# Patient Record
Sex: Male | Born: 1950
Health system: Southern US, Community
[De-identification: ages and names within clinical notes are randomized; demographics above are authoritative.]

## PROBLEM LIST (undated history)

## (undated) DIAGNOSIS — E785 Hyperlipidemia, unspecified: Secondary | ICD-10-CM

## (undated) DIAGNOSIS — F909 Attention-deficit hyperactivity disorder, unspecified type: Secondary | ICD-10-CM

## (undated) DIAGNOSIS — N289 Disorder of kidney and ureter, unspecified: Secondary | ICD-10-CM

## (undated) DIAGNOSIS — R569 Unspecified convulsions: Secondary | ICD-10-CM

## (undated) DIAGNOSIS — I10 Essential (primary) hypertension: Secondary | ICD-10-CM

## (undated) DIAGNOSIS — I639 Cerebral infarction, unspecified: Secondary | ICD-10-CM

## (undated) HISTORY — DX: Attention-deficit hyperactivity disorder, unspecified type: F90.9

## (undated) HISTORY — DX: Hyperlipidemia, unspecified: E78.5

## (undated) HISTORY — PX: SPINE SURGERY: SHX786

## (undated) HISTORY — PX: NECK SURGERY: SHX720

## (undated) HISTORY — DX: Unspecified convulsions: R56.9

---

## 1998-01-19 ENCOUNTER — Ambulatory Visit (HOSPITAL_COMMUNITY): Admission: RE | Admit: 1998-01-19 | Discharge: 1998-01-19 | Payer: Self-pay | Admitting: Otolaryngology

## 2002-01-05 ENCOUNTER — Encounter: Admission: RE | Admit: 2002-01-05 | Discharge: 2002-04-05 | Payer: Self-pay | Admitting: Internal Medicine

## 2002-03-09 ENCOUNTER — Encounter: Payer: Self-pay | Admitting: Emergency Medicine

## 2002-03-09 ENCOUNTER — Inpatient Hospital Stay (HOSPITAL_COMMUNITY): Admission: EM | Admit: 2002-03-09 | Discharge: 2002-03-11 | Payer: Self-pay | Admitting: Emergency Medicine

## 2002-05-11 ENCOUNTER — Ambulatory Visit (HOSPITAL_COMMUNITY): Admission: RE | Admit: 2002-05-11 | Discharge: 2002-05-11 | Payer: Self-pay | Admitting: Gastroenterology

## 2002-05-11 ENCOUNTER — Encounter (INDEPENDENT_AMBULATORY_CARE_PROVIDER_SITE_OTHER): Payer: Self-pay

## 2006-02-22 ENCOUNTER — Ambulatory Visit: Payer: Self-pay

## 2010-01-03 ENCOUNTER — Ambulatory Visit (HOSPITAL_COMMUNITY): Admission: RE | Admit: 2010-01-03 | Discharge: 2010-01-03 | Payer: Self-pay | Admitting: Sports Medicine

## 2010-10-20 NOTE — Discharge Summary (Signed)
   NAME:  Jeff [name], Jeff [name]                              ACCOUNT NO.:  [id number]   MEDICAL RECORD NO.:  [id number]                   PATIENT TYPE:  INP   LOCATION:  0379                                 FACILITY:  [facility]   PHYSICIAN:  Theressa Millard, M.D.                 DATE OF BIRTH:  24-Sep-1950   DATE OF ADMISSION:  03/09/2002  DATE OF DISCHARGE:  03/11/2002                                 DISCHARGE SUMMARY   ADMISSION DIAGNOSIS:  Syncope, possibly secondary to Flagyl.   DISCHARGE DIAGNOSES:  1. Syncope.  2. Diabetes mellitus.  3. Diverticulitis of the colon.   HISTORY OF PRESENT ILLNESS:  Please see the patient's history and physical  examination dated March 09, 2002.  Briefly, the patient had recurrent  episodes of syncope on the day of admission.   HOSPITAL COURSE:  The patient was admitted and was placed on telemetry.  During this hospitalization he had absolutely no evidence of ectopy.  He was  seen in consultation by Dr. Candace Cruise, who recommended the patient undergo  thoracic echo, serial cardiac enzymes, carotid duplex, and consideration of  tilt-table testing.  The laboratory data for evidence of myocardial ischemia  was negative.  Carotid Dopplers were negative.  Transthoracic echo was  entirely normal.  He was transferred [facility] where he underwent tilt-  table testing, at which time his symptoms could not be reproduced.  Therefore, it was a negative tilt-table test.  Upon further evaluation, I  was able to find evidence that Flagyl had been associated with episodes of  syncope.  This is a very rare side effects.  The patient had been placed on  Flagyl and Cipro for presumed diverticulitis just prior to admission.   CONDITION ON DISCHARGE:  Improved.   DISCHARGE MEDICATIONS:  Complete Cipro 500 mg b.i.d. x 3 days.   FOLLOW-UP:  He will call to make an appointment to see me in the office.   ACTIVITY:  As tolerated.  He is not to drive for a period of three weeks,  and then he may resume driving if he has no recurring episodes.                                                Theressa Millard, M.D.    JO/MEDQ  D:  04/29/2002  T:  04/29/2002  Job:  [id number]

## 2010-10-20 NOTE — H&P (Signed)
NAME:  Jeff [name], Jeff [name]                              ACCOUNT NO.:  [id number]   MEDICAL RECORD NO.:  [id number]                   PATIENT TYPE:  INP   LOCATION:  0375                                 FACILITY:  [facility]   PHYSICIAN:  Theressa Millard, M.D.                 DATE OF BIRTH:  02/01/1951   DATE OF ADMISSION:  03/09/2002  DATE OF DISCHARGE:                                HISTORY & PHYSICAL   HISTORY OF PRESENT ILLNESS:  The patient is a 60 year old white male  admitted with syncope.  The history is obtained from the patient, his wife,  and the emergency department physician.   He has recently been diagnosed with diabetes.  He is currently on Amaryl  daily.  He also has had two bouts of diverticulitis in the last few months,  the last one starting about five days prior to admission.  Both times he has  been treated with Cipro and Flagyl, and he is thus five days through a seven  day course of Cipro and Flagyl.   He felt fine today.  He was driving to work and was about a block from his  home when he felt light-headed.  He returned home and remembers unlocking  the door.  The next thing he remembers, his dog was licking his face.  He  got up and called his boss, and told her his neighbor's name.  His neighbor  came over and found his unconscious on the floor.  The next thing he  remembers, he is sitting in his recliner with a number of fireman around  him.  No seizure activity noted.  He was brought to the [facility]  Emergency Department.   While in the ED, his wife was sitting with him.  He was asleep.  She saw his  pulse go down to 42 and called the nurse.  When the nurse awoke the patient,  he was not able to talk, but he could move his extremities.  All those  symptoms resolved.  Head CT was negative.  During the episode in the ER,  systolic blood pressure was noted to be in the 80's, usually 105 to 115.  He  does not recall any of this episode either.  CBG's during  spell were 100 to  150.   PAST MEDICAL HISTORY:  1. Diverticulitis.  2. Hypercholesterolemia.  3. Charcot radiculopathy.  4. Male pattern baldness.   PAST SURGICAL HISTORY:  Cervical laminectomy x2.   MEDICATIONS:  1. Amaryl 1 mg q.d.  2. Cipro 500 mg b.i.d.  3. Flagyl 250 mg t.i.d. x7 days.   ALLERGIES:  NOVOCAINE.   SOCIAL HISTORY:  He does smoke.  He does not consume alcohol.  He is a  Psychologist, occupational.  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  He also  referees football.  He is  married.   FAMILY HISTORY:  Father had hypertension and prostate problems.  Mother died  of heart failure, strokes, and had glaucoma and diabetes.  Two brothers have  had diabetes.  One of those brothers died with a brain tumor.   REVIEW OF SYMPTOMS:  All other systems are negative.   PHYSICAL EXAMINATION:  GENERAL:  Well-developed, well-nourished, in no  distress.  VITAL SIGNS:  Blood pressure 110/70, pulse 72 and regular.  HEENT:  Pupils equal, round, reactive to light.  Extraocular movements were  intact.  Funduscopic examination was unremarkable.  Ears were normal.  Nose  and throat were normal.  NECK:  Supple.  CHEST:  Clear to auscultation and percussion.  CARDIAC:  Regular rate and rhythm with a normal S1 and S2, without a S3, S4,  murmur, rub, or click.  ABDOMEN:  Soft, minimal left lower quadrant tenderness.  EXTREMITIES:  Without cyanosis, clubbing, or edema.   LABORATORY DATA:  CMET was normal except for a blood sugar of 123.  CBC was  normal.  Chest x-ray was normal.  EKG was normal.  Head CT verbal report was  negative.   IMPRESSION:  1. Syncope x3.  Differential diagnoses is a primary bradycardic event versus     neurocardiogenic syncope versus acute ischemia versus side effect to     Flagyl (listed as a potential side effect, but I doubt it is the case).     We will ask for cardiovascular consultation.  We will keep the patient on     telemetry.  2. Diverticulitis.  This  is improving.  I had considered getting a CT scan,     and may decide to get that while he is in the hospital.  We will hold     Flagyl for now.  3. Diabetes mellitus.  Hold Amaryl and follow CBG's.                                               Theressa Millard, M.D.    JO/MEDQ  D:  03/09/2002  T:  03/10/2002  Job:  [id number]

## 2010-10-20 NOTE — Op Note (Signed)
NAME:  Jeff [name], Jeff [name]                              ACCOUNT NO.:  [id number]   MEDICAL RECORD NO.:  [id number]                   PATIENT TYPE:  AMB   LOCATION:  ENDO                                 FACILITY:  [facility]   PHYSICIAN:  Danise Edge, M.D.                DATE OF BIRTH:  1950-10-19   DATE OF PROCEDURE:  05/11/2002  DATE OF DISCHARGE:                                 OPERATIVE REPORT   PROCEDURE:  Colonoscopy and polypectomy.   INDICATIONS FOR PROCEDURE:  [name] is a 60 year old male born 08/17/1950. [name] has completed his course of antibiotic therapy for acute  diverticulitis manifested by severe left lower quadrant abdominal pain. His  symptoms of diverticulitis have resolved. He is scheduled to undergo a  diagnostic colonoscopy with polypectomy to prevent colon cancer.   ENDOSCOPIST:  Charolett Bumpers, M.D.   PREMEDICATION:  Versed 5 mg, Demerol 50 mg .   ENDOSCOPE:  Olympus pediatric colonoscope.   DESCRIPTION OF PROCEDURE:  After obtaining informed consent, Jeff [name] was  placed in the left lateral decubitus position. I administered intravenous  Demerol and intravenous Versed to achieve conscious sedation for the  procedure. The patient's blood pressure, oxygen saturation and cardiac  rhythm were monitored throughout the procedure and documented in the medical  record.   Anal inspection was normal. Digital rectal exam revealed a nonnodular  prostate. The Olympus pediatric video colonoscope was introduced into the  rectum and advanced to the cecum. Colonic preparation for the exam today was  excellent.   RECTUM:  Normal.   SIGMOID COLON AND DESCENDING COLON:  At approximately 60 cm from the anal  verge, a 2 mm sessile polyp was removed with the electrocautery snare. I did  not detect diverticulosis involving the left colon but colonoscopy is the  best way to demonstrate colonic diverticulosis.   SPLENIC FLEXURE:  Normal.   TRANSVERSE COLON:   Normal.   HEPATIC FLEXURE:  Normal.   ASCENDING COLON:  From the mid ascending colon, a 2 mm sessile polyp was  removed with electrocautery snare.   CECUM AND ILEOCECAL VALVE:  Normal.   ASSESSMENT:  1. Resolved acute diverticulitis.  2. A 2 mm sessile polyp was removed from the mid ascending colon and a 2 mm     sessile polyp was removed from the descending colon. Both polyps were     submitted in one bottle for pathological evaluation.   RECOMMENDATIONS:  If polyps return neoplastic pathologically, Jeff [name]  should undergo a repeat colonoscopy in five years.                                               Danise Edge, M.D.    MJ/MEDQ  D:  05/11/2002  T:  05/11/2002  Job:  [id number]   cc:   Theressa Millard, M.D.  [address] [place]  [place] [address]  Fax: [phone]

## 2010-10-20 NOTE — Consult Note (Signed)
NAME:  [name], Jeff                              ACCOUNT NO.:  [id number]   MEDICAL RECORD NO.:  [id number]                   PATIENT TYPE:  INP   LOCATION:  0102                                 FACILITY:  [facility]   PHYSICIAN:  Meade Maw, M.D.                 DATE OF BIRTH:  [birth year]   DATE OF CONSULTATION:  DATE OF DISCHARGE:                                   CONSULTATION   REASON FOR CONSULTATION:  Syncope.   HISTORY OF PRESENT ILLNESS:  Jeff [name] is a very pleasant 60 year old  gentleman who awoke this morning still in his usual state of health, started  off to work and was proceeding to the nursing home where he was dealing with  some issues for his father when he suddenly felt not well, somewhat light  headed and dizzy. He subsequently drove home, called his boss and reported  not feeling well. He unlocked his front door which is unusual for him and  subsequently found himself laying on the floor with the dog licking his ear.  He then notified his boss who notified the neighbors who in turn notified  the EMS. When the EMS arrived on the scene, he was noted to have a blood  pressure in the 150s with a heart rate of 92, this was following a sternal  rub. Following the sternal rub, he became alert, oriented, and conversation  was appropriate. He was able to relate his history to the EMS service. He  subsequently presented to the emergency room and had one further event where  he was somewhat nonresponsive but was dysarthric and at this time he was  noted to have a systolic blood pressure of 87 with a heart rate of 48 beats  per minute. He has had no prior presyncopal episodes. 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 He walks on his treadmill for 40 minutes a day after being  given the diagnosis of diabetes. There was no seizure activity, no bowel or  bladder incontinence noted.   PAST MEDICAL HISTORY:  1.  Significant for diverticulitis recently diagnosed, started on  Flagyl and     Cipro approximately four days prior to this presentation.  2. Adult onset diabetes mellitus.   PAST SURGICAL HISTORY:  Significant for a laminectomy x2.   CURRENT MEDICATIONS:  1. Cipro 500 mg b.i.d.  2. Flagyl 250 mg t.i.d.  3. Amaryl 1 mg daily.   ALLERGIES:  NOVOCAIN.   SOCIAL HISTORY:  He is married, lives with his wife. No history of tobacco,  alcohol or illicit drug use.   FAMILY HISTORY:  Significant for brothers with diabetes and coronary artery  disease. Mother passed at age 34 from CVA and myocardial infarction. Father  is 73 and has adult onset diabetes with kidney problems.  REVIEW OF SYMPTOMS:  He has had no chest pain, no palpitations, no  tachyarrhythmias, no orthopnea, no pedal edema, no unusual dyspnea.   PHYSICAL EXAMINATION:  GENERAL:  Reveals a middle-aged male in no acute  distress.   VITAL SIGNS:  Blood pressure currently is 108 with a heart rate in the 50s.   HEENT:  Unremarkable.  He has good carotid upstrokes. There is no carotid  bruit. His thyroid is not palpable.   PULMONARY:  Reveals breath sounds which are equal and clear to auscultation.   CARDIOVASCULAR:  Reveals a regular rate and rhythm, no rubs, murmurs or  gallops as noted.   ABDOMEN:  Soft, benign, nontender. No unusual bruits or pulsations are  noted.   EXTREMITIES:  Reveal no peripheral edema. Distal pulses are equal and  palpable.   SKIN:  Warm and dry.   NEUROLOGIC:  Nonfocal.   LABORATORY DATA:  CT scan of the head was normal. Chest x-ray revealed no  acute disease. ECG revealing normal sinus rhythm, normal ECG.   CBC reveals a white count of 4.7, hemoglobin 15, hematocrit 44, platelet  count 254, normal electrolytes, creatinine 1.3, CK 104, CK-MB 1.1, troponin  I 0.03.   IMPRESSION:  Recurrent syncope in a 60 year old gentleman without past  medical history of cardiac disease who presented to  the emergency room, his  initial cardiac enzymes are normal. His physical examination is  unremarkable. Do not feel that a systolic blood pressure is 87 with a heart  rate of 47 is sufficient to account for his symptoms. Agreeable observation  and admitting to telemetry is warranted. Will obtain serial cardiac enzymes  as well as a transthoracic echo and carotid duplex to complete the workup.  Strongly suspect the symptoms may be related to his adverse reaction of  Flagyl. This is listed as a side effect. Should the blood test be negative,  a stress Cardiolite may be performed as an outpatient for further cardiac  workup.                                               Meade Maw, M.D.    HP/MEDQ  D:  03/09/2002  T:  03/09/2002  Job:  [id number]

## 2011-05-30 ENCOUNTER — Emergency Department (INDEPENDENT_AMBULATORY_CARE_PROVIDER_SITE_OTHER): Payer: BC Managed Care – PPO

## 2011-05-30 ENCOUNTER — Encounter: Payer: Self-pay | Admitting: Student

## 2011-05-30 ENCOUNTER — Emergency Department (HOSPITAL_BASED_OUTPATIENT_CLINIC_OR_DEPARTMENT_OTHER)
Admission: EM | Admit: 2011-05-30 | Discharge: 2011-05-30 | Disposition: A | Discharge status: Home / Self Care | Payer: BC Managed Care – PPO | Attending: Emergency Medicine | Admitting: Emergency Medicine

## 2011-05-30 ENCOUNTER — Other Ambulatory Visit: Payer: Self-pay

## 2011-05-30 DIAGNOSIS — R059 Cough, unspecified: Secondary | ICD-10-CM | POA: Insufficient documentation

## 2011-05-30 DIAGNOSIS — J189 Pneumonia, unspecified organism: Secondary | ICD-10-CM

## 2011-05-30 DIAGNOSIS — Z79899 Other long term (current) drug therapy: Secondary | ICD-10-CM | POA: Insufficient documentation

## 2011-05-30 DIAGNOSIS — I10 Essential (primary) hypertension: Secondary | ICD-10-CM | POA: Insufficient documentation

## 2011-05-30 DIAGNOSIS — R05 Cough: Secondary | ICD-10-CM | POA: Insufficient documentation

## 2011-05-30 DIAGNOSIS — J111 Influenza due to unidentified influenza virus with other respiratory manifestations: Secondary | ICD-10-CM | POA: Insufficient documentation

## 2011-05-30 DIAGNOSIS — R0602 Shortness of breath: Secondary | ICD-10-CM | POA: Insufficient documentation

## 2011-05-30 DIAGNOSIS — E119 Type 2 diabetes mellitus without complications: Secondary | ICD-10-CM | POA: Insufficient documentation

## 2011-05-30 HISTORY — DX: Essential (primary) hypertension: I10

## 2011-05-30 LAB — BASIC METABOLIC PANEL
BUN: 18 mg/dL (ref 6–23)
CO2: 23 mEq/L (ref 19–32)
Chloride: 96 mEq/L (ref 96–112)
Creatinine, Ser: 1.1 mg/dL (ref 0.50–1.35)
Potassium: 4.3 mEq/L (ref 3.5–5.1)

## 2011-05-30 LAB — CBC
Hemoglobin: 13.7 g/dL (ref 13.0–17.0)
MCH: 28.2 pg (ref 26.0–34.0)
RBC: 4.86 MIL/uL (ref 4.22–5.81)
WBC: 7.2 10*3/uL (ref 4.0–10.5)

## 2011-05-30 LAB — GLUCOSE, CAPILLARY: Glucose-Capillary: 238 mg/dL — ABNORMAL HIGH (ref 70–99)

## 2011-05-30 MED ORDER — SODIUM CHLORIDE 0.9 % IV SOLN: INTRAVENOUS | Status: DC

## 2011-05-30 MED ORDER — MOXIFLOXACIN HCL 400 MG PO TABS: 400.0000 mg | ORAL_TABLET | Freq: Every day | ORAL | Status: AC

## 2011-05-30 MED ORDER — ACETAMINOPHEN 325 MG PO TABS: 650.0000 mg | ORAL_TABLET | Freq: Once | ORAL | Status: DC

## 2011-05-30 MED ORDER — SODIUM CHLORIDE 0.9 % IV BOLUS (SEPSIS)
250.0000 mL | Freq: Once | INTRAVENOUS | Status: AC
  Administered 2011-05-30: 250 mL via INTRAVENOUS

## 2011-05-30 MED ORDER — ACETAMINOPHEN 500 MG PO TABS
1000.0000 mg | ORAL_TABLET | Freq: Once | ORAL | Status: AC
  Administered 2011-05-30: 1000 mg via ORAL

## 2011-05-30 MED ORDER — AZITHROMYCIN 250 MG PO TABS
500.0000 mg | ORAL_TABLET | Freq: Once | ORAL | Status: AC
  Administered 2011-05-30: 500 mg via ORAL
  Filled 2011-05-30: qty 2

## 2011-05-30 MED ORDER — ACETAMINOPHEN 500 MG PO TABS
ORAL_TABLET | ORAL | Status: AC
  Filled 2011-05-30: qty 2

## 2011-05-30 MED ORDER — DEXTROSE 5 % IV SOLN
1.0000 g | Freq: Once | INTRAVENOUS | Status: AC
  Administered 2011-05-30: 1 g via INTRAVENOUS
  Filled 2011-05-30: qty 10

## 2011-05-30 MED ORDER — OSELTAMIVIR PHOSPHATE 75 MG PO CAPS: 75.0000 mg | ORAL_CAPSULE | Freq: Two times a day (BID) | ORAL | Status: AC

## 2011-05-30 NOTE — ED Notes (Signed)
Pt in with c/o extreme SOB wit associated cough and upper chest congestion x 2 days. Pt placed in wheelchair upon arrival and reports feeling very weak and tired. Recent familial exposure to URI. Flu shot +.

## 2011-05-30 NOTE — ED Provider Notes (Signed)
History     CSN: [id number]  Arrival date & time 05/30/11  1206   First MD Initiated Contact with Patient 05/30/11 1207      Chief Complaint  Patient presents with  . Shortness of Breath  . Cough    (Consider location/radiation/quality/duration/timing/severity/associated sxs/prior treatment) The history is provided by the patient.   patient is a 60 year old male has history of diabetes and hypertension. Acute onset yesterday of cough bodyaches fever chest congestion, fatigue, patient was placed in wheelchair upon arrival because he was feeling very weak and tired thought maybe is going to pass out. Patient didt have flu shot this year.  No nausea vomiting or diarrhea.   Past Medical History  Diagnosis Date  . Diabetes mellitus   . Hypertension     Past Surgical History  Procedure Date  . Neck surgery     History reviewed. No pertinent family history.  History  Substance Use Topics  . Smoking status: Never Smoker   . Smokeless tobacco: Not on file  . Alcohol Use: No      Review of Systems  Constitutional: Positive for fever and fatigue.  HENT: Positive for congestion. Negative for neck pain and neck stiffness.   Eyes: Negative for redness.  Respiratory: Positive for cough and shortness of breath. Negative for chest tightness.   Cardiovascular: Negative for chest pain.  Gastrointestinal: Negative for nausea, vomiting, abdominal pain and diarrhea.  Genitourinary: Negative for dysuria and hematuria.  Musculoskeletal: Positive for myalgias. Negative for back pain.  Skin: Negative for rash.  Neurological: Positive for weakness. Negative for headaches.  Hematological: Does not bruise/bleed easily.  Psychiatric/Behavioral: Positive for confusion.    Allergies  Flexeril  Home Medications   Current Outpatient Rx  Name Route Sig Dispense Refill  . GLIPIZIDE 5 MG PO TABS Oral Take 5 mg by mouth 2 (two) times daily before a meal.      . LISINOPRIL 10 MG PO TABS  Oral Take 10 mg by mouth daily.      Marland Kitchen METFORMIN HCL 500 MG PO TABS Oral Take 500 mg by mouth 2 (two) times daily with a meal.      . SITAGLIPTIN PHOSPHATE 25 MG PO TABS Oral Take 25 mg by mouth daily.      Marland Kitchen MOXIFLOXACIN HCL 400 MG PO TABS Oral Take 1 tablet (400 mg total) by mouth daily. 7 tablet 0  . OSELTAMIVIR PHOSPHATE 75 MG PO CAPS Oral Take 1 capsule (75 mg total) by mouth every 12 (twelve) hours. 10 capsule 0    BP 120/72  Pulse 106  Temp(Src) 101.5 F (38.6 C) (Oral)  Resp 24  Wt 165 lb (74.844 kg)  SpO2 100%  Physical Exam  Nursing note and vitals reviewed. Constitutional: He appears well-developed and well-nourished. He appears distressed.  HENT:  Head: Normocephalic and atraumatic.  Mouth/Throat: Oropharynx is clear and moist.  Eyes: Conjunctivae and EOM are normal. Pupils are equal, round, and reactive to light.  Neck: Normal range of motion. Neck supple.  Cardiovascular: Normal rate, regular rhythm and normal heart sounds.   No murmur heard.      Tachycardic  Pulmonary/Chest: Breath sounds normal. He is in respiratory distress. He has no wheezes. He has no rales. He exhibits no tenderness.  Abdominal: Soft. Bowel sounds are normal. There is no tenderness.  Musculoskeletal: Normal range of motion. He exhibits no edema and no tenderness.  Neurological: No cranial nerve deficit. He exhibits normal muscle tone. Coordination normal.  Somnolent  Skin: Skin is warm. No rash noted.    ED Course  Procedures (including critical care time)  Labs Reviewed  BASIC METABOLIC PANEL - Abnormal; Notable for the following:    Sodium 133 (*)    Glucose, Bld 231 (*)    GFR calc non Af Amer 70 (*)    GFR calc Af Amer 81 (*)    All other components within normal limits  GLUCOSE, CAPILLARY - Abnormal; Notable for the following:    Glucose-Capillary 238 (*)    All other components within normal limits  CBC  POCT CBG MONITORING   Dg Chest Portable 1 View  05/30/2011   *RADIOLOGY REPORT*  Clinical Data: Cough and short of breath  PORTABLE CHEST - 1 VIEW  Comparison: None.  Findings: Image quality degraded by patient motion during the exposure.  Mild left lower lobe airspace density may represent atelectasis or pneumonia.  Negative for heart failure or effusion.  IMPRESSION: Mild left lower lobe airspace disease.  Original Report Authenticated By: Camelia Phenes, M.D.      Date: 05/30/2011  Rate: 131  Rhythm: sinus tachycardia  QRS Axis: normal  Intervals: normal  ST/T Wave abnormalities: normal  Conduction Disutrbances:none  Narrative Interpretation:   Old EKG Reviewed: none available  Results for orders placed during the hospital encounter of 05/30/11  BASIC METABOLIC PANEL      Component Value Range   Sodium 133 (*) 135 - 145 (mEq/L)   Potassium 4.3  3.5 - 5.1 (mEq/L)   Chloride 96  96 - 112 (mEq/L)   CO2 23  19 - 32 (mEq/L)   Glucose, Bld 231 (*) 70 - 99 (mg/dL)   BUN 18  6 - 23 (mg/dL)   Creatinine, Ser 2.95  0.50 - 1.35 (mg/dL)   Calcium 9.4  8.4 - 28.4 (mg/dL)   GFR calc non Af Amer 70 (*) >90 (mL/min)   GFR calc Af Amer 81 (*) >90 (mL/min)  GLUCOSE, CAPILLARY      Component Value Range   Glucose-Capillary 238 (*) 70 - 99 (mg/dL)   Comment 1 Notify RN    CBC      Component Value Range   WBC 7.2  4.0 - 10.5 (K/uL)   RBC 4.86  4.22 - 5.81 (MIL/uL)   Hemoglobin 13.7  13.0 - 17.0 (g/dL)   HCT 13.2  44.0 - 10.2 (%)   MCV 80.9  78.0 - 100.0 (fL)   MCH 28.2  26.0 - 34.0 (pg)   MCHC 34.9  30.0 - 36.0 (g/dL)   RDW 72.5  36.6 - 44.0 (%)   Platelets 204  150 - 400 (K/uL)     1. Influenza   2. CAP (community acquired pneumonia)     CRITICAL CARE Performed by: Shelda Jakes.   Total critical care time: 30  Critical care time was exclusive of separately billable procedures and treating other patients.  Critical care was necessary to treat or prevent imminent or life-threatening deterioration.  Critical care was time spent  personally by me on the following activities: development of treatment plan with patient and/or surrogate as well as nursing, discussions with consultants, evaluation of patient's response to treatment, examination of patient, obtaining history from patient or surrogate, ordering and performing treatments and interventions, ordering and review of laboratory studies, ordering and review of radiographic studies, pulse oximetry and re-evaluation of patient's condition.   MDM   Patient arrived with a persistent cough and near-syncope. Acute onset of flulike  symptoms yesterday with fever significant cough congestion body aches. Patient did have flu shot this year. When initially brought back to room patient's oxygen saturation was 92-93% on room air. He was somewhat delirious. Temperature was 103. Chest x-ray reveals a early left-sided pneumonia. Other symptoms are truly consistent with influenza-type illness. Patient was placed on 2 L of oxygen nausea and sats came up to 99 percent. Given IV bolus 250 cc of normal saline and then IV hydration a rate of 100 cc per hour. For the pneumonia which be a community-acquired pneumonia patient has not been in the hospital at all for admission in the last 3 months, patient received IV Rocephin 1 g and by mouth Zithromax. She has a history of diabetes which he takes oral meds for a history of hypertension. Blood sugar here was 2:30 red, no acidosis.  With fluids antibiotics patient had significant improvement, in addition 1 g of Tylenol for the fever. Patient now alert talking normal temperature down, cough significant improved. Patient now clinically well enough to go home oxygen saturation on room air is 96-97%. Patient will be started on Tamiflu and switched over to Avelox for antibiotics as he has diabetes. Patient will return for any new worse symptoms.        Shelda Jakes, MD 05/30/11 [phone]

## 2011-08-28 ENCOUNTER — Telehealth: Payer: Self-pay

## 2011-08-28 NOTE — Telephone Encounter (Signed)
Pt requesting refill on his vyvanse

## 2011-08-29 NOTE — Telephone Encounter (Signed)
Need chart

## 2011-08-30 MED ORDER — LISDEXAMFETAMINE DIMESYLATE 70 MG PO CAPS: 70.0000 mg | ORAL_CAPSULE | ORAL | Status: DC

## 2011-08-30 NOTE — Telephone Encounter (Signed)
Signed at TL desk - remind pt he needs OV 4/13

## 2011-08-30 NOTE — Telephone Encounter (Signed)
Called pt advised wife RX ready to pick up

## 2011-08-30 NOTE — Telephone Encounter (Signed)
Chart pulled to PA 

## 2011-09-25 ENCOUNTER — Other Ambulatory Visit: Payer: Self-pay | Admitting: Internal Medicine

## 2011-10-14 ENCOUNTER — Ambulatory Visit (INDEPENDENT_AMBULATORY_CARE_PROVIDER_SITE_OTHER): Payer: BC Managed Care – PPO | Admitting: Internal Medicine

## 2011-10-14 VITALS — BP 109/69 | HR 92 | Temp 98.1°F | Resp 16 | Ht 68.25 in | Wt 187.6 lb

## 2011-10-14 DIAGNOSIS — F988 Other specified behavioral and emotional disorders with onset usually occurring in childhood and adolescence: Secondary | ICD-10-CM

## 2011-10-14 DIAGNOSIS — E785 Hyperlipidemia, unspecified: Secondary | ICD-10-CM | POA: Insufficient documentation

## 2011-10-14 DIAGNOSIS — Z6828 Body mass index (BMI) 28.0-28.9, adult: Secondary | ICD-10-CM | POA: Insufficient documentation

## 2011-10-14 DIAGNOSIS — Z77098 Contact with and (suspected) exposure to other hazardous, chiefly nonmedicinal, chemicals: Secondary | ICD-10-CM | POA: Insufficient documentation

## 2011-10-14 DIAGNOSIS — Z7739 Contact with and (suspected) exposure to other war theater: Secondary | ICD-10-CM | POA: Insufficient documentation

## 2011-10-14 DIAGNOSIS — E119 Type 2 diabetes mellitus without complications: Secondary | ICD-10-CM

## 2011-10-14 DIAGNOSIS — H9191 Unspecified hearing loss, right ear: Secondary | ICD-10-CM | POA: Insufficient documentation

## 2011-10-14 LAB — POCT CBC
MCH, POC: 29 pg (ref 27–31.2)
MCHC: 34.1 g/dL (ref 31.8–35.4)
MCV: 85 fL (ref 80–97)
MID (cbc): 0.6 (ref 0–0.9)
MPV: 9.5 fL (ref 0–99.8)
POC LYMPH PERCENT: 23.2 %L (ref 10–50)
POC MID %: 6.4 %M (ref 0–12)
Platelet Count, POC: 246 10*3/uL (ref 142–424)
RBC: 4.66 M/uL — AB (ref 4.69–6.13)
RDW, POC: 13.6 %
WBC: 9 10*3/uL (ref 4.6–10.2)

## 2011-10-14 MED ORDER — LISDEXAMFETAMINE DIMESYLATE 70 MG PO CAPS: 70.0000 mg | ORAL_CAPSULE | ORAL | Status: DC

## 2011-10-14 MED ORDER — SITAGLIPTIN PHOSPHATE 100 MG PO TABS: 100.0000 mg | ORAL_TABLET | Freq: Every day | ORAL | Status: DC

## 2011-10-14 MED ORDER — METFORMIN HCL 1000 MG PO TABS: 1000.0000 mg | ORAL_TABLET | Freq: Two times a day (BID) | ORAL | Status: DC

## 2011-10-14 MED ORDER — SIMVASTATIN 20 MG PO TABS: 20.0000 mg | ORAL_TABLET | Freq: Every day | ORAL | Status: DC

## 2011-10-14 MED ORDER — GLIPIZIDE ER 10 MG PO TB24: 10.0000 mg | ORAL_TABLET | Freq: Every day | ORAL | Status: DC

## 2011-10-14 MED ORDER — LISINOPRIL 10 MG PO TABS: 10.0000 mg | ORAL_TABLET | Freq: Every day | ORAL | Status: DC

## 2011-10-14 NOTE — Progress Notes (Signed)
  Subjective:    Patient ID: Jeff [name], male    DOB: 04/03/1951, 61 y.o.   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 He is unable to mount an exercise program and unable to diet. He has had no problems with his medications or side effects from medications. He is out of Vyvance for the past week and has greatly affected his work. Patient Active Problem List  Diagnoses  . Hearing loss in right ear  . Agent orange exposure  . DM (diabetes mellitus)  . ADD (attention deficit disorder)  . Hyperlipidemia     Review of Systems  Constitutional: Negative for activity change, appetite change and unexpected weight change.  Eyes: Negative for visual disturbance.  Respiratory: Negative for chest tightness and shortness of breath.   Cardiovascular: Negative for chest pain, palpitations and leg swelling.  Genitourinary: Negative for frequency and difficulty urinating.  Neurological: Negative for dizziness, light-headedness and numbness.  Psychiatric/Behavioral: Negative for dysphoric mood.       Objective:   Physical Exam  Constitutional: He is oriented to person, place, and time. He appears well-developed and well-nourished.  Eyes: EOM are normal. Pupils are equal, round, and reactive to light.  Neck: No thyromegaly present.  Cardiovascular: Normal rate, regular rhythm and normal heart sounds.   No murmur heard. Pulmonary/Chest: Effort normal.  Neurological: He is alert and oriented to person, place, and time. No cranial nerve deficit.  Psychiatric: He has a normal mood and affect.          Results for orders placed in visit on 10/14/11  POCT CBC      Component Value Range   WBC 9.0  4.6 - 10.2 (K/uL)   Lymph, poc 2.1  0.6 - 3.4    POC LYMPH PERCENT 23.2  10 - 50 (%L)   MID (cbc) 0.6  0 - 0.9    POC MID % 6.4  0 - 12 (%M)   POC  Granulocyte 6.3  2 - 6.9    Granulocyte percent 70.4  37 - 80 (%G)   RBC 4.66 (*) 4.69 - 6.13 (M/uL)   Hemoglobin 13.5 (*) 14.1 - 18.1 (g/dL)   HCT, POC 08.6 (*) 57.8 - 53.7 (%)   MCV 85.0  80 - 97 (fL)   MCH, POC 29.0  27 - 31.2 (pg)   MCHC 34.1  31.8 - 35.4 (g/dL)   RDW, POC 46.9     Platelet Count, POC 246  142 - 424 (K/uL)   MPV 9.5  0 - 99.8 (fL)  POCT GLYCOSYLATED HEMOGLOBIN (HGB A1C)      Component Value Range   Hemoglobin A1C 8.3      Assessment & Plan:   1. 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 ADD (attention deficit disorder)  Vyvanse 70mg  for 3 months  3. Hyperlipidemia  simvastatin (ZOCOR) 20 MG tablet, Lipid panel   Male him lab results CPE at the end of June

## 2011-10-15 LAB — COMPREHENSIVE METABOLIC PANEL
ALT: 23 U/L (ref 0–53)
AST: 16 U/L (ref 0–37)
CO2: 26 mEq/L (ref 19–32)
Calcium: 9.6 mg/dL (ref 8.4–10.5)
Chloride: 102 mEq/L (ref 96–112)
Potassium: 4.9 mEq/L (ref 3.5–5.3)
Sodium: 137 mEq/L (ref 135–145)
Total Protein: 6.5 g/dL (ref 6.0–8.3)

## 2011-10-15 LAB — LIPID PANEL: Triglycerides: 447 mg/dL — ABNORMAL HIGH (ref <150)

## 2011-10-23 ENCOUNTER — Encounter: Payer: Self-pay | Admitting: Internal Medicine

## 2011-10-25 ENCOUNTER — Other Ambulatory Visit: Payer: Self-pay | Admitting: Internal Medicine

## 2012-01-25 ENCOUNTER — Telehealth: Payer: Self-pay

## 2012-01-25 NOTE — Telephone Encounter (Signed)
Can we refill?  Had last eval in May.Jeff KitchenMarland [name]

## 2012-01-25 NOTE — Telephone Encounter (Signed)
Pt is calling to get a lisdexamfetamine (VYVANSE) 70 MG capsule [[id number]] refill. Is hoping for it by tomorrow as he is going out of town on Sunday.  Best [phone]

## 2012-01-27 MED ORDER — LISDEXAMFETAMINE DIMESYLATE 70 MG PO CAPS: 70.0000 mg | ORAL_CAPSULE | ORAL | Status: DC

## 2012-01-27 NOTE — Telephone Encounter (Signed)
Rx printed.  He's overdue for CPE (June) and follow-up.

## 2012-01-27 NOTE — Telephone Encounter (Signed)
LMOM RX ready to pick up, reminded him he needs follow up/CPE

## 2012-03-21 ENCOUNTER — Other Ambulatory Visit: Payer: Self-pay | Admitting: Internal Medicine

## 2012-03-29 ENCOUNTER — Telehealth: Payer: Self-pay

## 2012-03-29 NOTE — Telephone Encounter (Signed)
PT STATES HE USUALLY SEES DR GUEST AND IS DUE FOR A VIVANCE REFILL. STATES HE IS LEAVING TOWN Monday MORNING AND REQUESTS REFILL PRIOR TO LEAVING.   BEST: [phone] OR [phone]  BF

## 2012-03-30 MED ORDER — LISDEXAMFETAMINE DIMESYLATE 70 MG PO CAPS: 70.0000 mg | ORAL_CAPSULE | ORAL | Status: DC

## 2012-03-30 NOTE — Telephone Encounter (Signed)
I have authorized 1 month, but we will not refill again without an office visit.

## 2012-03-30 NOTE — Telephone Encounter (Signed)
Called pt, LMOM to CB to let him know RX ready but needs OV before next refill

## 2012-04-13 ENCOUNTER — Ambulatory Visit (INDEPENDENT_AMBULATORY_CARE_PROVIDER_SITE_OTHER): Payer: BC Managed Care – PPO | Admitting: Internal Medicine

## 2012-04-13 VITALS — BP 117/68 | HR 98 | Temp 98.0°F | Resp 16 | Ht 68.0 in | Wt 191.0 lb

## 2012-04-13 DIAGNOSIS — Z79899 Other long term (current) drug therapy: Secondary | ICD-10-CM

## 2012-04-13 DIAGNOSIS — Z7189 Other specified counseling: Secondary | ICD-10-CM

## 2012-04-13 DIAGNOSIS — E119 Type 2 diabetes mellitus without complications: Secondary | ICD-10-CM

## 2012-04-13 DIAGNOSIS — F988 Other specified behavioral and emotional disorders with onset usually occurring in childhood and adolescence: Secondary | ICD-10-CM

## 2012-04-13 DIAGNOSIS — I10 Essential (primary) hypertension: Secondary | ICD-10-CM

## 2012-04-13 MED ORDER — LISDEXAMFETAMINE DIMESYLATE 70 MG PO CAPS: 70.0000 mg | ORAL_CAPSULE | ORAL | Status: DC

## 2012-04-13 NOTE — Progress Notes (Signed)
  Subjective:    Patient ID: Jeff [name], male    DOB: 1951-01-15, 61 y.o.   MRN: [id number]  HPI Here to refill adhd meds. No side affects, bp well controlled. Functions at work much better on medication. T2D hard for him due to traveling to keep sugar down, eats out all the time.   Review of Systems     Objective:   Physical Exam  Vitals reviewed. Constitutional: He is oriented to person, place, and time. He appears well-developed and well-nourished.  HENT:  Nose: Nose normal.  Eyes: EOM are normal.  Cardiovascular: Normal rate, regular rhythm and normal heart sounds.   Pulmonary/Chest: Effort normal and breath sounds normal.  Neurological: He is alert and oriented to person, place, and time. Coordination normal.  Psychiatric: He has a normal mood and affect.   BP 110/68       Assessment & Plan:  ADD/ RF for 3 months done See Dr. Merla Riches in Dec. As scheduled.

## 2012-04-13 NOTE — Patient Instructions (Addendum)
Attention Deficit Hyperactivity Disorder Attention deficit hyperactivity disorder (ADHD) is a problem with behavior issues based on the way the brain functions (neurobehavioral disorder). It is a common reason for behavior and academic problems in school. CAUSES  The cause of ADHD is unknown in most cases. It may run in families. It sometimes can be associated with learning disabilities and other behavioral problems. SYMPTOMS  There are 3 types of ADHD. The 3 types and some of the symptoms include:  Inattentive  Gets bored or distracted easily.  Loses or forgets things. Forgets to hand in homework.  Has trouble organizing or completing tasks.  Difficulty staying on task.  An inability to organize daily tasks and school work.  Leaving projects, chores, or homework unfinished.  Trouble paying attention or responding to details. Careless mistakes.  Difficulty following directions. Often seems like is not listening.  Dislikes activities that require sustained attention (like chores or homework).  Hyperactive-impulsive  Feels like it is impossible to sit still or stay in a seat. Fidgeting with hands and feet.  Trouble waiting turn.  Talking too much or out of turn. Interruptive.  Speaks or acts impulsively.  Aggressive, disruptive behavior.  Constantly busy or on the go, noisy.  Combined  Has symptoms of both of the above. Often children with ADHD feel discouraged about themselves and with school. They often perform well below their abilities in school. These symptoms can cause problems in home, school, and in relationships with peers. As children get older, the excess motor activities can calm down, but the problems with paying attention and staying organized persist. Most children do not outgrow ADHD but with good treatment can learn to cope with the symptoms. DIAGNOSIS  When ADHD is suspected, the diagnosis should be made by professionals trained in ADHD.  Diagnosis will  include:  Ruling out other reasons for the child's behavior.  The caregivers will check with the child's school and check their medical records.  They will talk to teachers and parents.  Behavior rating scales for the child will be filled out by those dealing with the child on a daily basis. A diagnosis is made only after all information has been considered. TREATMENT  Treatment usually includes behavioral treatment often along with medicines. It may include stimulant medicines. The stimulant medicines decrease impulsivity and hyperactivity and increase attention. Other medicines used include antidepressants and certain blood pressure medicines. Most experts agree that treatment for ADHD should address all aspects of the child's functioning. Treatment should not be limited to the use of medicines alone. Treatment should include structured classroom management. The parents must receive education to address rewarding good behavior, discipline, and limit-setting. Tutoring or behavioral therapy or both should be available for the child. If untreated, the disorder can have long-term serious effects into adolescence and adulthood. HOME CARE INSTRUCTIONS   Often with ADHD there is a lot of frustration among the family in dealing with the illness. There is often blame and anger that is not warranted. This is a life long illness. There is no way to prevent ADHD. In many cases, because the problem affects the family as a whole, the entire family may need help. A therapist can help the family find better ways to handle the disruptive behaviors and promote change. If the child is young, most of the therapist's work is with the parents. Parents will learn techniques for coping with and improving their child's behavior. Sometimes only the child with the ADHD needs counseling. Your caregivers can help   you make these decisions.  Children with ADHD may need help in organizing. Some helpful tips include:  Keep  routines the same every day from wake-up time to bedtime. Schedule everything. This includes homework and playtime. This should include outdoor and indoor recreation. Keep the schedule on the refrigerator or a bulletin board where it is frequently seen. Mark schedule changes as far in advance as possible.  Have a place for everything and keep everything in its place. This includes clothing, backpacks, and school supplies.  Encourage writing down assignments and bringing home needed books.  Offer your child a well-balanced diet. Breakfast is especially important for school performance. Children should avoid drinks with caffeine including:  Soft drinks.  Coffee.  Tea.  However, some older children (adolescents) may find these drinks helpful in improving their attention.  Children with ADHD need consistent rules that they can understand and follow. If rules are followed, give small rewards. Children with ADHD often receive, and expect, criticism. Look for good behavior and praise it. Set realistic goals. Give clear instructions. Look for activities that can foster success and self-esteem. Make time for pleasant activities with your child. Give lots of affection.  Parents are their children's greatest advocates. Learn as much as possible about ADHD. This helps you become a stronger and better advocate for your child. It also helps you educate your child's teachers and instructors if they feel inadequate in these areas. Parent support groups are often helpful. A national group with local chapters is called CHADD (Children and Adults with Attention Deficit Hyperactivity Disorder). PROGNOSIS  There is no cure for ADHD. Children with the disorder seldom outgrow it. Many find adaptive ways to accommodate the ADHD as they mature. SEEK MEDICAL CARE IF:  Your child has repeated muscle twitches, cough or speech outbursts.  Your child has sleep problems.  Your child has a marked loss of  appetite.  Your child develops depression.  Your child has new or worsening behavioral problems.  Your child develops dizziness.  Your child has a racing heart.  Your child has stomach pains.  Your child develops headaches. Document Released: 05/11/2002 Document Revised: 08/13/2011 Document Reviewed: 12/22/2007 [facility] Patient Information 2013 [place], [place]. DASH Diet The DASH diet stands for "Dietary Approaches to Stop Hypertension." It is a healthy eating plan that has been shown to reduce high blood pressure (hypertension) in as little as 14 days, while also possibly providing other significant health benefits. These other health benefits include reducing the risk of breast cancer after menopause and reducing the risk of type 2 diabetes, heart disease, colon cancer, and stroke. Health benefits also include weight loss and slowing kidney failure in patients with chronic kidney disease.  DIET GUIDELINES  Limit salt (sodium). Your diet should contain less than 1500 mg of sodium daily.  Limit refined or processed carbohydrates. Your diet should include mostly whole grains. Desserts and added sugars should be used sparingly.  Include small amounts of heart-healthy fats. These types of fats include nuts, oils, and tub margarine. Limit saturated and trans fats. These fats have been shown to be harmful in the body. CHOOSING FOODS  The following food groups are based on a 2000 calorie diet. See your Registered Dietitian for individual calorie needs. Grains and Grain Products (6 to 8 servings daily)  Eat More Often: Whole-wheat bread, brown rice, whole-grain or wheat pasta, quinoa, popcorn without added fat or salt (air popped).  Eat Less Often: White bread, white pasta, white rice, cornbread. Vegetables (4 to  5 servings daily)  Eat More Often: Fresh, frozen, and canned vegetables. Vegetables may be raw, steamed, roasted, or grilled with a minimal amount of fat.  Eat Less Often/Avoid:  Creamed or fried vegetables. Vegetables in a cheese sauce. Fruit (4 to 5 servings daily)  Eat More Often: All fresh, canned (in natural juice), or frozen fruits. Dried fruits without added sugar. One hundred percent fruit juice ( cup [237 mL] daily).  Eat Less Often: Dried fruits with added sugar. Canned fruit in light or heavy syrup. [employer], Fish, and Poultry (2 servings or less daily. One serving is 3 to 4 oz [85-114 g]).  Eat More Often: Ninety percent or leaner ground beef, tenderloin, sirloin. Round cuts of beef, chicken breast, [place] breast. All fish. Grill, bake, or broil your meat. Nothing should be fried.  Eat Less Often/Avoid: Fatty cuts of meat, [place], or chicken leg, thigh, or wing. Fried cuts of meat or fish. Dairy (2 to 3 servings)  Eat More Often: Low-fat or fat-free milk, low-fat plain or light yogurt, reduced-fat or part-skim cheese.  Eat Less Often/Avoid: Milk (whole, 2%).Whole milk yogurt. Full-fat cheeses. Nuts, Seeds, and Legumes (4 to 5 servings per week)  Eat More Often: All without added salt.  Eat Less Often/Avoid: Salted nuts and seeds, canned beans with added salt. Fats and Sweets (limited)  Eat More Often: Vegetable oils, tub margarines without trans fats, sugar-free gelatin. Mayonnaise and salad dressings.  Eat Less Often/Avoid: Coconut oils, palm oils, butter, stick margarine, cream, half and half, cookies, candy, pie. FOR MORE INFORMATION The Dash Diet Eating Plan: www.dashdiet.org Document Released: 05/10/2011 Document Revised: 08/13/2011 Document Reviewed: 05/10/2011 [facility] Patient Information 2013 [place], [place].

## 2012-05-05 ENCOUNTER — Other Ambulatory Visit: Payer: Self-pay | Admitting: Internal Medicine

## 2012-06-02 ENCOUNTER — Other Ambulatory Visit: Payer: Self-pay | Admitting: Internal Medicine

## 2012-06-16 NOTE — Progress Notes (Signed)
Completed prior auth over the phone for pt's Vyvanse 70 and received approval through 06/25/13. Faxed approval notice to pharmacy.

## 2012-06-19 ENCOUNTER — Telehealth: Payer: Self-pay

## 2012-06-19 NOTE — Telephone Encounter (Signed)
Pt says that his insurance is requiring approval for his insurance he said that [facility] [address] had contacted [place] but they have not heard back from [place] please call [facility] [address] and patients phone number is [phone]

## 2012-06-19 NOTE — Telephone Encounter (Signed)
Vyvanse needs auth/ I advised him this is done and he is to contact pharmacy.

## 2012-07-06 ENCOUNTER — Other Ambulatory Visit: Payer: Self-pay | Admitting: *Deleted

## 2012-07-06 MED ORDER — GLIPIZIDE ER 10 MG PO TB24: 10.0000 mg | ORAL_TABLET | Freq: Every day | ORAL | Status: DC

## 2012-08-01 ENCOUNTER — Ambulatory Visit (INDEPENDENT_AMBULATORY_CARE_PROVIDER_SITE_OTHER): Payer: BC Managed Care – PPO | Admitting: Internal Medicine

## 2012-08-01 ENCOUNTER — Other Ambulatory Visit: Payer: Self-pay | Admitting: Internal Medicine

## 2012-08-01 VITALS — BP 115/76 | HR 89 | Temp 98.4°F | Resp 18 | Ht 68.0 in | Wt 183.0 lb

## 2012-08-01 DIAGNOSIS — IMO0001 Reserved for inherently not codable concepts without codable children: Secondary | ICD-10-CM

## 2012-08-01 DIAGNOSIS — I10 Essential (primary) hypertension: Secondary | ICD-10-CM

## 2012-08-01 DIAGNOSIS — E785 Hyperlipidemia, unspecified: Secondary | ICD-10-CM

## 2012-08-01 DIAGNOSIS — Z789 Other specified health status: Secondary | ICD-10-CM

## 2012-08-01 DIAGNOSIS — F988 Other specified behavioral and emotional disorders with onset usually occurring in childhood and adolescence: Secondary | ICD-10-CM

## 2012-08-01 DIAGNOSIS — E119 Type 2 diabetes mellitus without complications: Secondary | ICD-10-CM

## 2012-08-01 DIAGNOSIS — Z7189 Other specified counseling: Secondary | ICD-10-CM

## 2012-08-01 LAB — POCT CBC
Hemoglobin: 14.7 g/dL (ref 14.1–18.1)
MCH, POC: 28.7 pg (ref 27–31.2)
MPV: 9.3 fL (ref 0–99.8)
POC MID %: 6.3 %M (ref 0–12)
RBC: 5.12 M/uL (ref 4.69–6.13)
WBC: 8.3 10*3/uL (ref 4.6–10.2)

## 2012-08-01 LAB — GLUCOSE, POCT (MANUAL RESULT ENTRY): POC Glucose: 324 mg/dl — AB (ref 70–99)

## 2012-08-01 LAB — POCT GLYCOSYLATED HEMOGLOBIN (HGB A1C): Hemoglobin A1C: 10.1

## 2012-08-01 MED ORDER — GLIPIZIDE ER 10 MG PO TB24: 10.0000 mg | ORAL_TABLET | Freq: Every day | ORAL | Status: DC

## 2012-08-01 MED ORDER — LISDEXAMFETAMINE DIMESYLATE 70 MG PO CAPS: 70.0000 mg | ORAL_CAPSULE | ORAL | Status: DC

## 2012-08-01 MED ORDER — METFORMIN HCL 1000 MG PO TABS: 1000.0000 mg | ORAL_TABLET | Freq: Two times a day (BID) | ORAL | Status: DC

## 2012-08-01 MED ORDER — SIMVASTATIN 20 MG PO TABS: 20.0000 mg | ORAL_TABLET | Freq: Every day | ORAL | Status: DC

## 2012-08-01 MED ORDER — SITAGLIPTIN PHOSPHATE 100 MG PO TABS: 100.0000 mg | ORAL_TABLET | Freq: Every day | ORAL | Status: DC

## 2012-08-01 MED ORDER — LISINOPRIL 10 MG PO TABS: 10.0000 mg | ORAL_TABLET | Freq: Every day | ORAL | Status: DC

## 2012-08-01 NOTE — Patient Instructions (Signed)
Diabetic Retinopathy Having diabetes for a long time, especially if it is not controlled, can damage the light-sensitive membrane at the back of the eye (retina). The disease of the retina caused by diabetes is called diabetic retinopathy. Taking good care of your diabetes helps reduce the risk of developing diabetic retinopathy. Have regular eye exams. Early detection is the key to keeping your eyes healthy. Diabetes can also affect other parts of the eye with vision-threatening results, such as cataracts and a form of glaucoma that is very difficult to treat. SYMPTOMS  In the early stages of diabetic retinopathy, there are often no symptoms. As the condition advances, symptoms may include:  Blurred vision. This may go away when blood glucose (sugar) levels are normal. This type of reversible change in vision is usually due to swelling of the lens.  Moving speck or dark spots (floaters) in your vision. This can be caused by small amounts of blood (hemorrhages) escaping from the blood vessels of the retina.  Missing parts of your field of vision. This can be caused by larger hemorrhages within the tissue of the retina.  Poor night vision.  Poor color vision.  Sudden drop or loss of vision in one eye. This may be caused by a hemorrhage from retinal blood vessels into the cavity of the inside of the eye. You should not wait until you have symptoms. An eye care specialist can start treatment before visual impairment occurs. DIAGNOSIS  Your eye care specialist can detect the diabetic changes in your blood vessels by putting drops in your eyes to enlarge the size of (dilate) your pupils. This allows a bigger "window" through which the caregiver can see the entire inside of your eyes.  TREATMENT   If you have the type of diabetes that requires you to use insulin, your risk of diabetic retinopathy is very high. You should have your eyes checked at least every 6 months.  If you have diabetes that was  diagnosed during childhood or before the age of 20, your risk of diabetic retinopathy is very high. You should have your eyes checked at least every 6 months.  If you have diabetes that is controlled by diet, you should have the dilated eye exam when first diagnosed and yearly thereafter.  If your diabetes is not under good control as measured by your blood glucose levels and other indicators, it is critical that you have your eyes checked even more often. Your caregiver can usually see the problems of diabetic retinopathy developing long before it causes a problem. In many cases, it can be treated to prevent complications. HOME CARE INSTRUCTIONS   Keep blood pressure in goal range.  Keep blood glucose in target range.  Follow your caregiver's orders regarding diet and other means for controlling your blood glucose levels.  Check both your urine and blood levels for glucose as recommended by your caregiver. SEEK MEDICAL CARE IF:   You notice gradual blurring or other changes in your vision over time.  You notice that your glasses or contact lenses do not make things look as sharp as they once did.  You have trouble reading or seeing details at a distance with either eye. SEEK IMMEDIATE MEDICAL CARE IF:   You notice a sudden change in your vision or parts of your field of vision appear missing or hazy. This may mean you have lost some vision. Get help right away to prevent further vision loss.  You suddenly see moving specks or dark spots   in the field of vision of either eye.  You have a sudden partial or total loss of vision in either eye. Document Released: 05/18/2000 Document Revised: 08/13/2011 Document Reviewed: 02/09/2009 [employer] Patient Information 2013 [employer].  

## 2012-08-01 NOTE — Progress Notes (Signed)
  Subjective:    Patient ID: Jeff [name], male    DOB: 1951-04-02, 62 y.o.   MRN: [id number]  HPI Ran out of all meds for over 1 month due to work schedule. Missed his complete physical with Dr. Merla Riches. Has not been under good control with diabetes, htn has been good.   Review of Systems     Objective:   Physical Exam  Vitals reviewed. Constitutional: He is oriented to person, place, and time. He appears well-developed and well-nourished. No distress.  Eyes: EOM are normal. Pupils are equal, round, and reactive to light.  Cardiovascular: Normal rate, regular rhythm and normal heart sounds.   Pulmonary/Chest: Effort normal and breath sounds normal.  Neurological: He is alert and oriented to person, place, and time. He exhibits normal muscle tone. Coordination normal.  Psychiatric: He has a normal mood and affect.    Results for orders placed in visit on 08/01/12  GLUCOSE, POCT (MANUAL RESULT ENTRY)      Result Value Range   POC Glucose 324 (*) 70 - 99 mg/dl  POCT GLYCOSYLATED HEMOGLOBIN (HGB A1C)      Result Value Range   Hemoglobin A1C 10.1    POCT CBC      Result Value Range   WBC 8.3  4.6 - 10.2 K/uL   Lymph, poc 2.4  0.6 - 3.4   POC LYMPH PERCENT 28.9  10 - 50 %L   MID (cbc) 0.5  0 - 0.9   POC MID % 6.3  0 - 12 %M   POC Granulocyte 5.4  2 - 6.9   Granulocyte percent 64.8  37 - 80 %G   RBC 5.12  4.69 - 6.13 M/uL   Hemoglobin 14.7  14.1 - 18.1 g/dL   HCT, POC 40.9  81.1 - 53.7 %   MCV 88.1  80 - 97 fL   MCH, POC 28.7  27 - 31.2 pg   MCHC 32.6  31.8 - 35.4 g/dL   RDW, POC 91.4     Platelet Count, POC 308  142 - 424 K/uL   MPV 9.3  0 - 99.8 fL         Assessment & Plan:  Must get CPE with Dr. Merla Riches very soon. RF meds 3 months

## 2012-08-02 LAB — COMPREHENSIVE METABOLIC PANEL
ALT: 45 U/L (ref 0–53)
CO2: 25 mEq/L (ref 19–32)
Calcium: 10.2 mg/dL (ref 8.4–10.5)
Chloride: 96 mEq/L (ref 96–112)
Creat: 1.18 mg/dL (ref 0.50–1.35)
Total Protein: 7 g/dL (ref 6.0–8.3)

## 2012-08-02 LAB — LIPID PANEL
Cholesterol: 246 mg/dL — ABNORMAL HIGH (ref 0–200)
Total CHOL/HDL Ratio: 7 Ratio

## 2012-08-06 ENCOUNTER — Encounter: Payer: Self-pay | Admitting: Radiology

## 2012-08-28 ENCOUNTER — Other Ambulatory Visit: Payer: Self-pay | Admitting: Internal Medicine

## 2012-08-29 ENCOUNTER — Other Ambulatory Visit: Payer: Self-pay | Admitting: Internal Medicine

## 2012-11-03 ENCOUNTER — Other Ambulatory Visit: Payer: Self-pay | Admitting: Internal Medicine

## 2012-11-29 ENCOUNTER — Other Ambulatory Visit: Payer: Self-pay | Admitting: Physician Assistant

## 2012-12-11 ENCOUNTER — Telehealth: Payer: Self-pay

## 2012-12-11 NOTE — Telephone Encounter (Signed)
Pt requesting refill on vyvanse  bf

## 2012-12-12 NOTE — Telephone Encounter (Signed)
Please work with Dr. Merla Riches on this.

## 2012-12-15 ENCOUNTER — Ambulatory Visit (INDEPENDENT_AMBULATORY_CARE_PROVIDER_SITE_OTHER): Payer: BC Managed Care – PPO | Admitting: Internal Medicine

## 2012-12-15 VITALS — BP 110/68 | HR 77 | Temp 98.0°F | Resp 16 | Ht 68.0 in | Wt 179.0 lb

## 2012-12-15 DIAGNOSIS — Z7189 Other specified counseling: Secondary | ICD-10-CM

## 2012-12-15 DIAGNOSIS — R12 Heartburn: Secondary | ICD-10-CM

## 2012-12-15 DIAGNOSIS — F988 Other specified behavioral and emotional disorders with onset usually occurring in childhood and adolescence: Secondary | ICD-10-CM

## 2012-12-15 DIAGNOSIS — R290 Tetany: Secondary | ICD-10-CM

## 2012-12-15 DIAGNOSIS — E119 Type 2 diabetes mellitus without complications: Secondary | ICD-10-CM

## 2012-12-15 DIAGNOSIS — I10 Essential (primary) hypertension: Secondary | ICD-10-CM

## 2012-12-15 DIAGNOSIS — E785 Hyperlipidemia, unspecified: Secondary | ICD-10-CM

## 2012-12-15 MED ORDER — LISDEXAMFETAMINE DIMESYLATE 70 MG PO CAPS: 70.0000 mg | ORAL_CAPSULE | ORAL | Status: DC

## 2012-12-15 NOTE — Progress Notes (Signed)
  Subjective:    Patient ID: Jeff [name], male    DOB: [birth year], 62 y.o.   MRN: [id number]  HPIhere for f/u ADD doing well on vyvanse as in past-no side effects  DM reck but wants to waiut til fri for labs Has lost 8-10 lbs -4 belt sizes  Having episodes of hands drawingx4 Also some sweating spells//occas bl vis  Current outpatient prescriptions: glipiZIDE (GLUCOTROL XL) 10 MG 24 hr tablet, Take 1 tablet (10 mg total) by mouth daily. Needs office visit, Disp: 90 tablet, Rfl: 1;   JANUVIA 100 MG tablet, TAKE 1 TABLET BY MOUTH EVERY DAY, Disp: 15 tablet, Rfl: 0;  lisdexamfetamine (VYVANSE) 70 MG capsule, Take 1 capsule (70 mg total) by mouth every morning., Disp: 30 capsule, Rfl: 0 lisinopril (PRINIVIL,ZESTRIL) 10 MG tablet, TAKE 1 TABLET BY MOUTH EVERY DAY FOR HIGH BLOOD PRESSURE, Disp: 90 tablet, Rfl: 0;   metFORMIN (GLUCOPHAGE) 1000 MG tablet, TAKE 1 TABLET BY MOUTH TWICE DAILY FOR DIABETES, Disp: 180 tablet, Rfl: 0;   simvastatin (ZOCOR) 20 MG tablet, Take 1 tablet (20 mg total) by mouth at bedtime., Disp: 90 tablet, Rfl: 1 lisdexamfetamine (VYVANSE) 70 MG capsule, Take 1 capsule (70 mg total) by mouth every morning., Disp: 30 capsule, Rfl: 0;    Review of Systems No chest pain or palpit No HA or other vis chg No weakness or tremors No ca, po4, or mg issues or intake No muscle complaints    Objective:   Physical Exam BP 110/68  Pulse 77  Temp(Src) 98 F (36.7 C) (Oral)  Resp 16  Ht 5\' 8"  (1.727 m)  Wt 179 lb (81.194 kg)  BMI 27.22 kg/m2  SpO2 96% HEENT clear Ht reg Cn 2-12 intact No peripheral sensory or motor losses Gait normal Negative chovsteks and Trousseau      Assessment & Plan:  Problem #1 attention deficit disorder Problem #2 diabetes Problem #3 recent weight loss on purpose Problem #4 episodes of carpopedal spasm Problem #5 hyperlipidemia  He'll return for lab work in one week to include calcium magnesium Meds ordered this encounter  Medications  .  lisdexamfetamine (VYVANSE) 70 MG capsule    Sig: Take 1 capsule (70 mg total) by mouth every morning.    Dispense:  30 capsule    Refill:  0    For 06/13/12  . lisdexamfetamine (VYVANSE) 70 MG capsule    Sig: Take 1 capsule (70 mg total) by mouth every morning.    Dispense:  30 capsule    Refill:  0    For 04/13/12  . lisdexamfetamine (VYVANSE) 70 MG capsule    Sig: Take 1 capsule (70 mg total) by mouth every morning.    Dispense:  30 capsule    Refill:  0    For 05/13/12

## 2012-12-15 NOTE — Patient Instructions (Addendum)

## 2012-12-15 NOTE — Telephone Encounter (Signed)
Patient advised office visit needed for Vyvanse Rx.

## 2012-12-17 ENCOUNTER — Other Ambulatory Visit: Payer: Self-pay | Admitting: Physician Assistant

## 2012-12-17 ENCOUNTER — Other Ambulatory Visit: Payer: Self-pay | Admitting: Internal Medicine

## 2012-12-17 MED ORDER — ACCU-CHEK COMPACT PLUS CARE KIT: PACK | Status: DC

## 2012-12-17 MED ORDER — GLUCOSE BLOOD VI STRP: ORAL_STRIP | Status: DC

## 2012-12-17 NOTE — Addendum Note (Signed)
Addended by: Eddie Candle on: 12/17/2012 08:06 PM   Modules accepted: Orders

## 2013-02-24 ENCOUNTER — Other Ambulatory Visit: Payer: Self-pay | Admitting: Internal Medicine

## 2013-03-09 ENCOUNTER — Ambulatory Visit (INDEPENDENT_AMBULATORY_CARE_PROVIDER_SITE_OTHER): Payer: BC Managed Care – PPO | Admitting: Physician Assistant

## 2013-03-09 VITALS — BP 104/70 | HR 78 | Temp 97.5°F | Resp 16 | Ht 68.0 in | Wt 182.2 lb

## 2013-03-09 DIAGNOSIS — F988 Other specified behavioral and emotional disorders with onset usually occurring in childhood and adolescence: Secondary | ICD-10-CM

## 2013-03-09 DIAGNOSIS — IMO0001 Reserved for inherently not codable concepts without codable children: Secondary | ICD-10-CM

## 2013-03-09 DIAGNOSIS — IMO0002 Reserved for concepts with insufficient information to code with codable children: Secondary | ICD-10-CM

## 2013-03-09 DIAGNOSIS — E1165 Type 2 diabetes mellitus with hyperglycemia: Secondary | ICD-10-CM

## 2013-03-09 DIAGNOSIS — Z23 Encounter for immunization: Secondary | ICD-10-CM

## 2013-03-09 DIAGNOSIS — E7800 Pure hypercholesterolemia, unspecified: Secondary | ICD-10-CM

## 2013-03-09 DIAGNOSIS — I10 Essential (primary) hypertension: Secondary | ICD-10-CM

## 2013-03-09 DIAGNOSIS — R21 Rash and other nonspecific skin eruption: Secondary | ICD-10-CM

## 2013-03-09 DIAGNOSIS — Z789 Other specified health status: Secondary | ICD-10-CM

## 2013-03-09 MED ORDER — CETIRIZINE HCL 10 MG PO TABS: 10.0000 mg | ORAL_TABLET | Freq: Every day | ORAL | Status: DC

## 2013-03-09 MED ORDER — GLIPIZIDE ER 10 MG PO TB24: 10.0000 mg | ORAL_TABLET | Freq: Every day | ORAL | Status: DC

## 2013-03-09 MED ORDER — SITAGLIPTIN PHOSPHATE 100 MG PO TABS: 100.0000 mg | ORAL_TABLET | Freq: Every day | ORAL | Status: DC

## 2013-03-09 MED ORDER — LISDEXAMFETAMINE DIMESYLATE 70 MG PO CAPS: 70.0000 mg | ORAL_CAPSULE | ORAL | Status: DC

## 2013-03-09 MED ORDER — LISINOPRIL 10 MG PO TABS: 10.0000 mg | ORAL_TABLET | Freq: Every day | ORAL | Status: DC

## 2013-03-09 MED ORDER — TYPHOID VACCINE PO CPDR: 1.0000 | DELAYED_RELEASE_CAPSULE | ORAL | Status: AC

## 2013-03-09 MED ORDER — METFORMIN HCL 1000 MG PO TABS: 1000.0000 mg | ORAL_TABLET | Freq: Two times a day (BID) | ORAL | Status: DC

## 2013-03-09 MED ORDER — SIMVASTATIN 20 MG PO TABS: 20.0000 mg | ORAL_TABLET | Freq: Every day | ORAL | Status: DC

## 2013-03-09 MED ORDER — TRIAMCINOLONE ACETONIDE 0.1 % EX LOTN: TOPICAL_LOTION | Freq: Two times a day (BID) | CUTANEOUS | Status: DC

## 2013-03-09 NOTE — Progress Notes (Signed)
   [address] [place] [address]   Phone [phone]  Subjective:    Patient ID: Jeff [name], male    DOB: 1951/04/28, 61 y.o.   MRN: [id number]  HPI Pt presents to clinic with multiple concerns -needs Vyvanse for the month of October - he is leaving for [place] in 1 week and he will run out while he is there - he plans on seeing Dr Merla Riches after he returns. 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   Review of Systems  Cardiovascular: Negative for chest pain.  Skin: Negative for wound.       Objective:   Physical Exam  Vitals reviewed. Constitutional: He is oriented to person, place, and time. He appears well-developed and well-nourished.  HENT:  Head: Normocephalic and atraumatic.  Right Ear: External ear normal.  Left Ear: External ear normal.  Eyes: Conjunctivae are normal.  Neck: Normal range of motion.  Cardiovascular: Normal rate, regular rhythm and normal heart sounds.   No murmur heard. Pulmonary/Chest: Effort normal and breath sounds normal.  Neurological: He is alert and oriented to person, place, and time.  Skin: Skin is warm and dry. Rash (erythematous excoriated rash on chest - seems papular ) noted.  No skin lesions.  Sensation intact B feet.  Psychiatric: He has a normal mood and affect. His behavior is normal. Judgment and thought content normal.       Assessment & Plan:  Diabetes type 2, controlled - Pt will continue his medications - continue lifestyle changes - Plan: POCT glycosylated hemoglobin (Hb  A1C), Comprehensive metabolic panel, glipiZIDE (GLUCOTROL XL) 10 MG 24 hr tablet, sitaGLIPtin (JANUVIA) 100 MG tablet, metFORMIN (GLUCOPHAGE) 1000 MG tablet  HTN (hypertension) - Plan: Comprehensive metabolic panel, lisinopril (PRINIVIL,ZESTRIL) 10 MG tablet  Pure hypercholesterolemia - will check labs and then change meds if needed - Plan: Comprehensive metabolic panel, Lipid panel, simvastatin (ZOCOR) 20 MG tablet  Foreign travel - Plan: Tdap vaccine greater than or equal to 7yo IM, typhoid (VIVOTIF) DR capsule - reviewed recommendations on CDC website - we did not do Hep A due to timing of trip  ADD (attention deficit disorder) - Pt knows he must f/u with Dr Merla Riches for his next Rx.  Plan: lisdexamfetamine (VYVANSE) 70 MG capsule  Rash and nonspecific skin eruption - Plan: triamcinolone lotion (KENALOG) 0.1 %, cetirizine (ZYRTEC) 10 MG tablet  Need for prophylactic vaccination and inoculation against influenza - Plan: Flu vaccine greater than or equal to 3yo preservative free IM  RTC 3 months for recheck DM, HTN and hypercholesterol  Benny Lennert PA-C 03/09/2013 8:56 PM

## 2013-03-10 LAB — COMPREHENSIVE METABOLIC PANEL
Albumin: 4.6 g/dL (ref 3.5–5.2)
Alkaline Phosphatase: 55 U/L (ref 39–117)
BUN: 18 mg/dL (ref 6–23)
CO2: 26 mEq/L (ref 19–32)
Calcium: 10 mg/dL (ref 8.4–10.5)
Chloride: 100 mEq/L (ref 96–112)
Glucose, Bld: 69 mg/dL — ABNORMAL LOW (ref 70–99)
Potassium: 4.5 mEq/L (ref 3.5–5.3)
Sodium: 134 mEq/L — ABNORMAL LOW (ref 135–145)
Total Protein: 7.2 g/dL (ref 6.0–8.3)

## 2013-03-10 LAB — LIPID PANEL
Cholesterol: 232 mg/dL — ABNORMAL HIGH (ref 0–200)
LDL Cholesterol: 141 mg/dL — ABNORMAL HIGH (ref 0–99)
Triglycerides: 249 mg/dL — ABNORMAL HIGH (ref <150)
VLDL: 50 mg/dL — ABNORMAL HIGH (ref 0–40)

## 2013-03-30 ENCOUNTER — Telehealth: Payer: Self-pay

## 2013-03-30 DIAGNOSIS — E7800 Pure hypercholesterolemia, unspecified: Secondary | ICD-10-CM

## 2013-03-30 DIAGNOSIS — E1165 Type 2 diabetes mellitus with hyperglycemia: Secondary | ICD-10-CM

## 2013-03-30 DIAGNOSIS — IMO0002 Reserved for concepts with insufficient information to code with codable children: Secondary | ICD-10-CM

## 2013-03-30 MED ORDER — GLIPIZIDE ER 5 MG PO TB24: 5.0000 mg | ORAL_TABLET | Freq: Every day | ORAL | Status: DC

## 2013-03-30 MED ORDER — SIMVASTATIN 40 MG PO TABS: 40.0000 mg | ORAL_TABLET | Freq: Every day | ORAL | Status: DC

## 2013-03-30 NOTE — Telephone Encounter (Signed)
Pt is calling back about his lab results  Call back number is [phone]

## 2013-03-30 NOTE — Telephone Encounter (Signed)
See labs 

## 2013-03-30 NOTE — Telephone Encounter (Signed)
Pt definitely wants to decrease his Glucotrol and can we send in the new rx for that and the Zocor. Just picked up 90d supply and will double the amount til he runs out. [facility] [address]

## 2013-03-30 NOTE — Telephone Encounter (Signed)
Sent in Glucotrol Xl 5mg  for 3 months supply.  I went ahead and sent the new dose of Zocor to the pharmacy but had them hold the Rx and then when he needs it in 1/5 months he cal call and get them to fill that one.

## 2013-03-31 NOTE — Telephone Encounter (Signed)
Called to advise.  

## 2013-05-02 ENCOUNTER — Telehealth: Payer: Self-pay

## 2013-05-02 DIAGNOSIS — F988 Other specified behavioral and emotional disorders with onset usually occurring in childhood and adolescence: Secondary | ICD-10-CM

## 2013-05-02 MED ORDER — LISDEXAMFETAMINE DIMESYLATE 70 MG PO CAPS: 70.0000 mg | ORAL_CAPSULE | ORAL | Status: DC

## 2013-05-02 NOTE — Telephone Encounter (Signed)
Will give patient a 2 week supply of meds until he can get from his out of town trip to see Dr Merla Riches.

## 2013-05-02 NOTE — Telephone Encounter (Signed)
Pt stated out of medication,need refill for Vyvanse, Pt is going out of town tomorrow morning,need medication asap

## 2013-05-17 ENCOUNTER — Ambulatory Visit (INDEPENDENT_AMBULATORY_CARE_PROVIDER_SITE_OTHER): Payer: BC Managed Care – PPO | Admitting: Internal Medicine

## 2013-05-17 VITALS — BP 120/74 | HR 97 | Temp 99.2°F | Resp 17 | Ht 68.0 in | Wt 181.0 lb

## 2013-05-17 DIAGNOSIS — Z7189 Other specified counseling: Secondary | ICD-10-CM

## 2013-05-17 DIAGNOSIS — F988 Other specified behavioral and emotional disorders with onset usually occurring in childhood and adolescence: Secondary | ICD-10-CM

## 2013-05-17 MED ORDER — LISDEXAMFETAMINE DIMESYLATE 70 MG PO CAPS: 70.0000 mg | ORAL_CAPSULE | ORAL | Status: DC

## 2013-05-17 NOTE — Progress Notes (Signed)
Subjective:  This chart was scribed for Jeff Sia, MD at [facility] by Jeff [name], Scribe. This pt's care was started at 3:57 PM.   Patient ID: Jeff [name], male    DOB: 1950-07-11, 62 y.o.   MRN: [id number]  Chief Complaint  Patient presents with   Medication Refill   Patient Active Problem List   Diagnosis Date Noted   Hearing loss in right ear 10/14/2011   Agent orange exposure 10/14/2011   DM (diabetes mellitus) on 10/14/11-  Improved A1C and continual monitoring of his diet on 05/17/13.     ADD (attention deficit disorder) on 10/14/11- The pt requests a renewal of Vyvanse on 05/17/13    Hyperlipidemia 10/14/2011   BMI 28.0-28.9,adult 10/14/2011    HPI  Jeff [name] is a 62 y.o. male, with a h/o DM and HTN,  who presents to [facility] for a medication refill of Vyvanse. He states he bit down a fingernail while out-of-town in [place], [place] due to increased hyperactivity because he ran out of Vyvanse. He denies any issues associated with Vyvanse. The pt wonders if he is [name] to use a discount for Vyvanse. The pt states he has had better outcomes with Vyvanse than he did with Adderall.   The pt is unsure why his DM medication was changed at the last visit.   Past Medical History  Diagnosis Date   Diabetes mellitus    Hypertension    Current Outpatient Prescriptions on File Prior to Visit  Medication Sig Dispense Refill   Blood Glucose Monitoring Suppl (ACCU-CHEK COMPACT CARE KIT) KIT Check glucose twice daily  1 each  1   glipiZIDE (GLUCOTROL XL) 5 MG 24 hr tablet Take 1 tablet (5 mg total) by mouth daily.  90 tablet  0   glucose blood (ACCU-CHEK COMPACT PLUS) test strip Use as instructed  100 each  12   lisdexamfetamine (VYVANSE) 70 MG capsule Take 1 capsule (70 mg total) by mouth every morning.  30 capsule  0   lisdexamfetamine (VYVANSE) 70 MG capsule Take 1 capsule (70 mg total) by mouth every morning.  30 capsule  0   lisdexamfetamine  (VYVANSE) 70 MG capsule Take 1 capsule (70 mg total) by mouth every morning.  14 capsule  0   lisinopril (PRINIVIL,ZESTRIL) 10 MG tablet Take 1 tablet (10 mg total) by mouth daily.  90 tablet  0   metFORMIN (GLUCOPHAGE) 1000 MG tablet Take 1 tablet (1,000 mg total) by mouth 2 (two) times daily with a meal.  180 tablet  0   simvastatin (ZOCOR) 40 MG tablet Take 1 tablet (40 mg total) by mouth at bedtime.  90 tablet  0   sitaGLIPtin (JANUVIA) 100 MG tablet Take 1 tablet (100 mg total) by mouth daily.  90 tablet  0   No current facility-administered medications on file prior to visit.    Review of Systems  Constitutional: Negative for fatigue.  Eyes: Negative for visual disturbance.  Cardiovascular: Negative for chest pain and palpitations.  Neurological: Negative for headaches.  Psychiatric/Behavioral: Negative for sleep disturbance.       Objective:   Physical Exam  Nursing note and vitals reviewed. Constitutional: He is oriented to person, place, and time. He appears well-developed and well-nourished. No distress.  HENT:  Head: Normocephalic and atraumatic.  Eyes: EOM are normal. Pupils are equal, round, and reactive to light.  Neck: Neck supple.  Cardiovascular: Normal rate.   Pulmonary/Chest: Effort normal. No respiratory distress.  Musculoskeletal: Normal range of motion.  Neurological: He is alert and oriented to person, place, and time. No cranial nerve deficit.  Skin: Skin is warm and dry.  Psychiatric: He has a normal mood and affect. His behavior is normal.    Filed Vitals:   05/17/13 1530  BP: 120/74  Pulse: 97  Temp: 99.2 F (37.3 C)  TempSrc: Oral  Resp: 17  Height: 5\' 8"  (1.727 m)  Weight: 181 lb (82.101 kg)  SpO2: 97%      Assessment & Plan:   4:16 PM- Discussed treatment plan with patient, and the patient agreed to the plan.  ADD (attention deficit disorder) - Plan: lisdexamfetamine (VYVANSE) 70 MG capsule, lisdexamfetamine (VYVANSE) 70 MG capsule,  lisdexamfetamine (VYVANSE) 70 MG capsule  Encounter for medication review and counseling - Plan: lisdexamfetamine (VYVANSE) 70 MG capsule, lisdexamfetamine (VYVANSE) 70 MG capsule   Meds ordered this encounter  Medications   lisdexamfetamine (VYVANSE) 70 MG capsule    Sig: Take 1 capsule (70 mg total) by mouth every morning.    Dispense:  31 capsule    Refill:  0   lisdexamfetamine (VYVANSE) 70 MG capsule    Sig: Take 1 capsule (70 mg total) by mouth every morning.    Dispense:  31 capsule    Refill:  0    For 30 days after date signed   lisdexamfetamine (VYVANSE) 70 MG capsule    Sig: Take 1 capsule (70 mg total) by mouth every morning.    Dispense:  31 capsule    Refill:  0    For 60 days after date signed    Order Specific Question:  Supervising Provider    Answer:  Merla Riches, ROBERT P [[id number]]   F/u 3 mos

## 2013-05-22 ENCOUNTER — Other Ambulatory Visit: Payer: Self-pay | Admitting: Physician Assistant

## 2013-06-09 ENCOUNTER — Other Ambulatory Visit: Payer: Self-pay | Admitting: Physician Assistant

## 2013-06-27 ENCOUNTER — Other Ambulatory Visit: Payer: Self-pay | Admitting: Physician Assistant

## 2013-07-14 ENCOUNTER — Telehealth: Payer: Self-pay

## 2013-07-14 NOTE — Telephone Encounter (Signed)
PATIENT STATES HIS PHARMACY [facility]) WAS SENT A LETTER STATING HE NEEDED A PRE-AUTHORIZATION FOR VYVANSE 70 MG BEFORE HE COULD GET IT REFILLED. HIS DOCTOR IS DR. Merla RichesOLITTLE. BEST PHONE [phone] - PLEASE CALL [name] WHEN THE AUTHORIZATION HAS BEEN COMPLETED.  MBC

## 2013-07-15 NOTE — Telephone Encounter (Signed)
Called [facility] pharmacy to have them resend prior authorization request. Upon speaking with pharmacist he said that since he didn't have a active script he would be unable to resend the prior authorization . He gave me the information.   [phone]  His ID: [id number]... Also noted that there was a prior authorization done for same medication back in January.

## 2013-07-15 NOTE — Telephone Encounter (Signed)
[facility] The patient has arrived with the prescription for Vyvanse, and they have faxed the prior authorization form, which I am unable to locate at present.  I called the insurance company and spoke with Alcario DroughtErica, prior authorization representative.  Approved. Confirmation #[id number] good from 06/24/13-07/15/2014.  [facility] the [facility], and she states that the prescription went through.

## 2013-09-05 ENCOUNTER — Ambulatory Visit (INDEPENDENT_AMBULATORY_CARE_PROVIDER_SITE_OTHER): Payer: BC Managed Care – PPO | Admitting: Family Medicine

## 2013-09-05 VITALS — BP 84/56 | HR 105 | Temp 97.5°F | Resp 18 | Ht 68.0 in | Wt 186.2 lb

## 2013-09-05 DIAGNOSIS — M79609 Pain in unspecified limb: Secondary | ICD-10-CM

## 2013-09-05 DIAGNOSIS — I959 Hypotension, unspecified: Secondary | ICD-10-CM

## 2013-09-05 DIAGNOSIS — S61209A Unspecified open wound of unspecified finger without damage to nail, initial encounter: Secondary | ICD-10-CM

## 2013-09-05 DIAGNOSIS — E119 Type 2 diabetes mellitus without complications: Secondary | ICD-10-CM

## 2013-09-05 LAB — GLUCOSE, POCT (MANUAL RESULT ENTRY): POC Glucose: 172 mg/dl — AB (ref 70–99)

## 2013-09-05 NOTE — Progress Notes (Signed)
Subjective: Patient was washing dishes and dropped a [employer] where a little bit with soapy. He was holding it is it smashed into the stainless steel sink. 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 He went and drank something and felt fine afterwards.  Objective: Small V-shaped wound of fourth finger about 1 cm. He is bleeding a good deal. He has a very superficial 1 cm wound across the tip of the third finger.  Chest clear. Heart regular without murmurs. Recheck blood pressure and numerous times. His systolic ranged from 76 one time to 86 and 90 several times. He was given fluids. He felt fine. Review of systems had no chest pains or palpitations. EKG was done and appeared entirely normal Glucose 172  Assessment: Wound third finger and fourth finger Hypotension, probably secondary to his lisinopril him asymptomatic Diabetes   Plan: Hold lisinopril Recheck in one week Eat more salt Cmet pending Cautioned him and his wife if he becomes symptomatic at all to call 911 and go to the emergency room Sutures out in one week

## 2013-09-05 NOTE — Patient Instructions (Addendum)
WOUND CARE - keep the finger dry for at least 24h and 48h would be better Please return in 7 days to have your stitches/staples removed or sooner if you have concerns. Marland Kitchen. Keep area clean and dry for 24 hours. Do not remove bandage, if applied. . After 24 hours, remove bandage and wash wound gently with mild soap and warm water. Reapply a new bandage after cleaning wound, if directed. . Continue daily cleansing with soap and water until stitches/staples are removed. . Do not apply any ointments or creams to the wound while stitches/staples are in place, as this may cause delayed healing. . Notify the office if you experience any of the following signs of infection: Swelling, redness, pus drainage, streaking, fever >101.0 F . Notify the office if you experience excessive bleeding that does not stop after 15-20 minutes of constant, firm   pressure.  Your blood pressure is too low today. Discontinue your lisinopril. Check your blood pressure at home tomorrow. If it continues to run below 100 come back in here for a recheck tomorrow. If you develop any abnormal symptoms such as lightheadedness, slurred speech, weakness, unsteadiness of gait, chest pains, or other symptoms of concern call 911 and go to the emergency room for further evaluation.

## 2013-09-05 NOTE — Progress Notes (Signed)
Procedure:  Consent obtained to close the wound without anesthesia due to allergies.  Wound was cleaned and wound flap was closed with 5-0 Ethilon #1 SI suture.  The area was still bleeding but the wound was not opening after the stictch was placed.  Drsg was placed and wound care was d/w pt.

## 2013-09-06 LAB — COMPREHENSIVE METABOLIC PANEL
ALT: 26 U/L (ref 0–53)
AST: 21 U/L (ref 0–37)
Albumin: 4.2 g/dL (ref 3.5–5.2)
Alkaline Phosphatase: 52 U/L (ref 39–117)
BILIRUBIN TOTAL: 0.4 mg/dL (ref 0.2–1.2)
BUN: 20 mg/dL (ref 6–23)
CO2: 22 mEq/L (ref 19–32)
Calcium: 9.3 mg/dL (ref 8.4–10.5)
Chloride: 99 mEq/L (ref 96–112)
Creat: 1.69 mg/dL — ABNORMAL HIGH (ref 0.50–1.35)
GLUCOSE: 147 mg/dL — AB (ref 70–99)
Potassium: 4.6 mEq/L (ref 3.5–5.3)
SODIUM: 135 meq/L (ref 135–145)
Total Protein: 7.1 g/dL (ref 6.0–8.3)

## 2013-09-10 ENCOUNTER — Other Ambulatory Visit: Payer: Self-pay | Admitting: Physician Assistant

## 2013-09-10 ENCOUNTER — Other Ambulatory Visit: Payer: Self-pay | Admitting: Family Medicine

## 2013-09-12 ENCOUNTER — Ambulatory Visit (INDEPENDENT_AMBULATORY_CARE_PROVIDER_SITE_OTHER): Payer: BC Managed Care – PPO | Admitting: Physician Assistant

## 2013-09-12 VITALS — BP 116/70 | HR 95 | Temp 97.4°F | Resp 18 | Ht 68.0 in | Wt 183.6 lb

## 2013-09-12 DIAGNOSIS — Z4802 Encounter for removal of sutures: Secondary | ICD-10-CM

## 2013-09-12 DIAGNOSIS — F988 Other specified behavioral and emotional disorders with onset usually occurring in childhood and adolescence: Secondary | ICD-10-CM

## 2013-09-12 DIAGNOSIS — I959 Hypotension, unspecified: Secondary | ICD-10-CM

## 2013-09-12 DIAGNOSIS — Z7189 Other specified counseling: Secondary | ICD-10-CM

## 2013-09-12 MED ORDER — LISDEXAMFETAMINE DIMESYLATE 70 MG PO CAPS: 70.0000 mg | ORAL_CAPSULE | ORAL | Status: DC

## 2013-09-12 NOTE — Progress Notes (Signed)
Subjective:    Patient ID: Jeff [name], male    DOB: 07-Oct-1950, 63 y.o.   MRN: [id number]  HPI Primary Physician: No PCP Per Patient  Chief Complaint: Follow up  HPI: 63 y.o. male with history below presents for follow up. Patient was seen on 09/05/13 with several issues and is here today to discuss follow of these.  1) Lightheadedness: Several weeks ago he felt lightheaded. Drank some water and OJ and felt fine afterwards. Blood pressure at his OV on 09/05/13 was found to be 84/56. His lisinopril was held and he was advised to monitor blood pressures at home. His at home blood pressures have typically ranged from the low 100's to the 1-teens over 60's to 70's. He did have a day, 09/11/13, with readings of 87/43. He did feel lightheaded this day. He worked outside all day without stopping to drink any water or eat anything. After drinking some OJ he did feel better. That has been his only episode of lightheadedness since his OV. No CP, chest tightness, palpitations, SOB, nausea, vomiting, or diaphoresis.   2) Suture removal: Had one suture placed on 09/05/13 after cutting his right 4th digit on a Corning dish. No issues. No pain, swelling, erythema, or drainage. Afebrile. No chills.   3) Medication refill: Requests a refill of his Vyvanse. Dr. Army Fossa writes this for him. He plans to come back in for follow up in 3 weeks to have repeat labs and will follow up with Dr. Laney Pastor at that time to reestablish with him. Tolerates well.     Past Medical History  Diagnosis Date  . Diabetes mellitus   . Hypertension      Home Meds: Prior to Admission medications   Medication Sig Start Date End Date Taking? Authorizing Provider  Blood Glucose Monitoring Suppl (ACCU-CHEK COMPACT CARE KIT) KIT Check glucose twice daily 12/17/12  Yes Leandrew Koyanagi, MD  glipiZIDE (GLUCOTROL XL) 5 MG 24 hr tablet Take 1 tablet (5 mg total) by mouth daily. 03/30/13  Yes Mancel Bale, PA-C  glucose blood  (ACCU-CHEK COMPACT PLUS) test strip Use as instructed 12/17/12  Yes Leandrew Koyanagi, MD  JANUVIA 100 MG tablet TAKE 1 TABLET BY MOUTH DAILY 09/10/13  Yes Heather M Marte, PA-C  lisdexamfetamine (VYVANSE) 70 MG capsule Take 1 capsule (70 mg total) by mouth every morning. 05/17/13  Yes Leandrew Koyanagi, MD  metFORMIN (GLUCOPHAGE) 1000 MG tablet TAKE 1 TABLET BY MOUTH TWICE DAILY WITH MEALS 06/09/13  Yes Eleanore Kurtis Bushman, PA-C  simvastatin (ZOCOR) 20 MG tablet Take 1 tablet (20 mg total) by mouth daily. PATIENT NEEDS F/UP CHOLESTEROL LABS FOR ADDITIONAL REFILLS 09/10/13  Yes Posey Boyer, MD  lisinopril (PRINIVIL,ZESTRIL) 10 MG tablet Take 1 tablet (10 mg total) by mouth daily. 03/09/13  No Mancel Bale, PA-C    Allergies:  Allergies  Allergen Reactions  . Flexeril [Cyclobenzaprine Hcl]   . Lidocaine Swelling    throat  . Novocain [Procaine Hcl] Swelling    History   Social History  . Marital Status: Single    Spouse Name: N/A    Number of Children: N/A  . Years of Education: N/A   Occupational History  . Press photographer    Social History Main Topics  . Smoking status: Never Smoker   . Smokeless tobacco: Not on file  . Alcohol Use: No  . Drug Use: No  . Sexual Activity: Not on file   Other Topics  Concern  . Not on file   Social History Narrative  . No narrative on file     Review of Systems     Objective:   Physical Exam  Physical Exam: Blood pressure 116/70, pulse 95, temperature 97.4 F (36.3 C), temperature source Oral, resp. rate 18, height _0  (1.727 m), weight 183 lb 9.6 oz (83.28 kg), SpO2 91.00%., Body mass index is 27.92 kg/(m^2). General: Well developed, well nourished, in no acute distress. Head: Normocephalic, atraumatic, eyes without discharge, sclera non-icteric, nares are without discharge. Bilateral auditory canals clear, TM's are without perforation, pearly grey and translucent with reflective cone of light bilaterally. Oral cavity  moist, posterior pharynx without exudate, erythema, peritonsillar abscess, or post nasal drip. Uvula midline.   Neck: Supple. No thyromegaly. Full ROM. No lymphadenopathy. Lungs: Clear bilaterally to auscultation without wheezes, rales, or rhonchi. Breathing is unlabored. Heart: RRR with S1 S2. No murmurs, rubs, or gallops appreciated. Msk:  Strength and tone normal for age. Extremities/Skin: Warm and dry. No clubbing or cyanosis. No edema. No rashes or suspicious lesions. One simple interrupted suture removed without difficulty along the right 4th digit. No erythema, STS, TTP, or drainage.  Neuro: Alert and oriented X 3. Moves all extremities spontaneously. Gait is normal. CNII-XII grossly in tact. Psych:  Responds to questions appropriately with a normal affect.        Assessment & Plan:  63 year old here for suture removal with hypotension and ADD  1) Suture removal -Suture removed per above  2) Hypotension -Improved -Continue to hold lisinopril -Continue to monitor at home blood pressures -Increase water intake  -Follow up 3 weeks   3) ADD -Vyvanse 70 mg daily #30 no RF -Follow up with Dr. Laney Pastor for further refills   Christell Faith, MHS, PA-C [facility] [place], [place] [address] [facility] 09/12/2013 5:45 PM

## 2013-09-13 ENCOUNTER — Other Ambulatory Visit: Payer: Self-pay | Admitting: Physician Assistant

## 2013-09-15 ENCOUNTER — Telehealth: Payer: Self-pay

## 2013-09-15 NOTE — Telephone Encounter (Signed)
Yes restart at 5mg  daily

## 2013-09-15 NOTE — Telephone Encounter (Signed)
Spoke to Darl PikesSusan- advised ok to start medication and RTC in 3 weeks.

## 2013-09-15 NOTE — Telephone Encounter (Signed)
Patient called about labs, reviewed labs with patient.  Patient is having trouble with BP after being taken off Lisinopril 10mg .  BP is running 156/102 this am.  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  States he will return to clinic in 3 weeks for recheck on BS.  He would like to know if he should go back on Lisinopril 5mg  until he can return.  He states the only side effect he is having is feeling like he needs to run.  Please advise.

## 2013-09-23 ENCOUNTER — Other Ambulatory Visit: Payer: Self-pay | Admitting: Internal Medicine

## 2013-09-23 NOTE — Telephone Encounter (Signed)
Called pt to check what BP has been running since he was instr'd to take 5 mg lisinopril QD on 09/15/13. He stated BP has been in 118-136/70s-80s range. Pt stated he thinks he has enough to last him until he returns for re-check. He will call me if he needs a few to hold him over.

## 2013-10-09 ENCOUNTER — Other Ambulatory Visit: Payer: Self-pay | Admitting: Family Medicine

## 2013-10-17 ENCOUNTER — Telehealth: Payer: Self-pay

## 2013-10-17 DIAGNOSIS — F988 Other specified behavioral and emotional disorders with onset usually occurring in childhood and adolescence: Secondary | ICD-10-CM

## 2013-10-17 DIAGNOSIS — Z7189 Other specified counseling: Secondary | ICD-10-CM

## 2013-10-17 NOTE — Telephone Encounter (Signed)
Patient called requesting a refill on Vyvanse 70 mg. Patient he will be out of town tomorrow. Patient request only a 30 day supply. Stated he has a scheduled appointment with Dr. Merla Richesoolittle. Please call patient today at [phone]. Anytime after today call patient on mobile [phone]

## 2013-10-18 MED ORDER — LISDEXAMFETAMINE DIMESYLATE 70 MG PO CAPS: 70.0000 mg | ORAL_CAPSULE | ORAL | Status: DC

## 2013-10-18 NOTE — Telephone Encounter (Signed)
LMOM that rx is up front ready for p/u 

## 2013-11-22 ENCOUNTER — Other Ambulatory Visit: Payer: Self-pay | Admitting: Family Medicine

## 2013-11-22 ENCOUNTER — Ambulatory Visit (INDEPENDENT_AMBULATORY_CARE_PROVIDER_SITE_OTHER): Payer: BC Managed Care – PPO | Admitting: Family Medicine

## 2013-11-22 VITALS — BP 126/78 | HR 86 | Temp 98.3°F | Resp 16 | Ht 68.0 in | Wt 184.0 lb

## 2013-11-22 DIAGNOSIS — I10 Essential (primary) hypertension: Secondary | ICD-10-CM

## 2013-11-22 DIAGNOSIS — E7800 Pure hypercholesterolemia, unspecified: Secondary | ICD-10-CM

## 2013-11-22 DIAGNOSIS — E1165 Type 2 diabetes mellitus with hyperglycemia: Secondary | ICD-10-CM

## 2013-11-22 DIAGNOSIS — Z7189 Other specified counseling: Secondary | ICD-10-CM

## 2013-11-22 DIAGNOSIS — IMO0001 Reserved for inherently not codable concepts without codable children: Secondary | ICD-10-CM

## 2013-11-22 DIAGNOSIS — F988 Other specified behavioral and emotional disorders with onset usually occurring in childhood and adolescence: Secondary | ICD-10-CM

## 2013-11-22 DIAGNOSIS — IMO0002 Reserved for concepts with insufficient information to code with codable children: Secondary | ICD-10-CM

## 2013-11-22 DIAGNOSIS — D649 Anemia, unspecified: Secondary | ICD-10-CM

## 2013-11-22 LAB — POCT CBC
Granulocyte percent: 71.6 %G (ref 37–80)
HEMATOCRIT: 39.5 % — AB (ref 43.5–53.7)
Hemoglobin: 12.7 g/dL — AB (ref 14.1–18.1)
LYMPH, POC: 1.6 (ref 0.6–3.4)
MCH, POC: 28.2 pg (ref 27–31.2)
MCHC: 32.2 g/dL (ref 31.8–35.4)
MCV: 87.5 fL (ref 80–97)
MID (cbc): 0.6 (ref 0–0.9)
MPV: 8.8 fL (ref 0–99.8)
POC Granulocyte: 5.5 (ref 2–6.9)
POC LYMPH %: 20.9 % (ref 10–50)
POC MID %: 7.5 %M (ref 0–12)
Platelet Count, POC: 254 10*3/uL (ref 142–424)
RBC: 4.51 M/uL — AB (ref 4.69–6.13)
RDW, POC: 12.7 %
WBC: 7.7 10*3/uL (ref 4.6–10.2)

## 2013-11-22 LAB — POCT GLYCOSYLATED HEMOGLOBIN (HGB A1C): HEMOGLOBIN A1C: 9.3

## 2013-11-22 LAB — POCT URINALYSIS DIPSTICK
BILIRUBIN UA: NEGATIVE
Glucose, UA: 500
Ketones, UA: NEGATIVE
Leukocytes, UA: NEGATIVE
NITRITE UA: NEGATIVE
Protein, UA: NEGATIVE
RBC UA: NEGATIVE
Urobilinogen, UA: 0.2
pH, UA: 5.5

## 2013-11-22 LAB — GLUCOSE, POCT (MANUAL RESULT ENTRY): POC Glucose: 200 mg/dl — AB (ref 70–99)

## 2013-11-22 MED ORDER — LISINOPRIL 10 MG PO TABS: 10.0000 mg | ORAL_TABLET | Freq: Every day | ORAL | Status: DC

## 2013-11-22 MED ORDER — SITAGLIPTIN PHOSPHATE 100 MG PO TABS: 100.0000 mg | ORAL_TABLET | Freq: Every day | ORAL | Status: DC

## 2013-11-22 MED ORDER — LISDEXAMFETAMINE DIMESYLATE 70 MG PO CAPS: 70.0000 mg | ORAL_CAPSULE | ORAL | Status: DC

## 2013-11-22 MED ORDER — METFORMIN HCL 1000 MG PO TABS: 1000.0000 mg | ORAL_TABLET | Freq: Two times a day (BID) | ORAL | Status: DC

## 2013-11-22 MED ORDER — GLIPIZIDE ER 10 MG PO TB24: 10.0000 mg | ORAL_TABLET | Freq: Every day | ORAL | Status: DC

## 2013-11-22 NOTE — Progress Notes (Signed)
Subjective:    Patient ID: Jeff [name], male    DOB: 10/21/1950, 63 y.o.   MRN: [id number]  11/22/2013  Medication Refill   HPI This 63 y.o. male presents for medication refills for the following:  1.  DMII:  Six month follow-up for diabetes; checking sugars sporadically but not routinely.  At 10:30am, gets really dry mouth, words start slurring; checking sugar and is over 300.  Travels with work.  Poor diet.  Eats a lot of fish, chicken. Red meat once every two weeks.  Taking Metformin 1031m bid, Januvia 1044mqhs, Glipizide 33m42m am.  Exercising/football official.  Jogging starts mid June.  Walking every day. Walking 12 miles every 2-3 days.   Always takes steps.  Eye exam by Dr. HecHerbert Deanerery August; last exam 01/2013.  Denies n/t in feet or hands.  2.  HTN:  Compliance with Lisinopril 62m68mily.  Six month follow-up; recent adjustments to Lisinopril included decreasing dose to 62mg333mly. Not checking BP at home.  Patient reports good compliance with medication, good tolerance to medication, and good symptom control.   Denies CP/palp/SOB/leg swelling.  3.  Hyperlipidemia:  Not taking Simvastatin currently; was advised to stop medication in the past six months but not sure why; stated that Simvastatin stopped when sugar was low at last visit.  Ate this afternoon at 1:15pm; had fried chicken, mashed potatoes, green beans, salad, beets.  4. ADHD:  Vyvanse working very well for patient.  Tolerating medication without side effects.  Needs refill.  Pt states that he travels Monday through Fridays and must follow-up with Dr. DooliLaney Pastoreekends only.  5. Health Maintenance: colonoscopy over ten years ago.  Last colonoscopy age 17; l63t complete physical 3 years ago.  No history of anemia.  Denies melena or bloody stools.     Review of Systems  Constitutional: Negative for fever, chills, diaphoresis, activity change, appetite change and fatigue.  Respiratory: Negative for cough and shortness  of breath.   Cardiovascular: Negative for chest pain, palpitations and leg swelling.  Gastrointestinal: Negative for nausea, vomiting, abdominal pain, diarrhea, constipation, blood in stool and rectal pain.  Endocrine: Negative for cold intolerance, heat intolerance, polydipsia, polyphagia and polyuria.  Skin: Negative for color change, pallor, rash and wound.  Neurological: Negative for dizziness, tremors, seizures, syncope, facial asymmetry, speech difficulty, weakness, light-headedness, numbness and headaches.  Psychiatric/Behavioral: Positive for decreased concentration. Negative for dysphoric mood. The patient is not nervous/anxious.     Past Medical History  Diagnosis Date  . Diabetes mellitus   . Hypertension   . Hyperlipidemia   . ADHD (attention deficit hyperactivity disorder)    Past Surgical History  Procedure Laterality Date  . Neck surgery    . Spine surgery      Cervical spine x 2; PhillHardin Negustzer.    Allergies  Allergen Reactions  . Flexeril [Cyclobenzaprine Hcl]   . Lidocaine Swelling    throat  . Novocain [Procaine Hcl] Swelling   Current Outpatient Prescriptions  Medication Sig Dispense Refill  . Blood Glucose Monitoring Suppl (ACCU-CHEK COMPACT CARE KIT) KIT Check glucose twice daily  1 each  1  . glipiZIDE (GLUCOTROL XL) 10 MG 24 hr tablet Take 1 tablet (10 mg total) by mouth daily.  90 tablet  1  . glucose blood (ACCU-CHEK COMPACT PLUS) test strip Use as instructed  100 each  12  . lisdexamfetamine (VYVANSE) 70 MG capsule Take 1 capsule (70 mg total) by mouth every morning.  30 capsule  0  . lisinopril (PRINIVIL,ZESTRIL) 10 MG tablet Take 1 tablet (10 mg total) by mouth daily.  90 tablet  1  . metFORMIN (GLUCOPHAGE) 1000 MG tablet TAKE 1 TABLET BY MOUTH TWICE DAILY  180 tablet  0  . metFORMIN (GLUCOPHAGE) 1000 MG tablet Take 1 tablet (1,000 mg total) by mouth 2 (two) times daily with a meal.  180 tablet  1  . simvastatin (ZOCOR) 20 MG tablet TAKE 1 TABLET  BY MOUTH DAILY. NEEDS FOLLOW UP CHOLESTEROL LABS FOR ADDITIONAL REFILLS  30 tablet  0  . sitaGLIPtin (JANUVIA) 100 MG tablet Take 1 tablet (100 mg total) by mouth daily.  90 tablet  1   No current facility-administered medications for this visit.   History   Social History  . Marital Status: Single    Spouse Name: N/A    Number of Children: N/A  . Years of Education: N/A   Occupational History  . Press photographer    Social History Main Topics  . Smoking status: Never Smoker   . Smokeless tobacco: Not on file  . Alcohol Use: No  . Drug Use: No  . Sexual Activity: Not on file   Other Topics Concern  . Not on file   Social History Narrative  . No narrative on file   Family History  Problem Relation Age of Onset  . Heart disease Mother     CHF  . Diabetes Mother   . Heart disease Father   . Cancer Brother        Objective:    BP 126/78  Pulse 86  Temp(Src) 98.3 F (36.8 C)  Resp 16  Ht '5\' 8"'  (1.727 m)  Wt 184 lb (83.462 kg)  BMI 27.98 kg/m2  SpO2 98% Physical Exam  Nursing note and vitals reviewed. Constitutional: He is oriented to person, place, and time. He appears well-developed and well-nourished. No distress.  HENT:  Head: Normocephalic and atraumatic.  Right Ear: External ear normal.  Left Ear: External ear normal.  Nose: Nose normal.  Mouth/Throat: Oropharynx is clear and moist.  Eyes: Conjunctivae and EOM are normal. Pupils are equal, round, and reactive to light.  Neck: Normal range of motion. Neck supple. Carotid bruit is not present. No thyromegaly present.  Cardiovascular: Normal rate, regular rhythm, normal heart sounds and intact distal pulses.  Exam reveals no gallop and no friction rub.   No murmur heard. Pulmonary/Chest: Effort normal and breath sounds normal. He has no wheezes. He has no rales.  Abdominal: Soft. Bowel sounds are normal. He exhibits no distension and no mass. There is no tenderness. There is no rebound and no  guarding.  Lymphadenopathy:    He has no cervical adenopathy.  Neurological: He is alert and oriented to person, place, and time. No cranial nerve deficit.  Skin: Skin is warm and dry. No rash noted. He is not diaphoretic.  No callus formation of L foot.  Psychiatric: He has a normal mood and affect. His behavior is normal.   Results for orders placed in visit on 11/22/13  POCT CBC      Result Value Ref Range   WBC 7.7  4.6 - 10.2 K/uL   Lymph, poc 1.6  0.6 - 3.4   POC LYMPH PERCENT 20.9  10 - 50 %L   MID (cbc) 0.6  0 - 0.9   POC MID % 7.5  0 - 12 %M   POC Granulocyte 5.5  2 - 6.9  Granulocyte percent 71.6  37 - 80 %G   RBC 4.51 (*) 4.69 - 6.13 M/uL   Hemoglobin 12.7 (*) 14.1 - 18.1 g/dL   HCT, POC 39.5 (*) 43.5 - 53.7 %   MCV 87.5  80 - 97 fL   MCH, POC 28.2  27 - 31.2 pg   MCHC 32.2  31.8 - 35.4 g/dL   RDW, POC 12.7     Platelet Count, POC 254  142 - 424 K/uL   MPV 8.8  0 - 99.8 fL  GLUCOSE, POCT (MANUAL RESULT ENTRY)      Result Value Ref Range   POC Glucose 200 (*) 70 - 99 mg/dl  POCT GLYCOSYLATED HEMOGLOBIN (HGB A1C)      Result Value Ref Range   Hemoglobin A1C 9.3    POCT URINALYSIS DIPSTICK      Result Value Ref Range   Color, UA yellow     Clarity, UA clear     Glucose, UA 500     Bilirubin, UA neg     Ketones, UA neg     Spec Grav, UA <=1.005     Blood, UA neg     pH, UA 5.5     Protein, UA neg     Urobilinogen, UA 0.2     Nitrite, UA neg     Leukocytes, UA Negative         Assessment & Plan:  Type II or unspecified type diabetes mellitus without mention of complication, uncontrolled - Plan: POCT CBC, POCT glucose (manual entry), POCT glycosylated hemoglobin (Hb A1C), POCT urinalysis dipstick, COMPLETE METABOLIC PANEL WITH GFR, TSH, Microalbumin, urine  Pure hypercholesterolemia - Plan: POCT CBC, POCT glucose (manual entry), POCT glycosylated hemoglobin (Hb A1C), POCT urinalysis dipstick, COMPLETE METABOLIC PANEL WITH GFR  Essential hypertension,  benign - Plan: POCT CBC, POCT glucose (manual entry), POCT glycosylated hemoglobin (Hb A1C), POCT urinalysis dipstick, COMPLETE METABOLIC PANEL WITH GFR, TSH  Anemia, unspecified anemia type  Diabetes type 2, uncontrolled - Plan: glipiZIDE (GLUCOTROL XL) 10 MG 24 hr tablet  Essential hypertension - Plan: lisinopril (PRINIVIL,ZESTRIL) 10 MG tablet  ADD (attention deficit disorder) - Plan: lisdexamfetamine (VYVANSE) 70 MG capsule  Encounter for medication review and counseling - Plan: lisdexamfetamine (VYVANSE) 70 MG capsule  1.  DMII: uncontrolled; increase Glipizide ER to 82m daily.  Refills of Metformin and Januvia provided.  Eye exam UTD.   2.  HTN: controlled; refill of Lisinopril 141mdaily provided.  Obtain u/a. 3.  Hyperlipidemia: uncontrolled due to non-compliance with statin; will warrant lipid panel at next visit. 4.  ADD: controlled; refill x 1 of Vyvanse provided; advised of new controlled substance policy and patient plans to follow-up with Dr. DoLaney Pastorn July.  Printed copy of controlled substance policy provided with AVS. 5.  Anemia: New onset; not sure of date of last colonoscopy; if overdue, will send for repeat colonoscopy.  Will have repeat CBC at follow-up visit in one month.  Meds ordered this encounter  Medications  . glipiZIDE (GLUCOTROL XL) 10 MG 24 hr tablet    Sig: Take 1 tablet (10 mg total) by mouth daily.    Dispense:  90 tablet    Refill:  1    Order Specific Question:  Supervising Provider    Answer:  DOOLITTLE, ROBERT P [[id number]]. sitaGLIPtin (JANUVIA) 100 MG tablet    Sig: Take 1 tablet (100 mg total) by mouth daily.    Dispense:  90 tablet    Refill:  1  . lisinopril (PRINIVIL,ZESTRIL) 10 MG tablet    Sig: Take 1 tablet (10 mg total) by mouth daily.    Dispense:  90 tablet    Refill:  1    Order Specific Question:  Supervising Provider    Answer:  DOOLITTLE, ROBERT P [[id number]]  . metFORMIN (GLUCOPHAGE) 1000 MG tablet    Sig: Take 1 tablet (1,000 mg  total) by mouth 2 (two) times daily with a meal.    Dispense:  180 tablet    Refill:  1  . lisdexamfetamine (VYVANSE) 70 MG capsule    Sig: Take 1 capsule (70 mg total) by mouth every morning.    Dispense:  30 capsule    Refill:  0    Order Specific Question:  Supervising Provider    Answer:  Wardell Honour [[id number]]    No Follow-up on file.    Reginia Forts, M.D.  [facility] [address] [place], [place]  [address] [phone] phone [phone] fax

## 2013-11-22 NOTE — Patient Instructions (Signed)
[facility] Policy for Prescribing Controlled Substances (Revised 04/2012) 1. Prescriptions for controlled substances will be filled by ONE provider at [facility] with whom you have established and developed a plan for your care, including follow-up. 2. You are encouraged to schedule an appointment with your prescriber at our appointment center for follow-up visits whenever possible. 3. If you request a prescription for the controlled substance while at [facility] for an acute problem (with someone other than your regular prescriber), you MAY be given a ONE-TIME prescription for a 30-day supply of the controlled substance, to allow time for you to return to see your regular prescriber for additional prescriptions. 

## 2013-11-23 LAB — TSH: TSH: 2.392 u[IU]/mL (ref 0.350–4.500)

## 2013-11-23 LAB — COMPLETE METABOLIC PANEL WITHOUT GFR
ALT: 54 U/L — ABNORMAL HIGH (ref 0–53)
AST: 36 U/L (ref 0–37)
Albumin: 4.5 g/dL (ref 3.5–5.2)
Alkaline Phosphatase: 57 U/L (ref 39–117)
BUN: 12 mg/dL (ref 6–23)
CO2: 21 meq/L (ref 19–32)
Calcium: 9.8 mg/dL (ref 8.4–10.5)
Chloride: 101 meq/L (ref 96–112)
Creat: 1.31 mg/dL (ref 0.50–1.35)
GFR, Est African American: 66 mL/min
GFR, Est Non African American: 57 mL/min — ABNORMAL LOW
Glucose, Bld: 217 mg/dL — ABNORMAL HIGH (ref 70–99)
Potassium: 4.3 meq/L (ref 3.5–5.3)
Sodium: 136 meq/L (ref 135–145)
Total Bilirubin: 0.4 mg/dL (ref 0.2–1.2)
Total Protein: 7.3 g/dL (ref 6.0–8.3)

## 2013-11-23 LAB — MICROALBUMIN, URINE: Microalb, Ur: 0.5 mg/dL (ref 0.00–1.89)

## 2013-11-24 LAB — HEPATITIS PANEL, ACUTE
HCV Ab: NEGATIVE
HEP B C IGM: NONREACTIVE
Hep A IgM: NONREACTIVE
Hepatitis B Surface Ag: NEGATIVE

## 2014-01-03 ENCOUNTER — Other Ambulatory Visit: Payer: Self-pay | Admitting: Internal Medicine

## 2014-02-03 ENCOUNTER — Other Ambulatory Visit: Payer: Self-pay | Admitting: Family Medicine

## 2014-02-27 ENCOUNTER — Ambulatory Visit (INDEPENDENT_AMBULATORY_CARE_PROVIDER_SITE_OTHER): Payer: BC Managed Care – PPO | Admitting: Internal Medicine

## 2014-02-27 ENCOUNTER — Other Ambulatory Visit: Payer: Self-pay | Admitting: Internal Medicine

## 2014-02-27 VITALS — BP 122/76 | HR 94 | Temp 98.8°F | Resp 16 | Ht 68.0 in | Wt 183.6 lb

## 2014-02-27 DIAGNOSIS — M76899 Other specified enthesopathies of unspecified lower limb, excluding foot: Secondary | ICD-10-CM

## 2014-02-27 DIAGNOSIS — E119 Type 2 diabetes mellitus without complications: Secondary | ICD-10-CM

## 2014-02-27 DIAGNOSIS — R5383 Other fatigue: Secondary | ICD-10-CM

## 2014-02-27 DIAGNOSIS — L989 Disorder of the skin and subcutaneous tissue, unspecified: Secondary | ICD-10-CM

## 2014-02-27 DIAGNOSIS — F988 Other specified behavioral and emotional disorders with onset usually occurring in childhood and adolescence: Secondary | ICD-10-CM

## 2014-02-27 DIAGNOSIS — R5381 Other malaise: Secondary | ICD-10-CM

## 2014-02-27 DIAGNOSIS — Z7189 Other specified counseling: Secondary | ICD-10-CM

## 2014-02-27 DIAGNOSIS — R519 Headache, unspecified: Secondary | ICD-10-CM

## 2014-02-27 DIAGNOSIS — E785 Hyperlipidemia, unspecified: Secondary | ICD-10-CM

## 2014-02-27 DIAGNOSIS — M7062 Trochanteric bursitis, left hip: Secondary | ICD-10-CM

## 2014-02-27 DIAGNOSIS — R51 Headache: Secondary | ICD-10-CM

## 2014-02-27 LAB — POCT CBC
Granulocyte percent: 67.4 %G (ref 37–80)
HEMATOCRIT: 41.9 % — AB (ref 43.5–53.7)
Hemoglobin: 13.7 g/dL — AB (ref 14.1–18.1)
LYMPH, POC: 1.7 (ref 0.6–3.4)
MCH, POC: 28.8 pg (ref 27–31.2)
MCHC: 32.8 g/dL (ref 31.8–35.4)
MCV: 87.9 fL (ref 80–97)
MID (CBC): 0.5 (ref 0–0.9)
MPV: 7.8 fL (ref 0–99.8)
PLATELET COUNT, POC: 234 10*3/uL (ref 142–424)
POC GRANULOCYTE: 4.6 (ref 2–6.9)
POC LYMPH %: 24.6 % (ref 10–50)
POC MID %: 8 % (ref 0–12)
RBC: 4.77 M/uL (ref 4.69–6.13)
RDW, POC: 13.7 %
WBC: 6.8 10*3/uL (ref 4.6–10.2)

## 2014-02-27 LAB — COMPREHENSIVE METABOLIC PANEL
ALK PHOS: 55 U/L (ref 39–117)
ALT: 58 U/L — ABNORMAL HIGH (ref 0–53)
AST: 54 U/L — ABNORMAL HIGH (ref 0–37)
Albumin: 4 g/dL (ref 3.5–5.2)
BUN: 11 mg/dL (ref 6–23)
CO2: 22 mEq/L (ref 19–32)
Calcium: 9.4 mg/dL (ref 8.4–10.5)
Chloride: 104 mEq/L (ref 96–112)
Creat: 1.13 mg/dL (ref 0.50–1.35)
GLUCOSE: 172 mg/dL — AB (ref 70–99)
Potassium: 4.6 mEq/L (ref 3.5–5.3)
Sodium: 137 mEq/L (ref 135–145)
Total Bilirubin: 0.4 mg/dL (ref 0.2–1.2)
Total Protein: 6.7 g/dL (ref 6.0–8.3)

## 2014-02-27 LAB — LIPID PANEL
Cholesterol: 208 mg/dL — ABNORMAL HIGH (ref 0–200)
HDL: 40 mg/dL (ref >39)
LDL CALC: 101 mg/dL — AB (ref 0–99)
Total CHOL/HDL Ratio: 5.2 Ratio
Triglycerides: 333 mg/dL — ABNORMAL HIGH (ref <150)
VLDL: 67 mg/dL — AB (ref 0–40)

## 2014-02-27 LAB — POCT GLYCOSYLATED HEMOGLOBIN (HGB A1C): Hemoglobin A1C: 9.3

## 2014-02-27 LAB — TSH: TSH: 1.915 u[IU]/mL (ref 0.350–4.500)

## 2014-02-27 MED ORDER — LISDEXAMFETAMINE DIMESYLATE 70 MG PO CAPS: 70.0000 mg | ORAL_CAPSULE | Freq: Every day | ORAL | Status: DC

## 2014-02-27 MED ORDER — GLUCOSE BLOOD VI STRP: ORAL_STRIP | Status: DC

## 2014-02-27 MED ORDER — LISDEXAMFETAMINE DIMESYLATE 70 MG PO CAPS: 70.0000 mg | ORAL_CAPSULE | ORAL | Status: DC

## 2014-02-27 MED ORDER — MELOXICAM 15 MG PO TABS: 15.0000 mg | ORAL_TABLET | Freq: Every day | ORAL | Status: DC

## 2014-02-27 NOTE — Progress Notes (Signed)
Subjective:  This chart was scribed for Jeff Lin, MD by Molli Posey, Medical scribe. This patient was seen in room 2 and the patient's care was started 11:36 AM.  Patient ID: Jeff [name], male    DOB: 02/11/1951, 63 y.o.   MRN: [id number]  HPI HPI Comments: Jeff [name] is a 63 y.o. male with a history of DM who presents to [facility] for a refill of his Vyvanse.  He states that he has been experiencing intermittent headaches localized to his temples bilaterally but is not sure if his symptoms started after he ran out of medication.  He states that his headaches will begin after lunch but he does not wake up with a headache.  He states that he gave up drinking Cokes last November but has recently starting drinking a Coca-Cola daily to alleviate his headaches(takes 19mn).   He lists fatigue as new symptom.  Worn out after work. He denies nausea, changes in balance, weight loss, sleep disturbance, dizziness, neck stiffness, sinus pressure, allergies, and visual changes as associated symptoms.   He does not have a history of neck problems.   He has not had a real vacation this year. 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  He is also complaining of a sore on his scalp that never heals [id number] He is also complaining of a sharp, intermittent pain radiating throughout his left leg that started 3 weeks ago.  He states that getting up and sitting down will aggravate his pain.  He denies back pain as an associated symptom.  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     He states that his eyes need to be rechecked but is not able to schedule an appointment until December.  Patient Active Problem List   Diagnosis Date Noted  . Hearing loss in right ear 10/14/2011  . Agent orange exposure 10/14/2011  . DM (diabetes mellitus)------see last OV A1c 9.3 and glip doubled 10/14/2011  . ADD (attention deficit disorder)-----off vyvanse 4 weeks and not  doing well---focus bad---work completion slowed 10/14/2011  . Hyperlipidemia 10/14/2011  . BMI 28.0-28.9,adult 10/14/2011   Past Medical History  Diagnosis Date  . Diabetes mellitus   . Hypertension   . Hyperlipidemia   . ADHD (attention deficit hyperactivity disorder)    Past Surgical History  Procedure Laterality Date  . Neck surgery    . Spine surgery      Cervical spine x 2; PhHardin NegusCritzer.   Prior to Admission medications   Medication Sig Start Date End Date Taking? Authorizing Provider  Blood Glucose Monitoring Suppl (ACCU-CHEK COMPACT CARE KIT) KIT Check glucose twice daily 12/17/12  Yes RoLeandrew KoyanagiMD  glipiZIDE (GLUCOTROL XL) 10 MG 24 hr tablet Take 1 tablet (10 mg total) by mouth daily. 11/22/13  Yes KrWardell HonourMD  glucose blood (ACCU-CHEK COMPACT PLUS) test strip Use as instructed 12/17/12  Yes RoLeandrew KoyanagiMD  lisdexamfetamine (VYVANSE) 70 MG capsule Take 1 capsule (70 mg total) by mouth every morning. 11/22/13  Yes KrWardell HonourMD  lisinopril (PRINIVIL,ZESTRIL) 10 MG tablet Take 1 tablet (10 mg total) by mouth daily. 11/22/13  Yes KrWardell HonourMD  metFORMIN (GLUCOPHAGE) 1000 MG tablet TAKE 1 TABLET BY MOUTH TWICE DAILY 09/13/13  Yes Ryan M Dunn, PA-C  metFORMIN (GLUCOPHAGE) 1000 MG tablet Take 1 tablet (1,000 mg total) by mouth 2 (two) times daily with  a meal. 11/22/13  Yes Wardell Honour, MD  simvastatin (ZOCOR) 20 MG tablet Take 1 tablet (20 mg total) by mouth daily. NO MORE REFILLS WITHOUT OFFICE VISIT - 2ND NOTICE 02/03/14  Yes Wardell Honour, MD  sitaGLIPtin (JANUVIA) 100 MG tablet Take 1 tablet (100 mg total) by mouth daily. 11/22/13  Yes Wardell Honour, MD   Family History  Problem Relation Age of Onset  . Heart disease Mother     CHF  . Diabetes Mother   . Heart disease Father   . Cancer Brother    History   Social History  . Marital Status: Single    Spouse Name: N/A    Number of Children: N/A  . Years of Education: N/A    Occupational History  . Press photographer    Social History Main Topics  . Smoking status: Never Smoker   . Smokeless tobacco: Not on file  . Alcohol Use: No  . Drug Use: No  . Sexual Activity: Not on file   Other Topics Concern  . Not on file   Social History Narrative  . No narrative on file   Allergies  Allergen Reactions  . Flexeril [Cyclobenzaprine Hcl]   . Lidocaine Swelling    throat  . Novocain [Procaine Hcl] Swelling    Review of Systems  Constitutional: Positive for fatigue. Negative for unexpected weight change.  HENT: Negative for sinus pressure.   Eyes: Negative for visual disturbance.  Gastrointestinal: Negative for nausea.  Musculoskeletal: Positive for arthralgias. Negative for back pain and neck stiffness.  Skin: Positive for wound.  Allergic/Immunologic: Negative for environmental allergies.  Neurological: Positive for headaches. Negative for dizziness.  Psychiatric/Behavioral: Negative for sleep disturbance.     Objective:  Physical Exam  Nursing note and vitals reviewed. Constitutional: He is oriented to person, place, and time. He appears well-developed and well-nourished. No distress.  HENT:  Head: Normocephalic and atraumatic.  Nose: Nose normal.  Mouth/Throat: Oropharynx is clear and moist.  Eyes: Conjunctivae and EOM are normal. Pupils are equal, round, and reactive to light.  Neck: Normal range of motion. Neck supple. No thyromegaly present.  Cardiovascular: Normal rate, regular rhythm and normal heart sounds.   No murmur heard. Pulmonary/Chest: Effort normal and breath sounds normal. No respiratory distress. He has no wheezes. He has no rales.  Musculoskeletal: He exhibits no edema.  Lymphadenopathy:    He has no cervical adenopathy.  Neurological: He is alert and oriented to person, place, and time. He has normal reflexes. No cranial nerve deficit. He exhibits normal muscle tone. Coordination normal.  Skin:  Small  lesion crown w/ scabbing /no pigment change but skin thickened around scab  Psychiatric: He has a normal mood and affect. His behavior is normal.     BP 122/76  Pulse 94  Temp(Src) 98.8 F (37.1 C) (Oral)  Resp 16  Ht '5\' 8"'  (1.727 m)  Wt 183 lb 9.6 oz (83.28 kg)  BMI 27.92 kg/m2  SpO2 98% Assessment & Plan:  I personally performed the services described in this documentation, which was scribed in my presence. The recorded information has been reviewed and is accurate.  Type 2 diabetes mellitus without complication - Plan: POCT glycosylated hemoglobin (Hb A1C)=9.3 still  He's against needles  Consider invokana after rest of labs  Other malaise and fatigue - Plan: POCT CBC, Comprehensive metabolic panel, TSH  Nonintractable episodic headache, unspecified headache type---episodic w/ normal neuro--? Medchanges/vision trouble/extra  stress--will start with opth exam  w/ Millers  Scalp lesion - Plan: Ambulatory referral to Dermatology to r/o early basal cell  Other and unspecified hyperlipidemia - Plan: Lipid panel to see if meds adequate  Trochanteric bursitis of left hip---stretch/ice p games/melox 1 mo  ADD (attention deficit disorder) - Plan: lisdexamfetamine (VYVANSE) 70 MG capsule restarted  Meds ordered this encounter  Medications  . meloxicam (MOBIC) 15 MG tablet    Sig: Take 1 tablet (15 mg total) by mouth daily.    Dispense:  30 tablet    Refill:  0  . lisdexamfetamine (VYVANSE) 70 MG capsule    Sig: Take 1 capsule (70 mg total) by mouth every morning.    Dispense:  30 capsule    Refill:  0  . lisdexamfetamine (VYVANSE) 70 MG capsule    Sig: Take 1 capsule (70 mg total) by mouth daily. For 60d after signed    Dispense:  30 capsule    Refill:  0  . lisdexamfetamine (VYVANSE) 70 MG capsule    Sig: Take 1 capsule (70 mg total) by mouth daily. For 30d after signed    Dispense:  30 capsule    Refill:  0  . glucose blood (ACCU-CHEK COMPACT PLUS) test strip    Sig: Use  as instructed    Dispense:  100 each    Refill:  12   Cont aft labs appt f/ 70mo

## 2014-03-01 ENCOUNTER — Encounter: Payer: Self-pay | Admitting: Internal Medicine

## 2014-03-01 ENCOUNTER — Telehealth: Payer: Self-pay

## 2014-03-01 MED ORDER — SIMVASTATIN 20 MG PO TABS: 20.0000 mg | ORAL_TABLET | Freq: Every day | ORAL | Status: DC

## 2014-03-01 NOTE — Telephone Encounter (Signed)
Reviewed Dr Doolittle's letter re: labs to pt and there was no change in Rx, so sent in RFs for pt. Notified pt.

## 2014-03-01 NOTE — Telephone Encounter (Signed)
Patient says Dr Merla Riches refilled his diabetes medications, however, he did not send in simvastatin. Please advise

## 2014-03-08 NOTE — Progress Notes (Signed)
LMVM for pt to CB to schedule appt with Dr. Merla Richesoolittle for December.

## 2014-03-16 NOTE — Progress Notes (Signed)
LMOM for pt to call to sche an appt with Dr. Merla Richesoolittle in 3 months

## 2014-03-28 ENCOUNTER — Other Ambulatory Visit: Payer: Self-pay | Admitting: Internal Medicine

## 2014-03-29 ENCOUNTER — Telehealth: Payer: Self-pay | Admitting: Family Medicine

## 2014-03-29 NOTE — Telephone Encounter (Signed)
Message copied by Alison MurrayHOLLETT, Alexy Bringle L on Mon Mar 29, 2014  3:41 PM ------      Message from: Tonye PearsonDOOLITTLE, ROBERT P      Created: Sat Feb 27, 2014  4:17 PM       F/u appt w/ me at 104 3 months ------

## 2014-03-29 NOTE — Telephone Encounter (Signed)
[place] [employer] He will see Dr. Merla Richesoolittle at the walk-in. Pt had a referral to Dr. Terri PiedraLupton but was unable to make it to his set appointment. He will call to reschedule. I advised him to let us know if he had any issues.

## 2014-03-29 NOTE — Telephone Encounter (Signed)
Dr Merla Richesoolittle, your OV notes say Meloxicam x 1 mos. Did you want to give any RFs?

## 2014-05-17 ENCOUNTER — Telehealth: Payer: Self-pay

## 2014-05-17 NOTE — Telephone Encounter (Signed)
LMVM for patient to come in to have his flu shot.

## 2014-05-26 ENCOUNTER — Other Ambulatory Visit: Payer: Self-pay | Admitting: Family Medicine

## 2014-05-26 ENCOUNTER — Telehealth: Payer: Self-pay

## 2014-05-26 NOTE — Telephone Encounter (Signed)
PA was approved for Vyvanse through 05/25/15. Notified pharm.

## 2014-05-27 ENCOUNTER — Other Ambulatory Visit: Payer: Self-pay | Admitting: Family Medicine

## 2014-05-27 ENCOUNTER — Other Ambulatory Visit: Payer: Self-pay

## 2014-05-27 MED ORDER — LISINOPRIL 10 MG PO TABS: 10.0000 mg | ORAL_TABLET | Freq: Every day | ORAL | Status: DC

## 2014-05-27 NOTE — Telephone Encounter (Signed)
Dr Merla Richesoolittle, you saw pt in Sept for 3 mos check up, but don't see HTN discussed. He is due for f/up and I gave him 1 mos lisinopril RF per protocol, but pharm is requesting a 90 day instead. Do you want to OK the 90 day?

## 2014-06-07 ENCOUNTER — Telehealth: Payer: Self-pay

## 2014-06-07 DIAGNOSIS — F988 Other specified behavioral and emotional disorders with onset usually occurring in childhood and adolescence: Secondary | ICD-10-CM

## 2014-06-07 DIAGNOSIS — Z7189 Other specified counseling: Secondary | ICD-10-CM

## 2014-06-07 NOTE — Telephone Encounter (Signed)
DOOLITTLE - Patient needs a refill on his vyvanse and his ADHD medicine.  [phone]

## 2014-06-09 MED ORDER — LISDEXAMFETAMINE DIMESYLATE 70 MG PO CAPS: 70.0000 mg | ORAL_CAPSULE | ORAL | Status: DC

## 2014-06-09 MED ORDER — LISDEXAMFETAMINE DIMESYLATE 70 MG PO CAPS: 70.0000 mg | ORAL_CAPSULE | Freq: Every day | ORAL | Status: DC

## 2014-06-09 NOTE — Telephone Encounter (Signed)
Meds ordered this encounter  Medications  . lisdexamfetamine (VYVANSE) 70 MG capsule    Sig: Take 1 capsule (70 mg total) by mouth every morning.    Dispense:  30 capsule    Refill:  0  . lisdexamfetamine (VYVANSE) 70 MG capsule    Sig: Take 1 capsule (70 mg total) by mouth daily. For 60d after signed    Dispense:  30 capsule    Refill:  0  . lisdexamfetamine (VYVANSE) 70 MG capsule    Sig: Take 1 capsule (70 mg total) by mouth daily. For 30d after signed    Dispense:  30 capsule    Refill:  0

## 2014-06-09 NOTE — Telephone Encounter (Signed)
Notified pt. 

## 2014-06-24 ENCOUNTER — Other Ambulatory Visit: Payer: Self-pay | Admitting: Internal Medicine

## 2014-06-29 ENCOUNTER — Other Ambulatory Visit: Payer: Self-pay | Admitting: Internal Medicine

## 2014-06-30 ENCOUNTER — Other Ambulatory Visit: Payer: Self-pay | Admitting: Physician Assistant

## 2014-08-05 ENCOUNTER — Other Ambulatory Visit: Payer: Self-pay | Admitting: Physician Assistant

## 2014-08-23 ENCOUNTER — Other Ambulatory Visit: Payer: Self-pay | Admitting: Physician Assistant

## 2014-09-17 ENCOUNTER — Other Ambulatory Visit: Payer: Self-pay | Admitting: Internal Medicine

## 2014-10-02 ENCOUNTER — Ambulatory Visit (INDEPENDENT_AMBULATORY_CARE_PROVIDER_SITE_OTHER): Payer: BC Managed Care – PPO | Admitting: Family Medicine

## 2014-10-02 VITALS — BP 100/70 | HR 86 | Temp 97.5°F | Ht 68.25 in | Wt 176.1 lb

## 2014-10-02 DIAGNOSIS — N183 Chronic kidney disease, stage 3 unspecified: Secondary | ICD-10-CM

## 2014-10-02 DIAGNOSIS — Z7189 Other specified counseling: Secondary | ICD-10-CM | POA: Diagnosis not present

## 2014-10-02 DIAGNOSIS — Z79899 Other long term (current) drug therapy: Secondary | ICD-10-CM | POA: Diagnosis not present

## 2014-10-02 DIAGNOSIS — E119 Type 2 diabetes mellitus without complications: Secondary | ICD-10-CM | POA: Diagnosis not present

## 2014-10-02 DIAGNOSIS — F909 Attention-deficit hyperactivity disorder, unspecified type: Secondary | ICD-10-CM | POA: Diagnosis not present

## 2014-10-02 DIAGNOSIS — H5360 Unspecified night blindness: Secondary | ICD-10-CM

## 2014-10-02 DIAGNOSIS — F988 Other specified behavioral and emotional disorders with onset usually occurring in childhood and adolescence: Secondary | ICD-10-CM

## 2014-10-02 DIAGNOSIS — R632 Polyphagia: Secondary | ICD-10-CM

## 2014-10-02 DIAGNOSIS — E785 Hyperlipidemia, unspecified: Secondary | ICD-10-CM | POA: Diagnosis not present

## 2014-10-02 DIAGNOSIS — Z23 Encounter for immunization: Secondary | ICD-10-CM

## 2014-10-02 LAB — COMPREHENSIVE METABOLIC PANEL
ALT: 29 U/L (ref 0–53)
AST: 20 U/L (ref 0–37)
Albumin: 4.5 g/dL (ref 3.5–5.2)
Alkaline Phosphatase: 63 U/L (ref 39–117)
BILIRUBIN TOTAL: 0.5 mg/dL (ref 0.2–1.2)
BUN: 24 mg/dL — ABNORMAL HIGH (ref 6–23)
CO2: 23 mEq/L (ref 19–32)
Calcium: 9.9 mg/dL (ref 8.4–10.5)
Chloride: 97 mEq/L (ref 96–112)
Creat: 1.51 mg/dL — ABNORMAL HIGH (ref 0.50–1.35)
Glucose, Bld: 385 mg/dL — ABNORMAL HIGH (ref 70–99)
Potassium: 5.3 mEq/L (ref 3.5–5.3)
SODIUM: 131 meq/L — AB (ref 135–145)
Total Protein: 7.2 g/dL (ref 6.0–8.3)

## 2014-10-02 LAB — POCT GLYCOSYLATED HEMOGLOBIN (HGB A1C): HEMOGLOBIN A1C: 11

## 2014-10-02 LAB — LIPID PANEL
CHOL/HDL RATIO: 6 ratio
CHOLESTEROL: 248 mg/dL — AB (ref 0–200)
HDL: 41 mg/dL (ref >=40)
LDL Cholesterol: 132 mg/dL — ABNORMAL HIGH (ref 0–99)
Triglycerides: 373 mg/dL — ABNORMAL HIGH (ref <150)
VLDL: 75 mg/dL — AB (ref 0–40)

## 2014-10-02 MED ORDER — SITAGLIPTIN PHOSPHATE 100 MG PO TABS: 100.0000 mg | ORAL_TABLET | Freq: Every day | ORAL | Status: DC

## 2014-10-02 MED ORDER — ASPIRIN EC 81 MG PO TBEC: 81.0000 mg | DELAYED_RELEASE_TABLET | Freq: Every day | ORAL | Status: DC

## 2014-10-02 MED ORDER — LISINOPRIL 10 MG PO TABS: 10.0000 mg | ORAL_TABLET | Freq: Every day | ORAL | Status: DC

## 2014-10-02 MED ORDER — LISDEXAMFETAMINE DIMESYLATE 70 MG PO CAPS: 70.0000 mg | ORAL_CAPSULE | Freq: Every day | ORAL | Status: DC

## 2014-10-02 MED ORDER — GLIPIZIDE ER 10 MG PO TB24: 20.0000 mg | ORAL_TABLET | Freq: Every day | ORAL | Status: DC

## 2014-10-02 MED ORDER — LISDEXAMFETAMINE DIMESYLATE 70 MG PO CAPS: 70.0000 mg | ORAL_CAPSULE | ORAL | Status: DC

## 2014-10-02 MED ORDER — GLUCOSE BLOOD VI STRP: ORAL_STRIP | Status: DC

## 2014-10-02 MED ORDER — ZOSTER VACCINE LIVE 19400 UNT/0.65ML ~~LOC~~ SOLR: 0.6500 mL | Freq: Once | SUBCUTANEOUS | Status: DC

## 2014-10-02 MED ORDER — METFORMIN HCL 1000 MG PO TABS: 1000.0000 mg | ORAL_TABLET | Freq: Two times a day (BID) | ORAL | Status: DC

## 2014-10-02 NOTE — Progress Notes (Addendum)
Subjective:    Patient ID: Jeff [name], male    DOB: 1951/03/20, 64 y.o.   MRN: [id number] This chart was scribed for Wardell Honour, MD by Martinique Peace, ED Scribe. The patient was seen in [facility]01. The patient's care was started at 10:00 AM.  Chief Complaint  Patient presents with  . Eye Problem    left eye worse at night (H/O emblyopia)  . Medication Refill    all meds & needs a1c checked     HPI  HPI Comments: Jeff [name] is a 64 y.o. male who presents to the [facility] seeking medication refill. He reports that he has been having more problems with his vision which he explains is worst at night and notes himslef straining a lot more than usual. Pt describes episode 2 weeks ago where he was driving back home one night from being out of town and had to pull over due to increased light sensitivity and irritation. He states he has been trying to get in contact with his opthalmologist for the past 4 weeks in which he was told the earliest he could be seen is May 23rd. History of amblyopia in his left eye and DM. Pt states his blood levels have been running high, 200-300's. He adds that he hasn't had his glipizide in the past 2 months because he hasn't been able to get it refilled. Pt also reports he ran out of his Vyvanse medication yesterday which he states "he cannot function without". No complaints of nausea or vomiting. Pt goes on to state he does have occurrences about every 3-4 weeks where he'll wake up feeling ill, vomit, and feel a lot better. He is not sure why this happens. Pt is non-smoker.    Pt is on Januvia, Metformin, and Glipizide for DM. Last hemoglobin A1C was 3 months ago at 9.3. Lipids were not at goal and had liver irritation that was new and worsening. Pt of Dr. Laney Pastor last seen 7 months ago for medication refill. At that point, he was complaining of new headaches, new fatigue, chronic sore on his scalp, and pain in his left leg. He was also complaining of eye pain and reported that  he had an eye doctor appt in 3 months. At last visit, medications were refilled, suspected he had trochanteritis. Pt was to follow up with eye doctor, as well as referred to see dermatologist for sore on his scalp. He did not follow up here at [facility] 3 months after visit like advised. Pt did not keep his appt with Dr. Talmadge Coventry, he states he works out of town and was not able to make last scheduled visit. He was given an additional 3 months of refills by Dr. Laney Pastor when he called in January. Pt is compliant is controlled drug contact other than he drug Vyvanse that he consistently fills every month but less each time and his due for a refill.   Past Medical History  Diagnosis Date  . Diabetes mellitus   . Hypertension   . Hyperlipidemia   . ADHD (attention deficit hyperactivity disorder)    Allergies  Allergen Reactions  . Flexeril [Cyclobenzaprine Hcl]   . Lidocaine Swelling    throat  . Novocain [Procaine Hcl] Swelling   Current Outpatient Prescriptions on File Prior to Visit  Medication Sig Dispense Refill  . Blood Glucose Monitoring Suppl (ACCU-CHEK COMPACT CARE KIT) KIT Check glucose twice daily 1 each 1  . glipiZIDE (GLUCOTROL XL) 10 MG 24 hr tablet  Take 1 tablet (10 mg total) by mouth daily. NO MORE REFILLS WITHOUT OFFICE VISIT - 2ND NOTICE overdue for DM follow up 15 tablet 0  . glucose blood (ACCU-CHEK COMPACT PLUS) test strip Use as instructed 100 each 12  . lisdexamfetamine (VYVANSE) 70 MG capsule Take 1 capsule (70 mg total) by mouth daily. For 30d after signed 30 capsule 0  . lisinopril (PRINIVIL,ZESTRIL) 10 MG tablet Take 1 tablet (10 mg total) by mouth daily. DUE FOR 3 MOS CHECK UP 90 tablet 0  . meloxicam (MOBIC) 15 MG tablet Take 1 tablet (15 mg total) by mouth daily. NO MORE REFILLS WITHOUT OFFICE VISIT - 2ND NOTICE 15 tablet 0  . metFORMIN (GLUCOPHAGE) 1000 MG tablet TAKE 1 TABLET BY MOUTH TWICE DAILY WITH MEALS 180 tablet 0  . simvastatin (ZOCOR) 20 MG tablet Take 1 tablet  (20 mg total) by mouth daily. PATIENT NEEDS OFFICE VISIT FOR ADDITIONAL REFILLS 30 tablet 0  . sitaGLIPtin (JANUVIA) 100 MG tablet Take 1 tablet (100 mg total) by mouth daily. 90 tablet 1   No current facility-administered medications on file prior to visit.   Filed Vitals:   10/02/14 0935  BP: 100/70  Pulse: 86  Temp: 97.5 F (36.4 C)      Review of Systems  Constitutional: Positive for fatigue. Negative for fever, chills, diaphoresis, activity change, appetite change and unexpected weight change.  Eyes: Negative for visual disturbance.  Respiratory: Negative for shortness of breath.   Cardiovascular: Negative for chest pain and leg swelling.  Gastrointestinal: Negative for nausea and vomiting.  Neurological: Negative for dizziness, syncope, facial asymmetry, weakness, light-headedness and headaches.  Psychiatric/Behavioral: Positive for sleep disturbance.       Objective:   Physical Exam  Constitutional: He is oriented to person, place, and time. He appears well-developed and well-nourished. No distress.  HENT:  Head: Normocephalic and atraumatic.  Eyes: Conjunctivae and EOM are normal.  Neck: Neck supple. No tracheal deviation present.  Cardiovascular: Normal rate.   Pulmonary/Chest: Effort normal. No respiratory distress.  Musculoskeletal: Normal range of motion.  Neurological: He is alert and oriented to person, place, and time.  Skin: Skin is warm and dry.  Psychiatric: He has a normal mood and affect. His behavior is normal.  Nursing note and vitals reviewed.  BP 100/70 mmHg  Pulse 86  Temp(Src) 97.5 F (36.4 C) (Oral)  Ht 5' 8.25" (1.734 m)  Wt 176 lb 2 oz (79.89 kg)  BMI 26.57 kg/m2  SpO2 96%   Results for orders placed or performed in visit on 02/27/14  Comprehensive metabolic panel  Result Value Ref Range   Sodium 137 135 - 145 mEq/L   Potassium 4.6 3.5 - 5.3 mEq/L   Chloride 104 96 - 112 mEq/L   CO2 22 19 - 32 mEq/L   Glucose, Bld 172 (H) 70 - 99  mg/dL   BUN 11 6 - 23 mg/dL   Creat 1.13 0.50 - 1.35 mg/dL   Total Bilirubin 0.4 0.2 - 1.2 mg/dL   Alkaline Phosphatase 55 39 - 117 U/L   AST 54 (H) 0 - 37 U/L   ALT 58 (H) 0 - 53 U/L   Total Protein 6.7 6.0 - 8.3 g/dL   Albumin 4.0 3.5 - 5.2 g/dL   Calcium 9.4 8.4 - 10.5 mg/dL  TSH  Result Value Ref Range   TSH 1.915 0.350 - 4.500 uIU/mL  Lipid panel  Result Value Ref Range   Cholesterol 208 (H) 0 - 200  mg/dL   Triglycerides 333 (H) <150 mg/dL   HDL 40 >39 mg/dL   Total CHOL/HDL Ratio 5.2 Ratio   VLDL 67 (H) 0 - 40 mg/dL   LDL Cholesterol 101 (H) 0 - 99 mg/dL  POCT CBC  Result Value Ref Range   WBC 6.8 4.6 - 10.2 K/uL   Lymph, poc 1.7 0.6 - 3.4   POC LYMPH PERCENT 24.6 10 - 50 %L   MID (cbc) 0.5 0 - 0.9   POC MID % 8.0 0 - 12 %M   POC Granulocyte 4.6 2 - 6.9   Granulocyte percent 67.4 37 - 80 %G   RBC 4.77 4.69 - 6.13 M/uL   Hemoglobin 13.7 (A) 14.1 - 18.1 g/dL   HCT, POC 41.9 (A) 43.5 - 53.7 %   MCV 87.9 80 - 97 fL   MCH, POC 28.8 27 - 31.2 pg   MCHC 32.8 31.8 - 35.4 g/dL   RDW, POC 13.7 %   Platelet Count, POC 234 142 - 424 K/uL   MPV 7.8 0 - 99.8 fL  POCT glycosylated hemoglobin (Hb A1C)  Result Value Ref Range   Hemoglobin A1C 9.3      10:27 AM- Treatment plan was discussed with patient who verbalizes understanding and agrees.      Assessment & Plan:  Drug database reviewed 1. Type 2 diabetes mellitus without complication - pt's diabetes has been uncontrolled for a long tme - pt would consider giving himself injection if necessary - cont metformin and januvia - on max doses, increase glipizide xl from 10 to 20. Work on [place].  Refer to endocrine since may need to start insulin to get a1c at goal.  2. ADD (attention deficit disorder) - vyvanse refilled x 3 mos - will need OV for any additional refills.  3. Night vision loss - concern for cataracts, f/u w/ optho for further eval  4. Polyphagia   5. Polypharmacy   6. Hyperlipidemia LDL goal <100 - LDL at goal  but non-hdl is to high so increase zocor from 20 to 40  7. Encounter for medication review and counseling   8.     CKD - likely due to uncontrolled DM., GFR 48 - will need to decrease meloxicam to 7.5 9.     HM - prevnar given today  Orders Placed This Encounter  Procedures  . Pneumococcal conjugate vaccine 13-valent  . Comprehensive metabolic panel    Order Specific Question:  Has the patient fasted?    Answer:  Yes  . Lipid panel    Order Specific Question:  Has the patient fasted?    Answer:  Yes  . Ambulatory referral to Ophthalmology    Referral Priority:  Routine    Referral Type:  Consultation    Referral Reason:  Specialty Services Required    Requested Specialty:  Ophthalmology    Number of Visits Requested:  1  . Ambulatory referral to Endocrinology    Referral Priority:  Routine    Referral Type:  Consultation    Referral Reason:  Specialty Services Required    Number of Visits Requested:  1  . POCT glycosylated hemoglobin (Hb A1C)    Meds ordered this encounter  Medications  . aspirin EC 81 MG tablet    Sig: Take 1 tablet (81 mg total) by mouth daily.    Dispense:  90 tablet    Refill:  11  . sitaGLIPtin (JANUVIA) 100 MG tablet    Sig: Take 1  tablet (100 mg total) by mouth daily.    Dispense:  90 tablet    Refill:  1  . metFORMIN (GLUCOPHAGE) 1000 MG tablet    Sig: Take 1 tablet (1,000 mg total) by mouth 2 (two) times daily with a meal.    Dispense:  180 tablet    Refill:  1  . lisinopril (PRINIVIL,ZESTRIL) 10 MG tablet    Sig: Take 1 tablet (10 mg total) by mouth daily.    Dispense:  90 tablet    Refill:  1  . lisdexamfetamine (VYVANSE) 70 MG capsule    Sig: Take 1 capsule (70 mg total) by mouth daily. For 30d after signed    Dispense:  30 capsule    Refill:  0  . lisdexamfetamine (VYVANSE) 70 MG capsule    Sig: Take 1 capsule (70 mg total) by mouth daily. For 60d after signed    Dispense:  30 capsule    Refill:  0  . lisdexamfetamine (VYVANSE) 70 MG  capsule    Sig: Take 1 capsule (70 mg total) by mouth every morning.    Dispense:  30 capsule    Refill:  0  . glipiZIDE (GLUCOTROL XL) 10 MG 24 hr tablet    Sig: Take 2 tablets (20 mg total) by mouth daily with breakfast.    Dispense:  180 tablet    Refill:  1  . glucose blood (ACCU-CHEK COMPACT PLUS) test strip    Sig: Use as instructed    Dispense:  100 each    Refill:  12  . zoster vaccine live, PF, (ZOSTAVAX) 29798 UNT/0.65ML injection    Sig: Inject 19,400 Units into the skin once.    Dispense:  1 vial    Refill:  0   Today I have utilized the [place] Controlled Substance Registry's online query to confirm compliance regarding the patient's narcotic pain medications. My review reveals appropriate prescription fills and that [facility] is the sole provider of these medications. Rechecks will occur regularly and the patient is aware of our use of the system.  I personally performed the services described in this documentation, which was scribed in my presence. The recorded information has been reviewed and considered, and addended by me as needed.  Delman Cheadle, MD MPH

## 2014-10-02 NOTE — Patient Instructions (Signed)
Diabetes, Eating Away From Home Sometimes, you might eat in a restaurant or have meals that are prepared by someone else. You can enjoy eating out. However, the portions in restaurants may be much larger than needed. Listed below are some ideas to help you choose foods that will keep your blood glucose (sugar) in better control.  TIPS FOR EATING OUT  Know your meal plan and how many carbohydrate servings you should have at each meal. You may wish to carry a copy of your meal plan in your purse or wallet. Learn the foods included in each food group.  Make a list of restaurants near you that offer healthy choices. Take a copy of the carry-out menus to see what they offer. Then, you can plan what you will order ahead of time.  Become familiar with serving sizes by practicing them at home using measuring cups and spoons. Once you learn to recognize portion sizes, you will be able to correctly estimate the amount of total carbohydrate you are allowed to eat at the restaurant. Ask for a takeout box if the portion is more than you should have. When your food comes, leave the amount you should have on the plate, and put the rest in the takeout box before you start eating.  Plan ahead if your mealtime will be different from usual. Check with your caregiver to find out how to time meals and medicine if you are taking insulin.  Avoid high-fat foods, such as fried foods, cream sauces, high-fat salad dressings, or any added butter or margarine.  Do not be afraid to ask questions. Ask your server about the portion size, cooking methods, ingredients and if items can be substituted. Restaurants do not list all available items on the menu. You can ask for your main entree to be prepared using skim milk, oil instead of butter or margarine, and without gravy or sauces. Ask your waiter or waitress to serve salad dressings, gravy, sauces, margarine, and sour cream on the side. You can then add the amount your meal plan  suggests.  Add more vegetables whenever possible.  Avoid items that are labeled "jumbo," "giant," "deluxe," or "supersized."  You may want to split an entre with someone and order an extra side salad.  Watch for hidden calories in foods like croutons, bacon, or cheese.  Ask your server to take away the bread basket or chips from your table.  Order a dinner salad as an appetizer. You can eat most foods served in a restaurant. Some foods are better choices than others. Breads and Starches  Recommended: All kinds of bread (wheat, rye, white, oatmeal, [place], [place], raisin), hard or soft dinner rolls, frankfurter or hamburger buns, small bagels, small corn or whole-wheat flour tortillas.  Avoid: Frosted or glazed breads, butter rolls, egg or cheese breads, croissants, sweet rolls, pastries, coffee cake, glazed or frosted doughnuts, muffins. Crackers  Recommended: Animal crackers, graham, rye, saltine, oyster, and matzoth crackers. Bread sticks, melba toast, rusks, pretzels, popcorn (without fat), zwieback toast.  Avoid: High-fat snack crackers or chips. Buttered popcorn. Cereals  Recommended: Hot and cold cereals. Whole grains such as oatmeal or shredded wheat are good choices.  Avoid: Sugar-coated or granola type cereals. Potatoes/Pasta/Rice/Beans  Recommended: Order baked, boiled, or mashed potatoes, rice or noodles without added fat, whole beans. Order gravies, butter, margarine, or sauces on the side so you can control the amount you add.  Avoid: Hash browns or fried potatoes. Potatoes, pasta, or rice prepared with cream or  cheese sauce. Potato or pasta salads prepared with large amounts of dressing. Fried beans or fried rice. Vegetables  Recommended: Order steamed, baked, boiled, or stewed vegetables without sauces or extra fat. Ask that sauce be served on the side. If vegetables are not listed on the menu, ask what is available.  Avoid: Vegetables prepared with cream,  butter, or cheese sauce. Fried vegetables. Salad Bars  Recommended: Many of the vegetables at a salad bar are considered "free." Use lemon juice, vinegar, or low-calorie salad dressing (fewer than 20 calories per serving) as "free" dressings for your salad. Look for salad bar ingredients that have no added fat or sugar such as tomatoes, lettuce, cucumbers, broccoli, carrots, onions, and mushrooms.  Avoid: Prepared salads with large amounts of dressing, such as coleslaw, caesar salad, macaroni salad, bean salad, or carrot salad. Fruit  Recommended: Eat fresh fruit or fresh fruit salad without added dressing. A salad bar often offers fresh fruit choices, but canned fruit at a restaurant is usually packed in sugar or syrup.  Avoid: Sweetened canned or frozen fruits, plain or sweetened fruit juice. Fruit salads with dressing, sour cream, or sugar added to them. Meat and Meat Substitutes  Recommended: Order broiled, baked, roasted, or grilled meat, poultry, or fish. Trim off all visible fat. Do not eat the skin of poultry. The size stated on the menu is the raw weight. Meat shrinks by  in cooking (for example, 4 oz raw equals 3 oz cooked meat).  Avoid: Deep-fat fried meat, poultry, or fish. Breaded meats. Eggs  Recommended: Order soft, hard-cooked, poached, or scrambled eggs. Omelets may be okay, depending on what ingredients are added. Egg substitutes are also a good choice.  Avoid: Fried eggs, eggs prepared with cream or cheese sauce. Milk  Recommended: Order low-fat or fat-free milk according to your meal plan. Plain, nonfat yogurt or flavored yogurt with no sugar added may be used as a substitute for milk. Soy milk may also be used.  Avoid: Milk shakes or sweetened milk beverages. Soups and Combination Foods  Recommended: Clear broth or consomm are "free" foods and may be used as an appetizer. Broth-based soups with fat removed count as a starch serving and are preferred over cream  soups. Soups made with beans or split peas may be eaten but count as a starch.  Avoid: Fatty soups, soup made with cream, cheese soup. Combination foods prepared with excessive amounts of fat or with cream or cheese sauces. Desserts and Sweets  Recommended: Ask for fresh fruit. Sponge or angel food cake without icing, ice milk, no sugar added ice cream, sherbet, or frozen yogurt may fit into your meal plan occasionally.  Avoid: Pastries, puddings, pies, cakes with icing, custard, gelatin desserts. Fats and Oils  Recommended: Choose healthy fats such as olive oil, canola oil, or tub margarine, reduced fat or fat-free sour cream, cream cheese, avocado, or nuts.  Avoid: Any fats in excess of your allowed portion. Deep-fried foods or any food with a large amount of fat. Note: Ask for all fats to be served on the side, and limit your portion sizes according to your meal plan. Document Released: 05/21/2005 Document Revised: 08/13/2011 Document Reviewed: 08/18/2013 [facility] Patient Information 2015 [place], [place]. This information is not intended to replace advice given to you by your health care provider. Make sure you discuss any questions you have with your health care provider.   Diabetes and Standards of Medical Care Diabetes is complicated. You may find that your diabetes team  includes a dietitian, nurse, diabetes educator, eye doctor, and more. To help everyone know what is going on and to help you get the care you deserve, the following schedule of care was developed to help keep you on track. Below are the tests, exams, vaccines, medicines, education, and plans you will need. HbA1c test This test shows how well you have controlled your glucose over the past 2-3 months. It is used to see if your diabetes management plan needs to be adjusted.   It is performed at least 2 times a year if you are meeting treatment goals.  It is performed 4 times a year if therapy has changed or if you are not  meeting treatment goals. Blood pressure test  This test is performed at every routine medical visit. The goal is less than 140/90 mm Hg for most people, but 130/80 mm Hg in some cases. Ask your health care provider about your goal. Dental exam  Follow up with the dentist regularly. Eye exam  If you are diagnosed with type 1 diabetes as a child, get an exam upon reaching the age of 81 years or older and have had diabetes for 3-5 years. Yearly eye exams are recommended after that initial eye exam.  If you are diagnosed with type 1 diabetes as an adult, get an exam within 5 years of diagnosis and then yearly.  If you are diagnosed with type 2 diabetes, get an exam as soon as possible after the diagnosis and then yearly. Foot care exam  Visual foot exams are performed at every routine medical visit. The exams check for cuts, injuries, or other problems with the feet.  A comprehensive foot exam should be done yearly. This includes visual inspection as well as assessing foot pulses and testing for loss of sensation.  Check your feet nightly for cuts, injuries, or other problems with your feet. Tell your health care provider if anything is not healing. Kidney function test (urine microalbumin)  This test is performed once a year.  Type 1 diabetes: The first test is performed 5 years after diagnosis.  Type 2 diabetes: The first test is performed at the time of diagnosis.  A serum creatinine and estimated glomerular filtration rate (eGFR) test is done once a year to assess the level of chronic kidney disease (CKD), if present. Lipid profile (cholesterol, HDL, LDL, triglycerides)  Performed every 5 years for most people.  The goal for LDL is less than 100 mg/dL. If you are at high risk, the goal is less than 70 mg/dL.  The goal for HDL is 40 mg/dL-50 mg/dL for men and 50 mg/dL-60 mg/dL for women. An HDL cholesterol of 60 mg/dL or higher gives some protection against heart disease.  The  goal for triglycerides is less than 150 mg/dL. Influenza vaccine, pneumococcal vaccine, and hepatitis B vaccine  The influenza vaccine is recommended yearly.  It is recommended that people with diabetes who are over 59 years old get the pneumonia vaccine. In some cases, two separate shots may be given. Ask your health care provider if your pneumonia vaccination is up to date.  The hepatitis B vaccine is also recommended for adults with diabetes. Diabetes self-management education  Education is recommended at diagnosis and ongoing as needed. Treatment plan  Your treatment plan is reviewed at every medical visit. Document Released: 03/18/2009 Document Revised: 10/05/2013 Document Reviewed: 10/21/2012 [facility] Patient Information 2015 [place], [place]. This information is not intended to replace advice given to you by your health  care provider. Make sure you discuss any questions you have with your health care provider.

## 2014-10-03 ENCOUNTER — Telehealth: Payer: Self-pay

## 2014-10-03 ENCOUNTER — Other Ambulatory Visit: Payer: Self-pay | Admitting: Family Medicine

## 2014-10-03 NOTE — Telephone Encounter (Signed)
Patient requests a refill of meloxicam (MOBIC) 15 MG tablet.  He said he saw Dr. Clelia CroftShaw yesterday (10/02/14), and she did not refill that medication.  Please advise.  Thank you.  CB#: [phone]

## 2014-10-04 NOTE — Telephone Encounter (Signed)
Did you want this to be refilled?

## 2014-10-05 ENCOUNTER — Encounter: Payer: Self-pay | Admitting: Family Medicine

## 2014-10-05 MED ORDER — SIMVASTATIN 40 MG PO TABS: 40.0000 mg | ORAL_TABLET | Freq: Every day | ORAL | Status: DC

## 2014-10-05 MED ORDER — MELOXICAM 7.5 MG PO TABS: 7.5000 mg | ORAL_TABLET | Freq: Every day | ORAL | Status: DC | PRN

## 2014-10-05 NOTE — Addendum Note (Signed)
Addended by: Norberto SorensonSHAW, Laporsche Hoeger on: 10/05/2014 12:55 AM   Modules accepted: Orders

## 2014-10-05 NOTE — Telephone Encounter (Signed)
lmom to cb. 

## 2014-10-05 NOTE — Telephone Encounter (Signed)
Pt notified. He said that since he hs been back on a double dose of glipizide, his fasting sugar yesterday was 198, which is the lowest it has been in 2 months, and yesterday afternoon it was 176. He has been eating really healthy.

## 2014-10-05 NOTE — Telephone Encounter (Signed)
meloxicam refilled but will need to decrease to 7.5mg  qd prn as his kidney irritation is worse than prior and overuse of meloxicam or other nsaids will cause worseninh kidney function .  His chronic kidney disease is likely from DM. Do not use with any other otc pain medication other than tylenol/acetaminophen - so no aleve, ibuprofen, motrin, advil, etc.  He also needs to increase his zocor - take 2 tabs of current until he picks up the new rx.

## 2014-10-15 ENCOUNTER — Encounter: Payer: Self-pay | Admitting: Endocrinology

## 2014-10-15 ENCOUNTER — Ambulatory Visit (INDEPENDENT_AMBULATORY_CARE_PROVIDER_SITE_OTHER): Payer: BC Managed Care – PPO | Admitting: Endocrinology

## 2014-10-15 VITALS — BP 122/80 | HR 81 | Temp 97.9°F | Ht 68.5 in | Wt 177.0 lb

## 2014-10-15 DIAGNOSIS — E119 Type 2 diabetes mellitus without complications: Secondary | ICD-10-CM | POA: Diagnosis not present

## 2014-10-15 NOTE — Patient Instructions (Addendum)
good diet and exercise significantly improve the control of your diabetes.  please let me know if you wish to be referred to a dietician.  high blood sugar is very risky to your health.  you should see an eye doctor and dentist every year.  It is very important to get all recommended vaccinations.  controlling your blood pressure and cholesterol drastically reduces the damage diabetes does to your body.  Those who smoke should quit.  please discuss these with your doctor.  At our office, we are fortunate to have two specialists who are happy to help you:   Cristy FolksLinda Spagnola, RN, CDE, is a diabetes educator.  She is here on Monday mornings, and all day Tuesday and Wednesday.  She is can help you with low blood sugar avoidance and treatment, injecting insulin, sick day management, and others.   Oran ReinLaura Jobe, RD is our dietician.  She is here all day Thursday and Friday.  She can advise you about a healthy diet.  She can also help you about a variety of special diabetes situations, such as shift work, Animal nutritionistvegeterian diet, gluten-free, diet for kidney patients, traveling with diabetes, and help for those who need to gain weight.   check your blood sugar twice a day.  vary the time of day when you check, between before the 3 meals, and at bedtime.  also check if you have symptoms of your blood sugar being too high or too low.  please keep a record of the readings and bring it to your next appointment here.  You can write it on any piece of paper.  please call us sooner if your blood sugar goes below 70, or if you have a lot of readings over 200. Please see Bonita QuinLinda soon, to learn about insulin.  Please continue your renewed dietary effort, and the same medications.   Please come back for a follow-up appointment in 2 months.

## 2014-10-15 NOTE — Progress Notes (Signed)
Subjective:    Patient ID: Jeff [name], male    DOB: [birth year], 64 y.o.   MRN: [id number]  HPI pt states DM was dx'ed in 2005; he has mild neuropathy of the lower extremities; he is unaware of any associated chronic complications; he has never been on insulin; pt says his diet and exercise are very good; he has never had pancreatitis, severe hypoglycemia or DKA.   Pt says his diet is much better over the past few weeks, and cbg's have improved to approx 100. Past Medical History  Diagnosis Date  . Diabetes mellitus   . Hypertension   . Hyperlipidemia   . ADHD (attention deficit hyperactivity disorder)     Past Surgical History  Procedure Laterality Date  . Neck surgery    . Spine surgery      Cervical spine x 2; Hardin Negus; [facility].    History   Social History  . Marital Status: Single    Spouse Name: N/A  . Number of Children: N/A  . Years of Education: N/A   Occupational History  . Press photographer    Social History Main Topics  . Smoking status: Never Smoker   . Smokeless tobacco: Not on file  . Alcohol Use: No  . Drug Use: No  . Sexual Activity: Not on file   Other Topics Concern  . Not on file   Social History Narrative    Current Outpatient Prescriptions on File Prior to Visit  Medication Sig Dispense Refill  . aspirin EC 81 MG tablet Take 1 tablet (81 mg total) by mouth daily. 90 tablet 11  . Blood Glucose Monitoring Suppl (ACCU-CHEK COMPACT CARE KIT) KIT Check glucose twice daily 1 each 1  . glipiZIDE (GLUCOTROL XL) 10 MG 24 hr tablet Take 2 tablets (20 mg total) by mouth daily with breakfast. 180 tablet 1  . glucose blood (ACCU-CHEK COMPACT PLUS) test strip Use as instructed 100 each 12  . lisdexamfetamine (VYVANSE) 70 MG capsule Take 1 capsule (70 mg total) by mouth daily. For 30d after signed 30 capsule 0  . lisinopril (PRINIVIL,ZESTRIL) 10 MG tablet Take 1 tablet (10 mg total) by mouth daily. 90 tablet 1  . meloxicam (MOBIC) 7.5 MG  tablet Take 1 tablet (7.5 mg total) by mouth daily as needed for pain. 30 tablet 2  . metFORMIN (GLUCOPHAGE) 1000 MG tablet Take 1 tablet (1,000 mg total) by mouth 2 (two) times daily with a meal. 180 tablet 1  . simvastatin (ZOCOR) 40 MG tablet Take 1 tablet (40 mg total) by mouth daily at 6 PM. 90 tablet 1  . sitaGLIPtin (JANUVIA) 100 MG tablet Take 1 tablet (100 mg total) by mouth daily. 90 tablet 1  . zoster vaccine live, PF, (ZOSTAVAX) 09323 UNT/0.65ML injection Inject 19,400 Units into the skin once. 1 vial 0   No current facility-administered medications on file prior to visit.    Allergies  Allergen Reactions  . Flexeril [Cyclobenzaprine Hcl]   . Lidocaine Swelling    throat  . Novocain [Procaine Hcl] Swelling    Family History  Problem Relation Age of Onset  . Heart disease Mother     CHF  . Diabetes Mother   . Heart disease Father   . Cancer Brother     BP 122/80 mmHg  Pulse 81  Temp(Src) 97.9 F (36.6 C) (Oral)  Ht 5' 8.5" (1.74 m)  Wt 177 lb (80.287 kg)  BMI 26.52 kg/m2  SpO2 92%  Review of Systems denies headache, chest pain, sob, n/v, urinary frequency, muscle cramps, excessive diaphoresis, depression, cold intolerance, rhinorrhea, and easy bruising.  He has lost weight, due to his efforts.  He has chronic blurry vision, cold intolerance, and leg cramps.      Objective:   Physical Exam VS: see vs page GEN: no distress HEAD: head: no deformity eyes: no periorbital swelling, no proptosis external nose and ears are normal mouth: no lesion seen NECK: Neck: a healed scar is present at the posterior neck.  i do not appreciate a nodule in the thyroid or elsewhere in the neck.  CHEST WALL: no deformity LUNGS: clear to auscultation BREASTS:  No gynecomastia CV: reg rate and rhythm, no murmur ABD: abdomen is soft, nontender.  no hepatosplenomegaly.  not distended.  no hernia MUSCULOSKELETAL: muscle bulk and strength are grossly normal.  no obvious joint  swelling.  gait is normal and steady EXTEMITIES: no deformity.  no ulcer on the feet.  feet are of normal color and temp.  no edema PULSES: dorsalis pedis intact bilat.  no carotid bruit NEURO:  cn 2-12 grossly intact.   readily moves all 4's.  sensation is intact to touch on the feet SKIN:  Normal texture and temperature.  No rash or suspicious lesion is visible.   NODES:  None palpable at the neck PSYCH: alert, well-oriented.  Does not appear anxious nor depressed.   Lab Results  Component Value Date   HGBA1C 11.0 10/02/2014      Assessment & Plan:  DM: severe exacerbation: he declines insulin for now.  However, he should at least learn about injecting insulin, in case of emergency.  Pt says glycemic control is much better, with his renewed dietary effort.   Blurry vision, new, prob due to severe hyperglycemia: he is advised to continue f/u with opthal.    Patient is advised the following: Patient Instructions  good diet and exercise significantly improve the control of your diabetes.  please let me know if you wish to be referred to a dietician.  high blood sugar is very risky to your health.  you should see an eye doctor and dentist every year.  It is very important to get all recommended vaccinations.  controlling your blood pressure and cholesterol drastically reduces the damage diabetes does to your body.  Those who smoke should quit.  please discuss these with your doctor.  At our office, we are fortunate to have two specialists who are happy to help you:   Leonia Reader, RN, CDE, is a diabetes educator.  She is here on Monday mornings, and all day Tuesday and Wednesday.  She is can help you with low blood sugar avoidance and treatment, injecting insulin, sick day management, and others.   Antonieta Iba, RD is our dietician.  She is here all day Thursday and Friday.  She can advise you about a healthy diet.  She can also help you about a variety of special diabetes situations, such as  shift work, Actor, gluten-free, diet for kidney patients, traveling with diabetes, and help for those who need to gain weight.   check your blood sugar twice a day.  vary the time of day when you check, between before the 3 meals, and at bedtime.  also check if you have symptoms of your blood sugar being too high or too low.  please keep a record of the readings and bring it to your next appointment here.  You can write it  on any piece of paper.  please call us sooner if your blood sugar goes below 70, or if you have a lot of readings over 200. Please see Vaughan Basta soon, to learn about insulin.  Please continue your renewed dietary effort, and the same medications.   Please come back for a follow-up appointment in 2 months.

## 2014-11-08 ENCOUNTER — Telehealth: Payer: Self-pay | Admitting: Nutrition

## 2014-11-08 ENCOUNTER — Encounter: Payer: BC Managed Care – PPO | Attending: Endocrinology | Admitting: Nutrition

## 2014-11-08 ENCOUNTER — Telehealth: Payer: Self-pay

## 2014-11-08 DIAGNOSIS — Z794 Long term (current) use of insulin: Secondary | ICD-10-CM | POA: Insufficient documentation

## 2014-11-08 DIAGNOSIS — E1165 Type 2 diabetes mellitus with hyperglycemia: Secondary | ICD-10-CM | POA: Insufficient documentation

## 2014-11-08 DIAGNOSIS — IMO0002 Reserved for concepts with insufficient information to code with codable children: Secondary | ICD-10-CM

## 2014-11-08 DIAGNOSIS — Z713 Dietary counseling and surveillance: Secondary | ICD-10-CM | POA: Diagnosis not present

## 2014-11-08 MED ORDER — INSULIN LISPRO 100 UNIT/ML ~~LOC~~ SOLN: SUBCUTANEOUS | Status: DC

## 2014-11-08 MED ORDER — INSULIN ASPART 100 UNIT/ML ~~LOC~~ SOLN: SUBCUTANEOUS | Status: DC

## 2014-11-08 NOTE — Telephone Encounter (Signed)
Rx changed to Novolog.

## 2014-11-08 NOTE — Telephone Encounter (Signed)
Patient advised of instructions below at visit with Bonita QuinLinda today.

## 2014-11-08 NOTE — Progress Notes (Signed)
He was shown an insulin pen, and how/where to inject.  He reported good understanding of this, but wanted information on insulin pumps.  We discussed them, and he was shown the different models.  He liked the OmniPod.  When Dr. Loanne Drilling was asked if this would be ok, he suggested a V-go.  He was shown this, and we discussed how it works, how to fill, apply and use it.   He re demonstrated how to fill this, using Novolog insulin, and applied it to his upper Left abdomen.  He was instructed not to give any premeal insulins and he reported good understanding of this.   He was instructed to test his blood sugars before meals and at bedtime.  He will return on Wednesday with readings to see Dr. Loanne Drilling He was given a V-go 20 starter kit with instructions for use, and a phone number if questions.  We filled out a benefit research form and faxed it to [facility] customer care  He was also given a co-pay card to use for this. He had no final questions.

## 2014-11-08 NOTE — Telephone Encounter (Signed)
Received a fax from the pt's pharmacy stating Humalog is not covered under his insurance. Covered alternative is Novolog. Please advise, Thanks!

## 2014-11-08 NOTE — Telephone Encounter (Signed)
Would like to start on a V-go

## 2014-11-08 NOTE — Telephone Encounter (Signed)
ok 

## 2014-11-08 NOTE — Telephone Encounter (Signed)
i have sent a prescription to your pharmacy, for humalog.   Please continue the same DM oral medications for now.  Please come back for a follow-up appointment in 2 days.

## 2014-11-09 NOTE — Patient Instructions (Signed)
Apply a new filled V-Go every 24 hours. Test blood sugars before meals and at bedtime Return on Wednesday.

## 2014-11-10 ENCOUNTER — Ambulatory Visit (INDEPENDENT_AMBULATORY_CARE_PROVIDER_SITE_OTHER): Payer: BC Managed Care – PPO | Admitting: Endocrinology

## 2014-11-10 ENCOUNTER — Encounter: Payer: Self-pay | Admitting: Endocrinology

## 2014-11-10 ENCOUNTER — Encounter: Payer: BC Managed Care – PPO | Admitting: Nutrition

## 2014-11-10 ENCOUNTER — Ambulatory Visit: Payer: BC Managed Care – PPO | Admitting: Endocrinology

## 2014-11-10 ENCOUNTER — Telehealth: Payer: Self-pay

## 2014-11-10 VITALS — BP 122/80 | HR 83 | Temp 97.6°F | Ht 68.5 in | Wt 182.0 lb

## 2014-11-10 DIAGNOSIS — E0900 Drug or chemical induced diabetes mellitus with hyperosmolarity without nonketotic hyperglycemic-hyperosmolar coma (NKHHC): Secondary | ICD-10-CM

## 2014-11-10 DIAGNOSIS — E1165 Type 2 diabetes mellitus with hyperglycemia: Secondary | ICD-10-CM | POA: Diagnosis not present

## 2014-11-10 MED ORDER — INSULIN ASPART 100 UNIT/ML ~~LOC~~ SOLN: SUBCUTANEOUS | Status: DC

## 2014-11-10 MED ORDER — V-GO 20 KIT: PACK | Status: DC

## 2014-11-10 NOTE — Progress Notes (Signed)
Subjective:    Patient ID: Jeff [name], male    DOB: 07/23/1950, 64 y.o.   MRN: [id number]  HPI Pt returns for f/u of diabetes mellitus: DM type: Insulin-requiring type 2 Dx'ed: 8768 Complications: none Therapy: insulin since 2016 DKA: never Severe hypoglycemia: never Pancreatitis: never Other: he started V-GO pump a days ago Interval history: no cbg record, but states cbg's vary from 97-208.  It is in general higher as the day goes on, but not necessarily so. Past Medical History  Diagnosis Date  . Diabetes mellitus   . Hypertension   . Hyperlipidemia   . ADHD (attention deficit hyperactivity disorder)     Past Surgical History  Procedure Laterality Date  . Neck surgery    . Spine surgery      Cervical spine x 2; Hardin Negus; [facility].    History   Social History  . Marital Status: Single    Spouse Name: N/A  . Number of Children: N/A  . Years of Education: N/A   Occupational History  . Press photographer    Social History Main Topics  . Smoking status: Never Smoker   . Smokeless tobacco: Not on file  . Alcohol Use: No  . Drug Use: No  . Sexual Activity: Not on file   Other Topics Concern  . Not on file   Social History Narrative    Current Outpatient Prescriptions on File Prior to Visit  Medication Sig Dispense Refill  . aspirin EC 81 MG tablet Take 1 tablet (81 mg total) by mouth daily. 90 tablet 11  . Blood Glucose Monitoring Suppl (ACCU-CHEK COMPACT CARE KIT) KIT Check glucose twice daily 1 each 1  . glucose blood (ACCU-CHEK COMPACT PLUS) test strip Use as instructed 100 each 12  . lisdexamfetamine (VYVANSE) 70 MG capsule Take 1 capsule (70 mg total) by mouth daily. For 30d after signed 30 capsule 0  . lisinopril (PRINIVIL,ZESTRIL) 10 MG tablet Take 1 tablet (10 mg total) by mouth daily. 90 tablet 1  . meloxicam (MOBIC) 7.5 MG tablet Take 1 tablet (7.5 mg total) by mouth daily as needed for pain. 30 tablet 2  . metFORMIN (GLUCOPHAGE) 1000  MG tablet Take 1 tablet (1,000 mg total) by mouth 2 (two) times daily with a meal. 180 tablet 1  . simvastatin (ZOCOR) 40 MG tablet Take 1 tablet (40 mg total) by mouth daily at 6 PM. 90 tablet 1  . sitaGLIPtin (JANUVIA) 100 MG tablet Take 1 tablet (100 mg total) by mouth daily. 90 tablet 1  . zoster vaccine live, PF, (ZOSTAVAX) 11572 UNT/0.65ML injection Inject 19,400 Units into the skin once. 1 vial 0   No current facility-administered medications on file prior to visit.    Allergies  Allergen Reactions  . Flexeril [Cyclobenzaprine Hcl]   . Lidocaine Swelling    throat  . Novocain [Procaine Hcl] Swelling    Family History  Problem Relation Age of Onset  . Heart disease Mother     CHF  . Diabetes Mother   . Heart disease Father   . Cancer Brother     BP 122/80 mmHg  Pulse 83  Temp(Src) 97.6 F (36.4 C) (Oral)  Ht 5' 8.5" (1.74 m)  Wt 182 lb (82.555 kg)  BMI 27.27 kg/m2  SpO2 97%  Review of Systems He denies hypoglycemia    Objective:   Physical Exam VITAL SIGNS:  See vs page GENERAL: no distress SKIN:  Insulin infusion sites at the anterior abdomen are  normal     Assessment & Plan:  DM: good start on V-GO so far  Patient is advised the following: Patient Instructions  Please continue the same V-GO settings Please stop taking the glipizide. Please call in 2 days, to tell us how the blood sugar is doing. check your blood sugar twice a day.  vary the time of day when you check, between before the 3 meals, and at bedtime.  also check if you have symptoms of your blood sugar being too high or too low.  please keep a record of the readings and bring it to your next appointment here.  You can write it on any piece of paper.  please call us sooner if your blood sugar goes below 70, or if you have a lot of readings over 200. Please come back for a follow-up appointment in 1 month.

## 2014-11-10 NOTE — Telephone Encounter (Signed)
Opened in error

## 2014-11-10 NOTE — Progress Notes (Signed)
Patient reports no difficulty filling and applying the V-Go.  He is not bolusing for his meals, will continue this until he calls his blood sugars in on Friday.   We reviewed his diet--he has stopped the cereal and milk, and now all meals are balanced and lower in fat and servings sizes of proteins and carbs are almost half of what they were before starting on the V-Go.

## 2014-11-10 NOTE — Patient Instructions (Signed)
Continue to apply a new V-Go every day Give no meal time insulin Test before all meals and at bedtime, and call readings on Friday

## 2014-11-10 NOTE — Telephone Encounter (Signed)
Rx for V-GO 20 sent per Linda's request.

## 2014-11-10 NOTE — Patient Instructions (Signed)
Please continue the same V-GO settings Please stop taking the glipizide. Please call in 2 days, to tell us how the blood sugar is doing. check your blood sugar twice a day.  vary the time of day when you check, between before the 3 meals, and at bedtime.  also check if you have symptoms of your blood sugar being too high or too low.  please keep a record of the readings and bring it to your next appointment here.  You can write it on any piece of paper.  please call us sooner if your blood sugar goes below 70, or if you have a lot of readings over 200. Please come back for a follow-up appointment in 1 month.

## 2014-11-12 ENCOUNTER — Ambulatory Visit: Payer: BC Managed Care – PPO | Admitting: Endocrinology

## 2014-11-20 ENCOUNTER — Other Ambulatory Visit: Payer: Self-pay | Admitting: Physician Assistant

## 2014-11-20 ENCOUNTER — Other Ambulatory Visit: Payer: Self-pay | Admitting: Family Medicine

## 2014-12-15 ENCOUNTER — Encounter: Payer: BC Managed Care – PPO | Admitting: Internal Medicine

## 2014-12-17 ENCOUNTER — Ambulatory Visit: Payer: Self-pay | Admitting: Endocrinology

## 2015-01-03 ENCOUNTER — Other Ambulatory Visit: Payer: Self-pay | Admitting: Family Medicine

## 2015-01-08 ENCOUNTER — Other Ambulatory Visit: Payer: Self-pay | Admitting: Endocrinology

## 2015-01-17 ENCOUNTER — Ambulatory Visit (INDEPENDENT_AMBULATORY_CARE_PROVIDER_SITE_OTHER): Payer: BC Managed Care – PPO | Admitting: Endocrinology

## 2015-01-17 ENCOUNTER — Encounter: Payer: Self-pay | Admitting: Endocrinology

## 2015-01-17 ENCOUNTER — Telehealth: Payer: Self-pay

## 2015-01-17 VITALS — BP 122/80 | HR 87 | Temp 97.9°F | Ht 68.5 in | Wt 180.0 lb

## 2015-01-17 DIAGNOSIS — E0900 Drug or chemical induced diabetes mellitus with hyperosmolarity without nonketotic hyperglycemic-hyperosmolar coma (NKHHC): Secondary | ICD-10-CM

## 2015-01-17 LAB — POCT GLYCOSYLATED HEMOGLOBIN (HGB A1C): HEMOGLOBIN A1C: 7.1

## 2015-01-17 MED ORDER — INSULIN ASPART 100 UNIT/ML ~~LOC~~ SOLN: SUBCUTANEOUS | Status: DC

## 2015-01-17 NOTE — Telephone Encounter (Signed)
Pt in need of his VYVANSE 70 MG. Please call [phone] when ready

## 2015-01-17 NOTE — Patient Instructions (Addendum)
Please continue same V-GO settings. check your blood sugar twice a day.  vary the time of day when you check, between before the 3 meals, and at bedtime.  also check if you have symptoms of your blood sugar being too high or too low.  please keep a record of the readings and bring it to your next appointment here.  You can write it on any piece of paper.  please call us sooner if your blood sugar goes below 70, or if you have a lot of readings over 200. Please come back for a follow-up appointment in 4 months.

## 2015-01-17 NOTE — Progress Notes (Signed)
Subjective:    Patient ID: Jeff [name], male    DOB: Aug 29, 1950, 64 y.o.   MRN: [id number]  HPI Pt returns for f/u of diabetes mellitus: DM type: Insulin-requiring type 2 Dx'ed: 5573 Complications: painful polyneuropathy. Therapy: insulin since 2016 DKA: never Severe hypoglycemia: never Pancreatitis: never Other: he started V-GO-20 pump in early 2016.   Interval history: he does not take any mealtime boluses.  no cbg record, but states cbg's vary from 100-200.  It is in general higher as the day goes on, but not necessarily so.  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  He seldom has hypoglycemia, and these episodes are mild.  He says foot pain is much less now.   Past Medical History  Diagnosis Date  . Diabetes mellitus   . Hypertension   . Hyperlipidemia   . ADHD (attention deficit hyperactivity disorder)     Past Surgical History  Procedure Laterality Date  . Neck surgery    . Spine surgery      Cervical spine x 2; Hardin Negus; [facility].    Social History   Social History  . Marital Status: Single    Spouse Name: N/A  . Number of Children: N/A  . Years of Education: N/A   Occupational History  . Press photographer    Social History Main Topics  . Smoking status: Never Smoker   . Smokeless tobacco: Not on file  . Alcohol Use: No  . Drug Use: No  . Sexual Activity: Not on file   Other Topics Concern  . Not on file   Social History Narrative    Current Outpatient Prescriptions on File Prior to Visit  Medication Sig Dispense Refill  . aspirin EC 81 MG tablet Take 1 tablet (81 mg total) by mouth daily. 90 tablet 11  . Blood Glucose Monitoring Suppl (ACCU-CHEK COMPACT CARE KIT) KIT Check glucose twice daily 1 each 1  . glucose blood (ACCU-CHEK COMPACT PLUS) test strip Use as instructed 100 each 12  . Insulin Disposable Pump (V-GO 20) KIT Use to give insulin. 1 device per day 1 kit 2  . lisdexamfetamine (VYVANSE) 70 MG capsule Take 1 capsule (70 mg  total) by mouth daily. For 30d after signed 30 capsule 0  . lisinopril (PRINIVIL,ZESTRIL) 10 MG tablet Take 1 tablet (10 mg total) by mouth daily. 90 tablet 1  . meloxicam (MOBIC) 7.5 MG tablet TAKE 1 TABLET(7.5 MG) BY MOUTH DAILY AS NEEDED FOR PAIN 30 tablet 2  . metFORMIN (GLUCOPHAGE) 1000 MG tablet Take 1 tablet (1,000 mg total) by mouth 2 (two) times daily with a meal. 180 tablet 1  . metFORMIN (GLUCOPHAGE) 1000 MG tablet TAKE 1 TABLET BY MOUTH TWICE DAILY WITH MEALS 180 tablet 0  . NOVOLOG 100 UNIT/ML injection INJECT 30 UNITS UNDER THE SKIN DAILY FOR USE IN PUMP, AS DIRECTED 10 mL 0  . simvastatin (ZOCOR) 40 MG tablet Take 1 tablet (40 mg total) by mouth daily at 6 PM. 90 tablet 1  . sitaGLIPtin (JANUVIA) 100 MG tablet Take 1 tablet (100 mg total) by mouth daily. 90 tablet 1  . zoster vaccine live, PF, (ZOSTAVAX) 22025 UNT/0.65ML injection Inject 19,400 Units into the skin once. 1 vial 0   No current facility-administered medications on file prior to visit.    Allergies  Allergen Reactions  . Flexeril [Cyclobenzaprine Hcl]   . Lidocaine Swelling    throat  . Novocain [Procaine Hcl] Swelling    Family History  Problem Relation  Age of Onset  . Heart disease Mother     CHF  . Diabetes Mother   . Heart disease Father   . Cancer Brother     BP 122/80 mmHg  Pulse 87  Temp(Src) 97.9 F (36.6 C) (Oral)  Ht 5' 8.5" (1.74 m)  Wt 180 lb (81.647 kg)  BMI 26.97 kg/m2  SpO2 97%   Review of Systems Denies LOC.      Objective:   Physical Exam VITAL SIGNS:  See vs page GENERAL: no distress Pulses: dorsalis pedis intact bilat.   MSK: no deformity of the feet CV: no leg edema Skin:  no ulcer on the feet.  normal color and temp on the feet. Neuro: sensation is intact to touch on the feet.     A1c=7.1%    Assessment & Plan:  DM: well-controlled:  we won't add mealtime boluses, due to upcoming officiating.  Neuropathic pain, improved.  We'll follow.    Patient is advised  the following: Patient Instructions  Please continue same V-GO settings. check your blood sugar twice a day.  vary the time of day when you check, between before the 3 meals, and at bedtime.  also check if you have symptoms of your blood sugar being too high or too low.  please keep a record of the readings and bring it to your next appointment here.  You can write it on any piece of paper.  please call us sooner if your blood sugar goes below 70, or if you have a lot of readings over 200. Please come back for a follow-up appointment in 4 months.

## 2015-01-18 MED ORDER — LISDEXAMFETAMINE DIMESYLATE 70 MG PO CAPS
70.0000 mg | ORAL_CAPSULE | Freq: Every day | ORAL | Status: DC
Start: 2015-01-18 — End: 2015-02-22

## 2015-01-18 NOTE — Telephone Encounter (Signed)
Rx printed at 104.  Meds ordered this encounter  Medications  . lisdexamfetamine (VYVANSE) 70 MG capsule    Sig: Take 1 capsule (70 mg total) by mouth daily. For 30d after signed    Dispense:  30 capsule    Refill:  0    Order Specific Question:  Supervising Provider    Answer:  DOOLITTLE, ROBERT P [[id number]]

## 2015-01-18 NOTE — Telephone Encounter (Signed)
Left message on home VM letting pt know.

## 2015-01-18 NOTE — Telephone Encounter (Signed)
Needs f/u OV for additional refills. Ok to refill x 1 if needed to give pt time to make an appt - will ask PA to write for this as I am not in office. thanks

## 2015-02-22 ENCOUNTER — Ambulatory Visit (INDEPENDENT_AMBULATORY_CARE_PROVIDER_SITE_OTHER): Payer: BC Managed Care – PPO | Admitting: Family Medicine

## 2015-02-22 VITALS — BP 110/70 | HR 81 | Temp 97.6°F | Resp 18 | Ht 68.5 in | Wt 185.1 lb

## 2015-02-22 DIAGNOSIS — F988 Other specified behavioral and emotional disorders with onset usually occurring in childhood and adolescence: Secondary | ICD-10-CM

## 2015-02-22 DIAGNOSIS — F909 Attention-deficit hyperactivity disorder, unspecified type: Secondary | ICD-10-CM

## 2015-02-22 MED ORDER — LISDEXAMFETAMINE DIMESYLATE 70 MG PO CAPS: 70.0000 mg | ORAL_CAPSULE | Freq: Every day | ORAL | Status: DC

## 2015-02-22 MED ORDER — LISINOPRIL 10 MG PO TABS: 10.0000 mg | ORAL_TABLET | Freq: Every day | ORAL | Status: DC

## 2015-02-22 NOTE — Progress Notes (Signed)
 Chief Complaint:  Chief Complaint  Patient presents with  . Medication Refill    HPI: Jeff [name] is a 64 y.o. male who reports to [facility] today for Vyvanse refills He is doing well. He states that he has no SEs fromt he medicine, he was previously on adderall and also ritalin and they made him jittery, as if he was high and then crashed.  He does not feel this way on Vyvanse. Denies CP , palpitations, SI/HI/hallucinations.   He has DM, followed by Dr Loanne Drilling, on insulin pump He feels ok, does have occ neuropathy      Lab Results  Component Value Date   HGBA1C 7.1 01/17/2015   HGBA1C 11.0 10/02/2014   HGBA1C 9.3 02/27/2014   Lab Results  Component Value Date   MICROALBUR 0.50 11/22/2013   LDLCALC 132* 10/02/2014   CREATININE 1.51* 10/02/2014     Past Medical History  Diagnosis Date  . Diabetes mellitus   . Hypertension   . Hyperlipidemia   . ADHD (attention deficit hyperactivity disorder)    Past Surgical History  Procedure Laterality Date  . Neck surgery    . Spine surgery      Cervical spine x 2; Hardin Negus; [facility].   Social History   Social History  . Marital Status: Single    Spouse Name: N/A  . Number of Children: N/A  . Years of Education: N/A   Occupational History  . Press photographer    Social History Main Topics  . Smoking status: Never Smoker   . Smokeless tobacco: None  . Alcohol Use: No  . Drug Use: No  . Sexual Activity: Not Asked   Other Topics Concern  . None   Social History Narrative   Family History  Problem Relation Age of Onset  . Heart disease Mother     CHF  . Diabetes Mother   . Heart disease Father   . Cancer Brother    Allergies  Allergen Reactions  . Flexeril [Cyclobenzaprine Hcl]   . Lidocaine Swelling    throat  . Novocain [Procaine Hcl] Swelling   Prior to Admission medications   Medication Sig Start Date End Date Taking? Authorizing Boysie Bonebrake  aspirin EC 81 MG tablet Take 1 tablet (81 mg  total) by mouth daily. 10/02/14  Yes Shawnee Knapp, MD  Blood Glucose Monitoring Suppl (ACCU-CHEK COMPACT CARE KIT) KIT Check glucose twice daily 12/17/12  Yes Leandrew Koyanagi, MD  glucose blood (ACCU-CHEK COMPACT PLUS) test strip Use as instructed 10/02/14  Yes Shawnee Knapp, MD  insulin aspart (NOVOLOG) 100 UNIT/ML injection Inject a total of 30 units under the skin daily for use in pump. 01/17/15  Yes Renato Shin, MD  Insulin Disposable Pump (V-GO 20) KIT Use to give insulin. 1 device per day 11/10/14  Yes Renato Shin, MD  lisdexamfetamine (VYVANSE) 70 MG capsule Take 1 capsule (70 mg total) by mouth daily. For 30d after signed 01/18/15  Yes Chelle Jeffery, PA-C  lisinopril (PRINIVIL,ZESTRIL) 10 MG tablet Take 1 tablet (10 mg total) by mouth daily. 10/02/14  Yes Shawnee Knapp, MD  meloxicam (MOBIC) 7.5 MG tablet TAKE 1 TABLET(7.5 MG) BY MOUTH DAILY AS NEEDED FOR PAIN 01/03/15  Yes Shawnee Knapp, MD  metFORMIN (GLUCOPHAGE) 1000 MG tablet Take 1 tablet (1,000 mg total) by mouth 2 (two) times daily with a meal. 10/02/14  Yes Shawnee Knapp, MD  simvastatin (ZOCOR) 40 MG tablet Take 1 tablet (  40 mg total) by mouth daily at 6 PM. 10/05/14  Yes Shawnee Knapp, MD  sitaGLIPtin (JANUVIA) 100 MG tablet Take 1 tablet (100 mg total) by mouth daily. 10/02/14  Yes Shawnee Knapp, MD  zoster vaccine live, PF, (ZOSTAVAX) 11572 UNT/0.65ML injection Inject 19,400 Units into the skin once. 10/02/14   Shawnee Knapp, MD     ROS: The patient denies fevers, chills, night sweats, unintentional weight loss, chest pain, palpitations, wheezing, dyspnea on exertion, nausea, vomiting, abdominal pain, dysuria, hematuria, melena, numbness, weakness, or tingling.   All other systems have been reviewed and were otherwise negative with the exception of those mentioned in the HPI and as above.    PHYSICAL EXAM: Filed Vitals:   02/22/15 2029  BP: 110/70  Pulse: 81  Temp: 97.6 F (36.4 C)  Resp: 18   Body mass index is 27.74 kg/(m^2).   General: Alert,  no acute distress HEENT:  Normocephalic, atraumatic, oropharynx patent. EOMI, PERRLA Cardiovascular:  Regular rate and rhythm, no rubs murmurs or gallops.   Respiratory: Clear to auscultation bilaterally.  No wheezes, rales, or rhonchi.  No cyanosis, no use of accessory musculature Abdominal: No organomegaly, abdomen is soft and non-tender, positive bowel sounds. No masses. Skin: No rashes. Neurologic: Facial musculature symmetric. Psychiatric: Patient acts appropriately throughout our interaction. Lymphatic: No cervical or submandibular lymphadenopathy Musculoskeletal: Gait intact. No edema, tenderness   LABS: Results for orders placed or performed in visit on 01/17/15  POCT glycosylated hemoglobin (Hb A1C)  Result Value Ref Range   Hemoglobin A1C 7.1      EKG/XRAY:   Primary read interpreted by Dr. Marin Comment at [facility].   ASSESSMENT/PLAN: Encounter Diagnosis  Name Primary?  . ADD (attention deficit disorder) Yes   Refilled Vyvase for 3 months, if he needs additional 3 months then I can rx him Vyvanse  He needs to let me know however he is hoping to see Dr Laney Pastor for an annual before Dr Verne Carrow.  Fu in 6 months or sooner for annual   Gross sideeffects, risk and benefits, and alternatives of medications d/w patient. Patient is aware that all medications have potential sideeffects and we are unable to predict every sideeffect or drug-drug interaction that may occur.    DO  02/22/2015 9:41 PM

## 2015-03-18 ENCOUNTER — Other Ambulatory Visit: Payer: Self-pay | Admitting: Endocrinology

## 2015-03-18 NOTE — Telephone Encounter (Signed)
Patient ask if you could send V20 insulin pump to  [facility] [phone] - [place] - [address] [facility] [address] [phone] (Phone) [phone] (Fax)

## 2015-04-04 ENCOUNTER — Telehealth: Payer: Self-pay

## 2015-04-04 NOTE — Telephone Encounter (Signed)
Patient would like to know what his cholesterol level was from when he last had it check. Please call! [phone]

## 2015-04-04 NOTE — Telephone Encounter (Signed)
LMOM of chol results from last OV

## 2015-04-18 ENCOUNTER — Other Ambulatory Visit: Payer: Self-pay | Admitting: Family Medicine

## 2015-04-19 ENCOUNTER — Other Ambulatory Visit: Payer: Self-pay | Admitting: Family Medicine

## 2015-04-24 ENCOUNTER — Other Ambulatory Visit: Payer: Self-pay | Admitting: Endocrinology

## 2015-04-25 NOTE — Telephone Encounter (Signed)
Please advise if ok to refill. Rx is not on current med list. 

## 2015-05-02 ENCOUNTER — Encounter: Payer: Self-pay | Admitting: Internal Medicine

## 2015-05-14 ENCOUNTER — Other Ambulatory Visit: Payer: Self-pay | Admitting: Family Medicine

## 2015-05-16 ENCOUNTER — Other Ambulatory Visit: Payer: Self-pay | Admitting: Internal Medicine

## 2015-05-20 ENCOUNTER — Ambulatory Visit (INDEPENDENT_AMBULATORY_CARE_PROVIDER_SITE_OTHER): Payer: BC Managed Care – PPO | Admitting: Endocrinology

## 2015-05-20 ENCOUNTER — Encounter: Payer: Self-pay | Admitting: Endocrinology

## 2015-05-20 VITALS — BP 126/84 | HR 105 | Temp 97.7°F | Ht 68.5 in | Wt 182.0 lb

## 2015-05-20 DIAGNOSIS — E0900 Drug or chemical induced diabetes mellitus with hyperosmolarity without nonketotic hyperglycemic-hyperosmolar coma (NKHHC): Secondary | ICD-10-CM | POA: Diagnosis not present

## 2015-05-20 LAB — POCT GLYCOSYLATED HEMOGLOBIN (HGB A1C): HEMOGLOBIN A1C: 9.1

## 2015-05-20 MED ORDER — GLUCOSE BLOOD VI STRP: 1.0000 | ORAL_STRIP | Freq: Two times a day (BID) | Status: DC

## 2015-05-20 NOTE — Patient Instructions (Addendum)
For your V-GO, please start taking a "click" (which is 2 units), for any big meal. check your blood sugar twice a day.  vary the time of day when you check, between before the 3 meals, and at bedtime.  also check if you have symptoms of your blood sugar being too high or too low.  please keep a record of the readings and bring it to your next appointment here.  You can write it on any piece of paper.  please call us sooner if your blood sugar goes below 70, or if you have a lot of readings over 200.  Please come back for a follow-up appointment in 2 months.

## 2015-05-20 NOTE — Progress Notes (Signed)
Subjective:    Patient ID: Jeff [name], male    DOB: 10-06-1950, 64 y.o.   MRN: [id number]  HPI Pt returns for f/u of diabetes mellitus: DM type: Insulin-requiring type 2 Dx'ed: 3888 Complications: painful polyneuropathy. Therapy: insulin since 2016 DKA: never Severe hypoglycemia: never Pancreatitis: never Other: he started V-GO-20 pump in early 2016.   Interval history: he does not take any mealtime boluses.  no cbg record, but states cbg's are in the  100's.  It is in general higher as the day goes on, but not necessarily so. He has hypoglycemia approx twice a week.  This happens in the fasting state.  these episodes are mild.   Past Medical History  Diagnosis Date  . Diabetes mellitus   . Hypertension   . Hyperlipidemia   . ADHD (attention deficit hyperactivity disorder)     Past Surgical History  Procedure Laterality Date  . Neck surgery    . Spine surgery      Cervical spine x 2; Hardin Negus; [facility].    Social History   Social History  . Marital Status: Single    Spouse Name: N/A  . Number of Children: N/A  . Years of Education: N/A   Occupational History  . Press photographer    Social History Main Topics  . Smoking status: Never Smoker   . Smokeless tobacco: Not on file  . Alcohol Use: No  . Drug Use: No  . Sexual Activity: Not on file   Other Topics Concern  . Not on file   Social History Narrative    Current Outpatient Prescriptions on File Prior to Visit  Medication Sig Dispense Refill  . aspirin EC 81 MG tablet Take 1 tablet (81 mg total) by mouth daily. 90 tablet 11  . Blood Glucose Monitoring Suppl (ACCU-CHEK COMPACT CARE KIT) KIT Check glucose twice daily 1 each 1  . insulin aspart (NOVOLOG) 100 UNIT/ML injection Inject a total of 30 units under the skin daily for use in pump. 20 mL 11  . Insulin Disposable Pump (V-GO 20) KIT USE TO GIVE INSULIN ONCE A DAY 30 kit 3  . lisdexamfetamine (VYVANSE) 70 MG capsule Take 1 capsule (70 mg  total) by mouth daily. For 30d after signed 30 capsule 0  . lisinopril (PRINIVIL,ZESTRIL) 10 MG tablet Take 1 tablet (10 mg total) by mouth daily. 90 tablet 1  . meloxicam (MOBIC) 7.5 MG tablet Take 1 tablet (7.5 mg total) by mouth daily. PATIENT NEEDS OFFICE VISIT FOR ADDITIONAL REFILLS 30 tablet 0  . metFORMIN (GLUCOPHAGE) 1000 MG tablet Take 1 tablet (1,000 mg total) by mouth 2 (two) times daily with a meal. 180 tablet 1  . simvastatin (ZOCOR) 40 MG tablet TAKE 1 TABLET (40 MG) BY MOUTH DAILY  6 PM   "OFFICE VISIT NEEDED FOR REFILLS" 90 tablet 0  . sitaGLIPtin (JANUVIA) 100 MG tablet Take 1 tablet (100 mg total) by mouth daily. 90 tablet 1   No current facility-administered medications on file prior to visit.    Allergies  Allergen Reactions  . Flexeril [Cyclobenzaprine Hcl]   . Lidocaine Swelling    throat  . Novocain [Procaine Hcl] Swelling    Family History  Problem Relation Age of Onset  . Heart disease Mother     CHF  . Diabetes Mother   . Heart disease Father   . Cancer Brother     BP 126/84 mmHg  Pulse 105  Temp(Src) 97.7 F (36.5 C) (Oral)  Ht 5' 8.5" (1.74 m)  Wt 182 lb (82.555 kg)  BMI 27.27 kg/m2  SpO2 95%  Review of Systems Denies LOC.      Objective:   Physical Exam VITAL SIGNS:  See vs page GENERAL: no distress Pulses: dorsalis pedis intact bilat.   MSK: no deformity of the feet CV: no leg edema Skin:  no ulcer on the feet.  normal color and temp on the feet. Neuro: sensation is intact to touch on the feet Ext: There is bilateral onychomycosis of the toenails.    A1c=9.1%    Assessment & Plan:  DM: worse, prob due to discontinuation of glipizide.  Patient is advised the following: Patient Instructions  For your V-GO, please start taking a "click" (which is 2 units), for any big meal. check your blood sugar twice a day.  vary the time of day when you check, between before the 3 meals, and at bedtime.  also check if you have symptoms of your  blood sugar being too high or too low.  please keep a record of the readings and bring it to your next appointment here.  You can write it on any piece of paper.  please call us sooner if your blood sugar goes below 70, or if you have a lot of readings over 200.  Please come back for a follow-up appointment in 2 months.

## 2015-06-02 ENCOUNTER — Emergency Department (HOSPITAL_COMMUNITY)
Admission: EM | Admit: 2015-06-02 | Discharge: 2015-06-02 | Disposition: A | Discharge status: Home / Self Care | Payer: BC Managed Care – PPO | Attending: Emergency Medicine | Admitting: Emergency Medicine

## 2015-06-02 ENCOUNTER — Emergency Department (HOSPITAL_COMMUNITY): Payer: BC Managed Care – PPO

## 2015-06-02 ENCOUNTER — Ambulatory Visit (INDEPENDENT_AMBULATORY_CARE_PROVIDER_SITE_OTHER): Payer: BC Managed Care – PPO

## 2015-06-02 ENCOUNTER — Encounter (HOSPITAL_COMMUNITY): Payer: Self-pay | Admitting: Emergency Medicine

## 2015-06-02 ENCOUNTER — Ambulatory Visit (INDEPENDENT_AMBULATORY_CARE_PROVIDER_SITE_OTHER): Payer: BC Managed Care – PPO | Admitting: Physician Assistant

## 2015-06-02 VITALS — BP 114/74 | HR 86 | Temp 99.4°F | Resp 16 | Ht 68.5 in | Wt 180.0 lb

## 2015-06-02 DIAGNOSIS — Y9289 Other specified places as the place of occurrence of the external cause: Secondary | ICD-10-CM | POA: Diagnosis not present

## 2015-06-02 DIAGNOSIS — M542 Cervicalgia: Secondary | ICD-10-CM

## 2015-06-02 DIAGNOSIS — Y9389 Activity, other specified: Secondary | ICD-10-CM | POA: Insufficient documentation

## 2015-06-02 DIAGNOSIS — I10 Essential (primary) hypertension: Secondary | ICD-10-CM | POA: Diagnosis not present

## 2015-06-02 DIAGNOSIS — Y998 Other external cause status: Secondary | ICD-10-CM | POA: Insufficient documentation

## 2015-06-02 DIAGNOSIS — F909 Attention-deficit hyperactivity disorder, unspecified type: Secondary | ICD-10-CM | POA: Diagnosis not present

## 2015-06-02 DIAGNOSIS — R51 Headache: Secondary | ICD-10-CM

## 2015-06-02 DIAGNOSIS — W19XXXA Unspecified fall, initial encounter: Secondary | ICD-10-CM

## 2015-06-02 DIAGNOSIS — W06XXXA Fall from bed, initial encounter: Secondary | ICD-10-CM | POA: Diagnosis not present

## 2015-06-02 DIAGNOSIS — Z79899 Other long term (current) drug therapy: Secondary | ICD-10-CM | POA: Insufficient documentation

## 2015-06-02 DIAGNOSIS — S199XXA Unspecified injury of neck, initial encounter: Secondary | ICD-10-CM | POA: Diagnosis present

## 2015-06-02 DIAGNOSIS — Z7982 Long term (current) use of aspirin: Secondary | ICD-10-CM | POA: Insufficient documentation

## 2015-06-02 DIAGNOSIS — Z7984 Long term (current) use of oral hypoglycemic drugs: Secondary | ICD-10-CM | POA: Diagnosis not present

## 2015-06-02 DIAGNOSIS — E119 Type 2 diabetes mellitus without complications: Secondary | ICD-10-CM | POA: Insufficient documentation

## 2015-06-02 DIAGNOSIS — E785 Hyperlipidemia, unspecified: Secondary | ICD-10-CM | POA: Insufficient documentation

## 2015-06-02 DIAGNOSIS — S0990XA Unspecified injury of head, initial encounter: Secondary | ICD-10-CM | POA: Diagnosis not present

## 2015-06-02 DIAGNOSIS — R519 Headache, unspecified: Secondary | ICD-10-CM

## 2015-06-02 DIAGNOSIS — Z791 Long term (current) use of non-steroidal anti-inflammatories (NSAID): Secondary | ICD-10-CM | POA: Diagnosis not present

## 2015-06-02 DIAGNOSIS — Z794 Long term (current) use of insulin: Secondary | ICD-10-CM | POA: Insufficient documentation

## 2015-06-02 NOTE — ED Notes (Signed)
Patient transported to CT 

## 2015-06-02 NOTE — Progress Notes (Signed)
06/02/2015 6:43 PM   DOB: 20-Dec-1950 / MRN: [id number]  SUBJECTIVE:  Jeff [name] is a 64 y.o. male presenting for neck pain that started after falling out of bed 3 weeks ago.  He complains of neck pain, some loss in ROM and HA.  Denies paresthesia and weakness.  Reports that he does have pain with raising his arm. His wife reports that he has "a screw loose" in a neck plate that was placed in 1978.     He is allergic to flexeril; lidocaine; and novocain.   He  has a past medical history of Diabetes mellitus; Hypertension; Hyperlipidemia; and ADHD (attention deficit hyperactivity disorder).    He  reports that he has never smoked. He does not have any smokeless tobacco history on file. He reports that he does not drink alcohol or use illicit drugs. He  has no sexual activity history on file. The patient  has past surgical history that includes Neck surgery and Spine surgery.  His family history includes Cancer in his brother; Diabetes in his mother; Heart disease in his father and mother.  Review of Systems  Constitutional: Negative for fever.  Musculoskeletal: Positive for myalgias, falls and neck pain. Negative for back pain.  Skin: Negative for rash.  Neurological: Negative for headaches.    Problem list and medications reviewed and updated by myself where necessary, and exist elsewhere in the encounter.   OBJECTIVE:  BP 114/74 mmHg  Pulse 86  Temp(Src) 99.4 F (37.4 C) (Oral)  Resp 16  Ht 5' 8.5" (1.74 m)  Wt 180 lb (81.647 kg)  BMI 26.97 kg/m2  SpO2 98%  Physical Exam  Constitutional: He is oriented to person, place, and time. He appears well-developed. He does not appear ill.  Eyes: Conjunctivae and EOM are normal. Pupils are equal, round, and reactive to light.  Neck:  Neck exam deferred pending radiograph.   Cardiovascular: Normal rate.   Pulmonary/Chest: Effort normal.  Abdominal: He exhibits no distension.  Musculoskeletal: Normal range of motion.  Neurological:  He is alert and oriented to person, place, and time. He has normal strength and normal reflexes. He is not disoriented. No cranial nerve deficit or sensory deficit. He exhibits normal muscle tone. He displays a negative Romberg sign. He displays no seizure activity. Coordination and gait normal. GCS eye subscore is 4. GCS verbal subscore is 5. GCS motor subscore is 6.  Skin: Skin is warm and dry. He is not diaphoretic.  Psychiatric: He has a normal mood and affect.  Nursing note and vitals reviewed.   No results found for this or any previous visit (from the past 48 hour(s)).  [facility] reading (PRIMARY) by  PA Clark: STAT read please comment.   CLINICAL DATA: Fall from bed with neck pain  EXAM: CERVICAL SPINE - COMPLETE 4+ VIEW  COMPARISON: None.  FINDINGS: On the lateral view the cervical spine is visualized to the level of C6-7. There is improved visualization of the C7-T1 level on the swimmer's view. There is a normal cervical lordosis. Pre-vertebral soft tissues are within normal limits. There is a questionable oblique lucency on the odontoid view within the right C1 arch, cannot exclude a right C1 arch fracture. No additional potential cervical spine fracture. Dens is well positioned between the lateral masses of C1. The patient is status post anterior cervical discectomy and fusion from C5-C7. There is evidence of loosening of the bilateral interlocking screws at C7, with the right-sided C7 interlocking screw anteriorly displaced 7  mm in the left-sided interlocking C7 screw anteriorly displaced 3 mm. No cervical spine subluxation. No significant facet arthropathy. No appreciable foraminal stenosis. No aggressive-appearing focal osseous lesions.  IMPRESSION: 1. Questionable right C1 arch lucency, cannot exclude a right C1 arch fracture. 2. Status post ACDF C5-C7, with evidence of hardware loosening involving the bilateral C7 interlocking screws. 3. Recommend further  evaluation of these findings with cervical spine CT.  These results were called by telephone at the time of interpretation on 06/02/2015 at 7:13 pm to [facility] , who verbally acknowledged these results.  ASSESSMENT AND PLAN  Jeff [name] was seen today for neck injury and medication refill.  Diagnoses and all orders for this visit:  Neck pain: The radiologist called and was concerned about a C1 lucency that could represent a fracture.  We have provided a cervical collar and called [facility] and advised Tiffany RN that the patient will be coming by personal vehicle for further evaluation.  His wife will drive him.   -     DG Cervical Spine Complete; Future  Fall, initial encounter: Managed with the above.     The patient was advised to call or return to clinic if he does not see an improvement in symptoms or to seek the care of the closest emergency department if he worsens with the above plan.   [name], MHS, PA-C [facility] 06/02/2015 6:43 PM

## 2015-06-02 NOTE — ED Notes (Signed)
Per patient, he was at [facility] today for a fall that occurred 3 weeks ago.  Patient states he didn't hit his head or LOC.  [facility] performed an x-ray today and requested patient come to ER for CT/MRI for neck.  Radiologist wishing for further evaluation.

## 2015-06-02 NOTE — ED Provider Notes (Signed)
CSN: [id number]     Arrival date & time 06/02/15  1941 History   First MD Initiated Contact with Patient 06/02/15 2001     Chief Complaint  Patient presents with  . Fall     (Consider location/radiation/quality/duration/timing/severity/associated sxs/prior Treatment) HPI  64 year old male who presents with neck and headache after fall. History of ACDF C5-7. ne week ago was returning to bed from bathroom in the dark. Accidentally slipped out of bed while trying to get into bed and hit his head on the vanity at bedside. No LOC, but states that since his fall he has had headaches and neck at the base of his head. Was seen by PCP today with XR neck showing questionable right C1 arch lucency, recommending CT imaging for further elucidation. Sent to ED for further evaluation subsequently. Denies numbness or weakness, vision or speech changes, confusion, nausea or vomiting. Has otherwise been in usual states of health.   Past Medical History  Diagnosis Date  . Diabetes mellitus   . Hypertension   . Hyperlipidemia   . ADHD (attention deficit hyperactivity disorder)    Past Surgical History  Procedure Laterality Date  . Neck surgery    . Spine surgery      Cervical spine x 2; Hardin Negus; Critzer.   Family History  Problem Relation Age of Onset  . Heart disease Mother     CHF  . Diabetes Mother   . Heart disease Father   . Cancer Brother    Social History  Substance Use Topics  . Smoking status: Never Smoker   . Smokeless tobacco: None  . Alcohol Use: No    Review of Systems 10/14 systems reviewed and are negative other than those stated in the HPI   Allergies  Flexeril; Lidocaine; and Novocain  Home Medications   Prior to Admission medications   Medication Sig Start Date End Date Taking? Authorizing Provider  aspirin EC 81 MG tablet Take 1 tablet (81 mg total) by mouth daily. 10/02/14  Yes Shawnee Knapp, MD  insulin aspart (NOVOLOG) 100 UNIT/ML injection Inject a total of 30  units under the skin daily for use in pump. 01/17/15  Yes Renato Shin, MD  Insulin Disposable Pump (V-GO 20) KIT USE TO GIVE INSULIN ONCE A DAY 03/18/15  Yes Renato Shin, MD  lisdexamfetamine (VYVANSE) 70 MG capsule Take 1 capsule (70 mg total) by mouth daily. For 30d after signed 02/22/15  Yes Thao P Le, DO  lisinopril (PRINIVIL,ZESTRIL) 10 MG tablet Take 1 tablet (10 mg total) by mouth daily. 02/22/15  Yes Thao P Le, DO  meloxicam (MOBIC) 7.5 MG tablet Take 1 tablet (7.5 mg total) by mouth daily. PATIENT NEEDS OFFICE VISIT FOR ADDITIONAL REFILLS 05/16/15  Yes Leandrew Koyanagi, MD  metFORMIN (GLUCOPHAGE) 1000 MG tablet Take 1 tablet (1,000 mg total) by mouth 2 (two) times daily with a meal. 10/02/14  Yes Shawnee Knapp, MD  simvastatin (ZOCOR) 40 MG tablet TAKE 1 TABLET (40 MG) BY MOUTH DAILY  6 PM   "OFFICE VISIT NEEDED FOR REFILLS" 04/19/15  Yes Shawnee Knapp, MD  sitaGLIPtin (JANUVIA) 100 MG tablet Take 1 tablet (100 mg total) by mouth daily. 10/02/14  Yes Shawnee Knapp, MD  Blood Glucose Monitoring Suppl (ACCU-CHEK COMPACT CARE KIT) KIT Check glucose twice daily 12/17/12   Leandrew Koyanagi, MD  glucose blood (ACCU-CHEK AVIVA) test strip 1 each by Other route 2 (two) times daily. And lancets 2/day 05/20/15  Renato Shin, MD   BP 139/97 mmHg  Pulse 87  Temp(Src) 97.7 F (36.5 C) (Oral)  Resp 16  Ht 5' 8" (1.727 m)  Wt 180 lb (81.647 kg)  BMI 27.38 kg/m2  SpO2 100% Physical Exam Physical Exam  Nursing note and vitals reviewed. Constitutional: Well developed, well nourished, non-toxic, and in no acute distress Head: Normocephalic and atraumatic.  Mouth/Throat: Oropharynx is clear and moist.  Neck: In cervical collar, no midline cervical spine tenderness, left paraspinal tenderness around C1-2 Cardiovascular: Normal rate and regular rhythm.   Pulmonary/Chest: Effort normal and breath sounds normal.  Abdominal: Soft. There is no tenderness. There is no rebound and no guarding.  Musculoskeletal:  Normal range of motion.  Neurological: Alert, no facial droop, fluent speech, sensation to light touch in tact throughout face and upper/lower bilateral extremities, no pronator drift, no drift of LE against gravity, full strength hand drip and ankle dorsi/plantarflexion bilaterally. +2 patellar, biceps, and brachioradialis reflexes Skin: Skin is warm and dry.  Psychiatric: Cooperative  ED Course  Procedures (including critical care time) Labs Review Labs Reviewed - No data to display  Imaging Review Dg Cervical Spine Complete  06/02/2015  CLINICAL DATA:  Fall from bed with neck pain EXAM: CERVICAL SPINE - COMPLETE 4+ VIEW COMPARISON:  None. FINDINGS: On the lateral view the cervical spine is visualized to the level of C6-7. There is improved visualization of the C7-T1 level on the swimmer's view. There is a normal cervical lordosis. Pre-vertebral soft tissues are within normal limits. There is a questionable oblique lucency on the odontoid view within the right C1 arch, cannot exclude a right C1 arch fracture. No additional potential cervical spine fracture. Dens is well positioned between the lateral masses of C1. The patient is status post anterior cervical discectomy and fusion from C5-C7. There is evidence of loosening of the bilateral interlocking screws at C7, with the right-sided C7 interlocking screw anteriorly displaced 7 mm in the left-sided interlocking C7 screw anteriorly displaced 3 mm. No cervical spine subluxation. No significant facet arthropathy. No appreciable foraminal stenosis. No aggressive-appearing focal osseous lesions. IMPRESSION: 1. Questionable right C1 arch lucency, cannot exclude a right C1 arch fracture. 2. Status post ACDF C5-C7, with evidence of hardware loosening involving the bilateral C7 interlocking screws. 3. Recommend further evaluation of these findings with cervical spine CT. These results were called by telephone at the time of interpretation on 06/02/2015 at  7:13 pm to [place] , who verbally acknowledged these results. Electronically Signed   By: Ilona Sorrel M.D.   On: 06/02/2015 19:16   Ct Head Wo Contrast  06/02/2015  CLINICAL DATA:  Slipped and fell on bedroom three weeks ago injuring back of head. Continued headaches and posterior neck pain. EXAM: CT HEAD WITHOUT CONTRAST CT CERVICAL SPINE WITHOUT CONTRAST TECHNIQUE: Multidetector CT imaging of the head and cervical spine was performed following the standard protocol without intravenous contrast. Multiplanar CT image reconstructions of the cervical spine were also generated. COMPARISON:  Cervical spine CT 01/03/2010 FINDINGS: CT HEAD FINDINGS Ventricles, cisterns and other CSF spaces are within normal. There is mild chronic ischemic microvascular disease. There is no mass, mass effect, shift of midline structures or acute hemorrhage. There is no evidence of acute infarction. Remaining bones and soft tissues are within normal. CT CERVICAL SPINE FINDINGS Vertebral body alignment and heights are normal. There is minimal spondylosis of the cervical spine. Anterior fusion hardware is present from C5-C7. Note that the right-sided C7 screw projects  7 mm anterior to the fusion plate as this loosening is unchanged from the previous exam. There is mild uncovertebral joint spurring. Prevertebral soft tissues as well as the atlantoaxial articulation are within normal. There is no acute fracture or subluxation. Neural foraminal narrowing at several levels without significant change. Remainder of the exam is within normal. IMPRESSION: No acute intracranial findings. Mild chronic ischemic microvascular disease. No acute cervical spine injury. Mild spondylosis of the cervical spine with anterior fusion hardware from C5-C7. Stable loosening of the right right-sided C7 screw. Electronically Signed   By: Marin Olp M.D.   On: 06/02/2015 21:35   Ct Cervical Spine Wo Contrast  06/02/2015  CLINICAL DATA:  Slipped and  fell on bedroom three weeks ago injuring back of head. Continued headaches and posterior neck pain. EXAM: CT HEAD WITHOUT CONTRAST CT CERVICAL SPINE WITHOUT CONTRAST TECHNIQUE: Multidetector CT imaging of the head and cervical spine was performed following the standard protocol without intravenous contrast. Multiplanar CT image reconstructions of the cervical spine were also generated. COMPARISON:  Cervical spine CT 01/03/2010 FINDINGS: CT HEAD FINDINGS Ventricles, cisterns and other CSF spaces are within normal. There is mild chronic ischemic microvascular disease. There is no mass, mass effect, shift of midline structures or acute hemorrhage. There is no evidence of acute infarction. Remaining bones and soft tissues are within normal. CT CERVICAL SPINE FINDINGS Vertebral body alignment and heights are normal. There is minimal spondylosis of the cervical spine. Anterior fusion hardware is present from C5-C7. Note that the right-sided C7 screw projects 7 mm anterior to the fusion plate as this loosening is unchanged from the previous exam. There is mild uncovertebral joint spurring. Prevertebral soft tissues as well as the atlantoaxial articulation are within normal. There is no acute fracture or subluxation. Neural foraminal narrowing at several levels without significant change. Remainder of the exam is within normal. IMPRESSION: No acute intracranial findings. Mild chronic ischemic microvascular disease. No acute cervical spine injury. Mild spondylosis of the cervical spine with anterior fusion hardware from C5-C7. Stable loosening of the right right-sided C7 screw. Electronically Signed   By: Marin Olp M.D.   On: 06/02/2015 21:35   I have personally reviewed and evaluated these images and lab results as part of my medical decision-making.   EKG Interpretation None      MDM   Final diagnoses:  Fall, initial encounter  Acute nonintractable headache, unspecified headache type  Neck pain     64 year old male who presents with neck pain and headache after fall 1 week ago. Is neurologically intact on presentation. Arrives in a cervical collar from his doctor's office. X-rays from his doctor's office are reviewed, with questionable right C1 arch lucency. No midline cervical spine tenderness but does report left paraspinal tenderness at around C1-C2. Refuses pain medications in ED. Due to headaches after his fall CT head was performed as well with his CT cervical spine. Imaging studies visualized and reviewed with radiology. No evidence of acute intracranial processes. He has stable post surgical changes involving his cervical spine, but no evidence of acute cervical spine injury. No concern for spinal cord injury. No need for emergent MRI. Strict return and follow-up instructions reviewed. He expressed understanding of all discharge instructions and felt comfortable with the plan of care.     Forde Dandy, MD 06/03/15 [phone]

## 2015-06-02 NOTE — Discharge Instructions (Signed)
Please follow-up with your primary care provider to make sure symptoms improve.  Take tylenol and motrin as needed for pain control  If symptoms worsening or persistent, discuss outpatient MRI with your primary care provider  While having persistent pain in your neck, wear your soft cervical collar from home for support  Return for worsening symptoms, including numbness of weakness in the arms or legs, confusion, worsening pain, or any other symptoms concerning to you.

## 2015-06-18 ENCOUNTER — Ambulatory Visit (INDEPENDENT_AMBULATORY_CARE_PROVIDER_SITE_OTHER): Payer: BC Managed Care – PPO | Admitting: Physician Assistant

## 2015-06-18 VITALS — BP 112/62 | HR 70 | Temp 97.5°F | Resp 14 | Ht 69.0 in | Wt 181.0 lb

## 2015-06-18 DIAGNOSIS — E1121 Type 2 diabetes mellitus with diabetic nephropathy: Secondary | ICD-10-CM | POA: Diagnosis not present

## 2015-06-18 DIAGNOSIS — Z794 Long term (current) use of insulin: Secondary | ICD-10-CM | POA: Diagnosis not present

## 2015-06-18 DIAGNOSIS — M542 Cervicalgia: Secondary | ICD-10-CM

## 2015-06-18 DIAGNOSIS — Z760 Encounter for issue of repeat prescription: Secondary | ICD-10-CM

## 2015-06-18 LAB — POCT GLYCOSYLATED HEMOGLOBIN (HGB A1C): HEMOGLOBIN A1C: 10.8

## 2015-06-18 LAB — GLUCOSE, POCT (MANUAL RESULT ENTRY): POC GLUCOSE: 187 mg/dL — AB (ref 70–99)

## 2015-06-18 LAB — BASIC METABOLIC PANEL
BUN: 15 mg/dL (ref 7–25)
CHLORIDE: 102 mmol/L (ref 98–110)
CO2: 22 mmol/L (ref 20–31)
CREATININE: 1.34 mg/dL — AB (ref 0.70–1.25)
Calcium: 9.5 mg/dL (ref 8.6–10.3)
Glucose, Bld: 199 mg/dL — ABNORMAL HIGH (ref 65–99)
POTASSIUM: 4.3 mmol/L (ref 3.5–5.3)
SODIUM: 132 mmol/L — AB (ref 135–146)

## 2015-06-18 MED ORDER — LISDEXAMFETAMINE DIMESYLATE 70 MG PO CAPS: 70.0000 mg | ORAL_CAPSULE | Freq: Every day | ORAL | Status: DC

## 2015-06-18 MED ORDER — TRAMADOL-ACETAMINOPHEN 37.5-325 MG PO TABS: 1.0000 | ORAL_TABLET | Freq: Four times a day (QID) | ORAL | Status: DC | PRN

## 2015-06-18 NOTE — Patient Instructions (Signed)

## 2015-06-18 NOTE — Progress Notes (Signed)
06/18/2015 9:29 AM   DOB: 1950-06-15 / MRN: [id number]  SUBJECTIVE:  Jeff [name] is a 65 y.o. male presenting for neck pain.  He was seen roughly 2 weeks ago for same and radiographs here revealed possible C1 neck fracture.  He was subsequently placed in a cervical collar and sent to the ED.  CT scan was negative for fracture. Today he complains that his neck "is killing him."  He complains of bilateral shoulder pain that he describes as a dull ache.  He has a history of diabetes and well controlled HTN and his creatinine has been increasing over the last few years.   No bowel or bladder incontinence.  Negative for constant paresthesia in the upper extremities   He would like a refill of his Vivance.  He was first placed on this by Benny Lennert PA-C in 2013.  Reports this helps him concentrate and without he has difficulty staying on task and is increasingly forgetful.  Denies any palpitations and insomnia with using this.    His last A1c was 9.1.  He is taking Metformin and Januvia daily for control.   He is allergic to flexeril; lidocaine; and novocain.   He  has a past medical history of Diabetes mellitus; Hypertension; Hyperlipidemia; and ADHD (attention deficit hyperactivity disorder).    He  reports that he has never smoked. He does not have any smokeless tobacco history on file. He reports that he does not drink alcohol or use illicit drugs. He  has no sexual activity history on file. The patient  has past surgical history that includes Neck surgery and Spine surgery.  His family history includes Cancer in his brother; Diabetes in his mother; Heart disease in his father and mother.  Review of Systems  Constitutional: Negative for fever and chills.  Eyes: Negative for blurred vision.  Respiratory: Negative for cough and shortness of breath.   Cardiovascular: Negative for chest pain.  Gastrointestinal: Negative for nausea and abdominal pain.  Genitourinary: Negative for dysuria, urgency  and frequency.  Musculoskeletal: Positive for myalgias and neck pain. Negative for back pain, joint pain and falls.  Skin: Negative for rash.  Neurological: Negative for dizziness, tingling and headaches.  Psychiatric/Behavioral: Negative for depression. The patient is not nervous/anxious.     Problem list and medications reviewed and updated by myself where necessary, and exist elsewhere in the encounter.   OBJECTIVE:  BP 112/62 mmHg  Pulse 70  Temp(Src) 97.5 F (36.4 C) (Oral)  Resp 14  Ht 5\' 9"  (1.753 m)  Wt 181 lb (82.101 kg)  BMI 26.72 kg/m2  SpO2 98%  Physical Exam  Constitutional: He is oriented to person, place, and time. He appears well-developed. He does not appear ill.  Eyes: Conjunctivae and EOM are normal. Pupils are equal, round, and reactive to light.  Cardiovascular: Normal rate.   Pulmonary/Chest: Effort normal.  Abdominal: He exhibits no distension.  Musculoskeletal: Normal range of motion.  Neurological: He is alert and oriented to person, place, and time. No cranial nerve deficit. Coordination normal.  Skin: Skin is warm and dry. He is not diaphoretic.  Psychiatric: He has a normal mood and affect.  Nursing note and vitals reviewed.  Lab Results  Component Value Date   CREATININE 1.51* 10/02/2014   CREATININE 1.13 02/27/2014   CREATININE 1.31 11/22/2013     No results found for this or any previous visit (from the past 48 hour(s)).  ASSESSMENT AND PLAN  Jonovan was seen today for  follow-up and medication refill.  Diagnoses and all orders for this visit:  Type 2 diabetes mellitus with diabetic nephropathy, with long-term current use of insulin [facility](HCC): He is managed by endo.  Diet discussed.  He is not drinking any calories and avoids sweets but he eats a lot of pasta daily.  Advised that he start counting carbohydrates.   -     POCT glycosylated hemoglobin (Hb A1C) -     POCT glucose (manual entry) -     Basic metabolic panel  Neck pain: His  diabetes and poor kidney function is limiting my options for pain and inflammation control.  I want to avoid opioids and surgery for him until absolutely necessary.  Will send to him sports medicine for other options.  He has been written to work from home for the next week.   -     AMB referral to sports medicine  Other orders -     lisdexamfetamine (VYVANSE) 70 MG capsule; Take 1 capsule (70 mg total) by mouth daily. Do not fill until 08/16/15. -     lisdexamfetamine (VYVANSE) 70 MG capsule; Take 1 capsule (70 mg total) by mouth daily. Do not fill until 07/19/15 -     lisdexamfetamine (VYVANSE) 70 MG capsule; Take 1 capsule (70 mg total) by mouth daily.   The patient was advised to call or return to clinic if he does not see an improvement in symptoms or to seek the care of the closest emergency department if he worsens with the above plan.   [name], MHS, PA-C [facility] 06/18/2015 9:29 AM

## 2015-06-19 ENCOUNTER — Other Ambulatory Visit: Payer: Self-pay | Admitting: Internal Medicine

## 2015-06-21 ENCOUNTER — Ambulatory Visit (INDEPENDENT_AMBULATORY_CARE_PROVIDER_SITE_OTHER): Payer: BC Managed Care – PPO | Admitting: Family Medicine

## 2015-06-21 ENCOUNTER — Encounter: Payer: Self-pay | Admitting: Family Medicine

## 2015-06-21 VITALS — BP 124/83 | HR 55 | Ht 68.0 in | Wt 180.0 lb

## 2015-06-21 DIAGNOSIS — S199XXA Unspecified injury of neck, initial encounter: Secondary | ICD-10-CM

## 2015-06-21 MED ORDER — PREDNISONE 10 MG PO TABS: ORAL_TABLET | ORAL | Status: DC

## 2015-06-21 NOTE — Patient Instructions (Signed)
You have a cervical spine contusion with concern for possible cervical radiculopathy (a pinched nerve in the neck) as well. Prednisone 6 day dose pack to relieve irritation/inflammation of the nerves Ultracet as needed for severe pain (no driving on this medicine) (this has tylenol in it). Consider capsaicin topically up to 4 times a day. Consider cervical collar if severely painful. Simple range of motion exercises within limits of pain to prevent further stiffness. Start physical therapy for stretching, exercises, traction, and modalities. Heat 15 minutes at a time 3-4 times a day to help with spasms. Watch head position when on computers, texting, when sleeping in bed - should in line with back to prevent further nerve traction and irritation. Consider home traction unit if you get benefit with this in physical therapy. If not improving we will consider an MRI. Follow up with me in 4 weeks for reevaluation.

## 2015-06-21 NOTE — Telephone Encounter (Signed)
Patient has an appointment with sports medicine very soon.  He has some kidney disease and I have advised that NSAIDS may exacerbate this.  Lets wait until he sees sports medicine to see if they have other options before refilling Meloxicam.

## 2015-06-21 NOTE — Telephone Encounter (Signed)
Do you want to refill mobic?

## 2015-06-22 DIAGNOSIS — S199XXA Unspecified injury of neck, initial encounter: Secondary | ICD-10-CM | POA: Insufficient documentation

## 2015-06-22 NOTE — Assessment & Plan Note (Signed)
unfortunately I do not have a baseline neurologic exam to know what of his exam is attributed to sequelae from prior neck problems and fusion.  I suspect the weakness and decreased brachioradialis reflex are old but am not certain.  His radiographs and CT are reassuring this is not an acute fracture or subluxation.  We discussed it's possible he has a new disc herniation of the cervical spine.  We also discussed treatment options are somewhat limited given his increased creatinine.  He will start physical therapy for cervical strain, contusion, possible disc herniation.  We discussed risks/benefits of prednisone including increased blood sugars for next 8 days - we will go ahead with this.  Avoid other NSAIDs however.  To fill the ultracet provided by PCP.  Consider capsaicin topically, heat for spasms.  F/u in 4 weeks.  Would consider MRI if he's not improving as expected.

## 2015-06-22 NOTE — Progress Notes (Signed)
PCP: Leandrew Koyanagi, MD Consultation requested by Philis Fendt PA-C  Subjective:   HPI: Patient is a 65 y.o. male here for neck pain.  Patient has prior history of cervical spine fusion C5-7 with known loosening of C7 screw. Reports no issues recently until he was getting up to use the restroom a few weeks ago. He went to sit on edge of bed and fell off striking stool next to a dressing table with base of his skull, cervical spine. Immediate pain, continued throbbing posterior skull with some pain into both shoulders. Pain is achy with some sharp components to it. Pain level 5/10. No radiation into extremities though. No numbness/tingling. No bowel/bladder dysfunction. Advised to avoid aleve, ibuprofen due to mildly elevated creatinine. Had cervical spine radiographs 12/29 with possible C1 fracture - follow up CT that day without evidence acute fracture and CT head was normal.  Past Medical History  Diagnosis Date  . Diabetes mellitus   . Hypertension   . Hyperlipidemia   . ADHD (attention deficit hyperactivity disorder)     Current Outpatient Prescriptions on File Prior to Visit  Medication Sig Dispense Refill  . aspirin EC 81 MG tablet Take 1 tablet (81 mg total) by mouth daily. 90 tablet 11  . Blood Glucose Monitoring Suppl (ACCU-CHEK COMPACT CARE KIT) KIT Check glucose twice daily 1 each 1  . glucose blood (ACCU-CHEK AVIVA) test strip 1 each by Other route 2 (two) times daily. And lancets 2/day 100 each 12  . insulin aspart (NOVOLOG) 100 UNIT/ML injection Inject a total of 30 units under the skin daily for use in pump. 20 mL 11  . Insulin Disposable Pump (V-GO 20) KIT USE TO GIVE INSULIN ONCE A DAY 30 kit 3  . lisdexamfetamine (VYVANSE) 70 MG capsule Take 1 capsule (70 mg total) by mouth daily. Do not fill until 08/16/15. 30 capsule 0  . lisdexamfetamine (VYVANSE) 70 MG capsule Take 1 capsule (70 mg total) by mouth daily. Do not fill until 07/19/15 30 capsule 0  .  lisdexamfetamine (VYVANSE) 70 MG capsule Take 1 capsule (70 mg total) by mouth daily. 30 capsule 0  . lisinopril (PRINIVIL,ZESTRIL) 10 MG tablet Take 1 tablet (10 mg total) by mouth daily. 90 tablet 1  . meloxicam (MOBIC) 7.5 MG tablet Take 1 tablet (7.5 mg total) by mouth daily. PATIENT NEEDS OFFICE VISIT FOR ADDITIONAL REFILLS 30 tablet 0  . metFORMIN (GLUCOPHAGE) 1000 MG tablet Take 1 tablet (1,000 mg total) by mouth 2 (two) times daily with a meal. 180 tablet 1  . simvastatin (ZOCOR) 40 MG tablet TAKE 1 TABLET (40 MG) BY MOUTH DAILY  6 PM   "OFFICE VISIT NEEDED FOR REFILLS" 90 tablet 0  . sitaGLIPtin (JANUVIA) 100 MG tablet Take 1 tablet (100 mg total) by mouth daily. 90 tablet 1  . traMADol-acetaminophen (ULTRACET) 37.5-325 MG tablet Take 1 tablet by mouth every 6 (six) hours as needed. 30 tablet 0   No current facility-administered medications on file prior to visit.    Past Surgical History  Procedure Laterality Date  . Neck surgery    . Spine surgery      Cervical spine x 2; Hardin Negus; Critzer.    Allergies  Allergen Reactions  . Flexeril [Cyclobenzaprine Hcl] Other (See Comments)    Causes him to pass out and lowers his BP  . Lidocaine Swelling    throat  . Novocain [Procaine Hcl] Swelling    Social History   Social History  . Marital  Status: Single    Spouse Name: N/A  . Number of Children: N/A  . Years of Education: N/A   Occupational History  . Press photographer    Social History Main Topics  . Smoking status: Never Smoker   . Smokeless tobacco: Not on file  . Alcohol Use: No  . Drug Use: No  . Sexual Activity: Not on file   Other Topics Concern  . Not on file   Social History Narrative    Family History  Problem Relation Age of Onset  . Heart disease Mother     CHF  . Diabetes Mother   . Heart disease Father   . Cancer Brother     BP 124/83 mmHg  Pulse 55  Ht '5\' 8"'  (1.727 m)  Wt 180 lb (81.647 kg)  BMI 27.38 kg/m2  Review of  Systems: See HPI above.    Objective:  Physical Exam:  Gen: NAD, comfortable in exam room.  Neck: No gross deformity, swelling, bruising. TTP R > L paraspinal cervical regions.  No bony tenderness. ROM 5 degrees extension, 25 degrees bilateral rotations, full flexion. LUE strength 5/5.  RUE strength 4/5 with thumb opposition, wrist extension, elbow flexion and extension. Sensation intact to light touch.   2+ reflexes in triceps, biceps, brachioradialis tendons on left.  2+ right biceps, triceps but trace brachioradialis on right. Negative spurlings.    Assessment & Plan:  1. Neck injury - unfortunately I do not have a baseline neurologic exam to know what of his exam is attributed to sequelae from prior neck problems and fusion.  I suspect the weakness and decreased brachioradialis reflex are old but am not certain.  His radiographs and CT are reassuring this is not an acute fracture or subluxation.  We discussed it's possible he has a new disc herniation of the cervical spine.  We also discussed treatment options are somewhat limited given his increased creatinine.  He will start physical therapy for cervical strain, contusion, possible disc herniation.  We discussed risks/benefits of prednisone including increased blood sugars for next 8 days - we will go ahead with this.  Avoid other NSAIDs however.  To fill the ultracet provided by PCP.  Consider capsaicin topically, heat for spasms.  F/u in 4 weeks.  Would consider MRI if he's not improving as expected.

## 2015-06-23 ENCOUNTER — Ambulatory Visit (INDEPENDENT_AMBULATORY_CARE_PROVIDER_SITE_OTHER): Payer: BC Managed Care – PPO | Admitting: Endocrinology

## 2015-06-23 ENCOUNTER — Encounter: Payer: Self-pay | Admitting: Endocrinology

## 2015-06-23 VITALS — BP 118/70 | HR 73 | Temp 97.9°F | Resp 16 | Wt 181.0 lb

## 2015-06-23 DIAGNOSIS — E0900 Drug or chemical induced diabetes mellitus with hyperosmolarity without nonketotic hyperglycemic-hyperosmolar coma (NKHHC): Secondary | ICD-10-CM | POA: Diagnosis not present

## 2015-06-23 LAB — POCT GLYCOSYLATED HEMOGLOBIN (HGB A1C): HEMOGLOBIN A1C: 11

## 2015-06-23 NOTE — Progress Notes (Signed)
Subjective:    Patient ID: Jeff [name], male    DOB: [birth year], 65 y.o.   MRN: [id number]  HPI Pt returns for f/u of diabetes mellitus: DM type: Insulin-requiring type 2 Dx'ed: 5784 Complications: painful polyneuropathy. Therapy: insulin since 2016 DKA: never Severe hypoglycemia: never Pancreatitis: never Other: he started V-GO-20 pump in early 2016.   Interval history: He has been on prednisone x 2 days, for a neck injury.  He will be on this for 3 more days.  Prior to the steroid, cbg's varies from 129-200's.  It is highest in am.  He was taking 1 click per meal.  Since on the prednisone, cbg's are in the 300's. Past Medical History  Diagnosis Date  . Diabetes mellitus   . Hypertension   . Hyperlipidemia   . ADHD (attention deficit hyperactivity disorder)     Past Surgical History  Procedure Laterality Date  . Neck surgery    . Spine surgery      Cervical spine x 2; Hardin Negus; Critzer.    Social History   Social History  . Marital Status: Single    Spouse Name: N/A  . Number of Children: N/A  . Years of Education: N/A   Occupational History  . Press photographer    Social History Main Topics  . Smoking status: Never Smoker   . Smokeless tobacco: Not on file  . Alcohol Use: No  . Drug Use: No  . Sexual Activity: Not on file   Other Topics Concern  . Not on file   Social History Narrative    Current Outpatient Prescriptions on File Prior to Visit  Medication Sig Dispense Refill  . aspirin EC 81 MG tablet Take 1 tablet (81 mg total) by mouth daily. 90 tablet 11  . Blood Glucose Monitoring Suppl (ACCU-CHEK COMPACT CARE KIT) KIT Check glucose twice daily 1 each 1  . glucose blood (ACCU-CHEK AVIVA) test strip 1 each by Other route 2 (two) times daily. And lancets 2/day 100 each 12  . insulin aspart (NOVOLOG) 100 UNIT/ML injection Inject a total of 30 units under the skin daily for use in pump. 20 mL 11  . Insulin Disposable Pump (V-GO 20) KIT USE  TO GIVE INSULIN ONCE A DAY 30 kit 3  . lisdexamfetamine (VYVANSE) 70 MG capsule Take 1 capsule (70 mg total) by mouth daily. Do not fill until 08/16/15. 30 capsule 0  . lisdexamfetamine (VYVANSE) 70 MG capsule Take 1 capsule (70 mg total) by mouth daily. Do not fill until 07/19/15 30 capsule 0  . lisdexamfetamine (VYVANSE) 70 MG capsule Take 1 capsule (70 mg total) by mouth daily. 30 capsule 0  . lisinopril (PRINIVIL,ZESTRIL) 10 MG tablet Take 1 tablet (10 mg total) by mouth daily. 90 tablet 1  . meloxicam (MOBIC) 7.5 MG tablet Take 1 tablet (7.5 mg total) by mouth daily. PATIENT NEEDS OFFICE VISIT FOR ADDITIONAL REFILLS 30 tablet 0  . metFORMIN (GLUCOPHAGE) 1000 MG tablet Take 1 tablet (1,000 mg total) by mouth 2 (two) times daily with a meal. 180 tablet 1  . predniSONE (DELTASONE) 10 MG tablet 6 tabs po day 1, 5 tabs po day 2, 4 tabs po day 3, 3 tabs po day 4, 2 tabs po day 5, 1 tab po day 6 21 tablet 0  . simvastatin (ZOCOR) 40 MG tablet TAKE 1 TABLET (40 MG) BY MOUTH DAILY  6 PM   "OFFICE VISIT NEEDED FOR REFILLS" 90 tablet 0  . sitaGLIPtin (JANUVIA)  100 MG tablet Take 1 tablet (100 mg total) by mouth daily. 90 tablet 1  . traMADol-acetaminophen (ULTRACET) 37.5-325 MG tablet Take 1 tablet by mouth every 6 (six) hours as needed. 30 tablet 0   No current facility-administered medications on file prior to visit.    Allergies  Allergen Reactions  . Flexeril [Cyclobenzaprine Hcl] Other (See Comments)    Causes him to pass out and lowers his BP  . Lidocaine Swelling    throat  . Novocain [Procaine Hcl] Swelling    Family History  Problem Relation Age of Onset  . Heart disease Mother     CHF  . Diabetes Mother   . Heart disease Father   . Cancer Brother     BP 118/70 mmHg  Pulse 73  Temp(Src) 97.9 F (36.6 C) (Oral)  Resp 16  Wt 181 lb (82.101 kg)  SpO2 96%  Review of Systems He denies hypoglycemia    Objective:   Physical Exam VITAL SIGNS:  See vs page GENERAL: no  distress Pulses: dorsalis pedis intact bilat.   MSK: no deformity of the feet CV: no leg edema Skin:  no ulcer on the feet.  normal color and temp on the feet. Neuro: sensation is intact to touch on the feet.   Ext: There is bilateral onychomycosis of the toenails   Lab Results  Component Value Date   HGBA1C 11.0 06/23/2015       Assessment & Plan:  DM: ongoing poor control: even with the prednisone, a1c is much higher than reported cbg's.    Patient is advised the following: Patient Instructions  For your V-GO, please increase to 2 "clicks" 3 times a day (with each meal).  Each click is 2 units.  While the sugar is high from the prednisone, also take 1 extra click for any blood sugar in the 200's, and 3 extra for any over 300.   The effect of the prednisone lasts until about 1 week after you are finished with it.   check your blood sugar twice a day.  vary the time of day when you check, between before the 3 meals, and at bedtime.  also check if you have symptoms of your blood sugar being too high or too low.  please keep a record of the readings and bring it to your next appointment here.  You can write it on any piece of paper.  please call us sooner if your blood sugar goes below 70, or if you have a lot of readings over 200.  Please come back for a follow-up appointment in 1 month.

## 2015-06-23 NOTE — Patient Instructions (Addendum)
For your V-GO, please increase to 2 "clicks" 3 times a day (with each meal).  Each click is 2 units.  While the sugar is high from the prednisone, also take 1 extra click for any blood sugar in the 200's, and 3 extra for any over 300.   The effect of the prednisone lasts until about 1 week after you are finished with it.   check your blood sugar twice a day.  vary the time of day when you check, between before the 3 meals, and at bedtime.  also check if you have symptoms of your blood sugar being too high or too low.  please keep a record of the readings and bring it to your next appointment here.  You can write it on any piece of paper.  please call us sooner if your blood sugar goes below 70, or if you have a lot of readings over 200.  Please come back for a follow-up appointment in 1 month.

## 2015-06-23 NOTE — Addendum Note (Signed)
Addended by: Kathi Simpers F on: 06/23/2015 04:36 PM   Modules accepted: Orders

## 2015-06-27 ENCOUNTER — Ambulatory Visit: Payer: BC Managed Care – PPO

## 2015-06-27 ENCOUNTER — Encounter: Payer: Self-pay | Admitting: Physical Therapy

## 2015-06-27 ENCOUNTER — Ambulatory Visit (INDEPENDENT_AMBULATORY_CARE_PROVIDER_SITE_OTHER): Payer: BC Managed Care – PPO | Admitting: Physical Therapy

## 2015-06-27 DIAGNOSIS — M436 Torticollis: Secondary | ICD-10-CM | POA: Diagnosis not present

## 2015-06-27 DIAGNOSIS — R29898 Other symptoms and signs involving the musculoskeletal system: Secondary | ICD-10-CM

## 2015-06-27 DIAGNOSIS — R293 Abnormal posture: Secondary | ICD-10-CM | POA: Diagnosis not present

## 2015-06-27 DIAGNOSIS — M542 Cervicalgia: Secondary | ICD-10-CM | POA: Diagnosis not present

## 2015-06-27 NOTE — Therapy (Signed)
[facility] [place] [address] [place], [place], [address] Phone: [phone]   Fax:  [phone]  Physical Therapy Evaluation  Patient Details  Name: Jeff [name] MRN: [id number] Date of Birth: Jul 01, 1950 Referring Provider: Dr Pearletha Forge  Encounter Date: 06/27/2015      PT End of Session - 06/27/15 0752    Visit Number 1   Number of Visits 12   Date for PT Re-Evaluation 08/08/15   PT Start Time 0753   PT Stop Time 0853   PT Time Calculation (min) 60 min   Activity Tolerance Patient tolerated treatment well      Past Medical History  Diagnosis Date  . Diabetes mellitus   . Hypertension   . Hyperlipidemia   . ADHD (attention deficit hyperactivity disorder)     Past Surgical History  Procedure Laterality Date  . Neck surgery    . Spine surgery      Cervical spine x 2; Vear Clock; Critzer.    There were no vitals filed for this visit.  Visit Diagnosis:  Pain, neck - Plan: PT plan of care cert/re-cert  Abnormal posture - Plan: PT plan of care cert/re-cert  Weakness of both arms - Plan: PT plan of care cert/re-cert  Stiffness of neck - Plan: PT plan of care cert/re-cert      Subjective Assessment - 06/27/15 0747    Subjective Patient reports that ion 12/23 he went to sit on the edge of the bed and slipped, fell and hit his head. Had a cervical collar initially until they found there was no fracture, now he only wears it at night to help him sleep.     Pertinent History 2 cervical surgeries C5-7 fusion then ~ 2012 had a plate put in. 3 yrs ago he fell and loosened a screw. Has two more days of prednisone.    How long can you sit comfortably? has to use head support if he sits for a long time.    How long can you walk comfortably? OK   Diagnostic tests x-ray and CT scan showed no fracture.    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    Currently in Pain? Yes   Pain Score 3     Pain Location Neck   Pain Orientation Left;Right   Pain Descriptors / Indicators --  crick in neck sensation   Pain Type Acute pain   Pain Radiating Towards to mid shoulder.    Pain Frequency Constant   Aggravating Factors  sleeping,  sitting and having to hold his head up.    Pain Relieving Factors wearing the collar, resting his head against a wall.             [facility] PT Assessment - 06/27/15 0001    Assessment   Medical Diagnosis Neck injury, cervical radiculopathy   Referring Provider Dr Pearletha Forge   Onset Date/Surgical Date 05/27/15   Hand Dominance Right   Next MD Visit 07/22/15   Prior Therapy not for this, PT prior to his surgeries   Precautions   Precautions None   Balance Screen   Has the patient fallen in the past 6 months Yes   How many times? 1  getting up in the middle of the night to use BR   Has the patient had a decrease in activity level because of a fear of falling?  No   Is the patient reluctant to leave their home because of  a fear of falling?  No   Home Tourist information centre manager residence   Living Arrangements Spouse/significant other   Prior Function   Level of Independence Independent   Vocation Full time employment   Pension scheme manager, interview,    Leisure travel, play with grandsons, 6 yo, 9yo - rough housing.    Observation/Other Assessments   Focus on Therapeutic Outcomes (FOTO)  50% limitation   Posture/Postural Control   Posture/Postural Control Postural limitations   Postural Limitations Forward head;Rounded Shoulders;Increased thoracic kyphosis   ROM / Strength   AROM / PROM / Strength AROM;Strength   AROM   AROM Assessment Site Cervical;Shoulder   Right/Left Shoulder Right;Left  pain with overhead reaching.    Right Shoulder Flexion 135 Degrees   Left Shoulder Flexion 153 Degrees   Cervical Flexion WNL  with pain   Cervical Extension decreased 50% with pain   Cervical - Right Rotation 45   Cervical - Left  Rotation 60   Strength   Overall Strength Comments mid back 4/5,pain with testing the shoulders.    Strength Assessment Site Shoulder   Right/Left Shoulder Right;Left   Right Shoulder Flexion 4+/5   Right Shoulder ABduction --  5-/5   Right Shoulder Internal Rotation 5/5   Right Shoulder External Rotation 4+/5   Left Shoulder Flexion 4+/5   Left Shoulder ABduction --  5-/5   Left Shoulder Internal Rotation 5/5   Left Shoulder External Rotation 4+/5   Flexibility   Soft Tissue Assessment /Muscle Length --  very tight pecs bilat   Palpation   Palpation comment pt has a lot of muscular tightness in his upper back, pericervical, mid thoracic and pecs.                     [facility] Adult PT Treatment/Exercise - 06/27/15 0001    Exercises   Exercises Neck   Neck Exercises: Standing   Neck Retraction 10 reps   Other Standing Exercises scapular retraction   Other Standing Exercises side bend stretch, doorway stretch mid and low.    Modalities   Modalities Electrical Stimulation;Moist Heat   Moist Heat Therapy   Number Minutes Moist Heat 15 Minutes   Moist Heat Location Cervical   Electrical Stimulation   Electrical Stimulation Location cervical   Electrical Stimulation Action IFC    Electrical Stimulation Parameters to tolerance   Electrical Stimulation Goals Tone;Pain                PT Education - 06/27/15 [phone]    Education provided Yes   Education Details HEP   Person(s) Educated Patient             PT Long Term Goals - 06/27/15 0917    PT LONG TERM GOAL #1   Title I with advanced HEP ( 08/08/15)    Time 6   Period Weeks   Status New   PT LONG TERM GOAL #2   Title increase cervical rotation =/> 70 degrees bilat ( 08/08/15)    Time 6   Period Weeks   Status New   PT LONG TERM GOAL #3   Title demo shoulder strength =/> 5-/5 through out with out pain to allow him to play with his grandsons( 08/08/15)    Time 6   Period Weeks   Status New   PT LONG  TERM GOAL #4   Title report overall pain decrease =/> 75% to allow him to return to work (  08/08/15)    Time 6   Period Weeks   Status New   PT LONG TERM GOAL #5   Title improve FOTO =/< 37% limited ( 08/08/15   Time 6   Period Weeks   Status New               Plan - 06/27/15 0848    Clinical Impression Statement 65 yo male presents with 2 month h/o neck pain afer falling and hitting his head.  He is very tight all through his upper back and neck, has significant postural changes also.  He is very active and wishes to return to his prior level of activity. He is unable to work at this time as he has pain with holding his head upright.    Pt will benefit from skilled therapeutic intervention in order to improve on the following deficits Postural dysfunction;Decreased strength;Pain;Decreased range of motion;Increased muscle spasms   Rehab Potential Excellent   PT Frequency 2x / week   PT Duration 6 weeks   PT Treatment/Interventions Manual techniques;Therapeutic exercise;Moist Heat;Dry needling;Cryotherapy;Electrical Stimulation;Patient/family education;Passive range of motion;Ultrasound;Traction   PT Next Visit Plan possible dry needling, manual work and postural ther ex   Consulted and Agree with Plan of Care Patient         Problem List Patient Active Problem List   Diagnosis Date Noted  . Neck injury 06/22/2015  . Hearing loss in right ear 10/14/2011  . Agent orange exposure 10/14/2011  . DM (diabetes mellitus) (HCC) 10/14/2011  . ADD (attention deficit disorder) 10/14/2011  . Hyperlipidemia 10/14/2011  . BMI 28.0-28.9,adult 10/14/2011    Roderic Scarce PT 06/27/2015, 9:23 AM  [facility] [address] [place], [place], [address] Phone: [phone]   Fax:  [phone]  Name: Jeff [name] MRN: [id number] Date of Birth: July 04, 1950

## 2015-06-27 NOTE — Patient Instructions (Signed)
Strengthening: Shoulder Shrug (Phase 1)    Shrug shoulders up and down, forward and backward. Repeat _101___ times per set. Do ____ sets per session. Do __3-4__ sessions per day.  Flexibility: Upper Trapezius Stretch    Gently grasp right side of head while reaching behind back with other hand. Tilt head away until a gentle stretch is felt. Hold _20-30___ seconds. Repeat __1__ times per set. Do __1__ sets per session. Do _3-4___ sessions per day.  Flexibility: Neck Retraction    Pull head straight back, keeping eyes and jaw level. Repeat _10___ times per set. Do __1__ sets per session. Do _3-4___ sessions per day.  Scapular Retraction (Standing)    With arms at sides, pinch shoulder blades together. Repeat __10__ times per set. Do __1__ sets per session. Do _3-4___ sessions per day.  Scapula Adduction With Pectorals, Low   Stand in doorframe with palms against frame and arms at 45. Lean forward and squeeze shoulder blades. Hold _30__ seconds. Repeat _1__ times per session. Do _1-2__ sessions per day.  Scapula Adduction With Pectorals, Mid-Range   Stand in doorframe with palms against frame and arms at 90. Lean forward and squeeze shoulder blades. Hold _30__ seconds. Repeat _1__ times per session. Do _1-2__ sessions per day.  Trigger Point Dry Needling  . What is Trigger Point Dry Needling (DN)? o DN is a physical therapy technique used to treat muscle pain and dysfunction. Specifically, DN helps deactivate muscle trigger points (muscle knots).  o A thin filiform needle is used to penetrate the skin and stimulate the underlying trigger point. The goal is for a local twitch response (LTR) to occur and for the trigger point to relax. No medication of any kind is injected during the procedure.   . What Does Trigger Point Dry Needling Feel Like?  o The procedure feels different for each individual patient. Some patients report that they do not actually feel the needle  enter the skin and overall the process is not painful. Very mild bleeding may occur. However, many patients feel a deep cramping in the muscle in which the needle was inserted. This is the local twitch response.   Marland Kitchen How Will I feel after the treatment? o Soreness is normal, and the onset of soreness may not occur for a few hours. Typically this soreness does not last longer than two days.  o Bruising is uncommon, however; ice can be used to decrease any possible bruising.  o In rare cases feeling tired or nauseous after the treatment is normal. In addition, your symptoms may get worse before they get better, this period will typically not last longer than 24 hours.   . What Can I do After My Treatment? o Increase your hydration by drinking more water for the next 24 hours. o You may place ice or heat on the areas treated that have become sore, however, do not use heat on inflamed or bruised areas. Heat often brings more relief post needling. o You can continue your regular activities, but vigorous activity is not recommended initially after the treatment for 24 hours. o DN is best combined with other physical therapy such as strengthening, stretching, and other therapies.

## 2015-06-28 ENCOUNTER — Other Ambulatory Visit: Payer: Self-pay

## 2015-06-28 ENCOUNTER — Telehealth: Payer: Self-pay | Admitting: Family Medicine

## 2015-06-28 MED ORDER — V-GO 20 KIT: PACK | Status: DC

## 2015-06-28 NOTE — Telephone Encounter (Signed)
Spoke to patient. Stated he would come by to get prescription.

## 2015-06-29 ENCOUNTER — Ambulatory Visit (INDEPENDENT_AMBULATORY_CARE_PROVIDER_SITE_OTHER): Payer: BC Managed Care – PPO | Admitting: Physical Therapy

## 2015-06-29 ENCOUNTER — Encounter: Payer: Self-pay | Admitting: Physical Therapy

## 2015-06-29 ENCOUNTER — Telehealth: Payer: Self-pay | Admitting: Endocrinology

## 2015-06-29 DIAGNOSIS — R293 Abnormal posture: Secondary | ICD-10-CM | POA: Diagnosis not present

## 2015-06-29 DIAGNOSIS — R29898 Other symptoms and signs involving the musculoskeletal system: Secondary | ICD-10-CM

## 2015-06-29 DIAGNOSIS — M542 Cervicalgia: Secondary | ICD-10-CM

## 2015-06-29 DIAGNOSIS — M436 Torticollis: Secondary | ICD-10-CM | POA: Diagnosis not present

## 2015-06-29 NOTE — Telephone Encounter (Signed)
Please increase the "clicks" to the maximun of 19 total over the course of a day. If sugar is still over 200 despite this, please call so we can add insulin injections Are you finished with the prednisone? If so, the effect of the prednisone will be gone is a few days.

## 2015-06-29 NOTE — Telephone Encounter (Signed)
See note below and please advise. Thanks! 

## 2015-06-29 NOTE — Telephone Encounter (Signed)
Pt's sugar readings have been very high since the prednisone most recently: 1/23 at 10:25 AM 396; 8:50 PM 401 1/24 at 7:15 AM 317; 8:47 PM 426; 11:58 PM 484;  1/25 12:52 AM 523; 11:06 today 299 Pt has had nothing to eat one diet coke and everything else has just been water.

## 2015-06-29 NOTE — Patient Instructions (Signed)
TENS UNIT: This is helpful for muscle pain and spasm.   Search and Purchase a TENS 7000 2nd edition at www.tenspros.com. It should be less than $30.     TENS unit instructions: Do not shower or bathe with the unit on Turn the unit off before removing electrodes or batteries If the electrodes lose stickiness add a drop of water to the electrodes after they are disconnected from the unit and place on plastic sheet. If you continued to have difficulty, call the TENS unit company to purchase more electrodes. Do not apply lotion on the skin area prior to use. Make sure the skin is clean and dry as this will help prolong the life of the electrodes. After use, always check skin for unusual red areas, rash or other skin difficulties. If there are any skin problems, does not apply electrodes to the same area. Never remove the electrodes from the unit by pulling the wires. Do not use the TENS unit or electrodes other than as directed. Do not change electrode placement without consultating your therapist or physician. Keep 2 fingers with between each electrode. Wear time ratio is 2:1, on to off times.    For example on for 30 minutes off for 15 minutes and then on for 30 minutes off for 15 minutes   

## 2015-06-29 NOTE — Telephone Encounter (Signed)
Pt notified of instructions below and verbalized understanding. The pt's prednisone dosage ends tomorrow.

## 2015-06-29 NOTE — Therapy (Signed)
[place], [place], [address] Phone: [phone]   Fax:  [phone]  Physical Therapy Treatment  Patient Details  Name: Jeff [name] MRN: [id number] Date of Birth: Jan 24, 1951 Referring Provider: Dr Barbaraann Barthel  Encounter Date: 06/29/2015      PT End of Session - 06/29/15 0859    Visit Number 2   Number of Visits 12   Date for PT Re-Evaluation 08/08/15   PT Start Time 0859   PT Stop Time 0954   PT Time Calculation (min) 55 min      Past Medical History  Diagnosis Date  . Diabetes mellitus   . Hypertension   . Hyperlipidemia   . ADHD (attention deficit hyperactivity disorder)     Past Surgical History  Procedure Laterality Date  . Neck surgery    . Spine surgery      Cervical spine x 2; Hardin Negus; Critzer.    There were no vitals filed for this visit.  Visit Diagnosis:  Pain, neck  Abnormal posture  Weakness of both arms  Stiffness of neck      Subjective Assessment - 06/29/15 0944    Subjective Went to MD office to get an order for a TENs , called his insurance to see if it was covered.    Currently in Pain? Yes   Pain Score 2                          [facility] Adult PT Treatment/Exercise - 06/29/15 0001    Self-Care   Self-Care Posture   Posture discussed posture with sleep and using a pillow that supports neck in good alignment   Neck Exercises: Seated   Other Seated Exercise pulleys flexion    Neck Exercises: Supine   Cervical Isometrics 10 reps  head presses into pillow   Other Supine Exercise 10 reps each, red, decreased to yellow band, overhead, horizontal abduction & ER   Modalities   Modalities Electrical Stimulation;Moist Heat   Moist Heat Therapy   Number Minutes Moist Heat 15 Minutes   Moist Heat Location Cervical   Electrical Stimulation   Electrical Stimulation Location cervical   Electrical Stimulation Action IFC   Electrical Stimulation  Parameters  to tolerance   Electrical Stimulation Goals Tone;Pain   Manual Therapy   Manual Therapy Soft tissue mobilization   Soft tissue mobilization bilat upper traps and periocciput          Trigger Point Dry Needling - 06/29/15 0940    Consent Given? Yes   Education Handout Provided Yes   Muscles Treated Upper Body Upper trapezius;Oblique capitus   Upper Trapezius Response Twitch reponse elicited;Palpable increased muscle length   Oblique Capitus Response Twitch response elicited;Palpable increased muscle length                   PT Long Term Goals - 06/27/15 0917    PT LONG TERM GOAL #1   Title I with advanced HEP ( 08/08/15)    Time 6   Period Weeks   Status New   PT LONG TERM GOAL #2   Title increase cervical rotation =/> 70 degrees bilat ( 08/08/15)    Time 6   Period Weeks   Status New   PT LONG TERM GOAL #3   Title demo shoulder strength =/> 5-/5 through out with out pain to allow him to play with his grandsons( 08/08/15)    Time  6   Period Weeks   Status New   PT LONG TERM GOAL #4   Title report overall pain decrease =/> 75% to allow him to return to work ( 08/08/15)    Time 6   Period Weeks   Status New   PT LONG TERM GOAL #5   Title improve FOTO =/< 37% limited ( 08/08/15   Time 6   Period Weeks   Status New               Plan - 06/29/15 1038    Clinical Impression Statement This is Jays second visit, no goals met.  He did have palpable decrease in muscle tightness in his neck after todays treatment.     Pt will benefit from skilled therapeutic intervention in order to improve on the following deficits Postural dysfunction;Decreased strength;Pain;Decreased range of motion;Increased muscle spasms   PT Treatment/Interventions Manual techniques;Therapeutic exercise;Moist Heat;Dry needling;Cryotherapy;Electrical Stimulation;Patient/family education;Passive range of motion;Ultrasound;Traction   PT Next Visit Plan assess response to TDN. Manual work  , PROM cervical  and stab exercise.    Consulted and Agree with Plan of Care Patient        Problem List Patient Active Problem List   Diagnosis Date Noted  . Neck injury 06/22/2015  . Hearing loss in right ear 10/14/2011  . Agent orange exposure 10/14/2011  . DM (diabetes mellitus) ([place]) 10/14/2011  . ADD (attention deficit disorder) 10/14/2011  . Hyperlipidemia 10/14/2011  . BMI 28.0-28.9,adult 10/14/2011    Jeral Pinch PT 06/29/2015, 10:40 AM  [facility] [place], [place], [address] Phone: [phone]   Fax:  [phone]  Name: Jeff [name] MRN: [id number] Date of Birth: 10/15/1950

## 2015-07-04 ENCOUNTER — Encounter: Payer: Self-pay | Admitting: Rehabilitative and Restorative Service Providers"

## 2015-07-04 ENCOUNTER — Ambulatory Visit (INDEPENDENT_AMBULATORY_CARE_PROVIDER_SITE_OTHER): Payer: BC Managed Care – PPO | Admitting: Rehabilitative and Restorative Service Providers"

## 2015-07-04 ENCOUNTER — Telehealth: Payer: Self-pay | Admitting: Family Medicine

## 2015-07-04 ENCOUNTER — Encounter: Payer: Self-pay | Admitting: *Deleted

## 2015-07-04 DIAGNOSIS — M542 Cervicalgia: Secondary | ICD-10-CM

## 2015-07-04 DIAGNOSIS — R29898 Other symptoms and signs involving the musculoskeletal system: Secondary | ICD-10-CM

## 2015-07-04 DIAGNOSIS — R293 Abnormal posture: Secondary | ICD-10-CM | POA: Diagnosis not present

## 2015-07-04 DIAGNOSIS — M436 Torticollis: Secondary | ICD-10-CM

## 2015-07-04 NOTE — Telephone Encounter (Signed)
The hope was he would be feeling better by this point to return to work.  We wanted to see him 4 weeks from last appointment for a recheck (so 2 weeks from now).  If he is still not progressed enough at this point please write a letter for out of work 2 more weeks.  We will see him back then.

## 2015-07-04 NOTE — Therapy (Signed)
[facility] [place] [address] [place], [place], [address] Phone: [phone]   Fax:  [phone]  Physical Therapy Treatment  Patient Details  Name: Jeff [name] MRN: [id number] Date of Birth: 19-Nov-1950 Referring Provider: Dr Pearletha Forge  Encounter Date: 07/04/2015      PT End of Session - 07/04/15 1017    Visit Number 3   Number of Visits 12   Date for PT Re-Evaluation 08/08/15   PT Start Time 0804   PT Stop Time 0859   PT Time Calculation (min) 55 min   Activity Tolerance Patient tolerated treatment well      Past Medical History  Diagnosis Date  . Diabetes mellitus   . Hypertension   . Hyperlipidemia   . ADHD (attention deficit hyperactivity disorder)     Past Surgical History  Procedure Laterality Date  . Neck surgery    . Spine surgery      Cervical spine x 2; Vear Clock; Critzer.    There were no vitals filed for this visit.  Visit Diagnosis:  Pain, neck  Abnormal posture  Weakness of both arms  Stiffness of neck      Subjective Assessment - 07/04/15 0809    Subjective Tori reports that he had good good response from the dry needling but his neck is still stiff and hurting. His TENS unit is coming in today.    Currently in Pain? Yes   Pain Score 3    Pain Location Neck   Pain Orientation Left;Right   Pain Descriptors / Indicators Dull;Aching  crick in neck   Pain Type Acute pain                         [facility] Adult PT Treatment/Exercise - 07/04/15 0001    Therapeutic Activites    Therapeutic Activities --  myofacial ball release work    Neuro Re-ed    Neuro Re-ed Details  working on posture and alignment - position of head/neck on chest    Neck Exercises: Machines for Strengthening   UBE (Upper Arm Bike) L1 2 min total 1 fwd/1 back   some difficulty - glad to stop - no c/o pain    Neck Exercises: Standing   Neck Retraction 10 reps;5 secs   Other Standing Exercises scap squeeze  with noodle 10 sec hold x 10 reps    Other Standing Exercises doorway stretch mid and low. 20-30 sec x 3    Neck Exercises: Supine   Neck Retraction 10 reps;10 secs   Modalities   Modalities Electrical Stimulation;Moist Heat   Moist Heat Therapy   Number Minutes Moist Heat 15 Minutes   Moist Heat Location Cervical   Electrical Stimulation   Electrical Stimulation Location cervical   Electrical Stimulation Action IFC   Electrical Stimulation Parameters to tolerance    Electrical Stimulation Goals Tone;Pain   Manual Therapy   Manual Therapy Soft tissue mobilization   Manual therapy comments pt supine    Soft tissue mobilization ant/lat/post cervical musculature; upper traps/ leveator; suboccipitals   very tender SCM bilat; TP Rt cervical paraspinals   Myofascial Release upper traps/chest   Manual Traction gentle distraction in supine                 PT Education - 07/04/15 0847    Education provided Yes   Education Details posture/alignment; HEP   Person(s) Educated Patient   Methods Explanation;Demonstration;Tactile cues;Verbal cues;Handout   Comprehension Verbalized  understanding;Returned demonstration;Verbal cues required;Tactile cues required             PT Long Term Goals - 07/04/15 1021    PT LONG TERM GOAL #1   Title I with advanced HEP ( 08/08/15)    Time 6   Period Weeks   Status On-going   PT LONG TERM GOAL #2   Title increase cervical rotation =/> 70 degrees bilat ( 08/08/15)    Time 6   Period Weeks   Status On-going   PT LONG TERM GOAL #3   Title demo shoulder strength =/> 5-/5 through out with out pain to allow him to play with his grandsons( 08/08/15)    Time 6   Period Weeks   Status On-going   PT LONG TERM GOAL #4   Title report overall pain decrease =/> 75% to allow him to return to work ( 08/08/15)    Time 6   Period Weeks   Status On-going   PT LONG TERM GOAL #5   Title improve FOTO =/< 37% limited ( 08/08/15   Time 6   Period Weeks    Status On-going               Plan - 07/04/15 1018    Clinical Impression Statement Continued poor posture and alignment; limited cervical mobility and tightness/tenderness to palpation through cervical and thoracic musculature. Pt responded well to TDN and manual work. Progressing gradually toward stated goals of therapy.    Pt will benefit from skilled therapeutic intervention in order to improve on the following deficits Postural dysfunction;Decreased strength;Pain;Decreased range of motion;Increased muscle spasms   Rehab Potential Excellent   PT Frequency 2x / week   PT Duration 6 weeks   PT Treatment/Interventions Manual techniques;Therapeutic exercise;Moist Heat;Dry needling;Cryotherapy;Electrical Stimulation;Patient/family education;Passive range of motion;Ultrasound;Traction   PT Next Visit Plan assess response to TDN. Manual work , PROM cervical  and stab exercise.    PT Home Exercise Plan HEP; myofacial release work; postural correction    Consulted and Agree with Plan of Care Patient        Problem List Patient Active Problem List   Diagnosis Date Noted  . Neck injury 06/22/2015  . Hearing loss in right ear 10/14/2011  . Agent orange exposure 10/14/2011  . DM (diabetes mellitus) (HCC) 10/14/2011  . ADD (attention deficit disorder) 10/14/2011  . Hyperlipidemia 10/14/2011  . BMI 28.0-28.9,adult 10/14/2011    Grigor Lipschutz Rober Minion PT, MPH  07/04/2015, 10:23 AM  [facility] [address] [place], [place], [address] Phone: [phone]   Fax:  [phone]  Name: Jeff [name] MRN: [id number] Date of Birth: 1951/02/08

## 2015-07-04 NOTE — Patient Instructions (Signed)
Massage using a 4 inch rubber ball.   Work on lifting chest; improving posture and alignment;   Axial Extension (Chin Tuck)    Pull chin in and lengthen back of neck. Hold __10__ seconds while counting out loud. Repeat __10__ times. Do __several__ sessions per day.   Shoulder Blade Squeeze    Rotate shoulders back, then squeeze shoulder blades down and back Hold 10 sec  Repeat __10__ times. Do _several___ sessions per day.

## 2015-07-05 NOTE — Telephone Encounter (Signed)
Letter written

## 2015-07-06 ENCOUNTER — Encounter: Payer: BC Managed Care – PPO | Admitting: Physical Therapy

## 2015-07-07 ENCOUNTER — Ambulatory Visit (INDEPENDENT_AMBULATORY_CARE_PROVIDER_SITE_OTHER): Payer: BC Managed Care – PPO | Admitting: Physical Therapy

## 2015-07-07 ENCOUNTER — Encounter: Payer: Self-pay | Admitting: Physical Therapy

## 2015-07-07 DIAGNOSIS — M542 Cervicalgia: Secondary | ICD-10-CM

## 2015-07-07 DIAGNOSIS — R293 Abnormal posture: Secondary | ICD-10-CM

## 2015-07-07 DIAGNOSIS — M436 Torticollis: Secondary | ICD-10-CM

## 2015-07-07 NOTE — Patient Instructions (Signed)
Strengthening: Shoulder Shrug (Phase 1) - only move shoulders up and down.     Shrug shoulders up and down, move up to a count of 2 and lower to a count of 5. Repeat _10___ times per set. Do __1__ sets per session. Do __3-4__ sessions per day.  http://orth.exer.us/336   Copyright  VHI. All rights reserved.

## 2015-07-07 NOTE — Therapy (Signed)
[facility] [place] [address] [place], [place], [address] Phone: [phone]   Fax:  [phone]  Physical Therapy Treatment  Patient Details  Name: ESTILL LLERENA MRN: [id number] Date of Birth: 07/06/1950 Referring Provider: Dr Pearletha Forge  Encounter Date: 07/07/2015      PT End of Session - 07/07/15 1542    Visit Number 4   Number of Visits 12   Date for PT Re-Evaluation 08/08/15   PT Start Time 1538   PT Stop Time 1618   PT Time Calculation (min) 40 min      Past Medical History  Diagnosis Date  . Diabetes mellitus   . Hypertension   . Hyperlipidemia   . ADHD (attention deficit hyperactivity disorder)     Past Surgical History  Procedure Laterality Date  . Neck surgery    . Spine surgery      Cervical spine x 2; Vear Clock; Critzer.    There were no vitals filed for this visit.  Visit Diagnosis:  Pain, neck  Abnormal posture  Stiffness of neck      Subjective Assessment - 07/07/15 1542    Subjective Pt reports he is very stiff today on the Rt side.    Currently in Pain? Yes   Pain Score 4                          [facility] Adult PT Treatment/Exercise - 07/07/15 0001    Self-Care   Self-Care --  use of TENS and electrode placement   Neck Exercises: Machines for Strengthening   UBE (Upper Arm Bike) L1x4' alt FWD/BWD   Neck Exercises: Standing   Other Standing Exercises shoulder shrugs   Other Standing Exercises doorway stretch mid and low. 20-30 sec x 3    Manual Therapy   Manual Therapy Soft tissue mobilization   Manual therapy comments pt supine    Soft tissue mobilization bilat upper trap, peri occiput          Trigger Point Dry Needling - 07/07/15 1602    Consent Given? Yes   Education Handout Provided No   Muscles Treated Upper Body Upper trapezius   Upper Trapezius Response Twitch reponse elicited;Palpable increased muscle length  Lt /Rt                   PT Long  Term Goals - 07/04/15 1021    PT LONG TERM GOAL #1   Title I with advanced HEP ( 08/08/15)    Time 6   Period Weeks   Status On-going   PT LONG TERM GOAL #2   Title increase cervical rotation =/> 70 degrees bilat ( 08/08/15)    Time 6   Period Weeks   Status On-going   PT LONG TERM GOAL #3   Title demo shoulder strength =/> 5-/5 through out with out pain to allow him to play with his grandsons( 08/08/15)    Time 6   Period Weeks   Status On-going   PT LONG TERM GOAL #4   Title report overall pain decrease =/> 75% to allow him to return to work ( 08/08/15)    Time 6   Period Weeks   Status On-going   PT LONG TERM GOAL #5   Title improve FOTO =/< 37% limited ( 08/08/15   Time 6   Period Weeks   Status On-going  Plan - 07/07/15 1754    Clinical Impression Statement Pt presented very stiff and with limited cervical motion, this began to improve with ther ex and then had more improvement after TDN and manual therapy.  He reports he will use heat and TENs unit when he gets home.    Rehab Potential Excellent   PT Frequency 2x / week   PT Duration 6 weeks   PT Treatment/Interventions Manual techniques;Therapeutic exercise;Moist Heat;Dry needling;Cryotherapy;Electrical Stimulation;Patient/family education;Passive range of motion;Ultrasound;Traction   PT Next Visit Plan assess response to TDN. Manual work , PROM cervical  and stab exercise.    Consulted and Agree with Plan of Care Patient        Problem List Patient Active Problem List   Diagnosis Date Noted  . Neck injury 06/22/2015  . Hearing loss in right ear 10/14/2011  . Agent orange exposure 10/14/2011  . DM (diabetes mellitus) (HCC) 10/14/2011  . ADD (attention deficit disorder) 10/14/2011  . Hyperlipidemia 10/14/2011  . BMI 28.0-28.9,adult 10/14/2011    Roderic Scarce PT 07/07/2015, 5:58 PM  [facility] [address] [place], [place],  [address] Phone: [phone]   Fax:  [phone]  Name: NELVIN TOMB MRN: [id number] Date of Birth: [birth year]

## 2015-07-11 ENCOUNTER — Encounter: Payer: Self-pay | Admitting: Physical Therapy

## 2015-07-11 ENCOUNTER — Ambulatory Visit (INDEPENDENT_AMBULATORY_CARE_PROVIDER_SITE_OTHER): Payer: BC Managed Care – PPO | Admitting: Physical Therapy

## 2015-07-11 DIAGNOSIS — R293 Abnormal posture: Secondary | ICD-10-CM

## 2015-07-11 DIAGNOSIS — M542 Cervicalgia: Secondary | ICD-10-CM

## 2015-07-11 DIAGNOSIS — M436 Torticollis: Secondary | ICD-10-CM | POA: Diagnosis not present

## 2015-07-11 DIAGNOSIS — R29898 Other symptoms and signs involving the musculoskeletal system: Secondary | ICD-10-CM

## 2015-07-11 NOTE — Patient Instructions (Addendum)
Over Head Pull: Narrow Grip     [place] [phone]   On back, knees bent, feet flat, band across thighs, elbows straight but relaxed. Pull hands apart (start). Keeping elbows straight, bring arms up and over head, hands toward floor. Keep pull steady on band. Hold momentarily. Return slowly, keeping pull steady, back to start. Repeat _20-30__ times. Band color __red____   Side Pull: Double Arm   On back, knees bent, feet flat. Arms perpendicular to body, shoulder level, elbows straight but relaxed. Pull arms out to sides, elbows straight. Resistance band comes across collarbones, hands toward floor. Hold momentarily. Slowly return to starting position. Repeat _20-30__ times. Band color _red____   Jeff [name]   On back, knees bent, feet flat, left hand on left hip, right hand above left. Pull right arm DIAGONALLY (hip to shoulder) across chest. Bring right arm along head toward floor. Hold momentarily. Slowly return to starting position. Repeat _20-30__ times. Do with left arm. Band color ___red___   Shoulder Rotation: Double Arm   On back, knees bent, feet flat, elbows tucked at sides, bent 90, hands palms up. Pull hands apart and down toward floor, keeping elbows near sides. Hold momentarily. Slowly return to starting position. Repeat _20-30__ times. Band color __red____

## 2015-07-11 NOTE — Therapy (Signed)
[facility] [place] [address] [place] [address] [place], [place], [address] Phone: [phone]   Fax:  [phone]  Physical Therapy Treatment  Patient Details  Name: Jeff [name] MRN: [id number] Date of Birth: April 13, 1951 Referring Provider: Dr Pearletha Forge  Encounter Date: 07/11/2015      PT End of Session - 07/11/15 0806    Visit Number 5   Number of Visits 12   Date for PT Re-Evaluation 08/08/15   PT Start Time 0802   PT Stop Time 0906   PT Time Calculation (min) 64 min   Activity Tolerance Patient tolerated treatment well      Past Medical History  Diagnosis Date  . Diabetes mellitus   . Hypertension   . Hyperlipidemia   . ADHD (attention deficit hyperactivity disorder)     Past Surgical History  Procedure Laterality Date  . Neck surgery    . Spine surgery      Cervical spine x 2; Vear Clock; Critzer.    There were no vitals filed for this visit.  Visit Diagnosis:  Pain, neck  Abnormal posture  Stiffness of neck  Weakness of both arms      Subjective Assessment - 07/11/15 0805    Subjective Pt states he is doing so much better, only has pain when going to the Rt cervical rotation   Pertinent History 2 cervical surgeries C5-7 fusion then ~ 2012 had a plate put in. 3 yrs ago he fell and loosened a screw. Has two more days of prednisone.    Currently in Pain? No/denies                         [facility] Adult PT Treatment/Exercise - 07/11/15 0001    Neck Exercises: Machines for Strengthening   UBE (Upper Arm Bike) L1x4' alt FWD/BWD   Neck Exercises: Supine   Cervical Isometrics 10 reps  into pillow, then with tongue press to roof of mouth.    Other Supine Exercise 20 reps, red band supine scap stab ex per HEP    Modalities   Modalities Electrical Stimulation;Moist Heat   Moist Heat Therapy   Number Minutes Moist Heat 15 Minutes   Moist Heat Location Cervical   Electrical Stimulation   Electrical Stimulation  Location cervical   Electrical Stimulation Action IFC   Electrical Stimulation Parameters to tolerance   Electrical Stimulation Goals Tone;Pain   Manual Therapy   Manual Therapy Soft tissue mobilization;Manual Traction   Manual therapy comments pt supine    Soft tissue mobilization bilat upper trap, peri occiput   Manual Traction gentle cervical traction an PROM side bend and rotation          Trigger Point Dry Needling - 07/11/15 0828    Consent Given? Yes   Education Handout Provided No   Muscles Treated Upper Body Upper trapezius  Rt   Upper Trapezius Response Palpable increased muscle length;Twitch reponse elicited              PT Education - 07/11/15 0811    Education provided Yes   Education Details HEP for neck stabilization    Person(s) Educated Patient   Methods Demonstration;Handout;Explanation   Comprehension Verbalized understanding;Returned demonstration             PT Long Term Goals - 07/04/15 1021    PT LONG TERM GOAL #1   Title I with advanced HEP ( 08/08/15)    Time 6   Period Weeks  Status On-going   PT LONG TERM GOAL #2   Title increase cervical rotation =/> 70 degrees bilat ( 08/08/15)    Time 6   Period Weeks   Status On-going   PT LONG TERM GOAL #3   Title demo shoulder strength =/> 5-/5 through out with out pain to allow him to play with his grandsons( 08/08/15)    Time 6   Period Weeks   Status On-going   PT LONG TERM GOAL #4   Title report overall pain decrease =/> 75% to allow him to return to work ( 08/08/15)    Time 6   Period Weeks   Status On-going   PT LONG TERM GOAL #5   Title improve FOTO =/< 37% limited ( 08/08/15   Time 6   Period Weeks   Status On-going               Plan - 07/11/15 0816    Clinical Impression Statement Johsua has had great results since last visit, cervical ROM to the Lt has maintained.  Rt is still limited with some pain.  Added strengthening to his HEP, Will see how he tolerates with increased  forces being placed on the neck.  He continue to do really well with all his stretches.    Pt will benefit from skilled therapeutic intervention in order to improve on the following deficits Postural dysfunction;Decreased strength;Pain;Decreased range of motion;Increased muscle spasms   Rehab Potential Excellent   PT Frequency 2x / week   PT Duration 6 weeks   PT Treatment/Interventions Manual techniques;Therapeutic exercise;Moist Heat;Dry needling;Cryotherapy;Electrical Stimulation;Patient/family education;Passive range of motion;Ultrasound;Traction   PT Next Visit Plan measure and write MD note, he has an appointment on Monday    Consulted and Agree with Plan of Care Patient        Problem List Patient Active Problem List   Diagnosis Date Noted  . Neck injury 06/22/2015  . Hearing loss in right ear 10/14/2011  . Agent orange exposure 10/14/2011  . DM (diabetes mellitus) (HCC) 10/14/2011  . ADD (attention deficit disorder) 10/14/2011  . Hyperlipidemia 10/14/2011  . BMI 28.0-28.9,adult 10/14/2011    Roderic Scarce PT  07/11/2015, 8:52 AM  [facility] [address] [place] [address] [place], [place], [address] Phone: [phone]   Fax:  [phone]  Name: Jeff [name] MRN: [id number] Date of Birth: Sep 24, 1950

## 2015-07-14 ENCOUNTER — Ambulatory Visit (INDEPENDENT_AMBULATORY_CARE_PROVIDER_SITE_OTHER): Payer: BC Managed Care – PPO | Admitting: Rehabilitative and Restorative Service Providers"

## 2015-07-14 DIAGNOSIS — R293 Abnormal posture: Secondary | ICD-10-CM | POA: Diagnosis not present

## 2015-07-14 DIAGNOSIS — M542 Cervicalgia: Secondary | ICD-10-CM

## 2015-07-14 DIAGNOSIS — R29898 Other symptoms and signs involving the musculoskeletal system: Secondary | ICD-10-CM | POA: Diagnosis not present

## 2015-07-14 DIAGNOSIS — M436 Torticollis: Secondary | ICD-10-CM | POA: Diagnosis not present

## 2015-07-14 NOTE — Therapy (Signed)
[facility] [place], [place], [address] Phone: [phone]   Fax:  [phone]  Physical Therapy Treatment  Patient Details  Name: Jeff [name] MRN: [id number] Date of Birth: 1951/01/02 Referring Provider: Dr Barbaraann Barthel  Encounter Date: 07/14/2015      PT End of Session - 07/14/15 0855    Visit Number 6   Number of Visits 12   Date for PT Re-Evaluation 08/08/15   PT Start Time 0800   PT Stop Time 0855   PT Time Calculation (min) 55 min   Activity Tolerance Patient tolerated treatment well      Past Medical History  Diagnosis Date  . Diabetes mellitus   . Hypertension   . Hyperlipidemia   . ADHD (attention deficit hyperactivity disorder)     Past Surgical History  Procedure Laterality Date  . Neck surgery    . Spine surgery      Cervical spine x 2; Hardin Negus; Critzer.    There were no vitals filed for this visit.  Visit Diagnosis:  Pain, neck  Abnormal posture  Stiffness of neck  Weakness of both arms      Subjective Assessment - 07/14/15 0759    Subjective Patient reports that he is doing much better. The dry needling has been very helpful. He can turn head to the Lt without difficulty now but continues to have some pain with turning his head to the Rt.    Pertinent History 2 cervical surgeries C5-7 fusion then ~ 2012 had a plate put in. 3 yrs ago he fell and loosened a screw. Has two more days of prednisone.    How long can you sit comfortably? no problems with sitting   Diagnostic tests x-ray and CT scan showed no fracture.    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    Currently in Pain? No/denies   Pain Score 3    Pain Location Neck  with turning head to the Rt    Pain Orientation Right   Pain Descriptors / Indicators Dull   Pain Type Acute pain   Pain Frequency Intermittent   Aggravating Factors  turning head to Rt; sometimes sore when he  awakens; sometimes in the late afternoon    Pain Relieving Factors dry needling; heat; hot shower             [facility] PT Assessment - 07/14/15 0001    AROM   AROM Assessment Site --  pain with Rt cervical rotation   Cervical Flexion 40   Cervical Extension 45   Cervical - Right Rotation 55   Cervical - Left Rotation 79   Strength   Right Shoulder Flexion 5/5   Right Shoulder ABduction 5/5   Right Shoulder Internal Rotation 5/5   Right Shoulder External Rotation 5/5   Left Shoulder Flexion 5/5   Left Shoulder ABduction 5/5   Left Shoulder Internal Rotation 5/5   Left Shoulder External Rotation 5/5   Palpation   Palpation comment muscular tightness in upper traps; leveator, pericervical, mid thoracic and pecs.                       [facility] Adult PT Treatment/Exercise - 07/14/15 0001    Therapeutic Activites    Therapeutic Activities --  ball release work Surveyor, mining Exercises: Machines for Strengthening   UBE (Upper Arm Bike) L1x4' alt FWD/BWD   Neck Exercises: Theraband  Scapula Retraction 10 reps;Red   Scapula Retraction Limitations with noodle along spine    Shoulder Extension 10 reps;Red   Rows 10 reps;Red   Neck Exercises: Standing   Neck Retraction 5 reps;10 secs   Other Standing Exercises shoulder shrugs/rolls x 10    Other Standing Exercises doorway stretch for pecs 3 positions 30 sec hold x 3 each verbal and tactile cuesfor form    Neck Exercises: Supine   Cervical Isometrics 10 reps  into pillow, then with tongue press to roof of mouth.    Other Supine Exercise 10 reps, red band supine scap stab ex per HEP    Modalities   Modalities Electrical Stimulation;Moist Heat   Moist Heat Therapy   Number Minutes Moist Heat 15 Minutes   Moist Heat Location Cervical   Electrical Stimulation   Electrical Stimulation Location cervical; leveator; origin or upper trap; pecs Rt    Electrical Stimulation Action IFC   Electrical Stimulation Parameters to  tolerance   Electrical Stimulation Goals Tone;Pain   Manual Therapy   Manual Therapy Soft tissue mobilization;Manual Traction   Manual therapy comments pt supine    Soft tissue mobilization bilat upper trap, peri occiput; pecs   Myofascial Release upper traps/chest   Manual Traction cervical traction                 PT Education - 07/14/15 0819    Education provided Yes   Education Details postural educaton; HEP    Person(s) Educated Patient   Methods Explanation;Demonstration;Tactile cues;Verbal cues;Handout   Comprehension Verbalized understanding;Returned demonstration;Verbal cues required;Tactile cues required             PT Long Term Goals - 07/14/15 0908    PT LONG TERM GOAL #1   Title I with advanced HEP ( 08/08/15)    Time 6   Period Weeks   Status On-going   PT LONG TERM GOAL #2   Title increase cervical rotation =/> 70 degrees bilat ( 08/08/15)    Baseline accomplished with Lt rotation   Time 6   Period Weeks   Status Partially Met   PT LONG TERM GOAL #3   Title demo shoulder strength =/> 5-/5 through out with out pain to allow him to play with his grandsons( 08/08/15)    Time 6   Period Weeks   Status Achieved   PT LONG TERM GOAL #4   Title report overall pain decrease =/> 75% to allow him to return to work ( 08/08/15)    Time 6   Period Weeks   Status Achieved   PT LONG TERM GOAL #5   Title improve FOTO =/< 37% limited ( 08/08/15   Time 6   Period Weeks   Status On-going               Plan - 07/14/15 0856    Clinical Impression Statement Abdiaziz continues to improve with decreased pain and improved cervical mobility. Posture is gradually improving; pt has increased cervical ROM; increased strength bilat UE's; decresaed pain during the day. [place] has responded well to TDN and manual work. He continues to have pain and limited cervical ROM with Rt rotation; muscular tightness to palpation through the Rt cervical and thoracic musculature. [place] will  benefit form continued PT to progress toward goals of therapy.    Pt will benefit from skilled therapeutic intervention in order to improve on the following deficits Postural dysfunction;Decreased strength;Pain;Decreased range of motion;Increased muscle spasms   Rehab Potential  Excellent   PT Frequency 2x / week   PT Duration 6 weeks   PT Treatment/Interventions Manual techniques;Therapeutic exercise;Moist Heat;Dry needling;Cryotherapy;Electrical Stimulation;Patient/family education;Passive range of motion;Ultrasound;Traction   PT Next Visit Plan continue with dry needling; stretching pecs; manual work; posterior shoulder girdle strengthening   PT Home Exercise Plan HEP; myofacial release work; postural correction    Consulted and Agree with Plan of Care Patient        Problem List Patient Active Problem List   Diagnosis Date Noted  . Neck injury 06/22/2015  . Hearing loss in right ear 10/14/2011  . Agent orange exposure 10/14/2011  . DM (diabetes mellitus) ([place]) 10/14/2011  . ADD (attention deficit disorder) 10/14/2011  . Hyperlipidemia 10/14/2011  . BMI 28.0-28.9,adult 10/14/2011    Lasheika Ortloff Nilda Simmer PT, MPH  07/14/2015, 9:10 AM  [facility] [place], [place], [address] Phone: [phone]   Fax:  [phone]  Name: KYRON SCHLITT MRN: [id number] Date of Birth: [birth year]

## 2015-07-14 NOTE — Patient Instructions (Signed)
Use ball for massage through the pecs/anterior chest   Resisted External Rotation: in Neutral - Bilateral   PALMS UP Can use noodle along spine to work on posture Sit or stand, tubing in both hands, elbows at sides, bent to 90, forearms forward. Pinch shoulder blades together and rotate forearms out. Keep elbows at sides. Repeat __10__ times per set. Do _2-3___ sets per session. Do _2-3___ sessions per day.   Low Row: Standing   Face anchor, feet shoulder width apart. Palms up, pull arms back, squeezing shoulder blades together. Repeat 10__ times per set. Do 2-3__ sets per session. Do 2-3__ sessions per week. Anchor Height: Waist     Strengthening: Resisted Extension   Hold tubing in right hand, arm forward. Pull arm back, elbow straight. Repeat _10___ times per set. Do 2-3____ sets per session. Do 2-3____ sessions per day.

## 2015-07-18 ENCOUNTER — Encounter: Payer: Self-pay | Admitting: Rehabilitative and Restorative Service Providers"

## 2015-07-18 ENCOUNTER — Ambulatory Visit (INDEPENDENT_AMBULATORY_CARE_PROVIDER_SITE_OTHER): Payer: BC Managed Care – PPO | Admitting: Rehabilitative and Restorative Service Providers"

## 2015-07-18 DIAGNOSIS — R293 Abnormal posture: Secondary | ICD-10-CM | POA: Diagnosis not present

## 2015-07-18 DIAGNOSIS — R29898 Other symptoms and signs involving the musculoskeletal system: Secondary | ICD-10-CM | POA: Diagnosis not present

## 2015-07-18 DIAGNOSIS — M542 Cervicalgia: Secondary | ICD-10-CM

## 2015-07-18 DIAGNOSIS — M436 Torticollis: Secondary | ICD-10-CM | POA: Diagnosis not present

## 2015-07-18 NOTE — Therapy (Signed)
[facility] [place], [place], [address] Phone: [phone]   Fax:  [phone]  Physical Therapy Treatment  Patient Details  Name: Jeff [name] MRN: [id number] Date of Birth: 1950/09/08 Referring Provider: Dr Barbaraann Barthel  Encounter Date: 07/18/2015      PT End of Session - 07/18/15 0803    Visit Number 7   Number of Visits 12   Date for PT Re-Evaluation 08/08/15   PT Start Time 0802   Activity Tolerance Patient tolerated treatment well      Past Medical History  Diagnosis Date  . Diabetes mellitus   . Hypertension   . Hyperlipidemia   . ADHD (attention deficit hyperactivity disorder)     Past Surgical History  Procedure Laterality Date  . Neck surgery    . Spine surgery      Cervical spine x 2; Hardin Negus; Critzer.    There were no vitals filed for this visit.  Visit Diagnosis:  Pain, neck  Abnormal posture  Stiffness of neck  Weakness of both arms      Subjective Assessment - 07/18/15 0804    Subjective Patient reports that his neck is feeling OK - still feels a dull cramp in his neck with turning his head to the Rt    Currently in Pain? Yes   Pain Score 2    Pain Location Neck   Pain Orientation Right   Pain Descriptors / Indicators Dull;Tightness  'crick" in his neck   Pain Type Chronic pain   Pain Onset More than a month ago   Pain Frequency Intermittent   Aggravating Factors  turning head to the Rt; sore when he awakens in the morning   Pain Relieving Factors dry needling; manual work; heat; hot shower                          [facility] Adult PT Treatment/Exercise - 07/18/15 0001    Neck Exercises: Machines for Strengthening   UBE (Upper Arm Bike) L3x4' alt FWD/BWD   Neck Exercises: Theraband   Scapula Retraction 10 reps;Red   Scapula Retraction Limitations with noodle along spine    Shoulder Extension 10 reps;Red   Rows 10 reps;Red   Neck Exercises: Standing   Neck Retraction 5 reps;10 secs   Other Standing Exercises shoulder shrugs/rolls x 10    Other Standing Exercises doorway stretch for pecs 3 positions 30 sec hold x 3 each verbal and tactile cuesfor form    Neck Exercises: Supine   Cervical Isometrics 10 reps  into pillow, then with tongue press to roof of mouth.    Other Supine Exercise 10 reps, red band supine scap stab ex per HEP - yellow band instead of red d/t pain    modified position with hips/knees bent more d/t LBP   Modalities   Modalities Electrical Stimulation;Moist Heat   Moist Heat Therapy   Number Minutes Moist Heat 15 Minutes   Moist Heat Location Cervical   Electrical Stimulation   Electrical Stimulation Location cervical; leveator; origin or upper trap; pecs Rt    Printmaker Action IFC   Electrical Stimulation Parameters to tolerance   Electrical Stimulation Goals Tone;Pain   Manual Therapy   Manual Therapy Soft tissue mobilization;Manual Traction   Manual therapy comments pt supine    Soft tissue mobilization bilat upper trap, peri occiput; pecs   Myofascial Release upper traps/chest   Passive ROM passive pec stretch standing with  back at noodle PT pushing through ant shoulders    Manual Traction cervical traction                 PT Education - 07/18/15 0814    Education provided Yes   Education Details encouraged cont work on posture; HEP    Person(s) Educated Patient   Methods Explanation   Comprehension Verbalized understanding             PT Long Term Goals - 07/14/15 0908    PT LONG TERM GOAL #1   Title I with advanced HEP ( 08/08/15)    Time 6   Period Weeks   Status On-going   PT LONG TERM GOAL #2   Title increase cervical rotation =/> 70 degrees bilat ( 08/08/15)    Baseline accomplished with Lt rotation   Time 6   Period Weeks   Status Partially Met   PT LONG TERM GOAL #3   Title demo shoulder strength =/> 5-/5 through out with out pain to allow him to play with his  grandsons( 08/08/15)    Time 6   Period Weeks   Status Achieved   PT LONG TERM GOAL #4   Title report overall pain decrease =/> 75% to allow him to return to work ( 08/08/15)    Time 6   Period Weeks   Status Achieved   PT LONG TERM GOAL #5   Title improve FOTO =/< 37% limited ( 08/08/15   Time 6   Period Weeks   Status On-going               Plan - 07/18/15 0815    Clinical Impression Statement Travaris reports continued improvement. He still has difficulty turning to the right but does not have the same amount of discomfort he did when he started PT. He no longer has the afternoon tightness and discomfort he did have. He continues to have the stiffness and discomfort in the morning. Vane will benefit form continued treatment to address persistent symptoms. He is responding well to TDN.  Very tender; tight; sensitive to palpation and manual work through the anterior cervical area.    Pt will benefit from skilled therapeutic intervention in order to improve on the following deficits Postural dysfunction;Decreased strength;Pain;Decreased range of motion;Increased muscle spasms   Rehab Potential Excellent   PT Frequency 2x / week   PT Duration 6 weeks   PT Treatment/Interventions Manual techniques;Therapeutic exercise;Moist Heat;Dry needling;Cryotherapy;Electrical Stimulation;Patient/family education;Passive range of motion;Ultrasound;Traction   PT Next Visit Plan continue with dry needling; stretching pecs; manual work; posterior shoulder girdle strengthening; address the anterior cervical tightness    PT Home Exercise Plan HEP; myofacial release work; postural correction    Consulted and Agree with Plan of Care Patient        Problem List Patient Active Problem List   Diagnosis Date Noted  . Neck injury 06/22/2015  . Hearing loss in right ear 10/14/2011  . Agent orange exposure 10/14/2011  . DM (diabetes mellitus) ([place]) 10/14/2011  . ADD (attention deficit disorder) 10/14/2011  .  Hyperlipidemia 10/14/2011  . BMI 28.0-28.9,adult 10/14/2011    Conni Knighton Nilda Simmer PT, MPH  07/18/2015, 8:45 AM  [facility] [place], [place], [address] Phone: [phone]   Fax:  [phone]  Name: KYAL ARTS MRN: [id number] Date of Birth: 20-Oct-1950

## 2015-07-20 ENCOUNTER — Ambulatory Visit (INDEPENDENT_AMBULATORY_CARE_PROVIDER_SITE_OTHER): Payer: BC Managed Care – PPO | Admitting: Physical Therapy

## 2015-07-20 ENCOUNTER — Encounter: Payer: Self-pay | Admitting: Physical Therapy

## 2015-07-20 DIAGNOSIS — R29898 Other symptoms and signs involving the musculoskeletal system: Secondary | ICD-10-CM | POA: Diagnosis not present

## 2015-07-20 DIAGNOSIS — R293 Abnormal posture: Secondary | ICD-10-CM | POA: Diagnosis not present

## 2015-07-20 DIAGNOSIS — M436 Torticollis: Secondary | ICD-10-CM | POA: Diagnosis not present

## 2015-07-20 DIAGNOSIS — M542 Cervicalgia: Secondary | ICD-10-CM

## 2015-07-20 NOTE — Therapy (Addendum)
Jeff [name] [place], [place], [address] Phone: [phone]   Fax:  [phone]  Physical Therapy Treatment  Patient Details  Name: Jeff [name] MRN: [id number] Date of Birth: 01-Jun-1951 Referring Provider: Dr Barbaraann Barthel  Encounter Date: 07/20/2015      PT End of Session - 07/20/15 0813    Visit Number 8   Number of Visits 12   Date for PT Re-Evaluation 08/08/15   PT Start Time 0809   PT Stop Time 0848   PT Time Calculation (min) 39 min   Activity Tolerance Patient limited by pain      Past Medical History  Diagnosis Date  . Diabetes mellitus   . Hypertension   . Hyperlipidemia   . ADHD (attention deficit hyperactivity disorder)     Past Surgical History  Procedure Laterality Date  . Neck surgery    . Spine surgery      Cervical spine x 2; Hardin Negus; Critzer.    There were no vitals filed for this visit.  Visit Diagnosis:  Pain, neck  Abnormal posture  Stiffness of neck  Weakness of both arms      Subjective Assessment - 07/20/15 0811    Subjective Pt states he is not sure what has happened however his neck is very sore  and stiff today.  Was doing really well, until 4 this AM. Not sure if he slept wrong.    Pertinent History 2 cervical surgeries C5-7 fusion then ~ 2012 had a plate put in. 3 yrs ago he fell and loosened a screw. Has two more days of prednisone.    Currently in Pain? Yes   Pain Score 4    Pain Location Neck   Pain Orientation Left;Right   Pain Descriptors / Indicators Tightness   Pain Type Chronic pain   Aggravating Factors  not sure, woke up with it today   Pain Relieving Factors dry needling, manual therapy            [facility] PT Assessment - 07/20/15 0001    Assessment   Medical Diagnosis Neck injury, cervical radiculopathy   Referring Provider Dr Barbaraann Barthel   Onset Date/Surgical Date 05/27/15   Hand Dominance Right   Next MD Visit 07/22/15   AROM   AROM Assessment  Site Cervical;Shoulder   Right/Left Shoulder Right;Left   Right Shoulder Flexion 150 Degrees   Left Shoulder Flexion 155 Degrees   Cervical Flexion 44  chin to chest.    Cervical Extension 40   Cervical - Right Rotation 57   Cervical - Left Rotation 70                     [facility] Adult PT Treatment/Exercise - 07/20/15 0001    Neck Exercises: Standing   Other Standing Exercises shoulder shrugs and circles   Other Standing Exercises side bend stretching   Modalities   Modalities --  will do at home today.    Manual Therapy   Manual Therapy Joint mobilization;Soft tissue mobilization;Manual Traction   Joint Mobilization grade II mobs CPA and Rt UPA at C 3-5    Soft tissue mobilization bilat upper trap, peri occiput; pecs   Myofascial Release occipital base release   Passive ROM cervical sidebend/rotation   Manual Traction cervical traction           Trigger Point Dry Needling - 07/20/15 0816    Consent Given? Yes   Education Handout Provided No   Muscles  Treated Upper Body Upper trapezius;Oblique capitus;Suboccipitals muscle group   Upper Trapezius Response Twitch reponse elicited   Oblique Capitus Response Palpable increased muscle length  bilat   SubOccipitals Response Palpable increased muscle length  bilat                   PT Long Term Goals - 07/20/15 0815    PT LONG TERM GOAL #1   Title I with advanced HEP ( 08/08/15)    Status On-going   PT LONG TERM GOAL #2   Title increase cervical rotation =/> 70 degrees bilat ( 08/08/15)    Status Partially Met   PT LONG TERM GOAL #3   Title demo shoulder strength =/> 5-/5 through out with out pain to allow him to play with his grandsons( 08/08/15)    Status Achieved   PT LONG TERM GOAL #4   Title report overall pain decrease =/> 75% to allow him to return to work ( 08/08/15)    Status Achieved   PT LONG TERM GOAL #5   Title improve FOTO =/< 37% limited ( 08/08/15   Status On-going                Plan - 07/20/15 1547    Clinical Impression Statement [name] had been doing very well until today,  He awoke with increased stiffness in his neck along with pain.  He does respond well to gentle ROM, stetching of the shoulders and head.  He did not respond well to trigger point dry needling today like he had in the past.  He did do well with occipital base release, manual cervical traciton and soft tissue work.  He has met some of his goal and progressing to the others.    Pt will benefit from skilled therapeutic intervention in order to improve on the following deficits Postural dysfunction;Decreased strength;Pain;Decreased range of motion;Increased muscle spasms   Rehab Potential Excellent   PT Frequency 2x / week   PT Duration 6 weeks   PT Treatment/Interventions Manual techniques;Therapeutic exercise;Moist Heat;Dry needling;Cryotherapy;Electrical Stimulation;Patient/family education;Passive range of motion;Ultrasound;Traction   PT Next Visit Plan continue with dry needling as indicated stretching pecs; manual work; posterior shoulder girdle strengthening; address the anterior cervical tightness    Consulted and Agree with Plan of Care Patient        Problem List Patient Active Problem List   Diagnosis Date Noted  . Neck injury 06/22/2015  . Hearing loss in right ear 10/14/2011  . Agent orange exposure 10/14/2011  . DM (diabetes mellitus) ([place]) 10/14/2011  . ADD (attention deficit disorder) 10/14/2011  . Hyperlipidemia 10/14/2011  . BMI 28.0-28.9,adult 10/14/2011    Jeral Pinch PT  07/20/2015, 3:53 PM  [facility] [place], [place], [address] Phone: [phone]   Fax:  [phone]  Name: Jeff [name] MRN: [id number] Date of Birth: Feb 07, 1951    PHYSICAL THERAPY DISCHARGE SUMMARY  Visits from Start of Care: 7  Current functional level related to goals / functional outcomes: Improved cervical mobility;  decreased cervical tightness; decreased pain ; improved functional activities   Remaining deficits: Some continued cervical tightness    Education / Equipment: HEP  Plan: Patient agrees to discharge.  Patient goals were partially met. Patient is being discharged due to being pleased with the current functional level.  ?????     Celyn P. Helene Kelp PT, MPH 08/17/2015 8:19 AM

## 2015-07-21 ENCOUNTER — Encounter: Payer: BC Managed Care – PPO | Admitting: Rehabilitative and Restorative Service Providers"

## 2015-07-21 ENCOUNTER — Telehealth: Payer: Self-pay | Admitting: Endocrinology

## 2015-07-21 NOTE — Telephone Encounter (Signed)
Pt has only 3 pumps left and he has found that both the vgos and the insulin is not being covered by the insurance, it needs to go thru the appeals. Pt is asking if maybe a different pump would be an option.

## 2015-07-22 ENCOUNTER — Other Ambulatory Visit: Payer: Self-pay | Admitting: Family Medicine

## 2015-07-22 ENCOUNTER — Encounter: Payer: Self-pay | Admitting: Family Medicine

## 2015-07-22 ENCOUNTER — Ambulatory Visit: Payer: BC Managed Care – PPO | Admitting: Endocrinology

## 2015-07-22 ENCOUNTER — Ambulatory Visit (INDEPENDENT_AMBULATORY_CARE_PROVIDER_SITE_OTHER): Payer: BC Managed Care – PPO | Admitting: Family Medicine

## 2015-07-22 VITALS — BP 131/89 | HR 73 | Ht 68.0 in | Wt 179.0 lb

## 2015-07-22 DIAGNOSIS — S199XXD Unspecified injury of neck, subsequent encounter: Secondary | ICD-10-CM | POA: Diagnosis not present

## 2015-07-22 MED ORDER — INSULIN GLARGINE 100 UNIT/ML SOLOSTAR PEN: 30.0000 [IU] | PEN_INJECTOR | SUBCUTANEOUS | Status: DC

## 2015-07-22 NOTE — Telephone Encounter (Signed)
When you finish the V-GO, change to "basaglar," 30 units each morning. Ov next week, with both me and Bonita Quin

## 2015-07-22 NOTE — Telephone Encounter (Signed)
See note below and please advise, Thanks! 

## 2015-07-22 NOTE — Telephone Encounter (Signed)
Requested a call back from the pt to discuss.  

## 2015-07-22 NOTE — Patient Instructions (Signed)
Continue with physical therapy and home exercises, TENS unit. Follow up with me in 6 weeks only if needed.

## 2015-07-22 NOTE — Telephone Encounter (Signed)
I contacted the pt and advised of note below. Pt voiced understanding and scheduled his appointment on 07/25/2015.

## 2015-07-25 ENCOUNTER — Ambulatory Visit (INDEPENDENT_AMBULATORY_CARE_PROVIDER_SITE_OTHER): Payer: BC Managed Care – PPO | Admitting: Endocrinology

## 2015-07-25 ENCOUNTER — Encounter: Payer: Self-pay | Admitting: Physical Therapy

## 2015-07-25 ENCOUNTER — Encounter: Payer: BC Managed Care – PPO | Admitting: Nutrition

## 2015-07-25 ENCOUNTER — Encounter: Payer: Self-pay | Admitting: Endocrinology

## 2015-07-25 VITALS — BP 118/70 | HR 80 | Temp 98.2°F | Ht 68.0 in | Wt 177.0 lb

## 2015-07-25 DIAGNOSIS — Z794 Long term (current) use of insulin: Secondary | ICD-10-CM

## 2015-07-25 DIAGNOSIS — N183 Chronic kidney disease, stage 3 (moderate): Secondary | ICD-10-CM

## 2015-07-25 DIAGNOSIS — E1122 Type 2 diabetes mellitus with diabetic chronic kidney disease: Secondary | ICD-10-CM

## 2015-07-25 DIAGNOSIS — E119 Type 2 diabetes mellitus without complications: Secondary | ICD-10-CM | POA: Insufficient documentation

## 2015-07-25 MED ORDER — V-GO 30 KIT: 1.0000 | PACK | Freq: Every day | Status: DC

## 2015-07-25 MED ORDER — GLUCOSE BLOOD VI STRP: 1.0000 | ORAL_STRIP | Freq: Four times a day (QID) | Status: DC

## 2015-07-25 NOTE — Progress Notes (Signed)
Subjective:    Patient ID: Jeff [name], male    DOB: [birth year], 65 y.o.   MRN: [id number]  HPI Pt returns for f/u of diabetes mellitus: DM type: Insulin-requiring type 2 Dx'ed: 2005 Complications: painful polyneuropathy and renal insufficiency.   Therapy: insulin since 2016 DKA: never Severe hypoglycemia: never Pancreatitis: never Other: he started V-GO-20 pump in early 2016.   Interval history: no recent steroids.  no cbg record, but states cbg's are in the 200's.  Pt says ins has declined V-GO.  He has lost weight, due to his efforts.   Past Medical History  Diagnosis Date  . Diabetes mellitus   . Hypertension   . Hyperlipidemia   . ADHD (attention deficit hyperactivity disorder)     Past Surgical History  Procedure Laterality Date  . Neck surgery    . Spine surgery      Cervical spine x 2; Vear Clock; Critzer.    Social History   Social History  . Marital Status: Single    Spouse Name: N/A  . Number of Children: N/A  . Years of Education: N/A   Occupational History  . Archivist    Social History Main Topics  . Smoking status: Never Smoker   . Smokeless tobacco: Not on file  . Alcohol Use: No  . Drug Use: No  . Sexual Activity: Not on file   Other Topics Concern  . Not on file   Social History Narrative    Current Outpatient Prescriptions on File Prior to Visit  Medication Sig Dispense Refill  . aspirin EC 81 MG tablet Take 1 tablet (81 mg total) by mouth daily. 90 tablet 11  . lisdexamfetamine (VYVANSE) 70 MG capsule Take 1 capsule (70 mg total) by mouth daily. Do not fill until 08/16/15. 30 capsule 0  . lisdexamfetamine (VYVANSE) 70 MG capsule Take 1 capsule (70 mg total) by mouth daily. Do not fill until 07/19/15 30 capsule 0  . lisdexamfetamine (VYVANSE) 70 MG capsule Take 1 capsule (70 mg total) by mouth daily. 30 capsule 0  . lisinopril (PRINIVIL,ZESTRIL) 10 MG tablet Take 1 tablet (10 mg total) by mouth daily. 90 tablet 1  .  meloxicam (MOBIC) 7.5 MG tablet Take 1 tablet (7.5 mg total) by mouth daily. PATIENT NEEDS OFFICE VISIT FOR ADDITIONAL REFILLS 30 tablet 0  . metFORMIN (GLUCOPHAGE) 1000 MG tablet Take 1 tablet (1,000 mg total) by mouth 2 (two) times daily with a meal. 180 tablet 1  . simvastatin (ZOCOR) 40 MG tablet TAKE 1 TABLET BY MOUTH DAILY AT 6:00 PM 90 tablet 1  . sitaGLIPtin (JANUVIA) 100 MG tablet Take 1 tablet (100 mg total) by mouth daily. 90 tablet 1  . traMADol-acetaminophen (ULTRACET) 37.5-325 MG tablet Take 1 tablet by mouth every 6 (six) hours as needed. 30 tablet 0  . BASAGLAR KWIKPEN 100 UNIT/ML Solostar Pen INJ 30 UNITS [place] QAM  11  . NOVOLOG 100 UNIT/ML injection      No current facility-administered medications on file prior to visit.    Allergies  Allergen Reactions  . Flexeril [Cyclobenzaprine Hcl] Other (See Comments)    Causes him to pass out and lowers his BP  . Lidocaine Swelling    throat  . Novocain [Procaine Hcl] Swelling    Family History  Problem Relation Age of Onset  . Heart disease Mother     CHF  . Diabetes Mother   . Heart disease Father   . Cancer Brother  BP 118/70 mmHg  Pulse 80  Temp(Src) 98.2 F (36.8 C) (Oral)  Ht  (1.727 m)  Wt 177 lb (80.287 kg)  BMI 26.92 kg/m2  SpO2 98%  Review of Systems He denies hypoglycemia    Objective:   Physical Exam VITAL SIGNS:  See vs page GENERAL: no distress SKIN:  Insulin infusion sites at the anterior abdomen are normal.        Assessment & Plan:  DM: he needs increased rx.  I discussed with Cristy Folks, CDE, who says she'll check wit insurance.    Patient is advised the following: Patient Instructions  please increase the V-GO to a 30-size.  We'll appeal to your insurance.   Continue 6 "clicks" 3 times a day (with each meal).  Each click is 2 units.    check your blood sugar twice a day.  vary the time of day when you check, between before the 3 meals, and at bedtime.  also check if you  have symptoms of your blood sugar being too high or too low.  please keep a record of the readings and bring it to your next appointment here.  You can write it on any piece of paper.  please call us sooner if your blood sugar goes below 70, or if you have a lot of readings over 200.  Please come back for a follow-up appointment in 1 month.

## 2015-07-25 NOTE — Patient Instructions (Addendum)
please increase the V-GO to a 30-size.  We'll appeal to your insurance.   Continue 6 "clicks" 3 times a day (with each meal).  Each click is 2 units.    check your blood sugar twice a day.  vary the time of day when you check, between before the 3 meals, and at bedtime.  also check if you have symptoms of your blood sugar being too high or too low.  please keep a record of the readings and bring it to your next appointment here.  You can write it on any piece of paper.  please call us sooner if your blood sugar goes below 70, or if you have a lot of readings over 200.  Please come back for a follow-up appointment in 1 month.

## 2015-07-25 NOTE — Assessment & Plan Note (Signed)
Prior radiographs, CT reassuring.  Exam improved - better motion, strength, decreased pain as well.  He will continue with physical therapy, home exercises, using TENS unit.  Will let him get a couple more visits of PT before returning to work.  F/u in 6 weeks if needed.

## 2015-07-25 NOTE — Progress Notes (Signed)
PCP: Tonye Pearson, MD Consultation requested by [name] PA-C  Subjective:   HPI: Patient is a 65 y.o. male here for neck pain.  1/17: Patient has prior history of cervical spine fusion C5-7 with known loosening of C7 screw. Reports no issues recently until he was getting up to use the restroom a few weeks ago. He went to sit on edge of bed and fell off striking stool next to a dressing table with base of his skull, cervical spine. Immediate pain, continued throbbing posterior skull with some pain into both shoulders. Pain is achy with some sharp components to it. Pain level 5/10. No radiation into extremities though. No numbness/tingling. No bowel/bladder dysfunction. Advised to avoid aleve, ibuprofen due to mildly elevated creatinine. Had cervical spine radiographs 12/29 with possible C1 fracture - follow up CT that day without evidence acute fracture and CT head was normal.  2/17: Patient reports he has improved since last visit. Pain level down to 2/10 level. Doing physical therapy, home exercises, dry needling, home TENS unit twice a day. Not taking any medicine now for this. No skin changes, fever, other complaints.  Past Medical History  Diagnosis Date  . Diabetes mellitus   . Hypertension   . Hyperlipidemia   . ADHD (attention deficit hyperactivity disorder)     Current Outpatient Prescriptions on File Prior to Visit  Medication Sig Dispense Refill  . aspirin EC 81 MG tablet Take 1 tablet (81 mg total) by mouth daily. 90 tablet 11  . lisdexamfetamine (VYVANSE) 70 MG capsule Take 1 capsule (70 mg total) by mouth daily. Do not fill until 08/16/15. 30 capsule 0  . lisdexamfetamine (VYVANSE) 70 MG capsule Take 1 capsule (70 mg total) by mouth daily. Do not fill until 07/19/15 30 capsule 0  . lisdexamfetamine (VYVANSE) 70 MG capsule Take 1 capsule (70 mg total) by mouth daily. 30 capsule 0  . lisinopril (PRINIVIL,ZESTRIL) 10 MG tablet Take 1 tablet (10 mg total)  by mouth daily. 90 tablet 1  . meloxicam (MOBIC) 7.5 MG tablet Take 1 tablet (7.5 mg total) by mouth daily. PATIENT NEEDS OFFICE VISIT FOR ADDITIONAL REFILLS 30 tablet 0  . metFORMIN (GLUCOPHAGE) 1000 MG tablet Take 1 tablet (1,000 mg total) by mouth 2 (two) times daily with a meal. 180 tablet 1  . sitaGLIPtin (JANUVIA) 100 MG tablet Take 1 tablet (100 mg total) by mouth daily. 90 tablet 1  . traMADol-acetaminophen (ULTRACET) 37.5-325 MG tablet Take 1 tablet by mouth every 6 (six) hours as needed. 30 tablet 0   No current facility-administered medications on file prior to visit.    Past Surgical History  Procedure Laterality Date  . Neck surgery    . Spine surgery      Cervical spine x 2; Vear Clock; Critzer.    Allergies  Allergen Reactions  . Flexeril [Cyclobenzaprine Hcl] Other (See Comments)    Causes him to pass out and lowers his BP  . Lidocaine Swelling    throat  . Novocain [Procaine Hcl] Swelling    Social History   Social History  . Marital Status: Single    Spouse Name: N/A  . Number of Children: N/A  . Years of Education: N/A   Occupational History  . Archivist    Social History Main Topics  . Smoking status: Never Smoker   . Smokeless tobacco: Not on file  . Alcohol Use: No  . Drug Use: No  . Sexual Activity: Not on file  Other Topics Concern  . Not on file   Social History Narrative    Family History  Problem Relation Age of Onset  . Heart disease Mother     CHF  . Diabetes Mother   . Heart disease Father   . Cancer Brother     BP 131/89 mmHg  Pulse 73  Ht  (1.727 m)  Wt 179 lb (81.194 kg)  BMI 27.22 kg/m2  Review of Systems: See HPI above.    Objective:  Physical Exam:  Gen: NAD, comfortable in exam room.  Neck: No gross deformity, swelling, bruising. TTP R > L paraspinal cervical regions.  No bony tenderness. ROM 5 degrees extension, 30 degrees right lateral rotation, full left, full flexion. BUE  strength 5/5 Sensation intact to light touch.   2+ reflexes in triceps, biceps, brachioradialis tendons on left.  2+ right biceps, triceps but trace brachioradialis on right. Negative spurlings.    Assessment & Plan:  1. Neck injury - Prior radiographs, CT reassuring.  Exam improved - better motion, strength, decreased pain as well.  He will continue with physical therapy, home exercises, using TENS unit.  Will let him get a couple more visits of PT before returning to work.  F/u in 6 weeks if needed.

## 2015-07-27 ENCOUNTER — Encounter: Payer: BC Managed Care – PPO | Admitting: Physical Therapy

## 2015-07-28 ENCOUNTER — Telehealth: Payer: Self-pay | Admitting: Nutrition

## 2015-07-28 NOTE — Telephone Encounter (Signed)
Pt said that the VGo number he was given on his card seems to be the wrong number, he just keeps getting a busy tone.

## 2015-07-29 NOTE — Telephone Encounter (Signed)
Pt also said that the VGo is too expensive for him and would like to know if there is another alternative.

## 2015-08-12 ENCOUNTER — Telehealth: Payer: Self-pay

## 2015-08-12 NOTE — Telephone Encounter (Signed)
Pt is needing prior authorization for vyvance and pt still has the rx  Best number [phone]

## 2015-08-13 NOTE — Telephone Encounter (Signed)
PT CALLED AGAIN TO FOLLOW-UP ON HIS PREVIOUS MESSAGE; HE IS REQUESTING AN AUTHORIZATION FOR VYVANCE.

## 2015-08-17 ENCOUNTER — Encounter: Payer: Self-pay | Admitting: Internal Medicine

## 2015-08-17 ENCOUNTER — Ambulatory Visit (INDEPENDENT_AMBULATORY_CARE_PROVIDER_SITE_OTHER): Payer: BC Managed Care – PPO | Admitting: Internal Medicine

## 2015-08-17 VITALS — BP 125/76 | HR 76 | Temp 98.1°F | Resp 16 | Ht 68.0 in | Wt 181.0 lb

## 2015-08-17 DIAGNOSIS — N183 Chronic kidney disease, stage 3 unspecified: Secondary | ICD-10-CM

## 2015-08-17 DIAGNOSIS — Z1211 Encounter for screening for malignant neoplasm of colon: Secondary | ICD-10-CM | POA: Diagnosis not present

## 2015-08-17 DIAGNOSIS — F909 Attention-deficit hyperactivity disorder, unspecified type: Secondary | ICD-10-CM

## 2015-08-17 DIAGNOSIS — E871 Hypo-osmolality and hyponatremia: Secondary | ICD-10-CM

## 2015-08-17 DIAGNOSIS — Z794 Long term (current) use of insulin: Secondary | ICD-10-CM | POA: Diagnosis not present

## 2015-08-17 DIAGNOSIS — E1122 Type 2 diabetes mellitus with diabetic chronic kidney disease: Secondary | ICD-10-CM | POA: Diagnosis not present

## 2015-08-17 DIAGNOSIS — Z125 Encounter for screening for malignant neoplasm of prostate: Secondary | ICD-10-CM | POA: Diagnosis not present

## 2015-08-17 DIAGNOSIS — F988 Other specified behavioral and emotional disorders with onset usually occurring in childhood and adolescence: Secondary | ICD-10-CM

## 2015-08-17 DIAGNOSIS — E785 Hyperlipidemia, unspecified: Secondary | ICD-10-CM | POA: Diagnosis not present

## 2015-08-17 DIAGNOSIS — Z0000 Encounter for general adult medical examination without abnormal findings: Secondary | ICD-10-CM

## 2015-08-17 DIAGNOSIS — S199XXS Unspecified injury of neck, sequela: Secondary | ICD-10-CM

## 2015-08-17 LAB — COMPREHENSIVE METABOLIC PANEL
ALBUMIN: 4.8 g/dL (ref 3.6–5.1)
ALT: 19 U/L (ref 9–46)
AST: 18 U/L (ref 10–35)
Alkaline Phosphatase: 59 U/L (ref 40–115)
BUN: 18 mg/dL (ref 7–25)
CHLORIDE: 102 mmol/L (ref 98–110)
CO2: 26 mmol/L (ref 20–31)
CREATININE: 1.3 mg/dL — AB (ref 0.70–1.25)
Calcium: 10.2 mg/dL (ref 8.6–10.3)
Glucose, Bld: 89 mg/dL (ref 65–99)
POTASSIUM: 4.3 mmol/L (ref 3.5–5.3)
SODIUM: 139 mmol/L (ref 135–146)
Total Bilirubin: 0.5 mg/dL (ref 0.2–1.2)
Total Protein: 7.3 g/dL (ref 6.1–8.1)

## 2015-08-17 LAB — LIPID PANEL
CHOL/HDL RATIO: 3.7 ratio (ref <=5.0)
CHOLESTEROL: 176 mg/dL (ref 125–200)
HDL: 47 mg/dL (ref >=40)
LDL Cholesterol: 96 mg/dL (ref <130)
TRIGLYCERIDES: 165 mg/dL — AB (ref <150)
VLDL: 33 mg/dL — ABNORMAL HIGH (ref <30)

## 2015-08-17 LAB — CBC WITH DIFFERENTIAL/PLATELET
BASOS PCT: 1 % (ref 0–1)
Basophils Absolute: 0.1 10*3/uL (ref 0.0–0.1)
EOS ABS: 0.3 10*3/uL (ref 0.0–0.7)
Eosinophils Relative: 3 % (ref 0–5)
HCT: 42.3 % (ref 39.0–52.0)
HEMOGLOBIN: 14.7 g/dL (ref 13.0–17.0)
LYMPHS ABS: 2.2 10*3/uL (ref 0.7–4.0)
Lymphocytes Relative: 25 % (ref 12–46)
MCH: 29.3 pg (ref 26.0–34.0)
MCHC: 34.8 g/dL (ref 30.0–36.0)
MCV: 84.3 fL (ref 78.0–100.0)
MONO ABS: 0.7 10*3/uL (ref 0.1–1.0)
MONOS PCT: 8 % (ref 3–12)
MPV: 10.9 fL (ref 8.6–12.4)
Neutro Abs: 5.5 10*3/uL (ref 1.7–7.7)
Neutrophils Relative %: 63 % (ref 43–77)
Platelets: 230 10*3/uL (ref 150–400)
RBC: 5.02 MIL/uL (ref 4.22–5.81)
RDW: 13.5 % (ref 11.5–15.5)
WBC: 8.7 10*3/uL (ref 4.0–10.5)

## 2015-08-17 NOTE — Telephone Encounter (Signed)
PA completed on covermymeds. Pending. 

## 2015-08-17 NOTE — Patient Instructions (Addendum)
Whole 30 diet   Because you received labwork today, you will receive an invoice from [facility]. Please contact [facility] at [phone] with questions or concerns regarding your invoice. Our billing staff will not be able to assist you with those questions.  You will be contacted with the lab results as soon as they are available. The fastest way to get your results is to activate your My Chart account. Instructions are located on the last page of this paperwork. If you have not heard from us regarding the results in 2 weeks, please contact this office.

## 2015-08-18 LAB — PSA: PSA: 0.69 ng/mL (ref <=4.00)

## 2015-08-19 ENCOUNTER — Other Ambulatory Visit: Payer: Self-pay | Admitting: Family Medicine

## 2015-08-19 MED ORDER — LISINOPRIL 10 MG PO TABS: 10.0000 mg | ORAL_TABLET | Freq: Every day | ORAL | Status: DC

## 2015-08-19 MED ORDER — METFORMIN HCL 1000 MG PO TABS: 1000.0000 mg | ORAL_TABLET | Freq: Two times a day (BID) | ORAL | Status: DC

## 2015-08-19 NOTE — Progress Notes (Signed)
Subjective:    Patient ID: Jeff [name], male    DOB: [birth year], 65 y.o.   MRN: [id number]  HPIhere for annual CPE and f/u for med probs Patient Active Problem List   Diagnosis Date Noted  . Diabetes ([place]) 07/25/2015  . Neck injury 06/22/2015  . Hearing loss in right ear 10/14/2011  . Agent orange exposure 10/14/2011  . ADD (attention deficit disorder) 10/14/2011  . Hyperlipidemia 10/14/2011  . BMI 28.0-28.9,adult 10/14/2011    Current outpatient prescriptions:  .  aspirin EC 81 MG tablet, Take 1 tablet (81 mg total) by mouth daily., Disp: 90 tablet, Rfl: 11 .  BASAGLAR KWIKPEN 100 UNIT/ML Solostar Pen, INJ 30 UNITS [place] QAM, Disp: , Rfl: 11 .  glucose blood (ACCU-CHEK COMPACT TEST DRUM) test strip, 1 each by Other route 4 (four) times daily. And lancets 4/day, Disp: 360 each, Rfl: 3 .  Insulin Disposable Pump (V-GO 30) KIT, 1 Device by Does not apply route daily., Disp: 30 kit, Rfl: 11 .  lisinopril (PRINIVIL,ZESTRIL) 10 MG tablet, Take 1 tablet (10 mg total) by mouth daily., Disp: 90 tablet, Rfl: 1 .  meloxicam (MOBIC) 7.5 MG tablet, Take 1 tablet (7.5 mg total) by mouth daily. PATIENT NEEDS OFFICE VISIT FOR ADDITIONAL REFILLS, Disp: 30 tablet, Rfl: 0 .  metFORMIN (GLUCOPHAGE) 1000 MG tablet, Take 1 tablet (1,000 mg total) by mouth 2 (two) times daily with a meal., Disp: 180 tablet, Rfl: 1 .  NOVOLOG 100 UNIT/ML injection, , Disp: , Rfl:  .  simvastatin (ZOCOR) 40 MG tablet, TAKE 1 TABLET BY MOUTH DAILY AT 6:00 PM, Disp: 90 tablet, Rfl: 1 .  sitaGLIPtin (JANUVIA) 100 MG tablet, Take 1 tablet (100 mg total) by mouth daily., Disp: 90 tablet, Rfl: 1 .  traMADol-acetaminophen (ULTRACET) 37.5-325 MG tablet, Take 1 tablet by mouth every 6 (six) hours as needed., Disp: 30 tablet, Rfl: 0 .  lisdexamfetamine (VYVANSE) 70 MG capsule, Take 1 capsule (70 mg total) by mouth daily  Dr Loanne Drilling DM PUMP discouraged that not better so far  See neck injury-not well- still PT  Not on vyvanse due to  insurance requiring preauthorization once again and having delayed this over the last 8 weeks through a pharmacy problem. 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 He appears well-developed and well-nourished.  HENT:  Head: Normocephalic and atraumatic.  Right Ear: Hearing, tympanic membrane, external ear and ear canal normal.  Left Ear: Hearing, tympanic membrane, external ear and ear canal normal.  Nose: Nose normal.  Mouth/Throat: Uvula is midline, oropharynx is clear and moist and mucous membranes are normal.  Eyes: Conjunctivae, EOM and lids are normal. Pupils are equal, round, and reactive to light. Right eye exhibits no discharge. Left eye exhibits no discharge. No scleral icterus.  Neck: Trachea normal and normal range of motion. Neck supple. Carotid bruit is not present.  Cardiovascular: Normal rate, regular rhythm, normal heart sounds, intact distal pulses and normal pulses.   No murmur heard. Pulmonary/Chest: Effort normal and breath sounds normal. No respiratory distress. He has no wheezes. He has no rhonchi. He has no rales.  Abdominal: Soft. Normal appearance and bowel sounds are normal. He exhibits no abdominal bruit. There is no tenderness.  Musculoskeletal: Normal range of motion. He exhibits no edema or tenderness.  Lymphadenopathy:       Head (right side): No submental, no submandibular, no tonsillar, no preauricular, no posterior auricular and no occipital adenopathy present.       Head (left side): No submental, no submandibular, no tonsillar, no preauricular, no posterior auricular and no occipital adenopathy present.    He has no  cervical adenopathy.  Neurological: He is alert and oriented to person, place, and time. He has normal strength and normal reflexes. No cranial nerve deficit or sensory deficit. Coordination and gait normal.  Skin: Skin is warm, dry and intact. No lesion and no rash noted.  Psychiatric: He has a normal mood and affect. His speech is normal and behavior is normal. Judgment and thought content normal.   BP 125/76 mmHg  Pulse 76  Temp(Src) 98.1 F (36.7 C)  Resp 16  Ht 5' 8" (1.727 m)  Wt 181 lb (82.101 kg)  BMI 27.53 kg/m2     Assessment & Plan:  Annual physical exam  ADD (attention deficit disorder)  Hyperlipidemia - Plan: Lipid panel  Type 2 diabetes mellitus with stage 3 chronic kidney disease, with long-term current use of insulin (HCC) - Plan: CBC with Differential/Platelet, Comprehensive metabolic panel  Special screening for malignant neoplasms, colon - Plan: Ambulatory referral to Gastroenterology  Hyponatremia - Plan: Comprehensive metabolic panel  Screening for prostate cancer - Plan: PSA  Neck injury, sequela  Meds ordered this encounter  Medications  . metFORMIN (GLUCOPHAGE) 1000 MG tablet    Sig: Take 1 tablet (1,000 mg total) by mouth 2 (two) times daily with a meal.    Dispense:  180 tablet    Refill:  1  . lisinopril (PRINIVIL,ZESTRIL) 10 MG tablet    Sig: Take 1 tablet (10 mg total) by mouth daily.    Dispense:  90 tablet    Refill:  1   Continue other medications which he has currently/next follow-up here in 6 months He has Vyvanse prescriptions for 3 months but has been unable to fill them--we will search again for prior authorization

## 2015-08-22 ENCOUNTER — Other Ambulatory Visit: Payer: Self-pay | Admitting: Internal Medicine

## 2015-08-22 NOTE — Telephone Encounter (Signed)
Fax advised that PA is good through 08/17/2018.

## 2015-08-22 NOTE — Telephone Encounter (Signed)
Checked on PA and saw in covermymeds that it was approved. Notified pt.

## 2015-08-22 NOTE — Addendum Note (Signed)
Addended by: Sheppard PlumberBRIGGS, Philopateer Strine A on: 08/22/2015 02:04 PM   Modules accepted: Kipp BroodSmartSet

## 2015-08-22 NOTE — Progress Notes (Signed)
I checked on pending PA on covermymeds and the PA was approved. Notified pt.

## 2015-08-24 NOTE — Telephone Encounter (Signed)
Dr. Merla Richesoolittle can we refill Mobic?

## 2015-08-26 ENCOUNTER — Encounter: Payer: Self-pay | Admitting: Endocrinology

## 2015-08-26 ENCOUNTER — Ambulatory Visit (INDEPENDENT_AMBULATORY_CARE_PROVIDER_SITE_OTHER): Payer: BC Managed Care – PPO | Admitting: Endocrinology

## 2015-08-26 VITALS — BP 118/70 | HR 92 | Temp 97.5°F | Ht 68.0 in | Wt 180.0 lb

## 2015-08-26 DIAGNOSIS — E1122 Type 2 diabetes mellitus with diabetic chronic kidney disease: Secondary | ICD-10-CM

## 2015-08-26 DIAGNOSIS — N183 Chronic kidney disease, stage 3 (moderate): Secondary | ICD-10-CM | POA: Diagnosis not present

## 2015-08-26 DIAGNOSIS — Z794 Long term (current) use of insulin: Secondary | ICD-10-CM

## 2015-08-26 LAB — POCT GLYCOSYLATED HEMOGLOBIN (HGB A1C): Hemoglobin A1C: 9.2

## 2015-08-26 MED ORDER — V-GO 40 KIT
1.0000 | PACK | Freq: Every day | Status: DC
Start: 2015-08-26 — End: 2016-08-13

## 2015-08-26 MED ORDER — DULAGLUTIDE 1.5 MG/0.5ML ~~LOC~~ SOAJ
1.5000 mg | SUBCUTANEOUS | Status: DC
Start: 2015-08-26 — End: 2016-09-07

## 2015-08-26 NOTE — Patient Instructions (Addendum)
please increase the V-GO to a 40-size.   Continue 6 "clicks" 3 times a day (with each meal).  Each click is 2 units.   I have also sent a prescription to change januvia to "trulicity."  check your blood sugar twice a day.  vary the time of day when you check, between before the 3 meals, and at bedtime.  also check if you have symptoms of your blood sugar being too high or too low.  please keep a record of the readings and bring it to your next appointment here.  You can write it on any piece of paper.  please call us sooner if your blood sugar goes below 70, or if you have a lot of readings over 200.  Please come back for a follow-up appointment in 2 months.

## 2015-08-26 NOTE — Progress Notes (Signed)
Subjective:    Patient ID: Jeff [name], male    DOB: [birth year], 65 y.o.   MRN: [id number]  HPI Pt returns for f/u of diabetes mellitus: DM type: Insulin-requiring type 2 Dx'ed: 2005 Complications: painful polyneuropathy and renal insufficiency.  Therapy: insulin since 2016 DKA: never Severe hypoglycemia: never.  Pancreatitis: never Other: he started V-GO-30 pump in early 2016.   Interval history: no recent steroids.  no cbg record, but states cbg's are still in the 200's, despite maximum (18) "clicks" per day.  He has lost weight, due to his efforts.  Past Medical History  Diagnosis Date  . Diabetes mellitus   . Hypertension   . Hyperlipidemia   . ADHD (attention deficit hyperactivity disorder)     Past Surgical History  Procedure Laterality Date  . Neck surgery    . Spine surgery      Cervical spine x 2; Vear Clock; [facility].    Social History   Social History  . Marital Status: Single    Spouse Name: N/A  . Number of Children: N/A  . Years of Education: N/A   Occupational History  . Archivist    Social History Main Topics  . Smoking status: Never Smoker   . Smokeless tobacco: Not on file  . Alcohol Use: No  . Drug Use: No  . Sexual Activity: Yes   Other Topics Concern  . Not on file   Social History Narrative    Current Outpatient Prescriptions on File Prior to Visit  Medication Sig Dispense Refill  . aspirin EC 81 MG tablet Take 1 tablet (81 mg total) by mouth daily. 90 tablet 11  . glucose blood (ACCU-CHEK COMPACT TEST DRUM) test strip 1 each by Other route 4 (four) times daily. And lancets 4/day 360 each 3  . lisdexamfetamine (VYVANSE) 70 MG capsule Take 1 capsule (70 mg total) by mouth daily. Do not fill until 08/16/15. 30 capsule 0  . lisdexamfetamine (VYVANSE) 70 MG capsule Take 1 capsule (70 mg total) by mouth daily. Do not fill until 07/19/15 30 capsule 0  . lisdexamfetamine (VYVANSE) 70 MG capsule Take 1 capsule (70 mg total)  by mouth daily. 30 capsule 0  . lisinopril (PRINIVIL,ZESTRIL) 10 MG tablet Take 1 tablet (10 mg total) by mouth daily. 90 tablet 1  . meloxicam (MOBIC) 7.5 MG tablet TAKE 1 TABLET(7.5 MG) BY MOUTH DAILY 30 tablet 0  . metFORMIN (GLUCOPHAGE) 1000 MG tablet Take 1 tablet (1,000 mg total) by mouth 2 (two) times daily with a meal. 180 tablet 1  . NOVOLOG 100 UNIT/ML injection     . simvastatin (ZOCOR) 40 MG tablet TAKE 1 TABLET BY MOUTH DAILY AT 6:00 PM 90 tablet 1  . traMADol-acetaminophen (ULTRACET) 37.5-325 MG tablet Take 1 tablet by mouth every 6 (six) hours as needed. 30 tablet 0   No current facility-administered medications on file prior to visit.    Allergies  Allergen Reactions  . Flexeril [Cyclobenzaprine Hcl] Other (See Comments)    Causes him to pass out and lowers his BP  . Lidocaine Swelling    throat  . Novocain [Procaine Hcl] Swelling    Family History  Problem Relation Age of Onset  . Heart disease Mother     CHF  . Diabetes Mother   . Heart disease Father   . Cancer Brother     BP 118/70 mmHg  Pulse 92  Temp(Src) 97.5 F (36.4 C) (Oral)  Ht  (1.727 m)  Wt 180 lb (81.647 kg)  BMI 27.38 kg/m2  SpO2 94%  Review of Systems He denies hypoglycemia.      Objective:   Physical Exam VITAL SIGNS:  See vs page GENERAL: no distress Pulses: dorsalis pedis intact bilat.   MSK: no deformity of the feet.  CV: no leg edema.  Skin:  no ulcer on the feet.  normal color and temp on the feet.  Neuro: sensation is intact to touch on the feet, but decreased from normal.    A1c=9.7%    Assessment & Plan:  DM: he needs increased rx.  His insulin requirement may be greater than can be accomidated by the V-GO device.   Patient is advised the following: Patient Instructions  please increase the V-GO to a 40-size.   Continue 6 "clicks" 3 times a day (with each meal).  Each click is 2 units.   I have also sent a prescription to change januvia to "trulicity."  check  your blood sugar twice a day.  vary the time of day when you check, between before the 3 meals, and at bedtime.  also check if you have symptoms of your blood sugar being too high or too low.  please keep a record of the readings and bring it to your next appointment here.  You can write it on any piece of paper.  please call us sooner if your blood sugar goes below 70, or if you have a lot of readings over 200.  Please come back for a follow-up appointment in 2 months.

## 2015-09-09 ENCOUNTER — Telehealth: Payer: Self-pay | Admitting: Endocrinology

## 2015-09-09 NOTE — Telephone Encounter (Signed)
He was using 2 30 pumps not one 40 please advise

## 2015-09-09 NOTE — Telephone Encounter (Signed)
Pt believes the vgo pump is causing trouble the double up is causing the BS to drop to 40-50s please advise

## 2015-09-09 NOTE — Telephone Encounter (Signed)
MD notified via separate telephone call. Waiting on response.

## 2015-09-09 NOTE — Telephone Encounter (Signed)
l °

## 2015-09-09 NOTE — Telephone Encounter (Signed)
See note below and please advise, Thanks! 

## 2015-09-09 NOTE — Telephone Encounter (Signed)
Ok, it looks like 1 of the V-GO 30's is good. Do you want me to change the prescription to the -30?

## 2015-09-09 NOTE — Telephone Encounter (Signed)
I contacted the pt. Pt stated he was using 2 30 v-go's for a 24 hrs period at the beginning of the week. On Tuesday he removed one to the v-go's and has been using 1 30 vgo for a 24 hour period with no clicks. Since removing the second vgo the pt's blood sugar has not been above 132 or lower than 80. Pt was having the lows while he was wearing the 2 v-go 30's. Please advise, Thanks!

## 2015-09-09 NOTE — Telephone Encounter (Signed)
I contacted the pt. Pt stated he did not need a refill on the V-go 30 at this time.

## 2015-09-09 NOTE — Telephone Encounter (Signed)
Please verify: the V-GO is a 40-size.  You take 6 "clicks" 3 times a day (with each meal). Then please reduce to 4 clicks with each meal. Please call next week to report cbg's.

## 2015-09-18 ENCOUNTER — Other Ambulatory Visit: Payer: Self-pay | Admitting: Family Medicine

## 2015-09-20 ENCOUNTER — Other Ambulatory Visit: Payer: Self-pay | Admitting: Internal Medicine

## 2015-09-25 ENCOUNTER — Other Ambulatory Visit: Payer: Self-pay | Admitting: Endocrinology

## 2015-09-26 NOTE — Telephone Encounter (Signed)
Please advise if ok to refill. Rx is not on current med list. Thanks!  

## 2015-10-06 ENCOUNTER — Ambulatory Visit (INDEPENDENT_AMBULATORY_CARE_PROVIDER_SITE_OTHER): Payer: BC Managed Care – PPO | Admitting: Physician Assistant

## 2015-10-06 VITALS — BP 132/84 | HR 80 | Temp 98.4°F | Resp 16 | Ht 68.0 in | Wt 181.4 lb

## 2015-10-06 DIAGNOSIS — R21 Rash and other nonspecific skin eruption: Secondary | ICD-10-CM | POA: Diagnosis not present

## 2015-10-06 DIAGNOSIS — R238 Other skin changes: Secondary | ICD-10-CM

## 2015-10-06 DIAGNOSIS — Z760 Encounter for issue of repeat prescription: Secondary | ICD-10-CM | POA: Diagnosis not present

## 2015-10-06 DIAGNOSIS — Z79899 Other long term (current) drug therapy: Secondary | ICD-10-CM

## 2015-10-06 MED ORDER — LISDEXAMFETAMINE DIMESYLATE 70 MG PO CAPS: 70.0000 mg | ORAL_CAPSULE | Freq: Every day | ORAL | Status: DC

## 2015-10-06 MED ORDER — VALACYCLOVIR HCL 1 G PO TABS: 1000.0000 mg | ORAL_TABLET | Freq: Three times a day (TID) | ORAL | Status: DC

## 2015-10-06 NOTE — Progress Notes (Signed)
10/09/2015 9:19 AM   DOB: 03/17/1951 / MRN: [id number]  SUBJECTIVE:  Jeff [name] is a 65 y.o. male presenting for for a "burning" left sided facial rash that he noticed yesterday morning near his temple.  He feels well overall, denies itching and pain about the rash.  Feels that is it worsening. He has been staying in hotels recently for work.   He would like refills of his ADHD medication.  I have provided him with his last two refills and he is not prescribed controlled substances elsewhere.   He is allergic to flexeril; lidocaine; and novocain.   He  has a past medical history of Diabetes mellitus; Hypertension; Hyperlipidemia; and ADHD (attention deficit hyperactivity disorder).    He  reports that he has never smoked. He does not have any smokeless tobacco history on file. He reports that he does not drink alcohol or use illicit drugs. He  reports that he currently engages in sexual activity. The patient  has past surgical history that includes Neck surgery and Spine surgery.  His family history includes Cancer in his brother; Diabetes in his mother; Heart disease in his father and mother.  Review of Systems  Constitutional: Negative for fever and chills.  Eyes: Negative for blurred vision, double vision, photophobia, pain, discharge and redness.  Respiratory: Negative for cough.   Cardiovascular: Negative for palpitations.  Gastrointestinal: Negative for nausea.  Skin: Positive for rash. Negative for itching.  Neurological: Negative for dizziness.    Problem list and medications reviewed and updated by myself where necessary, and exist elsewhere in the encounter.   OBJECTIVE:  BP 132/84 mmHg  Pulse 80  Temp(Src) 98.4 F (36.9 C) (Oral)  Resp 16  Ht  (1.727 m)  Wt 181 lb 6.4 oz (82.283 kg)  BMI 27.59 kg/m2  SpO2 97%  Physical Exam  Constitutional: He is oriented to person, place, and time. He appears well-developed. He does not appear ill.  Eyes: Conjunctivae and  EOM are normal. Pupils are equal, round, and reactive to light.  Cardiovascular: Normal rate.   Pulmonary/Chest: Effort normal.  Abdominal: He exhibits no distension.  Musculoskeletal: Normal range of motion.  Neurological: He is alert and oriented to person, place, and time. No cranial nerve deficit. Coordination normal.  Skin: Skin is warm and dry. Rash (see photo) noted. He is not diaphoretic.  Psychiatric: He has a normal mood and affect.  Nursing note and vitals reviewed.       No results found for this or any previous visit (from the past 72 hour(s)).  No results found.  ASSESSMENT AND PLAN  Ritchie was seen today for rash and medication refill.  Diagnoses and all orders for this visit:  Vesicular rash: Early zoster most likely.  No T1 involvement thus likely no eye involvement.  Will see him back on Monday.  ED or return here if he eye becomes involved for any reason.  -     valACYclovir (VALTREX) 1000 MG tablet; Take 1 tablet (1,000 mg total) by mouth 3 (three) times daily.  Facial rash -     valACYclovir (VALTREX) 1000 MG tablet; Take 1 tablet (1,000 mg total) by mouth 3 (three) times daily.  Medication refill -     lisdexamfetamine (VYVANSE) 70 MG capsule; Take 1 capsule (70 mg total) by mouth daily. -     lisdexamfetamine (VYVANSE) 70 MG capsule; Take 1 capsule (70 mg total) by mouth daily. Do not fill until 12/06/15 -  lisdexamfetamine (VYVANSE) 70 MG capsule; Take 1 capsule (70 mg total) by mouth daily. Do not fill until 11/06/15  Medication management   The patient was advised to call or return to clinic if he does not see an improvement in symptoms or to seek the care of the closest emergency department if he worsens with the above plan.   Deliah BostonMichael Kohlton Gilpatrick, MHS, PA-C [facility] 10/09/2015 9:19 AM

## 2015-10-06 NOTE — Patient Instructions (Addendum)
Return to clinic for recheck on Monday 10/10/15.  Sooner if worse.     IF you received an x-ray today, you will receive an invoice from [facility]. Please contact [facility] at [phone] with questions or concerns regarding your invoice.   IF you received labwork today, you will receive an invoice from [facility]. Please contact [facility] at [phone] with questions or concerns regarding your invoice.   Our billing staff will not be able to assist you with questions regarding bills from these companies.  You will be contacted with the lab results as soon as they are available. The fastest way to get your results is to activate your My Chart account. Instructions are located on the last page of this paperwork. If you have not heard from us regarding the results in 2 weeks, please contact this office.

## 2015-10-21 LAB — HM DIABETES EYE EXAM

## 2015-10-22 ENCOUNTER — Other Ambulatory Visit: Payer: Self-pay | Admitting: Internal Medicine

## 2015-11-11 ENCOUNTER — Ambulatory Visit (INDEPENDENT_AMBULATORY_CARE_PROVIDER_SITE_OTHER): Payer: BC Managed Care – PPO | Admitting: Endocrinology

## 2015-11-11 VITALS — BP 110/60 | HR 80 | Ht 68.0 in | Wt 179.0 lb

## 2015-11-11 DIAGNOSIS — E1122 Type 2 diabetes mellitus with diabetic chronic kidney disease: Secondary | ICD-10-CM | POA: Diagnosis not present

## 2015-11-11 DIAGNOSIS — N183 Chronic kidney disease, stage 3 (moderate): Secondary | ICD-10-CM | POA: Diagnosis not present

## 2015-11-11 DIAGNOSIS — Z794 Long term (current) use of insulin: Secondary | ICD-10-CM

## 2015-11-11 LAB — POCT GLYCOSYLATED HEMOGLOBIN (HGB A1C): HEMOGLOBIN A1C: 8.1

## 2015-11-11 NOTE — Progress Notes (Signed)
Subjective:    Patient ID: Jeff [name], male    DOB: [birth year], 65 y.o.   MRN: [id number]  HPI Pt returns for f/u of diabetes mellitus: DM type: Insulin-requiring type 2 Dx'ed: 6256 Complications: painful polyneuropathy and renal insufficiency.  Therapy: insulin since 3893, and trulicity. DKA: never Severe hypoglycemia: never.  Pancreatitis: never Other: he started V-GO-40 pump in early 2016.   Interval history: no recent steroids.  He has mild hypoglycemia approx once per week.  This usually happens in the fasting state.  no cbg record, but states cbg's are otherwise well-controlled. He takes 3 clicks with each meal (1 click is 2 units).  He has been traveling a lot recently, for his business Past Medical History  Diagnosis Date  . Diabetes mellitus   . Hypertension   . Hyperlipidemia   . ADHD (attention deficit hyperactivity disorder)     Past Surgical History  Procedure Laterality Date  . Neck surgery    . Spine surgery      Cervical spine x 2; Hardin Negus; [facility].    Social History   Social History  . Marital Status: Single    Spouse Name: N/A  . Number of Children: N/A  . Years of Education: N/A   Occupational History  . Press photographer    Social History Main Topics  . Smoking status: Never Smoker   . Smokeless tobacco: Not on file  . Alcohol Use: No  . Drug Use: No  . Sexual Activity: Yes   Other Topics Concern  . Not on file   Social History Narrative    Current Outpatient Prescriptions on File Prior to Visit  Medication Sig Dispense Refill  . aspirin EC 81 MG tablet Take 1 tablet (81 mg total) by mouth daily. 90 tablet 11  . Dulaglutide (TRULICITY) 1.5 TD/4.2AJ SOPN Inject 1.5 mg into the skin once a week. And pen needles 1/week 4 pen 11  . glucose blood (ACCU-CHEK COMPACT TEST DRUM) test strip 1 each by Other route 4 (four) times daily. And lancets 4/day 360 each 3  . Insulin Disposable Pump (V-GO 40) KIT 1 Device by Does not apply  route daily. 30 kit 11  . lisdexamfetamine (VYVANSE) 70 MG capsule Take 1 capsule (70 mg total) by mouth daily. Do not fill until 11/06/15 30 capsule 0  . lisinopril (PRINIVIL,ZESTRIL) 10 MG tablet Take 1 tablet (10 mg total) by mouth daily. 90 tablet 1  . meloxicam (MOBIC) 7.5 MG tablet TAKE 1 TABLET(7.5 MG) BY MOUTH DAILY 30 tablet 0  . metFORMIN (GLUCOPHAGE) 1000 MG tablet Take 1 tablet (1,000 mg total) by mouth 2 (two) times daily with a meal. 180 tablet 1  . NOVOLOG 100 UNIT/ML injection     . simvastatin (ZOCOR) 40 MG tablet TAKE 1 TABLET BY MOUTH DAILY AT 6:00 PM 90 tablet 1   No current facility-administered medications on file prior to visit.    Allergies  Allergen Reactions  . Flexeril [Cyclobenzaprine Hcl] Other (See Comments)    Causes him to pass out and lowers his BP  . Lidocaine Swelling    throat  . Novocain [Procaine Hcl] Swelling    Family History  Problem Relation Age of Onset  . Heart disease Mother     CHF  . Diabetes Mother   . Heart disease Father   . Cancer Brother     BP 110/60 mmHg  Pulse 80  Ht _0  (1.727 m)  Wt 179 lb (81.194 kg)  BMI 27.22 kg/m2  SpO2 97%   Review of Systems Denies LOC.     Objective:   Physical Exam VITAL SIGNS:  See vs page GENERAL: no distress Pulses: dorsalis pedis intact bilat.   MSK: no deformity of the feet CV: no leg edema.  Skin:  no ulcer on the feet.  normal color and temp on the feet.  Neuro: sensation is intact to touch on the feet, but decreased from normal.    Lab Results  Component Value Date   HGBA1C 8.1 11/11/2015      Assessment & Plan:  Insulin-requiring type 2 DM: he needs increased rx.     Patient is advised the following: Patient Instructions  Please increase clicks to 2 clicks with breakfast, 5 with lunch, and 6 with supper.  Each click is 2 units.   check your blood sugar twice a day.  vary the time of day when you check, between before the 3 meals, and at bedtime.  also check if you  have symptoms of your blood sugar being too high or too low.  please keep a record of the readings and bring it to your next appointment here.  You can write it on any piece of paper.  please call us sooner if your blood sugar goes below 70, or if you have a lot of readings over 200.  Please come back for a follow-up appointment in 3 months.       Renato Shin, MD

## 2015-11-11 NOTE — Patient Instructions (Addendum)
Please increase clicks to 2 clicks with breakfast, 5 with lunch, and 6 with supper.  Each click is 2 units.   check your blood sugar twice a day.  vary the time of day when you check, between before the 3 meals, and at bedtime.  also check if you have symptoms of your blood sugar being too high or too low.  please keep a record of the readings and bring it to your next appointment here.  You can write it on any piece of paper.  please call us sooner if your blood sugar goes below 70, or if you have a lot of readings over 200.  Please come back for a follow-up appointment in 3 months.

## 2015-11-21 ENCOUNTER — Telehealth: Payer: Self-pay | Admitting: Endocrinology

## 2015-11-21 MED ORDER — NOVOLOG 100 UNIT/ML ~~LOC~~ SOLN: SUBCUTANEOUS | Status: DC

## 2015-11-21 NOTE — Telephone Encounter (Signed)
Lee from [facility] called stated Patient need a up to date a prescription for up to 60 unit of Novalog [facility]  [id number] [place] - [address] [facility] [address] [phone] (Phone) [phone] (Fax)

## 2015-11-21 NOTE — Telephone Encounter (Signed)
Rx submitted

## 2015-11-21 NOTE — Telephone Encounter (Signed)
Pt needs rx called to [facility] for the novolog increase to go with the vgo insulin

## 2015-11-21 NOTE — Telephone Encounter (Signed)
Rx submitted per pt's request.  

## 2015-11-26 ENCOUNTER — Other Ambulatory Visit: Payer: Self-pay | Admitting: Internal Medicine

## 2016-02-10 ENCOUNTER — Encounter: Payer: Self-pay | Admitting: Endocrinology

## 2016-02-10 ENCOUNTER — Ambulatory Visit (INDEPENDENT_AMBULATORY_CARE_PROVIDER_SITE_OTHER): Payer: BC Managed Care – PPO | Admitting: Endocrinology

## 2016-02-10 VITALS — BP 128/82 | HR 77 | Ht 68.0 in | Wt 180.0 lb

## 2016-02-10 DIAGNOSIS — N183 Chronic kidney disease, stage 3 unspecified: Secondary | ICD-10-CM

## 2016-02-10 DIAGNOSIS — E1122 Type 2 diabetes mellitus with diabetic chronic kidney disease: Secondary | ICD-10-CM

## 2016-02-10 DIAGNOSIS — Z794 Long term (current) use of insulin: Secondary | ICD-10-CM

## 2016-02-10 LAB — POCT GLYCOSYLATED HEMOGLOBIN (HGB A1C): HEMOGLOBIN A1C: 7.3

## 2016-02-10 NOTE — Patient Instructions (Addendum)
Please change clicks to 4 clicks with breakfast, 3 with lunch, and 6 with supper.  Each click is 2 units.   check your blood sugar twice a day.  vary the time of day when you check, between before the 3 meals, and at bedtime.  also check if you have symptoms of your blood sugar being too high or too low.  please keep a record of the readings and bring it to your next appointment here.  You can write it on any piece of paper.  please call us sooner if your blood sugar goes below 70, or if you have a lot of readings over 200.   Please come back for a follow-up appointment in 3 months.
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
## 2016-03-24 ENCOUNTER — Other Ambulatory Visit: Payer: Self-pay | Admitting: Endocrinology

## 2016-04-25 ENCOUNTER — Telehealth: Payer: Self-pay | Admitting: Endocrinology

## 2016-04-25 MED ORDER — GLUCOSE BLOOD VI STRP: 1.0000 | ORAL_STRIP | Freq: Four times a day (QID) | 3 refills | Status: DC

## 2016-04-25 NOTE — Telephone Encounter (Signed)
Pt called and said that the test strips he currently has have expired and he would like for us to call in a prescription for some new ones to the [facility] [address]  He uses the [facility].

## 2016-04-25 NOTE — Telephone Encounter (Signed)
Refill submitted per patient's request.  

## 2016-04-30 ENCOUNTER — Telehealth: Payer: Self-pay

## 2016-04-30 NOTE — Telephone Encounter (Signed)
Pt is needing a refill on his vyvanse and moxlican   Best number is [phone]

## 2016-05-01 ENCOUNTER — Telehealth: Payer: Self-pay | Admitting: Family Medicine

## 2016-05-01 NOTE — Telephone Encounter (Signed)
Pt calling for refill on vyvance and mobic he walked in yesterday looking for RX he called back today please respond

## 2016-05-02 ENCOUNTER — Other Ambulatory Visit: Payer: Self-pay | Admitting: Physician Assistant

## 2016-05-02 DIAGNOSIS — Z760 Encounter for issue of repeat prescription: Secondary | ICD-10-CM

## 2016-05-02 MED ORDER — MELOXICAM 7.5 MG PO TABS: ORAL_TABLET | ORAL | 0 refills | Status: DC

## 2016-05-02 MED ORDER — LISDEXAMFETAMINE DIMESYLATE 70 MG PO CAPS: 70.0000 mg | ORAL_CAPSULE | Freq: Every day | ORAL | 0 refills | Status: DC

## 2016-05-03 NOTE — Telephone Encounter (Signed)
Addressed in another note

## 2016-05-03 NOTE — Telephone Encounter (Signed)
Called pt, he has already picked up prescriptions.

## 2016-05-04 ENCOUNTER — Other Ambulatory Visit: Payer: Self-pay | Admitting: Endocrinology

## 2016-05-29 ENCOUNTER — Other Ambulatory Visit: Payer: Self-pay | Admitting: Physician Assistant

## 2016-05-30 NOTE — Telephone Encounter (Signed)
10/2015 last ov 05/02/16 last refill

## 2016-05-31 ENCOUNTER — Encounter: Payer: Self-pay | Admitting: Physician Assistant

## 2016-05-31 ENCOUNTER — Ambulatory Visit (INDEPENDENT_AMBULATORY_CARE_PROVIDER_SITE_OTHER): Payer: BC Managed Care – PPO | Admitting: Physician Assistant

## 2016-05-31 VITALS — BP 116/69 | HR 95 | Temp 98.9°F | Resp 16 | Ht 68.0 in | Wt 179.0 lb

## 2016-05-31 DIAGNOSIS — Z760 Encounter for issue of repeat prescription: Secondary | ICD-10-CM | POA: Diagnosis not present

## 2016-05-31 DIAGNOSIS — R7989 Other specified abnormal findings of blood chemistry: Secondary | ICD-10-CM

## 2016-05-31 DIAGNOSIS — R112 Nausea with vomiting, unspecified: Secondary | ICD-10-CM

## 2016-05-31 DIAGNOSIS — F988 Other specified behavioral and emotional disorders with onset usually occurring in childhood and adolescence: Secondary | ICD-10-CM | POA: Diagnosis not present

## 2016-05-31 DIAGNOSIS — Z23 Encounter for immunization: Secondary | ICD-10-CM

## 2016-05-31 LAB — POCT URINALYSIS DIP (MANUAL ENTRY)
Bilirubin, UA: NEGATIVE
GLUCOSE UA: NEGATIVE
Ketones, POC UA: NEGATIVE
Leukocytes, UA: NEGATIVE
NITRITE UA: NEGATIVE
PROTEIN UA: NEGATIVE
RBC UA: NEGATIVE
SPEC GRAV UA: 1.015
UROBILINOGEN UA: 0.2
pH, UA: 7

## 2016-05-31 MED ORDER — MELOXICAM 7.5 MG PO TABS: ORAL_TABLET | ORAL | 3 refills | Status: DC

## 2016-05-31 MED ORDER — LISDEXAMFETAMINE DIMESYLATE 70 MG PO CAPS: 70.0000 mg | ORAL_CAPSULE | Freq: Every day | ORAL | 0 refills | Status: DC

## 2016-05-31 MED ORDER — SITAGLIPTIN PHOSPHATE 100 MG PO TABS: 100.0000 mg | ORAL_TABLET | Freq: Every day | ORAL | 0 refills | Status: DC

## 2016-05-31 NOTE — Patient Instructions (Signed)
     IF you received an x-ray today, you will receive an invoice from [facility]. Please contact [facility] at [phone] with questions or concerns regarding your invoice.   IF you received labwork today, you will receive an invoice from [facility]. Please contact [facility] at [phone] with questions or concerns regarding your invoice.   Our billing staff will not be able to assist you with questions regarding bills from these companies.  You will be contacted with the lab results as soon as they are available. The fastest way to get your results is to activate your My Chart account. Instructions are located on the last page of this paperwork. If you have not heard from us regarding the results in 2 weeks, please contact this office.     

## 2016-05-31 NOTE — Progress Notes (Signed)
06/01/2016 9:05 AM   DOB: December 03, 1950 / MRN: [id number]  SUBJECTIVE:  Jeff [name] is a 65 y.o. male presenting for a history of well controlled diabetes. He would like refills of his Januvia today. He is managed by Endo and just needs a short supply of medication until he sees Dr. Loanne Drilling. He is no longer drinking diet coke.   He has a history of well maintained ADHD and take a low dose of vyvanse.  Reports this continues to work well for him and would like refills today. Denies palpitations, chest pain, SOB, anxiety.   He takes meloxicam for chronic neck pain and paresthesia. His creatinine is slightly elevated. Reports if he misses doses his hands will go numb in a few days.   He complains of nausea with emesis that occurs in the morning only.  Reports he has one episode of emesis and then feels "completely normal."  Reports the nausea occurs on M, T, W, Th, and by Friday he is feeling somewhat better, and by Saturday he is no longer nauseas.  Denies belly pain and bloody emesis.  Takes Trulicity injection of Sunday nights and this has not been increase.   Immunization History  Administered Date(s) Administered  . Influenza, Seasonal, Injecte, Preservative Fre 03/09/2013  . Influenza,inj,Quad PF,36+ Mos 05/31/2016  . Pneumococcal Conjugate-13 10/02/2014  . Pneumococcal Polysaccharide-23 05/31/2016  . Tdap 03/09/2013  . Zoster 06/04/2014     He is allergic to flexeril [cyclobenzaprine hcl]; lidocaine; and novocain [procaine hcl].   He  has a past medical history of ADHD (attention deficit hyperactivity disorder); Diabetes mellitus; Hyperlipidemia; and Hypertension.    He  reports that he has never smoked. He does not have any smokeless tobacco history on file. He reports that he does not drink alcohol or use drugs. He  reports that he currently engages in sexual activity. The patient  has a past surgical history that includes Neck surgery and Spine surgery.  His family history includes  Cancer in his brother; Diabetes in his mother; Heart disease in his father and mother.  Review of Systems  Constitutional: Negative for chills and fever.  Respiratory: Negative for cough.   Cardiovascular: Negative for chest pain.  Gastrointestinal: Negative for nausea.  Musculoskeletal: Positive for myalgias and neck pain (chronic).  Skin: Negative for itching and rash.  Neurological: Negative for dizziness.  Psychiatric/Behavioral: Negative for depression. The patient is not nervous/anxious.     The problem list and medications were reviewed and updated by myself where necessary and exist elsewhere in the encounter.   OBJECTIVE:  BP 116/69 (BP Location: Right Arm, Patient Position: Sitting, Cuff Size: Normal)   Pulse 95   Temp 98.9 F (37.2 C)   Resp 16   Ht _0  (1.727 m)   Wt 179 lb (81.2 kg)   SpO2 97%   BMI 27.22 kg/m   Pulse Readings from Last 3 Encounters:  05/31/16 95  02/10/16 77  11/11/15 80   Lab Results  Component Value Date   HEPAIGM NON REACTIVE 11/22/2013   HEPBIGM NON REACTIVE 11/22/2013    Lab Results  Component Value Date   ALT 20 05/31/2016   AST 16 05/31/2016   ALKPHOS 69 05/31/2016   BILITOT 0.2 05/31/2016      Physical Exam  Constitutional: He is oriented to person, place, and time.  Cardiovascular: Normal rate and regular rhythm.   Pulmonary/Chest: Effort normal and breath sounds normal.  Abdominal: Soft. Bowel sounds are normal. He  exhibits no distension and no mass. There is no tenderness. There is no rebound and no guarding.  Musculoskeletal: Normal range of motion.  Neurological: He is alert and oriented to person, place, and time.     Lab Results  Component Value Date   HGBA1C 7.3 02/10/2016   Lab Results  Component Value Date   CREATININE 1.34 (H) 05/31/2016   BUN 21 05/31/2016   NA 138 05/31/2016   K 4.6 05/31/2016   CL 98 05/31/2016   CO2 23 05/31/2016   Lab Results  Component Value Date   ALT 20 05/31/2016    AST 16 05/31/2016   ALKPHOS 69 05/31/2016   BILITOT 0.2 05/31/2016     Results for orders placed or performed in visit on 05/31/16 (from the past 72 hour(s))  CBC with Differential     Status: Abnormal   Collection Time: 05/31/16  4:27 PM  Result Value Ref Range   WBC 10.0 3.4 - 10.8 x10E3/uL   RBC 5.17 4.14 - 5.80 x10E6/uL   Hemoglobin 15.1 13.0 - 17.7 g/dL   Hematocrit 43.9 37.5 - 51.0 %   MCV 85 79 - 97 fL   MCH 29.2 26.6 - 33.0 pg   MCHC 34.4 31.5 - 35.7 g/dL   RDW 13.1 12.3 - 15.4 %   Platelets 273 150 - 379 x10E3/uL   Neutrophils 61 Not Estab. %   Lymphs 21 Not Estab. %   Monocytes 10 Not Estab. %   Eos 8 Not Estab. %   Basos 0 Not Estab. %   Neutrophils Absolute 6.0 1.4 - 7.0 x10E3/uL   Lymphocytes Absolute 2.1 0.7 - 3.1 x10E3/uL   Monocytes Absolute 1.0 (H) 0.1 - 0.9 x10E3/uL   EOS (ABSOLUTE) 0.8 (H) 0.0 - 0.4 x10E3/uL   Basophils Absolute 0.0 0.0 - 0.2 x10E3/uL   Immature Granulocytes 0 Not Estab. %   Immature Grans (Abs) 0.0 0.0 - 0.1 x10E3/uL  CMP14+EGFR     Status: Abnormal   Collection Time: 05/31/16  4:27 PM  Result Value Ref Range   Glucose 140 (H) 65 - 99 mg/dL   BUN 21 8 - 27 mg/dL   Creatinine, Ser 1.34 (H) 0.76 - 1.27 mg/dL   GFR calc non Af Amer 54 (L) >59 mL/min/1.73   GFR calc Af Amer 63 >59 mL/min/1.73   BUN/Creatinine Ratio 16 10 - 24   Sodium 138 134 - 144 mmol/L   Potassium 4.6 3.5 - 5.2 mmol/L   Chloride 98 96 - 106 mmol/L   CO2 23 18 - 29 mmol/L   Calcium 9.7 8.6 - 10.2 mg/dL   Total Protein 7.1 6.0 - 8.5 g/dL   Albumin 4.5 3.6 - 4.8 g/dL   Globulin, Total 2.6 1.5 - 4.5 g/dL   Albumin/Globulin Ratio 1.7 1.2 - 2.2   Bilirubin Total 0.2 0.0 - 1.2 mg/dL   Alkaline Phosphatase 69 39 - 117 IU/L   AST 16 0 - 40 IU/L   ALT 20 0 - 44 IU/L  POCT urinalysis dipstick     Status: Normal   Collection Time: 05/31/16  4:38 PM  Result Value Ref Range   Color, UA yellow yellow   Clarity, UA clear clear   Glucose, UA negative negative   Bilirubin,  UA negative negative   Ketones, POC UA negative negative   Spec Grav, UA 1.015    Blood, UA negative negative   pH, UA 7.0    Protein Ur, POC negative negative   Urobilinogen,  UA 0.2    Nitrite, UA Negative Negative   Leukocytes, UA Negative Negative    No results found.  ASSESSMENT AND PLAN  Esgar was seen today for medication refill and emesis.  Diagnoses and all orders for this visit:  Non-intractable vomiting with nausea, unspecified vomiting type Comments: Exam and work up unremarkable.  This is most likely secondary to Trulicity admin given the pattern.  He did not want nausea medication.  Orders: -     CBC with Differential -     POCT urinalysis dipstick  Need for influenza vaccination -     Flu Vaccine QUAD 36+ mos IM  Attention deficit disorder (ADD) without hyperactivity -     lisdexamfetamine (VYVANSE) 70 MG capsule; Take 1 capsule (70 mg total) by mouth daily. -     lisdexamfetamine (VYVANSE) 70 MG capsule; Take 1 capsule (70 mg total) by mouth daily. Fill thirty days after signed. -     lisdexamfetamine (VYVANSE) 70 MG capsule; Take 1 capsule (70 mg total) by mouth daily. Fill 60 days after singed.  Medication refill Comments: I am concerned about his need for meloxicam and his elevated CR.  I am sending him to nephrology to establish care.  Orders: -     sitaGLIPtin (JANUVIA) 100 MG tablet; Take 1 tablet (100 mg total) by mouth daily. -     meloxicam (MOBIC) 7.5 MG tablet; TAKE 1 TABLET(7.5 MG) BY MOUTH DAILY -     CMP14+EGFR  Need for prophylactic vaccination against Streptococcus pneumoniae (pneumococcus) -     Pneumococcal polysaccharide vaccine 23-valent greater than or equal to 2yo subcutaneous/IM  Elevated serum creatinine -     Ambulatory referral to Nephrology    The patient is advised to call or return to clinic if he does not see an improvement in symptoms, or to seek the care of the closest emergency department if he worsens with the above  plan.   Philis Fendt, MHS, PA-C [facility] 06/01/2016 9:05 AM

## 2016-06-01 ENCOUNTER — Other Ambulatory Visit: Payer: Self-pay | Admitting: Endocrinology

## 2016-06-01 LAB — CMP14+EGFR
ALT: 20 IU/L (ref 0–44)
AST: 16 IU/L (ref 0–40)
Albumin/Globulin Ratio: 1.7 (ref 1.2–2.2)
Albumin: 4.5 g/dL (ref 3.6–4.8)
Alkaline Phosphatase: 69 IU/L (ref 39–117)
BUN/Creatinine Ratio: 16 (ref 10–24)
BUN: 21 mg/dL (ref 8–27)
Bilirubin Total: 0.2 mg/dL (ref 0.0–1.2)
CALCIUM: 9.7 mg/dL (ref 8.6–10.2)
CO2: 23 mmol/L (ref 18–29)
CREATININE: 1.34 mg/dL — AB (ref 0.76–1.27)
Chloride: 98 mmol/L (ref 96–106)
GFR calc Af Amer: 63 mL/min/{1.73_m2} (ref >59)
GFR, EST NON AFRICAN AMERICAN: 54 mL/min/{1.73_m2} — AB (ref >59)
GLUCOSE: 140 mg/dL — AB (ref 65–99)
Globulin, Total: 2.6 g/dL (ref 1.5–4.5)
POTASSIUM: 4.6 mmol/L (ref 3.5–5.2)
Sodium: 138 mmol/L (ref 134–144)
Total Protein: 7.1 g/dL (ref 6.0–8.5)

## 2016-06-01 LAB — CBC WITH DIFFERENTIAL/PLATELET
BASOS ABS: 0 10*3/uL (ref 0.0–0.2)
Basos: 0 %
EOS (ABSOLUTE): 0.8 10*3/uL — AB (ref 0.0–0.4)
EOS: 8 %
Hematocrit: 43.9 % (ref 37.5–51.0)
Hemoglobin: 15.1 g/dL (ref 13.0–17.7)
IMMATURE GRANULOCYTES: 0 %
Immature Grans (Abs): 0 10*3/uL (ref 0.0–0.1)
Lymphocytes Absolute: 2.1 10*3/uL (ref 0.7–3.1)
Lymphs: 21 %
MCH: 29.2 pg (ref 26.6–33.0)
MCHC: 34.4 g/dL (ref 31.5–35.7)
MCV: 85 fL (ref 79–97)
MONOS ABS: 1 10*3/uL — AB (ref 0.1–0.9)
Monocytes: 10 %
NEUTROS PCT: 61 %
Neutrophils Absolute: 6 10*3/uL (ref 1.4–7.0)
PLATELETS: 273 10*3/uL (ref 150–379)
RBC: 5.17 x10E6/uL (ref 4.14–5.80)
RDW: 13.1 % (ref 12.3–15.4)
WBC: 10 10*3/uL (ref 3.4–10.8)

## 2016-06-01 NOTE — Progress Notes (Signed)
Spoke with Jeff [name] and relayed your message. He will call back in about a month to get a f/u visit scheduled and is awaiting a call from [facility]. Understands that it may be a week or so before they schedule with him as their doctor will review and it is not an urgent matter. States that he will continue taking meloxicam unless told otherwise and will call back/schedule an earlier apt if any issues arise.

## 2016-06-02 NOTE — Progress Notes (Signed)
Not sure if original message went through. I talked with Jeff [name] to let him know that a referral has been made to [facility]. He understands that it may take a week or two to get scheduled with them as this is not an urgent case and he is currently stable. He will continue to take meloxicam unless otherwise instructed and will call us back if anything changes. Will also call after first of the year to get that three month f/u visit scheduled.

## 2016-06-02 NOTE — Progress Notes (Signed)
Thank you Rayfield Citizenaroline. Deliah BostonMichael Haillie Radu, MS, PA-C 7:09 AM, 06/02/2016

## 2016-06-07 ENCOUNTER — Ambulatory Visit (INDEPENDENT_AMBULATORY_CARE_PROVIDER_SITE_OTHER): Payer: BC Managed Care – PPO | Admitting: Endocrinology

## 2016-06-07 ENCOUNTER — Encounter: Payer: Self-pay | Admitting: Endocrinology

## 2016-06-07 VITALS — BP 132/80 | HR 99 | Ht 68.0 in | Wt 180.0 lb

## 2016-06-07 DIAGNOSIS — E1122 Type 2 diabetes mellitus with diabetic chronic kidney disease: Secondary | ICD-10-CM

## 2016-06-07 DIAGNOSIS — Z794 Long term (current) use of insulin: Secondary | ICD-10-CM | POA: Diagnosis not present

## 2016-06-07 DIAGNOSIS — N183 Chronic kidney disease, stage 3 (moderate): Secondary | ICD-10-CM

## 2016-06-07 LAB — POCT GLYCOSYLATED HEMOGLOBIN (HGB A1C): HEMOGLOBIN A1C: 8.4

## 2016-06-07 MED ORDER — GLUCOSE BLOOD VI STRP: 1.0000 | ORAL_STRIP | Freq: Two times a day (BID) | 12 refills | Status: DC

## 2016-06-07 NOTE — Progress Notes (Signed)
Subjective:    Patient ID: Jeff [name], male    DOB: [birth year], 66 y.o.   MRN: [id number]  HPI Pt returns for f/u of diabetes mellitus: DM type: Insulin-requiring type 2 Dx'ed: 1916 Complications: painful polyneuropathy and renal insufficiency.  Therapy: insulin since 6060, and trulicity. DKA: never Severe hypoglycemia: never.  Pancreatitis: never Other: he started V-GO-40 pump in early 2016.   Interval history: no recent steroids. He takes clicks Breakfast:4, Lunch:3, and Supper:6.  no cbg record, but states cbg's are highest in the afternoon, and lowest fasting.  It is mildly low when a meal is missed or delayed.  Past Medical History:  Diagnosis Date  . ADHD (attention deficit hyperactivity disorder)   . Diabetes mellitus   . Hyperlipidemia   . Hypertension     Past Surgical History:  Procedure Laterality Date  . NECK SURGERY    . SPINE SURGERY     Cervical spine x 2; Hardin Negus; [employer].    Social History   Social History  . Marital status: Single    Spouse name: N/A  . Number of children: N/A  . Years of education: N/A   Occupational History  . Press photographer    Social History Main Topics  . Smoking status: Never Smoker  . Smokeless tobacco: Not on file  . Alcohol use No  . Drug use: No  . Sexual activity: Yes   Other Topics Concern  . Not on file   Social History Narrative  . No narrative on file    Current Outpatient Prescriptions on File Prior to Visit  Medication Sig Dispense Refill  . Dulaglutide (TRULICITY) 1.5 OK/5.9XH SOPN Inject 1.5 mg into the skin once a week. And pen needles 1/week 4 pen 11  . Insulin Disposable Pump (V-GO 40) KIT 1 Device by Does not apply route daily. 30 kit 11  . lisdexamfetamine (VYVANSE) 70 MG capsule Take 1 capsule (70 mg total) by mouth daily. Fill 60 days after singed. 30 capsule 0  . lisinopril (PRINIVIL,ZESTRIL) 10 MG tablet Take 1 tablet (10 mg total) by mouth daily. 90 tablet 1  . metFORMIN  (GLUCOPHAGE) 1000 MG tablet Take 1 tablet (1,000 mg total) by mouth 2 (two) times daily with a meal. 180 tablet 1  . NOVOLOG 100 UNIT/ML injection USE 60 UNITS PER DAY PER V-GO PUMP 20 mL 0  . simvastatin (ZOCOR) 40 MG tablet TAKE 1 TABLET BY MOUTH DAILY AT 6:00 PM 90 tablet 1  . aspirin EC 81 MG tablet Take 1 tablet (81 mg total) by mouth daily. (Patient not taking: Reported on 06/07/2016) 90 tablet 11   No current facility-administered medications on file prior to visit.     Allergies  Allergen Reactions  . Flexeril [Cyclobenzaprine Hcl] Other (See Comments)    Causes him to pass out and lowers his BP  . Lidocaine Swelling    throat  . Novocain [Procaine Hcl] Swelling    Family History  Problem Relation Age of Onset  . Heart disease Mother     CHF  . Diabetes Mother   . Heart disease Father   . Cancer Brother     BP 132/80   Pulse 99   Ht '5\' 8"'  (1.727 m)   Wt 180 lb (81.6 kg)   SpO2 98%   BMI 27.37 kg/m   Review of Systems Denies LOC    Objective:   Physical Exam VITAL SIGNS:  See vs page GENERAL: no distress Pulses: dorsalis pedis  intact bilat.   MSK: no deformity of the feet CV: no leg edema.  Skin:  no ulcer on the feet.  normal color and temp on the feet.  Neuro: sensation is intact to touch on the feet, but decreased from normal.       Assessment & Plan:  Insulin-requiring type 2 DM, with renal insufficiency: worse Hypoglycemia, due to missing a meal.   Patient is advised the following: Patient Instructions  Please change clicks to 5 clicks with breakfast, 4 with lunch, and 6 with supper.  Each click is 2 units.   check your blood sugar twice a day.  vary the time of day when you check, between before the 3 meals, and at bedtime.  also check if you have symptoms of your blood sugar being too high or too low.  please keep a record of the readings and bring it to your next appointment here.  You can write it on any piece of paper.  please call us sooner if  your blood sugar goes below 70, or if you have a lot of readings over 200.   On this type of insulin schedule, you should eat meals on a regular schedule.  If a meal is missed or significantly delayed, your blood sugar could go low.   Please come back for a follow-up appointment in 3 months.

## 2016-06-07 NOTE — Patient Instructions (Addendum)
Please change clicks to 5 clicks with breakfast, 4 with lunch, and 6 with supper.  Each click is 2 units.   check your blood sugar twice a day.  vary the time of day when you check, between before the 3 meals, and at bedtime.  also check if you have symptoms of your blood sugar being too high or too low.  please keep a record of the readings and bring it to your next appointment here.  You can write it on any piece of paper.  please call us sooner if your blood sugar goes below 70, or if you have a lot of readings over 200.   On this type of insulin schedule, you should eat meals on a regular schedule.  If a meal is missed or significantly delayed, your blood sugar could go low.   Please come back for a follow-up appointment in 3 months.

## 2016-06-15 ENCOUNTER — Other Ambulatory Visit: Payer: Self-pay

## 2016-06-15 DIAGNOSIS — M79609 Pain in unspecified limb: Secondary | ICD-10-CM

## 2016-06-15 DIAGNOSIS — N289 Disorder of kidney and ureter, unspecified: Secondary | ICD-10-CM

## 2016-06-15 DIAGNOSIS — R202 Paresthesia of skin: Secondary | ICD-10-CM

## 2016-06-15 DIAGNOSIS — M542 Cervicalgia: Secondary | ICD-10-CM

## 2016-06-15 NOTE — Telephone Encounter (Signed)
[id number]  (H)  [phone]  Jeff [name](C)  Jeff BostonMichael [name]   Please call patient regarding his appointment visit with Kidney MD today. He is thinking about going off of one of his medications and wants to discuss this with you.

## 2016-06-16 MED ORDER — ACETAMINOPHEN 500 MG PO TABS: 1000.0000 mg | ORAL_TABLET | Freq: Three times a day (TID) | ORAL | 99 refills | Status: DC | PRN

## 2016-06-16 NOTE — Telephone Encounter (Signed)
I spoke with patient. He saw Jeff [name] yesterday and was advised that he was in stage three [name] disease.  This was mostly attributable to uncontrolled diabetes, however specialist did want to stop the Meloxicam and start an OTC cream on the neck.  Patient has a history of UE aresthesia that returns when he stops meloxicam in the past.  I will get him in with orthopedics, [name] to see what non NSAID options Jeff [name] may have.  For now he will take tylenol.  [name] specialist also had some concern over lisinopril. I will await correspondence from MD and pending that may stop Lisinopril and would plan to start a low dose of Norvasc.

## 2016-06-18 ENCOUNTER — Other Ambulatory Visit: Payer: Self-pay | Admitting: Nephrology

## 2016-06-18 DIAGNOSIS — N183 Chronic kidney disease, stage 3 unspecified: Secondary | ICD-10-CM

## 2016-06-22 ENCOUNTER — Ambulatory Visit: Payer: BC Managed Care – PPO | Admitting: Internal Medicine

## 2016-06-29 ENCOUNTER — Ambulatory Visit
Admission: RE | Admit: 2016-06-29 | Discharge: 2016-06-29 | Disposition: A | Discharge status: Home / Self Care | Payer: BC Managed Care – PPO | Source: Ambulatory Visit | Attending: Nephrology | Admitting: Nephrology

## 2016-06-29 DIAGNOSIS — N183 Chronic kidney disease, stage 3 unspecified: Secondary | ICD-10-CM

## 2016-07-04 ENCOUNTER — Other Ambulatory Visit: Payer: Self-pay | Admitting: Physician Assistant

## 2016-07-04 DIAGNOSIS — Z760 Encounter for issue of repeat prescription: Secondary | ICD-10-CM

## 2016-07-04 NOTE — Telephone Encounter (Signed)
[facility] Lisinopril, Metformin.  Rout[facility] to [facility] Romero [facility]ellingSean Ellison [facility]

## 2016-07-06 ENCOUNTER — Other Ambulatory Visit: Payer: Self-pay

## 2016-07-06 MED ORDER — METFORMIN HCL 1000 MG PO TABS: 1000.0000 mg | ORAL_TABLET | Freq: Two times a day (BID) | ORAL | 1 refills | Status: DC

## 2016-07-06 NOTE — Telephone Encounter (Signed)
As I only see pt for DM, please refill the januvia and metformin only.

## 2016-07-14 ENCOUNTER — Other Ambulatory Visit: Payer: Self-pay | Admitting: Endocrinology

## 2016-07-17 ENCOUNTER — Other Ambulatory Visit: Payer: Self-pay | Admitting: Physician Assistant

## 2016-08-13 ENCOUNTER — Telehealth: Payer: Self-pay | Admitting: Endocrinology

## 2016-08-13 MED ORDER — V-GO 40 KIT: 1.0000 | PACK | Freq: Every day | 11 refills | Status: DC

## 2016-08-13 NOTE — Telephone Encounter (Signed)
VGo Rep called in and said that the patient has been without insulin this weekend due to not having any supplies, they request we call the patient and let them know what they should do.

## 2016-08-13 NOTE — Telephone Encounter (Signed)
Requested a call back from the patient to discuss further.  

## 2016-08-13 NOTE — Telephone Encounter (Signed)
I contacted the patient and advised of message. Patient voiced understanding and had no further questions.  

## 2016-08-13 NOTE — Telephone Encounter (Signed)
Pt returned your call.  He was without because he was in [place] and was unable to get a pump, the one he had broke while in [place].

## 2016-08-13 NOTE — Telephone Encounter (Signed)
I need to know more.  Is pt unable to get V-GO pumps, or do we need to send a prescription?

## 2016-08-13 NOTE — Telephone Encounter (Signed)
Ok, I have sent a prescription to your pharmacy, to refill V-GO pumps.

## 2016-08-13 NOTE — Telephone Encounter (Signed)
See message and please advise, Thanks!  

## 2016-08-23 ENCOUNTER — Other Ambulatory Visit: Payer: Self-pay | Admitting: Physician Assistant

## 2016-08-23 ENCOUNTER — Telehealth: Payer: Self-pay | Admitting: Physician Assistant

## 2016-08-23 DIAGNOSIS — F988 Other specified behavioral and emotional disorders with onset usually occurring in childhood and adolescence: Secondary | ICD-10-CM

## 2016-08-23 MED ORDER — LISDEXAMFETAMINE DIMESYLATE 70 MG PO CAPS: 70.0000 mg | ORAL_CAPSULE | Freq: Every day | ORAL | 0 refills | Status: DC

## 2016-08-23 NOTE — Telephone Encounter (Signed)
Patient needs his lisdexamfetamine (VYVANSE) 70 MG capsule because he is going out of town for work for 6 weeks and has enough to last a month but not the whole time.   Already talked to Casimiro NeedleMichael about this. He is expecting this message   He states he would be glad to come get it tomorrow 08/24/16 if you could print it out for him.  Thank you!

## 2016-09-05 ENCOUNTER — Ambulatory Visit (INDEPENDENT_AMBULATORY_CARE_PROVIDER_SITE_OTHER): Payer: BC Managed Care – PPO

## 2016-09-05 ENCOUNTER — Ambulatory Visit (INDEPENDENT_AMBULATORY_CARE_PROVIDER_SITE_OTHER): Payer: BC Managed Care – PPO | Admitting: Physician Assistant

## 2016-09-05 VITALS — BP 119/77 | HR 97 | Temp 98.1°F | Resp 18 | Ht 68.0 in | Wt 178.8 lb

## 2016-09-05 DIAGNOSIS — F988 Other specified behavioral and emotional disorders with onset usually occurring in childhood and adolescence: Secondary | ICD-10-CM

## 2016-09-05 DIAGNOSIS — R1032 Left lower quadrant pain: Secondary | ICD-10-CM

## 2016-09-05 DIAGNOSIS — K5900 Constipation, unspecified: Secondary | ICD-10-CM

## 2016-09-05 DIAGNOSIS — R7309 Other abnormal glucose: Secondary | ICD-10-CM

## 2016-09-05 LAB — POCT URINALYSIS DIP (MANUAL ENTRY)
BILIRUBIN UA: NEGATIVE
Bilirubin, UA: NEGATIVE
Blood, UA: NEGATIVE
LEUKOCYTES UA: NEGATIVE
Nitrite, UA: NEGATIVE
PROTEIN UA: NEGATIVE
SPEC GRAV UA: 1.01 (ref 1.030–1.035)
Urobilinogen, UA: 0.2 (ref ?–2.0)
pH, UA: 6 (ref 5.0–8.0)

## 2016-09-05 LAB — POCT CBC
Granulocyte percent: 63.7 %G (ref 37–80)
HCT, POC: 40.9 % — AB (ref 43.5–53.7)
Hemoglobin: 14.2 g/dL (ref 14.1–18.1)
Lymph, poc: 1.9 (ref 0.6–3.4)
MCH, POC: 29.6 pg (ref 27–31.2)
MCHC: 34.6 g/dL (ref 31.8–35.4)
MCV: 85.5 fL (ref 80–97)
MID (cbc): 1 — AB (ref 0–0.9)
MPV: 7.8 fL (ref 0–99.8)
POC GRANULOCYTE: 5.1 (ref 2–6.9)
POC LYMPH %: 24.3 % (ref 10–50)
POC MID %: 12 % (ref 0–12)
Platelet Count, POC: 219 10*3/uL (ref 142–424)
RBC: 4.79 M/uL (ref 4.69–6.13)
RDW, POC: 13.3 %
WBC: 8 10*3/uL (ref 4.6–10.2)

## 2016-09-05 LAB — POCT GLYCOSYLATED HEMOGLOBIN (HGB A1C): Hemoglobin A1C: 8.8

## 2016-09-05 MED ORDER — LISDEXAMFETAMINE DIMESYLATE 70 MG PO CAPS: 70.0000 mg | ORAL_CAPSULE | Freq: Every day | ORAL | 0 refills | Status: DC

## 2016-09-05 MED ORDER — POLYETHYLENE GLYCOL 3350 17 G PO PACK: 17.0000 g | PACK | Freq: Every day | ORAL | 0 refills | Status: DC

## 2016-09-05 NOTE — Patient Instructions (Addendum)
I am so glad you have changed your diet.  Given your long history of diabetes you will probably need to get more weight off before you can stop taking your metformin along with other diabetic agents. Please go back on your metformin.   Your chloesterol panel is pending. Once it results I will get you your ten year risk however I am all but certain you should be taking cholesterol medication to protect your heart.  I will plan to put you on a low dose of crestor which will give [place] a very big benefit and few side effects.       IF you received an x-ray today, you will receive an invoice from [facility]. Please contact [facility] at [phone] with questions or concerns regarding your invoice.   IF you received labwork today, you will receive an invoice from [place]. Please contact [facility] at [phone] with questions or concerns regarding your invoice.   Our billing staff will not be able to assist you with questions regarding bills from these companies.  You will be contacted with the lab results as soon as they are available. The fastest way to get your results is to activate your My Chart account. Instructions are located on the last page of this paperwork. If you have not heard from [place] regarding the results in 2 weeks, please contact this office.

## 2016-09-05 NOTE — Progress Notes (Signed)
09/05/2016 3:23 PM   DOB: 03/25/1951 / MRN: [id number]  SUBJECTIVE:  Jeff [name] is a 66 y.o. male presenting for vyvanse refills.  He takes this to hlep him focus.  He is a Psychologist, occupational and travels all over the country. Without the medication he will often can't focus and has trouble with numbers, which is his livelihood.   He has been dieting and he has lost some weight. He was has stopped drinking diet coke, which he was drinking 12 diet coke daily and now is drinking none.  He has been on a new diet composed of whole foods and limited carbohydrates.   He stopped his lisinopril.  He stopped his simvastin.  He has not had either in a month.     He is allergic to flexeril [cyclobenzaprine hcl]; lidocaine; and novocain [procaine hcl].   He  has a past medical history of ADHD (attention deficit hyperactivity disorder); Diabetes mellitus; Hyperlipidemia; and Hypertension.    He  reports that he has never smoked. He has never used smokeless tobacco. He reports that he does not drink alcohol or use drugs. He  reports that he currently engages in sexual activity. The patient  has a past surgical history that includes Neck surgery and Spine surgery.  His family history includes Cancer in his brother; Diabetes in his mother; Heart disease in his father and mother.  Review of Systems  Constitutional: Negative for chills, diaphoresis and fever.  Respiratory: Negative for cough, hemoptysis, sputum production, shortness of breath and wheezing.   Cardiovascular: Negative for chest pain, orthopnea and leg swelling.  Gastrointestinal: Positive for abdominal pain and constipation. Negative for blood in stool, diarrhea, heartburn, melena, nausea and vomiting.  Genitourinary: Negative for flank pain.  Skin: Negative for rash.  Neurological: Negative for dizziness.    The problem list and medications were reviewed and updated by myself where necessary and exist elsewhere in the encounter.   OBJECTIVE:  BP  119/77   Pulse 97   Temp 98.1 F (36.7 C) (Oral)   Resp 18   Ht  (1.727 m)   Wt 178 lb 12.8 oz (81.1 kg)   SpO2 96%   BMI 27.19 kg/m   Wt Readings from Last 3 Encounters:  09/05/16 178 lb 12.8 oz (81.1 kg)  06/07/16 180 lb (81.6 kg)  05/31/16 179 lb (81.2 kg)      Physical Exam  Constitutional: He appears well-developed. He is active and cooperative.  Non-toxic appearance.  Cardiovascular: Normal rate, regular rhythm, S1 normal, S2 normal, normal heart sounds, intact distal pulses and normal pulses.  Exam reveals no gallop and no friction rub.   No murmur heard. Pulmonary/Chest: Effort normal. No tachypnea. He has no rales.  Abdominal: Soft. Normal appearance and bowel sounds are normal. He exhibits no distension and no mass. There is no tenderness. There is no rigidity, no rebound, no guarding and no CVA tenderness. No hernia.  Musculoskeletal: He exhibits no edema.  Neurological: He is alert.  Skin: Skin is warm and dry. He is not diaphoretic. No pallor.  Vitals reviewed.  Lab Results  Component Value Date   CHOL 176 08/17/2015   HDL 47 08/17/2015   LDLCALC 96 08/17/2015   TRIG 165 (H) 08/17/2015   CHOLHDL 3.7 08/17/2015   Results for orders placed or performed in visit on 09/05/16 (from the past 72 hour(s))  POCT urinalysis dipstick     Status: Abnormal   Collection Time: 09/05/16  2:40 PM  Result  Value Ref Range   Color, UA yellow yellow   Clarity, UA clear clear   Glucose, UA =500 (A) negative   Bilirubin, UA negative negative   Ketones, POC UA negative negative   Spec Grav, UA 1.010 1.030 - 1.035   Blood, UA negative negative   pH, UA 6.0 5.0 - 8.0   Protein Ur, POC negative negative   Urobilinogen, UA 0.2 Negative - 2.0   Nitrite, UA Negative Negative   Leukocytes, UA Negative Negative  POCT glycosylated hemoglobin (Hb A1C)     Status: None   Collection Time: 09/05/16  2:49 PM  Result Value Ref Range   Hemoglobin A1C 8.8   POCT CBC     Status:  Abnormal   Collection Time: 09/05/16  2:52 PM  Result Value Ref Range   WBC 8.0 4.6 - 10.2 K/uL   Lymph, poc 1.9 0.6 - 3.4   POC LYMPH PERCENT 24.3 10 - 50 %L   MID (cbc) 1.0 (A) 0 - 0.9   POC MID % 12.0 0 - 12 %M   POC Granulocyte 5.1 2 - 6.9   Granulocyte percent 63.7 37 - 80 %G   RBC 4.79 4.69 - 6.13 M/uL   Hemoglobin 14.2 14.1 - 18.1 g/dL   HCT, POC 16.1 (A) 09.6 - 53.7 %   MCV 85.5 80 - 97 fL   MCH, POC 29.6 27 - 31.2 pg   MCHC 34.6 31.8 - 35.4 g/dL   RDW, POC 04.5 %   Platelet Count, POC 219 142 - 424 K/uL   MPV 7.8 0 - 99.8 fL    Dg Abd 2 Views  Result Date: 09/05/2016 CLINICAL DATA:  Lower abdominal pain and constipation EXAM: ABDOMEN - 2 VIEW COMPARISON:  None. FINDINGS: Supine and upright images obtained. There is diffuse stool throughout the colon. There is no bowel bowel dilatation or air-fluid levels suggesting bowel obstruction. No free air. Visualized lung bases are clear. There is mild vascular calcification in pelvis. There is a radiopaque foreign body either in or overlying the left mid abdomen. IMPRESSION: Diffuse stool throughout colon consistent constipation. No bowel obstruction or free air. There is mild iliac artery atherosclerosis. Radiopaque foreign body either in or overlying the left mid abdomen. Electronically Signed   By: Bretta Bang III M.D.   On: 09/05/2016 15:17    ASSESSMENT AND PLAN:  Moustafa was seen today for medication refill and abdominal pain.  Diagnoses and all orders for this visit:  Elevated hemoglobin A1c: He will continue his diet and go back on his metformin. He has follow up in place with endo. He will start back on his metformin as prescribed.  I have advised that if he continue to lose weight with prudent diet that metformin would most likely be the last medication to come off and that insulin would most likely be the first medication to go given the risk of hypoglycemia.  He voiced understanding and will not stop any more medications  without seeing one of his providers first. Once his lipid panel results I will recalculate his ASCVD and plan to start him on crestor.  -     POCT glycosylated hemoglobin (Hb A1C) -     Renal Function Panel -     POCT urinalysis dipstick  Attention deficit disorder (ADD) without hyperactivity Comments: Controlled. Refill.  Orders: -     lisdexamfetamine (VYVANSE) 70 MG capsule; Take 1 capsule (70 mg total) by mouth daily. -  lisdexamfetamine (VYVANSE) 70 MG capsule; Take 1 capsule (70 mg total) by mouth daily. Fill thirty days after signed. -     lisdexamfetamine (VYVANSE) 70 MG capsule; Take 1 capsule (70 mg total) by mouth daily. Fill 60 days after signed  LLQ abdominal pain: CBC normal. Rads consistent with constipation. Exam not impressive with normal bowel sounds. Vitals normal today.  I think he is likely constipated given recent diet changes and cessation of metformin.  He will go back on metformin today. Advising he try some miralax. He will come back tomorrow or the next day if he is not feeling better.   -     DG Abd 2 Views; Future -     POCT CBC    The patient is advised to call or return to clinic if he does not see an improvement in symptoms, or to seek the care of the closest emergency department if he worsens with the above plan.   [name], MHS, PA-C [facility] 09/05/2016 3:23 PM

## 2016-09-06 LAB — RENAL FUNCTION PANEL
ALBUMIN: 4.1 g/dL (ref 3.6–4.8)
BUN / CREAT RATIO: 18 (ref 10–24)
BUN: 20 mg/dL (ref 8–27)
CHLORIDE: 98 mmol/L (ref 96–106)
CO2: 21 mmol/L (ref 18–29)
Calcium: 9.4 mg/dL (ref 8.6–10.2)
Creatinine, Ser: 1.14 mg/dL (ref 0.76–1.27)
GFR, EST AFRICAN AMERICAN: 77 mL/min/{1.73_m2} (ref >59)
GFR, EST NON AFRICAN AMERICAN: 66 mL/min/{1.73_m2} (ref >59)
GLUCOSE: 205 mg/dL — AB (ref 65–99)
Phosphorus: 3.1 mg/dL (ref 2.5–4.5)
Potassium: 4 mmol/L (ref 3.5–5.2)
Sodium: 140 mmol/L (ref 134–144)

## 2016-09-07 ENCOUNTER — Other Ambulatory Visit: Payer: Self-pay | Admitting: Endocrinology

## 2016-09-14 ENCOUNTER — Ambulatory Visit (INDEPENDENT_AMBULATORY_CARE_PROVIDER_SITE_OTHER): Payer: BC Managed Care – PPO | Admitting: Endocrinology

## 2016-09-14 ENCOUNTER — Encounter: Payer: Self-pay | Admitting: Endocrinology

## 2016-09-14 VITALS — BP 116/72 | HR 100 | Ht 68.0 in | Wt 175.0 lb

## 2016-09-14 DIAGNOSIS — N183 Chronic kidney disease, stage 3 (moderate): Secondary | ICD-10-CM

## 2016-09-14 DIAGNOSIS — E1122 Type 2 diabetes mellitus with diabetic chronic kidney disease: Secondary | ICD-10-CM

## 2016-09-14 DIAGNOSIS — Z794 Long term (current) use of insulin: Secondary | ICD-10-CM | POA: Diagnosis not present

## 2016-09-14 NOTE — Patient Instructions (Addendum)
Please continue these clicks: 5 clicks with breakfast, 4 with lunch, and 6 with supper.  Each click is 2 units.   check your blood sugar twice a day.  vary the time of day when you check, between before the 3 meals, and at bedtime.  also check if you have symptoms of your blood sugar being too high or too low.  please keep a record of the readings and bring it to your next appointment here.  You can write it on any piece of paper.  please call us sooner if your blood sugar goes below 70, or if you have a lot of readings over 200.   On this type of insulin schedule, you should eat meals on a regular schedule.  If a meal is missed or significantly delayed, your blood sugar could go low.   blood tests are requested for you today.  We'll let you know about the results.   Please come back for a follow-up appointment in 3 months.

## 2016-09-14 NOTE — Progress Notes (Signed)
Subjective:    Patient ID: Jeff [name], male    DOB: May 27, 1951, 66 y.o.   MRN: [id number]  HPI Pt returns for f/u of diabetes mellitus: DM type: Insulin-requiring type 2 Dx'ed: 7096 Complications: painful polyneuropathy and renal insufficiency.  Therapy: insulin since 2836, and trulicity. DKA: never Severe hypoglycemia: never.  Pancreatitis: never. Other: he started V-GO-40 pump in early 2016.   Interval history: no recent steroids. 6 weeks ago, he misses 2 weeks of insulin, due to the pump filling device breaking.  pt states he feels well in general.  Since back on insulin, cbg's are well-controlled.  no cbg record, but states cbg's are highest at hs.   Past Medical History:  Diagnosis Date  . ADHD (attention deficit hyperactivity disorder)   . Diabetes mellitus   . Hyperlipidemia   . Hypertension     Past Surgical History:  Procedure Laterality Date  . NECK SURGERY    . SPINE SURGERY     Cervical spine x 2; Hardin Negus; [employer].    Social History   Social History  . Marital status: Single    Spouse name: N/A  . Number of children: N/A  . Years of education: N/A   Occupational History  . Press photographer    Social History Main Topics  . Smoking status: Never Smoker  . Smokeless tobacco: Never Used  . Alcohol use No  . Drug use: No  . Sexual activity: Yes   Other Topics Concern  . Not on file   Social History Narrative  . No narrative on file    Current Outpatient Prescriptions on File Prior to Visit  Medication Sig Dispense Refill  . glucose blood (ONETOUCH VERIO) test strip 1 each by Other route 2 (two) times daily. And lancets 2/day 100 each 12  . Insulin Disposable Pump (V-GO 40) KIT 1 Device by Does not apply route daily. 30 kit 11  . lisdexamfetamine (VYVANSE) 70 MG capsule Take 1 capsule (70 mg total) by mouth daily. Fill 60 days after signed 30 capsule 0  . lisinopril (PRINIVIL,ZESTRIL) 10 MG tablet TAKE 1 TABLET(10 MG) BY MOUTH DAILY  90 tablet 0  . metFORMIN (GLUCOPHAGE) 1000 MG tablet Take 1 tablet (1,000 mg total) by mouth 2 (two) times daily with a meal. 180 tablet 1  . NOVOLOG 100 UNIT/ML injection USE 60 UNITS PER DAY PER V-GO PUMP 20 mL 0  . TRULICITY 1.5 OQ/9.4TM SOPN INJECT 1.5MG INTO THE SKIN ONCE A WEEK AS DIRECTED 2 mL 0  . polyethylene glycol (MIRALAX) packet Take 17 g by mouth daily. Take 2-3 times per day for mild to moderate constipation. (Patient not taking: Reported on 09/14/2016) 17 packet 0   No current facility-administered medications on file prior to visit.     Allergies  Allergen Reactions  . Flexeril [Cyclobenzaprine Hcl] Other (See Comments)    Causes him to pass out and lowers his BP  . Lidocaine Swelling    throat  . Novocain [Procaine Hcl] Swelling    Family History  Problem Relation Age of Onset  . Heart disease Mother     CHF  . Diabetes Mother   . Heart disease Father   . Cancer Brother     BP 116/72   Pulse 100   Ht '5\' 8"'  (1.727 m)   Wt 175 lb (79.4 kg)   SpO2 97%   BMI 26.61 kg/m   Review of Systems He denies hypoglycemia.      Objective:  Physical Exam VITAL SIGNS:  See vs page GENERAL: no distress.  Pulses: dorsalis pedis intact bilat.   MSK: no deformity of the feet CV: no leg edema.  Skin:  no ulcer on the feet.  normal color and temp on the feet.  Neuro: sensation is intact to touch on the feet, but decreased from normal.    Lab Results  Component Value Date   HGBA1C 8.8 09/05/2016   Lab Results  Component Value Date   CREATININE 1.14 09/05/2016   BUN 20 09/05/2016   NA 140 09/05/2016   K 4.0 09/05/2016   CL 98 09/05/2016   CO2 21 09/05/2016      Assessment & Plan:  Insulin-requiring type 2 DM: worse.  Pump filling device malfunction, new.  As he says this is affecting a1c, we'll check fructosamine.  Renal insufficiency: in this context, and on V-GO, he is at risk for noturnal hypoglycemia, so goal a1c is in the 7's.  Patient Instructions    Please continue these clicks: 5 clicks with breakfast, 4 with lunch, and 6 with supper.  Each click is 2 units.   check your blood sugar twice a day.  vary the time of day when you check, between before the 3 meals, and at bedtime.  also check if you have symptoms of your blood sugar being too high or too low.  please keep a record of the readings and bring it to your next appointment here.  You can write it on any piece of paper.  please call us sooner if your blood sugar goes below 70, or if you have a lot of readings over 200.   On this type of insulin schedule, you should eat meals on a regular schedule.  If a meal is missed or significantly delayed, your blood sugar could go low.   blood tests are requested for you today.  We'll let you know about the results.   Please come back for a follow-up appointment in 3 months.

## 2016-09-18 ENCOUNTER — Other Ambulatory Visit: Payer: Self-pay | Admitting: Endocrinology

## 2016-09-19 LAB — FRUCTOSAMINE: Fructosamine: 318 umol/L — ABNORMAL HIGH (ref 190–270)

## 2016-10-10 ENCOUNTER — Other Ambulatory Visit: Payer: Self-pay | Admitting: Endocrinology

## 2016-10-11 ENCOUNTER — Telehealth: Payer: Self-pay | Admitting: Dietician

## 2016-10-11 NOTE — Telephone Encounter (Signed)
Brief Nutrition Note Called patient regarding the [facility] Blood Sugar Remedy book (menu and recipes) that he brought by our office.  He is questioning if this is a healthy diet for him to follow.  He is 175 lbs and 5'8" tall and has a V-go.  Patient is not available.  Left message for him to return my call and provided the [facility] office number as well as the Nutrition and Diabetes Office number.  Oran ReinLaura Jobe, RD, LDN

## 2016-10-14 ENCOUNTER — Other Ambulatory Visit: Payer: Self-pay | Admitting: Physician Assistant

## 2016-10-16 ENCOUNTER — Other Ambulatory Visit: Payer: Self-pay | Admitting: Endocrinology

## 2016-10-16 ENCOUNTER — Other Ambulatory Visit: Payer: Self-pay | Admitting: Physician Assistant

## 2016-10-16 MED ORDER — ROSUVASTATIN CALCIUM 10 MG PO TABS: 10.0000 mg | ORAL_TABLET | Freq: Every day | ORAL | 3 refills | Status: DC

## 2016-10-16 NOTE — Telephone Encounter (Signed)
Jeff [name], I do not see a cholesterol medication on Mar. I saw your note from last ov for patient to continue Crestor at a low dose. Please advise  I do not want to refill if patient is on a different cholesterol medication

## 2016-10-18 ENCOUNTER — Telehealth: Payer: Self-pay | Admitting: Dietician

## 2016-10-18 NOTE — Telephone Encounter (Signed)
Brief Nutrition Note: Patient of Dr. Everardo AllEllison who left a cookbook/diet plan in my office wondering if this was a good plan with his diabetes.    Called patient.  He stated that prior to February, he was drinking 12-16 diet coke daily.  He changed how he eats to decrease carbohydrate and stopped the diet coke.  He reports a weight loss of 7 lbs and change of CKD from stage 3 to stage 2.  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  He has been happy with his meal habits and we discussed some changes to make it fit for him better.  Discussed healthy carbohydrate choices and benefit of continuing to eat increased vegetables and small amounts of lean meat.  He did state that he was making a smoothie with 1/2 cup honey.  Discussed that he should reduce the honey to no more than 1 tablespoon.  He drinks the smoothie every 2 weeks.  He continues to use the V-go pump.  Will mail him My plate and the meal plan card.  Questions answered and patient to call for further questions.  Jeff ReinLaura Zarielle Cea, RD, LDN

## 2016-10-30 ENCOUNTER — Other Ambulatory Visit: Payer: Self-pay

## 2016-10-30 MED ORDER — LISINOPRIL 10 MG PO TABS: ORAL_TABLET | ORAL | 0 refills | Status: DC

## 2016-11-12 ENCOUNTER — Other Ambulatory Visit: Payer: Self-pay | Admitting: Endocrinology

## 2016-11-16 ENCOUNTER — Other Ambulatory Visit: Payer: Self-pay | Admitting: Endocrinology

## 2016-11-21 NOTE — Telephone Encounter (Signed)
Pt is on Crestor

## 2016-12-14 ENCOUNTER — Ambulatory Visit (INDEPENDENT_AMBULATORY_CARE_PROVIDER_SITE_OTHER): Payer: BC Managed Care – PPO | Admitting: Endocrinology

## 2016-12-14 ENCOUNTER — Encounter: Payer: Self-pay | Admitting: Endocrinology

## 2016-12-14 VITALS — BP 126/82 | HR 80 | Ht 68.0 in | Wt 179.0 lb

## 2016-12-14 DIAGNOSIS — N183 Chronic kidney disease, stage 3 (moderate): Secondary | ICD-10-CM

## 2016-12-14 DIAGNOSIS — E1122 Type 2 diabetes mellitus with diabetic chronic kidney disease: Secondary | ICD-10-CM

## 2016-12-14 DIAGNOSIS — Z794 Long term (current) use of insulin: Secondary | ICD-10-CM | POA: Diagnosis not present

## 2016-12-14 LAB — POCT GLYCOSYLATED HEMOGLOBIN (HGB A1C): HEMOGLOBIN A1C: 11.1

## 2016-12-14 NOTE — Progress Notes (Signed)
Subjective:    Patient ID: Jeff [name], male    DOB: 1950/06/05, 66 y.o.   MRN: [id number]  HPI Pt returns for f/u of diabetes mellitus: DM type: Insulin-requiring type 2 Dx'ed: 3295 Complications: painful polyneuropathy and renal insufficiency.  Therapy: insulin since 1884, and trulicity. DKA: never Severe hypoglycemia: never.  Pancreatitis: never. Other: he started V-GO-40 pump in early 2016.   Interval history: no recent steroids. pt states he feels well in general.  no cbg record, but states cbg's vary from 90-200's.  It is lowest fasting, and highest in the afternoon.  He takes 5 clicks with breakfast, 4 with lunch, and 6 with supper.  He often takes extra clicks if it is high.  He says he often uses the max amount per day Past Medical History:  Diagnosis Date  . ADHD (attention deficit hyperactivity disorder)   . Diabetes mellitus   . Hyperlipidemia   . Hypertension     Past Surgical History:  Procedure Laterality Date  . NECK SURGERY    . SPINE SURGERY     Cervical spine x 2; Hardin Negus; [employer].    Social History   Social History  . Marital status: Single    Spouse name: N/A  . Number of children: N/A  . Years of education: N/A   Occupational History  . Press photographer    Social History Main Topics  . Smoking status: Never Smoker  . Smokeless tobacco: Never Used  . Alcohol use No  . Drug use: No  . Sexual activity: Yes   Other Topics Concern  . Not on file   Social History Narrative  . No narrative on file    Current Outpatient Prescriptions on File Prior to Visit  Medication Sig Dispense Refill  . glucose blood (ONETOUCH VERIO) test strip 1 each by Other route 2 (two) times daily. And lancets 2/day 100 each 12  . Insulin Disposable Pump (V-GO 40) KIT 1 Device by Does not apply route daily. 30 kit 11  . lisdexamfetamine (VYVANSE) 70 MG capsule Take 1 capsule (70 mg total) by mouth daily. Fill 60 days after signed 30 capsule 0  .  lisinopril (PRINIVIL,ZESTRIL) 10 MG tablet TAKE 1 TABLET(10 MG) BY MOUTH DAILY 90 tablet 0  . metFORMIN (GLUCOPHAGE) 1000 MG tablet Take 1 tablet (1,000 mg total) by mouth 2 (two) times daily with a meal. 180 tablet 1  . NOVOLOG 100 UNIT/ML injection USE 60 UNITS PER DAY PER V-GO PUMP 20 mL 0  . rosuvastatin (CRESTOR) 10 MG tablet Take 1 tablet (10 mg total) by mouth daily. 90 tablet 3  . TRULICITY 1.5 ZY/6.0YT SOPN INJECT 1.5MG INTO THE SKIN ONCE A WEEK AS DIRECTED 2 mL 0   No current facility-administered medications on file prior to visit.     Allergies  Allergen Reactions  . Flexeril [Cyclobenzaprine Hcl] Other (See Comments)    Causes him to pass out and lowers his BP  . Lidocaine Swelling    throat  . Novocain [Procaine Hcl] Swelling    Family History  Problem Relation Age of Onset  . Heart disease Mother        CHF  . Diabetes Mother   . Heart disease Father   . Cancer Brother     BP 126/82   Pulse 80   Ht '5\' 8"'  (1.727 m)   Wt 179 lb (81.2 kg)   SpO2 96%   BMI 27.22 kg/m    Review of Systems  He denies hypoglycemia    Objective:   Physical Exam VITAL SIGNS:  See vs page GENERAL: no distress.  Pulses: foot pulses are intact bilaterally.   MSK: no deformity of the feet or ankles.  CV: no edema of the legs or ankles Skin:  no ulcer on the feet or ankles.  normal color and temp on the feet and ankles Neuro: sensation is intact to touch on the feet and ankles.        A1c=11.1%    Assessment & Plan:  Insulin-requiring type 2 DM, with renal insuff: much worse.  He declines to change rx.  Obesity: worse..  We discussed.  Pt will redouble dietary efforts.    Patient Instructions  Please increase to these clicks: 6 clicks with breakfast, 6 with lunch, and 7 with supper.  Each click is 2 units.   check your blood sugar twice a day.  vary the time of day when you check, between before the 3 meals, and at bedtime.  also check if you have symptoms of your blood sugar  being too high or too low.  please keep a record of the readings and bring it to your next appointment here.  You can write it on any piece of paper.  please call us sooner if your blood sugar goes below 70, or if you have a lot of readings over 200.   On this type of insulin schedule, you should eat meals on a regular schedule.  If a meal is missed or significantly delayed, your blood sugar could go low.   Please come back for a follow-up appointment in 2 months.

## 2016-12-14 NOTE — Patient Instructions (Addendum)
Please increase to these clicks: 6 clicks with breakfast, 6 with lunch, and 7 with supper.  Each click is 2 units.   check your blood sugar twice a day.  vary the time of day when you check, between before the 3 meals, and at bedtime.  also check if you have symptoms of your blood sugar being too high or too low.  please keep a record of the readings and bring it to your next appointment here.  You can write it on any piece of paper.  please call us sooner if your blood sugar goes below 70, or if you have a lot of readings over 200.   On this type of insulin schedule, you should eat meals on a regular schedule.  If a meal is missed or significantly delayed, your blood sugar could go low.   Please come back for a follow-up appointment in 2 months.

## 2016-12-22 ENCOUNTER — Other Ambulatory Visit: Payer: Self-pay | Admitting: Endocrinology

## 2017-01-04 ENCOUNTER — Other Ambulatory Visit: Payer: Self-pay | Admitting: Physician Assistant

## 2017-01-08 ENCOUNTER — Other Ambulatory Visit: Payer: Self-pay | Admitting: Endocrinology

## 2017-01-08 ENCOUNTER — Other Ambulatory Visit: Payer: Self-pay

## 2017-01-08 ENCOUNTER — Telehealth: Payer: Self-pay | Admitting: Endocrinology

## 2017-01-08 MED ORDER — INSULIN PEN NEEDLE 31G X 5 MM MISC: 0 refills | Status: DC

## 2017-01-08 MED ORDER — INSULIN GLARGINE 100 UNIT/ML SOLOSTAR PEN: PEN_INJECTOR | SUBCUTANEOUS | 99 refills | Status: DC

## 2017-01-08 NOTE — Telephone Encounter (Signed)
I would suggest to add an extra Lantus injection at bedtime: 20 units per day, along with the VGo>> would he agree with this, if so, we can call 1 box in. He may need needles, also. If sugars continue to stay high, increase by 6 units every 4 days until he gets to ~40 units. Call us with sugars in the next few days.

## 2017-01-08 NOTE — Telephone Encounter (Signed)
Can you advise? I told patient to go to the ED if blood sugars got any higher & his current blood sugar was at 512. He said that he is on the v-go pump & metformin. Dr. Everardo AllEllison had mentioned possibly a second pump?

## 2017-01-08 NOTE — Telephone Encounter (Signed)
Called patient and sent in Lantus prescription in with directions. Patient understood with currently no further questions. He will call back to let us know how sugars are doing in a few days.

## 2017-01-08 NOTE — Telephone Encounter (Signed)
Called and left patient a VM to call back so we can send in Lantus insulin.

## 2017-01-08 NOTE — Telephone Encounter (Signed)
Pt called he was sent home from work today due to blood sugars. Per patient: Sunday pm 311 Monday am 356 Monday before lunch 406 Monday after lunch 311 Monday pm 281 Tuesday (Today) before breakfast 215 Tuesday after breakfast @ 0757 blood sugar 356 Please call pt at home [phone] he is home now. Call cell [phone] if he does not answer.

## 2017-01-09 ENCOUNTER — Other Ambulatory Visit: Payer: Self-pay

## 2017-01-09 ENCOUNTER — Telehealth: Payer: Self-pay | Admitting: Physician Assistant

## 2017-01-09 MED ORDER — NOVOLOG 100 UNIT/ML ~~LOC~~ SOLN: SUBCUTANEOUS | 1 refills | Status: DC

## 2017-01-09 NOTE — Telephone Encounter (Signed)
Please advise and place referral if appropriate

## 2017-01-09 NOTE — Telephone Encounter (Signed)
Pt has had a emergency with his blood sugar going up to over 500 and dr Everardo Allellison did not respond like the patient would have liked and would like Jeff NeedleMichael to refer him to a new endocrinologist   Best number [phone]

## 2017-01-10 ENCOUNTER — Encounter: Payer: Self-pay | Admitting: Physician Assistant

## 2017-01-10 ENCOUNTER — Ambulatory Visit (INDEPENDENT_AMBULATORY_CARE_PROVIDER_SITE_OTHER): Payer: BC Managed Care – PPO | Admitting: Physician Assistant

## 2017-01-10 VITALS — BP 127/83 | HR 96 | Resp 16 | Ht 68.0 in | Wt 183.0 lb

## 2017-01-10 DIAGNOSIS — E1122 Type 2 diabetes mellitus with diabetic chronic kidney disease: Secondary | ICD-10-CM

## 2017-01-10 DIAGNOSIS — Z794 Long term (current) use of insulin: Secondary | ICD-10-CM | POA: Diagnosis not present

## 2017-01-10 DIAGNOSIS — N183 Chronic kidney disease, stage 3 (moderate): Secondary | ICD-10-CM

## 2017-01-10 DIAGNOSIS — R739 Hyperglycemia, unspecified: Secondary | ICD-10-CM

## 2017-01-10 LAB — POCT CBC
GRANULOCYTE PERCENT: 70.7 % (ref 37–80)
HCT, POC: 41.5 % — AB (ref 43.5–53.7)
Hemoglobin: 14.1 g/dL (ref 14.1–18.1)
Lymph, poc: 2 (ref 0.6–3.4)
MCH: 29.1 pg (ref 27–31.2)
MCHC: 33.9 g/dL (ref 31.8–35.4)
MCV: 85.9 fL (ref 80–97)
MID (CBC): 0.4 (ref 0–0.9)
MPV: 8.3 fL (ref 0–99.8)
PLATELET COUNT, POC: 260 10*3/uL (ref 142–424)
POC Granulocyte: 5.9 (ref 2–6.9)
POC LYMPH PERCENT: 24.2 %L (ref 10–50)
POC MID %: 5.1 % (ref 0–12)
RBC: 4.84 M/uL (ref 4.69–6.13)
RDW, POC: 13.4 %
WBC: 8.3 10*3/uL (ref 4.6–10.2)

## 2017-01-10 LAB — POCT GLYCOSYLATED HEMOGLOBIN (HGB A1C): Hemoglobin A1C: 14

## 2017-01-10 LAB — POCT URINALYSIS DIP (MANUAL ENTRY)
BILIRUBIN UA: NEGATIVE
Blood, UA: NEGATIVE
Ketones, POC UA: NEGATIVE mg/dL
Leukocytes, UA: NEGATIVE
Nitrite, UA: NEGATIVE
Protein Ur, POC: NEGATIVE mg/dL
UROBILINOGEN UA: 0.2 U/dL
pH, UA: 5.5 (ref 5.0–8.0)

## 2017-01-10 LAB — GLUCOSE, POCT (MANUAL RESULT ENTRY)
POC GLUCOSE: 404 mg/dL — AB (ref 70–99)
POC GLUCOSE: 326 mg/dL — AB (ref 70–99)

## 2017-01-10 MED ORDER — SODIUM CHLORIDE 0.9 % IV BOLUS (SEPSIS): 1000.0000 mL | Freq: Once | INTRAVENOUS | Status: DC

## 2017-01-10 MED ORDER — INSULIN DETEMIR 100 UNIT/ML ~~LOC~~ SOLN: 20.0000 [IU] | Freq: Every day | SUBCUTANEOUS | 11 refills | Status: DC

## 2017-01-10 MED ORDER — INSULIN ASPART 100 UNIT/ML ~~LOC~~ SOLN
15.0000 [IU] | Freq: Once | SUBCUTANEOUS | Status: AC
  Administered 2017-01-10: 15 [IU] via SUBCUTANEOUS

## 2017-01-10 MED ORDER — INSULIN DETEMIR 100 UNIT/ML ~~LOC~~ SOLN: 20.0000 [IU] | Freq: Every day | SUBCUTANEOUS | 3 refills | Status: DC

## 2017-01-10 MED ORDER — INSULIN DETEMIR 100 UNIT/ML ~~LOC~~ SOLN: 10.0000 [IU] | Freq: Every day | SUBCUTANEOUS | 11 refills | Status: DC

## 2017-01-10 MED ORDER — INSULIN PEN NEEDLE 32G X 4 MM MISC: 6 refills | Status: DC

## 2017-01-10 NOTE — Patient Instructions (Addendum)
Hypoglycemia is a condition caused by a very low level of blood sugar (glucose), your body's main energy source. Hypoglycemia is often related to the treatment of diabetes. However, a variety of conditions - many rare - can cause low blood sugar in people without diabetes. Like fever, hypoglycemia isn't a disease itself - it's an indicator of a health problem. Immediate treatment of hypoglycemia is necessary when blood sugar levels are at 70 milligrams per deciliter (mg/dL) or 3.9 millimoles per liter (mmol/L) or below. Treatment involves quick steps to get your blood sugar level back into a normal range either with high-sugar foods or drinks or with medications. Long-term treatment requires identifying and treating the underlying cause of hypoglycemia. Symptoms If blood sugar levels become too low, signs and symptoms may include: An irregular heart rhythm  Fatigue  Pale skin  Shakiness  Anxiety  Sweating  Hunger  Irritability  Tingling sensation around the mouth  Crying out during sleep As hypoglycemia worsens, signs and symptoms may include: Confusion, abnormal behavior or both, such as the inability to complete routine tasks  Visual disturbances, such as blurred vision  Seizures  Loss of consciousness People with severe hypoglycemia may appear as if they're intoxicated. They may slur their words and move clumsily. When to see a doctor Seek a doctor's help immediately if: You have what may be symptoms of hypoglycemia and you don't have diabetes.  You have diabetes and hypoglycemia isn't responding to treatment. Initial treatment of hypoglycemia is drinking juice or regular soft drinks, eating candy or taking glucose tablets. If this treatment doesn't raise your blood sugar and improve your symptoms, contact your doctor right away.  I would like to see you back in one week for a follow up. Thank you for allowing me to care for you today. Feel better.  Michael L. Chestine Sporelark, MS, PA-C  IF you  received an x-ray today, you will receive an invoice from [facility]. Please contact [facility] at [phone] with questions or concerns regarding your invoice.   IF you received labwork today, you will receive an invoice from [facility]. Please contact [facility] at [phone] with questions or concerns regarding your invoice.   Our billing staff will not be able to assist you with questions regarding bills from these companies.  You will be contacted with the lab results as soon as they are available. The fastest way to get your results is to activate your My Chart account. Instructions are located on the last page of this paperwork. If you have not heard from us regarding the results in 2 weeks, please contact this office.

## 2017-01-10 NOTE — Progress Notes (Signed)
01/10/2017 6:38 PM   DOB: 09/22/1950 / MRN: [id number]  SUBJECTIVE:  Jeff [name] is a 66 y.o. male presenting for hyperglycemia.  Tells me his blood sugar was 512.  Took some time off from work but did have some difficulty with driving.  Called his endocrinologist and was advised to start Lantus (per patient) and patient was prescribed Lantus which his insurance would not pay for.  Levemir is covered by patient's insurance.  Patient does associate fatigue today.   He is allergic to flexeril [cyclobenzaprine hcl]; lidocaine; and novocain [procaine hcl].   He  has a past medical history of ADHD (attention deficit hyperactivity disorder); Diabetes mellitus; Hyperlipidemia; and Hypertension.    He  reports that he has never smoked. He has never used smokeless tobacco. He reports that he does not drink alcohol or use drugs. He  reports that he currently engages in sexual activity. The patient  has a past surgical history that includes Neck surgery and Spine surgery.  His family history includes Cancer in his brother; Diabetes in his mother; Heart disease in his father and mother.  Review of Systems  Constitutional: Negative for chills, diaphoresis and fever.  Respiratory: Negative for cough, hemoptysis, sputum production, shortness of breath and wheezing.   Cardiovascular: Negative for chest pain, orthopnea and leg swelling.  Gastrointestinal: Negative for nausea.  Skin: Negative for rash.  Neurological: Negative for dizziness.    The problem list and medications were reviewed and updated by myself where necessary and exist elsewhere in the encounter.   OBJECTIVE:  BP 127/83 (BP Location: Right Arm, Patient Position: Sitting, Cuff Size: Large)   Pulse 96   Resp 16   Ht 5\' 8"  (1.727 m)   Wt 183 lb (83 kg)   SpO2 98%   BMI 27.83 kg/m   Physical Exam  Constitutional: He appears well-developed. He is active and cooperative.  Non-toxic appearance.  Cardiovascular: Normal rate, regular  rhythm, S1 normal, S2 normal, normal heart sounds, intact distal pulses and normal pulses.  Exam reveals no gallop and no friction rub.   No murmur heard. Pulmonary/Chest: Effort normal. No stridor. No tachypnea. No respiratory distress. He has no wheezes. He has no rales.  Abdominal: He exhibits no distension.  Musculoskeletal: He exhibits no edema.  Neurological: He is alert.  Skin: Skin is warm and dry. He is not diaphoretic. No pallor.  Vitals reviewed.      Results for orders placed or performed in visit on 01/10/17 (from the past 72 hour(s))  POCT glucose (manual entry)     Status: Abnormal   Collection Time: 01/10/17  4:42 PM  Result Value Ref Range   POC Glucose 404 (A) 70 - 99 mg/dl  POCT CBC     Status: Abnormal   Collection Time: 01/10/17  4:42 PM  Result Value Ref Range   WBC 8.3 4.6 - 10.2 K/uL   Lymph, poc 2.0 0.6 - 3.4   POC LYMPH PERCENT 24.2 10 - 50 %L   MID (cbc) 0.4 0 - 0.9   POC MID % 5.1 0 - 12 %M   POC Granulocyte 5.9 2 - 6.9   Granulocyte percent 70.7 37 - 80 %G   RBC 4.84 4.69 - 6.13 M/uL   Hemoglobin 14.1 14.1 - 18.1 g/dL   HCT, POC 40.941.5 (A) 81.143.5 - 53.7 %   MCV 85.9 80 - 97 fL   MCH, POC 29.1 27 - 31.2 pg   MCHC 33.9 31.8 -  35.4 g/dL   RDW, POC 65.7 %   Platelet Count, POC 260 142 - 424 K/uL   MPV 8.3 0 - 99.8 fL  POCT glycosylated hemoglobin (Hb A1C)     Status: None   Collection Time: 01/10/17  4:47 PM  Result Value Ref Range   Hemoglobin A1C 14.0   POCT glucose (manual entry)     Status: Abnormal   Collection Time: 01/10/17  5:42 PM  Result Value Ref Range   POC Glucose 326 (A) 70 - 99 mg/dl  POCT urinalysis dipstick     Status: Abnormal   Collection Time: 01/10/17  6:36 PM  Result Value Ref Range   Color, UA yellow yellow   Clarity, UA clear clear   Glucose, UA >=1,000 (A) negative mg/dL   Bilirubin, UA negative negative   Ketones, POC UA negative negative mg/dL   Spec Grav, UA <=8.469 (A) 1.010 - 1.025   Blood, UA negative negative     pH, UA 5.5 5.0 - 8.0   Protein Ur, POC negative negative mg/dL   Urobilinogen, UA 0.2 0.2 or 1.0 E.U./dL   Nitrite, UA Negative Negative   Leukocytes, UA Negative Negative     No results found.  ASSESSMENT AND PLAN:  [name] was seen today for diabetes and heartburn.  Diagnoses and all orders for this visit:  Type 2 diabetes mellitus with stage 3 chronic kidney disease, with long-term current use of insulin (HCC): 1.5 liters of fluid here and 15 units of Novolog.  Blood glucose own, patient feeling symptomatically better. Stable for discharge.  Adding Levemir and he will continue to see Dr. Everardo All for chronic management of diabetes.  -     POCT glycosylated hemoglobin (Hb A1C) -     Microalbumin, urine -     POCT urinalysis dipstick -     POCT glucose (manual entry)  Hyperglycemia -     POCT CBC -     sodium chloride 0.9 % bolus 1,000 mL; Inject 1,000 mLs into the vein once. -     Insert peripheral IV -     PSA -     Basic metabolic panel -     Discontinue: insulin detemir (LEVEMIR) 100 UNIT/ML injection; Inject 0.1 mLs (10 Units total) into the skin at bedtime. -     insulin aspart (novoLOG) injection 15 Units; Inject 0.15 mLs (15 Units total) into the skin once. -     POCT glucose (manual entry)  Other orders -     insulin detemir (LEVEMIR) 100 UNIT/ML injection; Inject 0.2 mLs (20 Units total) into the skin at bedtime.    The patient is advised to call or return to clinic if he does not see an improvement in symptoms, or to seek the care of the closest emergency department if he worsens with the above plan.   [name], MHS, PA-C Primary Care at [facility] 01/10/2017 6:38 PM

## 2017-01-11 ENCOUNTER — Telehealth: Payer: Self-pay | Admitting: Physician Assistant

## 2017-01-11 LAB — BASIC METABOLIC PANEL
BUN/Creatinine Ratio: 18 (ref 10–24)
BUN: 20 mg/dL (ref 8–27)
CALCIUM: 9.3 mg/dL (ref 8.6–10.2)
CO2: 21 mmol/L (ref 20–29)
CREATININE: 1.14 mg/dL (ref 0.76–1.27)
Chloride: 101 mmol/L (ref 96–106)
GFR calc Af Amer: 76 mL/min/{1.73_m2} (ref >59)
GFR, EST NON AFRICAN AMERICAN: 66 mL/min/{1.73_m2} (ref >59)
GLUCOSE: 405 mg/dL — AB (ref 65–99)
Potassium: 4.9 mmol/L (ref 3.5–5.2)
Sodium: 135 mmol/L (ref 134–144)

## 2017-01-11 LAB — PSA: Prostate Specific Ag, Serum: 0.6 ng/mL (ref 0.0–4.0)

## 2017-01-11 LAB — MICROALBUMIN, URINE: Microalbumin, Urine: <3 ug/mL

## 2017-01-11 LAB — VENIPUNCTURE

## 2017-01-11 NOTE — Telephone Encounter (Signed)
Please correct order

## 2017-01-11 NOTE — Progress Notes (Signed)
Spoke with patient.  Sugar did come down tonight and at last check was at 180.  Advised he continue his 20 of Levemir and to take no more than 5 novolog tid until follow up next week.  No other changes in meds

## 2017-01-11 NOTE — Telephone Encounter (Signed)
Pt pharmacy is calling stating that the diabetic medication that was called in for the patient is supposed  to be the pen not vial and they need to confirm the instructions for the dosage 10 or 20   Best number to pharmacy is [phone]

## 2017-01-11 NOTE — Telephone Encounter (Signed)
Addressed by provider after hours on 01/10/2017

## 2017-01-15 ENCOUNTER — Other Ambulatory Visit: Payer: Self-pay

## 2017-01-15 MED ORDER — BASAGLAR KWIKPEN 100 UNIT/ML ~~LOC~~ SOPN: 20.0000 [IU] | PEN_INJECTOR | Freq: Every day | SUBCUTANEOUS | 2 refills | Status: DC

## 2017-01-16 ENCOUNTER — Encounter: Payer: Self-pay | Admitting: Physician Assistant

## 2017-01-16 ENCOUNTER — Ambulatory Visit (INDEPENDENT_AMBULATORY_CARE_PROVIDER_SITE_OTHER): Payer: BC Managed Care – PPO | Admitting: Physician Assistant

## 2017-01-16 VITALS — BP 111/73 | HR 101 | Resp 16 | Ht 68.0 in | Wt 182.0 lb

## 2017-01-16 DIAGNOSIS — Z794 Long term (current) use of insulin: Secondary | ICD-10-CM | POA: Diagnosis not present

## 2017-01-16 DIAGNOSIS — E1165 Type 2 diabetes mellitus with hyperglycemia: Secondary | ICD-10-CM

## 2017-01-16 DIAGNOSIS — E118 Type 2 diabetes mellitus with unspecified complications: Secondary | ICD-10-CM

## 2017-01-16 DIAGNOSIS — IMO0002 Reserved for concepts with insufficient information to code with codable children: Secondary | ICD-10-CM

## 2017-01-16 LAB — POCT URINALYSIS DIPSTICK
Bilirubin, UA: NEGATIVE
GLUCOSE UA: NEGATIVE
KETONES UA: NEGATIVE
Leukocytes, UA: NEGATIVE
NITRITE UA: NEGATIVE
PH UA: 5.5 (ref 5.0–8.0)
Protein, UA: NEGATIVE
RBC UA: NEGATIVE
SPEC GRAV UA: 1.02 (ref 1.010–1.025)
Urobilinogen, UA: 0.2 E.U./dL

## 2017-01-16 LAB — POCT CBC
Granulocyte percent: 70 %G (ref 37–80)
HCT, POC: 42.3 % — AB (ref 43.5–53.7)
HEMOGLOBIN: 14.5 g/dL (ref 14.1–18.1)
LYMPH, POC: 2.1 (ref 0.6–3.4)
MCH: 29.5 pg (ref 27–31.2)
MCHC: 34.3 g/dL (ref 31.8–35.4)
MCV: 86.1 fL (ref 80–97)
MID (cbc): 0.8 (ref 0–0.9)
MPV: 8.9 fL (ref 0–99.8)
POC Granulocyte: 6.7 (ref 2–6.9)
POC LYMPH PERCENT: 22 %L (ref 10–50)
POC MID %: 8 % (ref 0–12)
Platelet Count, POC: 304 10*3/uL (ref 142–424)
RBC: 4.91 M/uL (ref 4.69–6.13)
RDW, POC: 13.7 %
WBC: 9.6 10*3/uL (ref 4.6–10.2)

## 2017-01-16 LAB — GLUCOSE, POCT (MANUAL RESULT ENTRY): POC Glucose: 267 mg/dl — AB (ref 70–99)

## 2017-01-16 NOTE — Progress Notes (Signed)
01/16/2017 4:35 PM   DOB: [birth year] / MRN: [id number]  SUBJECTIVE:  Jeff [name] is a 66 y.o. male presenting for diabetes recheck.  Tells me he continues to feel terrible. Was treated for hypoglycemia by me roughly 1 week ago with 1.5 liters of fluid and I started him on Levemir 20 at night and was advised to take 5 units of fast acting with meals and to keep the remainer of his medications the same.  Lowest BCG that he has measured is 181 and measures are averaging in the 250-260s.     He is allergic to flexeril [cyclobenzaprine hcl]; lidocaine; and novocain [procaine hcl].   He  has a past medical history of ADHD (attention deficit hyperactivity disorder); Diabetes mellitus; Hyperlipidemia; and Hypertension.    He  reports that he has never smoked. He has never used smokeless tobacco. He reports that he does not drink alcohol or use drugs. He  reports that he currently engages in sexual activity. The patient  has a past surgical history that includes Neck surgery and Spine surgery.  His family history includes Cancer in his brother; Diabetes in his mother; Heart disease in his father and mother.  Review of Systems  Constitutional: Negative for chills, diaphoresis and fever.  Respiratory: Negative for shortness of breath.   Cardiovascular: Negative for chest pain, orthopnea and leg swelling.  Gastrointestinal: Negative for nausea.  Skin: Negative for rash.  Neurological: Negative for dizziness.    The problem list and medications were reviewed and updated by myself where necessary and exist elsewhere in the encounter.   OBJECTIVE:  BP 111/73 (BP Location: Right Arm, Patient Position: Sitting, Cuff Size: Normal)   Pulse (!) 101   Resp 16   Ht 5\' 8"  (1.727 m)   Wt 182 lb (82.6 kg)   SpO2 96%   BMI 27.67 kg/m   Physical Exam  Constitutional: He appears well-developed. He is active and cooperative.  Non-toxic appearance.  Cardiovascular: Normal rate, regular rhythm, S1 normal,  S2 normal, normal heart sounds, intact distal pulses and normal pulses.  Exam reveals no gallop and no friction rub.   No murmur heard. Pulmonary/Chest: Effort normal. No stridor. No tachypnea. No respiratory distress. He has no wheezes. He has no rales.  Abdominal: He exhibits no distension.  Musculoskeletal: He exhibits no edema.  Neurological: He is alert.  Skin: Skin is warm and dry. He is not diaphoretic. No pallor.  Vitals reviewed.   Results for orders placed or performed in visit on 01/16/17 (from the past 72 hour(s))  POCT glucose (manual entry)     Status: Abnormal   Collection Time: 01/16/17  3:48 PM  Result Value Ref Range   POC Glucose 267 (A) 70 - 99 mg/dl  POCT urinalysis dipstick     Status: None   Collection Time: 01/16/17  3:53 PM  Result Value Ref Range   Color, UA yellow    Clarity, UA clear    Glucose, UA negative    Bilirubin, UA negative    Ketones, UA negative    Spec Grav, UA 1.020 1.010 - 1.025   Blood, UA negative    pH, UA 5.5 5.0 - 8.0   Protein, UA negative    Urobilinogen, UA 0.2 0.2 or 1.0 E.U./dL   Nitrite, UA negative    Leukocytes, UA Negative Negative  POCT CBC     Status: Abnormal   Collection Time: 01/16/17  4:06 PM  Result Value Ref Range  WBC 9.6 4.6 - 10.2 K/uL   Lymph, poc 2.1 0.6 - 3.4   POC LYMPH PERCENT 22.0 10 - 50 %L   MID (cbc) 0.8 0 - 0.9   POC MID % 8.0 0 - 12 %M   POC Granulocyte 6.7 2 - 6.9   Granulocyte percent 70.0 37 - 80 %G   RBC 4.91 4.69 - 6.13 M/uL   Hemoglobin 14.5 14.1 - 18.1 g/dL   HCT, POC 47.8 (A) 29.5 - 53.7 %   MCV 86.1 80 - 97 fL   MCH, POC 29.5 27 - 31.2 pg   MCHC 34.3 31.8 - 35.4 g/dL   RDW, POC 62.1 %   Platelet Count, POC 304 142 - 424 K/uL   MPV 8.9 0 - 99.8 fL   Lab Results  Component Value Date   CREATININE 1.14 01/10/2017   BUN 20 01/10/2017   NA 135 01/10/2017   K 4.9 01/10/2017   CL 101 01/10/2017   CO2 21 01/10/2017   Wt Readings from Last 3 Encounters:  01/16/17 182 lb (82.6 kg)   01/10/17 183 lb (83 kg)  12/14/16 179 lb (81.2 kg)   CBC Latest Ref Rng & Units 01/16/2017 01/10/2017 09/05/2016  WBC 4.6 - 10.2 K/uL 9.6 8.3 8.0  Hemoglobin 14.1 - 18.1 g/dL 30.8 65.7 84.6  Hematocrit 43.5 - 53.7 % 42.3(A) 41.5(A) 40.9(A)  Platelets 150 - 379 x10E3/uL - - -     No results found.  ASSESSMENT AND PLAN:  Jeff [name] was seen today for follow-up.  Diagnoses and all orders for this visit:  Uncontrolled type 2 diabetes mellitus with complication, with long-term current use of insulin (HCC): Will increase his levemir to 30 units nightly and continue the remainder of his diabetes plan. Will forward to Dr. Everardo All and hopefully he can get in in the next week or so.  Thus far I can not identify any signs of symptoms of infection that could be pushing his blood glucose up.  -     Cancel: Glucose (CBG) -     POCT urinalysis dipstick -     Comprehensive metabolic panel -     POCT glucose (manual entry) -     POCT CBC    The patient is advised to call or return to clinic if he does not see an improvement in symptoms, or to seek the care of the closest emergency department if he worsens with the above plan.   [name], MHS, PA-C Primary Care at [facility] 01/16/2017 4:35 PM

## 2017-01-16 NOTE — Patient Instructions (Addendum)
Increase you levemir to 30 units nightly.     IF you received an x-ray today, you will receive an invoice from [facility]. Please contact [facility] at [phone] with questions or concerns regarding your invoice.   IF you received labwork today, you will receive an invoice from [facility]. Please contact [facility] at [phone] with questions or concerns regarding your invoice.   Our billing staff will not be able to assist you with questions regarding bills from these companies.  You will be contacted with the lab results as soon as they are available. The fastest way to get your results is to activate your My Chart account. Instructions are located on the last page of this paperwork. If you have not heard from us regarding the results in 2 weeks, please contact this office.

## 2017-01-17 ENCOUNTER — Telehealth: Payer: Self-pay | Admitting: Endocrinology

## 2017-01-17 LAB — COMPREHENSIVE METABOLIC PANEL
ALBUMIN: 4.4 g/dL (ref 3.6–4.8)
ALK PHOS: 66 IU/L (ref 39–117)
ALT: 37 IU/L (ref 0–44)
AST: 31 IU/L (ref 0–40)
Albumin/Globulin Ratio: 1.7 (ref 1.2–2.2)
BUN/Creatinine Ratio: 21 (ref 10–24)
BUN: 27 mg/dL (ref 8–27)
CO2: 20 mmol/L (ref 20–29)
CREATININE: 1.3 mg/dL — AB (ref 0.76–1.27)
Calcium: 9.9 mg/dL (ref 8.6–10.2)
Chloride: 98 mmol/L (ref 96–106)
GFR calc Af Amer: 65 mL/min/{1.73_m2} (ref >59)
GFR calc non Af Amer: 56 mL/min/{1.73_m2} — ABNORMAL LOW (ref >59)
GLOBULIN, TOTAL: 2.6 g/dL (ref 1.5–4.5)
Glucose: 264 mg/dL — ABNORMAL HIGH (ref 65–99)
POTASSIUM: 4.6 mmol/L (ref 3.5–5.2)
SODIUM: 137 mmol/L (ref 134–144)
Total Protein: 7 g/dL (ref 6.0–8.5)

## 2017-01-17 NOTE — Telephone Encounter (Signed)
Patient needs an appointment as soon as possible. Patient 's blood sugar was in the 200's today and was in the 500s yesterday. Call  [phone] to advise.

## 2017-01-17 NOTE — Telephone Encounter (Signed)
LM for pt to call back to schedule °

## 2017-01-20 ENCOUNTER — Other Ambulatory Visit: Payer: Self-pay | Admitting: Endocrinology

## 2017-01-21 ENCOUNTER — Ambulatory Visit (INDEPENDENT_AMBULATORY_CARE_PROVIDER_SITE_OTHER): Payer: BC Managed Care – PPO | Admitting: Endocrinology

## 2017-01-21 ENCOUNTER — Encounter: Payer: Self-pay | Admitting: Endocrinology

## 2017-01-21 VITALS — BP 130/84 | HR 86 | Wt 180.6 lb

## 2017-01-21 DIAGNOSIS — E1122 Type 2 diabetes mellitus with diabetic chronic kidney disease: Secondary | ICD-10-CM

## 2017-01-21 DIAGNOSIS — N183 Chronic kidney disease, stage 3 (moderate): Secondary | ICD-10-CM

## 2017-01-21 DIAGNOSIS — Z794 Long term (current) use of insulin: Secondary | ICD-10-CM | POA: Diagnosis not present

## 2017-01-21 LAB — POCT GLYCOSYLATED HEMOGLOBIN (HGB A1C): Hemoglobin A1C: 10.8

## 2017-01-21 MED ORDER — INSULIN DETEMIR 100 UNIT/ML ~~LOC~~ SOLN: 150.0000 [IU] | Freq: Every day | SUBCUTANEOUS | 11 refills | Status: DC

## 2017-01-21 NOTE — Patient Instructions (Signed)
For now, please: Stop using the V-GO, and: Increase the levemir to 150 units each morning, and: Please call or message [place] in a few days, to tell us how the blood sugar is doing. check your blood sugar twice a day.  vary the time of day when you check, between before the 3 meals, and at bedtime.  also check if you have symptoms of your blood sugar being too high or too low.  please keep a record of the readings and bring it to your next appointment here.  You can write it on any piece of paper.  please call us sooner if your blood sugar goes below 70, or if you have a lot of readings over 200. Please come back for a follow-up appointment in 1 month.

## 2017-01-21 NOTE — Progress Notes (Signed)
Subjective:    Patient ID: Jeff [name], male    DOB: 1950-11-12, 66 y.o.   MRN: [id number]  HPI Pt returns for f/u of diabetes mellitus: DM type: Insulin-requiring type 2 Dx'ed: 2005 Complications: painful polyneuropathy and renal insufficiency.  Therapy: insulin since 2016, and trulicity.  DKA: never Severe hypoglycemia: never.   Pancreatitis: never.  Other: he started V-GO-40 pump in early 2016.   Interval history: no recent steroids. He now takes V-GO-40 at max dosage (78 units/day), and levemir 30 units QHS.  no cbg record, but states cbg's are in the 200's-300's.  It is in general higher as the day goes on.   Past Medical History:  Diagnosis Date  . ADHD (attention deficit hyperactivity disorder)   . Diabetes mellitus   . Hyperlipidemia   . Hypertension     Past Surgical History:  Procedure Laterality Date  . NECK SURGERY    . SPINE SURGERY     Cervical spine x 2; [employer]; [employer].    Social History   Social History  . Marital status: Single    Spouse name: N/A  . Number of children: N/A  . Years of education: N/A   Occupational History  . Archivist    Social History Main Topics  . Smoking status: Never Smoker  . Smokeless tobacco: Never Used  . Alcohol use No  . Drug use: No  . Sexual activity: Yes   Other Topics Concern  . Not on file   Social History Narrative  . No narrative on file    Current Outpatient Prescriptions on File Prior to Visit  Medication Sig Dispense Refill  . glucose blood (ONETOUCH VERIO) test strip 1 each by Other route 2 (two) times daily. And lancets 2/day 100 each 12  . Insulin Pen Needle (BD PEN NEEDLE NANO U/F) 32G X 4 MM MISC Use as directed. 200 each 6  . lisdexamfetamine (VYVANSE) 70 MG capsule Take 1 capsule (70 mg total) by mouth daily. Fill 60 days after signed 30 capsule 0  . lisinopril (PRINIVIL,ZESTRIL) 10 MG tablet TAKE 1 TABLET(10 MG) BY MOUTH DAILY 90 tablet 0  . metFORMIN (GLUCOPHAGE)  1000 MG tablet Take 1 tablet (1,000 mg total) by mouth 2 (two) times daily with a meal. 180 tablet 1  . rosuvastatin (CRESTOR) 10 MG tablet Take 1 tablet (10 mg total) by mouth daily. 90 tablet 3  . TRULICITY 1.5 MG/0.5ML SOPN INJECT 1.5MG  INTO THE SKIN ONCE A WEEK AS DIRECTED 2 mL 0   Current Facility-Administered Medications on File Prior to Visit  Medication Dose Route Frequency Provider Last Rate Last Dose  . sodium chloride 0.9 % bolus 1,000 mL  1,000 mL Intravenous Once Ofilia Neas, PA-C        Allergies  Allergen Reactions  . Flexeril [Cyclobenzaprine Hcl] Other (See Comments)    Causes him to pass out and lowers his BP  . Lidocaine Swelling    throat  . Novocain [Procaine Hcl] Swelling    Family History  Problem Relation Age of Onset  . Heart disease Mother        CHF  . Diabetes Mother   . Heart disease Father   . Cancer Brother     BP 130/84   Pulse 86   Wt 180 lb 9.6 oz (81.9 kg)   SpO2 95%   BMI 27.46 kg/m    Review of Systems He denies hypoglycemia.  No weight change.  He has blurry  vision (saw opthal).      Objective:   Physical Exam VITAL SIGNS:  See vs page GENERAL: no distress Pulses: foot pulses are intact bilaterally.   MSK: no deformity of the feet or ankles.  CV: no edema of the legs or ankles Skin:  no ulcer on the feet or ankles.  normal color and temp on the feet and ankles Neuro: sensation is intact to touch on the feet and ankles.   Ext: There is bilateral onychomycosis of the toenails.   Lab Results  Component Value Date   HGBA1C 14.0 01/10/2017      Assessment & Plan:  Insulin-requiring type 2 DM, with polyneuropathy: much worse.  He declines multiple daily injections.  Renal insuff: in this setting, he is at risk for hypoglycemia in the early hrs of the AM.   Patient Instructions  For now, please: Stop using the V-GO, and: Increase the levemir to 150 units each morning, and: Please call or message [place] in a few days, to  tell us how the blood sugar is doing. check your blood sugar twice a day.  vary the time of day when you check, between before the 3 meals, and at bedtime.  also check if you have symptoms of your blood sugar being too high or too low.  please keep a record of the readings and bring it to your next appointment here.  You can write it on any piece of paper.  please call us sooner if your blood sugar goes below 70, or if you have a lot of readings over 200. Please come back for a follow-up appointment in 1 month.

## 2017-01-23 ENCOUNTER — Ambulatory Visit (INDEPENDENT_AMBULATORY_CARE_PROVIDER_SITE_OTHER): Payer: BC Managed Care – PPO | Admitting: Physician Assistant

## 2017-01-23 ENCOUNTER — Encounter: Payer: Self-pay | Admitting: Physician Assistant

## 2017-01-23 VITALS — BP 114/76 | HR 91 | Temp 98.1°F | Resp 18 | Ht 68.0 in | Wt 181.6 lb

## 2017-01-23 DIAGNOSIS — R739 Hyperglycemia, unspecified: Secondary | ICD-10-CM | POA: Diagnosis not present

## 2017-01-23 LAB — POCT URINALYSIS DIP (MANUAL ENTRY)
BILIRUBIN UA: NEGATIVE
Blood, UA: NEGATIVE
GLUCOSE UA: NEGATIVE mg/dL
Ketones, POC UA: NEGATIVE mg/dL
LEUKOCYTES UA: NEGATIVE
NITRITE UA: NEGATIVE
PH UA: 5.5 (ref 5.0–8.0)
Protein Ur, POC: NEGATIVE mg/dL
Spec Grav, UA: 1.015 (ref 1.010–1.025)
Urobilinogen, UA: 0.2 E.U./dL

## 2017-01-23 LAB — GLUCOSE, POCT (MANUAL RESULT ENTRY): POC Glucose: 244 mg/dl — AB (ref 70–99)

## 2017-01-23 NOTE — Patient Instructions (Signed)
     IF you received an x-ray today, you will receive an invoice from [facility]. Please contact [facility] at [phone] with questions or concerns regarding your invoice.   IF you received labwork today, you will receive an invoice from [facility]. Please contact [facility] at [phone] with questions or concerns regarding your invoice.   Our billing staff will not be able to assist you with questions regarding bills from these companies.  You will be contacted with the lab results as soon as they are available. The fastest way to get your results is to activate your My Chart account. Instructions are located on the last page of this paperwork. If you have not heard from us regarding the results in 2 weeks, please contact this office.     

## 2017-01-23 NOTE — Progress Notes (Signed)
01/23/2017 2:07 PM   DOB: 1950-11-29 / MRN: [id number]  SUBJECTIVE:  Jeff [name] is a 66 y.o. male presenting for hyperglycemia recheck.  Tells me he still feels exhausted.  He did see Dr. Everardo All a few days ago and his V-go was stopped and his Levemir was pushed up to 150 mg qhs.   He is allergic to flexeril [cyclobenzaprine hcl]; lidocaine; and novocain [procaine hcl].   He  has a past medical history of ADHD (attention deficit hyperactivity disorder); Diabetes mellitus; Hyperlipidemia; and Hypertension.    He  reports that he has never smoked. He has never used smokeless tobacco. He reports that he does not drink alcohol or use drugs. He  reports that he currently engages in sexual activity. The patient  has a past surgical history that includes Neck surgery and Spine surgery.  His family history includes Cancer in his brother; Diabetes in his mother; Heart disease in his father and mother.  Review of Systems  Constitutional: Negative for chills, diaphoresis and fever.  Eyes: Negative.   Respiratory: Negative for cough, hemoptysis, sputum production, shortness of breath and wheezing.   Cardiovascular: Negative for chest pain, orthopnea and leg swelling.  Gastrointestinal: Negative for abdominal pain, blood in stool, constipation, diarrhea, heartburn, melena, nausea and vomiting.  Genitourinary: Negative for flank pain.  Skin: Negative for rash.  Neurological: Negative for dizziness, sensory change, speech change, focal weakness and headaches.    The problem list and medications were reviewed and updated by myself where necessary and exist elsewhere in the encounter.   OBJECTIVE:  BP 114/76   Pulse 91   Temp 98.1 F (36.7 C) (Oral)   Resp 18   Ht 5\' 8"  (1.727 m)   Wt 181 lb 9.6 oz (82.4 kg)   SpO2 94%   BMI 27.61 kg/m   Wt Readings from Last 3 Encounters:  01/23/17 181 lb 9.6 oz (82.4 kg)  01/21/17 180 lb 9.6 oz (81.9 kg)  01/16/17 182 lb (82.6 kg)   BP Readings from  Last 3 Encounters:  01/23/17 114/76  01/21/17 130/84  01/16/17 111/73   Pulse Readings from Last 3 Encounters:  01/23/17 91  01/21/17 86  01/16/17 (!) 101   Lab Results  Component Value Date   CREATININE 1.30 (H) 01/16/2017       Physical Exam  Constitutional: He appears well-developed. He is active and cooperative.  Non-toxic appearance.  Cardiovascular: Normal rate, regular rhythm, S1 normal, S2 normal, normal heart sounds, intact distal pulses and normal pulses.  Exam reveals no gallop and no friction rub.   No murmur heard. Pulmonary/Chest: Effort normal. No stridor. No tachypnea. No respiratory distress. He has no wheezes. He has no rales.  Abdominal: He exhibits no distension.  Musculoskeletal: He exhibits no edema.  Neurological: He is alert.  Skin: Skin is warm and dry. He is not diaphoretic. No pallor.  Vitals reviewed.   Results for orders placed or performed in visit on 01/23/17 (from the past 72 hour(s))  POCT glucose (manual entry)     Status: Abnormal   Collection Time: 01/23/17  1:56 PM  Result Value Ref Range   POC Glucose 244 (A) 70 - 99 mg/dl  POCT urinalysis dipstick     Status: None   Collection Time: 01/23/17  2:04 PM  Result Value Ref Range   Color, UA yellow yellow   Clarity, UA clear clear   Glucose, UA negative negative mg/dL   Bilirubin, UA negative negative  Ketones, POC UA negative negative mg/dL   Spec Grav, UA 1.610 9.604 - 1.025   Blood, UA negative negative   pH, UA 5.5 5.0 - 8.0   Protein Ur, POC negative negative mg/dL   Urobilinogen, UA 0.2 0.2 or 1.0 E.U./dL   Nitrite, UA Negative Negative   Leukocytes, UA Negative Negative    No results found.  ASSESSMENT AND PLAN:  [name] was seen today for diabetes.  Diagnoses and all orders for this visit:  Hyperglycemia: He is back with Dr. Everardo All in Endo.  I expect he will be feeling better soon.  -     POCT glucose (manual entry) -     POCT urinalysis dipstick    The patient  is advised to call or return to clinic if he does not see an improvement in symptoms, or to seek the care of the closest emergency department if he worsens with the above plan.   [name], MHS, PA-C Primary Care at [facility] 01/23/2017 2:07 PM

## 2017-01-28 ENCOUNTER — Telehealth: Payer: Self-pay | Admitting: Endocrinology

## 2017-01-28 MED ORDER — INSULIN ISOPHANE HUMAN 100 UNIT/ML KWIKPEN: 120.0000 [IU] | PEN_INJECTOR | SUBCUTANEOUS | 11 refills | Status: DC

## 2017-01-28 NOTE — Telephone Encounter (Signed)
Patient would like call back on cell phone.

## 2017-01-28 NOTE — Telephone Encounter (Signed)
Ok, the pattern of blood sugar tells [place] that you need a faster-acting QD insulin.  I have sent a prescription to your pharmacy.  It is not a unit for unit transition, so we have to start at 120 units qam.

## 2017-01-28 NOTE — Telephone Encounter (Signed)
Called [name] & also wanted to know if you are the one that should fill out the FMLA forms for his work? He is concerned about his blurry vision & diabetic retinopathy.

## 2017-01-28 NOTE — Telephone Encounter (Signed)
What would the dosage of Levemir be? He was wanting that increased because of the constant high readings?

## 2017-01-28 NOTE — Telephone Encounter (Signed)
Patient's lowest and highest blood sugar numbers:   08/20: 319  8/21: 77 and 198   8/22: 172 at 9am and 266 at 9pm  8/23: 103 at 6pm and 346pm  8/24:130 at 7am and 258 at 6pm  8/25:153 at 4pm and 288 at 11am  8/26:196 at 7am and 217 at 5pm  8/27: 328 at 9am  Patient has done well with watching his diet and has been watching his carbs. Call patient to advise at [phone] as soon as possible due to blood sugars being near/in the 300's.

## 2017-01-28 NOTE — Telephone Encounter (Signed)
Would it be possible for him to use up his Levemir insulin before picking up the Humulin N in a higher dose? He doesn't want to waste all that money.

## 2017-01-28 NOTE — Telephone Encounter (Signed)
I would like to increase, but we can't, as fasting cbg's are low.

## 2017-01-28 NOTE — Telephone Encounter (Signed)
If about vision, FMLA would go to eye dr ok to use up levemir

## 2017-01-29 ENCOUNTER — Other Ambulatory Visit: Payer: Self-pay

## 2017-01-29 MED ORDER — INSULIN NPH (HUMAN) (ISOPHANE) 100 UNIT/ML ~~LOC~~ SUSP: 120.0000 [IU] | Freq: Every morning | SUBCUTANEOUS | 11 refills | Status: DC

## 2017-01-29 NOTE — Telephone Encounter (Signed)
Patient called back & stated that his paperwork needing to be filled out was directly related to his diabetes. He is going to bring that in to be filled out. He also stated that Novolin N is what's covered under his insurance so is that ok to switch?

## 2017-01-29 NOTE — Telephone Encounter (Signed)
novolin N is OK 

## 2017-01-29 NOTE — Telephone Encounter (Signed)
Sent in

## 2017-01-29 NOTE — Telephone Encounter (Signed)
Called patient & left VM. Notified him that fasting CBG's are too low to increase Levemir. Also told him that he should give paperwork to eye doctor if it is directly related to his eyes. If he had anymore concerns to call back.

## 2017-01-30 ENCOUNTER — Other Ambulatory Visit: Payer: Self-pay

## 2017-01-30 MED ORDER — "INSULIN SYRINGE-NEEDLE U-100 31G X 5/16"" 1 ML MISC": 2 refills | Status: DC

## 2017-01-30 NOTE — Telephone Encounter (Signed)
I spoke with patient he still wants to know if he can use the Levemir instead. That's what his insurance covers. He would like to do 150 units in the morning & 25 at night. If that got his blood sugars down then he wanted to stay on that before picking up the NPH. What dosage would be best for him?

## 2017-01-30 NOTE — Telephone Encounter (Signed)
Pt takes NPH qam.  Either novolin or humulin are OK (novolog is a different medication--he is not on this).

## 2017-01-30 NOTE — Telephone Encounter (Signed)
Routing to you °

## 2017-01-30 NOTE — Telephone Encounter (Signed)
When you took levemir 150 units qam, cbg's were low in th morning, and high in the evening. Taking some in the evening would worsen this.  We were unable to increase, due to the lingering effect of this the next morning.  This is why I suggested changing to NPH.

## 2017-01-30 NOTE — Telephone Encounter (Signed)
Patient needs the medication to be in pen form due to patient not having syringes and not being comfortable using syringes. Patient would like medication to be changed to Novolog per pharmacy instructions.  [facility] [id number] - [place] - [id number] AT [facility] [address] [phone] (Phone) [phone] (Fax)   Call pharmacy to clarify and get the medication changed.

## 2017-01-30 NOTE — Telephone Encounter (Signed)
Notified patient & he agreed to take the NPH. I also sent in a prescription to pharmacy for syringes.

## 2017-02-06 ENCOUNTER — Encounter: Payer: Self-pay | Admitting: Physician Assistant

## 2017-02-06 ENCOUNTER — Ambulatory Visit (INDEPENDENT_AMBULATORY_CARE_PROVIDER_SITE_OTHER): Payer: BC Managed Care – PPO | Admitting: Physician Assistant

## 2017-02-06 VITALS — BP 108/68 | HR 95 | Temp 98.3°F | Resp 17 | Ht 68.0 in | Wt 189.0 lb

## 2017-02-06 DIAGNOSIS — R739 Hyperglycemia, unspecified: Secondary | ICD-10-CM | POA: Diagnosis not present

## 2017-02-06 LAB — POCT CBG (FASTING - GLUCOSE)-MANUAL ENTRY: GLUCOSE FASTING, POC: 72 mg/dL (ref 70–99)

## 2017-02-06 NOTE — Patient Instructions (Signed)
     IF you received an x-ray today, you will receive an invoice from [facility]. Please contact  Radiology at [phone] with questions or concerns regarding your invoice.   IF you received labwork today, you will receive an invoice from [facility]. Please contact [facility] at [phone] with questions or concerns regarding your invoice.   Our billing staff will not be able to assist you with questions regarding bills from these companies.  You will be contacted with the lab results as soon as they are available. The fastest way to get your results is to activate your My Chart account. Instructions are located on the last page of this paperwork. If you have not heard from us regarding the results in 2 weeks, please contact this office.     

## 2017-02-06 NOTE — Progress Notes (Signed)
    02/13/2017 1:44 PM   DOB: [birth year] / MRN: [id number]  SUBJECTIVE:  Jeff [name] is a 66 y.o. male presenting for recheck of glucose and FMLA paperwork.  Tell me that he is mostly feeling better. He is taking Novolin 120 unit. Is seeing Dr. Dione BoozeGroat for a change in his peripheral vision and tells me Dr. Dione BoozeGroat feels this will get better.   He is allergic to flexeril [cyclobenzaprine hcl]; lidocaine; and novocain [procaine hcl].   He  has a past medical history of ADHD (attention deficit hyperactivity disorder); Diabetes mellitus; Hyperlipidemia; and Hypertension.    He  reports that he has never smoked. He has never used smokeless tobacco. He reports that he does not drink alcohol or use drugs. He  reports that he currently engages in sexual activity. The patient  has a past surgical history that includes Neck surgery and Spine surgery.  His family history includes Cancer in his brother; Diabetes in his mother; Heart disease in his father and mother.  Review of Systems  Constitutional: Negative for chills, diaphoresis and fever.  Eyes: Positive for blurred vision.  Respiratory: Negative for cough, hemoptysis, sputum production, shortness of breath and wheezing.   Cardiovascular: Negative for chest pain, orthopnea and leg swelling.  Gastrointestinal: Negative for nausea.  Skin: Negative for rash.  Neurological: Negative for dizziness, sensory change, speech change, focal weakness and headaches.    The problem list and medications were reviewed and updated by myself where necessary and exist elsewhere in the encounter.   OBJECTIVE:  BP 108/68   Pulse 95   Temp 98.3 F (36.8 C) (Oral)   Resp 17   Ht 5\' 8"  (1.727 m)   Wt 189 lb (85.7 kg)   SpO2 98%   BMI 28.74 kg/m   Vision Screening Comments: Horizontal Field R: 55, L: 70   Physical Exam  Constitutional: He is oriented to person, place, and time.  Cardiovascular: Normal rate and regular rhythm.   Pulmonary/Chest: Effort normal  and breath sounds normal.  Musculoskeletal: Normal range of motion.  Neurological: He is alert and oriented to person, place, and time.    No results found for this or any previous visit (from the past 72 hour(s)).  No results found.  ASSESSMENT AND PLAN:  Jeff [name] was seen today for follow-up.  Diagnoses and all orders for this visit:  Hyperglycemia Glucose improving and he is working with Dr. Everardo AllEllison.  He has a visual field deficit and is being followed by Dr. Dione BoozeGroat.  Will pan to keep him out of work, as he must drive for work, until his vision improves.   -     POCT CBG (Fasting - Glucose)    The patient is advised to call or return to clinic if he does not see an improvement in symptoms, or to seek the care of the closest emergency department if he worsens with the above plan.   Deliah BostonMichael Celines [name], MHS, PA-C Primary Care at [facility] 02/13/2017 1:44 PM

## 2017-02-15 ENCOUNTER — Encounter: Payer: Self-pay | Admitting: Endocrinology

## 2017-02-15 ENCOUNTER — Ambulatory Visit (INDEPENDENT_AMBULATORY_CARE_PROVIDER_SITE_OTHER): Payer: BC Managed Care – PPO | Admitting: Endocrinology

## 2017-02-15 VITALS — BP 112/72 | HR 77 | Wt 187.2 lb

## 2017-02-15 DIAGNOSIS — N183 Chronic kidney disease, stage 3 (moderate): Secondary | ICD-10-CM

## 2017-02-15 DIAGNOSIS — E1122 Type 2 diabetes mellitus with diabetic chronic kidney disease: Secondary | ICD-10-CM

## 2017-02-15 DIAGNOSIS — Z794 Long term (current) use of insulin: Secondary | ICD-10-CM | POA: Diagnosis not present

## 2017-02-15 MED ORDER — INSULIN NPH (HUMAN) (ISOPHANE) 100 UNIT/ML ~~LOC~~ SUSP: 110.0000 [IU] | Freq: Every morning | SUBCUTANEOUS | 11 refills | Status: DC

## 2017-02-15 NOTE — Patient Instructions (Addendum)
Please reduce the NPH to 110 units each morning, and:  check your blood sugar twice a day.  vary the time of day when you check, between before the 3 meals, and at bedtime.  also check if you have symptoms of your blood sugar being too high or too low.  please keep a record of the readings and bring it to your next appointment here.  You can write it on any piece of paper.  please call us sooner if your blood sugar goes below 70, or if you have a lot of readings over 200. Please come back for a follow-up appointment in 2 months

## 2017-02-15 NOTE — Progress Notes (Signed)
Subjective:    Patient ID: Jeff [name], male    DOB: [birth year], 66 y.o.   MRN: [id number]  HPI Pt returns for f/u of diabetes mellitus: DM type: Insulin-requiring type 2 Dx'ed: 2005 Complications: painful polyneuropathy and renal insufficiency.  Therapy: insulin since 2016, and trulicity.  DKA: never.   Severe hypoglycemia: never.   Pancreatitis: never.  Other: he used V-GO-40 pump 2016-2018--he stopped due to high dosage requirement; he declines multiple daily injections; he takes NPH qam, due to pattern of cbg's.  Interval history: Meter is downloaded today, and the printout is scanned into the record.  It varies from 70-200's.  There is no trend throughout the day.  pt states he feels well in general.    Past Medical History:  Diagnosis Date  . ADHD (attention deficit hyperactivity disorder)   . Diabetes mellitus   . Hyperlipidemia   . Hypertension     Past Surgical History:  Procedure Laterality Date  . NECK SURGERY    . SPINE SURGERY     Cervical spine x 2; [employer]; [employer].    Social History   Social History  . Marital status: Single    Spouse name: N/A  . Number of children: N/A  . Years of education: N/A   Occupational History  . Archivist    Social History Main Topics  . Smoking status: Never Smoker  . Smokeless tobacco: Never Used  . Alcohol use No  . Drug use: No  . Sexual activity: Yes   Other Topics Concern  . Not on file   Social History Narrative  . No narrative on file    Current Outpatient Prescriptions on File Prior to Visit  Medication Sig Dispense Refill  . glucose blood (ONETOUCH VERIO) test strip 1 each by Other route 2 (two) times daily. And lancets 2/day 100 each 12  . Insulin Pen Needle (BD PEN NEEDLE NANO U/F) 32G X 4 MM MISC Use as directed. (Patient not taking: Reported on 02/06/2017) 200 each 6  . Insulin Syringe-Needle U-100 (B-D INS SYR ULTRAFINE 1CC/31G) 31G X 5/16" 1 ML MISC Used to inject insulin  twice daily. 90 each 2  . lisdexamfetamine (VYVANSE) 70 MG capsule Take 1 capsule (70 mg total) by mouth daily. Fill 60 days after signed 30 capsule 0  . lisinopril (PRINIVIL,ZESTRIL) 10 MG tablet TAKE 1 TABLET(10 MG) BY MOUTH DAILY 90 tablet 0  . metFORMIN (GLUCOPHAGE) 1000 MG tablet Take 1 tablet (1,000 mg total) by mouth 2 (two) times daily with a meal. 180 tablet 1  . rosuvastatin (CRESTOR) 10 MG tablet Take 1 tablet (10 mg total) by mouth daily. 90 tablet 3   Current Facility-Administered Medications on File Prior to Visit  Medication Dose Route Frequency Provider Last Rate Last Dose  . sodium chloride 0.9 % bolus 1,000 mL  1,000 mL Intravenous Once Ofilia Neas, PA-C        Allergies  Allergen Reactions  . Flexeril [Cyclobenzaprine Hcl] Other (See Comments)    Causes him to pass out and lowers his BP  . Lidocaine Swelling    throat  . Novocain [Procaine Hcl] Swelling    Family History  Problem Relation Age of Onset  . Heart disease Mother        CHF  . Diabetes Mother   . Heart disease Father   . Cancer Brother    BP 112/72   Pulse 77   Wt 187 lb 3.2 oz (84.9 kg)  SpO2 97%   BMI 28.46 kg/m   Review of Systems He denies hypoglycemia    Objective:   Physical Exam VITAL SIGNS:  See vs page GENERAL: no distress Pulses: dorsalis pedis intact bilat.   MSK: no deformity of the feet CV: no leg edema Skin:  no ulcer on the feet.  normal color and temp on the feet. Neuro: sensation is intact to touch on the feet Ext: There is bilateral onychomycosis of the toenails      Assessment & Plan:  Insulin-requiring type 2 DM, with renal insufficiency: apparently overcontrolled.   Patient Instructions  Please reduce the NPH to 110 units each morning, and:  check your blood sugar twice a day.  vary the time of day when you check, between before the 3 meals, and at bedtime.  also check if you have symptoms of your blood sugar being too high or too low.  please keep a  record of the readings and bring it to your next appointment here.  You can write it on any piece of paper.  please call us sooner if your blood sugar goes below 70, or if you have a lot of readings over 200. Please come back for a follow-up appointment in 2 months

## 2017-02-16 ENCOUNTER — Other Ambulatory Visit: Payer: Self-pay | Admitting: Endocrinology

## 2017-02-21 ENCOUNTER — Telehealth: Payer: Self-pay | Admitting: Endocrinology

## 2017-02-21 NOTE — Telephone Encounter (Signed)
Patient states that he doesn't think his blood sugar is being controlled well with the decrease in his directions on Novolin N. Patient would like to discuss. Please call.  Recent glucose readings  09/16 1:47pm(after lunch) 190 09/16 11:20 pm- 139 09/17 at 12:31 pm (before lunch) 116 09/17 at 8:185(after supper) 369 09/17 at 11:36 pm- 129 09/18 at 8:17 am-108 09/18- 2:13 pm- 188 09/19 at 9:11 am- 155 09/19 at 9:21 pm- 191 09/20 at 8:42(waking up before breakfast) 156 09/20 at 11 am - 156  Also, has a question about Levimer. He states that the pharmacy told him it was ready. He wants to clarifiy that it was discontinued.

## 2017-02-21 NOTE — Telephone Encounter (Signed)
Notified patient.

## 2017-02-21 NOTE — Telephone Encounter (Signed)
Ok, Please continue the same insulin for now.  Also, you are right--you are off the levemir. I hope you feel well.

## 2017-02-21 NOTE — Telephone Encounter (Signed)
Please advise 

## 2017-02-25 ENCOUNTER — Encounter: Payer: Self-pay | Admitting: Physician Assistant

## 2017-02-25 ENCOUNTER — Ambulatory Visit (INDEPENDENT_AMBULATORY_CARE_PROVIDER_SITE_OTHER): Payer: BC Managed Care – PPO | Admitting: Physician Assistant

## 2017-02-25 VITALS — BP 104/70 | HR 96 | Temp 98.7°F | Resp 16 | Ht 68.0 in | Wt 187.0 lb

## 2017-02-25 DIAGNOSIS — Z23 Encounter for immunization: Secondary | ICD-10-CM | POA: Diagnosis not present

## 2017-02-25 DIAGNOSIS — H53452 Other localized visual field defect, left eye: Secondary | ICD-10-CM | POA: Diagnosis not present

## 2017-02-25 NOTE — Patient Instructions (Signed)
I prescribe you your lisinopril, Crestor and vyvanse.

## 2017-02-25 NOTE — Progress Notes (Signed)
    02/25/2017 3:33 PM   DOB: 1951-02-13 / MRN: [id number]  SUBJECTIVE:  Jeff [name] is a 66 y.o. male presenting for recheck.  He tells me he plans to work until he is 69.  Has an MRI from earlier in the year that is normal. Tells me his is still not comfortable driving and his peripheral vision has still not improved.  Dr. Dione Booze is seeing him and does feel taht his vision will improve.  He is currently out on FMLA.  He is currenlty taking ASA and crestor.   Immunization History  Administered Date(s) Administered  . Influenza, Seasonal, Injecte, Preservative Fre 03/09/2013  . Influenza,inj,Quad PF,6+ Mos 05/31/2016  . Pneumococcal Conjugate-13 10/02/2014  . Pneumococcal Polysaccharide-23 05/31/2016  . Tdap 03/09/2013  . Zoster 06/04/2014     He is allergic to flexeril [cyclobenzaprine hcl]; lidocaine; and novocain [procaine hcl].   He  has a past medical history of ADHD (attention deficit hyperactivity disorder); Diabetes mellitus; Hyperlipidemia; and Hypertension.    He  reports that he has never smoked. He has never used smokeless tobacco. He reports that he does not drink alcohol or use drugs. He  reports that he currently engages in sexual activity. The patient  has a past surgical history that includes Neck surgery and Spine surgery.  His family history includes Cancer in his brother; Diabetes in his mother; Heart disease in his father and mother.  Review of Systems  Constitutional: Negative for chills, diaphoresis and fever.  Respiratory: Negative for shortness of breath.   Cardiovascular: Negative for chest pain, orthopnea and leg swelling.  Gastrointestinal: Negative for nausea.  Skin: Negative for rash.  Neurological: Negative for dizziness.    The problem list and medications were reviewed and updated by myself where necessary and exist elsewhere in the encounter.   OBJECTIVE:  BP 104/70 (BP Location: Right Arm, Patient Position: Sitting, Cuff Size: Large)   Pulse 96    Temp 98.7 F (37.1 C) (Oral)   Resp 16   Ht  (1.727 m)   Wt 187 lb (84.8 kg)   SpO2 97%   BMI 28.43 kg/m   Physical Exam  Constitutional: He appears well-developed. He is active and cooperative.  Non-toxic appearance.  Cardiovascular: Normal rate.   Pulmonary/Chest: Effort normal. No tachypnea.  Neurological: He is alert.  Skin: Skin is warm and dry. He is not diaphoretic. No pallor.  Vitals reviewed.   No results found for this or any previous visit (from the past 72 hour(s)).  No results found.  Vision Screening Comments: Peripheral Vision: Right eye 70 degrees. Left eye 55 degrees.   ASSESSMENT AND PLAN:  Rondo was seen today for follow-up.  Diagnoses and all orders for this visit:  Peripheral vision loss, left: THis continues.  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   Advised that he keep his interval follow ups with Dr. Dione Booze.     The patient is advised to call or return to clinic if he does not see an improvement in symptoms, or to seek the care of the closest emergency department if he worsens with the above plan.   [name], MHS, PA-C Primary Care at [facility] 02/25/2017 3:33 PM

## 2017-03-20 ENCOUNTER — Other Ambulatory Visit: Payer: Self-pay | Admitting: Endocrinology

## 2017-04-01 ENCOUNTER — Encounter: Payer: Self-pay | Admitting: Physician Assistant

## 2017-04-01 ENCOUNTER — Ambulatory Visit (INDEPENDENT_AMBULATORY_CARE_PROVIDER_SITE_OTHER): Payer: BC Managed Care – PPO | Admitting: Physician Assistant

## 2017-04-01 VITALS — BP 110/66 | HR 83 | Temp 97.5°F | Resp 18 | Ht 68.0 in | Wt 186.6 lb

## 2017-04-01 DIAGNOSIS — Z9189 Other specified personal risk factors, not elsewhere classified: Secondary | ICD-10-CM | POA: Diagnosis not present

## 2017-04-01 DIAGNOSIS — I10 Essential (primary) hypertension: Secondary | ICD-10-CM

## 2017-04-01 DIAGNOSIS — Z23 Encounter for immunization: Secondary | ICD-10-CM

## 2017-04-01 DIAGNOSIS — H5450 Low vision, one eye, unspecified eye: Secondary | ICD-10-CM

## 2017-04-01 MED ORDER — ZOSTER VAC RECOMB ADJUVANTED 50 MCG/0.5ML IM SUSR: 0.5000 mL | Freq: Once | INTRAMUSCULAR | 1 refills | Status: DC

## 2017-04-01 MED ORDER — ROSUVASTATIN CALCIUM 10 MG PO TABS: 10.0000 mg | ORAL_TABLET | Freq: Every day | ORAL | 3 refills | Status: DC

## 2017-04-01 MED ORDER — LISINOPRIL 10 MG PO TABS: ORAL_TABLET | ORAL | 0 refills | Status: DC

## 2017-04-01 MED ORDER — ASPIRIN EC 81 MG PO TBEC: 81.0000 mg | DELAYED_RELEASE_TABLET | Freq: Every day | ORAL | 0 refills | Status: DC

## 2017-04-01 MED ORDER — ZOSTER VAC RECOMB ADJUVANTED 50 MCG/0.5ML IM SUSR: 0.5000 mL | Freq: Once | INTRAMUSCULAR | 1 refills | Status: AC

## 2017-04-01 NOTE — Progress Notes (Signed)
04/05/2017 6:34 PM   DOB: 01/02/1951 / MRN: [id number]  SUBJECTIVE:  Jeff [name] is a 66 y.o. male presenting for follow up of FMLA.  Tells me that he has been to the [facility] for care and tells me that he will be able to keep [facility].    Has been seeing a new opthalmologist and tells me that bleeding was found in his eye. He is prescribed  pred eye drops. He will also be getting new glasses. Tells me he will be out on FMLA and personal leave until January per her new opthalmologist. She expects his peripheral vision to return to normal in time.   Immunization History  Administered Date(s) Administered  . Influenza, Seasonal, Injecte, Preservative Fre 03/09/2013  . Influenza,inj,Quad PF,6+ Mos 05/31/2016, 02/25/2017  . Pneumococcal Conjugate-13 10/02/2014  . Pneumococcal Polysaccharide-23 05/31/2016  . Tdap 03/09/2013  . Zoster 06/04/2014     He is allergic to flexeril [cyclobenzaprine hcl]; lidocaine; and novocain [procaine hcl].   He  has a past medical history of ADHD (attention deficit hyperactivity disorder); Diabetes mellitus; Hyperlipidemia; and Hypertension.    He  reports that he has never smoked. He has never used smokeless tobacco. He reports that he does not drink alcohol or use drugs. He  reports that he currently engages in sexual activity. The patient  has a past surgical history that includes Neck surgery and Spine surgery.  His family history includes Cancer in his brother; Diabetes in his mother; Heart disease in his father and mother.  Review of Systems  Constitutional: Negative for chills, diaphoresis and fever.  Eyes: Negative.   Respiratory: Negative for cough, hemoptysis, sputum production, shortness of breath and wheezing.   Cardiovascular: Negative for chest pain, orthopnea and leg swelling.  Gastrointestinal: Negative for nausea.  Skin: Negative for rash.  Neurological: Negative for dizziness, sensory change, speech change, focal weakness  and headaches.    The problem list and medications were reviewed and updated by myself where necessary and exist elsewhere in the encounter.   OBJECTIVE:  BP 110/66   Pulse 83   Temp (!) 97.5 F (36.4 C) (Oral)   Resp 18   Ht 5\' 8"  (1.727 m)   Wt 186 lb 9.6 oz (84.6 kg)   SpO2 96%   BMI 28.37 kg/m         Physical Exam  Constitutional: He appears well-developed. He is active and cooperative.  Non-toxic appearance.  Cardiovascular: Normal rate, regular rhythm, S1 normal, S2 normal, normal heart sounds, intact distal pulses and normal pulses.  Exam reveals no gallop and no friction rub.   No murmur heard. Pulmonary/Chest: Effort normal. No stridor. No tachypnea. No respiratory distress. He has no wheezes. He has no rales.  Abdominal: He exhibits no distension.  Musculoskeletal: He exhibits no edema, tenderness or deformity.  Neurological: He is alert.  Skin: Skin is warm and dry. He is not diaphoretic. No pallor.  Vitals reviewed.   Lab Results  Component Value Date   WBC 9.6 01/16/2017   HGB 14.5 01/16/2017   HCT 42.3 (A) 01/16/2017   MCV 86.1 01/16/2017   PLT 273 05/31/2016    Lab Results  Component Value Date   CREATININE 1.30 (H) 01/16/2017   BUN 27 01/16/2017   NA 137 01/16/2017   K 4.6 01/16/2017   CL 98 01/16/2017   CO2 20 01/16/2017    Lab Results  Component Value Date   ALT 37 01/16/2017  AST 31 01/16/2017   ALKPHOS 66 01/16/2017   BILITOT <0.2 01/16/2017    Lab Results  Component Value Date   TSH 1.915 02/27/2014    Lab Results  Component Value Date   HGBA1C 10.8 01/21/2017    Lab Results  Component Value Date   CHOL 176 08/17/2015   HDL 47 08/17/2015   LDLCALC 96 08/17/2015   TRIG 165 (H) 08/17/2015   CHOLHDL 3.7 08/17/2015     No results found for this or any previous visit (from the past 72 hour(s)).  No results found.  ASSESSMENT AND PLAN:  [name] was seen today for follow-up.  Diagnoses and all orders for this  visit:  Low vision, one eye: I am happy to here that his new ophthalmologist feels that his vision will return. Will keep him out of work as he is a danger to himself and others while driving.  His spouse is here and has been driving him to his appointments.    Need for shingles vaccine -     Zoster Vaccine Adjuvanted [facility]) injection; Inject 0.5 mLs into the muscle once. Booster due at 6 months  Essential hypertension -     lisinopril (PRINIVIL,ZESTRIL) 10 MG tablet; TAKE 1 TABLET(10 MG) BY MOUTH DAILY  At risk for acute ischemic cardiac event -     aspirin EC 81 MG tablet; Take 1 tablet (81 mg total) by mouth daily. -     rosuvastatin (CRESTOR) 10 MG tablet; Take 1 tablet (10 mg total) by mouth daily.  Other orders -     Discontinue: Zoster Vaccine Adjuvanted [facility]) injection; Inject 0.5 mLs into the muscle once. Booster due at 6 months    The patient is advised to call or return to clinic if he does not see an improvement in symptoms, or to seek the care of the closest emergency department if he worsens with the above plan.   [name], MHS, PA-C Primary Care at [facility] 04/05/2017 6:34 PM

## 2017-04-01 NOTE — Patient Instructions (Addendum)
Immunization History  Administered Date(s) Administered  . Influenza, Seasonal, Injecte, Preservative Fre 03/09/2013  . Influenza,inj,Quad PF,6+ Mos 05/31/2016, 02/25/2017  . Pneumococcal Conjugate-13 10/02/2014  . Pneumococcal Polysaccharide-23 05/31/2016  . Tdap 03/09/2013  . Zoster 06/04/2014

## 2017-04-10 ENCOUNTER — Other Ambulatory Visit: Payer: Self-pay

## 2017-04-10 ENCOUNTER — Telehealth: Payer: Self-pay | Admitting: *Deleted

## 2017-04-10 MED ORDER — SEMAGLUTIDE (1 MG/DOSE) 2 MG/1.5ML ~~LOC~~ SOPN: 1.0000 mg | PEN_INJECTOR | SUBCUTANEOUS | 3 refills | Status: DC

## 2017-04-10 NOTE — Telephone Encounter (Signed)
Patient called and states that he gets his medication from the [facility] and they don't cover the RX Trulicity they will cover Ozempic . Patient needs a refill of that medication sent to the [facility]. If it can be switched it needs to be sent to [facility] ( [phone])   If you have any questions please call the patient. His contact number is [phone]. Please advise. Thank you

## 2017-04-10 NOTE — Telephone Encounter (Signed)
I called patient back to clarify. He stated that since the [facility] pharmacy didn't have trulicity he would like to switch to ozempic? If that's ok then he would like the prescription printed & sent to his doctor at the [facility] stated in note below. He also wanted me to print off & fax all prescriptions prescribed by you to fax to [facility] so he can have all his prescriptions filled by them if that's ok?

## 2017-04-10 NOTE — Telephone Encounter (Signed)
Please include patient last 4 of S.S when sending RX to the [facility]. That is how they identify the patients.

## 2017-04-10 NOTE — Telephone Encounter (Signed)
I printed  

## 2017-04-11 ENCOUNTER — Other Ambulatory Visit: Payer: Self-pay

## 2017-04-11 MED ORDER — "INSULIN SYRINGE-NEEDLE U-100 31G X 5/16"" 1 ML MISC": 2 refills | Status: DC

## 2017-04-11 MED ORDER — GLUCOSE BLOOD VI STRP
1.0000 | ORAL_STRIP | Freq: Two times a day (BID) | 12 refills | Status: DC
Start: 2017-04-11 — End: 2018-08-27

## 2017-04-11 MED ORDER — INSULIN NPH (HUMAN) (ISOPHANE) 100 UNIT/ML ~~LOC~~ SUSP: 110.0000 [IU] | Freq: Every morning | SUBCUTANEOUS | 11 refills | Status: DC

## 2017-04-11 MED ORDER — METFORMIN HCL 1000 MG PO TABS: 1000.0000 mg | ORAL_TABLET | Freq: Two times a day (BID) | ORAL | 1 refills | Status: DC

## 2017-04-11 NOTE — Telephone Encounter (Signed)
Called and left patient VM that prescriptions were printed & [facility]

## 2017-04-19 ENCOUNTER — Ambulatory Visit (INDEPENDENT_AMBULATORY_CARE_PROVIDER_SITE_OTHER): Payer: BC Managed Care – PPO | Admitting: Endocrinology

## 2017-04-19 ENCOUNTER — Encounter: Payer: Self-pay | Admitting: Endocrinology

## 2017-04-19 VITALS — BP 118/72 | HR 82 | Wt 187.4 lb

## 2017-04-19 DIAGNOSIS — N183 Chronic kidney disease, stage 3 unspecified: Secondary | ICD-10-CM

## 2017-04-19 DIAGNOSIS — E1122 Type 2 diabetes mellitus with diabetic chronic kidney disease: Secondary | ICD-10-CM

## 2017-04-19 DIAGNOSIS — Z794 Long term (current) use of insulin: Secondary | ICD-10-CM | POA: Diagnosis not present

## 2017-04-19 LAB — POCT GLYCOSYLATED HEMOGLOBIN (HGB A1C): Hemoglobin A1C: 7.6

## 2017-04-19 NOTE — Patient Instructions (Addendum)
Please continue the same medications for diabetes. check your blood sugar twice a day.  vary the time of day when you check, between before the 3 meals, and at bedtime.  also check if you have symptoms of your blood sugar being too high or too low.  please keep a record of the readings and bring it to your next appointment here.  You can write it on any piece of paper.  please call us sooner if your blood sugar goes below 70, or if you have a lot of readings over 200.  Please come back for a follow-up appointment in 4 months.

## 2017-04-19 NOTE — Progress Notes (Signed)
Subjective:    Patient ID: Jeff [name], male    DOB: September 04, 1950, [age] y.o.   MRN: [id number]  HPI Pt returns for f/u of diabetes mellitus:  DM type: Insulin-requiring type 2.  Dx'ed: 2005 Complications: painful polyneuropathy and renal insufficiency.  Therapy: insulin since 2016, and trulicity.  DKA: never.   Severe hypoglycemia: never.   Pancreatitis: never.  Other: he used V-GO-40 pump 2016-2018--he stopped due to high dosage requirement; he declines multiple daily injections; he takes NPH qam, due to pattern of cbg's.  Interval history: no cbg record, but states cbg's are well-controlled.  He seldom has hypoglycemia, and these episodes are mild.  pt states he feels well in general. Past Medical History:  Diagnosis Date  . ADHD (attention deficit hyperactivity disorder)   . Diabetes mellitus   . Hyperlipidemia   . Hypertension     Past Surgical History:  Procedure Laterality Date  . NECK SURGERY    . SPINE SURGERY     Cervical spine x 2; [employer]; [employer].    Social History   Socioeconomic History  . Marital status: Single    Spouse name: Not on file  . Number of children: Not on file  . Years of education: Not on file  . Highest education level: Not on file  Social Needs  . Financial resource strain: Not on file  . Food insecurity - worry: Not on file  . Food insecurity - inability: Not on file  . Transportation needs - medical: Not on file  . Transportation needs - non-medical: Not on file  Occupational History  . Occupation: Archivistinancial Examiner Investigator  Tobacco Use  . Smoking status: Never Smoker  . Smokeless tobacco: Never Used  Substance and Sexual Activity  . Alcohol use: No    Alcohol/week: 0.0 oz  . Drug use: No  . Sexual activity: Yes  Other Topics Concern  . Not on file  Social History Narrative  . Not on file    Current Outpatient Medications on File Prior to Visit  Medication Sig Dispense Refill  . aspirin EC 81 MG tablet Take 1 tablet  (81 mg total) by mouth daily. 365 tablet 0  . glucose blood (ONETOUCH VERIO) test strip 1 each 2 (two) times daily by Other route. And lancets 2/day 100 each 12  . insulin NPH Human (NOVOLIN N) 100 UNIT/ML injection Inject 1.1 mLs (110 Units total) every morning into the skin. And syringes 2/day 110 mL 11  . Insulin Pen Needle (BD PEN NEEDLE NANO U/F) 32G X 4 MM MISC Use as directed. 200 each 6  . Insulin Syringe-Needle U-100 (B-D INS SYR ULTRAFINE 1CC/31G) 31G X 5/16" 1 ML MISC Used to inject insulin twice daily. 90 each 2  . lisdexamfetamine (VYVANSE) 70 MG capsule Take 1 capsule (70 mg total) by mouth daily. Fill 60 days after signed 30 capsule 0  . lisinopril (PRINIVIL,ZESTRIL) 10 MG tablet TAKE 1 TABLET(10 MG) BY MOUTH DAILY 90 tablet 0  . metFORMIN (GLUCOPHAGE) 1000 MG tablet Take 1 tablet (1,000 mg total) 2 (two) times daily with a meal by mouth. 180 tablet 1  . prednisoLONE acetate (PRED FORTE) 1 % ophthalmic suspension     . rosuvastatin (CRESTOR) 10 MG tablet Take 1 tablet (10 mg total) by mouth daily. 90 tablet 3  . Semaglutide (OZEMPIC) 1 MG/DOSE SOPN Inject 1 mg once a week into the skin. 12 pen 3   No current facility-administered medications on file prior to visit.  Allergies  Allergen Reactions  . Flexeril [Cyclobenzaprine Hcl] Other (See Comments)    Causes him to pass out and lowers his BP  . Lidocaine Swelling    throat  . Novocain [Procaine Hcl] Swelling    Family History  Problem Relation Age of Onset  . Heart disease Mother        CHF  . Diabetes Mother   . Heart disease Father   . Cancer Brother     BP 118/72 (BP Location: Left Arm, Patient Position: Sitting, Cuff Size: Normal)   Pulse 82   Wt 187 lb 6.4 oz (85 kg)   SpO2 96%   BMI 28.49 kg/m   Review of Systems Denies LOC    Objective:   Physical Exam VITAL SIGNS:  See vs page GENERAL: no distress Pulses: dorsalis pedis intact bilat.   MSK: no deformity of the feet CV: no leg edema.  Skin:   no ulcer on the feet.  normal color and temp on the feet. Neuro: sensation is intact to touch on the feet.  Ext: There is bilateral onychomycosis of the toenails.   Lab Results  Component Value Date   HGBA1C 7.6 04/19/2017      Assessment & Plan:  Insulin-requiring type 2 DM, with renal insuff: this is the best control this pt should aim for, given this regimen, which does match insulin to his changing needs throughout the day.     Patient Instructions  Please continue the same medications for diabetes. check your blood sugar twice a day.  vary the time of day when you check, between before the 3 meals, and at bedtime.  also check if you have symptoms of your blood sugar being too high or too low.  please keep a record of the readings and bring it to your next appointment here.  You can write it on any piece of paper.  please call us sooner if your blood sugar goes below 70, or if you have a lot of readings over 200.  Please come back for a follow-up appointment in 4 months.

## 2017-05-05 ENCOUNTER — Other Ambulatory Visit: Payer: Self-pay | Admitting: Endocrinology

## 2017-05-19 ENCOUNTER — Other Ambulatory Visit: Payer: Self-pay | Admitting: Physician Assistant

## 2017-07-29 ENCOUNTER — Other Ambulatory Visit: Payer: Self-pay

## 2017-07-29 ENCOUNTER — Encounter: Payer: Self-pay | Admitting: Physician Assistant

## 2017-07-29 ENCOUNTER — Ambulatory Visit: Payer: BC Managed Care – PPO | Admitting: Physician Assistant

## 2017-07-29 VITALS — BP 130/84 | HR 86 | Resp 16 | Ht 68.0 in | Wt 187.0 lb

## 2017-07-29 DIAGNOSIS — R1030 Lower abdominal pain, unspecified: Secondary | ICD-10-CM | POA: Diagnosis not present

## 2017-07-29 MED ORDER — FAMOTIDINE 20 MG PO TABS: 20.0000 mg | ORAL_TABLET | Freq: Every day | ORAL | 0 refills | Status: DC

## 2017-07-29 MED ORDER — PANTOPRAZOLE SODIUM 40 MG PO TBEC: 40.0000 mg | DELAYED_RELEASE_TABLET | Freq: Every day | ORAL | 3 refills | Status: DC

## 2017-07-29 NOTE — Patient Instructions (Signed)
     IF you received an x-ray today, you will receive an invoice from [facility]. Please contact [facility] at [phone] with questions or concerns regarding your invoice.   IF you received labwork today, you will receive an invoice from [facility]. Please contact [facility] at [phone] with questions or concerns regarding your invoice.   Our billing staff will not be able to assist you with questions regarding bills from these companies.  You will be contacted with the lab results as soon as they are available. The fastest way to get your results is to activate your My Chart account. Instructions are located on the last page of this paperwork. If you have not heard from us regarding the results in 2 weeks, please contact this office.     

## 2017-07-29 NOTE — Progress Notes (Signed)
07/31/2017 8:23 AM   DOB: 1951-02-24 / MRN: [id number]  SUBJECTIVE:  Jeff [name] is a 67 y.o. male presenting for abdominal pain.  This problem started about a month ago.  The pain is often in the lower abdomen however can be in the upper abdomen as well.  The pain only happens with eating and he states that it takes about 30 minutes for the pain is setting in.  He associates nausea and has had a few episodes of emesis.  He has tried Tums and this sometimes helps and sometimes does not.  He describes it as cramping.  He is moving his bowels normally and denies any blood in the stools.  He is yet to have a colonoscopy despite my advice.  He would like me to reorder the colonoscopy today.  He is allergic to flexeril [cyclobenzaprine hcl]; lidocaine; and novocain [procaine hcl].   He  has a past medical history of ADHD (attention deficit hyperactivity disorder), Diabetes mellitus, Hyperlipidemia, and Hypertension.    He  reports that  has never smoked. he has never used smokeless tobacco. He reports that he does not drink alcohol or use drugs. He  reports that he currently engages in sexual activity. The patient  has a past surgical history that includes Neck surgery and Spine surgery.  His family history includes Cancer in his brother; Diabetes in his mother; Heart disease in his father and mother.  Review of Systems  Constitutional: Negative for chills, diaphoresis and fever.  Eyes: Negative.   Respiratory: Negative for cough, hemoptysis, sputum production, shortness of breath and wheezing.   Cardiovascular: Negative for chest pain, orthopnea and leg swelling.  Gastrointestinal: Positive for abdominal pain, heartburn, nausea and vomiting. Negative for blood in stool, constipation, diarrhea and melena.  Genitourinary: Negative for flank pain.  Skin: Negative for rash.  Neurological: Negative for dizziness, sensory change, speech change, focal weakness and headaches.    The problem list and  medications were reviewed and updated by myself where necessary and exist elsewhere in the encounter.   OBJECTIVE:  BP 130/84 (BP Location: Left Arm, Patient Position: Sitting, Cuff Size: Normal)   Pulse 86   Resp 16   Ht _0  (1.727 m)   Wt 187 lb (84.8 kg)   SpO2 97%   BMI 28.43 kg/m   Physical Exam  Constitutional: He appears well-developed. He is active and cooperative.  Non-toxic appearance.  Cardiovascular: Normal rate, regular rhythm, S1 normal, S2 normal, normal heart sounds, intact distal pulses and normal pulses. Exam reveals no gallop and no friction rub.  No murmur heard. Pulmonary/Chest: Effort normal. No stridor. No tachypnea. No respiratory distress. He has no wheezes. He has no rales.  Abdominal: He exhibits no distension.  Musculoskeletal: He exhibits no edema.  Neurological: He is alert.  Skin: Skin is warm and dry. He is not diaphoretic. No pallor.  Vitals reviewed.   Results for orders placed or performed in visit on 07/29/17 (from the past 72 hour(s))  H. pylori breath test     Status: None   Collection Time: 07/29/17 11:12 AM  Result Value Ref Range   H pylori Breath Test Negative Negative  CBC with Differential     Status: None   Collection Time: 07/29/17 11:15 AM  Result Value Ref Range   WBC 6.3 3.4 - 10.8 x10E3/uL   RBC 5.01 4.14 - 5.80 x10E6/uL   Hemoglobin 14.6 13.0 - 17.7 g/dL   Hematocrit 41.8 37.5 - 51.0 %  MCV 83 79 - 97 fL   MCH 29.1 26.6 - 33.0 pg   MCHC 34.9 31.5 - 35.7 g/dL   RDW 13.4 12.3 - 15.4 %   Platelets 252 150 - 379 x10E3/uL   Neutrophils 55 Not Estab. %   Lymphs 25 Not Estab. %   Monocytes 13 Not Estab. %   Eos 5 Not Estab. %   Basos 1 Not Estab. %   Neutrophils Absolute 3.5 1.4 - 7.0 x10E3/uL   Lymphocytes Absolute 1.6 0.7 - 3.1 x10E3/uL   Monocytes Absolute 0.8 0.1 - 0.9 x10E3/uL   EOS (ABSOLUTE) 0.3 0.0 - 0.4 x10E3/uL   Basophils Absolute 0.0 0.0 - 0.2 x10E3/uL   Immature Granulocytes 1 Not Estab. %   Immature  Grans (Abs) 0.0 0.0 - 0.1 x10E3/uL  Lipase     Status: Abnormal   Collection Time: 07/29/17 11:15 AM  Result Value Ref Range   Lipase 157 (H) 13 - 78 U/L  CMP14+EGFR     Status: Abnormal   Collection Time: 07/29/17 11:15 AM  Result Value Ref Range   Glucose 129 (H) 65 - 99 mg/dL   BUN 18 8 - 27 mg/dL   Creatinine, Ser 1.34 (H) 0.76 - 1.27 mg/dL   GFR calc non Af Amer 54 (L) >59 mL/min/1.73   GFR calc Af Amer 62 >59 mL/min/1.73   BUN/Creatinine Ratio 13 10 - 24   Sodium 137 134 - 144 mmol/L   Potassium 4.6 3.5 - 5.2 mmol/L   Chloride 101 96 - 106 mmol/L   CO2 21 20 - 29 mmol/L   Calcium 9.9 8.6 - 10.2 mg/dL   Total Protein 7.1 6.0 - 8.5 g/dL   Albumin 4.4 3.6 - 4.8 g/dL   Globulin, Total 2.7 1.5 - 4.5 g/dL   Albumin/Globulin Ratio 1.6 1.2 - 2.2   Bilirubin Total 0.4 0.0 - 1.2 mg/dL   Alkaline Phosphatase 64 39 - 117 IU/L   AST 14 0 - 40 IU/L   ALT 20 0 - 44 IU/L    No results found.  ASSESSMENT AND PLAN:  Jeff [name] was seen today for gastroesophageal reflux.  Diagnoses and all orders for this visit:  Lower abdominal pain: We will run basic labs and screen for H. pylori.  I will treat him for GERD as Tums seems to help his symptoms.  He is never had any abdominal surgery and thus I would not be surprised if this may be gallbladder.  He has a long history of diabetes and this could also be diabetic gastroparesis.  He is requesting a colonoscopy be ordered for him today.  I am referring. -     H. pylori breath test -     EKG 12-Lead -     CBC with Differential -     Lipase -     CMP14+EGFR -     Ambulatory referral to Gastroenterology -     famotidine (PEPCID) 20 MG tablet; Take 1 tablet (20 mg total) by mouth daily. Take for 7 days before dinner. -     pantoprazole (PROTONIX) 40 MG tablet; Take 1 tablet (40 mg total) by mouth daily.  Other orders -     Discontinue: pantoprazole (PROTONIX) 40 MG tablet; Take 1 tablet (40 mg total) by mouth daily. -     Discontinue: famotidine  (PEPCID) 20 MG tablet; Take 1 tablet (20 mg total) by mouth daily. Take for 7 days before dinner.    The patient is advised to  call or return to clinic if he does not see an improvement in symptoms, or to seek the care of the closest emergency department if he worsens with the above plan.   Philis Fendt, MHS, PA-C Primary Care at [facility] 07/31/2017 8:23 AM   Addendum:  Patient with elevated lipase however no tenderness in the abdomen.  This may represent medication induced chronic pancreatitis.  I will recheck the lipase in a week and if it remains elevated I will CT his abdomen.  Patient instructed by Felipa Evener CMA to come back or call if at any point his symptoms are worsening.

## 2017-07-30 LAB — CBC WITH DIFFERENTIAL/PLATELET
BASOS ABS: 0 10*3/uL (ref 0.0–0.2)
Basos: 1 %
EOS (ABSOLUTE): 0.3 10*3/uL (ref 0.0–0.4)
Eos: 5 %
HEMOGLOBIN: 14.6 g/dL (ref 13.0–17.7)
Hematocrit: 41.8 % (ref 37.5–51.0)
IMMATURE GRANS (ABS): 0 10*3/uL (ref 0.0–0.1)
IMMATURE GRANULOCYTES: 1 %
LYMPHS: 25 %
Lymphocytes Absolute: 1.6 10*3/uL (ref 0.7–3.1)
MCH: 29.1 pg (ref 26.6–33.0)
MCHC: 34.9 g/dL (ref 31.5–35.7)
MCV: 83 fL (ref 79–97)
MONOCYTES: 13 %
Monocytes Absolute: 0.8 10*3/uL (ref 0.1–0.9)
NEUTROS ABS: 3.5 10*3/uL (ref 1.4–7.0)
NEUTROS PCT: 55 %
Platelets: 252 10*3/uL (ref 150–379)
RBC: 5.01 x10E6/uL (ref 4.14–5.80)
RDW: 13.4 % (ref 12.3–15.4)
WBC: 6.3 10*3/uL (ref 3.4–10.8)

## 2017-07-30 LAB — CMP14+EGFR
A/G RATIO: 1.6 (ref 1.2–2.2)
ALBUMIN: 4.4 g/dL (ref 3.6–4.8)
ALK PHOS: 64 IU/L (ref 39–117)
ALT: 20 IU/L (ref 0–44)
AST: 14 IU/L (ref 0–40)
BILIRUBIN TOTAL: 0.4 mg/dL (ref 0.0–1.2)
BUN / CREAT RATIO: 13 (ref 10–24)
BUN: 18 mg/dL (ref 8–27)
CHLORIDE: 101 mmol/L (ref 96–106)
CO2: 21 mmol/L (ref 20–29)
Calcium: 9.9 mg/dL (ref 8.6–10.2)
Creatinine, Ser: 1.34 mg/dL — ABNORMAL HIGH (ref 0.76–1.27)
GFR calc non Af Amer: 54 mL/min/{1.73_m2} — ABNORMAL LOW (ref >59)
GFR, EST AFRICAN AMERICAN: 62 mL/min/{1.73_m2} (ref >59)
GLOBULIN, TOTAL: 2.7 g/dL (ref 1.5–4.5)
Glucose: 129 mg/dL — ABNORMAL HIGH (ref 65–99)
Potassium: 4.6 mmol/L (ref 3.5–5.2)
SODIUM: 137 mmol/L (ref 134–144)
Total Protein: 7.1 g/dL (ref 6.0–8.5)

## 2017-07-30 LAB — LIPASE: Lipase: 157 U/L — ABNORMAL HIGH (ref 13–78)

## 2017-07-30 LAB — H. PYLORI BREATH TEST: H pylori Breath Test: NEGATIVE

## 2017-08-01 NOTE — Progress Notes (Signed)
Spoke with patient and made him aware of labs, patient has appointment next week and will have labs drawn at that time.

## 2017-08-07 ENCOUNTER — Other Ambulatory Visit: Payer: Self-pay | Admitting: Physician Assistant

## 2017-08-07 NOTE — Telephone Encounter (Signed)
LOV 07/29/17 with [name] / Next appointment is 08/19/17 with [name] / Refill request for Pepcid famotidine (PEPCID) 20 MG tablet 7 tablet 0 07/29/2017    Sig - Route: Take 1 tablet (20 mg total) by mouth daily. Take for 7 days before dinner. - Oral   Class: Print    Medication was only written for 7 tablets.  Do you want patient to continue

## 2017-08-10 ENCOUNTER — Other Ambulatory Visit: Payer: Self-pay | Admitting: Physician Assistant

## 2017-08-10 DIAGNOSIS — R1030 Lower abdominal pain, unspecified: Secondary | ICD-10-CM

## 2017-08-11 ENCOUNTER — Other Ambulatory Visit: Payer: Self-pay | Admitting: Physician Assistant

## 2017-08-12 NOTE — Telephone Encounter (Signed)
LOV 07/29/17 Refill Pepcid - Is pt. To continue medication?

## 2017-08-14 ENCOUNTER — Other Ambulatory Visit: Payer: Self-pay | Admitting: Physician Assistant

## 2017-08-14 DIAGNOSIS — R1030 Lower abdominal pain, unspecified: Secondary | ICD-10-CM

## 2017-08-15 NOTE — Telephone Encounter (Signed)
famotidine refill Last: OV:07/29/17 Las:t Refill3/9/19 PCP: [name] [facility] [address] [place]

## 2017-08-16 ENCOUNTER — Other Ambulatory Visit: Payer: Self-pay

## 2017-08-16 ENCOUNTER — Ambulatory Visit: Payer: BC Managed Care – PPO | Admitting: Endocrinology

## 2017-08-16 ENCOUNTER — Encounter: Payer: Self-pay | Admitting: Endocrinology

## 2017-08-16 ENCOUNTER — Telehealth: Payer: Self-pay

## 2017-08-16 VITALS — BP 102/58 | HR 87 | Wt 187.0 lb

## 2017-08-16 DIAGNOSIS — N183 Chronic kidney disease, stage 3 (moderate): Secondary | ICD-10-CM | POA: Diagnosis not present

## 2017-08-16 DIAGNOSIS — E1122 Type 2 diabetes mellitus with diabetic chronic kidney disease: Secondary | ICD-10-CM

## 2017-08-16 DIAGNOSIS — Z794 Long term (current) use of insulin: Secondary | ICD-10-CM | POA: Diagnosis not present

## 2017-08-16 LAB — HEMOGLOBIN A1C: Hgb A1c MFr Bld: 7.5 % — ABNORMAL HIGH (ref 4.6–6.5)

## 2017-08-16 MED ORDER — METFORMIN HCL 1000 MG PO TABS: 1000.0000 mg | ORAL_TABLET | Freq: Two times a day (BID) | ORAL | 1 refills | Status: DC

## 2017-08-16 MED ORDER — "INSULIN SYRINGE-NEEDLE U-100 31G X 5/16"" 1 ML MISC": 2 refills | Status: DC

## 2017-08-16 NOTE — Progress Notes (Signed)
Subjective:    Patient ID: Jeff [name], male    DOB: [birth year], 67 y.o.   MRN: [id number]  HPI Pt returns for f/u of diabetes mellitus:  DM type: Insulin-requiring type 2.  Dx'ed: 2005 Complications: painful polyneuropathy and renal insufficiency.  Therapy: insulin since 2016, metformin, and Ozempic.  DKA: never.   Severe hypoglycemia: never.   Pancreatitis: never.  Other: he used V-GO-40 pump 2016-2018--he stopped due to high dosage requirement; he declines multiple daily injections; he takes NPH qam, due to pattern of cbg's.  Interval history: no cbg record, but states cbg's vary from 76-200.  It is in general higher as the day goes on.  pt states he feels well in general. Past Medical History:  Diagnosis Date  . ADHD (attention deficit hyperactivity disorder)   . Diabetes mellitus   . Hyperlipidemia   . Hypertension     Past Surgical History:  Procedure Laterality Date  . NECK SURGERY    . SPINE SURGERY     Cervical spine x 2; [employer]; [employer].    Social History   Socioeconomic History  . Marital status: Single    Spouse name: Not on file  . Number of children: Not on file  . Years of education: Not on file  . Highest education level: Not on file  Social Needs  . Financial resource strain: Not on file  . Food insecurity - worry: Not on file  . Food insecurity - inability: Not on file  . Transportation needs - medical: Not on file  . Transportation needs - non-medical: Not on file  Occupational History  . Occupation: Archivist  Tobacco Use  . Smoking status: Never Smoker  . Smokeless tobacco: Never Used  Substance and Sexual Activity  . Alcohol use: No    Alcohol/week: 0.0 oz  . Drug use: No  . Sexual activity: Yes  Other Topics Concern  . Not on file  Social History Narrative  . Not on file    Current Outpatient Medications on File Prior to Visit  Medication Sig Dispense Refill  . aspirin EC 81 MG tablet Take 1 tablet (81 mg  total) by mouth daily. 365 tablet 0  . famotidine (PEPCID) 20 MG tablet TAKE 1 TABLET BY MOUTH DAILY BEFORE DINNER 7 tablet 0  . famotidine (PEPCID) 20 MG tablet TAKE 1 TABLET BY MOUTH DAILY BEFORE DINNER 7 tablet 0  . glucose blood (ONETOUCH VERIO) test strip 1 each 2 (two) times daily by Other route. And lancets 2/day 100 each 12  . insulin NPH Human (NOVOLIN N) 100 UNIT/ML injection Inject 1.1 mLs (110 Units total) every morning into the skin. And syringes 2/day 110 mL 11  . Insulin Pen Needle (BD PEN NEEDLE NANO U/F) 32G X 4 MM MISC Use as directed. 200 each 6  . lisdexamfetamine (VYVANSE) 70 MG capsule Take 1 capsule (70 mg total) by mouth daily. Fill 60 days after signed 30 capsule 0  . lisinopril (PRINIVIL,ZESTRIL) 10 MG tablet TAKE 1 TABLET(10 MG) BY MOUTH DAILY 90 tablet 0  . lisinopril (PRINIVIL,ZESTRIL) 10 MG tablet TAKE 1 TABLET BY MOUTH EVERY DAY 90 tablet 0  . pantoprazole (PROTONIX) 40 MG tablet Take 1 tablet (40 mg total) by mouth daily. 30 tablet 3  . prednisoLONE acetate (PRED FORTE) 1 % ophthalmic suspension     . rosuvastatin (CRESTOR) 10 MG tablet Take 1 tablet (10 mg total) by mouth daily. 90 tablet 3  . Semaglutide (OZEMPIC) 1 MG/DOSE  SOPN Inject 1 mg once a week into the skin. 12 pen 3   No current facility-administered medications on file prior to visit.     Allergies  Allergen Reactions  . Flexeril [Cyclobenzaprine Hcl] Other (See Comments)    Causes him to pass out and lowers his BP  . Lidocaine Swelling    throat  . Novocain [Procaine Hcl] Swelling    Family History  Problem Relation Age of Onset  . Heart disease Mother        CHF  . Diabetes Mother   . Heart disease Father   . Cancer Brother     BP (!) 102/58 (BP Location: Left Arm, Patient Position: Sitting, Cuff Size: Normal)   Pulse 87   Wt 187 lb (84.8 kg)   SpO2 97%   BMI 28.43 kg/m    Review of Systems He denies hypoglycemia.     Objective:   Physical Exam VITAL SIGNS:  See vs  page GENERAL: no distress Pulses: foot pulses are intact bilaterally.   MSK: no deformity of the feet or ankles.  CV: no edema of the legs or ankles Skin:  no ulcer on the feet or ankles.  normal color and temp on the feet and ankles Neuro: sensation is intact to touch on the feet and ankles.   Ext: There is bilateral onychomycosis of the toenails  Lab Results  Component Value Date   CREATININE 1.34 (H) 07/29/2017   BUN 18 07/29/2017   NA 137 07/29/2017   K 4.6 07/29/2017   CL 101 07/29/2017   CO2 21 07/29/2017       Assessment & Plan:  Insulin-requiring type 2 DM, with renal insuff: this is the best control this pt should aim for, given this regimen, which does match insulin to her changing needs throughout the day   Patient Instructions  Please continue the same medications for diabetes. blood tests are requested for you today.  We'll let you know about the results. check your blood sugar twice a day.  vary the time of day when you check, between before the 3 meals, and at bedtime.  also check if you have symptoms of your blood sugar being too high or too low.  please keep a record of the readings and bring it to your next appointment here.  You can write it on any piece of paper.  please call us sooner if your blood sugar goes below 70, or if you have a lot of readings over 200.  Please come back for a follow-up appointment in 4 months.

## 2017-08-16 NOTE — Telephone Encounter (Signed)
I [facility] as he requested.

## 2017-08-16 NOTE — Patient Instructions (Addendum)
Please continue the same medications for diabetes. blood tests are requested for you today.  We'll let you know about the results. check your blood sugar twice a day.  vary the time of day when you check, between before the 3 meals, and at bedtime.  also check if you have symptoms of your blood sugar being too high or too low.  please keep a record of the readings and bring it to your next appointment here.  You can write it on any piece of paper.  please call us sooner if your blood sugar goes below 70, or if you have a lot of readings over 200.  Please come back for a follow-up appointment in 4 months.

## 2017-08-19 ENCOUNTER — Ambulatory Visit: Payer: BC Managed Care – PPO | Admitting: Physician Assistant

## 2017-08-19 ENCOUNTER — Other Ambulatory Visit: Payer: Self-pay

## 2017-08-19 ENCOUNTER — Encounter: Payer: Self-pay | Admitting: Physician Assistant

## 2017-08-19 VITALS — BP 124/76 | HR 85 | Temp 98.0°F | Resp 16 | Ht 67.0 in | Wt 188.4 lb

## 2017-08-19 DIAGNOSIS — R748 Abnormal levels of other serum enzymes: Secondary | ICD-10-CM

## 2017-08-19 DIAGNOSIS — R21 Rash and other nonspecific skin eruption: Secondary | ICD-10-CM | POA: Diagnosis not present

## 2017-08-19 MED ORDER — PERMETHRIN 5 % EX CREA: 1.0000 "application " | TOPICAL_CREAM | Freq: Once | CUTANEOUS | 1 refills | Status: AC

## 2017-08-19 NOTE — Patient Instructions (Addendum)
I'll call with your results and the next steps.     IF you received an x-ray today, you will receive an invoice from [facility]. Please contact [facility] at [phone] with questions or concerns regarding your invoice.   IF you received labwork today, you will receive an invoice from [facility]. Please contact [facility] at [phone] with questions or concerns regarding your invoice.   Our billing staff will not be able to assist you with questions regarding bills from these companies.  You will be contacted with the lab results as soon as they are available. The fastest way to get your results is to activate your My Chart account. Instructions are located on the last page of this paperwork. If you have not heard from us regarding the results in 2 weeks, please contact this office.

## 2017-08-19 NOTE — Progress Notes (Signed)
    08/19/2017 9:00 AM   DOB: 17-Jul-1950 / MRN: [id number]  SUBJECTIVE:  Jeff [name] is a 67 y.o. male presenting for recheck of abdominal pain.  Patient states that he is feeling better with the addition of famotidine.  He is asymptomatic at this point.  He is lipase is noted to be elevated at his last visit.  I have advised that this is nonspecific at this point and we need to recheck the levels and potentially order a CT scan to check for any gross anatomical defects/masses.  Patient is in agreement.  He tells me he is developing itchy rash on his abdomen thinks something is biting him.  Immunization History  Administered Date(s) Administered  . Influenza, Seasonal, Injecte, Preservative Fre 03/09/2013  . Influenza,inj,Quad PF,6+ Mos 05/31/2016, 02/25/2017  . Pneumococcal Conjugate-13 10/02/2014  . Pneumococcal Polysaccharide-23 05/31/2016  . Tdap 03/09/2013  . Zoster 06/04/2014     Review of Systems  Constitutional: Negative for chills, diaphoresis and fever.  Respiratory: Negative for cough, hemoptysis, sputum production, shortness of breath and wheezing.   Cardiovascular: Negative for chest pain, orthopnea and leg swelling.  Gastrointestinal: Negative for abdominal pain, blood in stool, constipation, diarrhea, heartburn, melena, nausea and vomiting.  Genitourinary: Negative for dysuria, flank pain, frequency, hematuria and urgency.  Skin: Positive for itching and rash.  Neurological: Negative for dizziness.    The problem list and medications were reviewed and updated by myself where necessary and exist elsewhere in the encounter.   OBJECTIVE:  BP 124/76   Pulse 85   Temp 98 F (36.7 C) (Oral)   Resp 16   Ht 5\' 7"  (1.702 m)   Wt 188 lb 6.4 oz (85.5 kg)   SpO2 97%   BMI 29.51 kg/m   Physical Exam  Constitutional: He appears well-developed. He is active and cooperative.  Non-toxic appearance.  Cardiovascular: Normal rate, regular rhythm, S1 normal, S2 normal, normal  heart sounds, intact distal pulses and normal pulses. Exam reveals no gallop and no friction rub.  No murmur heard. Pulmonary/Chest: Effort normal. No stridor. No tachypnea. No respiratory distress. He has no wheezes. He has no rales.  Abdominal: He exhibits no distension.  Musculoskeletal: He exhibits no edema.  Neurological: He is alert.  Skin: Skin is warm and dry. He is not diaphoretic. No pallor.  Vitals reviewed.   No results found for this or any previous visit (from the past 72 hour(s)).  No results found.  ASSESSMENT AND PLAN:  Jeff [name] was seen today for follow-up and abdominal pain.  Diagnoses and all orders for this visit:  Elevated lipase: He has no pain on palpation of the abdomen today.  He feels well and is eating well.  I will recheck his lipase.  If it remains abnormal I would likely order a CT scan of the abdomen to rule out any gross abnormalities. -     Lipase -     Renal Function Panel  Rash and nonspecific skin eruption: -     permethrin (ELIMITE) 5 % cream; Apply 1 application topically once for 1 dose.    The patient is advised to call or return to clinic if he does not see an improvement in symptoms, or to seek the care of the closest emergency department if he worsens with the above plan.   Jeff [name], MHS, PA-C Primary Care at [facility] 08/19/2017 8:59 AM

## 2017-08-20 LAB — RENAL FUNCTION PANEL
ALBUMIN: 4.5 g/dL (ref 3.6–4.8)
BUN/Creatinine Ratio: 13 (ref 10–24)
BUN: 17 mg/dL (ref 8–27)
CALCIUM: 9.5 mg/dL (ref 8.6–10.2)
CHLORIDE: 99 mmol/L (ref 96–106)
CO2: 20 mmol/L (ref 20–29)
Creatinine, Ser: 1.28 mg/dL — ABNORMAL HIGH (ref 0.76–1.27)
GFR calc non Af Amer: 57 mL/min/{1.73_m2} — ABNORMAL LOW (ref >59)
GFR, EST AFRICAN AMERICAN: 66 mL/min/{1.73_m2} (ref >59)
Glucose: 226 mg/dL — ABNORMAL HIGH (ref 65–99)
Phosphorus: 3.6 mg/dL (ref 2.5–4.5)
Potassium: 4.8 mmol/L (ref 3.5–5.2)
Sodium: 137 mmol/L (ref 134–144)

## 2017-08-20 LAB — LIPASE: Lipase: 191 U/L — ABNORMAL HIGH (ref 13–78)

## 2017-08-23 ENCOUNTER — Telehealth: Payer: Self-pay | Admitting: Physician Assistant

## 2017-08-23 ENCOUNTER — Other Ambulatory Visit: Payer: Self-pay | Admitting: Physician Assistant

## 2017-08-23 DIAGNOSIS — K859 Acute pancreatitis without necrosis or infection, unspecified: Secondary | ICD-10-CM

## 2017-08-23 NOTE — Telephone Encounter (Signed)
CT of the Abdomen and Pelvis was not approved by [employer]. It looks like they would approve just a CT abdomen for pancreatitis, but they stated "the pelvis exam is not appropriate for evaluation of this condition." Case is to close on 3/26 for review of this prior auth. The provider can call [facility] for peer to peer at [phone] or maybe we could try ordering the CT Abdomen and see if they will just approve that? Let us know how to proceed. Thanks!

## 2017-08-23 NOTE — Telephone Encounter (Signed)
Let please try to send him to an imaging location that will perform just the abdomen.

## 2017-08-24 ENCOUNTER — Other Ambulatory Visit: Payer: Self-pay | Admitting: Physician Assistant

## 2017-08-24 DIAGNOSIS — R1030 Lower abdominal pain, unspecified: Secondary | ICD-10-CM

## 2017-08-26 ENCOUNTER — Telehealth: Payer: Self-pay | Admitting: Physician Assistant

## 2017-08-26 DIAGNOSIS — R748 Abnormal levels of other serum enzymes: Secondary | ICD-10-CM

## 2017-08-26 NOTE — Telephone Encounter (Signed)
Insurance may pay for CT Abdomen with contrast. [facility] will do this type of exam. The CT Abdomen Pelvis was denied, and the order was changed to just an abdomen but it was an Abdomen Limited which [facility] does not do. They will do the CT Abdomen With (CPT 74160 or IMG 237). If you are okay with ordering this please place order and I will work on the prior auth and the facility can do this exam.

## 2017-08-26 NOTE — Telephone Encounter (Signed)
Insurance is not going to pay for him to have a CT abdomen with pelvis.  Please advise.

## 2017-08-26 NOTE — Telephone Encounter (Signed)
Thank you Annie!!

## 2017-08-26 NOTE — Telephone Encounter (Signed)
Pt is scheduled at [facility] to have CT Abdomen w/ contrast on 09/04/17. Spoke with pt and advised per [facility] that he is to arrive at 8:10 for an 8:30 appt at [address] location. He is not to have food 4 hours prior and is to pick up oral contrast from them 48 hrs prior to exam. On the day of exam, he is to drink first bottle at 6:30am and second at 7:30am. He is allowed to have liquids and medications the morning of and is to bring photo ID and insurance card to appt. Pt verbalized understanding.

## 2017-08-26 NOTE — Telephone Encounter (Signed)
Spoke with [facility] and they can do a CT abdomen but it will just need to be a CT Abdomen W/ rather than limited. The CPT code for this exam is 74160 and IMG code is 237. If we can get this ordered, I will work on prior [place]. Thanks!

## 2017-09-03 ENCOUNTER — Telehealth: Payer: Self-pay | Admitting: Family Medicine

## 2017-09-03 NOTE — Telephone Encounter (Signed)
We are not Dr. Rolla EtienneHungs office.

## 2017-09-03 NOTE — Telephone Encounter (Signed)
Copied from CRM [id number]. Topic: Quick Communication - See Telephone Encounter >> Sep 03, 2017  3:35 PM Cipriano BunkerLambe, Annette S wrote: CRM for notification.   Pt. Went to see GI Dr. Elnoria HowardHung appt. 4/1 at 11:00 and was for an hour and did not see the dr.  He is very upset with this office and will not go back to [facility] with Dr. Elnoria HowardHung.   He said No One said anything to him that dr. Was running late or anything and he left.   See Telephone encounter for: 09/03/17.

## 2017-09-04 ENCOUNTER — Ambulatory Visit
Admission: RE | Admit: 2017-09-04 | Discharge: 2017-09-04 | Disposition: A | Discharge status: Home / Self Care | Payer: BC Managed Care – PPO | Source: Ambulatory Visit | Attending: Physician Assistant | Admitting: Physician Assistant

## 2017-09-04 DIAGNOSIS — R748 Abnormal levels of other serum enzymes: Secondary | ICD-10-CM

## 2017-09-04 MED ORDER — IOPAMIDOL (ISOVUE-300) INJECTION 61%
100.0000 mL | Freq: Once | INTRAVENOUS | Status: AC | PRN
  Administered 2017-09-04: 100 mL via INTRAVENOUS

## 2017-09-06 ENCOUNTER — Telehealth: Payer: Self-pay | Admitting: Physician Assistant

## 2017-09-06 NOTE — Telephone Encounter (Signed)
Please advise. CT looked clear.

## 2017-09-06 NOTE — Telephone Encounter (Signed)
Copied from CRM [id number]. Topic: Quick Communication - See Telephone Encounter >> Sep 06, 2017  2:37 PM Cipriano BunkerLambe, Annette S wrote: CRM for notification.   Pt. Calling for CT results.   Says he still has abdominal pain, no appetite and persistent diarrhea.   Please call pt.  See Telephone encounter for: 09/06/17.

## 2017-09-09 ENCOUNTER — Ambulatory Visit: Payer: BC Managed Care – PPO | Admitting: Physician Assistant

## 2017-09-09 VITALS — BP 94/59 | HR 88 | Temp 97.8°F | Resp 16 | Ht 67.0 in | Wt 187.0 lb

## 2017-09-09 DIAGNOSIS — R197 Diarrhea, unspecified: Secondary | ICD-10-CM

## 2017-09-09 LAB — POCT URINALYSIS DIP (MANUAL ENTRY)
BILIRUBIN UA: NEGATIVE mg/dL
Bilirubin, UA: NEGATIVE
Blood, UA: NEGATIVE
Glucose, UA: 100 mg/dL — AB
LEUKOCYTES UA: NEGATIVE
Nitrite, UA: NEGATIVE
PROTEIN UA: NEGATIVE mg/dL
Spec Grav, UA: 1.02 (ref 1.010–1.025)
UROBILINOGEN UA: 0.2 U/dL
pH, UA: 5.5 (ref 5.0–8.0)

## 2017-09-09 LAB — POCT CBC
GRANULOCYTE PERCENT: 65.2 % (ref 37–80)
HCT, POC: 43.2 % — AB (ref 43.5–53.7)
Hemoglobin: 14.4 g/dL (ref 14.1–18.1)
LYMPH, POC: 1.5 (ref 0.6–3.4)
MCH, POC: 28.6 pg (ref 27–31.2)
MCHC: 33.2 g/dL (ref 31.8–35.4)
MCV: 89 fL (ref 80–97)
MID (CBC): 0.5 (ref 0–0.9)
MPV: 7.5 fL (ref 0–99.8)
PLATELET COUNT, POC: 257 10*3/uL (ref 142–424)
POC Granulocyte: 3.8 (ref 2–6.9)
POC LYMPH %: 25.4 % (ref 10–50)
POC MID %: 9.4 %M (ref 0–12)
RBC: 5.03 M/uL (ref 4.69–6.13)
RDW, POC: 13.1 %
WBC: 5.8 10*3/uL (ref 4.6–10.2)

## 2017-09-09 LAB — GLUCOSE, POCT (MANUAL RESULT ENTRY): POC GLUCOSE: 245 mg/dL — AB (ref 70–99)

## 2017-09-09 MED ORDER — LOPERAMIDE HCL 2 MG PO TABS: 2.0000 mg | ORAL_TABLET | Freq: Four times a day (QID) | ORAL | 0 refills | Status: DC | PRN

## 2017-09-09 MED ORDER — PANTOPRAZOLE SODIUM 40 MG PO TBEC: 40.0000 mg | DELAYED_RELEASE_TABLET | Freq: Every day | ORAL | 3 refills | Status: DC

## 2017-09-09 MED ORDER — PANTOPRAZOLE SODIUM 40 MG PO TBEC
40.0000 mg | DELAYED_RELEASE_TABLET | Freq: Every day | ORAL | 3 refills | Status: DC
Start: 2017-09-09 — End: 2017-09-09

## 2017-09-09 NOTE — Patient Instructions (Addendum)
Start the pantoprazole back 30 minustes before the first meal of the day.  Continue the night time heart burn med.  Add in the diarrhea pill as needed for now but I plan to take you off of this in the next 30 days. I've put in a referral for Dr[facility] [place]is [place] [facility].  They're office will call you.      IF you received an x-ray today, you will receive an invoice from [facility]. Please contact [facility] at [phone] with questions or concerns regarding your invoice.   IF you received labwork today, you will receive an invoice from [facility]. Please contact [facility] at [phone] with questions or concerns regarding your invoice.   Our billing staff will not be able to assist you with questions regarding bills from these companies.  You will be contacted with the lab results as soon as they are available. The fastest way to get your results is to activate your My Chart account. Instructions are located on the last page of this paperwork. If you have not heard from us regarding the results in 2 weeks, please contact this office.

## 2017-09-09 NOTE — Progress Notes (Signed)
09/09/2017 9:10 AM   DOB: [birth year] / MRN: [id number]  SUBJECTIVE:  Jeff [name] is a 67 y.o. male presenting for N/V/D over the last week.  Associates fecal urgency and two episodes of incontinence.  Due to a recently elevated lipase x 2 I had recently scanned him for pancreatitis which was negative. He also has GERD and is not taking pantoprazole as I had previously prescribed. Tells me the did start before the contrast CT and patient feels that the contrast did not make the diarrhea worse. Patient is a diabetic who has recently improved his control.  I manage his lisinopril and crestor and he sees endo for diabetes management.  He want to go for a colonoscopy and would like [place] rather than see Elnoria [facility] and [employer].   He is allergic to flexeril [cyclobenzaprine hcl]; lidocaine; and novocain [procaine hcl].   He  has a past medical history of ADHD (attention deficit hyperactivity disorder), Diabetes mellitus, Hyperlipidemia, and Hypertension.    He  reports that he has never smoked. He has never used smokeless tobacco. He reports that he does not drink alcohol or use drugs. He  reports that he currently engages in sexual activity. The patient  has a past surgical history that includes Neck surgery and Spine surgery.  His family history includes Cancer in his brother; Diabetes in his mother; Heart disease in his father and mother.  Review of Systems  Constitutional: Negative for chills, diaphoresis and fever.  Respiratory: Negative for cough, hemoptysis, sputum production, shortness of breath and wheezing.   Cardiovascular: Negative for chest pain, orthopnea and leg swelling.  Gastrointestinal: Positive for diarrhea. Negative for nausea.  Skin: Negative for rash.  Neurological: Negative for dizziness.    The problem list and medications were reviewed and updated by myself where necessary and exist elsewhere in the encounter.   OBJECTIVE:  BP (!) 94/59   Pulse  88   Temp 97.8 F (36.6 C) (Oral)   Resp 16   Ht 5\' 7"  (1.702 m)   Wt 187 lb (84.8 kg)   SpO2 96%   BMI 29.29 kg/m   Wt Readings from Last 3 Encounters:  09/09/17 187 lb (84.8 kg)  08/19/17 188 lb 6.4 oz (85.5 kg)  08/16/17 187 lb (84.8 kg)   Temp Readings from Last 3 Encounters:  09/09/17 97.8 F (36.6 C) (Oral)  08/19/17 98 F (36.7 C) (Oral)  04/01/17 (!) 97.5 F (36.4 C) (Oral)   BP Readings from Last 3 Encounters:  09/09/17 (!) 94/59  08/19/17 124/76  08/16/17 (!) 102/58   Pulse Readings from Last 3 Encounters:  09/09/17 88  08/19/17 85  08/16/17 87     Physical Exam  Constitutional: He appears well-developed. He is active and cooperative.  Non-toxic appearance.  Cardiovascular: Normal rate, regular rhythm, S1 normal, S2 normal, normal heart sounds, intact distal pulses and normal pulses. Exam reveals no gallop and no friction rub.  No murmur heard. Pulmonary/Chest: Effort normal. No stridor. No tachypnea. No respiratory distress. He has no wheezes. He has no rales.  Abdominal: He exhibits no distension.  Musculoskeletal: He exhibits no edema.  Neurological: He is alert.  Skin: Skin is warm and dry. He is not diaphoretic. No pallor.  Vitals reviewed.    Results for orders placed or performed in visit on 09/09/17 (from the past 72 hour(s))  POCT CBC     Status: Abnormal   Collection Time: 09/09/17  8:57 AM  Result Value Ref Range   WBC 5.8 4.6 - 10.2 K/uL   Lymph, poc 1.5 0.6 - 3.4   POC LYMPH PERCENT 25.4 10 - 50 %L   MID (cbc) 0.5 0 - 0.9   POC MID % 9.4 0 - 12 %M   POC Granulocyte 3.8 2 - 6.9   Granulocyte percent 65.2 37 - 80 %G   RBC 5.03 4.69 - 6.13 M/uL   Hemoglobin 14.4 14.1 - 18.1 g/dL   HCT, POC 16.1 (A) 09.6 - 53.7 %   MCV 89.0 80 - 97 fL   MCH, POC 28.6 27 - 31.2 pg   MCHC 33.2 31.8 - 35.4 g/dL   RDW, POC 04.5 %   Platelet Count, POC 257 142 - 424 K/uL   MPV 7.5 0 - 99.8 fL  POCT glucose (manual entry)     Status: Abnormal    Collection Time: 09/09/17  8:58 AM  Result Value Ref Range   POC Glucose 245 (A) 70 - 99 mg/dl    No results found.  ASSESSMENT AND PLAN:  Jeff [name] was seen today for hospitalization follow-up, emesis, diarrhea and medication refill.  Diagnoses and all orders for this visit:  Diarrhea, unspecified type: GERD vs. funtional vs. Infectious. He has not been taking the pantoprazole.  I am starting this back as he is still having post prandial symptoms.  Patient behind on his colonoscopy.  I am sending him to Dr. Myrtie Neither for colonocopy as well as elevated lipase, which is likely medication induced. He may benefit form creon and will defer to Dr. Myrtie Neither on this.  -     POCT CBC -     Renal Function Panel -     Hepatic Function Panel -     POCT urinalysis dipstick -     POCT glucose (manual entry)    The patient is advised to call or return to clinic if he does not see an improvement in symptoms, or to seek the care of the closest emergency department if he worsens with the above plan.   [name], MHS, PA-C Primary Care at [facility] 09/09/2017 9:10 AM

## 2017-09-10 LAB — RENAL FUNCTION PANEL
ALBUMIN: 4.2 g/dL (ref 3.6–4.8)
BUN/Creatinine Ratio: 16 (ref 10–24)
BUN: 22 mg/dL (ref 8–27)
CALCIUM: 9.8 mg/dL (ref 8.6–10.2)
CHLORIDE: 98 mmol/L (ref 96–106)
CO2: 21 mmol/L (ref 20–29)
Creatinine, Ser: 1.38 mg/dL — ABNORMAL HIGH (ref 0.76–1.27)
GFR calc non Af Amer: 52 mL/min/{1.73_m2} — ABNORMAL LOW (ref >59)
GFR, EST AFRICAN AMERICAN: 60 mL/min/{1.73_m2} (ref >59)
GLUCOSE: 257 mg/dL — AB (ref 65–99)
Phosphorus: 3.7 mg/dL (ref 2.5–4.5)
Potassium: 5 mmol/L (ref 3.5–5.2)
Sodium: 133 mmol/L — ABNORMAL LOW (ref 134–144)

## 2017-09-10 LAB — GI PROFILE, STOOL, PCR
ASTROVIRUS: NOT DETECTED
Adenovirus F 40/41: NOT DETECTED
C difficile toxin A/B: NOT DETECTED
CRYPTOSPORIDIUM: NOT DETECTED
CYCLOSPORA CAYETANENSIS: NOT DETECTED
Campylobacter: NOT DETECTED
ENTEROAGGREGATIVE E COLI: NOT DETECTED
ENTEROPATHOGENIC E COLI: NOT DETECTED
ENTEROTOXIGENIC E COLI: NOT DETECTED
Entamoeba histolytica: NOT DETECTED
Giardia lamblia: NOT DETECTED
NOROVIRUS GI/GII: NOT DETECTED
Plesiomonas shigelloides: NOT DETECTED
ROTAVIRUS A: NOT DETECTED
SAPOVIRUS: NOT DETECTED
SHIGA-TOXIN-PRODUCING E COLI: NOT DETECTED
Salmonella: NOT DETECTED
Shigella/Enteroinvasive E coli: NOT DETECTED
VIBRIO: NOT DETECTED
Vibrio cholerae: NOT DETECTED
Yersinia enterocolitica: NOT DETECTED

## 2017-09-10 LAB — HEPATIC FUNCTION PANEL
ALT: 23 IU/L (ref 0–44)
AST: 17 IU/L (ref 0–40)
Alkaline Phosphatase: 68 IU/L (ref 39–117)
BILIRUBIN, DIRECT: 0.09 mg/dL (ref 0.00–0.40)
Bilirubin Total: 0.3 mg/dL (ref 0.0–1.2)
TOTAL PROTEIN: 6.7 g/dL (ref 6.0–8.5)

## 2017-09-11 ENCOUNTER — Encounter: Payer: Self-pay | Admitting: Physician Assistant

## 2017-09-30 ENCOUNTER — Ambulatory Visit: Payer: BC Managed Care – PPO | Admitting: Physician Assistant

## 2017-09-30 ENCOUNTER — Other Ambulatory Visit: Payer: Self-pay

## 2017-09-30 ENCOUNTER — Encounter: Payer: Self-pay | Admitting: Physician Assistant

## 2017-09-30 VITALS — BP 108/66 | HR 72 | Temp 98.7°F | Resp 16 | Ht 68.0 in | Wt 188.0 lb

## 2017-09-30 DIAGNOSIS — R630 Anorexia: Secondary | ICD-10-CM | POA: Diagnosis not present

## 2017-09-30 DIAGNOSIS — R748 Abnormal levels of other serum enzymes: Secondary | ICD-10-CM

## 2017-09-30 DIAGNOSIS — K861 Other chronic pancreatitis: Secondary | ICD-10-CM

## 2017-09-30 LAB — POCT CBC
GRANULOCYTE PERCENT: 64.9 % (ref 37–80)
HEMATOCRIT: 44 % (ref 43.5–53.7)
Hemoglobin: 14.7 g/dL (ref 14.1–18.1)
Lymph, poc: 1.9 (ref 0.6–3.4)
MCH, POC: 28.6 pg (ref 27–31.2)
MCHC: 33.4 g/dL (ref 31.8–35.4)
MCV: 85.8 fL (ref 80–97)
MID (CBC): 0.5 (ref 0–0.9)
MPV: 7.5 fL (ref 0–99.8)
POC Granulocyte: 4.3 (ref 2–6.9)
POC LYMPH PERCENT: 28.2 %L (ref 10–50)
POC MID %: 6.9 % (ref 0–12)
Platelet Count, POC: 249 10*3/uL (ref 142–424)
RBC: 5.12 M/uL (ref 4.69–6.13)
RDW, POC: 13.6 %
WBC: 6.6 10*3/uL (ref 4.6–10.2)

## 2017-09-30 LAB — POCT URINALYSIS DIP (MANUAL ENTRY)
BILIRUBIN UA: NEGATIVE
GLUCOSE UA: NEGATIVE mg/dL
Ketones, POC UA: NEGATIVE mg/dL
Leukocytes, UA: NEGATIVE
Nitrite, UA: NEGATIVE
Protein Ur, POC: NEGATIVE mg/dL
RBC UA: NEGATIVE
SPEC GRAV UA: 1.01 (ref 1.010–1.025)
Urobilinogen, UA: 0.2 E.U./dL
pH, UA: 6.5 (ref 5.0–8.0)

## 2017-09-30 NOTE — Progress Notes (Signed)
09/30/2017 8:40 AM   DOB: 01/03/1951 / MRN: [id number]  SUBJECTIVE:  Jeff [name] is a 67 y.o. male presenting for continued GI symptoms.  Patient tells me that over the last 3 weeks he has lost his appetite and yesterday only ate 2 eggs and 2 banana sandwiches.  Denies abdominal pain and has stopped having diarrhea.  He is taking pantoprazole 40 mg 30 minutes before the first meal of the day along with famotidine 30 minutes before dinner and continues to have some GERD-like symptoms.  I had him scheduled to see Dr. Maurine Minister in GI at the [facility] however this appointment was going to take too long to get to so he decided to cancel that and find a GI specialist at [facility].  His last bowel movement was this morning and nonbloody and normal for him.  States that he is urinating normally.  He denies any rash and itching.  He has recently stopped working and is considering retiring.  This is been a big change for him.  He tells me that he still enjoys seeing his old Acupuncturist and going out for lunch.  He sleeps well but wakes up at 4 AM to watch westerns.  Tells me if he is not out with his wife friends that he is at home and watching Western shows.  He is allergic to flexeril [cyclobenzaprine hcl]; lidocaine; and novocain [procaine hcl].   He  has a past medical history of ADHD (attention deficit hyperactivity disorder), Diabetes mellitus, Hyperlipidemia, and Hypertension.    He  reports that he has never smoked. He has never used smokeless tobacco. He reports that he does not drink alcohol or use drugs. He  reports that he currently engages in sexual activity. The patient  has a past surgical history that includes Neck surgery and Spine surgery.  His family history includes Cancer in his brother; Diabetes in his mother; Heart disease in his father and mother.  Review of Systems  Constitutional: Negative for chills, fever and weight loss.  HENT: Negative for sore throat.   Respiratory: Negative for  cough.   Cardiovascular: Negative for chest pain.  Gastrointestinal: Positive for heartburn. Negative for abdominal pain, blood in stool, constipation, diarrhea, melena, nausea and vomiting.  Musculoskeletal: Negative for myalgias.    The problem list and medications were reviewed and updated by myself where necessary and exist elsewhere in the encounter.   OBJECTIVE:  BP 108/66   Pulse 72   Temp 98.7 F (37.1 C) (Oral)   Resp 16   Ht  (1.727 m)   Wt 188 lb (85.3 kg)   SpO2 98%   BMI 28.59 kg/m   Wt Readings from Last 3 Encounters:  09/30/17 188 lb (85.3 kg)  09/09/17 187 lb (84.8 kg)  08/19/17 188 lb 6.4 oz (85.5 kg)     Physical Exam  Constitutional: He is oriented to person, place, and time. He appears well-developed. He is active.  Non-toxic appearance. He does not appear ill.  Eyes: Pupils are equal, round, and reactive to light. Conjunctivae and EOM are normal.  Cardiovascular: Normal rate, regular rhythm, S1 normal, S2 normal, normal heart sounds, intact distal pulses and normal pulses. Exam reveals no gallop and no friction rub.  No murmur heard. Pulmonary/Chest: Effort normal. No stridor. No respiratory distress. He has no wheezes. He has no rales.  Abdominal: He exhibits no distension.  Musculoskeletal: Normal range of motion. He exhibits no edema.  Neurological: He is alert  and oriented to person, place, and time. No cranial nerve deficit. Coordination normal.  Skin: Skin is warm and dry. He is not diaphoretic. No pallor.  Psychiatric: He has a normal mood and affect.  Nursing note and vitals reviewed.   Lab Results  Component Value Date   CREATININE 1.38 (H) 09/09/2017   BUN 22 09/09/2017   NA 133 (L) 09/09/2017   K 5.0 09/09/2017   CL 98 09/09/2017   CO2 21 09/09/2017   Lab Results  Component Value Date   WBC 5.8 09/09/2017   HGB 14.4 09/09/2017   HCT 43.2 (A) 09/09/2017   MCV 89.0 09/09/2017   PLT 252 07/29/2017     No results found for  this or any previous visit (from the past 72 hour(s)).  No results found.  ASSESSMENT AND PLAN:  Daylan was seen today for heartburn.  Diagnoses and all orders for this visit:  Appetite loss: Patient with elevated lipase and GERD with a normal CT scan of the abdomen with contrast.  His exam is normal today and thus reassuring.  I will continue to keep a close eye on his lab work and have encouraged him to find a GI provider at [facility] and to please have them send me any clinical correspondence. -     Lipase -     POCT CBC -     POCT urinalysis dipstick -     CMP and Liver -     Care order/instruction:    The patient is advised to call or return to clinic if he does not see an improvement in symptoms, or to seek the care of the closest emergency department if he worsens with the above plan.   [name], MHS, PA-C Primary Care at [facility] 09/30/2017 8:40 AM

## 2017-09-30 NOTE — Patient Instructions (Addendum)
  You have a good exam Demonta. Please keep me updated about your GI progress at [facility].  Please call Dr. Dione Booze to get an appointment for your vision complaint.  I'd like to see you back in three month, sooner if you have any abrupt changes in your health.    IF you received an x-ray today, you will receive an invoice from [facility]. Please contact [facility] at [phone] with questions or concerns regarding your invoice.   IF you received labwork today, you will receive an invoice from [facility]. Please contact [facility] at [phone] with questions or concerns regarding your invoice.   Our billing staff will not be able to assist you with questions regarding bills from these companies.  You will be contacted with the lab results as soon as they are available. The fastest way to get your results is to activate your My Chart account. Instructions are located on the last page of this paperwork. If you have not heard from [place] regarding the results in 2 weeks, please contact this office.

## 2017-10-01 ENCOUNTER — Telehealth: Payer: Self-pay | Admitting: Family Medicine

## 2017-10-01 LAB — CMP AND LIVER
ALBUMIN: 4.4 g/dL (ref 3.6–4.8)
ALK PHOS: 67 IU/L (ref 39–117)
ALT: 25 IU/L (ref 0–44)
AST: 15 IU/L (ref 0–40)
BUN: 13 mg/dL (ref 8–27)
Bilirubin Total: 0.3 mg/dL (ref 0.0–1.2)
Bilirubin, Direct: 0.09 mg/dL (ref 0.00–0.40)
CO2: 22 mmol/L (ref 20–29)
CREATININE: 1.23 mg/dL (ref 0.76–1.27)
Calcium: 9.3 mg/dL (ref 8.6–10.2)
Chloride: 99 mmol/L (ref 96–106)
GFR calc Af Amer: 69 mL/min/{1.73_m2} (ref >59)
GFR, EST NON AFRICAN AMERICAN: 60 mL/min/{1.73_m2} (ref >59)
GLUCOSE: 187 mg/dL — AB (ref 65–99)
Potassium: 4.6 mmol/L (ref 3.5–5.2)
Sodium: 137 mmol/L (ref 134–144)
Total Protein: 7.3 g/dL (ref 6.0–8.5)

## 2017-10-01 LAB — LIPASE: LIPASE: 83 U/L — AB (ref 13–78)

## 2017-10-01 NOTE — Telephone Encounter (Signed)
Copied from CRM [id number]. Topic: Quick Communication - See Telephone Encounter >> Oct 01, 2017  2:36 PM Waymon Amato wrote: PT has been taking protonix and it does not seem to be helping he is having a lot of stomach pain and states that the only tie he gets relief is when he makes himself vomit and he really does not want to do that  Pt also feels like he needs to have a bowel moement and nothing happens -he has not had one since early Monday morning    Best number [phone]

## 2017-10-01 NOTE — Telephone Encounter (Signed)
Phone call to patient to discuss symptoms.   Patient states, "Last night for supper I had some clam chowder soup. My wife makes me eat because I'm diabetic and I need to take medication. I started getting heartburn around 9pm last night. The longer it went, the worse it got. I took one pill (Protonix) at 5:45, ate supper at 7pm. Took the other one after I ate. I laid down to go to bed at 10pm. My abdomen was hurting so bad I couldn't sleep. Finally fell asleep at 4am."  Took 6 equate anti-acid tablets.  Patient states he ".Marland KitchenMarland KitchenAte two boiled eggs for breakfast. Again this morning I took another one of those (Protonix, took at 1pm).  Ate two banana sandwiches at 1:30. I thought I was gonna die I was hurting so bad. It makes me feel so much better when I throw up. In 10 minutes I'm fine."  Abdomen is painful right now. Location is 4 inches above navel. Pain is only after he eats. Rates pain as a 6-7 currently on a scale of 0-10.   Reports hiccups and excessive belching. Denies passing gas.   Drinking fluids, still urinating, urine is light yellow. Has had 10 oz of water and 8 oz diet coke today.   No vomiting in the last 24 hours. Denies frank blood in emesis, however states emesis Friday night was "real dark, you'd thought I ate a candy bar". Had salad and 1 piece of salisbury steak with peas and mashed potatoes that day.  No BM since yesterday morning, since am. Feels like he has to go.   Due to persisting abdomina pain with relief with vomiting, dark emesis on Friday, and history of diabetes, recommend patient come in as soon as possible for evaluation of symptoms. He is agreeable, transferred to front desk to schedule.

## 2017-10-02 ENCOUNTER — Encounter: Payer: Self-pay | Admitting: Physician Assistant

## 2017-10-02 ENCOUNTER — Other Ambulatory Visit: Payer: Self-pay

## 2017-10-02 ENCOUNTER — Ambulatory Visit: Payer: BC Managed Care – PPO | Admitting: Physician Assistant

## 2017-10-02 VITALS — BP 98/62 | HR 114 | Temp 98.6°F | Resp 18 | Ht 68.0 in | Wt 189.6 lb

## 2017-10-02 DIAGNOSIS — R630 Anorexia: Secondary | ICD-10-CM | POA: Diagnosis not present

## 2017-10-02 DIAGNOSIS — R000 Tachycardia, unspecified: Secondary | ICD-10-CM | POA: Diagnosis not present

## 2017-10-02 MED ORDER — ONDANSETRON HCL 4 MG PO TABS: 4.0000 mg | ORAL_TABLET | Freq: Three times a day (TID) | ORAL | 0 refills | Status: DC | PRN

## 2017-10-02 MED ORDER — ONDANSETRON HCL 4 MG PO TABS: 4.0000 mg | ORAL_TABLET | Freq: Three times a day (TID) | ORAL | 1 refills | Status: DC | PRN

## 2017-10-02 NOTE — Progress Notes (Deleted)
10/02/2017 2:23 PM   DOB: [birth year] / MRN: [id number]  SUBJECTIVE:  Jeff [name] is a 67 y.o. male presenting for   He is allergic to flexeril [cyclobenzaprine hcl]; lidocaine; and novocain [procaine hcl].   He  has a past medical history of ADHD (attention deficit hyperactivity disorder), Diabetes mellitus, Hyperlipidemia, and Hypertension.    He  reports that he has never smoked. He has never used smokeless tobacco. He reports that he does not drink alcohol or use drugs. He  reports that he currently engages in sexual activity. The patient  has a past surgical history that includes Neck surgery and Spine surgery.  His family history includes Cancer in his brother; Diabetes in his mother; Heart disease in his father and mother.  ROS  The problem list and medications were reviewed and updated by myself where necessary and exist elsewhere in the encounter.    OBJECTIVE:  BP 98/62 (BP Location: Left Arm, Patient Position: Sitting, Cuff Size: Normal)   Pulse (!) 114   Temp 98.6 F (37 C) (Oral)   Resp 18   Ht  (1.727 m)   Wt 189 lb 9.6 oz (86 kg)   SpO2 93%   BMI 28.83 kg/m   Physical Exam  Results for orders placed or performed in visit on 09/30/17 (from the past 72 hour(s))  POCT CBC     Status: None   Collection Time: 09/30/17  8:53 AM  Result Value Ref Range   WBC 6.6 4.6 - 10.2 K/uL   Lymph, poc 1.9 0.6 - 3.4   POC LYMPH PERCENT 28.2 10 - 50 %L   MID (cbc) 0.5 0 - 0.9   POC MID % 6.9 0 - 12 %M   POC Granulocyte 4.3 2 - 6.9   Granulocyte percent 64.9 37 - 80 %G   RBC 5.12 4.69 - 6.13 M/uL   Hemoglobin 14.7 14.1 - 18.1 g/dL   HCT, POC 40.9 81.1 - 53.7 %   MCV 85.8 80 - 97 fL   MCH, POC 28.6 27 - 31.2 pg   MCHC 33.4 31.8 - 35.4 g/dL   RDW, POC 91.4 %   Platelet Count, POC 249 142 - 424 K/uL   MPV 7.5 0 - 99.8 fL  POCT urinalysis dipstick     Status: None   Collection Time: 09/30/17  8:55 AM  Result Value Ref Range   Color, UA yellow yellow   Clarity, UA  clear clear   Glucose, UA negative negative mg/dL   Bilirubin, UA negative negative   Ketones, POC UA negative negative mg/dL   Spec Grav, UA 7.829 5.621 - 1.025   Blood, UA negative negative   pH, UA 6.5 5.0 - 8.0   Protein Ur, POC negative negative mg/dL   Urobilinogen, UA 0.2 0.2 or 1.0 E.U./dL   Nitrite, UA Negative Negative   Leukocytes, UA Negative Negative  Lipase     Status: Abnormal   Collection Time: 09/30/17  2:19 PM  Result Value Ref Range   Lipase 83 (H) 13 - 78 U/L  CMP and Liver     Status: Abnormal   Collection Time: 09/30/17  2:19 PM  Result Value Ref Range   Glucose 187 (H) 65 - 99 mg/dL   BUN 13 8 - 27 mg/dL   Creatinine, Ser 3.08 0.76 - 1.27 mg/dL   GFR calc non Af Amer 60 >59 mL/min/1.73   GFR calc Af Amer 69 >59 mL/min/1.73   Sodium  137 134 - 144 mmol/L   Potassium 4.6 3.5 - 5.2 mmol/L   Chloride 99 96 - 106 mmol/L   CO2 22 20 - 29 mmol/L   Calcium 9.3 8.6 - 10.2 mg/dL   Total Protein 7.3 6.0 - 8.5 g/dL   Albumin 4.4 3.6 - 4.8 g/dL   Bilirubin Total 0.3 0.0 - 1.2 mg/dL   Bilirubin, Direct 1.61 0.00 - 0.40 mg/dL   Alkaline Phosphatase 67 39 - 117 IU/L   AST 15 0 - 40 IU/L   ALT 25 0 - 44 IU/L    No results found.  ASSESSMENT AND PLAN:  There are no diagnoses linked to this encounter.  The patient is advised to call or return to clinic if he does not see an improvement in symptoms, or to seek the care of the closest emergency department if he worsens with the above plan.   [name], MHS, PA-C Primary Care at [facility] 10/02/2017 2:23 PM

## 2017-10-02 NOTE — Progress Notes (Signed)
10/04/2017 10:09 AM   DOB: [birth year] / MRN: [id number]  SUBJECTIVE:  Jeff [name] is a 67 y.o. male presenting for recheck of nausea and abdominal pain.  This problem has been ongoing now for roughly 4 or so months.  He feels that he is slowly getting worse.  He tells me he has had nothing to eat in the last 36 hours.  In the scope of this problem I have checked an EKG along with a CT scan of the abdomen all of which were within normal limits.  He had a elevated lipase which has improved with watchful waiting.    He is allergic to flexeril [cyclobenzaprine hcl]; lidocaine; and novocain [procaine hcl].   He  has a past medical history of ADHD (attention deficit hyperactivity disorder), Diabetes mellitus, Hyperlipidemia, and Hypertension.    He  reports that he has never smoked. He has never used smokeless tobacco. He reports that he does not drink alcohol or use drugs. He  reports that he currently engages in sexual activity. The patient  has a past surgical history that includes Neck surgery and Spine surgery.  His family history includes Cancer in his brother; Diabetes in his mother; Heart disease in his father and mother.  Review of Systems  Gastrointestinal: Positive for abdominal pain, nausea and vomiting. Negative for blood in stool, constipation, diarrhea, heartburn and melena.    The problem list and medications were reviewed and updated by myself where necessary and exist elsewhere in the encounter.   OBJECTIVE:  BP 98/62 (BP Location: Left Arm, Patient Position: Sitting, Cuff Size: Normal)   Pulse (!) 114   Temp 98.6 F (37 C) (Oral)   Resp 18   Ht  (1.727 m)   Wt 189 lb 9.6 oz (86 kg)   SpO2 93%   BMI 28.83 kg/m   Physical Exam  Constitutional: He is oriented to person, place, and time. He appears well-developed. He does not appear ill.  Eyes: Pupils are equal, round, and reactive to light. Conjunctivae and EOM are normal.  Cardiovascular: Normal rate, regular  rhythm, S1 normal, S2 normal, normal heart sounds, intact distal pulses and normal pulses. Exam reveals no gallop and no friction rub.  No murmur heard. Pulmonary/Chest: Effort normal. No stridor. No respiratory distress. He has no wheezes. He has no rales.  Abdominal: Bowel sounds are normal. He exhibits no distension, no pulsatile midline mass and no mass. There is no tenderness.  Musculoskeletal: Normal range of motion. He exhibits no edema.  Neurological: He is alert and oriented to person, place, and time. No cranial nerve deficit. Coordination normal.  Skin: Skin is warm and dry. He is not diaphoretic.  Psychiatric: He has a normal mood and affect.  Nursing note and vitals reviewed.   No results found for this or any previous visit (from the past 72 hour(s)).  No results found.  ASSESSMENT AND PLAN:  Jeff [name] was seen today for abdominal pain and follow-up.  Diagnoses and all orders for this visit:  Tachycardia -     Orthostatic vital signs  Poor appetite: I think this is most likely secondary to his Ozempic.  He was placed on this in December or January and these abdominal complaints started about that time.  He is taking a maximum dose.  He had a similar reaction to Trulicity in the past however this was more mild.  I am stopping the Ozempic at this time because he is not eating and he  is starting to feel somewhat dizzy with standing despite normal orthostatic vital signs.  If the Ozempic is the culprit I expect he will be feeling somewhat better in about 1.5 weeks as he just had his injection.  I have sent a message to his endocrinologist to keep him in the loop.  If his GLP-1 is stopped permanently he will need insulin titration. -     ondansetron (ZOFRAN) 4 MG tablet; Take 1 tablet (4 mg total) by mouth every 8 (eight) hours as needed for nausea or vomiting.  Other orders -     Discontinue: ondansetron (ZOFRAN) 4 MG tablet; Take 1 tablet (4 mg total) by mouth every 8 (eight) hours  as needed for nausea or vomiting.    The patient is advised to call or return to clinic if he does not see an improvement in symptoms, or to seek the care of the closest emergency department if he worsens with the above plan.   [name], MHS, PA-C Primary Care at [facility] 10/04/2017 10:09 AM

## 2017-10-02 NOTE — Patient Instructions (Addendum)
Stop taking your Ozempic.  I am reaching out to Dr. Everardo All to inform him of these changes.  If this is an factor Ozempic medication causing these symptoms I will expect for you to  start to improve in about 1.5 weeks or so.  I am giving you some Zofran which is a medication for nausea. I want to see you back in about three weeks.  Please make that appointment on your way out today.     IF you received an x-ray today, you will receive an invoice from [facility]. Please contact [facility] at [phone] with questions or concerns regarding your invoice.   IF you received labwork today, you will receive an invoice from [place]. Please contact [facility] at [phone] with questions or concerns regarding your invoice.   Our billing staff will not be able to assist you with questions regarding bills from these companies.  You will be contacted with the lab results as soon as they are available. The fastest way to get your results is to activate your My Chart account. Instructions are located on the last page of this paperwork. If you have not heard from [place] regarding the results in 2 weeks, please contact this office.

## 2017-10-23 ENCOUNTER — Other Ambulatory Visit: Payer: Self-pay

## 2017-10-23 ENCOUNTER — Encounter: Payer: Self-pay | Admitting: Physician Assistant

## 2017-10-23 ENCOUNTER — Ambulatory Visit: Payer: BC Managed Care – PPO | Admitting: Physician Assistant

## 2017-10-23 VITALS — BP 98/60 | HR 87 | Temp 98.1°F | Ht 68.0 in | Wt 188.8 lb

## 2017-10-23 DIAGNOSIS — R1084 Generalized abdominal pain: Secondary | ICD-10-CM | POA: Diagnosis not present

## 2017-10-23 NOTE — Patient Instructions (Addendum)
Come back in about 1 month for a med check     IF you received an x-ray today, you will receive an invoice from [facility]. Please contact [facility] at [phone] with questions or concerns regarding your invoice.   IF you received labwork today, you will receive an invoice from [place]. Please contact [facility] at [phone] with questions or concerns regarding your invoice.   Our billing staff will not be able to assist you with questions regarding bills from these companies.  You will be contacted with the lab results as soon as they are available. The fastest way to get your results is to activate your My Chart account. Instructions are located on the last page of this paperwork. If you have not heard from [place] regarding the results in 2 weeks, please contact this office.

## 2017-10-23 NOTE — Progress Notes (Signed)
10/23/2017 8:33 AM   DOB: 1950/12/07 / MRN: [id number]  SUBJECTIVE:  Jeff [name] is a 67 y.o. male presenting for recheck abdominal pain and nausea.  Symptoms abated roughly 1.5 weeks after stopping ozempic.  Patient also stopped eating tomatoes at that time and saw an immediate reduction in symptoms with this. He has eaten tomatoes his entire life. He has been able to stop his pantoprazole as well.  He started back on his ozempic yesterday and will continue to hold the tomatoes. He has an appointment with Dr. Everardo [name] in endocrinology on June 5th.   He is allergic to flexeril [cyclobenzaprine hcl]; lidocaine; and novocain [procaine hcl].   He  has a past medical history of ADHD (attention deficit hyperactivity disorder), Diabetes mellitus, Hyperlipidemia, and Hypertension.    He  reports that he has never smoked. He has never used smokeless tobacco. He reports that he does not drink alcohol or use drugs. He  reports that he currently engages in sexual activity. The patient  has a past surgical history that includes Neck surgery and Spine surgery.  His family history includes Cancer in his brother; Diabetes in his mother; Heart disease in his father and mother.  Review of Systems  Constitutional: Negative for chills, diaphoresis and fever.  Gastrointestinal: Negative for abdominal pain, blood in stool, constipation, diarrhea, heartburn, melena, nausea and vomiting.  Genitourinary: Negative for dysuria, flank pain, frequency, hematuria and urgency.  Skin: Negative for rash.  Neurological: Negative for dizziness.    The problem list and medications were reviewed and updated by myself where necessary and exist elsewhere in the encounter.   OBJECTIVE:  BP 98/60 (BP Location: Left Arm, Patient Position: Sitting, Cuff Size: Normal)   Pulse 87   Temp 98.1 F (36.7 C) (Oral)   Ht  (1.727 m)   Wt 188 lb 12.8 oz (85.6 kg)   SpO2 96%   BMI 28.71 kg/m   BP Readings from Last 3  Encounters:  10/23/17 98/60  10/02/17 98/62  09/30/17 108/66   Pulse Readings from Last 3 Encounters:  10/23/17 87  10/02/17 (!) 114  09/30/17 72   Wt Readings from Last 3 Encounters:  10/23/17 188 lb 12.8 oz (85.6 kg)  10/02/17 189 lb 9.6 oz (86 kg)  09/30/17 188 lb (85.3 kg)    Physical Exam  Constitutional: He is oriented to person, place, and time. He appears well-developed. He does not appear ill.  Eyes: Pupils are equal, round, and reactive to light. Conjunctivae and EOM are normal.  Cardiovascular: Normal rate, regular rhythm, S1 normal, S2 normal, normal heart sounds, intact distal pulses and normal pulses. Exam reveals no gallop and no friction rub.  No murmur heard. Pulmonary/Chest: Effort normal. No stridor. No respiratory distress. He has no wheezes. He has no rales.  Abdominal: He exhibits no distension.  Musculoskeletal: Normal range of motion. He exhibits no edema.  Neurological: He is alert and oriented to person, place, and time. No cranial nerve deficit. Coordination normal.  Skin: Skin is warm and dry. He is not diaphoretic.  Psychiatric: He has a normal mood and affect.  Nursing note and vitals reviewed.   No results found for this or any previous visit (from the past 72 hour(s)).  No results found.  ASSESSMENT AND PLAN:  Jeff [name] was seen today for follow-up.  Diagnoses and [name] orders for this visit:  Generalized abdominal pain: Food allergy vs. Ozempic.  Legrand Rams the former.  He started his ozempic back  yesterday.  He will continue to hold tomatoes.     The patient is advised to call or return to clinic if he does not see an improvement in symptoms, or to seek the care of the closest emergency department if he worsens with the above plan.   [name], MHS, PA-C Primary Care at [facility] 10/23/2017 8:33 AM

## 2017-10-24 ENCOUNTER — Encounter: Payer: Self-pay | Admitting: Physician Assistant

## 2017-10-25 ENCOUNTER — Encounter: Payer: Self-pay | Admitting: Physician Assistant

## 2017-11-27 ENCOUNTER — Ambulatory Visit: Payer: BC Managed Care – PPO | Admitting: Physician Assistant

## 2017-12-06 ENCOUNTER — Ambulatory Visit: Payer: BC Managed Care – PPO | Admitting: Endocrinology

## 2017-12-19 ENCOUNTER — Telehealth: Payer: Self-pay | Admitting: Physician Assistant

## 2017-12-19 NOTE — Telephone Encounter (Signed)
Copied from CRM [phone] [id number]. Topic: Quick Communication - See Telephone Encounter >> Dec 19, 2017  4:23 PM Herby AbrahamJohnson, Shiquita C wrote: CRM for notification. See Telephone encounter for: 12/19/17.  Pt received letter that his PCP is leaving the office, pt would like to know where, what new practice will PCP be joining.

## 2017-12-20 ENCOUNTER — Encounter: Payer: Self-pay | Admitting: Physician Assistant

## 2017-12-23 NOTE — Telephone Encounter (Signed)
Left message for pt letting him know where his PCP is relocating to.

## 2017-12-30 ENCOUNTER — Other Ambulatory Visit: Payer: Self-pay

## 2017-12-30 ENCOUNTER — Encounter: Payer: Self-pay | Admitting: Physician Assistant

## 2017-12-30 ENCOUNTER — Ambulatory Visit: Payer: BC Managed Care – PPO | Admitting: Physician Assistant

## 2017-12-30 VITALS — BP 117/73 | HR 81 | Temp 98.4°F | Resp 16 | Ht 68.0 in | Wt 186.8 lb

## 2017-12-30 DIAGNOSIS — I10 Essential (primary) hypertension: Secondary | ICD-10-CM

## 2017-12-30 DIAGNOSIS — E1165 Type 2 diabetes mellitus with hyperglycemia: Secondary | ICD-10-CM

## 2017-12-30 DIAGNOSIS — E118 Type 2 diabetes mellitus with unspecified complications: Secondary | ICD-10-CM

## 2017-12-30 DIAGNOSIS — Z794 Long term (current) use of insulin: Secondary | ICD-10-CM

## 2017-12-30 DIAGNOSIS — Z9189 Other specified personal risk factors, not elsewhere classified: Secondary | ICD-10-CM

## 2017-12-30 DIAGNOSIS — IMO0002 Reserved for concepts with insufficient information to code with codable children: Secondary | ICD-10-CM

## 2017-12-30 DIAGNOSIS — N179 Acute kidney failure, unspecified: Secondary | ICD-10-CM

## 2017-12-30 MED ORDER — ROSUVASTATIN CALCIUM 10 MG PO TABS: 10.0000 mg | ORAL_TABLET | Freq: Every day | ORAL | 3 refills | Status: DC

## 2017-12-30 MED ORDER — ASPIRIN EC 81 MG PO TBEC: 81.0000 mg | DELAYED_RELEASE_TABLET | Freq: Every day | ORAL | 0 refills | Status: DC

## 2017-12-30 MED ORDER — METFORMIN HCL 1000 MG PO TABS
1000.0000 mg | ORAL_TABLET | Freq: Two times a day (BID) | ORAL | 1 refills | Status: DC
Start: 2017-12-30 — End: 2017-12-30

## 2017-12-30 MED ORDER — LISINOPRIL 10 MG PO TABS: ORAL_TABLET | ORAL | 1 refills | Status: DC

## 2017-12-30 MED ORDER — LISINOPRIL 10 MG PO TABS: ORAL_TABLET | ORAL | 0 refills | Status: DC

## 2017-12-30 MED ORDER — METFORMIN HCL 1000 MG PO TABS: 1000.0000 mg | ORAL_TABLET | Freq: Two times a day (BID) | ORAL | 1 refills | Status: DC

## 2017-12-30 NOTE — Patient Instructions (Addendum)
  I look forward to seeing you in January in [place].    IF you received an x-ray today, you will receive an invoice from [facility]. Please contact [facility] at [phone] with questions or concerns regarding your invoice.   IF you received labwork today, you will receive an invoice from [facility]. Please contact [facility] at [phone] with questions or concerns regarding your invoice.   Our billing staff will not be able to assist you with questions regarding bills from these companies.  You will be contacted with the lab results as soon as they are available. The fastest way to get your results is to activate your My Chart account. Instructions are located on the last page of this paperwork. If you have not heard from us regarding the results in 2 weeks, please contact this office.

## 2017-12-30 NOTE — Progress Notes (Signed)
01/07/2018 1:25 PM   DOB: 1950-12-28 / MRN: [id number]  SUBJECTIVE:  Jeff [name] is a 67 y.o. male presenting for recheck chronic conditions.  He feels well today and denies complaint. [facility] and tells me his vision is stable. History of abdominal pain however this has improved with cessation of tomatoes and he no longer takes PPI.     He is allergic to flexeril [cyclobenzaprine hcl]; lidocaine; and novocain [procaine hcl].   He  has a past medical history of ADHD (attention deficit hyperactivity disorder), Diabetes mellitus, Hyperlipidemia, and Hypertension.    He  reports that he has never smoked. He has never used smokeless tobacco. He reports that he does not drink alcohol or use drugs. He  reports that he currently engages in sexual activity. The patient  has a past surgical history that includes Neck surgery and Spine surgery.  His family history includes Cancer in his brother; Diabetes in his mother; Heart disease in his father and mother.  Review of Systems  Constitutional: Negative for chills, diaphoresis and fever.  Eyes: Negative.   Respiratory: Negative for cough, hemoptysis, sputum production, shortness of breath and wheezing.   Cardiovascular: Negative for chest pain, orthopnea and leg swelling.  Gastrointestinal: Negative for abdominal pain, blood in stool, constipation, diarrhea, heartburn, melena, nausea and vomiting.  Genitourinary: Negative for dysuria, flank pain, frequency, hematuria and urgency.  Skin: Negative for rash.  Neurological: Negative for dizziness, sensory change, speech change, focal weakness and headaches.    The problem list and medications were reviewed and updated by myself where necessary and exist elsewhere in the encounter.   OBJECTIVE:  BP 117/73   Pulse 81   Temp 98.4 F (36.9 C)   Resp 16   Ht 5\' 8"  (1.727 m)   Wt 186 lb 12.8 oz (84.7 kg)   SpO2 95%   BMI 28.40 kg/m   Wt Readings from Last 3 Encounters:    12/30/17 186 lb 12.8 oz (84.7 kg)  10/23/17 188 lb 12.8 oz (85.6 kg)  10/02/17 189 lb 9.6 oz (86 kg)   Temp Readings from Last 3 Encounters:  12/30/17 98.4 F (36.9 C)  10/23/17 98.1 F (36.7 C) (Oral)  10/02/17 98.6 F (37 C) (Oral)   BP Readings from Last 3 Encounters:  12/30/17 117/73  10/23/17 98/60  10/02/17 98/62   Pulse Readings from Last 3 Encounters:  12/30/17 81  10/23/17 87  10/02/17 (!) 114    Physical Exam  Constitutional: He is oriented to person, place, and time. He appears well-developed. He does not appear ill.  Eyes: Pupils are equal, round, and reactive to light. Conjunctivae and EOM are normal.  Cardiovascular: Normal rate, regular rhythm, S1 normal, S2 normal, normal heart sounds, intact distal pulses and normal pulses. Exam reveals no gallop and no friction rub.  No murmur heard. Pulmonary/Chest: Effort normal. No stridor. No respiratory distress. He has no wheezes. He has no rales.  Abdominal: He exhibits no distension.  Musculoskeletal: Normal range of motion. He exhibits no edema.  Neurological: He is alert and oriented to person, place, and time. No cranial nerve deficit. Coordination normal.  Skin: Skin is warm and dry. He is not diaphoretic.  Psychiatric: He has a normal mood and affect.  Nursing note and vitals reviewed.   Lab Results  Component Value Date   HGBA1C 8.8 (H) 12/30/2017    Lab Results  Component Value Date   WBC  5.6 12/30/2017   HGB 15.1 12/30/2017   HCT 45.7 12/30/2017   MCV 88 12/30/2017   PLT 247 12/30/2017    Lab Results  Component Value Date   CREATININE 1.50 (H) 12/30/2017   BUN 11 12/30/2017   NA 139 12/30/2017   K 4.4 12/30/2017   CL 103 12/30/2017   CO2 21 12/30/2017    Lab Results  Component Value Date   ALT 26 12/30/2017   AST 16 12/30/2017   ALKPHOS 70 12/30/2017   BILITOT 0.4 12/30/2017    Lab Results  Component Value Date   TSH 1.915 02/27/2014    Lab Results  Component Value Date    CHOL 226 (H) 12/30/2017   HDL 40 12/30/2017   LDLCALC 150 (H) 12/30/2017   TRIG 179 (H) 12/30/2017   CHOLHDL 5.7 (H) 12/30/2017   The 10-year ASCVD risk score Denman George DC Jr., et al., 2013) is: 34.8%   Values used to calculate the score:     Age: 67 years     Sex: Male     Is Non-Hispanic African American: No     Diabetic: Yes     Tobacco smoker: No     Systolic Blood Pressure: 117 mmHg     Is BP treated: Yes     HDL Cholesterol: 40 mg/dL     Total Cholesterol: 226 mg/dL    ASSESSMENT AND PLAN:  [name] was seen today for follow-up.  Diagnoses and all orders for this visit:  Uncontrolled type 2 diabetes mellitus with complication, with long-term current use of insulin (HCC) Comments: He needs to tighten up his diet. He has an appointment with Dr. Everardo All soon.  Orders: -     Hemoglobin A1c -     Lipid panel -     metFORMIN (GLUCOPHAGE) 1000 MG tablet; Take 1 tablet (1,000 mg total) by mouth 2 (two) times daily with a meal.  Essential hypertension -     Discontinue: lisinopril (PRINIVIL,ZESTRIL) 10 MG tablet; TAKE 1 TABLET(10 MG) BY MOUTH DAILY -     CBC -     Basic metabolic panel -     Hepatic function panel -     POCT urinalysis dipstick -     lisinopril (PRINIVIL,ZESTRIL) 10 MG tablet; TAKE 1 TABLET(10 MG) BY MOUTH DAILY  At risk for acute ischemic cardiac event: Doubling crestor.  Continuing ASA. I have urged him to take control of his diabetes.  -     Discontinue: rosuvastatin (CRESTOR) 10 MG tablet; Take 1 tablet (10 mg total) by mouth daily. -     Discontinue: aspirin EC 81 MG tablet; Take 1 tablet (81 mg total) by mouth daily. -     Discontinue: rosuvastatin (CRESTOR) 10 MG tablet; Take 1 tablet (10 mg total) by mouth daily. -     aspirin EC 81 MG tablet; Take 1 tablet (81 mg total) by mouth daily. -     Discontinue: rosuvastatin (CRESTOR) 20 MG tablet; Take 0.5 tablets (10 mg total) by mouth daily. -     Discontinue: rosuvastatin (CRESTOR) 20 MG tablet; Take 0.5  tablets (10 mg total) by mouth daily. -     Discontinue: rosuvastatin (CRESTOR) 20 MG tablet; Take 1 tablet (20 mg total) by mouth daily. -     rosuvastatin (CRESTOR) 20 MG tablet; Take 1 tablet (20 mg total) by mouth daily.  AKI (acute kidney injury) (HCC) -     Basic metabolic panel; Future  Other orders -  Discontinue: metFORMIN (GLUCOPHAGE) 1000 MG tablet; Take 1 tablet (1,000 mg total) by mouth 2 (two) times daily with a meal.    The patient is advised to call or return to clinic if he does not see an improvement in symptoms, or to seek the care of the closest emergency department if he worsens with the above plan.   [name], MHS, PA-C Primary Care at [facility] 01/07/2018 1:25 PM

## 2017-12-31 LAB — CBC
HEMATOCRIT: 45.7 % (ref 37.5–51.0)
Hemoglobin: 15.1 g/dL (ref 13.0–17.7)
MCH: 29 pg (ref 26.6–33.0)
MCHC: 33 g/dL (ref 31.5–35.7)
MCV: 88 fL (ref 79–97)
Platelets: 247 10*3/uL (ref 150–450)
RBC: 5.2 x10E6/uL (ref 4.14–5.80)
RDW: 14 % (ref 12.3–15.4)
WBC: 5.6 10*3/uL (ref 3.4–10.8)

## 2017-12-31 LAB — BASIC METABOLIC PANEL
BUN / CREAT RATIO: 7 — AB (ref 10–24)
BUN: 11 mg/dL (ref 8–27)
CHLORIDE: 103 mmol/L (ref 96–106)
CO2: 21 mmol/L (ref 20–29)
Calcium: 9.6 mg/dL (ref 8.6–10.2)
Creatinine, Ser: 1.5 mg/dL — ABNORMAL HIGH (ref 0.76–1.27)
GFR calc Af Amer: 54 mL/min/{1.73_m2} — ABNORMAL LOW (ref >59)
GFR calc non Af Amer: 47 mL/min/{1.73_m2} — ABNORMAL LOW (ref >59)
GLUCOSE: 236 mg/dL — AB (ref 65–99)
POTASSIUM: 4.4 mmol/L (ref 3.5–5.2)
SODIUM: 139 mmol/L (ref 134–144)

## 2017-12-31 LAB — HEPATIC FUNCTION PANEL
ALBUMIN: 4.4 g/dL (ref 3.6–4.8)
ALT: 26 IU/L (ref 0–44)
AST: 16 IU/L (ref 0–40)
Alkaline Phosphatase: 70 IU/L (ref 39–117)
Bilirubin Total: 0.4 mg/dL (ref 0.0–1.2)
Bilirubin, Direct: 0.09 mg/dL (ref 0.00–0.40)
Total Protein: 6.9 g/dL (ref 6.0–8.5)

## 2017-12-31 LAB — LIPID PANEL
CHOL/HDL RATIO: 5.7 ratio — AB (ref 0.0–5.0)
Cholesterol, Total: 226 mg/dL — ABNORMAL HIGH (ref 100–199)
HDL: 40 mg/dL (ref >39)
LDL Calculated: 150 mg/dL — ABNORMAL HIGH (ref 0–99)
Triglycerides: 179 mg/dL — ABNORMAL HIGH (ref 0–149)
VLDL CHOLESTEROL CAL: 36 mg/dL (ref 5–40)

## 2017-12-31 LAB — HEMOGLOBIN A1C
ESTIMATED AVERAGE GLUCOSE: 206 mg/dL
HEMOGLOBIN A1C: 8.8 % — AB (ref 4.8–5.6)

## 2018-01-07 ENCOUNTER — Encounter: Payer: Self-pay | Admitting: Physician Assistant

## 2018-01-07 MED ORDER — ROSUVASTATIN CALCIUM 20 MG PO TABS: 10.0000 mg | ORAL_TABLET | Freq: Every day | ORAL | 3 refills | Status: DC

## 2018-01-07 MED ORDER — ROSUVASTATIN CALCIUM 20 MG PO TABS: 20.0000 mg | ORAL_TABLET | Freq: Every day | ORAL | 3 refills | Status: DC

## 2018-01-13 ENCOUNTER — Ambulatory Visit: Payer: BC Managed Care – PPO | Admitting: Physician Assistant

## 2018-01-13 ENCOUNTER — Ambulatory Visit (INDEPENDENT_AMBULATORY_CARE_PROVIDER_SITE_OTHER): Payer: BC Managed Care – PPO | Admitting: Physician Assistant

## 2018-01-13 DIAGNOSIS — N179 Acute kidney failure, unspecified: Secondary | ICD-10-CM

## 2018-01-13 LAB — BASIC METABOLIC PANEL
BUN/Creatinine Ratio: 12 (ref 10–24)
BUN: 15 mg/dL (ref 8–27)
CHLORIDE: 103 mmol/L (ref 96–106)
CO2: 22 mmol/L (ref 20–29)
Calcium: 9.7 mg/dL (ref 8.6–10.2)
Creatinine, Ser: 1.28 mg/dL — ABNORMAL HIGH (ref 0.76–1.27)
GFR calc Af Amer: 66 mL/min/{1.73_m2} (ref >59)
GFR calc non Af Amer: 57 mL/min/{1.73_m2} — ABNORMAL LOW (ref >59)
GLUCOSE: 111 mg/dL — AB (ref 65–99)
Potassium: 4.3 mmol/L (ref 3.5–5.2)
Sodium: 140 mmol/L (ref 134–144)

## 2018-02-13 ENCOUNTER — Telehealth: Payer: Self-pay | Admitting: Emergency Medicine

## 2018-02-13 NOTE — Telephone Encounter (Signed)
Spoke to pt and answered his questions he also stated that he has retired since last visit and has not been in because he is waiting for his medicare card but will call next week and make an appt.

## 2018-02-13 NOTE — Telephone Encounter (Signed)
Pt asked that you give them him a call back. He has some questions about his insulin and syringes. Thanks.

## 2018-02-13 NOTE — Telephone Encounter (Signed)
Called pt and lft vm to return call

## 2018-07-29 ENCOUNTER — Encounter: Payer: Self-pay | Admitting: Family Medicine

## 2018-07-29 ENCOUNTER — Other Ambulatory Visit: Payer: Self-pay

## 2018-07-29 ENCOUNTER — Ambulatory Visit: Payer: Medicare Other | Admitting: Family Medicine

## 2018-07-29 VITALS — BP 118/77 | HR 87 | Temp 98.0°F | Resp 16 | Ht 67.91 in | Wt 188.0 lb

## 2018-07-29 DIAGNOSIS — E1142 Type 2 diabetes mellitus with diabetic polyneuropathy: Secondary | ICD-10-CM

## 2018-07-29 DIAGNOSIS — G8929 Other chronic pain: Secondary | ICD-10-CM | POA: Diagnosis not present

## 2018-07-29 DIAGNOSIS — R739 Hyperglycemia, unspecified: Secondary | ICD-10-CM

## 2018-07-29 DIAGNOSIS — N183 Chronic kidney disease, stage 3 unspecified: Secondary | ICD-10-CM

## 2018-07-29 DIAGNOSIS — R1013 Epigastric pain: Secondary | ICD-10-CM | POA: Diagnosis not present

## 2018-07-29 DIAGNOSIS — R269 Unspecified abnormalities of gait and mobility: Secondary | ICD-10-CM

## 2018-07-29 DIAGNOSIS — E1122 Type 2 diabetes mellitus with diabetic chronic kidney disease: Secondary | ICD-10-CM

## 2018-07-29 DIAGNOSIS — R2689 Other abnormalities of gait and mobility: Secondary | ICD-10-CM

## 2018-07-29 DIAGNOSIS — Z794 Long term (current) use of insulin: Secondary | ICD-10-CM

## 2018-07-29 DIAGNOSIS — R6881 Early satiety: Secondary | ICD-10-CM

## 2018-07-29 DIAGNOSIS — R1902 Left upper quadrant abdominal swelling, mass and lump: Secondary | ICD-10-CM

## 2018-07-29 LAB — POCT URINALYSIS DIP (MANUAL ENTRY)
BILIRUBIN UA: NEGATIVE
BILIRUBIN UA: NEGATIVE mg/dL
Blood, UA: NEGATIVE
LEUKOCYTES UA: NEGATIVE
NITRITE UA: NEGATIVE
Protein Ur, POC: NEGATIVE mg/dL
Spec Grav, UA: 1.01 (ref 1.010–1.025)
Urobilinogen, UA: 0.2 E.U./dL
pH, UA: 5.5 (ref 5.0–8.0)

## 2018-07-29 LAB — GLUCOSE, POCT (MANUAL RESULT ENTRY): POC Glucose: 149 mg/dl — AB (ref 70–99)

## 2018-07-29 MED ORDER — SUCRALFATE 1 G PO TABS: 1.0000 g | ORAL_TABLET | Freq: Three times a day (TID) | ORAL | 0 refills | Status: DC

## 2018-07-29 MED ORDER — OMEPRAZOLE 40 MG PO CPDR: 40.0000 mg | DELAYED_RELEASE_CAPSULE | Freq: Every day | ORAL | 3 refills | Status: DC

## 2018-07-29 MED ORDER — METOCLOPRAMIDE HCL 5 MG PO TABS: 5.0000 mg | ORAL_TABLET | Freq: Three times a day (TID) | ORAL | 0 refills | Status: DC

## 2018-07-29 MED FILL — OMEPRAZOLE 40 MG CPDR: 40 | 30 days supply | Qty: 30 | Fill #0

## 2018-07-29 MED FILL — METOCLOPRAMIDE 5 MG TABLET: 5 | 20 days supply | Qty: 60 | Fill #0

## 2018-07-29 MED FILL — SUCRALFATE 1 GM TABLET: 1 | 15 days supply | Qty: 60 | Fill #0

## 2018-07-29 NOTE — Progress Notes (Signed)
Subjective:    Patient: Jeff [name]  DOB: [birth year]; 68 y.o.   MRN: [id number]  Chief Complaint  Patient presents with  . Chronic Conditions    6 month follow-up     HPI Jeff [name] is a 68 yo male with a h/o DM, HTN, HLD here to f/u on his chronic medical conditions.  This is my first time meetig this pt in 4 years.  Can't keeep food down. His stomach hurts [name] the time.  He was being followed by Jeff [name] for this and he was referred to the GI doctor but his appointment was running over an hour late so he had to leave to go to the imaging center.  This started last fall.  No body managing DM and cholesterol - he gets to impatient to wait.  CBGs are 300s in the a.m and lucky if get down to 250 during the day.  He tried to get an appointment with his managing physician Jeff [name] but he was never able to talk to someone. Uncontrolled since October.  He goes to the [place] [facility] as well who is provoding him [name] of his medications.  Is preparing his transfer his care to [facility].   He takes a chewable antiacid occ which helps for a very short period.   Abd pain is right to the right of center of epigastric.  Is intermittent but gets worse after eating - he will eat 1 bagel or 2 boiled eggs then well last [name] day. Wakes up in the middle of tnight with pain.  Urine and bowels normal. When he eats, he feels like the food just stays in stomach - a lot of early satiety - can barely eat much at [name] as food feels stuck.  We he vomits, he will be throwing up the food he just ate. No melena, no hematocehzia, no coffeeground emesis.  Milk, milkdshakes, antacids help more than anything.  He does take ozempic and after that he doesn't have any  Appetite for 2-3 days. Has been nauseated a lot and vomiting.  Not lost any weight.  Take occ tyleonol and an antacid yest but other than tat no otc meds. No nsaids.  2 mos of pepcid a year ago did not help. On metofmrin for a long time. On metformon, novolin 110u/d, and  ozempic (prior to that trulicity). Medical History Past Medical History:  Diagnosis Date  . ADHD (attention deficit hyperactivity disorder)   . Diabetes mellitus   . Hyperlipidemia   . Hypertension    Past Surgical History:  Procedure Laterality Date  . NECK SURGERY    . SPINE SURGERY     Cervical spine x 2; [employer]; [employer].   Current Outpatient Medications on File Prior to Visit  Medication Sig Dispense Refill  . aspirin EC 81 MG tablet Take 1 tablet (81 mg total) by mouth daily. 365 tablet 0  . glucose blood (ONETOUCH VERIO) test strip 1 each 2 (two) times daily by Other route. And lancets 2/day 100 each 12  . insulin NPH Human (NOVOLIN N) 100 UNIT/ML injection Inject 1.1 mLs (110 Units total) every morning into the skin. And syringes 2/day 110 mL 11  . Insulin Syringe-Needle U-100 (B-D INS SYR ULTRAFINE 1CC/31G) 31G X 5/16" 1 ML MISC Used to inject insulin twice daily. 90 each 2  . lisinopril (PRINIVIL,ZESTRIL) 10 MG tablet TAKE 1 TABLET(10 MG) BY MOUTH DAILY 90 tablet 1  . metFORMIN (GLUCOPHAGE) 1000 MG tablet Take 1 tablet (  1,000 mg total) by mouth 2 (two) times daily with a meal. 180 tablet 1  . rosuvastatin (CRESTOR) 20 MG tablet Take 1 tablet (20 mg total) by mouth daily. 90 tablet 3   No current facility-administered medications on file prior to visit.    Allergies  Allergen Reactions  . Flexeril [Cyclobenzaprine Hcl] Other (See Comments)    Causes him to pass out and lowers his BP  . Lidocaine Swelling    throat  . Novocain [Procaine Hcl] Swelling   Family History  Problem Relation Age of Onset  . Heart disease Mother        CHF  . Diabetes Mother   . Heart disease Father   . Cancer Brother    Social History   Socioeconomic History  . Marital status: Single    Spouse name: Not on file  . Number of children: Not on file  . Years of education: Not on file  . Highest education level: Not on file  Occupational History  . Occupation: Management consultant  Social Needs  . Financial resource strain: Not on file  . Food insecurity:    Worry: Not on file    Inability: Not on file  . Transportation needs:    Medical: Not on file    Non-medical: Not on file  Tobacco Use  . Smoking status: Never Smoker  . Smokeless tobacco: Never Used  Substance and Sexual Activity  . Alcohol use: No    Alcohol/week: 0.0 standard drinks  . Drug use: No  . Sexual activity: Yes  Lifestyle  . Physical activity:    Days per week: Not on file    Minutes per session: Not on file  . Stress: Not on file  Relationships  . Social connections:    Talks on phone: Not on file    Gets together: Not on file    Attends religious service: Not on file    Active member of club or organization: Not on file    Attends meetings of clubs or organizations: Not on file    Relationship status: Not on file  Other Topics Concern  . Not on file  Social History Narrative  . Not on file   Depression screen [facility] 2/9 12/30/2017 10/23/2017 10/02/2017 09/30/2017 09/09/2017  Decreased Interest 0 0 0 0 0  Down, Depressed, Hopeless 0 0 0 0 0  PHQ - 2 Score 0 0 0 0 0    ROS As noted in HPI  Objective:  BP 118/77   Pulse 87   Temp 98 F (36.7 C) (Oral)   Resp 16   Ht 5' 7.91" (1.725 m)   Wt 188 lb (85.3 kg)   SpO2 95%   BMI 28.66 kg/m  Physical Exam Constitutional:      General: He is not in acute distress.    Appearance: He is well-developed. He is not diaphoretic.  HENT:     Head: Normocephalic and atraumatic.  Eyes:     General: No scleral icterus.    Conjunctiva/sclera: Conjunctivae normal.     Pupils: Pupils are equal, round, and reactive to light.  Neck:     Musculoskeletal: Normal range of motion and neck supple.     Thyroid: No thyromegaly.  Cardiovascular:     Rate and Rhythm: Normal rate and regular rhythm.     Heart sounds: Normal heart sounds.  Pulmonary:     Effort: Pulmonary effort is normal. No respiratory distress.     Breath  sounds:  Normal breath sounds.  Abdominal:     General: Abdomen is protuberant. Bowel sounds are normal. There is distension (mild, firm).     Palpations: There is splenomegaly (firm right upper quandrant mass palpable). There is no hepatomegaly.     Tenderness: There is abdominal tenderness in the right upper quadrant and epigastric area. There is no guarding. Negative signs include Murphy's sign and McBurney's sign.  Lymphadenopathy:     Cervical: No cervical adenopathy.  Skin:    General: Skin is warm and dry.  Neurological:     Mental Status: He is alert and oriented to person, place, and time.  Psychiatric:        Behavior: Behavior normal.     POC TESTING Office Visit on 07/29/2018  Component Date Value Ref Range Status  . WBC 07/29/2018 7.6  3.4 - 10.8 x10E3/uL Final  . RBC 07/29/2018 5.55  4.14 - 5.80 x10E6/uL Final  . Hemoglobin 07/29/2018 15.9  13.0 - 17.7 g/dL Final  . Hematocrit 16/03/9603 46.4  37.5 - 51.0 % Final  . MCV 07/29/2018 84  79 - 97 fL Final  . MCH 07/29/2018 28.6  26.6 - 33.0 pg Final  . MCHC 07/29/2018 34.3  31.5 - 35.7 g/dL Final  . RDW 54/02/8118 13.2  11.6 - 15.4 % Final  . Platelets 07/29/2018 294  150 - 450 x10E3/uL Final  . Neutrophils 07/29/2018 60  Not Estab. % Final  . Lymphs 07/29/2018 25  Not Estab. % Final  . Monocytes 07/29/2018 8  Not Estab. % Final  . Eos 07/29/2018 6  Not Estab. % Final  . Basos 07/29/2018 1  Not Estab. % Final  . Neutrophils Absolute 07/29/2018 4.5  1.4 - 7.0 x10E3/uL Final  . Lymphocytes Absolute 07/29/2018 1.9  0.7 - 3.1 x10E3/uL Final  . Monocytes Absolute 07/29/2018 0.6  0.1 - 0.9 x10E3/uL Final  . EOS (ABSOLUTE) 07/29/2018 0.5* 0.0 - 0.4 x10E3/uL Final  . Basophils Absolute 07/29/2018 0.1  0.0 - 0.2 x10E3/uL Final  . Immature Granulocytes 07/29/2018 0  Not Estab. % Final  . Immature Grans (Abs) 07/29/2018 0.0  0.0 - 0.1 x10E3/uL Final  . Glucose 07/29/2018 168* 65 - 99 mg/dL Final  . BUN 14/78/2956 13  8 - 27 mg/dL Final   . Creatinine, Ser 07/29/2018 1.47* 0.76 - 1.27 mg/dL Final  . GFR calc non Af Amer 07/29/2018 48* >59 mL/min/1.73 Final  . GFR calc Af Amer 07/29/2018 55* >59 mL/min/1.73 Final  . BUN/Creatinine Ratio 07/29/2018 9* 10 - 24 Final  . Sodium 07/29/2018 139  134 - 144 mmol/L Final  . Potassium 07/29/2018 4.3  3.5 - 5.2 mmol/L Final  . Chloride 07/29/2018 102  96 - 106 mmol/L Final  . CO2 07/29/2018 22  20 - 29 mmol/L Final  . Calcium 07/29/2018 10.0  8.6 - 10.2 mg/dL Final  . Total Protein 07/29/2018 7.3  6.0 - 8.5 g/dL Final  . Albumin 21/30/8657 4.6  3.8 - 4.8 g/dL Final                 **Please note reference interval change**  . Globulin, Total 07/29/2018 2.7  1.5 - 4.5 g/dL Final  . Albumin/Globulin Ratio 07/29/2018 1.7  1.2 - 2.2 Final  . Bilirubin Total 07/29/2018 0.4  0.0 - 1.2 mg/dL Final  . Alkaline Phosphatase 07/29/2018 77  39 - 117 IU/L Final  . AST 07/29/2018 25  0 - 40 IU/L Final  . ALT 07/29/2018 34  0 - 44 IU/L Final  . POC Glucose 07/29/2018 149* 70 - 99 mg/dl Final  . Color, UA 92/42/6834 yellow  yellow Final  . Clarity, UA 07/29/2018 clear  clear Final  . Glucose, UA 07/29/2018 =500* negative mg/dL Final  . Bilirubin, UA 07/29/2018 negative  negative Final  . Ketones, POC UA 07/29/2018 negative  negative mg/dL Final  . Spec Grav, UA 07/29/2018 1.010  1.010 - 1.025 Final  . Blood, UA 07/29/2018 negative  negative Final  . pH, UA 07/29/2018 5.5  5.0 - 8.0 Final  . Protein Ur, POC 07/29/2018 negative  negative mg/dL Final  . Urobilinogen, UA 07/29/2018 0.2  0.2 or 1.0 E.U./dL Final  . Nitrite, UA 19/62/2297 Negative  Negative Final  . Leukocytes, UA 07/29/2018 Negative  Negative Final  . Lipase 07/29/2018 134* 13 - 78 U/L Final  . Amylase 07/29/2018 94  31 - 110 U/L Final     Assessment & Plan:   1. Abdominal pain, chronic, epigastric   2. Type 2 diabetes mellitus with stage 3 chronic kidney disease, with long-term current use of insulin (HCC)   3. Gait  difficulty   4. Imbalance   5. Diabetic polyneuropathy associated with type 2 diabetes mellitus (HCC)   6. Early satiety   7. Abdominal mass, LUQ (left upper quadrant)   8. Elevated blood sugar     Patient will continue on current chronic medications other than changes noted above, so ok to refill when needed.   See after visit summary for patient specific instructions.  Orders Placed This Encounter  Procedures  . CBC with Differential/Platelet  . Comprehensive metabolic panel    Order Specific Question:   Has the patient fasted?    Answer:   No  . Lipase  . Amylase  . H. pylori breath test  . Ambulatory referral to Gastroenterology    Referral Priority:   Routine    Referral Type:   Consultation    Referral Reason:   Specialty Services Required    Number of Visits Requested:   1  . Ambulatory referral to Neurology    Referral Priority:   Routine    Referral Type:   Consultation    Referral Reason:   Specialty Services Required    Requested Specialty:   Neurology    Number of Visits Requested:   1  . Ambulatory referral to Endocrinology    Referral Priority:   Routine    Referral Type:   Consultation    Referral Reason:   Specialty Services Required    Number of Visits Requested:   1  . POCT glucose (manual entry)  . POCT urinalysis dipstick    Meds ordered this encounter  Medications  . omeprazole (PRILOSEC) 40 MG capsule    Sig: Take 1 capsule (40 mg total) by mouth daily. 30 minutes before breakfast    Dispense:  30 capsule    Refill:  3  . sucralfate (CARAFATE) 1 g tablet    Sig: Take 1 tablet (1 g total) by mouth 4 (four) times daily -  with meals and at bedtime.    Dispense:  60 tablet    Refill:  0  . metoCLOPramide (REGLAN) 5 MG tablet    Sig: Take 1 tablet (5 mg total) by mouth 3 (three) times daily before meals.    Dispense:  60 tablet    Refill:  0    Patient verbalized to me that they understand the following: diagnosis, what is  being done for  them, what to expect and what should be done at home.  Their questions have been answered. They understand that I am unable to predict every possible medication interaction or adverse outcome and that if any unexpected symptoms arise, they should contact us and their pharmacist, as well as never hesitate to seek urgent/emergent care at [facility] or ER if they think it might be warranted.    Norberto Sorenson, MD, MPH Primary Care at [facility] [address] [place], [place]  [address] [phone] Office phone  [phone] Office fax  07/29/18 9:18 AM

## 2018-07-29 NOTE — Patient Instructions (Addendum)
If you have lab work done today you will be contacted with your lab results within the next 2 weeks.  If you have not heard from [place] then please contact us. The fastest way to get your results is to register for My Chart.   IF you received an x-ray today, you will receive an invoice from [facility]. Please contact [facility] at [phone] with questions or concerns regarding your invoice.   IF you received labwork today, you will receive an invoice from [place] [facility]. Please contact [facility] at [phone] with questions or concerns regarding your invoice.   Our billing staff will not be able to assist you with questions regarding bills from these companies.  You will be contacted with the lab results as soon as they are available. The fastest way to get your results is to activate your My Chart account. Instructions are located on the last page of this paperwork. If you have not heard from [place] regarding the results in 2 weeks, please contact this office.     Recommend establishing with [facility] - Dr. Bernerd Limbo, Harrison Mons, Windell Hummingbird.  Sliding scale rapid insulin: If CBG 150-199, Give self 5u rapid insulin CBG 200-249 - give 10u rapid insulin CBG 250-299 - give 15u of rapid CBG 300-349 - 20u CBG 350 - 400 - 25u  Check your cbg 1 hour later - if<100 - eat something,  If not, just wait and check again 2 hours later and use the rapid insulin again according to the sliding scale.   Correction Insulin  Correction insulin, also called corrective insulin or a supplemental dose, is a small amount of insulin that can be used to lower your blood sugar (glucose) if it is too high. You may be instructed to check your blood glucose at certain times of the day and to use correction insulin as needed to lower your blood glucose to your target range. Correction insulin is primarily used as part of diabetes management. It may also be prescribed for people  who do not have diabetes. What is a correction scale? A correction scale, also called a sliding scale, is prescribed by your health care provider to help you determine when you need correction insulin. Your correction scale is based on your individual treatment goals, and it has two parts:  Ranges of blood glucose levels.  How much correction insulin to give yourself if your blood sugar falls within a certain range. If your blood glucose is in your desired range, you will not need correction insulin and you should take your normal insulin dose. What type of insulin do I need? Your health care provider may prescribe rapid-acting or short-acting insulin for you to use as correction insulin. Rapid-acting insulin:  Starts working quickly, in as little as 5 minutes.  Can last for 3-6 hours.  Works well when taken right before a meal to quickly lower blood glucose. Short-acting insulin:  Starts working in about 30 minutes.  Can last for 6-8 hours.  Should be taken about 30 minutes before you start eating a meal. Talk with your health care provider or pharmacist about which type of correction insulin to take and when to take it. If you use insulin to control your diabetes, you should use correction insulin in addition to the longer-acting (basal) insulin that you normally use. How do I manage my blood glucose with correction insulin? Giving a correction dose  Check your blood glucose as directed by your  health care provider.  Use your correction scale to find the range that your blood glucose is in.  Identify the units of insulin that match your blood glucose range.  Make sure you have food available that you can eat in the next 15-30 minutes, after your correction dose.  Give yourself the dose of correction insulin that your health care provider has prescribed in your correction scale. Always make sure you are using the right type of insulin. ? If your correction insulin is  rapid-acting, start eating a meal within 15 minutes after your correction dose to keep your blood glucose from getting too low. ? If your correction insulin is short-acting, start eating a meal within 30 minutes after your correction dose to keep your blood glucose from getting too low. Keeping a blood glucose log   Write down your blood glucose test results and the amount of insulin that you give yourself. Do this every time you check blood glucose or take insulin. Bring this log with you to your medical visits. This information will help your health care provider to manage your medicines.  Note anything that may affect your blood glucose, such as: ? Changes in normal exercise or activity. ? Changes in your normal schedule, such as changes in your sleep routine, going on vacation, changing your diet, or holidays. ? New over-the-counter or prescription medicines. ? Illness, stress, or anxiety. ? Changes in the time that you took your medicine or insulin. ? Changes in your meals, such as skipping a meal, having a late meal, or dining out. ? Eating things that may affect blood glucose, such as snacks, meal portions that are larger than normal, drinks that contain sugar, or eating less than usual. What do I need to know about hyperglycemia and hypoglycemia? What is hyperglycemia? Hyperglycemia, also called high blood glucose, occurs when blood glucose is too high. Make sure you know the early signs of hyperglycemia, such as:  Increased thirst.  Hunger.  Feeling very tired.  Needing to urinate more often than usual.  Blurry vision. What is hypoglycemia? Hypoglycemia is also called low blood glucose. Be aware of "stacking" your insulin doses. This happens when you correct a high blood glucose by giving yourself extra insulin too soon after a previous correction dose or mealtime dose. This may cause you to have too much insulin in your body and may put you at risk for hypoglycemia.  Hypoglycemia occurs with a blood glucose level at or below 70 mg/dL (3.9 mmol/L). It is important to know the symptoms of hypoglycemia and treat it right away. Always have a 15-gram rapid-acting carbohydrate snack with you to treat low blood glucose. Family members and close friends should also know the symptoms and should understand how to treat hypoglycemia, in case you are not able to treat yourself. What are the symptoms of hypoglycemia? Hypoglycemia symptoms can include:  Hunger.  Anxiety.  Sweating and feeling clammy.  Confusion.  Dizziness or light-headedness.  Sleepiness.  Nausea.  Increased heart rate.  Headache.  Blurry vision.  Jerky movements that you cannot control (seizure).  Nightmares.  Tingling or numbness around the mouth, lips, or tongue.  A change in speech.  Decreased ability to concentrate.  A change in coordination.  Restless sleep.  Tremors or shakes.  Fainting.  Irritability. How do I treat hypoglycemia? If you are alert and able to swallow safely, follow the 15:15 rule:  Take 15 grams of a rapid-acting carbohydrate. Rapid-acting options include: ? 1 tube of  glucose gel. ? 3 glucose pills. ? 6-8 pieces of hard candy. ? 4 oz (120 mL) of fruit juice. ? 4 oz (120 mL) of regular (not diet) soda.  Check your blood glucose 15 minutes after you take the carbohydrate. ? If the repeat blood glucose level is still at or below 70 mg/dL (3.9 mmol/L), take 15 grams of a carbohydrate again. ? If your blood glucose level does not increase above 70 mg/dL (3.9 mmol/L) after 3 tries, seek emergency medical care.  After your blood glucose level returns to normal, eat a meal or a snack within 1 hour.  How do I treat severe hypoglycemia? Severe hypoglycemia is when your blood glucose level is at or below 54 mg/dL (3 mmol/L). Severe hypoglycemia is an emergency. Do not wait to see if the symptoms will go away. Get medical help right away. Call your  local emergency services (911 in the [place] Do not drive yourself to the hospital. If you have severe hypoglycemia and you cannot eat or drink, you may need an injection of glucagon. A family member or close friend should learn how to check your blood glucose and how to give you a glucagon injection. Ask your health care provider if you need to have an emergency glucagon injection kit available. Severe hypoglycemia may need to be treated in a hospital. The treatment may include getting glucose through an IV tube. You may also need treatment for the cause of your hypoglycemia. Why do I need correction insulin if I do not have diabetes? If you do not have diabetes, your health care provider may prescribe insulin because:  Keeping your blood glucose in the target range is important for your overall health.  You are taking medicines that cause your blood glucose to be higher than normal. Contact a health care provider if:  You develop low blood glucose that you are not able to treat yourself.  You have high blood glucose that you are not able to correct with correction insulin.  Your blood glucose is often too low.  You used emergency glucagon to treat low blood glucose. Get help right away if:  You become unresponsive. If this happens, someone else should call emergency services (911 in the [place] right away.  Your blood glucose is lower than 54 mg/dL (3.0 mmol/L).  You become confused or you have trouble thinking clearly.  You have difficulty breathing. Summary  Correction insulin is a small amount of insulin that can be used to lower your blood sugar (glucose) if it is too high.  Talk with your health care provider or pharmacist about which type of correction insulin to take and when to take it. If you use insulin to control your diabetes, you should use correction insulin in addition to the longer-acting (basal) insulin that you normally use.  You may be instructed to check your blood  glucose at certain times of the day and to use correction insulin as needed to lower your blood glucose to your target range. Always keep a log of your blood glucose values and the amount of insulin that you used.  It is important to know the symptoms of hypoglycemia and treat it right away. Always have a 15-gram rapid-acting carbohydrate snack with you to treat low blood glucose. Family members and close friends should also know the symptoms and should understand how to treat hypoglycemia, in case you are not able to treat yourself. This information is not intended to replace advice given to you by  your health care provider. Make sure you discuss any questions you have with your health care provider. Document Released: 10/12/2010 Document Revised: 02/17/2016 Document Reviewed: 02/17/2016 Elsevier Interactive Patient Education  2019 [place].   Peptic Ulcer Eating Plan Peptic ulcers are sores that form on the lining of the stomach, esophagus, or the part of the small intestine that is attached to the stomach (duodenum). These sores are also called stomach ulcers. When ulcers develop, they can cause a burning feeling in the stomach as well as bloating, nausea, vomiting, and poor appetite. If you have a history of peptic ulcers, it is important to keep track of what foods and drinks cause symptoms. What are tips for following this plan?   Eat a healthy, well-balanced diet. This includes: ? Fresh fruits and vegetables. Eat a variety of colors of fruits and vegetables. ? Whole grains. Try to make sure at least half of the grains you eat each day are whole grains. ? Low-fat dairy. ? Lean meat, fish, poultry, eggs, beans, and nuts. ? Healthy fats, such as olive oil, grapeseed oil, or canola oil. Try to eat less than 8 teaspoons of fats and oils each day.  Avoid foods that cause irritation or pain. These may be different for different people. Keep a food diary to identify foods that cause  symptoms.  Avoid processed foods that have added salt and sugar.  Avoid drinking alcohol.  Avoid drinks with caffeine, such as cola, black tea, energy drinks, and coffee. Recommended foods Grains  Whole grains. Vegetables  All fresh or frozen vegetables. Low-sodium canned vegetables. Fruits  All fresh, frozen, or dried fruit. Fruit canned in juice. Meats and other protein foods  Lean cuts of meat. Skinless poultry. Fresh or canned fish. Eggs. Tofu. Nuts and nut butter. Dried beans. Low-sodium canned beans. Dairy  Low-fat or nonfat (skim) milk. Nonfat or low-fat yogurt. Nonfat or low-fat cheese. Beverages  Water. Soy or nut milks. Caffeine-free soft drinks. Herbal tea. Fats and oils  Olive oil. Canola oil. Grapeseed oil. Sunflower oil. Seasoning and other foods  Low-fat salad dressing. Ketchup. Low-fat mayonnaise. All spices except pepper. Low-sodium seasoning mixes. Foods to avoid Meats and other protein foods  Fatty meats. Fried meats. Any meat that causes symptoms. Dairy  Whole milk. Ice cream. Cream. Chocolate milk. Beverages  Alcohol. Coffee. Cola and energy drinks. Black or green tea. Cocoa. Fats and oils  Butter. Lard. Ghee. Seasoning and other foods  Pepper. Hot sauce. Any seasonings or condiments that cause symptoms. Summary  Peptic ulcers can cause burning in the stomach as well as bloating, nausea, vomiting, and poor appetite. You may be able to limit symptoms by avoiding foods that make you feel worse.  Work with your dietitian or health care provider to identify foods that cause symptoms. This may include caffeinated drinks, alcohol, or pepper. This information is not intended to replace advice given to you by your health care provider. Make sure you discuss any questions you have with your health care provider. Document Released: 08/13/2011 Document Revised: 07/02/2016 Document Reviewed: 07/02/2016 Elsevier Interactive Patient Education  2019  [employer].

## 2018-07-30 ENCOUNTER — Telehealth: Payer: Self-pay | Admitting: Family Medicine

## 2018-07-30 LAB — COMPREHENSIVE METABOLIC PANEL
ALBUMIN: 4.6 g/dL (ref 3.8–4.8)
ALT: 34 IU/L (ref 0–44)
AST: 25 IU/L (ref 0–40)
Albumin/Globulin Ratio: 1.7 (ref 1.2–2.2)
Alkaline Phosphatase: 77 IU/L (ref 39–117)
BUN / CREAT RATIO: 9 — AB (ref 10–24)
BUN: 13 mg/dL (ref 8–27)
Bilirubin Total: 0.4 mg/dL (ref 0.0–1.2)
CO2: 22 mmol/L (ref 20–29)
CREATININE: 1.47 mg/dL — AB (ref 0.76–1.27)
Calcium: 10 mg/dL (ref 8.6–10.2)
Chloride: 102 mmol/L (ref 96–106)
GFR, EST AFRICAN AMERICAN: 55 mL/min/{1.73_m2} — AB (ref >59)
GFR, EST NON AFRICAN AMERICAN: 48 mL/min/{1.73_m2} — AB (ref >59)
GLOBULIN, TOTAL: 2.7 g/dL (ref 1.5–4.5)
GLUCOSE: 168 mg/dL — AB (ref 65–99)
Potassium: 4.3 mmol/L (ref 3.5–5.2)
SODIUM: 139 mmol/L (ref 134–144)
TOTAL PROTEIN: 7.3 g/dL (ref 6.0–8.5)

## 2018-07-30 LAB — CBC WITH DIFFERENTIAL/PLATELET
BASOS ABS: 0.1 10*3/uL (ref 0.0–0.2)
Basos: 1 %
EOS (ABSOLUTE): 0.5 10*3/uL — ABNORMAL HIGH (ref 0.0–0.4)
Eos: 6 %
HEMOGLOBIN: 15.9 g/dL (ref 13.0–17.7)
Hematocrit: 46.4 % (ref 37.5–51.0)
IMMATURE GRANS (ABS): 0 10*3/uL (ref 0.0–0.1)
IMMATURE GRANULOCYTES: 0 %
LYMPHS: 25 %
Lymphocytes Absolute: 1.9 10*3/uL (ref 0.7–3.1)
MCH: 28.6 pg (ref 26.6–33.0)
MCHC: 34.3 g/dL (ref 31.5–35.7)
MCV: 84 fL (ref 79–97)
MONOCYTES: 8 %
Monocytes Absolute: 0.6 10*3/uL (ref 0.1–0.9)
Neutrophils Absolute: 4.5 10*3/uL (ref 1.4–7.0)
Neutrophils: 60 %
Platelets: 294 10*3/uL (ref 150–450)
RBC: 5.55 x10E6/uL (ref 4.14–5.80)
RDW: 13.2 % (ref 11.6–15.4)
WBC: 7.6 10*3/uL (ref 3.4–10.8)

## 2018-07-30 LAB — LIPASE: Lipase: 134 U/L — ABNORMAL HIGH (ref 13–78)

## 2018-07-30 LAB — AMYLASE: AMYLASE: 94 U/L (ref 31–110)

## 2018-07-30 LAB — H. PYLORI BREATH COLLECTION

## 2018-07-30 LAB — H. PYLORI BREATH TEST: H pylori Breath Test: NEGATIVE

## 2018-07-30 NOTE — Telephone Encounter (Signed)
Copied from CRM [id number]. Topic: Quick Communication - Rx Refill/Question >> Jul 30, 2018 11:24 AM Crist Infante wrote: Medication: omeprazole (PRILOSEC) 40 MG capsule sucralfate (CARAFATE) 1 g tablet metoCLOPramide (REGLAN) 5 MG tablet  Pt states he gets ALL his meds at the [place] because they are free.  Pt states these were sent to [facility] by mistake. Pt would like paper scripts printed so he can hand deliver to the [facility] pharmacy. Pt would like a call back when they are ready for pick up. Also, the dr information needs to be on each script.

## 2018-08-01 ENCOUNTER — Encounter: Payer: Self-pay | Admitting: Nurse Practitioner

## 2018-08-06 ENCOUNTER — Encounter: Payer: Self-pay | Admitting: Endocrinology

## 2018-08-06 ENCOUNTER — Ambulatory Visit (INDEPENDENT_AMBULATORY_CARE_PROVIDER_SITE_OTHER): Payer: Medicare Other | Admitting: Endocrinology

## 2018-08-06 ENCOUNTER — Other Ambulatory Visit: Payer: Self-pay

## 2018-08-06 VITALS — BP 110/78 | HR 88 | Ht 68.0 in | Wt 188.0 lb

## 2018-08-06 DIAGNOSIS — N183 Chronic kidney disease, stage 3 (moderate): Secondary | ICD-10-CM | POA: Diagnosis not present

## 2018-08-06 DIAGNOSIS — Z794 Long term (current) use of insulin: Secondary | ICD-10-CM | POA: Diagnosis not present

## 2018-08-06 DIAGNOSIS — E1122 Type 2 diabetes mellitus with diabetic chronic kidney disease: Secondary | ICD-10-CM | POA: Diagnosis not present

## 2018-08-06 LAB — POCT GLYCOSYLATED HEMOGLOBIN (HGB A1C): Hemoglobin A1C: 10.1 % — AB (ref 4.0–5.6)

## 2018-08-06 NOTE — Progress Notes (Signed)
Subjective:    Patient ID: Jeff [name], male    DOB: 05/25/1951, 68 y.o.   MRN: [id number]  HPI Pt returns for f/u of diabetes mellitus:  DM type: Insulin-requiring type 2.  Dx'ed: 2005 Complications: painful polyneuropathy, PDR, and renal insufficiency.   Therapy: insulin since 2016, metformin, and Ozempic.  DKA: never.   Severe hypoglycemia: never.   Pancreatitis: never.  Other: he used V-GO-40 pump 2016-2018--he stopped due to high dosage requirement; he declines multiple daily injections; he takes NPH qam, due to pattern of cbg's; wife gives insulin, due to his poor vision.  Interval history: He brings his meter, with his cbg's which I have reviewed today. cbg's vary from 106-448.  It is in general higher as the day goes on.  pt states he feels well in general.  Pt says he misses the insulin approx twice per week.  Past Medical History:  Diagnosis Date  . ADHD (attention deficit hyperactivity disorder)   . Diabetes mellitus   . Hyperlipidemia   . Hypertension     Past Surgical History:  Procedure Laterality Date  . NECK SURGERY    . SPINE SURGERY     Cervical spine x 2; [place]; [employer].    Social History   Socioeconomic History  . Marital status: Single    Spouse name: Not on file  . Number of children: Not on file  . Years of education: Not on file  . Highest education level: Not on file  Occupational History  . Occupation: Archivist  Social Needs  . Financial resource strain: Not on file  . Food insecurity:    Worry: Not on file    Inability: Not on file  . Transportation needs:    Medical: Not on file    Non-medical: Not on file  Tobacco Use  . Smoking status: Never Smoker  . Smokeless tobacco: Never Used  Substance and Sexual Activity  . Alcohol use: No    Alcohol/week: 0.0 standard drinks  . Drug use: No  . Sexual activity: Yes  Lifestyle  . Physical activity:    Days per week: Not on file    Minutes per session: Not on file   . Stress: Not on file  Relationships  . Social connections:    Talks on phone: Not on file    Gets together: Not on file    Attends religious service: Not on file    Active member of club or organization: Not on file    Attends meetings of clubs or organizations: Not on file    Relationship status: Not on file  . Intimate partner violence:    Fear of current or ex partner: Not on file    Emotionally abused: Not on file    Physically abused: Not on file    Forced sexual activity: Not on file  Other Topics Concern  . Not on file  Social History Narrative  . Not on file    No current facility-administered medications on file prior to visit.    Current Outpatient Medications on File Prior to Visit  Medication Sig Dispense Refill  . aspirin EC 81 MG tablet Take 1 tablet (81 mg total) by mouth daily. 365 tablet 0  . glucose blood (ONETOUCH VERIO) test strip 1 each 2 (two) times daily by Other route. And lancets 2/day 100 each 12  . insulin NPH Human (NOVOLIN N) 100 UNIT/ML injection Inject 1.1 mLs (110 Units total) every morning into the skin.  And syringes 2/day 110 mL 11  . Insulin Syringe-Needle U-100 (B-D INS SYR ULTRAFINE 1CC/31G) 31G X 5/16" 1 ML MISC Used to inject insulin twice daily. 90 each 2  . lisinopril (PRINIVIL,ZESTRIL) 10 MG tablet TAKE 1 TABLET(10 MG) BY MOUTH DAILY 90 tablet 1  . metFORMIN (GLUCOPHAGE) 1000 MG tablet Take 1 tablet (1,000 mg total) by mouth 2 (two) times daily with a meal. 180 tablet 1  . metoCLOPramide (REGLAN) 5 MG tablet Take 1 tablet (5 mg total) by mouth 3 (three) times daily before meals. 60 tablet 0  . omeprazole (PRILOSEC) 40 MG capsule Take 1 capsule (40 mg total) by mouth daily. 30 minutes before breakfast 30 capsule 3  . rosuvastatin (CRESTOR) 20 MG tablet Take 1 tablet (20 mg total) by mouth daily. 90 tablet 3  . sucralfate (CARAFATE) 1 g tablet Take 1 tablet (1 g total) by mouth 4 (four) times daily -  with meals and at bedtime. 60 tablet 0      Allergies  Allergen Reactions  . Flexeril [Cyclobenzaprine Hcl] Other (See Comments)    Causes him to pass out and lowers his BP  . Lidocaine Swelling    throat  . Novocain [Procaine Hcl] Swelling    Family History  Problem Relation Age of Onset  . Heart disease Mother        CHF  . Diabetes Mother   . Heart disease Father   . Cancer Brother     BP 110/78 (BP Location: Left Arm, Patient Position: Sitting, Cuff Size: Normal)   Pulse 88   Ht 5\' 8"  (1.727 m)   Wt 188 lb (85.3 kg)   SpO2 98%   BMI 28.59 kg/m   Review of Systems He denies recent hypoglycemia.      Objective:   Physical Exam VITAL SIGNS:  See vs page GENERAL: no distress Pulses: dorsalis pedis intact bilat.   MSK: no deformity of the feet CV: no leg edema Skin:  no ulcer on the feet.  normal color and temp on the feet. Neuro: sensation is intact to touch on the feet.   A1c=10.1%  Lab Results  Component Value Date   CREATININE 1.47 (H) 07/29/2018   BUN 13 07/29/2018   NA 139 07/29/2018   K 4.3 07/29/2018   CL 102 07/29/2018   CO2 22 07/29/2018       Assessment & Plan:  Insulin-requiring type 2 DM, with renal insuff: worse.   Noncompliance with insulin.  We discussed.    Patient Instructions  Please continue the same insulin.  Please make a big push to never miss the insulin.  check your blood sugar twice a day.  vary the time of day when you check, between before the 3 meals, and at bedtime.  also check if you have symptoms of your blood sugar being too high or too low.  please keep a record of the readings and bring it to your next appointment here (or you can bring the meter itself).  You can write it on any piece of paper.  please call us sooner if your blood sugar goes below 70, or if you have a lot of readings over 200. Please come back for a follow-up appointment in 2 months.

## 2018-08-06 NOTE — Telephone Encounter (Signed)
Spoke with [place] who spoke with [place] on how to handle matter. Will be following up with Creta Levin on how to approach.

## 2018-08-06 NOTE — Patient Instructions (Signed)
Please continue the same insulin.  Please make a big push to never miss the insulin.  check your blood sugar twice a day.  vary the time of day when you check, between before the 3 meals, and at bedtime.  also check if you have symptoms of your blood sugar being too high or too low.  please keep a record of the readings and bring it to your next appointment here (or you can bring the meter itself).  You can write it on any piece of paper.  please call us sooner if your blood sugar goes below 70, or if you have a lot of readings over 200. Please come back for a follow-up appointment in 2 months.

## 2018-08-08 ENCOUNTER — Emergency Department (HOSPITAL_COMMUNITY): Payer: No Typology Code available for payment source

## 2018-08-08 ENCOUNTER — Ambulatory Visit: Payer: Self-pay

## 2018-08-08 ENCOUNTER — Inpatient Hospital Stay (HOSPITAL_COMMUNITY)
Admission: EM | Admit: 2018-08-08 | Discharge: 2018-08-12 | DRG: 062 | Disposition: A | Discharge status: Rehabilitation Facility | Payer: No Typology Code available for payment source | Attending: Neurology | Admitting: Neurology

## 2018-08-08 ENCOUNTER — Other Ambulatory Visit: Payer: Self-pay

## 2018-08-08 ENCOUNTER — Encounter (HOSPITAL_COMMUNITY): Payer: Self-pay | Admitting: Emergency Medicine

## 2018-08-08 ENCOUNTER — Inpatient Hospital Stay (HOSPITAL_COMMUNITY): Payer: No Typology Code available for payment source

## 2018-08-08 DIAGNOSIS — G463 Brain stem stroke syndrome: Secondary | ICD-10-CM | POA: Diagnosis not present

## 2018-08-08 DIAGNOSIS — Z7982 Long term (current) use of aspirin: Secondary | ICD-10-CM

## 2018-08-08 DIAGNOSIS — Z8249 Family history of ischemic heart disease and other diseases of the circulatory system: Secondary | ICD-10-CM

## 2018-08-08 DIAGNOSIS — K279 Peptic ulcer, site unspecified, unspecified as acute or chronic, without hemorrhage or perforation: Secondary | ICD-10-CM | POA: Diagnosis present

## 2018-08-08 DIAGNOSIS — R29704 NIHSS score 4: Secondary | ICD-10-CM | POA: Diagnosis present

## 2018-08-08 DIAGNOSIS — Y752 Prosthetic and other implants, materials and neurological devices associated with adverse incidents: Secondary | ICD-10-CM | POA: Diagnosis present

## 2018-08-08 DIAGNOSIS — Y838 Other surgical procedures as the cause of abnormal reaction of the patient, or of later complication, without mention of misadventure at the time of the procedure: Secondary | ICD-10-CM | POA: Diagnosis present

## 2018-08-08 DIAGNOSIS — Z981 Arthrodesis status: Secondary | ICD-10-CM | POA: Diagnosis not present

## 2018-08-08 DIAGNOSIS — E876 Hypokalemia: Secondary | ICD-10-CM | POA: Diagnosis not present

## 2018-08-08 DIAGNOSIS — E1143 Type 2 diabetes mellitus with diabetic autonomic (poly)neuropathy: Secondary | ICD-10-CM | POA: Diagnosis present

## 2018-08-08 DIAGNOSIS — I639 Cerebral infarction, unspecified: Secondary | ICD-10-CM | POA: Diagnosis not present

## 2018-08-08 DIAGNOSIS — R260 Ataxic gait: Secondary | ICD-10-CM | POA: Diagnosis present

## 2018-08-08 DIAGNOSIS — M960 Pseudarthrosis after fusion or arthrodesis: Secondary | ICD-10-CM | POA: Diagnosis present

## 2018-08-08 DIAGNOSIS — M278 Other specified diseases of jaws: Secondary | ICD-10-CM | POA: Diagnosis not present

## 2018-08-08 DIAGNOSIS — R739 Hyperglycemia, unspecified: Secondary | ICD-10-CM | POA: Diagnosis not present

## 2018-08-08 DIAGNOSIS — I129 Hypertensive chronic kidney disease with stage 1 through stage 4 chronic kidney disease, or unspecified chronic kidney disease: Secondary | ICD-10-CM | POA: Diagnosis present

## 2018-08-08 DIAGNOSIS — Z888 Allergy status to other drugs, medicaments and biological substances status: Secondary | ICD-10-CM

## 2018-08-08 DIAGNOSIS — E0821 Diabetes mellitus due to underlying condition with diabetic nephropathy: Secondary | ICD-10-CM | POA: Diagnosis not present

## 2018-08-08 DIAGNOSIS — Z833 Family history of diabetes mellitus: Secondary | ICD-10-CM | POA: Diagnosis not present

## 2018-08-08 DIAGNOSIS — F909 Attention-deficit hyperactivity disorder, unspecified type: Secondary | ICD-10-CM | POA: Diagnosis present

## 2018-08-08 DIAGNOSIS — E785 Hyperlipidemia, unspecified: Secondary | ICD-10-CM | POA: Diagnosis present

## 2018-08-08 DIAGNOSIS — Z794 Long term (current) use of insulin: Secondary | ICD-10-CM | POA: Diagnosis not present

## 2018-08-08 DIAGNOSIS — M26609 Unspecified temporomandibular joint disorder, unspecified side: Secondary | ICD-10-CM | POA: Diagnosis not present

## 2018-08-08 DIAGNOSIS — Z79899 Other long term (current) drug therapy: Secondary | ICD-10-CM

## 2018-08-08 DIAGNOSIS — R4701 Aphasia: Secondary | ICD-10-CM | POA: Diagnosis present

## 2018-08-08 DIAGNOSIS — E1159 Type 2 diabetes mellitus with other circulatory complications: Secondary | ICD-10-CM | POA: Diagnosis not present

## 2018-08-08 DIAGNOSIS — I63112 Cerebral infarction due to embolism of left vertebral artery: Secondary | ICD-10-CM | POA: Diagnosis not present

## 2018-08-08 DIAGNOSIS — I63212 Cerebral infarction due to unspecified occlusion or stenosis of left vertebral arteries: Principal | ICD-10-CM | POA: Diagnosis present

## 2018-08-08 DIAGNOSIS — R42 Dizziness and giddiness: Secondary | ICD-10-CM

## 2018-08-08 DIAGNOSIS — E1165 Type 2 diabetes mellitus with hyperglycemia: Secondary | ICD-10-CM | POA: Diagnosis present

## 2018-08-08 DIAGNOSIS — E1122 Type 2 diabetes mellitus with diabetic chronic kidney disease: Secondary | ICD-10-CM | POA: Diagnosis present

## 2018-08-08 DIAGNOSIS — R29810 Facial weakness: Secondary | ICD-10-CM | POA: Diagnosis present

## 2018-08-08 DIAGNOSIS — H9191 Unspecified hearing loss, right ear: Secondary | ICD-10-CM | POA: Diagnosis present

## 2018-08-08 DIAGNOSIS — K3184 Gastroparesis: Secondary | ICD-10-CM | POA: Diagnosis present

## 2018-08-08 DIAGNOSIS — I10 Essential (primary) hypertension: Secondary | ICD-10-CM | POA: Diagnosis not present

## 2018-08-08 DIAGNOSIS — E119 Type 2 diabetes mellitus without complications: Secondary | ICD-10-CM | POA: Diagnosis not present

## 2018-08-08 DIAGNOSIS — N182 Chronic kidney disease, stage 2 (mild): Secondary | ICD-10-CM | POA: Diagnosis present

## 2018-08-08 DIAGNOSIS — R270 Ataxia, unspecified: Secondary | ICD-10-CM | POA: Diagnosis not present

## 2018-08-08 DIAGNOSIS — E11319 Type 2 diabetes mellitus with unspecified diabetic retinopathy without macular edema: Secondary | ICD-10-CM | POA: Diagnosis present

## 2018-08-08 LAB — CBC
HCT: 45.5 % (ref 39.0–52.0)
Hemoglobin: 14.9 g/dL (ref 13.0–17.0)
MCH: 28.2 pg (ref 26.0–34.0)
MCHC: 32.7 g/dL (ref 30.0–36.0)
MCV: 86 fL (ref 80.0–100.0)
NRBC: 0 % (ref 0.0–0.2)
Platelets: 231 10*3/uL (ref 150–400)
RBC: 5.29 MIL/uL (ref 4.22–5.81)
RDW: 12.8 % (ref 11.5–15.5)
WBC: 6.1 10*3/uL (ref 4.0–10.5)

## 2018-08-08 LAB — DIFFERENTIAL
Abs Immature Granulocytes: 0.01 10*3/uL (ref 0.00–0.07)
BASOS PCT: 1 %
Basophils Absolute: 0 10*3/uL (ref 0.0–0.1)
EOS PCT: 2 %
Eosinophils Absolute: 0.1 10*3/uL (ref 0.0–0.5)
Immature Granulocytes: 0 %
Lymphocytes Relative: 14 %
Lymphs Abs: 0.9 10*3/uL (ref 0.7–4.0)
Monocytes Absolute: 0.5 10*3/uL (ref 0.1–1.0)
Monocytes Relative: 8 %
Neutro Abs: 4.6 10*3/uL (ref 1.7–7.7)
Neutrophils Relative %: 75 %

## 2018-08-08 LAB — COMPREHENSIVE METABOLIC PANEL
ALT: 29 U/L (ref 0–44)
ANION GAP: 9 (ref 5–15)
AST: 23 U/L (ref 15–41)
Albumin: 4.1 g/dL (ref 3.5–5.0)
Alkaline Phosphatase: 68 U/L (ref 38–126)
BUN: 18 mg/dL (ref 8–23)
CO2: 21 mmol/L — ABNORMAL LOW (ref 22–32)
Calcium: 9.3 mg/dL (ref 8.9–10.3)
Chloride: 102 mmol/L (ref 98–111)
Creatinine, Ser: 1.59 mg/dL — ABNORMAL HIGH (ref 0.61–1.24)
GFR calc Af Amer: 51 mL/min — ABNORMAL LOW (ref >60)
GFR calc non Af Amer: 44 mL/min — ABNORMAL LOW (ref >60)
Glucose, Bld: 297 mg/dL — ABNORMAL HIGH (ref 70–99)
Potassium: 3.9 mmol/L (ref 3.5–5.1)
Sodium: 132 mmol/L — ABNORMAL LOW (ref 135–145)
Total Bilirubin: 0.8 mg/dL (ref 0.3–1.2)
Total Protein: 7.5 g/dL (ref 6.5–8.1)

## 2018-08-08 LAB — PROTIME-INR
INR: 1 (ref 0.8–1.2)
Prothrombin Time: 13.5 seconds (ref 11.4–15.2)

## 2018-08-08 LAB — CBG MONITORING, ED: Glucose-Capillary: 274 mg/dL — ABNORMAL HIGH (ref 70–99)

## 2018-08-08 LAB — GLUCOSE, CAPILLARY
Glucose-Capillary: 191 mg/dL — ABNORMAL HIGH (ref 70–99)
Glucose-Capillary: 363 mg/dL — ABNORMAL HIGH (ref 70–99)
Glucose-Capillary: 285 mg/dL — ABNORMAL HIGH (ref 70–99)

## 2018-08-08 LAB — I-STAT CREATININE, ED: Creatinine, Ser: 1.5 mg/dL — ABNORMAL HIGH (ref 0.61–1.24)

## 2018-08-08 LAB — APTT: aPTT: 31 seconds (ref 24–36)

## 2018-08-08 LAB — ECHOCARDIOGRAM COMPLETE
Height: 68 in
Weight: 3082.91 oz

## 2018-08-08 LAB — MRSA PCR SCREENING: MRSA BY PCR: NEGATIVE

## 2018-08-08 MED ORDER — ACETAMINOPHEN 650 MG RE SUPP: 650.0000 mg | RECTAL | Status: DC | PRN

## 2018-08-08 MED ORDER — SODIUM CHLORIDE 0.9 % IV SOLN
INTRAVENOUS | Status: DC
  Administered 2018-08-08 – 2018-08-09 (×3): via INTRAVENOUS

## 2018-08-08 MED ORDER — ACETAMINOPHEN 325 MG PO TABS: 650.0000 mg | ORAL_TABLET | ORAL | Status: DC | PRN

## 2018-08-08 MED ORDER — IOHEXOL 350 MG/ML SOLN
50.0000 mL | Freq: Once | INTRAVENOUS | Status: AC | PRN
  Administered 2018-08-08: 50 mL via INTRAVENOUS

## 2018-08-08 MED ORDER — SENNOSIDES-DOCUSATE SODIUM 8.6-50 MG PO TABS: 1.0000 | ORAL_TABLET | Freq: Every evening | ORAL | Status: DC | PRN

## 2018-08-08 MED ORDER — MECLIZINE HCL 25 MG PO TABS
25.0000 mg | ORAL_TABLET | Freq: Once | ORAL | Status: AC
  Administered 2018-08-08: 25 mg via ORAL
  Filled 2018-08-08: qty 1

## 2018-08-08 MED ORDER — INSULIN ASPART 100 UNIT/ML ~~LOC~~ SOLN
0.0000 [IU] | Freq: Three times a day (TID) | SUBCUTANEOUS | Status: DC
  Administered 2018-08-08: 9 [IU] via SUBCUTANEOUS
  Administered 2018-08-09: 5 [IU] via SUBCUTANEOUS
  Administered 2018-08-09 (×2): 2 [IU] via SUBCUTANEOUS

## 2018-08-08 MED ORDER — ALTEPLASE (STROKE) FULL DOSE INFUSION
0.9000 mg/kg | Freq: Once | INTRAVENOUS | Status: AC
  Administered 2018-08-08: 78.7 mg via INTRAVENOUS
  Filled 2018-08-08: qty 100

## 2018-08-08 MED ORDER — ACETAMINOPHEN 160 MG/5ML PO SOLN: 650.0000 mg | ORAL | Status: DC | PRN

## 2018-08-08 MED ORDER — LABETALOL HCL 5 MG/ML IV SOLN: 20.0000 mg | Freq: Once | INTRAVENOUS | Status: DC

## 2018-08-08 MED ORDER — STROKE: EARLY STAGES OF RECOVERY BOOK: Freq: Once | Status: DC

## 2018-08-08 MED ORDER — PANTOPRAZOLE SODIUM 40 MG PO TBEC
40.0000 mg | DELAYED_RELEASE_TABLET | Freq: Every day | ORAL | Status: DC
  Administered 2018-08-09 – 2018-08-11 (×3): 40 mg via ORAL
  Filled 2018-08-08 (×3): qty 1

## 2018-08-08 MED ORDER — PANTOPRAZOLE SODIUM 40 MG IV SOLR
40.0000 mg | Freq: Every day | INTRAVENOUS | Status: DC
  Administered 2018-08-08: 40 mg via INTRAVENOUS
  Filled 2018-08-08: qty 40

## 2018-08-08 MED ORDER — CLEVIDIPINE BUTYRATE 0.5 MG/ML IV EMUL: 0.0000 mg/h | INTRAVENOUS | Status: DC

## 2018-08-08 NOTE — Progress Notes (Signed)
  Echocardiogram 2D Echocardiogram has been performed.  Delcie Roch 08/08/2018, 12:59 PM

## 2018-08-08 NOTE — ED Provider Notes (Signed)
EKG: Normal sinus rhythm, ventricular rate 69.  PR 166, QRS 96, QTc 436.  Nonspecific ST changes in anterolateral leads, but no signs of apparent acute ischemia.  No significant change from February 2019   Shaune Pollack, MD 08/08/18 [phone]

## 2018-08-08 NOTE — Telephone Encounter (Signed)
Noted. And pt has gone to the ED.

## 2018-08-08 NOTE — Telephone Encounter (Signed)
Pt c/o severe dizziness, "like Im going to faint." Pt stated he is clammy, chills and right facial droop. Pt c/o feeling very hot. Pt's wife took his BP 110/59 117/69 HR 68 CBG 167. Pt is awake but not talking, is able to name person, place, and month and day. No slurred speech.  Advised wife to call 911 now. Wife verbalized understanding.   Reason for Disposition . [1] Weakness (i.e., paralysis, loss of muscle strength) of the face, arm or leg on one side of the body AND [2] sudden onset AND [3] present now  Answer Assessment - Initial Assessment Questions 1. DESCRIPTION: "Describe your dizziness."     Dizzy  2. LIGHTHEADED: "Do you feel lightheaded?" (e.g., somewhat faint, woozy, weak upon standing)     Yes real hot  3. VERTIGO: "Do you feel like either you or the room is spinning or tilting?" (i.e. vertigo)     yes 4. SEVERITY: "How bad is it?"  "Do you feel like you are going to faint?" "Can you stand and walk?"   - MILD - walking normally   - MODERATE - interferes with normal activities (e.g., work, school)    - SEVERE - unable to stand, requires support to walk, feels like passing out now.      Feels hes going to faint- severe 5. ONSET:  "When did the dizziness begin?"   Last night 6. AGGRAVATING FACTORS: "Does anything make it worse?" (e.g., standing, change in head position)     Standing, sitting 7. HEART RATE: "Can you tell me your heart rate?" "How many beats in 15 seconds?"  (Note: not all patients can do this)       68 8. CAUSE: "What do you think is causing the dizziness?"     Pt doesn't know 9. RECURRENT SYMPTOM: "Have you had dizziness before?" If so, ask: "When was the last time?" "What happened that time?"     no 10. OTHER SYMPTOMS: "Do you have any other symptoms?" (e.g., fever, chest pain, vomiting, diarrhea, bleeding)      Clammy, chills, right facial droop 11. PREGNANCY: "Is there any chance you are pregnant?" "When was your last menstrual period?"        n/a  Protocols used: DIZZINESS [facility]

## 2018-08-08 NOTE — Code Documentation (Signed)
68 yo male coming from home with complaints of sudden onset of dizziness around 0730. Pt had an episode of dizziness yesterday that lasted a few hours. Pt went to bed and this morning when he woke up around 0645 he was at his baseline. Pt took his dog at and approximately around 0730 pt was noted to become dizzy and having trouble walking. Pt called PCP and was told to call EMS. EMS brought patient in and during transport noted that patient had trouble speaking and reported right facial numbness. Upon arrival to ED, he did not have trouble speaking but was still reporting some dizziness and trouble walking. PA activated a Code Stroke. Stroke Team met patient in CT. Initial NIHSS 4 due to bilateral limb ataxia and bilateral leg weakness. CT Head showed no hemorrhage. Risk and Benefits of tPA discussed with patient. Verbalized understanding and tPA given at 0927. 7.9 mg bolus over one minute given with 70.8 mg over 60 minutes. CTA completed and showed Left Vertebral occlusion. PLaced on the cardiac monitor and patient to be admitted. Handoff given to Junius Argyle, RN

## 2018-08-08 NOTE — ED Provider Notes (Signed)
Medical screening examination/treatment/procedure(s) were conducted as a shared visit with non-physician practitioner(s) and myself.  I personally evaluated the patient during the encounter. Briefly, the patient is a 68 yo M here with ataxia, facial numbness. Concern for unstable PICA TIAs versus CVA. Code stroke activated on arrival, Dr. Laurence Slate @ bedside. CT head neg. Will start tPA admit to ICU. EKG shows normal sinus rhythm, ventricular rate 82.  PR 127.  QRS 78.  QTc 464.  Occasional PACs noted with nonspecific T wave changes, but no evidence of acute ischemia or infarct.Shaune Pollack, MD 08/08/18 [phone]

## 2018-08-08 NOTE — Progress Notes (Signed)
Pt had episode of dizziness then began to cry, grab his jaw, and he was unable to speak. He was able to follow commands bilaterally but he could not speak. This lasted about 3 minutes, then he went back to his baseline. Dr Aroor notified. Will remain on strict bedrest. (Pt had demanded to walk to the bathroom preceeding event). Pt and family re-educated on importance of compliance with bedrest orders. Will place pt back on q 15 min exams for now.

## 2018-08-08 NOTE — Progress Notes (Signed)
Pharmacist Code Stroke Response  Notified to mix tPA at 0921 by Dr. Laurence Slate Delivered tPA to RN at [phone]  tPA dose = 7.9mg  bolus over 1 minute followed by 70.8mg  for a total dose of 78.7mg  over 1 hour  Issues/delays encountered (if applicable): Pharmacist started mixing TPA but had to wait ~40min for weight to become available prior to drawing up dose  Jeff [name] 08/08/18 9:29 AM

## 2018-08-08 NOTE — H&P (Addendum)
NEURO HOSPITALIST  H&P    Chief Complaint: dizziness, trouble walking, right side facial bumbness  History obtained from:  Patient     HPI:                                                                                                                                         Jeff [name] is an 68 y.o. male  With PMH of HTN, DM, HLD who presented to [facility] ED via EMS with c/o of dizziness and right facial numbness as well as some problems walking. Code stroke initiated in ED.    Per patient last night at the [facility] lodge he had a sudden onset of dizziness where he felt the room was spinning. His symptoms completely resolved and when he woke up this morning he was symptom free. The symptoms returned about 0745 this morning about 15 minutes after he got up to take care of his animals. His wife reported some facial droop and word finding difficulty.  She called EMS.  Patient denies any anticoagulation, or bleeding. He is being treated for ulcer, but denies in dark colored stools or rectal bleeding. No prior stroke history noted. Denies any CP, SOB or a. Fib, Ha, vision changes.    ED course:  CTH: no hemorrhage CTA: left vertebral occlusion CBG: 274 BP: 148/105  Wt: 87.4Kg   Date last known well: 08/08/2018 Time last known well: 0745 tPA Given: Yes; started @ 0927 Modified Rankin: Rankin Score=0 NIHSS:4   Past Medical History:  Diagnosis Date  . ADHD (attention deficit hyperactivity disorder)   . Diabetes mellitus   . Hyperlipidemia   . Hypertension     Past Surgical History:  Procedure Laterality Date  . NECK SURGERY    . SPINE SURGERY     Cervical spine x 2; [employer]; [employer].    Family History  Problem Relation Age of Onset  . Heart disease Mother        CHF  . Diabetes Mother   . Heart disease Father   . Cancer Brother      Social History:  reports that he has never smoked. He has never used smokeless tobacco. He reports that  he does not drink alcohol or use drugs.  Allergies:  Allergies  Allergen Reactions  . Flexeril [Cyclobenzaprine Hcl] Other (See Comments)    Causes him to pass out and lowers his BP  . Lidocaine Swelling    throat  . Novocain [Procaine Hcl] Swelling    Medications:  Current Facility-Administered Medications  Medication Dose Route Frequency Provider Last Rate Last Dose  . alteplase (ACTIVASE) 1 mg/mL infusion 78.7 mg  0.9 mg/kg Intravenous Once Aroor, Dara Lords, MD       Current Outpatient Medications  Medication Sig Dispense Refill  . aspirin EC 81 MG tablet Take 1 tablet (81 mg total) by mouth daily. 365 tablet 0  . glucose blood (ONETOUCH VERIO) test strip 1 each 2 (two) times daily by Other route. And lancets 2/day 100 each 12  . insulin NPH Human (NOVOLIN N) 100 UNIT/ML injection Inject 1.1 mLs (110 Units total) every morning into the skin. And syringes 2/day 110 mL 11  . Insulin Syringe-Needle U-100 (B-D INS SYR ULTRAFINE 1CC/31G) 31G X 5/16" 1 ML MISC Used to inject insulin twice daily. 90 each 2  . lisinopril (PRINIVIL,ZESTRIL) 10 MG tablet TAKE 1 TABLET(10 MG) BY MOUTH DAILY 90 tablet 1  . metFORMIN (GLUCOPHAGE) 1000 MG tablet Take 1 tablet (1,000 mg total) by mouth 2 (two) times daily with a meal. 180 tablet 1  . metoCLOPramide (REGLAN) 5 MG tablet Take 1 tablet (5 mg total) by mouth 3 (three) times daily before meals. 60 tablet 0  . omeprazole (PRILOSEC) 40 MG capsule Take 1 capsule (40 mg total) by mouth daily. 30 minutes before breakfast 30 capsule 3  . rosuvastatin (CRESTOR) 20 MG tablet Take 1 tablet (20 mg total) by mouth daily. 90 tablet 3  . sucralfate (CARAFATE) 1 g tablet Take 1 tablet (1 g total) by mouth 4 (four) times daily -  with meals and at bedtime. 60 tablet 0     ROS:                                                                                                                                        ROS was performed and is negative except as noted in HPI    General Examination:                                                                                                      Height 5\' 8"  (1.727 m), weight 85.3 kg, SpO2 99 %.  HEENT-  Normocephalic, no lesions, without obvious abnormality.  Normal external eye and conjunctiva. Cardiovascular- S1-S2 audible, pulses palpable throughout  Lungs-no rhonchi or wheezing noted, no excessive working breathing.  Saturations within normal limits on RA Extremities- Warm, dry and intact Musculoskeletal-no joint tenderness, deformity or swelling Skin-warm and dry, no hyperpigmentation, vitiligo, or  suspicious lesions  Neurological Examination Mental Status: Alert, oriented, thought content appropriate.  Speech fluent without evidence of aphasia.  Able to follow  commands without difficulty. Cranial Nerves: II: ; Visual fields grossly normal,  III,IV, VI: ptosis not present, extra-ocular motions intact bilaterally, pupils equal, round, reactive to light and accommodation V,VII: smile symmetric, facial light touch sensation with right facial numbness.  VIII: hearing normal bilaterally IX,X: uvula rises midline XI: bilateral shoulder shrug XII: midline tongue extension Motor: Right : Upper extremity   5/5  Left:     Upper extremity   5/5  Lower extremity   4/5   Lower extremity   4/5 Drift noted in bilateral legs. Tone and bulk:normal tone throughout; no atrophy noted Sensory:light touch intact throughout, bilaterally Cerebellar: normal finger-to-nose, and ataxic HTS bilaterally Gait: deferred; but reported to be unstable gait.   Lab Results: Basic Metabolic Panel: Recent Labs  Lab 08/08/18 0908  CREATININE 1.50*    CBC: Recent Labs  Lab 08/08/18 0857  WBC 6.1  NEUTROABS 4.6  HGB 14.9  HCT 45.5  MCV 86.0  PLT 231    CBG: Recent Labs  Lab 08/08/18 0843   GLUCAP 274*    Imaging: Ct Angio Head W Or Wo Contrast  Result Date: 08/08/2018 CLINICAL DATA:  68 year old male with ataxia, vertigo, suspicious for posterior circulation ischemia/thrombosis. EXAM: CT ANGIOGRAPHY HEAD AND NECK TECHNIQUE: Multidetector CT imaging of the head and neck was performed using the standard protocol during bolus administration of intravenous contrast. Multiplanar CT image reconstructions and MIPs were obtained to evaluate the vascular anatomy. Carotid stenosis measurements (when applicable) are obtained utilizing NASCET criteria, using the distal internal carotid diameter as the denominator. CONTRAST:  6mL OMNIPAQUE IOHEXOL 350 MG/ML SOLN COMPARISON:  Head CT without contrast 0918 hours today. FINDINGS: CTA NECK Skeleton: Prior cervical ACDF. Absent arthrodesis in the lowest fused segment and anterior extension of the right C7 cortical screw (series 6, image 51). No acute osseous abnormality identified. Upper chest: Calcified coronary artery atherosclerosis, otherwise negative. Other neck: Negative; no neck mass or lymphadenopathy. Aortic arch: 3 vessel arch configuration with minimal atherosclerosis. Right carotid system: Negative. Left carotid system: Minimal soft plaque at the left ICA origin without stenosis. Vertebral arteries: No proximal right subclavian or right vertebral artery origin stenosis. Minimal calcified plaque near the right vertebral origin. The right vertebral appears somewhat dominant and is patent to the skull base without stenosis. Mild soft plaque in the proximal left subclavian artery without stenosis. There is up to moderate stenosis at the left vertebral artery origin related to soft plaque on series 9, image 162. The left vertebral artery is patent in the neck but somewhat diminutive compared to that on the right. The vessel remains patent to the C1 level, see below. CTA HEAD Posterior circulation: The left vertebral artery is occluded in the posterior  fossa as it crosses the dura. Series 3, image 5 of the plain head CT earlier today demonstrated fairly codominant appearance of the vertebrals. The left PICA arises early, similar to that on the right, and is reconstituted. There is reconstituted flow at a fenestrated vertebrobasilar junction. The right vertebral artery V4 segment remains normal. Normal right PICA origin. The basilar artery remains normal. SCA and PCA origins are within normal limits. Posterior communicating arteries are diminutive or absent. Bilateral PCA branches are patent. The right PCA is mildly irregular. Anterior circulation: Both ICA siphons are patent. On the left moderate calcified plaque results in mild stenosis of the  supraclinoid segment. On the right heavy calcified plaque results in moderate to severe cavernous segment and proximal supraclinoid stenosis. The right ophthalmic artery origin is normal. Patent carotid termini. Normal MCA and ACA origins. Anterior communicating artery and bilateral ACA branches are patent. There is mild distal ACA irregularity. Left MCA M1 segment and bifurcation are patent although there is moderate stenosis of the inferior left M2 branch origin seen on series 10, image 26. Other left MCA branches are within normal limits. Right MCA M1 segment and trifurcation are patent without stenosis. There is mild right MCA branch irregularity. Venous sinuses: Patent. Anatomic variants: None. Review of the MIP images confirms the above findings IMPRESSION: 1. Positive for Left Vertebral Artery V4 occlusion. The Right vertebral artery and basilar remain normal. This was reviewed in person with Dr. Arther Dames on 08/08/2018 at 0950 hours. 2. Superimposed moderate left vertebral artery origin stenosis appears related to soft plaque. 3. Heavily calcified ICA siphons with moderate Left and moderate to severe Right siphon stenosis. No carotid stenosis in the neck. 4. Moderate Left MCA M2 origin stenosis. 5. Lower cervical  ACDF with pseudoarthrosis and hardware loosening at C6-C7. Electronically Signed   By: Odessa Fleming M.D.   On: 08/08/2018 10:01   Ct Angio Neck W Or Wo Contrast  Result Date: 08/08/2018 CLINICAL DATA:  68 year old male with ataxia, vertigo, suspicious for posterior circulation ischemia/thrombosis. EXAM: CT ANGIOGRAPHY HEAD AND NECK TECHNIQUE: Multidetector CT imaging of the head and neck was performed using the standard protocol during bolus administration of intravenous contrast. Multiplanar CT image reconstructions and MIPs were obtained to evaluate the vascular anatomy. Carotid stenosis measurements (when applicable) are obtained utilizing NASCET criteria, using the distal internal carotid diameter as the denominator. CONTRAST:  50mL OMNIPAQUE IOHEXOL 350 MG/ML SOLN COMPARISON:  Head CT without contrast 0918 hours today. FINDINGS: CTA NECK Skeleton: Prior cervical ACDF. Absent arthrodesis in the lowest fused segment and anterior extension of the right C7 cortical screw (series 6, image 51). No acute osseous abnormality identified. Upper chest: Calcified coronary artery atherosclerosis, otherwise negative. Other neck: Negative; no neck mass or lymphadenopathy. Aortic arch: 3 vessel arch configuration with minimal atherosclerosis. Right carotid system: Negative. Left carotid system: Minimal soft plaque at the left ICA origin without stenosis. Vertebral arteries: No proximal right subclavian or right vertebral artery origin stenosis. Minimal calcified plaque near the right vertebral origin. The right vertebral appears somewhat dominant and is patent to the skull base without stenosis. Mild soft plaque in the proximal left subclavian artery without stenosis. There is up to moderate stenosis at the left vertebral artery origin related to soft plaque on series 9, image 162. The left vertebral artery is patent in the neck but somewhat diminutive compared to that on the right. The vessel remains patent to the C1 level, see  below. CTA HEAD Posterior circulation: The left vertebral artery is occluded in the posterior fossa as it crosses the dura. Series 3, image 5 of the plain head CT earlier today demonstrated fairly codominant appearance of the vertebrals. The left PICA arises early, similar to that on the right, and is reconstituted. There is reconstituted flow at a fenestrated vertebrobasilar junction. The right vertebral artery V4 segment remains normal. Normal right PICA origin. The basilar artery remains normal. SCA and PCA origins are within normal limits. Posterior communicating arteries are diminutive or absent. Bilateral PCA branches are patent. The right PCA is mildly irregular. Anterior circulation: Both ICA siphons are patent. On the left moderate calcified  plaque results in mild stenosis of the supraclinoid segment. On the right heavy calcified plaque results in moderate to severe cavernous segment and proximal supraclinoid stenosis. The right ophthalmic artery origin is normal. Patent carotid termini. Normal MCA and ACA origins. Anterior communicating artery and bilateral ACA branches are patent. There is mild distal ACA irregularity. Left MCA M1 segment and bifurcation are patent although there is moderate stenosis of the inferior left M2 branch origin seen on series 10, image 26. Other left MCA branches are within normal limits. Right MCA M1 segment and trifurcation are patent without stenosis. There is mild right MCA branch irregularity. Venous sinuses: Patent. Anatomic variants: None. Review of the MIP images confirms the above findings IMPRESSION: 1. Positive for Left Vertebral Artery V4 occlusion. The Right vertebral artery and basilar remain normal. This was reviewed in person with Dr. Arther Dames on 08/08/2018 at 0950 hours. 2. Superimposed moderate left vertebral artery origin stenosis appears related to soft plaque. 3. Heavily calcified ICA siphons with moderate Left and moderate to severe Right siphon  stenosis. No carotid stenosis in the neck. 4. Moderate Left MCA M2 origin stenosis. 5. Lower cervical ACDF with pseudoarthrosis and hardware loosening at C6-C7. Electronically Signed   By: Odessa Fleming M.D.   On: 08/08/2018 10:01   Ct Head Code Stroke Wo Contrast`  Result Date: 08/08/2018 CLINICAL DATA:  Code stroke.  Persistent vertigo EXAM: CT HEAD WITHOUT CONTRAST TECHNIQUE: Contiguous axial images were obtained from the base of the skull through the vertex without intravenous contrast. COMPARISON:  06/02/2015 FINDINGS: Brain: No evidence of acute infarction, hemorrhage, hydrocephalus, extra-axial collection or mass lesion/mass effect. Moderate central predominant atrophy, progressed. Moderate chronic small vessel ischemic low-density in the cerebral white matter. Vascular: Atherosclerotic calcification.  No hyperdense vessel Skull: Negative Sinuses/Orbits: Negative Other: These results were communicated to Dr. Laurence Slate at 9:25 amon 3/6/2020by text page via the [facility] messaging system. ASPECTS [facility] Stroke Program Early CT Score) Not scored with this history IMPRESSION: 1. No acute finding 2. Atrophy and chronic small vessel ischemia that has progressed from 2016. Electronically Signed   By: Marnee Spring M.D.   On: 08/08/2018 09:26    Valentina Lucks, MSN, NP-C [facility] [phone]  08/08/2018, 9:27 AM   Attending physician note to follow with Assessment and plan .   Assessment: 68 y.o. male  With PMH of HTN, DM, HLD who presented to [facility] ED via EMS with c/o of dizziness and right facial numbness as well as some problems walking. Code stroke initiated in ED. CTH: no hemorrhage. TPA given. CTA: left vertebral occlusion. Stroke Risk Factors - diabetes mellitus, hyperlipidemia and hypertension   Plan  -- BP goal  180/170mmHg,  --MRI Brain  -- repeat CT head in 24 hrs post tPA ( cancel if MRI done with 4 hours)  --Echocardiogram --  ASA  24 hours post TPA -- High intensity Statin if  LDL > 70 -- HgbA1c, fasting lipid panel -- PT consult, OT consult, Speech consult --Telemetry monitoring --Frequent neuro checks --Stroke swallow screen --NO anticoagulation, SCD's only  CNS -Close neuro monitoring  RESP No acute issues -montior  CV Labetalol PRN to maintain BP control  - goal SBP < 180/105 -Titrate oral agents   GI/GU -Gentle hydration   HEME -Monitor -transfuse for hgb < 7  ENDO Type 2 diabetes mellitus w/o complications . -goal HgbA1c < 7 - SS insulin ordered   ID -CXR -NPO -Monitor  Prophylaxis DVT: SCD's only  GI: doc/ senna  Dispo: TBD  Diet: NPO until cleared by speech or bedside swallow eval  Code Status: Full Code      --please page stroke NP  Or  PA  Or MD from 8am -4 pm  as this patient from this time will be  followed by the stroke.   You can look them up on [email or web address]  Password [email or web address]  NEUROHOSPITALIST ADDENDUM Performed a face to face diagnostic evaluation.   I have reviewed the contents of history and physical exam as documented by PA/ARNP/Resident and agree with above documentation.  I have discussed and formulated the above plan as documented. Edits to the note have been made as needed.  67 y with HTN, DM, HLD with sudden onset dizziness, trouble walking as well as right facial numbness, difficulty getting words and unable to open his mouth that lasted for a few minutes.  His symptoms improved however he continues to have dizziness on arrival to [facility].  He was not stroke alerted by EMS and they felt he had a" locked jaw".  EDP called me immediately and be stroke alerted the patient. Last known normal was 7:30 AM.  Patient had was awake at least 15 minutes prior to this. On assessment he scored NIH stroke scale of 2 for ataxia (later scored 4 for drift in both legs), unable to stand due to imbalance.  Although his NIH stroke scale was low, since then his symptoms were disabling we discussed TPA. He was informed risk  of 2 to 3% risk of hemorrhage in the brain versus a 30% chance of improved recovery.  Patient has no recent surgeries, being treated for presumed ulcer but no active bleeding. Patient consented to IV TPA and admitted to neuro ICU. CTA shows left vertebral occlusion.  Remaining stroke work-up as noted above MRI brain, echocardiogram, lipid profile, A1c.  Repeat CT head in 24 hours.    This patient is neurologically critically ill due to stroke status post IV TPA he is at risk for significant risk of neurological worsening from cerebral edema,  death from brain herniation, heart failure, hemorrhagic conversion, infection, respiratory failure and seizure. This patient's care requires constant monitoring of vital signs, hemodynamics, respiratory and cardiac monitoring, review of multiple databases, neurological assessment, discussion with family, other specialists and medical decision making of high complexity.  I spent 60  minutes of neurocritical time in the care of this patient.     Georgiana Spinner Aroor MD [facility] [phone]   If 7pm to 7am, please call on call as listed on AMION.

## 2018-08-08 NOTE — ED Triage Notes (Signed)
Pt arrives via EMS from home with reports of dizziness starting at 6:45. Pt had R sided facial numbness that turned into jaw pain and pt states he couldn't open his mouth. Pt recently started on Reglan, sucralfate and Prilosec.

## 2018-08-08 NOTE — ED Provider Notes (Addendum)
[facility] EMERGENCY DEPARTMENT Provider Note   CSN: [id number] Arrival date & time: 08/08/18  0825    History   Chief Complaint Chief Complaint  Patient presents with  . Dizziness    HPI Jeff [name] is a 68 y.o. male who presents emergency department chief complaint of dizziness.  He has a past medical history of diabetes, last A1c was 10, hyperlipidemia and hypertension which are well controlled with medications.  Last night the patient was at the [facility] when he had sudden onset of room spinning dizziness and ataxia lasting several hours.  He was able to go to sleep and when he woke up this morning was deficit free.  He got up to take care of his animals and around 7:30 AM had sudden return of ataxia and room spinning dizziness.  About 15 minutes into the process he developed numbness on the right side of his face.  His wife who is at bedside also reports facial droop, and word finding difficulty.  She called 911 and during the patient's ride over he had resolution of the facial numbness and droop and states that he is feeling "100% better and ready to go home."  Nurses by the bedside report that he seemed to be still ataxic when they were transitioning him from the ED MS stretcher to the bed.  Patient denies racing or skipping in his heart.  He does not take a blood thinner.  He has no previous history of A. fib.  He denies headache, changes in vision unilateral weakness.     HPI  Past Medical History:  Diagnosis Date  . ADHD (attention deficit hyperactivity disorder)   . Diabetes mellitus   . Hyperlipidemia   . Hypertension     Patient Active Problem List   Diagnosis Date Noted  . Stroke (HCC) 08/08/2018  . Diabetes (HCC) 07/25/2015  . Neck injury 06/22/2015  . Hearing loss in right ear 10/14/2011  . Agent orange exposure 10/14/2011  . ADD (attention deficit disorder) 10/14/2011  . Hyperlipidemia 10/14/2011  . BMI 28.0-28.9,adult 10/14/2011    Past  Surgical History:  Procedure Laterality Date  . NECK SURGERY    . SPINE SURGERY     Cervical spine x 2; [employer]; [employer].        Home Medications    Prior to Admission medications   Medication Sig Start Date End Date Taking? Authorizing Provider  aspirin EC 81 MG tablet Take 1 tablet (81 mg total) by mouth daily. 12/30/17  Yes Ofilia Neas, PA-C  calcium carbonate (TUMS EX) 750 MG chewable tablet Chew 2 tablets by mouth as needed for heartburn.   Yes [provider]  insulin NPH Human (NOVOLIN N) 100 UNIT/ML injection Inject 1.1 mLs (110 Units total) every morning into the skin. And syringes 2/day 04/11/17  Yes Romero Belling, MD  lisinopril (PRINIVIL,ZESTRIL) 10 MG tablet TAKE 1 TABLET(10 MG) BY MOUTH DAILY 12/30/17  Yes Ofilia Neas, PA-C  metFORMIN (GLUCOPHAGE) 1000 MG tablet Take 1 tablet (1,000 mg total) by mouth 2 (two) times daily with a meal. 12/30/17  Yes Ofilia Neas, PA-C  metoCLOPramide (REGLAN) 5 MG tablet Take 1 tablet (5 mg total) by mouth 3 (three) times daily before meals. 07/29/18  Yes Sherren Mocha, MD  omeprazole (PRILOSEC) 40 MG capsule Take 1 capsule (40 mg total) by mouth daily. 30 minutes before breakfast 07/29/18  Yes Sherren Mocha, MD  rosuvastatin (CRESTOR) 20 MG tablet Take 1  tablet (20 mg total) by mouth daily. 01/07/18  Yes Ofilia Neaslark, Michael L, PA-C  Semaglutide (OZEMPIC, 1 MG/DOSE, [place]) Inject 1 mg into the skin once a week. On Sunday   Yes [provider]  sucralfate (CARAFATE) 1 g tablet Take 1 tablet (1 g total) by mouth 4 (four) times daily -  with meals and at bedtime. 07/29/18  Yes Sherren MochaShaw, Eva N, MD  glucose blood (ONETOUCH VERIO) test strip 1 each 2 (two) times daily by Other route. And lancets 2/day 04/11/17   Romero BellingEllison, Sean, MD  Insulin Syringe-Needle U-100 (B-D INS SYR ULTRAFINE 1CC/31G) 31G X 5/16" 1 ML MISC Used to inject insulin twice daily. 08/16/17   Romero BellingEllison, Sean, MD    Family History Family History  Problem Relation Age of Onset    . Heart disease Mother        CHF  . Diabetes Mother   . Heart disease Father   . Cancer Brother     Social History Social History   Tobacco Use  . Smoking status: Never Smoker  . Smokeless tobacco: Never Used  Substance Use Topics  . Alcohol use: No    Alcohol/week: 0.0 standard drinks  . Drug use: No     Allergies   Flexeril [cyclobenzaprine hcl]; Lidocaine; and Novocain [procaine hcl]   Review of Systems Review of Systems  Ten systems reviewed and are negative for acute change, except as noted in the HPI.   Physical Exam Updated Vital Signs BP 127/77   Pulse 75   Temp 98.4 F (36.9 C)   Resp 11   Ht 5\' 8"  (1.727 m)   Wt 87.4 kg   SpO2 98%   BMI 29.30 kg/m   Physical Exam  Physical Exam  Nursing note and vitals reviewed. Constitutional: He appears well-developed and well-nourished. No distress.  HENT:  Head: Normocephalic and atraumatic.  Eyes: Conjunctivae normal are normal. No scleral icterus.  Neck: Normal range of motion. Neck supple.  Cardiovascular: Normal rate, regular rhythm and normal heart sounds.   Pulmonary/Chest: Effort normal and breath sounds normal. No respiratory distress.  Abdominal: Soft. There is no tenderness.  Musculoskeletal: He exhibits no edema.  Neurological: Speech is clear and goal oriented, follows commands Major Cranial nerves without deficit, no facial droop Normal strength in upper and lower extremities bilaterally including dorsiflexion and plantar flexion, strong and equal grip strength Sensation normal to light and sharp touch Abnormal F-N on the left No pronator drift Ataxic with standing at bedside, unable to ambulate   ED Treatments / Results  Labs (all labs ordered are listed, but only abnormal results are displayed) Labs Reviewed  COMPREHENSIVE METABOLIC PANEL - Abnormal; Notable for the following components:      Result Value   Sodium 132 (*)    CO2 21 (*)    Glucose, Bld 297 (*)    Creatinine, Ser  1.59 (*)    GFR calc non Af Amer 44 (*)    GFR calc Af Amer 51 (*)    All other components within normal limits  I-STAT CREATININE, ED - Abnormal; Notable for the following components:   Creatinine, Ser 1.50 (*)    All other components within normal limits  CBG MONITORING, ED - Abnormal; Notable for the following components:   Glucose-Capillary 274 (*)    All other components within normal limits  PROTIME-INR  APTT  CBC  DIFFERENTIAL  HIV ANTIBODY (ROUTINE TESTING W REFLEX)    EKG None  ED ECG REPORT  Date: 08/08/2018  Rate: 69  Rhythm: normal sinus rhythm  QRS Axis: normal  Intervals: normal  ST/T Wave abnormalities: nonspecific ST changes  Conduction Disutrbances:none  Narrative Interpretation:   Old EKG Reviewed: unchanged  I have personally reviewed the EKG tracing and agree with the computerized printout as noted.   Radiology Ct Angio Head W Or Wo Contrast  Result Date: 08/08/2018 CLINICAL DATA:  68 year old male with ataxia, vertigo, suspicious for posterior circulation ischemia/thrombosis. EXAM: CT ANGIOGRAPHY HEAD AND NECK TECHNIQUE: Multidetector CT imaging of the head and neck was performed using the standard protocol during bolus administration of intravenous contrast. Multiplanar CT image reconstructions and MIPs were obtained to evaluate the vascular anatomy. Carotid stenosis measurements (when applicable) are obtained utilizing NASCET criteria, using the distal internal carotid diameter as the denominator. CONTRAST:  50mL OMNIPAQUE IOHEXOL 350 MG/ML SOLN COMPARISON:  Head CT without contrast 0918 hours today. FINDINGS: CTA NECK Skeleton: Prior cervical ACDF. Absent arthrodesis in the lowest fused segment and anterior extension of the right C7 cortical screw (series 6, image 51). No acute osseous abnormality identified. Upper chest: Calcified coronary artery atherosclerosis, otherwise negative. Other neck: Negative; no neck mass or lymphadenopathy. Aortic arch: 3  vessel arch configuration with minimal atherosclerosis. Right carotid system: Negative. Left carotid system: Minimal soft plaque at the left ICA origin without stenosis. Vertebral arteries: No proximal right subclavian or right vertebral artery origin stenosis. Minimal calcified plaque near the right vertebral origin. The right vertebral appears somewhat dominant and is patent to the skull base without stenosis. Mild soft plaque in the proximal left subclavian artery without stenosis. There is up to moderate stenosis at the left vertebral artery origin related to soft plaque on series 9, image 162. The left vertebral artery is patent in the neck but somewhat diminutive compared to that on the right. The vessel remains patent to the C1 level, see below. CTA HEAD Posterior circulation: The left vertebral artery is occluded in the posterior fossa as it crosses the dura. Series 3, image 5 of the plain head CT earlier today demonstrated fairly codominant appearance of the vertebrals. The left PICA arises early, similar to that on the right, and is reconstituted. There is reconstituted flow at a fenestrated vertebrobasilar junction. The right vertebral artery V4 segment remains normal. Normal right PICA origin. The basilar artery remains normal. SCA and PCA origins are within normal limits. Posterior communicating arteries are diminutive or absent. Bilateral PCA branches are patent. The right PCA is mildly irregular. Anterior circulation: Both ICA siphons are patent. On the left moderate calcified plaque results in mild stenosis of the supraclinoid segment. On the right heavy calcified plaque results in moderate to severe cavernous segment and proximal supraclinoid stenosis. The right ophthalmic artery origin is normal. Patent carotid termini. Normal MCA and ACA origins. Anterior communicating artery and bilateral ACA branches are patent. There is mild distal ACA irregularity. Left MCA M1 segment and bifurcation are  patent although there is moderate stenosis of the inferior left M2 branch origin seen on series 10, image 26. Other left MCA branches are within normal limits. Right MCA M1 segment and trifurcation are patent without stenosis. There is mild right MCA branch irregularity. Venous sinuses: Patent. Anatomic variants: None. Review of the MIP images confirms the above findings IMPRESSION: 1. Positive for Left Vertebral Artery V4 occlusion. The Right vertebral artery and basilar remain normal. This was reviewed in person with Dr. Arther Dames on 08/08/2018 at 0950 hours. 2. Superimposed moderate left vertebral artery  origin stenosis appears related to soft plaque. 3. Heavily calcified ICA siphons with moderate Left and moderate to severe Right siphon stenosis. No carotid stenosis in the neck. 4. Moderate Left MCA M2 origin stenosis. 5. Lower cervical ACDF with pseudoarthrosis and hardware loosening at C6-C7. Electronically Signed   By: Odessa Fleming M.D.   On: 08/08/2018 10:01   Ct Angio Neck W Or Wo Contrast  Result Date: 08/08/2018 CLINICAL DATA:  68 year old male with ataxia, vertigo, suspicious for posterior circulation ischemia/thrombosis. EXAM: CT ANGIOGRAPHY HEAD AND NECK TECHNIQUE: Multidetector CT imaging of the head and neck was performed using the standard protocol during bolus administration of intravenous contrast. Multiplanar CT image reconstructions and MIPs were obtained to evaluate the vascular anatomy. Carotid stenosis measurements (when applicable) are obtained utilizing NASCET criteria, using the distal internal carotid diameter as the denominator. CONTRAST:  75mL OMNIPAQUE IOHEXOL 350 MG/ML SOLN COMPARISON:  Head CT without contrast 0918 hours today. FINDINGS: CTA NECK Skeleton: Prior cervical ACDF. Absent arthrodesis in the lowest fused segment and anterior extension of the right C7 cortical screw (series 6, image 51). No acute osseous abnormality identified. Upper chest: Calcified coronary artery  atherosclerosis, otherwise negative. Other neck: Negative; no neck mass or lymphadenopathy. Aortic arch: 3 vessel arch configuration with minimal atherosclerosis. Right carotid system: Negative. Left carotid system: Minimal soft plaque at the left ICA origin without stenosis. Vertebral arteries: No proximal right subclavian or right vertebral artery origin stenosis. Minimal calcified plaque near the right vertebral origin. The right vertebral appears somewhat dominant and is patent to the skull base without stenosis. Mild soft plaque in the proximal left subclavian artery without stenosis. There is up to moderate stenosis at the left vertebral artery origin related to soft plaque on series 9, image 162. The left vertebral artery is patent in the neck but somewhat diminutive compared to that on the right. The vessel remains patent to the C1 level, see below. CTA HEAD Posterior circulation: The left vertebral artery is occluded in the posterior fossa as it crosses the dura. Series 3, image 5 of the plain head CT earlier today demonstrated fairly codominant appearance of the vertebrals. The left PICA arises early, similar to that on the right, and is reconstituted. There is reconstituted flow at a fenestrated vertebrobasilar junction. The right vertebral artery V4 segment remains normal. Normal right PICA origin. The basilar artery remains normal. SCA and PCA origins are within normal limits. Posterior communicating arteries are diminutive or absent. Bilateral PCA branches are patent. The right PCA is mildly irregular. Anterior circulation: Both ICA siphons are patent. On the left moderate calcified plaque results in mild stenosis of the supraclinoid segment. On the right heavy calcified plaque results in moderate to severe cavernous segment and proximal supraclinoid stenosis. The right ophthalmic artery origin is normal. Patent carotid termini. Normal MCA and ACA origins. Anterior communicating artery and bilateral  ACA branches are patent. There is mild distal ACA irregularity. Left MCA M1 segment and bifurcation are patent although there is moderate stenosis of the inferior left M2 branch origin seen on series 10, image 26. Other left MCA branches are within normal limits. Right MCA M1 segment and trifurcation are patent without stenosis. There is mild right MCA branch irregularity. Venous sinuses: Patent. Anatomic variants: None. Review of the MIP images confirms the above findings IMPRESSION: 1. Positive for Left Vertebral Artery V4 occlusion. The Right vertebral artery and basilar remain normal. This was reviewed in person with Dr. Arther Dames on 08/08/2018 at [phone]  hours. 2. Superimposed moderate left vertebral artery origin stenosis appears related to soft plaque. 3. Heavily calcified ICA siphons with moderate Left and moderate to severe Right siphon stenosis. No carotid stenosis in the neck. 4. Moderate Left MCA M2 origin stenosis. 5. Lower cervical ACDF with pseudoarthrosis and hardware loosening at C6-C7. Electronically Signed   By: Odessa Fleming M.D.   On: 08/08/2018 10:01   Ct Head Code Stroke Wo Contrast`  Result Date: 08/08/2018 CLINICAL DATA:  Code stroke.  Persistent vertigo EXAM: CT HEAD WITHOUT CONTRAST TECHNIQUE: Contiguous axial images were obtained from the base of the skull through the vertex without intravenous contrast. COMPARISON:  06/02/2015 FINDINGS: Brain: No evidence of acute infarction, hemorrhage, hydrocephalus, extra-axial collection or mass lesion/mass effect. Moderate central predominant atrophy, progressed. Moderate chronic small vessel ischemic low-density in the cerebral white matter. Vascular: Atherosclerotic calcification.  No hyperdense vessel Skull: Negative Sinuses/Orbits: Negative Other: These results were communicated to Dr. Laurence Slate at 9:25 amon 3/6/2020by text page via the [employer] [employer] messaging system. ASPECTS [facility] Stroke Program Early CT Score) Not scored with this history IMPRESSION: 1.  No acute finding 2. Atrophy and chronic small vessel ischemia that has progressed from 2016. Electronically Signed   By: Marnee Spring M.D.   On: 08/08/2018 09:26    Procedures .Critical Care Performed by: Arthor Captain, PA-C Authorized by: Arthor Captain, PA-C   Critical care provider statement:    Critical care time (minutes):  45   Critical care was necessary to treat or prevent imminent or life-threatening deterioration of the following conditions:  CNS failure or compromise   Critical care was time spent personally by me on the following activities:  Discussions with consultants, evaluation of patient's response to treatment, examination of patient, ordering and performing treatments and interventions, ordering and review of laboratory studies, ordering and review of radiographic studies, pulse oximetry, re-evaluation of patient's condition, obtaining history from patient or surrogate and review of old charts   (including critical care time)  Medications Ordered in ED Medications  alteplase (ACTIVASE) 1 mg/mL infusion 78.7 mg (78.7 mg Intravenous New Bag/Given 08/08/18 0927)  labetalol (NORMODYNE,TRANDATE) injection 20 mg (has no administration in time range)    And  clevidipine (CLEVIPREX) infusion 0.5 mg/mL (has no administration in time range)   stroke: mapping our early stages of recovery book (has no administration in time range)  0.9 %  sodium chloride infusion (has no administration in time range)  acetaminophen (TYLENOL) tablet 650 mg (has no administration in time range)    Or  acetaminophen (TYLENOL) solution 650 mg (has no administration in time range)    Or  acetaminophen (TYLENOL) suppository 650 mg (has no administration in time range)  pantoprazole (PROTONIX) injection 40 mg (has no administration in time range)  senna-docusate (Senokot-S) tablet 1 tablet (has no administration in time range)  meclizine (ANTIVERT) tablet 25 mg (25 mg Oral Given 08/08/18 0903)    iohexol (OMNIPAQUE) 350 MG/ML injection 50 mL (50 mLs Intravenous Contrast Given 08/08/18 0944)     Initial Impression / Assessment and Plan / ED Course  I have reviewed the triage vital signs and the nursing notes.  Pertinent labs & imaging results that were available during my care of the patient were reviewed by me and considered in my medical decision making (see chart for details).    9:04 AM I spoke with Dr. Laurence Slate concerning the patient's symptoms.  Although his facial droop and aphasia have resolved the symptoms seem to cross multiple vessel  territories.  And shared decision we decided to initiate a code stroke.  No evidence of arrhythmia or atrial fibrillation on EKG  10:13 AM BP 127/77   Pulse 75   Temp 98.4 F (36.9 C)   Resp 11   Ht  (1.727 m)   Wt 87.4 kg   SpO2 98%   BMI 29.30 kg/m  Patient seen emergently by the stroke team.  His creatinine is slightly elevated at 1.5 on i-STAT testing is to be the patient's baseline.  Hyperglycemic at 274. Patient CT head negative and TPA was administered by neurology with patient education and consent.  CT angio does show an acute left-sided vertebral artery occlusion. The patient will be admitted to the neuro ICU by the neurology service.  His CBC shows no elevated white blood cell count or other abnormality.       Final Clinical Impressions(s) / ED Diagnoses   Final diagnoses:  Cerebrovascular accident (CVA) due to occlusion of left vertebral artery [facility])    ED Discharge Orders    None       Arthor Captain, PA-C 08/08/18 1011    Arthor Captain, PA-C 08/08/18 1013    Shaune Pollack, MD 08/08/18 2028

## 2018-08-09 ENCOUNTER — Inpatient Hospital Stay (HOSPITAL_COMMUNITY): Payer: No Typology Code available for payment source

## 2018-08-09 ENCOUNTER — Encounter (HOSPITAL_COMMUNITY): Payer: Self-pay | Admitting: *Deleted

## 2018-08-09 DIAGNOSIS — E1159 Type 2 diabetes mellitus with other circulatory complications: Secondary | ICD-10-CM

## 2018-08-09 DIAGNOSIS — R739 Hyperglycemia, unspecified: Secondary | ICD-10-CM

## 2018-08-09 DIAGNOSIS — Z794 Long term (current) use of insulin: Secondary | ICD-10-CM

## 2018-08-09 DIAGNOSIS — E785 Hyperlipidemia, unspecified: Secondary | ICD-10-CM

## 2018-08-09 DIAGNOSIS — I10 Essential (primary) hypertension: Secondary | ICD-10-CM

## 2018-08-09 LAB — LIPID PANEL
CHOL/HDL RATIO: 6.3 ratio
CHOLESTEROL: 221 mg/dL — AB (ref 0–200)
HDL: 35 mg/dL — ABNORMAL LOW (ref >40)
LDL Cholesterol: 161 mg/dL — ABNORMAL HIGH (ref 0–99)
Triglycerides: 124 mg/dL (ref <150)
VLDL: 25 mg/dL (ref 0–40)

## 2018-08-09 LAB — GLUCOSE, CAPILLARY
GLUCOSE-CAPILLARY: 362 mg/dL — AB (ref 70–99)
Glucose-Capillary: 194 mg/dL — ABNORMAL HIGH (ref 70–99)
Glucose-Capillary: 185 mg/dL — ABNORMAL HIGH (ref 70–99)
Glucose-Capillary: 283 mg/dL — ABNORMAL HIGH (ref 70–99)

## 2018-08-09 LAB — HEMOGLOBIN A1C
Hgb A1c MFr Bld: 9.6 % — ABNORMAL HIGH (ref 4.8–5.6)
MEAN PLASMA GLUCOSE: 228.82 mg/dL

## 2018-08-09 LAB — HIV ANTIBODY (ROUTINE TESTING W REFLEX): HIV Screen 4th Generation wRfx: NONREACTIVE

## 2018-08-09 MED ORDER — CLOPIDOGREL BISULFATE 75 MG PO TABS
75.0000 mg | ORAL_TABLET | Freq: Every day | ORAL | Status: DC
  Administered 2018-08-09 – 2018-08-12 (×4): 75 mg via ORAL
  Filled 2018-08-09 (×4): qty 1

## 2018-08-09 MED ORDER — ROSUVASTATIN CALCIUM 20 MG PO TABS
40.0000 mg | ORAL_TABLET | Freq: Every day | ORAL | Status: DC
  Administered 2018-08-09 – 2018-08-12 (×4): 40 mg via ORAL
  Filled 2018-08-09 (×4): qty 2

## 2018-08-09 MED ORDER — INSULIN ASPART 100 UNIT/ML ~~LOC~~ SOLN
6.0000 [IU] | Freq: Once | SUBCUTANEOUS | Status: AC
  Administered 2018-08-09: 6 [IU] via SUBCUTANEOUS

## 2018-08-09 MED ORDER — INSULIN ASPART 100 UNIT/ML ~~LOC~~ SOLN
0.0000 [IU] | Freq: Three times a day (TID) | SUBCUTANEOUS | Status: DC
  Administered 2018-08-10: 5 [IU] via SUBCUTANEOUS
  Administered 2018-08-10: 2 [IU] via SUBCUTANEOUS
  Administered 2018-08-10: 9 [IU] via SUBCUTANEOUS

## 2018-08-09 MED ORDER — CALCIUM CARBONATE ANTACID 500 MG PO CHEW
2.0000 | CHEWABLE_TABLET | ORAL | Status: DC | PRN
  Filled 2018-08-09 (×2): qty 2

## 2018-08-09 MED ORDER — INSULIN ASPART 100 UNIT/ML ~~LOC~~ SOLN: 0.0000 [IU] | Freq: Three times a day (TID) | SUBCUTANEOUS | Status: DC

## 2018-08-09 MED ORDER — ASPIRIN EC 81 MG PO TBEC
81.0000 mg | DELAYED_RELEASE_TABLET | Freq: Every day | ORAL | Status: DC
  Administered 2018-08-09 – 2018-08-12 (×4): 81 mg via ORAL
  Filled 2018-08-09 (×4): qty 1

## 2018-08-09 MED ORDER — ROSUVASTATIN CALCIUM 20 MG PO TABS: 20.0000 mg | ORAL_TABLET | Freq: Every day | ORAL | Status: DC

## 2018-08-09 NOTE — Progress Notes (Signed)
STROKE TEAM PROGRESS NOTE   SUBJECTIVE (INTERVAL HISTORY) His RN is at the bedside.  Pt sitting in bed and stated that his symptoms are resolved. Denies any dizziness or numbness now. On exam still has mild left HTS dysmetria.   OBJECTIVE Vitals:   08/09/18 0700 08/09/18 0800 08/09/18 0857 08/09/18 1026  BP: 117/71 135/77 132/90 (!) 152/102  Pulse: 73 73 75   Resp: 15 19 (!) 21 17  Temp:  98.4 F (36.9 C)    TempSrc:  Oral    SpO2: 97% 97% 96%   Weight:      Height:        CBC:  Recent Labs  Lab 08/08/18 0857  WBC 6.1  NEUTROABS 4.6  HGB 14.9  HCT 45.5  MCV 86.0  PLT 231    Basic Metabolic Panel:  Recent Labs  Lab 08/08/18 0857 08/08/18 0908  NA 132*  --   K 3.9  --   CL 102  --   CO2 21*  --   GLUCOSE 297*  --   BUN 18  --   CREATININE 1.59* 1.50*  CALCIUM 9.3  --     Lipid Panel:     Component Value Date/Time   CHOL 221 (H) 08/09/2018 0428   CHOL 226 (H) 12/30/2017 0846   TRIG 124 08/09/2018 0428   HDL 35 (L) 08/09/2018 0428   HDL 40 12/30/2017 0846   CHOLHDL 6.3 08/09/2018 0428   VLDL 25 08/09/2018 0428   LDLCALC 161 (H) 08/09/2018 0428   LDLCALC 150 (H) 12/30/2017 0846   HgbA1c:  Lab Results  Component Value Date   HGBA1C 9.6 (H) 08/09/2018   Urine Drug Screen: No results found for: LABOPIA, COCAINSCRNUR, LABBENZ, AMPHETMU, THCU, LABBARB  Alcohol Level No results found for: ETH  IMAGING  Ct Angio Head W Or Wo Contrast Ct Angio Neck W Or Wo Contrast 08/08/2018 IMPRESSION:  1. Positive for Left Vertebral Artery V4 occlusion. The Right vertebral artery and basilar remain normal.  2. Superimposed moderate left vertebral artery origin stenosis appears related to soft plaque.  3. Heavily calcified ICA siphons with moderate Left and moderate to severe Right siphon stenosis. No carotid stenosis in the neck.  4. Moderate Left MCA M2 origin stenosis.  5. Lower cervical ACDF with pseudoarthrosis and hardware loosening at C6-C7.   Ct Head Code  Stroke Wo Contrast 08/08/2018 IMPRESSION:  1. No acute finding  2. Atrophy and chronic small vessel ischemia that has progressed from 2016.   MRI Brain WO Contrast 08/09/2018 IMPRESSION: 1. No acute infarct. 2. Chronic small vessel ischemia and volume loss.  Transthoracic Echocardiogram  08/08/2018 IMPRESSIONS  1. The left ventricle has normal systolic function with an ejection fraction of 60-65%. The cavity size was normal. Left ventricular diastolic parameters were normal.  2. The right ventricle has normal systolic function. The cavity was normal. There is no increase in right ventricular wall thickness.  3. Left atrial size was mildly dilated.  4. The mitral valve is degenerative. Mild thickening of the mitral valve leaflet.  5. The tricuspid valve is normal in structure.  6. The aortic valve is tricuspid Moderate thickening of the aortic valve Moderate calcification of the aortic valve.  7. The pulmonic valve was normal in structure.  8. The interatrial septum was not well visualized.    PHYSICAL EXAM  Temp:  [97.8 F (36.6 C)-98.7 F (37.1 C)] 97.8 F (36.6 C) (03/07 1200) Pulse Rate:  [70-89] 75 (03/07 0857) Resp:  [  12-24] 12 (03/07 1200) BP: (109-157)/(59-102) 132/97 (03/07 1200) SpO2:  [92 %-100 %] 98 % (03/07 1200)  General - Well nourished, well developed, in no apparent distress.  Ophthalmologic - fundi not visualized due to noncooperation.  Cardiovascular - Regular rate and rhythm.  Mental Status -  Level of arousal and orientation to time, place, and person were intact. Language including expression, naming, repetition, comprehension was assessed and found intact. Fund of Knowledge was assessed and was intact.  Cranial Nerves II - XII - II - Visual field intact OU. III, IV, VI - Extraocular movements intact. V - Facial sensation intact bilaterally. VII - Facial movement intact bilaterally. VIII - Hearing & vestibular intact bilaterally. X - Palate  elevates symmetrically. XI - Chin turning & shoulder shrug intact bilaterally. XII - Tongue protrusion intact.  Motor Strength - The patient's strength was normal in all extremities and pronator drift was absent.  Bulk was normal and fasciculations were absent.   Motor Tone - Muscle tone was assessed at the neck and appendages and was normal.  Reflexes - The patient's reflexes were symmetrical in all extremities and he had no pathological reflexes.  Sensory - Light touch, temperature/pinprick were assessed and were symmetrical.    Coordination - The patient had normal movements in the hands and right foot with no ataxia or dysmetria.  Mild left heel-to-shin dysmetria.  Tremor was absent.  Gait and Station - deferred.    ASSESSMENT/PLAN [name] is a 68 y.o. male with history of HTN, ADHD, DM, HLD and currently being treated for an ulcer presenting with dizziness, gait difficulties, right facial numbness, facial droop and word finding difficulties. tPA Given @ 0927 08/08/2018.  TIA vs. left cerebellum DWI negative infarct due to left VA occlusion s/p tPA, likely due to left VA dissection versus atherosclerosis given risk factors and ICA siphon atherosclerosis  Resultant left heel-to-shin mild dysmetria  CT head - No acute finding   MRI head - No acute infarct  CTA H&N - Left Vertebral Artery V4 occlusion. Heavily calcified ICA siphons with moderate Left and moderate to severe Right siphon stenosis.  2D Echo  - EF 60 - 65%. No cardiac source of emboli identified.   LDL - 161  HgbA1c - 9.6  UDS - pending  VTE prophylaxis - SCDs  Diet - Heart healthy / carb modified with thin liquids.  aspirin 81 mg daily prior to admission, now on aspirin 81 mg daily and clopidogrel 75 mg daily.  Continue DAPT for 3 weeks and then Plavix alone.  Patient counseled to be compliant with his antithrombotic medications  Ongoing aggressive stroke risk factor management  Therapy  recommendations:  pending  Disposition:  Pending  Left VA occlusion  CTA head and neck showed left V4 occlusion  Exam also showed left mild dysmetria  Concerning for left cerebellum DWI negative infarct  Unclear etiology, dissection versus atherosclerosis given risk factors and ICA siphon atherosclerosis  On DAPT, continue DAPT for 3 weeks and then Plavix alone  Hypertension  Stable . Permissive hypertension (OK if <180/105) but gradually normalize in 3-5 days . Long-term BP goal normotensive  Hyperlipidemia  Lipid lowering medication PTA:  Crestor 20 mg daily  LDL 161, goal < 70  Current lipid lowering medication: Crestor 40 mg daily  Continue statin at discharge  Diabetes  HgbA1c 9.6, goal < 7.0  Uncontrolled  Hyperglycemia  SSI  CBG monitoring  Close PCP follow-up and DM education  Other Stroke Risk  Factors  Advanced age   Other Active Problems  Lower cervical ACDF with pseudoarthrosis and hardware loosening at C6-C7.   CKD stage II, creatinine 1.50  PUD  ADHD   Hospital day # 1  This patient is critically ill due to suspected stroke status post TPA, hyperglycemia, left VA occlusion and at significant risk of neurological worsening, death form recurrent stroke, hemorrhagic conversion, DKA, heart failure. This patient's care requires constant monitoring of vital signs, hemodynamics, respiratory and cardiac monitoring, review of multiple databases, neurological assessment, discussion with family, other specialists and medical decision making of high complexity. I spent 35 minutes of neurocritical care time in the care of this patient.  Marvel Plan, MD PhD Stroke Neurology 08/09/2018 2:10 PM   To contact Stroke Continuity provider, please refer to [email or web address]. After hours, contact General Neurology

## 2018-08-09 NOTE — Evaluation (Signed)
Speech Language Pathology Evaluation Patient Details Name: Jeff [name] MRN: [id number] DOB: [birth year] Today's Date: 08/09/2018 Time: 7096-2836 SLP Time Calculation (min) (ACUTE ONLY): 14 min  Problem List:  Patient Active Problem List   Diagnosis Date Noted  . Stroke (HCC) 08/08/2018  . Diabetes (HCC) 07/25/2015  . Neck injury 06/22/2015  . Hearing loss in right ear 10/14/2011  . Agent orange exposure 10/14/2011  . ADD (attention deficit disorder) 10/14/2011  . Hyperlipidemia 10/14/2011  . BMI 28.0-28.9,adult 10/14/2011   Past Medical History:  Past Medical History:  Diagnosis Date  . ADHD (attention deficit hyperactivity disorder)   . Diabetes mellitus   . Hyperlipidemia   . Hypertension    Past Surgical History:  Past Surgical History:  Procedure Laterality Date  . NECK SURGERY    . SPINE SURGERY     Cervical spine x 2; [employer]; [employer].   HPI:  Jeff [name] is an 68 y.o. male  who presented with dizziness, right facial numbness and difficulty walking. Recieved tPa in ED. MRI no acute infarct, chronic small vessel ischemia and volume loss.  With PMH of HTN, DM, HLD.   Assessment / Plan / Recommendation Clinical Impression  Pt's cognition within normal range on various subtests of MOCA. Unable to complete visuospatial portion due to visual disturbance from diabetes. Speech is 100% intelligible intelligible. Other aspects of cognition appear WNL in regards to attention, verbal problem solving, memory and executive functions. Pt asking questions re: TIA (reason, prevention etc). Pt's wife and daughter at bedside and have state speech was problematic yesterday but at baseline currently. No f/u needed.      SLP Assessment  SLP Recommendation/Assessment: Patient does not need any further Speech Lanaguage Pathology Services    Follow Up Recommendations  None    Frequency and Duration           SLP Evaluation Cognition  Overall Cognitive Status: Within Functional Limits  for tasks assessed       Comprehension  Auditory Comprehension Overall Auditory Comprehension: Appears within functional limits for tasks assessed Visual Recognition/Discrimination Discrimination: Not tested Reading Comprehension Reading Status: (visual disturbance )    Expression Expression Primary Mode of Expression: Verbal Verbal Expression Overall Verbal Expression: Appears within functional limits for tasks assessed Written Expression Written Expression: Not tested   Oral / Motor  Oral Motor/Sensory Function Overall Oral Motor/Sensory Function: Within functional limits Motor Speech Overall Motor Speech: Appears within functional limits for tasks assessed   GO                    Royce Macadamia 08/09/2018, 12:47 PM   Breck Coons Joslyne Marshburn M.Ed Nurse, children's [phone] Office [phone]

## 2018-08-09 NOTE — Progress Notes (Signed)
Per Dr. Roda Shutters, pt ok to go to MRI w/out RN.  Transported to MRI w/ NT.

## 2018-08-09 NOTE — Progress Notes (Signed)
PT Cancellation Note  Patient Details Name: Jeff [name] MRN: [id number] DOB: 04/14/1951   Cancelled Treatment:    Reason Eval/Treat Not Completed: Patient at procedure or test/unavailable to MRI. PT will continue to f/u with pt acutely as available.    [name] [name] 08/09/2018, 10:36 AM

## 2018-08-09 NOTE — Progress Notes (Signed)
PT Cancellation Note  Patient Details Name: Jeff [name] MRN: [id number] DOB: Jun 09, 1950   Cancelled Treatment:    Reason Eval/Treat Not Completed: Active bedrest order. Pt currently with strict bed rest orders in place. PT will f/u with pt acutely as available and appropriate. Will await updated activity orders prior to initiating evaluation.    [name] Jeff [name] 08/09/2018, 7:50 AM

## 2018-08-10 ENCOUNTER — Inpatient Hospital Stay (HOSPITAL_COMMUNITY): Payer: No Typology Code available for payment source

## 2018-08-10 DIAGNOSIS — R42 Dizziness and giddiness: Secondary | ICD-10-CM

## 2018-08-10 DIAGNOSIS — E0821 Diabetes mellitus due to underlying condition with diabetic nephropathy: Secondary | ICD-10-CM

## 2018-08-10 LAB — GLUCOSE, CAPILLARY
Glucose-Capillary: 329 mg/dL — ABNORMAL HIGH (ref 70–99)
Glucose-Capillary: 169 mg/dL — ABNORMAL HIGH (ref 70–99)
Glucose-Capillary: 276 mg/dL — ABNORMAL HIGH (ref 70–99)
Glucose-Capillary: 299 mg/dL — ABNORMAL HIGH (ref 70–99)
Glucose-Capillary: 271 mg/dL — ABNORMAL HIGH (ref 70–99)

## 2018-08-10 LAB — CBC
HCT: 41.2 % (ref 39.0–52.0)
Hemoglobin: 14.2 g/dL (ref 13.0–17.0)
MCH: 29 pg (ref 26.0–34.0)
MCHC: 34.5 g/dL (ref 30.0–36.0)
MCV: 84.1 fL (ref 80.0–100.0)
NRBC: 0 % (ref 0.0–0.2)
PLATELETS: 230 10*3/uL (ref 150–400)
RBC: 4.9 MIL/uL (ref 4.22–5.81)
RDW: 12.6 % (ref 11.5–15.5)
WBC: 6.8 10*3/uL (ref 4.0–10.5)

## 2018-08-10 LAB — BASIC METABOLIC PANEL
ANION GAP: 9 (ref 5–15)
BUN: 16 mg/dL (ref 8–23)
CO2: 23 mmol/L (ref 22–32)
Calcium: 9.3 mg/dL (ref 8.9–10.3)
Chloride: 103 mmol/L (ref 98–111)
Creatinine, Ser: 1.32 mg/dL — ABNORMAL HIGH (ref 0.61–1.24)
GFR calc Af Amer: >60 mL/min (ref >60)
GFR, EST NON AFRICAN AMERICAN: 55 mL/min — AB (ref >60)
Glucose, Bld: 282 mg/dL — ABNORMAL HIGH (ref 70–99)
Potassium: 4.5 mmol/L (ref 3.5–5.1)
Sodium: 135 mmol/L (ref 135–145)

## 2018-08-10 MED ORDER — INSULIN ASPART 100 UNIT/ML ~~LOC~~ SOLN
0.0000 [IU] | Freq: Three times a day (TID) | SUBCUTANEOUS | Status: DC
  Administered 2018-08-11: 4 [IU] via SUBCUTANEOUS
  Administered 2018-08-11: 7 [IU] via SUBCUTANEOUS
  Administered 2018-08-12: 3 [IU] via SUBCUTANEOUS

## 2018-08-10 MED ORDER — INSULIN NPH (HUMAN) (ISOPHANE) 100 UNIT/ML ~~LOC~~ SUSP
60.0000 [IU] | Freq: Every day | SUBCUTANEOUS | Status: DC
  Administered 2018-08-10: 60 [IU] via SUBCUTANEOUS
  Filled 2018-08-10: qty 10

## 2018-08-10 MED ORDER — INSULIN NPH (HUMAN) (ISOPHANE) 100 UNIT/ML ~~LOC~~ SUSP
110.0000 [IU] | Freq: Every day | SUBCUTANEOUS | Status: DC
  Administered 2018-08-11: 110 [IU] via SUBCUTANEOUS
  Filled 2018-08-10: qty 10

## 2018-08-10 MED ORDER — INSULIN ASPART 100 UNIT/ML ~~LOC~~ SOLN
0.0000 [IU] | Freq: Every day | SUBCUTANEOUS | Status: DC
  Administered 2018-08-10: 3 [IU] via SUBCUTANEOUS

## 2018-08-10 MED ORDER — INSULIN NPH (HUMAN) (ISOPHANE) 100 UNIT/ML ~~LOC~~ SUSP
60.0000 [IU] | Freq: Every morning | SUBCUTANEOUS | Status: DC
  Filled 2018-08-10: qty 10

## 2018-08-10 MED ORDER — SODIUM CHLORIDE 0.9 % IV BOLUS
250.0000 mL | Freq: Once | INTRAVENOUS | Status: AC
  Administered 2018-08-10: 250 mL via INTRAVENOUS

## 2018-08-10 MED ORDER — METFORMIN HCL 500 MG PO TABS
1000.0000 mg | ORAL_TABLET | Freq: Two times a day (BID) | ORAL | Status: DC
  Administered 2018-08-10 – 2018-08-12 (×4): 1000 mg via ORAL
  Filled 2018-08-10 (×4): qty 2

## 2018-08-10 NOTE — Evaluation (Signed)
Physical Therapy Evaluation Patient Details Name: Jeff [name] MRN: [id number] DOB: Sep 11, 1950 Today's Date: 08/10/2018   History of Present Illness  Jeff [name] is an 68 y.o. male  who presented with dizziness, right facial numbness and difficulty walking. Recieved tPa in ED. MRI no acute infarct, chronic small vessel ischemia and volume loss.  With PMH of HTN, DM, HLD.     Clinical Impression  Pt admitted with above diagnosis. Pt currently with functional limitations due to the deficits listed below (see PT Problem List). PTA, patient independent with all mobility lives with his wife and has avail 24 support if needed. Today patient with coordination deficits, dysmetria noted LUE. Ataxic gait with near falls and L sided LOB during short distance gait. Mod A for safe OOB mobility today. Patient and family motivated to return to PLOF.   Pt will benefit from skilled PT to increase their independence and safety with mobility to allow discharge to the venue listed below.       Follow Up Recommendations CIR    Equipment Recommendations  (TBD)    Recommendations for Other Services Rehab consult     Precautions / Restrictions Precautions Precautions: Fall Restrictions Weight Bearing Restrictions: No      Mobility  Bed Mobility               General bed mobility comments: in chair at entry  Transfers Overall transfer level: Needs assistance Equipment used: Rolling walker (2 wheeled) Transfers: Sit to/from Stand Sit to Stand: Min assist            Ambulation/Gait Ambulation/Gait assistance: Min assist;Mod assist Gait Distance (Feet): 25 Feet Assistive device: Rolling walker (2 wheeled) Gait Pattern/deviations: Step-to pattern Gait velocity: decreased   General Gait Details: patient ambulating with ataxia and L sided lean, path deviation, LOB. mulitple times requiring mod A to stabilize. unsafe toa mbulate without hands on assisatnce and RW  Stairs             Wheelchair Mobility    Modified Rankin (Stroke Patients Only) Modified Rankin (Stroke Patients Only) Pre-Morbid Rankin Score: No symptoms Modified Rankin: Moderately severe disability     Balance Overall balance assessment: Needs assistance   Sitting balance-Leahy Scale: Fair       Standing balance-Leahy Scale: Poor                               Pertinent Vitals/Pain Pain Assessment: No/denies pain    Home Living Family/patient expects to be discharged to:: Private residence Living Arrangements: Spouse/significant other Available Help at Discharge: Family;Available 24 hours/day Type of Home: House Home Access: Level entry     Home Layout: One level Home Equipment: None      Prior Function Level of Independence: Independent               Hand Dominance        Extremity/Trunk Assessment   Upper Extremity Assessment Upper Extremity Assessment: Overall WFL for tasks assessed(L handed dysmetria noted finger to nose. AM normal. )    Lower Extremity Assessment Lower Extremity Assessment: Overall WFL for tasks assessed       Communication      Cognition Arousal/Alertness: Awake/alert   Overall Cognitive Status: Within Functional Limits for tasks assessed  General Comments General comments (skin integrity, edema, etc.): Discussion with family over roles of post acute rehab and PT     Exercises     Assessment/Plan    PT Assessment Patient needs continued PT services  PT Problem List Decreased coordination       PT Treatment Interventions DME instruction;Gait training;Stair training;Functional mobility training;Therapeutic exercise;Therapeutic activities    PT Goals (Current goals can be found in the Care Plan section)  Acute Rehab PT Goals Patient Stated Goal: go to rehab PT Goal Formulation: With patient/family Time For Goal Achievement: 08/24/18 Potential to Achieve  Goals: Good    Frequency Min 4X/week   Barriers to discharge        Co-evaluation               AM-PAC PT "6 Clicks" Mobility  Outcome Measure Help needed turning from your back to your side while in a flat bed without using bedrails?: None Help needed moving from lying on your back to sitting on the side of a flat bed without using bedrails?: A Little Help needed moving to and from a bed to a chair (including a wheelchair)?: A Little Help needed standing up from a chair using your arms (e.g., wheelchair or bedside chair)?: A Lot Help needed to walk in hospital room?: A Lot Help needed climbing 3-5 steps with a railing? : Total 6 Click Score: 15    End of Session Equipment Utilized During Treatment: Gait belt Activity Tolerance: Patient tolerated treatment well Patient left: in chair;with call bell/phone within reach;with family/visitor present Nurse Communication: Mobility status PT Visit Diagnosis: Unsteadiness on feet (R26.81)    Time: 3832-9191 PT Time Calculation (min) (ACUTE ONLY): 23 min   Charges:   PT Evaluation $PT Eval Low Complexity: 1 Low PT Treatments $Gait Training: 8-22 mins        Etta Grandchild, PT, DPT Acute Rehabilitation Services Pager: [phone] Office: [phone]    Etta Grandchild 08/10/2018, 6:46 PM

## 2018-08-10 NOTE — Progress Notes (Signed)
STROKE TEAM PROGRESS NOTE   SUBJECTIVE (INTERVAL HISTORY) His RN, wife and daughter are at the bedside. The pt states he felt great yesterday but today he feels weak and very dizzy when he gets up. Denies dizziness when lying in bed even with turning his head. The nurse check orthostatic vitals and there were no significant changes. Bp is a bit low at times - systolic 102 ; 108 ; 133. PT and OT will evaluate the patient today. He has mild left finger to nose dysmetria on exam.   OBJECTIVE Vitals:   08/09/18 2000 08/09/18 2340 08/10/18 0316 08/10/18 0738  BP: 133/79 (!) 108/58 102/67   Pulse: 80 74 72   Resp: Temp: (!) 97.5 F (36.4 C) 98.1 F (36.7 C) 97.9 F (36.6 C) 97.7 F (36.5 C)  TempSrc: Oral Oral Oral Oral  SpO2: 96% 97% 96% 97%  Weight:      Height:        CBC:  Recent Labs  Lab 08/08/18 0857  WBC 6.1  NEUTROABS 4.6  HGB 14.9  HCT 45.5  MCV 86.0  PLT 231    Basic Metabolic Panel:  Recent Labs  Lab 08/08/18 0857 08/08/18 0908  NA 132*  --   K 3.9  --   CL 102  --   CO2 21*  --   GLUCOSE 297*  --   BUN 18  --   CREATININE 1.59* 1.50*  CALCIUM 9.3  --     Lipid Panel:     Component Value Date/Time   CHOL 221 (H) 08/09/2018 0428   CHOL 226 (H) 12/30/2017 0846   TRIG 124 08/09/2018 0428   HDL 35 (L) 08/09/2018 0428   HDL 40 12/30/2017 0846   CHOLHDL 6.3 08/09/2018 0428   VLDL 25 08/09/2018 0428   LDLCALC 161 (H) 08/09/2018 0428   LDLCALC 150 (H) 12/30/2017 0846   HgbA1c:  Lab Results  Component Value Date   HGBA1C 9.6 (H) 08/09/2018   Urine Drug Screen: No results found for: LABOPIA, COCAINSCRNUR, LABBENZ, AMPHETMU, THCU, LABBARB  Alcohol Level No results found for: ETH  IMAGING  Ct Angio Head W Or Wo Contrast Ct Angio Neck W Or Wo Contrast 08/08/2018 IMPRESSION:  1. Positive for Left Vertebral Artery V4 occlusion. The Right vertebral artery and basilar remain normal.  2. Superimposed moderate left vertebral artery origin  stenosis appears related to soft plaque.  3. Heavily calcified ICA siphons with moderate Left and moderate to severe Right siphon stenosis. No carotid stenosis in the neck.  4. Moderate Left MCA M2 origin stenosis.  5. Lower cervical ACDF with pseudoarthrosis and hardware loosening at C6-C7.   Ct Head Code Stroke Wo Contrast 08/08/2018 IMPRESSION:  1. No acute finding  2. Atrophy and chronic small vessel ischemia that has progressed from 2016.   MRI Brain WO Contrast 08/09/2018 IMPRESSION: 1. No acute infarct. 2. Chronic small vessel ischemia and volume loss.  Transthoracic Echocardiogram  08/08/2018 IMPRESSIONS  1. The left ventricle has normal systolic function with an ejection fraction of 60-65%. The cavity size was normal. Left ventricular diastolic parameters were normal.  2. The right ventricle has normal systolic function. The cavity was normal. There is no increase in right ventricular wall thickness.  3. Left atrial size was mildly dilated.  4. The mitral valve is degenerative. Mild thickening of the mitral valve leaflet.  5. The tricuspid valve is normal in structure.  6. The aortic valve is tricuspid  Moderate thickening of the aortic valve Moderate calcification of the aortic valve.  7. The pulmonic valve was normal in structure.  8. The interatrial septum was not well visualized.    PHYSICAL EXAM  Temp:  [97.5 F (36.4 C)-98.1 F (36.7 C)] 97.7 F (36.5 C) (03/08 0738) Pulse Rate:  [72-80] 72 (03/08 0316) Resp:  [18-20] 18 (03/08 0316) BP: (102-140)/(58-85) 102/67 (03/08 0316) SpO2:  [96 %-99 %] 97 % (03/08 0738)  General - Well nourished, well developed, in no apparent distress.  Ophthalmologic - fundi not visualized due to noncooperation.  Cardiovascular - Regular rate and rhythm.  Mental Status -  Level of arousal and orientation to time, place, and person were intact. Language including expression, naming, repetition, comprehension was assessed and found  intact. Fund of Knowledge was assessed and was intact.  Cranial Nerves II - XII - II - Visual field intact OU. III, IV, VI - Extraocular movements intact. V - Facial sensation intact bilaterally. VII - Facial movement intact bilaterally. VIII - Hearing & vestibular intact bilaterally. X - Palate elevates symmetrically. XI - Chin turning & shoulder shrug intact bilaterally. XII - Tongue protrusion intact.  Motor Strength - The patient's strength was normal in all extremities and pronator drift was absent.  Bulk was normal and fasciculations were absent.   Motor Tone - Muscle tone was assessed at the neck and appendages and was normal.  Reflexes - The patient's reflexes were symmetrical in all extremities and he had no pathological reflexes.  Sensory - Light touch, temperature/pinprick were assessed and were symmetrical.    Coordination - The patient had normal movements in the right hand and foot with no ataxia or dysmetria.  Left finger-to-nose and heel-to-shin dysmetria.  Tremor was absent.  Gait and Station - deferred.    ASSESSMENT/PLAN [name] is a 68 y.o. male with history of HTN, ADHD, DM, HLD and currently being treated for an ulcer presenting with dizziness, gait difficulties, right facial numbness, facial droop and word finding difficulties. tPA Given @ 0927 08/08/2018.  Left cerebellum DWI negative infarct due to left VA occlusion s/p tPA, likely due to left VA dissection versus atherosclerosis given risk factors and ICA siphon atherosclerosis  Resultant left heel-to-shin mild dysmetria  CT head - No acute finding   MRI head 08/09/2018 - No acute infarct  CTA H&N - Left Vertebral Artery V4 occlusion. Heavily calcified ICA siphons with moderate Left and moderate to severe Right siphon stenosis.  MRI head repeat pending  2D Echo  - EF 60 - 65%. No cardiac source of emboli identified.   LDL - 161  HgbA1c - 9.6  UDS - pending  VTE prophylaxis - SCDs  Diet -  Heart healthy / carb modified with thin liquids.  aspirin 81 mg daily prior to admission, now on aspirin 81 mg daily and clopidogrel 75 mg daily.  Continue DAPT for 3 weeks and then Plavix alone.  Patient counseled to be compliant with his antithrombotic medications  Ongoing aggressive stroke risk factor management  Therapy recommendations:  pending  Disposition:  Pending  Left VA occlusion  CTA head and neck showed left V4 occlusion  Exam also showed left mild dysmetria  Concerning for left cerebellum DWI negative infarct  Unclear etiology, dissection versus atherosclerosis given risk factors and ICA siphon atherosclerosis  On DAPT, continue DAPT for 3 weeks and then Plavix alone  Hypertension  Stable . Long-term BP goal normotensive  Hyperlipidemia  Lipid lowering medication PTA:  Crestor 20 mg daily  LDL 161, goal < 70  Current lipid lowering medication: Crestor 40 mg daily  Continue statin at discharge  Diabetes  HgbA1c 9.6, goal < 7.0  Uncontrolled  Hyperglycemia  Resume home insulin 110 U Qam  Resume home metformin  SSI resistant protocol  CBG monitoring  Close PCP follow-up and DM education  Other Stroke Risk Factors  Advanced age   Other Active Problems  Lower cervical ACDF with pseudoarthrosis and hardware loosening at C6-C7.   CKD stage II, creatinine 1.50  PUD  ADHD  Hospital day #2   Marvel Plan, MD PhD Stroke Neurology 08/10/2018 5:40 PM     To contact Stroke Continuity provider, please refer to [email or web address]. After hours, contact General Neurology

## 2018-08-11 DIAGNOSIS — M26609 Unspecified temporomandibular joint disorder, unspecified side: Secondary | ICD-10-CM

## 2018-08-11 DIAGNOSIS — G463 Brain stem stroke syndrome: Secondary | ICD-10-CM

## 2018-08-11 LAB — CBC
HCT: 40.9 % (ref 39.0–52.0)
Hemoglobin: 13.9 g/dL (ref 13.0–17.0)
MCH: 28.3 pg (ref 26.0–34.0)
MCHC: 34 g/dL (ref 30.0–36.0)
MCV: 83.1 fL (ref 80.0–100.0)
Platelets: 216 10*3/uL (ref 150–400)
RBC: 4.92 MIL/uL (ref 4.22–5.81)
RDW: 12.5 % (ref 11.5–15.5)
WBC: 7.9 10*3/uL (ref 4.0–10.5)
nRBC: 0 % (ref 0.0–0.2)

## 2018-08-11 LAB — BASIC METABOLIC PANEL
Anion gap: 10 (ref 5–15)
BUN: 15 mg/dL (ref 8–23)
CALCIUM: 9 mg/dL (ref 8.9–10.3)
CO2: 21 mmol/L — AB (ref 22–32)
CREATININE: 1.27 mg/dL — AB (ref 0.61–1.24)
Chloride: 104 mmol/L (ref 98–111)
GFR calc Af Amer: >60 mL/min (ref >60)
GFR calc non Af Amer: 57 mL/min — ABNORMAL LOW (ref >60)
Glucose, Bld: 185 mg/dL — ABNORMAL HIGH (ref 70–99)
Potassium: 3.8 mmol/L (ref 3.5–5.1)
Sodium: 135 mmol/L (ref 135–145)

## 2018-08-11 LAB — GLUCOSE, CAPILLARY
Glucose-Capillary: 166 mg/dL — ABNORMAL HIGH (ref 70–99)
Glucose-Capillary: 92 mg/dL (ref 70–99)
Glucose-Capillary: 241 mg/dL — ABNORMAL HIGH (ref 70–99)
Glucose-Capillary: 95 mg/dL (ref 70–99)

## 2018-08-11 MED ORDER — INSULIN NPH (HUMAN) (ISOPHANE) 100 UNIT/ML ~~LOC~~ SUSP
40.0000 [IU] | Freq: Two times a day (BID) | SUBCUTANEOUS | Status: DC
  Administered 2018-08-12: 40 [IU] via SUBCUTANEOUS

## 2018-08-11 MED ORDER — INSULIN ASPART 100 UNIT/ML ~~LOC~~ SOLN
6.0000 [IU] | Freq: Three times a day (TID) | SUBCUTANEOUS | Status: DC
  Administered 2018-08-11 – 2018-08-12 (×2): 6 [IU] via SUBCUTANEOUS

## 2018-08-11 NOTE — Progress Notes (Signed)
   08/11/18 0826 08/11/18 0829  Vitals  BP (!) 168/140 120/78  MAP (mmHg) 147 91  BP Location Left Arm Left Arm  BP Method Automatic Automatic  Patient Position (if appropriate) Sitting Lying  Pulse Rate 79 74    Patient stated he felt light headed like he was about to "pass out" while in chair. Patient taken to bed and stated symptoms subsided. Will continue to monitor.

## 2018-08-11 NOTE — Progress Notes (Addendum)
Inpatient Diabetes Program Recommendations  AACE/ADA: New Consensus Statement on Inpatient Glycemic Control (2015)  Target Ranges:  Prepandial:   less than 140 mg/dL      Peak postprandial:   less than 180 mg/dL (1-2 hours)      Critically ill patients:  140 - 180 mg/dL   Results for ANUAR, WALGREN (MRN [id number]) as of 08/11/2018 08:26  Ref. Range 08/10/2018 07:33 08/10/2018 11:37 08/10/2018 13:16 08/10/2018 17:13 08/10/2018 21:43  Glucose-Capillary Latest Ref Range: 70 - 99 mg/dL 329 (H)  9 units NOVOLOG  169 (H)  2 units NOVOLOG +  60 units NPH Insulin  276 (H) 299 (H)  5 units NOVOLOG  271 (H)  3 units NOVOLOG    Results for TIJUAN, DANTES (MRN [id number]) as of 08/11/2018 08:26  Ref. Range 08/11/2018 08:03  Glucose-Capillary Latest Ref Range: 70 - 99 mg/dL 166 (H)   Results for ABDIFATAH, COLQUHOUN (MRN [id number]) as of 08/11/2018 08:26  Ref. Range 08/16/2017 08:05 12/30/2017 08:46 08/06/2018 08:27 08/09/2018 04:28  Hemoglobin A1C Latest Ref Range: 4.8 - 5.6 % 7.5 (H) 8.8 (H) 10.1 (A) 9.6 (H)  (228 mg/dl)    Admit with: Code Stroke  History: DM  Home DM Meds: NPH 110 units QAM       Metformin 1000 mg BID       Ozempic 1 mg Qweek  Current Orders: NPH 110 units QAM      Novolog Resistant Correction Scale/ SSI (0-20 units) TID AC + HS      Metformin 1000 mg BID    Endocrinologist: Dr. Renato Shin with [facility]--Last seen 08/06/2018.  No changes made to pt's Insulin regimen at that visit.  Patient was strongly encouraged by Dr. Loanne Drilling to never miss insulin doses.      Note NPH 60 units Daily started yesterday AM.  CBG down to 166 mg/dl this AM.   MD- Please consider the following in-hospital insulin adjustments:  1. Change NPH Insulin to 40 units BID (breakfast and bedtime)  This would be 75% total home dose of NPH Insulin.  Likely needs BID dosing of basal insulin as NPH does not last 24 hours.  If 110 units already given this AM, can start the 40 units BID dosing tomorrow AM  (08/12/2018).   2. Start Novolog Meal Coverage: Novolog 6 units TID with meals  (Please add the following Hold Parameters: Hold if pt eats <50% of meal, Hold if pt NPO)     Addendum 12pm- Met with pt today (wife and daughters at bedside).  Pt told me he has been taking NPH insulin 110 units Daily for about 2 years.  Started taking the NPH Insulin b/c his insurance would not cover Tresiba insulin and pt told me his ENDO decided to put him on the NPH Insulin instead.  Also takes Ozempic once weekly and Metformin BID.  Pt told me that he used a VGO disposable insulin pump (2 pods at time), however, pt had dangerously low CBGs at home and pt was taken off the Vgo pumps and put on the NPH Insulin.  Pt stated to me that someone told him that NPH does not last 24 hours.  Dicussed with pt and family that NPH Insulin is usually dosed BID and that taking one large shot of NPH is an unusual regimen.  Pt told me that his wife draws up the NPH insulin for him and he administers the shots.  Admits to missing many doses of the NPH over  the last several months but wife and pt stated that he has only missed a total of 4 doses in the last 30 days.    Discussed with pt and family about the importance of good CBG control since pt has now had a stroke.  Pt and wife state they check pt's CBGs at least 3-4 times per day but they don't know if Dr. Loanne Drilling actually ever looks at the [facility] data.  Pt stated he was frustrated with his care as an outpatient.  Stated he would be interested in finding new ENDO at time of d/c.    Reviewed with pt the importance of rotation of insulin injection sites.  Pt asked me what type and how much insulin he should take at home.  I explained to pt that I am not allowed to prescribe medication and that he should follow up with his PCP and ENDO.  However, I did discuss with pt that he may likely need an adjusted regimen to include two shots of NPH insulin in a 24 hour period along with a  rapid-acting insulin with meals.      --Will follow patient during hospitalization--  Wyn Quaker RN, MSN, CDE Diabetes Coordinator Inpatient Glycemic Control Team Team Pager: [phone] (8a-5p)

## 2018-08-11 NOTE — Discharge Summary (Addendum)
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 Active Problems:   Stroke [facility])  Past Medical History:  Diagnosis Date  . ADHD (attention deficit hyperactivity disorder)   . Diabetes mellitus   . Hyperlipidemia   . Hypertension    Past Surgical History:  Procedure Laterality Date  . NECK SURGERY    . SPINE SURGERY     Cervical spine x 2; Hardin Negus; [employer].    HOSPITAL MEDICATIONS .  stroke: mapping our early stages of recovery book   Does not apply Once  . aspirin EC  81 mg Oral Daily  . clopidogrel  75 mg Oral Daily  . insulin aspart  0-20 Units Subcutaneous TID WC  . insulin aspart  0-5 Units Subcutaneous QHS  . insulin NPH Human  110 Units Subcutaneous QAC breakfast  . metFORMIN  1,000 mg Oral BID WC  . pantoprazole  40 mg Oral QHS  . rosuvastatin  40 mg Oral Daily    HOME MEDICATIONS PRIOR TO ADMISSION Medications Prior to Admission  Medication Sig Dispense Refill  . aspirin EC 81 MG tablet Take 1 tablet (81 mg total) by mouth daily. 365 tablet 0  . calcium carbonate (TUMS EX) 750 MG chewable tablet Chew 2 tablets by mouth as needed for heartburn.    . insulin NPH Human (NOVOLIN N) 100 UNIT/ML injection Inject 1.1 mLs (110 Units total) every morning into the skin. And syringes 2/day 110 mL 11  . lisinopril (PRINIVIL,ZESTRIL) 10 MG tablet TAKE 1 TABLET(10 MG) BY MOUTH DAILY 90 tablet 1  . metFORMIN (GLUCOPHAGE) 1000 MG tablet Take 1 tablet (1,000 mg total) by mouth 2 (two) times daily with a meal. 180 tablet 1  . metoCLOPramide (REGLAN) 5 MG tablet Take 1 tablet (5 mg total) by mouth 3 (three) times daily before meals. 60 tablet 0  . omeprazole (PRILOSEC) 40 MG capsule Take 1 capsule  (40 mg total) by mouth daily. 30 minutes before breakfast 30 capsule 3  . rosuvastatin (CRESTOR) 20 MG tablet Take 1 tablet (20 mg total) by mouth daily. 90 tablet 3  . Semaglutide (OZEMPIC, 1 MG/DOSE, [place]) Inject 1 mg into the skin once a week. On Sunday    . sucralfate (CARAFATE) 1 g tablet Take 1 tablet (1 g total) by mouth 4 (four) times daily -  with meals and at bedtime. 60 tablet 0  . glucose blood (ONETOUCH VERIO) test strip 1 each 2 (two) times daily by Other route. And lancets 2/day 100 each 12  . Insulin Syringe-Needle U-100 (B-D INS SYR ULTRAFINE 1CC/31G) 31G X 5/16" 1 ML MISC Used to inject insulin twice daily. 90 each 2    LABORATORY STUDIES CBC    Component Value Date/Time   WBC 7.9 08/11/2018 0549   RBC 4.92 08/11/2018 0549   HGB 13.9 08/11/2018 0549   HGB 15.9 07/29/2018 0947   HCT 40.9 08/11/2018 0549   HCT 46.4 07/29/2018 0947   PLT 216 08/11/2018 0549   PLT 294 07/29/2018 0947   MCV 83.1 08/11/2018 0549   MCV 84 07/29/2018 0947   MCH 28.3 08/11/2018 0549   MCHC 34.0 08/11/2018 0549   RDW 12.5 08/11/2018 0549  RDW 13.2 07/29/2018 0947   LYMPHSABS 0.9 08/08/2018 0857   LYMPHSABS 1.9 07/29/2018 0947   MONOABS 0.5 08/08/2018 0857   EOSABS 0.1 08/08/2018 0857   EOSABS 0.5 (H) 07/29/2018 0947   BASOSABS 0.0 08/08/2018 0857   BASOSABS 0.1 07/29/2018 0947   CMP    Component Value Date/Time   NA 135 08/11/2018 0549   NA 139 07/29/2018 0947   K 3.8 08/11/2018 0549   CL 104 08/11/2018 0549   CO2 21 (L) 08/11/2018 0549   GLUCOSE 185 (H) 08/11/2018 0549   BUN 15 08/11/2018 0549   BUN 13 07/29/2018 0947   CREATININE 1.27 (H) 08/11/2018 0549   CREATININE 1.30 (H) 08/17/2015 1557   CALCIUM 9.0 08/11/2018 0549   PROT 7.5 08/08/2018 0857   PROT 7.3 07/29/2018 0947   ALBUMIN 4.1 08/08/2018 0857   ALBUMIN 4.6 07/29/2018 0947   AST 23 08/08/2018 0857   ALT 29 08/08/2018 0857   ALKPHOS 68 08/08/2018 0857   BILITOT 0.8 08/08/2018 0857   BILITOT 0.4 07/29/2018 0947    GFRNONAA 57 (L) 08/11/2018 0549   GFRNONAA 57 (L) 11/22/2013 1730   GFRAA >60 08/11/2018 0549   GFRAA 66 11/22/2013 1730   COAGS Lab Results  Component Value Date   INR 1.0 08/08/2018   Lipid Panel    Component Value Date/Time   CHOL 221 (H) 08/09/2018 0428   CHOL 226 (H) 12/30/2017 0846   TRIG 124 08/09/2018 0428   HDL 35 (L) 08/09/2018 0428   HDL 40 12/30/2017 0846   CHOLHDL 6.3 08/09/2018 0428   VLDL 25 08/09/2018 0428   LDLCALC 161 (H) 08/09/2018 0428   LDLCALC 150 (H) 12/30/2017 0846   HgbA1C  Lab Results  Component Value Date   HGBA1C 9.6 (H) 08/09/2018   Urinalysis    Component Value Date/Time   BILIRUBINUR negative 07/29/2018 0930   BILIRUBINUR negative 01/16/2017 1553   KETONESUR negative 07/29/2018 0930   PROTEINUR negative 07/29/2018 0930   PROTEINUR negative 01/16/2017 1553   UROBILINOGEN 0.2 07/29/2018 0930   NITRITE Negative 07/29/2018 0930   NITRITE negative 01/16/2017 1553   LEUKOCYTESUR Negative 07/29/2018 0930   Urine Drug Screen No results found for: LABOPIA, COCAINSCRNUR, LABBENZ, AMPHETMU, THCU, LABBARB  Alcohol Level No results found for: [facility]   SIGNIFICANT DIAGNOSTIC STUDIES  Ct Angio Head W Or Wo Contrast Ct Angio Neck W Or Wo Contrast 08/08/2018 IMPRESSION:  1. Positive for Left Vertebral Artery V4 occlusion. The Right vertebral artery and basilar remain normal.  2. Superimposed moderate left vertebral artery origin stenosis appears related to soft plaque.  3. Heavily calcified ICA siphons with moderate Left and moderate to severe Right siphon stenosis. No carotid stenosis in the neck.  4. Moderate Left MCA M2 origin stenosis.  5. Lower cervical ACDF with pseudoarthrosis and hardware loosening at C6-C7.   Ct Head Code Stroke Wo Contrast 08/08/2018 IMPRESSION:  1. No acute finding  2. Atrophy and chronic small vessel ischemia that has progressed from 2016.   MRI Brain WO Contrast 08/10/2018 IMPRESSION: 1. No acute infarct. 2.  Chronic small vessel ischemia and volume loss.   MRI Brain WO Contrast (Repeat) 08/09/2018 IMPRESSION: 1. 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 2. Otherwise stable atrophy and white matter disease.   Transthoracic Echocardiogram  08/08/2018 IMPRESSIONS 1. The left ventricle has normal systolic function with an ejection fraction of 60-65%. The cavity size was normal. Left ventricular diastolic parameters were normal. 2. The right ventricle  has normal systolic function. The cavity was normal. There is no increase in right ventricular wall thickness. 3. Left atrial size was mildly dilated. 4. The mitral valve is degenerative. Mild thickening of the mitral valve leaflet. 5. The tricuspid valve is normal in structure. 6. The aortic valve is tricuspid Moderate thickening of the aortic valve Moderate calcification of the aortic valve. 7. The pulmonic valve was normal in structure. 8. The interatrial septum was not well visualized.   HISTORY OF PRESENT ILLNESS (From Admission H&P by Dr Aroor on 08/08/2018) Jeff [name] is an 68 y.o. male  With PMH of HTN, DM, HLD who presented to [facility] ED via EMS with c/o of dizziness and right facial numbness as well as some problems walking. Code stroke initiated in ED.   Per patient last night at the [facility] lodge he had a sudden onset of dizziness where he felt the room was spinning. His symptoms completely resolved and when he woke up this morning he was symptom free. The symptoms returned about 0745 this morning about 15 minutes after he got up to take care of his animals. His wife reported some facial droop and word finding difficulty.  She called EMS.  Patient denies any anticoagulation, or bleeding. He is being treated for ulcer, but denies in dark colored stools or rectal bleeding. No prior stroke history noted. Denies any CP, SOB or a. Fib, Ha, vision changes.   ED course:  CTH: no hemorrhage CTA:  left vertebral occlusion CBG: 274 BP: 148/105 Wt: 87.4Kg Date last known well: 08/08/2018 Time last known well: 0745 tPA Given: Yes; started @ 0927 Modified Rankin: Rankin Score=0 NIHSS:4    HOSPITAL COURSE [name] is a 68 y.o. male with history of HTN, ADHD, DM, HLD and currently being treated for an ulcer presenting with dizziness, gait difficulties, right facial numbness, facial droop and word finding difficulties. tPA Given @ 0927 08/08/2018.  Left cerebellum DWI negative infarct due to left VA occlusion s/p tPA, likely due to left VA dissection versus atherosclerosis given risk factors and ICA siphon atherosclerosis  Resultant left heel-to-shin mild dysmetria  CT head - No acute finding   MRI head 08/09/2018 - No acute infarct  CTA H&N - Left Vertebral Artery V4 occlusion. Heavily calcified ICA siphons with moderate Left and moderate to severe Right siphon stenosis.  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  2D Echo  - EF 60 - 65%. No cardiac source of emboli identified.   LDL - 161  HgbA1c - 9.6  UDS - still pending  VTE prophylaxis - SCDs  Diet - Heart healthy / carb modified with thin liquids.  aspirin 81 mg daily prior to admission, now on aspirin 81 mg daily and clopidogrel 75 mg daily.  Continue DAPT for 3 weeks and then Plavix alone.  Patient counseled to be compliant with his antithrombotic medications  Ongoing aggressive stroke risk factor management  Therapy recommendations:  CIR (awaiting insurance approval)  Disposition:  Pending  Left VA occlusion  CTA head and neck showed left V4 occlusion  Exam also showed left mild dysmetria  Concerning for left cerebellum DWI negative infarct  Unclear etiology, dissection versus atherosclerosis given risk factors and ICA siphon atherosclerosis  On DAPT, continue DAPT for 3 weeks and then Plavix alone  Hypertension  Stable  Long-term  BP goal normotensive  Hyperlipidemia  Lipid lowering medication PTA:  Crestor 20 mg daily  LDL 161,  goal < 70  Current lipid lowering medication: Crestor 40 mg daily  Continue statin at discharge  Diabetes  HgbA1c 9.6, goal < 7.0  Uncontrolled  Hyperglycemia  Resume home insulin 110 U Qam  Resume home metformin  SSI resistant protocol  CBG monitoring  Close PCP follow-up and DM education  Glucose better today  Diabetic nurse coordinator met with patient 08/11/2018 and made suggestions regarding insulin dosing.  Other Stroke Risk Factors  Advanced age   Other Active Problems  Lower cervical ACDF with pseudoarthrosis and hardware loosening at C6-C7.   CKD stage II, creatinine 1.50->1.32->1.27  PUD  ADHD    DISCHARGE EXAM Blood pressure 126/85, pulse 91, temperature 97.7 F (36.5 C), temperature source Oral, resp. rate 15, height 5' 8" (1.727 m), weight 87.4 kg, SpO2 99 %.   General - Well nourished, well developed, in no apparent distress.  Ophthalmologic - fundi not visualized due to noncooperation.  Cardiovascular - Regular rate and rhythm.  Mental Status -  Level of arousal and orientation to time, place, and person were intact. Language including expression, naming, repetition, comprehension was assessed and found intact. Fund of Knowledge was assessed and was intact.  Cranial Nerves II - XII - II - Visual field intact OU. III, IV, VI - Extraocular movements intact. V - Facial sensation intact bilaterally. VII - Facial movement intact bilaterally. VIII - Hearing & vestibular intact bilaterally. X - Palate elevates symmetrically. XI - Chin turning & shoulder shrug intact bilaterally. XII - Tongue protrusion intact.  Motor Strength - The patient's strength was normal in all extremities and pronator drift was absent.  Bulk was normal and fasciculations were absent.   Motor Tone - Muscle tone was assessed at the neck and appendages  and was normal.  Reflexes - The patient's reflexes were symmetrical in all extremities and he had no pathological reflexes.  Sensory - Light touch, temperature/pinprick were assessed and were symmetrical.    Coordination - The patient had normal movements in the right hand and foot with no ataxia or dysmetria.  Left finger-to-nose and heel-to-shin dysmetria.  Tremor was absent.  Gait and Station - deferred.   Discharge Diet   Diet Order            Diet Carb Modified Fluid consistency: Thin; Room service appropriate? Yes  Diet effective now             liquids  DISCHARGE PLAN  Disposition:  Transfer to [place] for ongoing PT, OT and ST  Continue aspirin 81 mg daily with Plavix 75 mg daily for 3 weeks and then Plavix alone for secondary stroke prevention.  Recommend ongoing risk factor control by Primary Care Physician at time of discharge from inpatient rehabilitation.  Follow-up Shawnee Knapp, MD in 2 weeks following discharge from rehab.  Follow-up in [facility] in 4 weeks following discharge from rehab, office to schedule an appointment.   30 minutes were spent preparing discharge.  Laurey Morale, MSN, NP-C [facility] [phone]  ATTENDING NOTE: I reviewed above note and agree with the assessment and plan.   No acute event overnight.  Neuro stable.  No more lockjaw episodes.  Still has left sided mild ataxia.  Ready for CIR discharge.  Continue DAPT for 3 weeks and then Plavix alone.  Follow-up with stroke clinic at [facility] in 4 weeks.  Rosalin Hawking, MD PhD Stroke Neurology 08/12/2018 7:28 PM

## 2018-08-11 NOTE — Progress Notes (Signed)
I was called by the patient's night RN because the family wanted to speak regarding 2 episodes of "locked jaw" that the patient had. I spoke in detail with the patient.  He complains of having had difficulty talking for a few minutes after he started feeling an abnormal sensation all through his head. He also complains of being overtly emotional, he is not an emotional person baseline.  I reviewed his imaging.  Shows a small punctate left medullary infarct.  His neurological exam is unchanged from the prior documented and reassuring.  I think at this time, his symptoms are paresthesias related to the acute punctate infarct.  His psychological symptoms might be consistent with pseudobulbar affect which can be seen with strokes, but I do not have any new emergent or acute intervention at this time.  He should follow-up with outpatient neurology as recommended by the stroke team.  I have spoken to the patient, family at bedside and answered all their questions in detail.   Please call with questions   -- Milon Dikes, MD [facility] Pager: [phone] If 7pm to 7am, please call on call as listed on AMION.

## 2018-08-11 NOTE — Progress Notes (Signed)
STROKE TEAM PROGRESS NOTE   SUBJECTIVE (INTERVAL HISTORY) His RN, wife and daughter are at the bedside.  Patient stated that he had 2 episode of jaw locking, not able to move jaw, short lasting.  After episodes, he felt painful at both jaw joints.  As per daughter and wife, before admission, he started have a dizziness and not feeling well, he sit down, wife calling EMS, with EMS instructions, wife was checking stroke symptoms with patient, at that time patient also had episode of not able to move his jaw.  I told him that not able to move jaw for short period time does not consistent with stroke symptoms, likely related to TMJ dysfunction or anxiety/stress related.  OBJECTIVE Vitals:   08/11/18 1110 08/11/18 1355 08/11/18 1403 08/11/18 1535  BP: 120/86 118/71 107/72 126/85  Pulse:  80 88 91  Resp: 18 20 (!) 22 15  Temp:  97.7 F (36.5 C)  97.7 F (36.5 C)  TempSrc:  Oral  Oral  SpO2: 98% 100% 97% 99%  Weight:      Height:        CBC:  Recent Labs  Lab 08/08/18 0857 08/10/18 1533 08/11/18 0549  WBC 6.1 6.8 7.9  NEUTROABS 4.6  --   --   HGB 14.9 14.2 13.9  HCT 45.5 41.2 40.9  MCV 86.0 84.1 83.1  PLT 231 230 216    Basic Metabolic Panel:  Recent Labs  Lab 08/10/18 1533 08/11/18 0549  NA 135 135  K 4.5 3.8  CL 103 104  CO2 23 21*  GLUCOSE 282* 185*  BUN 16 15  CREATININE 1.32* 1.27*  CALCIUM 9.3 9.0    Lipid Panel:     Component Value Date/Time   CHOL 221 (H) 08/09/2018 0428   CHOL 226 (H) 12/30/2017 0846   TRIG 124 08/09/2018 0428   HDL 35 (L) 08/09/2018 0428   HDL 40 12/30/2017 0846   CHOLHDL 6.3 08/09/2018 0428   VLDL 25 08/09/2018 0428   LDLCALC 161 (H) 08/09/2018 0428   LDLCALC 150 (H) 12/30/2017 0846   HgbA1c:  Lab Results  Component Value Date   HGBA1C 9.6 (H) 08/09/2018   Urine Drug Screen: No results found for: LABOPIA, COCAINSCRNUR, LABBENZ, AMPHETMU, THCU, LABBARB  Alcohol Level No results found for: ETH  IMAGING  Ct Angio Head W Or Wo  Contrast Ct Angio Neck W Or Wo Contrast 08/08/2018 IMPRESSION:  1. Positive for Left Vertebral Artery V4 occlusion. The Right vertebral artery and basilar remain normal.  2. Superimposed moderate left vertebral artery origin stenosis appears related to soft plaque.  3. Heavily calcified ICA siphons with moderate Left and moderate to severe Right siphon stenosis. No carotid stenosis in the neck.  4. Moderate Left MCA M2 origin stenosis.  5. Lower cervical ACDF with pseudoarthrosis and hardware loosening at C6-C7.   Ct Head Code Stroke Wo Contrast 08/08/2018 IMPRESSION:  1. No acute finding  2. Atrophy and chronic small vessel ischemia that has progressed from 2016.   MRI Brain WO Contrast 08/10/2018 IMPRESSION: 1. No acute infarct. 2. Chronic small vessel ischemia and volume loss.   MRI Brain WO Contrast (Repeat) 08/09/2018 IMPRESSION: 1. 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 2. Otherwise stable atrophy and white matter disease.   Transthoracic Echocardiogram  08/08/2018 IMPRESSIONS  1. The left ventricle has normal systolic function with an ejection fraction of 60-65%. The cavity size was normal. Left ventricular diastolic parameters were normal.  2. The  right ventricle has normal systolic function. The cavity was normal. There is no increase in right ventricular wall thickness.  3. Left atrial size was mildly dilated.  4. The mitral valve is degenerative. Mild thickening of the mitral valve leaflet.  5. The tricuspid valve is normal in structure.  6. The aortic valve is tricuspid Moderate thickening of the aortic valve Moderate calcification of the aortic valve.  7. The pulmonic valve was normal in structure.  8. The interatrial septum was not well visualized.    PHYSICAL EXAM  Temp:  [97.6 F (36.4 C)-98.3 F (36.8 C)] 97.7 F (36.5 C) (03/09 1535) Pulse Rate:  [73-91] 91 (03/09 1535) Resp:  [15-22] 15 (03/09 1535) BP:  (107-168)/(69-140) 126/85 (03/09 1535) SpO2:  [97 %-100 %] 99 % (03/09 1535)  General - Well nourished, well developed, in no apparent distress.  Ophthalmologic - fundi not visualized due to noncooperation.  Cardiovascular - Regular rate and rhythm.  Mental Status -  Level of arousal and orientation to time, place, and person were intact. Language including expression, naming, repetition, comprehension was assessed and found intact. Fund of Knowledge was assessed and was intact.  Cranial Nerves II - XII - II - Visual field intact OU. III, IV, VI - Extraocular movements intact. V - Facial sensation intact bilaterally. VII - Facial movement intact bilaterally. VIII - Hearing & vestibular intact bilaterally. X - Palate elevates symmetrically. XI - Chin turning & shoulder shrug intact bilaterally. XII - Tongue protrusion intact.  Motor Strength - The patient's strength was normal in all extremities and pronator drift was absent.  Bulk was normal and fasciculations were absent.   Motor Tone - Muscle tone was assessed at the neck and appendages and was normal.  Reflexes - The patient's reflexes were symmetrical in all extremities and he had no pathological reflexes.  Sensory - Light touch, temperature/pinprick were assessed and were symmetrical.    Coordination - The patient had normal movements in the right hand and foot with no ataxia or dysmetria.  Left finger-to-nose dysmetria and heel-to-shin ataxia.  Tremor was absent.  Gait and Station - deferred.    ASSESSMENT/PLAN [name] is a 68 y.o. male with history of HTN, ADHD, DM, HLD and currently being treated for an ulcer presenting with dizziness, gait difficulties, right facial numbness, facial droop and word finding difficulties. tPA Given @ 0927 08/08/2018.  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 Heavily calcified ICA siphons with moderate Left and moderate to severe Right siphon stenosis.  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  2D Echo  - EF 60 - 65%. No cardiac source of emboli identified.   LDL - 161  HgbA1c - 9.6  UDS - still pending  VTE prophylaxis - SCDs  Diet - Heart healthy / carb modified with thin liquids.  aspirin 81 mg daily prior to admission, now on aspirin 81 mg daily and clopidogrel 75 mg daily.  Continue DAPT for 3 weeks and then Plavix alone.  Patient counseled to be compliant with his antithrombotic medications  Ongoing aggressive stroke risk factor management  Therapy recommendations:  CIR (awaiting insurance approval)  Disposition:  Pending  Left VA occlusion  CTA head and neck showed left V4 occlusion  Exam also showed left mild dysmetria  Concerning for left cerebellum DWI negative infarct  Unclear etiology, dissection versus atherosclerosis given risk factors and ICA siphon atherosclerosis  On DAPT, continue DAPT for 3 weeks and then Plavix alone  Hypertension  Stable . Long-term BP goal normotensive  Hyperlipidemia  Lipid lowering medication PTA:  Crestor 20 mg daily  LDL 161, goal < 70  Current lipid lowering medication: Crestor 40 mg daily  Continue statin at discharge  Diabetes  HgbA1c 9.6, goal < 7.0  Uncontrolled  Hyperglycemia  Diabetic nurse coordinator recommended insulin changes.  Now on NPH 40 units twice daily  Add NovoLog 6 units q. AC  Continue home metformin  SSI resistant protocol  CBG monitoring  Close PCP follow-up and DM education  Episodes of jaw locking  Not consistent with stroke  Could be due to TMJ dysfunction versus  stress/anxiety  Recommend outpatient dental follow-up  Relaxation  Other Stroke Risk Factors  Advanced age  Other Active Problems  Lower cervical ACDF with pseudoarthrosis and hardware loosening at C6-C7.   CKD stage II, creatinine 1.50->1.32->1.27  PUD  ADHD  Hospital day #3  Marvel Plan, MD PhD Stroke Neurology 08/11/2018 7:06 PM  I spent  35 minutes in total face-to-face time with the patient, more than 50% of which was spent in counseling and coordination of care, reviewing test results, images and medication, and discussing the diagnosis of left lateral medullary infarct, episode of jaw locking, treatment plan and potential prognosis. This patient's care requiresreview of multiple databases, neurological assessment, discussion with family, other specialists and medical decision making of high complexity. I had long discussion with Jaw locking episodes at bedside, updated pt current condition, treatment plan and potential prognosis.     To contact Stroke Continuity provider, please refer to [email or web address]. After hours, contact General Neurology

## 2018-08-11 NOTE — Progress Notes (Signed)
Patient requested to be seen by another neurologist for a second opinion regarding two "episodes" patient had earlier today where he had loss control of his jaw abruptly. Patient was unable to open or close his mouth during that time. Each episode lasted less than 5 minutes and patient would go back to his baseline.   Dr. Roda Shutters notified through text page and MD instructed nurse to call the on-call neurologist. Dr. Georg Ruddle was notified and instructed nurse to make the patient aware that because his case is no longer considered acute, he would have to seek a second opinion outpatient.   Information passed onto patient and his family. Jami, RN was at bedside and stated she would follow up.

## 2018-08-11 NOTE — Discharge Instructions (Signed)
Local Endocrinologists:  [facility] [phone]) 1. Dr. Carlus Pavlov 2. Dr. Reather Littler 3. Dr. Romero Belling  [facility] [phone]) 1. Dr. Dorisann Frames 2. Dr. Georgiann Mccoy [facility] [phone]) [[place] office]  1. Dr. Efraim Kaufmann [place] 2. Dr. Gelene Mink  [facility]) [phone]) 1. Autumn Hudnall Yetta Barre), [place] 2. Dr. Izell Okemah 3. Dr. Jillyn Ledger.   Fingerstick glucose (sugar) goals for home: Before meals: 80-130 mg/dl 2-Hours after meals: less than 180 mg/dl Hemoglobin A1K goal: 7% or less

## 2018-08-11 NOTE — Evaluation (Signed)
Occupational Therapy Evaluation Patient Details Name: Jeff [name] MRN: [id number] DOB: 01-Nov-1950 Today's Date: 08/11/2018    History of Present Illness Jeff [name] is an 68 y.o. male  who presented with dizziness, right facial numbness and difficulty walking. Recieved tPa in ED. MRI no acute infarct, chronic small vessel ischemia and volume loss. Repeat Brain MRI 3/8-new infarct posterolateral medulla. With PMH of HTN, DM, HLD.    Clinical Impression   Pt admitted with above problem list and new infarct as of 3/8. Pt now presenting with mild L UE/LE hemi, ataxia, and balance deficits. Pt is requiring mod A to maintain dynamic standing balance with frequent LOB to L. Mod A overall to don LB clothing. Pt is a great candidate for CIR as he is motivated to return to PLOF and has great family support. OT will continue to follow acutely.     Follow Up Recommendations  CIR    Equipment Recommendations  None recommended by OT    Recommendations for Other Services PT consult;Rehab consult;Speech consult     Precautions / Restrictions Precautions Precautions: Fall Restrictions Weight Bearing Restrictions: No      Mobility Bed Mobility Overal bed mobility: Needs Assistance Bed Mobility: Supine to Sit;Sit to Supine     Supine to sit: Supervision;HOB elevated Sit to supine: Supervision;HOB elevated   General bed mobility comments: use of bed features  Transfers Overall transfer level: Needs assistance Equipment used: Rolling walker (2 wheeled) Transfers: Sit to/from [employer] Sit to Stand: Min assist Stand pivot transfers: Mod assist       General transfer comment: Min A to power up, attempted to not use RW at first, with multiple heavy L LOB and L lean. With RW and cueing for safety/sequencing, pt able to complete ~10 ft functional mobility with mod A.     Balance Overall balance assessment: Needs assistance Sitting-balance support: Feet supported;No upper  extremity supported Sitting balance-Leahy Scale: Fair   Postural control: Left lateral lean Standing balance support: During functional activity Standing balance-Leahy Scale: Poor Standing balance comment: Pt with left latera lean in standing- needs cues to find midline. Limited by dizziness.                            ADL either performed or assessed with clinical judgement   ADL Overall ADL's : Needs assistance/impaired Eating/Feeding: Supervision/ safety;Sitting   Grooming: Oral care;Wash/dry face;Wash/dry hands;Sitting;Supervision/safety   Upper Body Bathing: Sitting;Supervision/ safety   Lower Body Bathing: Sit to/from stand;Moderate assistance   Upper Body Dressing : Sitting;Min guard   Lower Body Dressing: Sit to/from stand;Moderate assistance   Toilet Transfer: Moderate assistance;RW;Regular Toilet;Ambulation   Toileting- Clothing Manipulation and Hygiene: Sit to/from stand;Moderate assistance       Functional mobility during ADLs: Moderate assistance;Rolling walker;Cueing for safety;Cueing for sequencing General ADL Comments: Pt with very ataxic gait, slight dysmetria of L UE      Vision Baseline Vision/History: No visual deficits Patient Visual Report: No change from baseline Vision Assessment?: No apparent visual deficits     Perception Perception Perception Tested?: Yes Perception Deficits: Inattention/neglect Inattention/Neglect: Impaired- to be further tested in functional context Spatial deficits: requires cues for L attention   Praxis Praxis Praxis tested?: Within functional limits    Pertinent Vitals/Pain Pain Assessment: No/denies pain     Hand Dominance Right   Extremity/Trunk Assessment Upper Extremity Assessment Upper Extremity Assessment: LUE deficits/detail LUE Deficits / Details: 4-/5 overall LUE  Sensation: decreased light touch;history of peripheral neuropathy LUE Coordination: decreased fine motor   Lower Extremity  Assessment Lower Extremity Assessment: Defer to PT evaluation   Cervical / Trunk Assessment Cervical / Trunk Assessment: Normal   Communication Communication Communication: No difficulties   Cognition Arousal/Alertness: Awake/alert Behavior During Therapy: WFL for tasks assessed/performed Overall Cognitive Status: Within Functional Limits for tasks assessed                                 General Comments: Some safety awareness deficits    General Comments  Wife and granddaughter present, very supportive and interested in CIR referral             Home Living Family/patient expects to be discharged to:: Private residence Living Arrangements: Spouse/significant other Available Help at Discharge: Family;Available 24 hours/day Type of Home: House Home Access: Level entry     Home Layout: One level     Bathroom Shower/Tub: Chief Strategy Officer: Standard Bathroom Accessibility: Yes How Accessible: Accessible via wheelchair;Accessible via walker Home Equipment: Walker - 2 wheels;Bedside commode;Shower seat;Crutches;Wheelchair - manual;Wheelchair - power   Additional Comments: Pt normally uses tub shower but in back of house has ramp and fully accessible apartment with 40 in doorways, roll in shower  Lives With: Spouse    Prior Functioning/Environment Level of Independence: Independent        Comments: Not driving d/t neuropathy        OT Problem List: Decreased strength;Decreased range of motion;Decreased activity tolerance;Impaired balance (sitting and/or standing);Decreased coordination;Decreased safety awareness;Decreased knowledge of use of DME or AE;Decreased knowledge of precautions;Impaired UE functional use;Impaired sensation;Cardiopulmonary status limiting activity      OT Treatment/Interventions: Self-care/ADL training;Neuromuscular education;Therapeutic exercise;Energy conservation;DME and/or AE instruction;Modalities;Manual  therapy;Therapeutic activities;Splinting;Patient/family education;Balance training    OT Goals(Current goals can be found in the care plan section) Acute Rehab OT Goals Patient Stated Goal: go to rehab OT Goal Formulation: With patient/family Time For Goal Achievement: 08/21/18 Potential to Achieve Goals: Good  OT Frequency: Min 3X/week    AM-PAC OT "6 Clicks" Daily Activity     Outcome Measure Help from another person eating meals?: None Help from another person taking care of personal grooming?: A Little Help from another person toileting, which includes using toliet, bedpan, or urinal?: A Lot Help from another person bathing (including washing, rinsing, drying)?: A Lot Help from another person to put on and taking off regular upper body clothing?: A Little Help from another person to put on and taking off regular lower body clothing?: A Lot 6 Click Score: 16   End of Session Equipment Utilized During Treatment: Gait belt;Rolling walker  Activity Tolerance: Patient tolerated treatment well Patient left: in chair;with family/visitor present;with call bell/phone within reach  OT Visit Diagnosis: Unsteadiness on feet (R26.81);Muscle weakness (generalized) (M62.81);Hemiplegia and hemiparesis;History of falling (Z91.81) Hemiplegia - Right/Left: Left Hemiplegia - dominant/non-dominant: Non-Dominant Hemiplegia - caused by: Cerebral infarction                Time: 1455-1515 OT Time Calculation (min): 20 min Charges:  OT General Charges $OT Visit: 1 Visit OT Evaluation $OT Eval Moderate Complexity: 1 Mod  Crissie Reese OTR/L  08/11/2018, 3:31 PM

## 2018-08-11 NOTE — Progress Notes (Signed)
Rehab Admissions Coordinator Note:  Patient was screened by Clois Dupes for appropriateness for an Inpatient Acute Rehab Consult per PT recs..  At this time, we are recommending Inpatient Rehab consult.  Clois Dupes RN MSN 08/11/2018, 8:11 AM  I can be reached at [phone].

## 2018-08-11 NOTE — Progress Notes (Signed)
   08/11/18 1100  Vitals  Temp 98 F (36.7 C)  Temp Source Axillary  BP (!) 142/113  MAP (mmHg) 79  BP Location Left Arm  BP Method Automatic  Patient Position (if appropriate) Sitting  Pulse Rate 82  Pulse Rate Source Dinamap  Oxygen Therapy  SpO2 98 %  O2 Device Nasal Cannula    Patient had an episode he said was similar to the one he had before he was admitted for stroke. When nurse entered room, patient was unable to open or close his jaw. No other acute changes were noted. Episode lasted less than 5 minutes and patient returned to his baseline. Vitals were stable, and Dr. Roda Shutters aware. Will continue to monitor.

## 2018-08-11 NOTE — Progress Notes (Signed)
IP rehab admissions - I met with patient this am.  Awaiting OT evaluation.  I have opened the case and have requested acute inpatient rehab admission.  I will follow up after I hear back from insurance case manager.  Call me for questions.  [phone]

## 2018-08-11 NOTE — Progress Notes (Signed)
Physical Therapy Treatment Patient Details Name: Jeff [name] MRN: [id number] DOB: 03-Jun-1951 Today's Date: 08/11/2018    History of Present Illness Jeff [name] is an 68 y.o. male  who presented with dizziness, right facial numbness and difficulty walking. Recieved tPa in ED. MRI no acute infarct, chronic small vessel ischemia and volume loss. Repeat Brain MRI 3/8-new infarct posterolateral medulla. With PMH of HTN, DM, HLD.     PT Comments    Session limited today secondary to dizziness and hypotension. Tolerated standing bouts with Mod A due to ataxia, jerking movements and instability. Pt able to take a few steps in room with mod A due to above. worked on standing balance, finding midline and controlled sit to stands. Sitting BP 133/76, Standing BP 122/82, Supine post activity BP 126/79. Symptomatic with sitting upright and post transfer to chair. Symptoms resolved once in supine. Eager to improve strength and mobility and return to PLOF. Great CIR candidate. Will continue to follow and progress as tolerated.    Follow Up Recommendations  CIR     Equipment Recommendations  Other (comment)(defer)    Recommendations for Other Services       Precautions / Restrictions Precautions Precautions: Fall Restrictions Weight Bearing Restrictions: No    Mobility  Bed Mobility Overal bed mobility: Needs Assistance Bed Mobility: Supine to Sit;Sit to Supine     Supine to sit: Supervision;HOB elevated Sit to supine: Supervision;HOB elevated   General bed mobility comments: performed x2 with use of rail; + dizziness.   Transfers Overall transfer level: Needs assistance Equipment used: None Transfers: Sit to/from [employer] Sit to Stand: Min assist;Mod assist Stand pivot transfers: Mod assist       General transfer comment: Assist to steady in standing with cues for equal WB through BUEs/LEs. Ataxic with trunk in standing with LOB towards left x3. Transferred bed  to/from chair x2 with Mod A due to ataxia and sudden jerking movements.  Ambulation/Gait Ambulation/Gait assistance: Mod assist Gait Distance (Feet): 4 Feet Assistive device: None       General Gait Details: Able to take a few steps in room with mod A due to left sided lean/LOB, ataxia and sudden jerking mvoements. Limited by dizziness.    Stairs             Wheelchair Mobility    Modified Rankin (Stroke Patients Only) Modified Rankin (Stroke Patients Only) Pre-Morbid Rankin Score: No symptoms Modified Rankin: Moderately severe disability     Balance Overall balance assessment: Needs assistance Sitting-balance support: Feet supported;No upper extremity supported Sitting balance-Leahy Scale: Fair     Standing balance support: During functional activity Standing balance-Leahy Scale: Poor Standing balance comment: Pt with left latera lean in standing- needs cues to find midline. Limited by dizziness.                             Cognition Arousal/Alertness: Awake/alert Behavior During Therapy: WFL for tasks assessed/performed Overall Cognitive Status: Within Functional Limits for tasks assessed                                        Exercises      General Comments        Pertinent Vitals/Pain Pain Assessment: No/denies pain    Home Living  Prior Function            PT Goals (current goals can now be found in the care plan section) Progress towards PT goals: Not progressing toward goals - comment(secondary to dizziness and hypotension)    Frequency    Min 4X/week      PT Plan Current plan remains appropriate    Co-evaluation              AM-PAC PT "6 Clicks" Mobility   Outcome Measure  Help needed turning from your back to your side while in a flat bed without using bedrails?: None Help needed moving from lying on your back to sitting on the side of a flat bed without using  bedrails?: A Little Help needed moving to and from a bed to a chair (including a wheelchair)?: A Lot Help needed standing up from a chair using your arms (e.g., wheelchair or bedside chair)?: A Lot Help needed to walk in hospital room?: A Lot Help needed climbing 3-5 steps with a railing? : Total 6 Click Score: 14    End of Session Equipment Utilized During Treatment: Gait belt Activity Tolerance: Treatment limited secondary to medical complications (Comment)(dizziness, BP) Patient left: in bed;with call bell/phone within reach;with bed alarm set Nurse Communication: Mobility status PT Visit Diagnosis: Unsteadiness on feet (R26.81);Dizziness and giddiness (R42);Ataxic gait (R26.0)     Time: 8280-0349 PT Time Calculation (min) (ACUTE ONLY): 32 min  Charges:  $Therapeutic Activity: 23-37 mins                     [name], [place], DPT Acute Rehabilitation Services Pager [phone] Office [phone]       [name] 08/11/2018, 10:32 AM

## 2018-08-12 ENCOUNTER — Encounter (HOSPITAL_COMMUNITY): Payer: Self-pay | Admitting: *Deleted

## 2018-08-12 ENCOUNTER — Inpatient Hospital Stay (HOSPITAL_COMMUNITY)
Admission: RE | Admit: 2018-08-12 | Discharge: 2018-08-27 | DRG: 057 | Disposition: A | Discharge status: Home Health | Payer: No Typology Code available for payment source | Source: Intra-hospital | Attending: Physical Medicine & Rehabilitation | Admitting: Physical Medicine & Rehabilitation

## 2018-08-12 ENCOUNTER — Other Ambulatory Visit: Payer: Self-pay

## 2018-08-12 ENCOUNTER — Other Ambulatory Visit: Payer: Self-pay | Admitting: Neurology

## 2018-08-12 DIAGNOSIS — I63112 Cerebral infarction due to embolism of left vertebral artery: Secondary | ICD-10-CM | POA: Diagnosis not present

## 2018-08-12 DIAGNOSIS — E876 Hypokalemia: Secondary | ICD-10-CM | POA: Diagnosis present

## 2018-08-12 DIAGNOSIS — H3500 Unspecified background retinopathy: Secondary | ICD-10-CM

## 2018-08-12 DIAGNOSIS — I69354 Hemiplegia and hemiparesis following cerebral infarction affecting left non-dominant side: Secondary | ICD-10-CM | POA: Diagnosis present

## 2018-08-12 DIAGNOSIS — K3184 Gastroparesis: Secondary | ICD-10-CM | POA: Diagnosis present

## 2018-08-12 DIAGNOSIS — Z7982 Long term (current) use of aspirin: Secondary | ICD-10-CM

## 2018-08-12 DIAGNOSIS — Z8249 Family history of ischemic heart disease and other diseases of the circulatory system: Secondary | ICD-10-CM | POA: Diagnosis not present

## 2018-08-12 DIAGNOSIS — D72829 Elevated white blood cell count, unspecified: Secondary | ICD-10-CM | POA: Diagnosis present

## 2018-08-12 DIAGNOSIS — I10 Essential (primary) hypertension: Secondary | ICD-10-CM

## 2018-08-12 DIAGNOSIS — E1159 Type 2 diabetes mellitus with other circulatory complications: Secondary | ICD-10-CM | POA: Diagnosis not present

## 2018-08-12 DIAGNOSIS — E1151 Type 2 diabetes mellitus with diabetic peripheral angiopathy without gangrene: Secondary | ICD-10-CM | POA: Diagnosis present

## 2018-08-12 DIAGNOSIS — Z809 Family history of malignant neoplasm, unspecified: Secondary | ICD-10-CM

## 2018-08-12 DIAGNOSIS — N189 Chronic kidney disease, unspecified: Secondary | ICD-10-CM

## 2018-08-12 DIAGNOSIS — Z794 Long term (current) use of insulin: Secondary | ICD-10-CM

## 2018-08-12 DIAGNOSIS — E1143 Type 2 diabetes mellitus with diabetic autonomic (poly)neuropathy: Secondary | ICD-10-CM | POA: Diagnosis present

## 2018-08-12 DIAGNOSIS — Z888 Allergy status to other drugs, medicaments and biological substances status: Secondary | ICD-10-CM | POA: Diagnosis not present

## 2018-08-12 DIAGNOSIS — E0821 Diabetes mellitus due to underlying condition with diabetic nephropathy: Secondary | ICD-10-CM | POA: Diagnosis not present

## 2018-08-12 DIAGNOSIS — Z884 Allergy status to anesthetic agent status: Secondary | ICD-10-CM | POA: Diagnosis not present

## 2018-08-12 DIAGNOSIS — I129 Hypertensive chronic kidney disease with stage 1 through stage 4 chronic kidney disease, or unspecified chronic kidney disease: Secondary | ICD-10-CM | POA: Diagnosis present

## 2018-08-12 DIAGNOSIS — E785 Hyperlipidemia, unspecified: Secondary | ICD-10-CM | POA: Diagnosis present

## 2018-08-12 DIAGNOSIS — R270 Ataxia, unspecified: Secondary | ICD-10-CM

## 2018-08-12 DIAGNOSIS — E113393 Type 2 diabetes mellitus with moderate nonproliferative diabetic retinopathy without macular edema, bilateral: Secondary | ICD-10-CM | POA: Diagnosis present

## 2018-08-12 DIAGNOSIS — F909 Attention-deficit hyperactivity disorder, unspecified type: Secondary | ICD-10-CM | POA: Diagnosis present

## 2018-08-12 DIAGNOSIS — E1122 Type 2 diabetes mellitus with diabetic chronic kidney disease: Secondary | ICD-10-CM | POA: Diagnosis present

## 2018-08-12 DIAGNOSIS — I69393 Ataxia following cerebral infarction: Secondary | ICD-10-CM | POA: Diagnosis not present

## 2018-08-12 DIAGNOSIS — Z833 Family history of diabetes mellitus: Secondary | ICD-10-CM | POA: Diagnosis not present

## 2018-08-12 DIAGNOSIS — Z9189 Other specified personal risk factors, not elsewhere classified: Secondary | ICD-10-CM

## 2018-08-12 DIAGNOSIS — E1165 Type 2 diabetes mellitus with hyperglycemia: Secondary | ICD-10-CM | POA: Diagnosis not present

## 2018-08-12 DIAGNOSIS — E119 Type 2 diabetes mellitus without complications: Secondary | ICD-10-CM

## 2018-08-12 DIAGNOSIS — F419 Anxiety disorder, unspecified: Secondary | ICD-10-CM | POA: Diagnosis present

## 2018-08-12 LAB — BASIC METABOLIC PANEL
Anion gap: 9 (ref 5–15)
BUN: 15 mg/dL (ref 8–23)
CALCIUM: 9 mg/dL (ref 8.9–10.3)
CO2: 23 mmol/L (ref 22–32)
Chloride: 106 mmol/L (ref 98–111)
Creatinine, Ser: 1.25 mg/dL — ABNORMAL HIGH (ref 0.61–1.24)
GFR calc Af Amer: >60 mL/min (ref >60)
GFR calc non Af Amer: 58 mL/min — ABNORMAL LOW (ref >60)
Glucose, Bld: 108 mg/dL — ABNORMAL HIGH (ref 70–99)
Potassium: 3.4 mmol/L — ABNORMAL LOW (ref 3.5–5.1)
Sodium: 138 mmol/L (ref 135–145)

## 2018-08-12 LAB — CBC
HCT: 40.1 % (ref 39.0–52.0)
Hemoglobin: 13.8 g/dL (ref 13.0–17.0)
MCH: 28.7 pg (ref 26.0–34.0)
MCHC: 34.4 g/dL (ref 30.0–36.0)
MCV: 83.4 fL (ref 80.0–100.0)
Platelets: 245 10*3/uL (ref 150–400)
RBC: 4.81 MIL/uL (ref 4.22–5.81)
RDW: 12.7 % (ref 11.5–15.5)
WBC: 10.7 10*3/uL — ABNORMAL HIGH (ref 4.0–10.5)
nRBC: 0 % (ref 0.0–0.2)

## 2018-08-12 LAB — GLUCOSE, CAPILLARY
Glucose-Capillary: 87 mg/dL (ref 70–99)
Glucose-Capillary: 138 mg/dL — ABNORMAL HIGH (ref 70–99)
Glucose-Capillary: 110 mg/dL — ABNORMAL HIGH (ref 70–99)
Glucose-Capillary: 167 mg/dL — ABNORMAL HIGH (ref 70–99)

## 2018-08-12 MED ORDER — CLOPIDOGREL BISULFATE 75 MG PO TABS
75.0000 mg | ORAL_TABLET | Freq: Every day | ORAL | Status: DC
  Administered 2018-08-13 – 2018-08-27 (×15): 75 mg via ORAL
  Filled 2018-08-12 (×15): qty 1

## 2018-08-12 MED ORDER — PROCHLORPERAZINE 25 MG RE SUPP: 12.5000 mg | Freq: Four times a day (QID) | RECTAL | Status: DC | PRN

## 2018-08-12 MED ORDER — TRAZODONE HCL 50 MG PO TABS
25.0000 mg | ORAL_TABLET | Freq: Every evening | ORAL | Status: DC | PRN
  Filled 2018-08-12 (×2): qty 1

## 2018-08-12 MED ORDER — INSULIN NPH (HUMAN) (ISOPHANE) 100 UNIT/ML ~~LOC~~ SUSP
40.0000 [IU] | Freq: Two times a day (BID) | SUBCUTANEOUS | Status: DC
  Administered 2018-08-12 – 2018-08-13 (×2): 40 [IU] via SUBCUTANEOUS

## 2018-08-12 MED ORDER — BISACODYL 10 MG RE SUPP: 10.0000 mg | Freq: Every day | RECTAL | Status: DC | PRN

## 2018-08-12 MED ORDER — LIVING WELL WITH DIABETES BOOK: Freq: Once | Status: DC

## 2018-08-12 MED ORDER — DIPHENHYDRAMINE HCL 12.5 MG/5ML PO ELIX: 12.5000 mg | ORAL_SOLUTION | Freq: Four times a day (QID) | ORAL | Status: DC | PRN

## 2018-08-12 MED ORDER — FLEET ENEMA 7-19 GM/118ML RE ENEM: 1.0000 | ENEMA | Freq: Once | RECTAL | Status: DC | PRN

## 2018-08-12 MED ORDER — ASPIRIN EC 81 MG PO TBEC
81.0000 mg | DELAYED_RELEASE_TABLET | Freq: Every day | ORAL | Status: DC
  Administered 2018-08-13 – 2018-08-27 (×15): 81 mg via ORAL
  Filled 2018-08-12 (×15): qty 1

## 2018-08-12 MED ORDER — INSULIN ASPART 100 UNIT/ML ~~LOC~~ SOLN
0.0000 [IU] | Freq: Three times a day (TID) | SUBCUTANEOUS | Status: DC
  Administered 2018-08-13 – 2018-08-14 (×2): 7 [IU] via SUBCUTANEOUS
  Administered 2018-08-15: 4 [IU] via SUBCUTANEOUS
  Administered 2018-08-15: 7 [IU] via SUBCUTANEOUS
  Administered 2018-08-16: 4 [IU] via SUBCUTANEOUS
  Administered 2018-08-16: 7 [IU] via SUBCUTANEOUS
  Administered 2018-08-17: 4 [IU] via SUBCUTANEOUS
  Administered 2018-08-17: 3 [IU] via SUBCUTANEOUS
  Administered 2018-08-17: 4 [IU] via SUBCUTANEOUS
  Administered 2018-08-18: 3 [IU] via SUBCUTANEOUS
  Administered 2018-08-18: 4 [IU] via SUBCUTANEOUS
  Administered 2018-08-18: 7 [IU] via SUBCUTANEOUS
  Administered 2018-08-19: 3 [IU] via SUBCUTANEOUS
  Administered 2018-08-19 – 2018-08-20 (×4): 4 [IU] via SUBCUTANEOUS
  Administered 2018-08-20: 3 [IU] via SUBCUTANEOUS
  Administered 2018-08-21: 4 [IU] via SUBCUTANEOUS
  Administered 2018-08-22: 11 [IU] via SUBCUTANEOUS
  Administered 2018-08-22 – 2018-08-23 (×2): 3 [IU] via SUBCUTANEOUS
  Administered 2018-08-23: 7 [IU] via SUBCUTANEOUS
  Administered 2018-08-24 (×2): 3 [IU] via SUBCUTANEOUS
  Administered 2018-08-24 – 2018-08-26 (×4): 4 [IU] via SUBCUTANEOUS

## 2018-08-12 MED ORDER — BLOOD PRESSURE CONTROL BOOK: Freq: Once | Status: DC

## 2018-08-12 MED ORDER — CALCIUM CARBONATE ANTACID 500 MG PO CHEW: 2.0000 | CHEWABLE_TABLET | ORAL | Status: DC | PRN

## 2018-08-12 MED ORDER — ENOXAPARIN SODIUM 40 MG/0.4ML ~~LOC~~ SOLN
40.0000 mg | SUBCUTANEOUS | Status: DC
  Administered 2018-08-12 – 2018-08-26 (×15): 40 mg via SUBCUTANEOUS
  Filled 2018-08-12 (×15): qty 0.4

## 2018-08-12 MED ORDER — INSULIN ASPART 100 UNIT/ML ~~LOC~~ SOLN: 0.0000 [IU] | Freq: Three times a day (TID) | SUBCUTANEOUS | Status: DC

## 2018-08-12 MED ORDER — ACETAMINOPHEN 325 MG PO TABS
325.0000 mg | ORAL_TABLET | ORAL | Status: DC | PRN
  Administered 2018-08-23 – 2018-08-25 (×2): 650 mg via ORAL
  Filled 2018-08-12 (×3): qty 2

## 2018-08-12 MED ORDER — SENNOSIDES-DOCUSATE SODIUM 8.6-50 MG PO TABS: 1.0000 | ORAL_TABLET | Freq: Every evening | ORAL | Status: DC | PRN

## 2018-08-12 MED ORDER — PANTOPRAZOLE SODIUM 40 MG PO TBEC
40.0000 mg | DELAYED_RELEASE_TABLET | Freq: Every day | ORAL | Status: DC
  Administered 2018-08-12 – 2018-08-26 (×15): 40 mg via ORAL
  Filled 2018-08-12 (×16): qty 1

## 2018-08-12 MED ORDER — INSULIN ASPART 100 UNIT/ML ~~LOC~~ SOLN
6.0000 [IU] | Freq: Three times a day (TID) | SUBCUTANEOUS | Status: DC
  Administered 2018-08-12 – 2018-08-26 (×39): 6 [IU] via SUBCUTANEOUS

## 2018-08-12 MED ORDER — GUAIFENESIN-DM 100-10 MG/5ML PO SYRP: 5.0000 mL | ORAL_SOLUTION | Freq: Four times a day (QID) | ORAL | Status: DC | PRN

## 2018-08-12 MED ORDER — ROSUVASTATIN CALCIUM 20 MG PO TABS
40.0000 mg | ORAL_TABLET | Freq: Every day | ORAL | Status: DC
  Administered 2018-08-13 – 2018-08-27 (×15): 40 mg via ORAL
  Filled 2018-08-12 (×16): qty 2

## 2018-08-12 MED ORDER — PROCHLORPERAZINE EDISYLATE 10 MG/2ML IJ SOLN: 5.0000 mg | Freq: Four times a day (QID) | INTRAMUSCULAR | Status: DC | PRN

## 2018-08-12 MED ORDER — PROCHLORPERAZINE MALEATE 5 MG PO TABS: 5.0000 mg | ORAL_TABLET | Freq: Four times a day (QID) | ORAL | Status: DC | PRN

## 2018-08-12 MED ORDER — POLYETHYLENE GLYCOL 3350 17 G PO PACK: 17.0000 g | PACK | Freq: Every day | ORAL | Status: DC | PRN

## 2018-08-12 MED ORDER — ALUM & MAG HYDROXIDE-SIMETH 200-200-20 MG/5ML PO SUSP
30.0000 mL | ORAL | Status: DC | PRN
  Filled 2018-08-12: qty 30

## 2018-08-12 MED ORDER — INSULIN ASPART 100 UNIT/ML ~~LOC~~ SOLN: 0.0000 [IU] | Freq: Every day | SUBCUTANEOUS | Status: DC

## 2018-08-12 NOTE — Plan of Care (Signed)
  Problem: Education: Goal: Knowledge of disease or condition will improve Outcome: Progressing Goal: Knowledge of secondary prevention will improve Outcome: Progressing Goal: Knowledge of patient specific risk factors addressed and post discharge goals established will improve Outcome: Progressing   Problem: Ischemic Stroke/TIA Tissue Perfusion: Goal: Complications of ischemic stroke/TIA will be minimized Outcome: Progressing   Problem: Education: Goal: Knowledge of General Education information will improve Description Including pain rating scale, medication(s)/side effects and non-pharmacologic comfort measures Outcome: Progressing   Problem: Clinical Measurements: Goal: Ability to maintain clinical measurements within normal limits will improve Outcome: Progressing Goal: Diagnostic test results will improve Outcome: Progressing   Problem: Elimination: Goal: Will not experience complications related to bowel motility Outcome: Progressing Goal: Will not experience complications related to urinary retention Outcome: Progressing   Problem: Safety: Goal: Ability to remain free from injury will improve Outcome: Progressing

## 2018-08-12 NOTE — Progress Notes (Signed)
Report given to Stanton Kidney, RN on [facility] . Pt will transfer to room [phone]

## 2018-08-12 NOTE — H&P (Signed)
Physical Medicine and Rehabilitation Admission H&P        Chief Complaint  Patient presents with  . Functional deficits due to stroke.       HPI: Jeff [name] is a 68 year old male with history of T2DM, HTN, ADHD, whow as admitted to [facility] on 08/08/18 with dizziness, difficulty walking and right facial numbness. CTA head/neck revealed left VA V4 occlusion, superimposed moderate L-VA origin stenosis due to soft plaque, superimposed calcified ICA siphons with moderate left and moderate to severe right siphon stenosis and pseudoarthrosis of lower ACDF with loosening of hardware at C6/C7.  MRI brain revealed new acute nonhemorrhagic punctate infarct left posterolateral medulla and stable atrophy with white matter disease. 2D echo showed EF 60-65% with moderate thickening of AV and no wall abnormality. Dr. Roda Shutters felt that stroke likely due to L-VA dissection v/s atherosclerosis. Plavix added with recommendations for DAPT X 3 weeks followed by Plavix alone.  Patient with resultant dizziness, tendency to lean to the left with balance deficits as well as ataxia affecting ADLs and CIR recommended due to functional decline.       Review of Systems  Constitutional: Negative for chills and fever.  HENT: Negative for hearing loss and tinnitus.   Eyes: Positive for blurred vision (due to retinopathy. Floaters reported).  Respiratory: Negative for cough and shortness of breath.   Cardiovascular: Negative for chest pain and palpitations.  Gastrointestinal: Negative for heartburn and nausea.  Genitourinary: Negative for dysuria and urgency.  Musculoskeletal: Negative for back pain, joint pain and myalgias.  Skin: Negative for itching and rash.  Neurological: Positive for dizziness, sensory change (numbness/tingling BUE/BLE) and weakness.  Psychiatric/Behavioral: Negative for depression. The patient does not have insomnia.             Past Medical History:  Diagnosis Date  . ADHD (attention deficit  hyperactivity disorder)    . Diabetes mellitus    . Hyperlipidemia    . Hypertension             Past Surgical History:  Procedure Laterality Date  . NECK SURGERY      . SPINE SURGERY        Cervical spine x 2; [employer]; [employer].           Family History  Problem Relation Age of Onset  . Heart disease Mother          CHF  . Diabetes Mother    . Heart disease Father    . Cancer Brother        Social History: Married. Independent without AD. Retired Games developer. He  reports that he has never smoked. He has never used smokeless tobacco. He reports that he does not drink alcohol or use drugs.           Allergies  Allergen Reactions  . Flexeril [Cyclobenzaprine Hcl] Other (See Comments)      Causes him to pass out and lowers his BP  . Lidocaine Swelling      throat  . Novocain [Procaine Hcl] Swelling          Medications Prior to Admission  Medication Sig Dispense Refill  . aspirin EC 81 MG tablet Take 1 tablet (81 mg total) by mouth daily. 365 tablet 0  . calcium carbonate (TUMS EX) 750 MG chewable tablet Chew 2 tablets by mouth as needed for heartburn.      . insulin NPH Human (NOVOLIN N) 100 UNIT/ML injection Inject  1.1 mLs (110 Units total) every morning into the skin. And syringes 2/day 110 mL 11  . lisinopril (PRINIVIL,ZESTRIL) 10 MG tablet TAKE 1 TABLET(10 MG) BY MOUTH DAILY 90 tablet 1  . metFORMIN (GLUCOPHAGE) 1000 MG tablet Take 1 tablet (1,000 mg total) by mouth 2 (two) times daily with a meal. 180 tablet 1  . metoCLOPramide (REGLAN) 5 MG tablet Take 1 tablet (5 mg total) by mouth 3 (three) times daily before meals. 60 tablet 0  . omeprazole (PRILOSEC) 40 MG capsule Take 1 capsule (40 mg total) by mouth daily. 30 minutes before breakfast 30 capsule 3  . rosuvastatin (CRESTOR) 20 MG tablet Take 1 tablet (20 mg total) by mouth daily. 90 tablet 3  . Semaglutide (OZEMPIC, 1 MG/DOSE, [place]) Inject 1 mg into the skin once a week. On Sunday      . sucralfate  (CARAFATE) 1 g tablet Take 1 tablet (1 g total) by mouth 4 (four) times daily -  with meals and at bedtime. 60 tablet 0  . glucose blood (ONETOUCH VERIO) test strip 1 each 2 (two) times daily by Other route. And lancets 2/day 100 each 12  . Insulin Syringe-Needle U-100 (B-D INS SYR ULTRAFINE 1CC/31G) 31G X 5/16" 1 ML MISC Used to inject insulin twice daily. 90 each 2      Drug Regimen Review  Drug regimen was reviewed and remains appropriate with no significant issues identified   Home: Home Living Family/patient expects to be discharged to:: Private residence Living Arrangements: Spouse/significant other Available Help at Discharge: Family, Available 24 hours/day Type of Home: House Home Access: Level entry Home Layout: One level Bathroom Shower/Tub: Engineer, manufacturing systems: Standard Bathroom Accessibility: Yes Home Equipment: Environmental consultant - 2 wheels, Bedside commode, Shower seat, Crutches, Wheelchair - manual, Wheelchair - power Additional Comments: Pt normally uses tub shower but in back of house has ramp and fully accessible apartment with 40 in doorways, roll in shower  Lives With: Spouse   Functional History: Prior Function Level of Independence: Independent Comments: Not driving d/t neuropathy   Functional Status:  Mobility: Bed Mobility Overal bed mobility: Needs Assistance Bed Mobility: Supine to Sit Supine to sit: Supervision, HOB elevated Sit to supine: Supervision, HOB elevated General bed mobility comments: Use of rail to get to EOB, mild lightheadedness Transfers Overall transfer level: Needs assistance Equipment used: None Transfers: Sit to/from Stand, Stand Pivot Transfers Sit to Stand: Min assist Stand pivot transfers: Min assist General transfer comment: Assist to steady in standing with cues to decrease speed, equal WB through BLEs and getting trunk to midline upon ascent/descent. Stood from [employer], from chair x5. SPT bed to chair wit Min A for balance  due to ataxia.  Ambulation/Gait Ambulation/Gait assistance: Mod assist Gait Distance (Feet): 15 Feet(x3 bouts) Assistive device: (rail in hallway) Gait Pattern/deviations: Step-to pattern, Step-through pattern, Narrow base of support, Ataxic General Gait Details: Slow, unsteady gait with heavy left lateral lean initially with cues for midline, to widen BoS and for slow controlled movements. Ataxia present in trunk and LLE. 3 seated rest breaks. Does well with 1 UE on rail. Gait velocity: decreased   ADL: ADL Overall ADL's : Needs assistance/impaired Eating/Feeding: Supervision/ safety, Sitting Grooming: Oral care, Wash/dry face, Wash/dry hands, Sitting, Supervision/safety Upper Body Bathing: Sitting, Supervision/ safety Lower Body Bathing: Sit to/from stand, Moderate assistance Upper Body Dressing : Sitting, Min guard Lower Body Dressing: Sit to/from stand, Moderate assistance Toilet Transfer: Moderate assistance, RW, Regular Toilet, Ambulation Toileting- Clothing Manipulation  and Hygiene: Sit to/from stand, Moderate assistance Functional mobility during ADLs: Moderate assistance, Rolling walker, Cueing for safety, Cueing for sequencing General ADL Comments: Pt with very ataxic gait, slight dysmetria of L UE    Cognition: Cognition Overall Cognitive Status: Within Functional Limits for tasks assessed Orientation Level: Oriented X4 Cognition Arousal/Alertness: Awake/alert Behavior During Therapy: WFL for tasks assessed/performed Overall Cognitive Status: Within Functional Limits for tasks assessed General Comments: except some safety awareness deficit, slightly impulsive at times.    Physical Exam: Blood pressure 123/87, pulse 68, temperature (!) 97.5 F (36.4 C), temperature source Oral, resp. rate 20, height  (1.727 m), weight 87.4 kg, SpO2 100 %. Physical Exam  Nursing note and vitals reviewed. Constitutional: He is oriented to person, place, and time. He appears  well-developed.  HENT:  Head: Normocephalic and atraumatic.  Eyes: Pupils are equal, round, and reactive to light. Conjunctivae and EOM are normal.  Neck: Normal range of motion.  Cardiovascular: Normal rate and regular rhythm. Exam reveals no friction rub.  No murmur heard. Respiratory: Effort normal and breath sounds normal. No respiratory distress. He has no wheezes. He has no rales.  GI: Soft. He exhibits no distension. There is no abdominal tenderness.  Musculoskeletal:        General: No deformity or edema.  Neurological: He is alert and oriented to person, place, and time.  Mild right facial weakness. Dysmetria LUE. Temporary left facial sensory loss and pain---dissipated after a few minutes during exam. Speech clear. Reasonable insight and awareness. Moves all four limbs with 4+/5 strength. No obvious sensory loss  Skin: Skin is warm. No rash noted.  Psychiatric:  Anxiety associated with temporary facial sensory loss      Lab Results Last 48 Hours        Results for orders placed or performed during the hospital encounter of 08/08/18 (from the past 48 hour(s))  Glucose, capillary     Status: Abnormal    Collection Time: 08/10/18  1:16 PM  Result Value Ref Range    Glucose-Capillary 276 (H) 70 - 99 mg/dL  CBC     Status: None    Collection Time: 08/10/18  3:33 PM  Result Value Ref Range    WBC 6.8 4.0 - 10.5 K/uL    RBC 4.90 4.22 - 5.81 MIL/uL    Hemoglobin 14.2 13.0 - 17.0 g/dL    HCT 40.9 81.1 - 91.4 %    MCV 84.1 80.0 - 100.0 fL    MCH 29.0 26.0 - 34.0 pg    MCHC 34.5 30.0 - 36.0 g/dL    RDW 78.2 95.6 - 21.3 %    Platelets 230 150 - 400 K/uL    nRBC 0.0 0.0 - 0.2 %      Comment: Performed at [facility], [address] [place], [place] [address]  Basic metabolic panel     Status: Abnormal    Collection Time: 08/10/18  3:33 PM  Result Value Ref Range    Sodium 135 135 - 145 mmol/L    Potassium 4.5 3.5 - 5.1 mmol/L    Chloride 103 98 - 111 mmol/L    CO2 23  22 - 32 mmol/L    Glucose, Bld 282 (H) 70 - 99 mg/dL    BUN 16 8 - 23 mg/dL    Creatinine, Ser 8.46 (H) 0.61 - 1.24 mg/dL    Calcium 9.3 8.9 - 96.2 mg/dL    GFR calc non Af Amer 55 (L) >  60 mL/min    GFR calc Af Amer >60 >60 mL/min    Anion gap 9 5 - 15      Comment: Performed at [facility], [address], [place], [place] [address]  Glucose, capillary     Status: Abnormal    Collection Time: 08/10/18  5:13 PM  Result Value Ref Range    Glucose-Capillary 299 (H) 70 - 99 mg/dL    Comment 1 Notify RN    Glucose, capillary     Status: Abnormal    Collection Time: 08/10/18  9:43 PM  Result Value Ref Range    Glucose-Capillary 271 (H) 70 - 99 mg/dL    Comment 1 Notify RN    CBC     Status: None    Collection Time: 08/11/18  5:49 AM  Result Value Ref Range    WBC 7.9 4.0 - 10.5 K/uL    RBC 4.92 4.22 - 5.81 MIL/uL    Hemoglobin 13.9 13.0 - 17.0 g/dL    HCT 67.2 09.4 - 70.9 %    MCV 83.1 80.0 - 100.0 fL    MCH 28.3 26.0 - 34.0 pg    MCHC 34.0 30.0 - 36.0 g/dL    RDW 62.8 36.6 - 29.4 %    Platelets 216 150 - 400 K/uL    nRBC 0.0 0.0 - 0.2 %      Comment: Performed at [facility], [address], [place], [place] [address]  Basic metabolic panel     Status: Abnormal    Collection Time: 08/11/18  5:49 AM  Result Value Ref Range    Sodium 135 135 - 145 mmol/L    Potassium 3.8 3.5 - 5.1 mmol/L    Chloride 104 98 - 111 mmol/L    CO2 21 (L) 22 - 32 mmol/L    Glucose, Bld 185 (H) 70 - 99 mg/dL    BUN 15 8 - 23 mg/dL    Creatinine, Ser 5.03 (H) 0.61 - 1.24 mg/dL    Calcium 9.0 8.9 - 54.6 mg/dL    GFR calc non Af Amer 57 (L) >60 mL/min    GFR calc Af Amer >60 >60 mL/min    Anion gap 10 5 - 15      Comment: Performed at [facility], [address] [place], [place] [address]  Glucose, capillary     Status: Abnormal    Collection Time: 08/11/18  8:03 AM  Result Value Ref Range    Glucose-Capillary 166 (H) 70 - 99 mg/dL    Comment 1 Notify RN      Comment 2  Document in Chart    Glucose, capillary     Status: None    Collection Time: 08/11/18  1:51 PM  Result Value Ref Range    Glucose-Capillary 92 70 - 99 mg/dL    Comment 1 Notify RN      Comment 2 Document in Chart    Glucose, capillary     Status: Abnormal    Collection Time: 08/11/18  6:11 PM  Result Value Ref Range    Glucose-Capillary 241 (H) 70 - 99 mg/dL    Comment 1 Notify RN      Comment 2 Document in Chart    Glucose, capillary     Status: None    Collection Time: 08/11/18  9:54 PM  Result Value Ref Range    Glucose-Capillary 95 70 - 99 mg/dL    Comment 1 Notify  RN      Comment 2 Document in Chart    CBC     Status: Abnormal    Collection Time: 08/12/18  3:54 AM  Result Value Ref Range    WBC 10.7 (H) 4.0 - 10.5 K/uL    RBC 4.81 4.22 - 5.81 MIL/uL    Hemoglobin 13.8 13.0 - 17.0 g/dL    HCT 16.1 09.6 - 04.5 %    MCV 83.4 80.0 - 100.0 fL    MCH 28.7 26.0 - 34.0 pg    MCHC 34.4 30.0 - 36.0 g/dL    RDW 40.9 81.1 - 91.4 %    Platelets 245 150 - 400 K/uL    nRBC 0.0 0.0 - 0.2 %      Comment: Performed at [facility], [address], [place], [place] [address]  Basic metabolic panel     Status: Abnormal    Collection Time: 08/12/18  3:54 AM  Result Value Ref Range    Sodium 138 135 - 145 mmol/L    Potassium 3.4 (L) 3.5 - 5.1 mmol/L    Chloride 106 98 - 111 mmol/L    CO2 23 22 - 32 mmol/L    Glucose, Bld 108 (H) 70 - 99 mg/dL    BUN 15 8 - 23 mg/dL    Creatinine, Ser 6.21 (H) 0.61 - 1.24 mg/dL    Calcium 9.0 8.9 - 30.8 mg/dL    GFR calc non Af Amer 58 (L) >60 mL/min    GFR calc Af Amer >60 >60 mL/min    Anion gap 9 5 - 15      Comment: Performed at [facility], [address] [place], [place] [address]  Glucose, capillary     Status: None    Collection Time: 08/12/18  8:49 AM  Result Value Ref Range    Glucose-Capillary 87 70 - 99 mg/dL    Comment 1 Notify RN      Comment 2 Document in Chart    Glucose, capillary     Status: Abnormal    Collection  Time: 08/12/18 12:24 PM  Result Value Ref Range    Glucose-Capillary 138 (H) 70 - 99 mg/dL    Comment 1 Notify RN      Comment 2 Document in Chart         Imaging Results (Last 48 hours)  Mr Brain Wo Contrast   Result Date: 08/10/2018 CLINICAL DATA:  Stroke, follow-up. The patient feels more weak and dizzy today than yesterday. EXAM: MRI HEAD WITHOUT CONTRAST TECHNIQUE: Multiplanar, multiecho pulse sequences of the brain and surrounding structures were obtained without intravenous contrast. COMPARISON:  MRI of the brain 08/09/2018 FINDINGS: Brain: A punctate nonhemorrhagic infarct is present in the left posterolateral medulla. There is some T2 signal associated. This infarct was not evident on yesterday's study. No other acute infarct is present. There is no hemorrhage or mass lesion. Ventricles are enlarged, commensurate with the degree of atrophy. Moderate white matter disease is stable. Other white matter disease extends into the brainstem, unchanged. Vascular: Flow is present in the major intracranial arteries. Skull and upper cervical spine: The craniocervical junction is normal. Upper cervical spine is within normal limits. Marrow signal is unremarkable. Sinuses/Orbits: The paranasal sinuses and mastoid air cells are clear. Bilateral lens replacements are noted. Globes and orbits are otherwise unremarkable. IMPRESSION: 1. 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 2. Otherwise stable atrophy and white matter disease. Electronically Signed  By: Marin Roberts M.D.   On: 08/10/2018 19:57             Medical Problem List and Plan: 1.  Functional and mobility deficits secondary to left posterolateral medullary infarct             -admit to inpatient rehab  -waxing and waning left facial sensory loss/dysesthesias might be related to medullary infarct. Certainly are associated with anxiety and could be c/w pseudobulbar affect 2.   Antithrombotics: -DVT/anticoagulation:  Pharmaceutical: Lovenox             -antiplatelet therapy: ASA/Plavix X 3 weeks followed by Plavix alone 3. Pain Management: N/A 4. Mood: LCSW to follow for evaluation and support.              -antipsychotic agents: N/A 5. Neuropsych: This patient is capable of making decisions on his own behalf. 6. Skin/Wound Care: LCSW to follow for evaluation and support.  7. Fluids/Electrolytes/Nutrition: Monitor I/O. Check lytes in am.  8. HTN: Monitor BP bid- avoid hypotension. Lisinopril on hold.  9. CKD: Baseline SCr 1.5--> improved to 1.25 10.T2DM-insulin dependent: Hgb A1c- 9.6. On metformin 1000 mg bid--d/c due to CKD. Marland KitchenNPH 110 units with breakfast and 40 units with supper. Novolog meal coverage as well as SSI for tighter control.  11. Dyslipidemia: On Crestor.  12. Diabetic gastroparesis: Continue to hold Reglan/Carafate. No N/V during current hospitalization.   13. Leucocytosis: Monitor for fevers or other signs of infection. 14. Mild hypokalemia: Recheck labs in am.            EYAL GREENHAW, PA-C 08/12/2018  Post Admission Physician Evaluation: 1. Functional deficits secondary  to left posterolateral medullary infarct. 2. Patient is admitted to receive collaborative, interdisciplinary care between the physiatrist, rehab nursing staff, and therapy team. 3. Patient's level of medical complexity and substantial therapy needs in context of that medical necessity cannot be provided at a lesser intensity of care such as a SNF. 4. Patient has experienced substantial functional loss from his/her baseline which was documented above under the "Functional History" and "Functional Status" headings.  Judging by the patient's diagnosis, physical exam, and functional history, the patient has potential for functional progress which will result in measurable gains while on inpatient rehab.  These gains will be of substantial and practical use upon discharge  in  facilitating mobility and self-care at the household level. 5. Physiatrist will provide 24 hour management of medical needs as well as oversight of the therapy plan/treatment and provide guidance as appropriate regarding the interaction of the two. 6. The Preadmission Screening has been reviewed and patient status is unchanged unless otherwise stated above. 7. 24 hour rehab nursing will assist with bladder management, bowel management, safety, skin/wound care, disease management, medication administration, pain management and patient education  and help integrate therapy concepts, techniques,education, etc. 8. PT will assess and treat for/with: Lower extremity strength, range of motion, stamina, balance, functional mobility, safety, adaptive techniques and equipment, NMR, ego support, community reentry.   Goals are: mod I. 9. OT will assess and treat for/with: ADL's, functional mobility, safety, upper extremity strength, adaptive techniques and equipment, NMR, community reentry, ego support.   Goals are: mod I. Therapy may proceed with showering this patient. 10. SLP will assess and treat for/with: n/a.  Goals are: n/a. 11. Case Management and Social Worker will assess and treat for psychological issues and discharge planning. 12. Team conference will be held weekly to assess progress toward goals and to  determine barriers to discharge. 13. Patient will receive at least 3 hours of therapy per day at least 5 days per week. 14. ELOS: 7 days       15. Prognosis:  excellent   I have personally performed a face to face diagnostic evaluation of this patient and formulated the key components of the plan.  Additionally, I have personally reviewed laboratory data, imaging studies, as well as relevant notes and concur with the physician assistant's documentation above.  Ranelle Oyster, MD, [place]

## 2018-08-12 NOTE — PMR Pre-admission (Signed)
Secondary [facility] PMR Admission Coordinator Pre-Admission Assessment  Patient: Jeff [name] is an 68 y.o., male MRN: [id number] DOB: February 07, 1951 Height: 5' 8" (172.7 cm) Weight: 87.4 kg  Insurance Information HMO:     PPO: Yes     PCP:       IPA:       80/20:       OTHER:   PRIMARY: [employer] Medicare      Policy#: [id number]      Subscriber: patient CM Name: Jenny Reichmann      Phone#: [phone]     Fax#: [phone] Pre-Cert#: [id number] for 7 days with updates due to Celene Skeen at [phone] and fax [phone]      Employer: Not employed Benefits:  Phone #: [phone]     Name: On line portal Eff. Date: 06/04/18     Deduct:  $0      Out of Pocket Max: $4000 (met $131.96)      Life Max: N/A CIR: $160 days 1-10      SNF: $0 days 1-20; $50 days 21-100 Outpatient: medical necessity     Co-Pay: $20/visit Home Health: 100%      Co-Pay: none DME: 80%     Co-Pay: 20% Providers: in network  Medicaid Application Date:       Case Manager:  Disability Application Date:       Case Worker:   Emergency Facilities manager Information    Name Relation Home Work Mobile   [place] Spouse [phone]  [phone]      Current Medical History  Patient Admitting Diagnosis:  L medulla infarct  History of Present Illness: A 68 year old male with history of T2DM, HTN, ADHD, whow as admitted to [facility] on 08/08/18 with dizziness, difficulty walking and right facial numbness. CTA head/neck revealed left VA V4 occlusion, superimposed moderate L-VA origin stenosis due to soft plaque, superimposed calcified ICA siphons with moderate left and moderate to severe right siphon stenosis and pseudoarthrosis of lower ACDF with loosening of hardware at C6/C7.  MRI brain revealed new acute nonhemorrhagic punctate infarct left posterolateral medulla and stable atrophy with white matter disease. 2D echo showed EF 60-65% with moderate thickening of AV and no wall abnormality. Dr. Erlinda Hong felt that stroke likely due to  L-VA dissection v/s atherosclerosis. Plavix added with recommendations for DAPT X 3 weeks followed by Plavix alone.  Patient with resultant dizziness, tendency to lean to the left with balance deficits as well as ataxia affecting ADLs and CIR recommended due to functional decline.    Patient's medical record from [facility] has been reviewed by the rehabilitation admission coordinator and physician.  NIH Stroke scale: 2  Past Medical History  Past Medical History:  Diagnosis Date  . ADHD (attention deficit hyperactivity disorder)   . Diabetes mellitus   . Hyperlipidemia   . Hypertension     Family History   family history includes Cancer in his brother; Diabetes in his mother; Heart disease in his father and mother.  Prior Rehab/Hospitalizations Has the patient had major surgery during 100 days prior to admission? No    Current Medications  Current Facility-Administered Medications:  .   stroke: mapping our early stages of recovery book, , Does not apply, Once, Vonzella Nipple, NP .  acetaminophen (TYLENOL) tablet 650 mg, 650 mg, Oral, Q4H PRN **OR** acetaminophen (TYLENOL) solution 650 mg, 650 mg, Per Tube, Q4H PRN **OR** acetaminophen (TYLENOL) suppository 650 mg, 650 mg, Rectal, Q4H PRN, Laurey Morale  N, NP .  aspirin EC tablet 81 mg, 81 mg, Oral, Daily, Rosalin Hawking, MD, 81 mg at 08/12/18 0907 .  calcium carbonate (TUMS - dosed in mg elemental calcium) chewable tablet 400 mg of elemental calcium, 2 tablet, Oral, PRN, Rosalin Hawking, MD .  clopidogrel (PLAVIX) tablet 75 mg, 75 mg, Oral, Daily, Rosalin Hawking, MD, 75 mg at 08/12/18 0907 .  insulin aspart (novoLOG) injection 0-20 Units, 0-20 Units, Subcutaneous, TID WC, Rosalin Hawking, MD, 3 Units at 08/12/18 1239 .  insulin aspart (novoLOG) injection 0-5 Units, 0-5 Units, Subcutaneous, QHS, Rosalin Hawking, MD, 3 Units at 08/10/18 2146 .  insulin aspart (novoLOG) injection 6 Units, 6 Units, Subcutaneous, TID WC, Rinehuls, Early Chars, PA-C,  6 Units at 08/11/18 1903 .  insulin NPH Human (HUMULIN N,NOVOLIN N) injection 40 Units, 40 Units, Subcutaneous, BID AC & HS, Rinehuls, Early Chars, PA-C, 40 Units at 08/12/18 0909 .  metFORMIN (GLUCOPHAGE) tablet 1,000 mg, 1,000 mg, Oral, BID WC, Rosalin Hawking, MD, 1,000 mg at 08/12/18 0904 .  pantoprazole (PROTONIX) EC tablet 40 mg, 40 mg, Oral, QHS, Aroor, Lanice Schwab, MD, 40 mg at 08/11/18 2317 .  rosuvastatin (CRESTOR) tablet 40 mg, 40 mg, Oral, Daily, Rosalin Hawking, MD, 40 mg at 08/12/18 0907 .  senna-docusate (Senokot-S) tablet 1 tablet, 1 tablet, Oral, QHS PRN, Vonzella Nipple, NP  Patients Current Diet:   Diet Order            Diet Carb Modified Fluid consistency: Thin; Room service appropriate? Yes  Diet effective now              Precautions / Restrictions Precautions Precautions: Fall Restrictions Weight Bearing Restrictions: No   Has the patient had 2 or more falls or a fall with injury in the past year?Yes.  Patient reports 3 falls in the past year with bruising/soreness.  Prior Activity Level Limited Community (1-2x/wk): Went out 3 X a week  Prior Functional Level Prior Function Level of Independence: Independent Comments: Not driving d/t neuropathy    Self Care: Did the patient need help bathing, dressing, using the toilet or eating?  Independent.  Wife reports that patient did need help with drawing up his insulin due to decreased vision.  Indoor Mobility: Did the patient need assistance with walking from room to room (with or without device)? Independent  Stairs: Did the patient need assistance with internal or external stairs (with or without device)? Independent  Functional Cognition: Did the patient need help planning regular tasks such as shopping or remembering to take medications? Independent  Home Assistive Devices / Equipment Home Assistive Devices/Equipment: None Home Equipment: Environmental consultant - 2 wheels, Bedside commode, Shower seat, Crutches, Wheelchair -  manual, Wheelchair - power  Prior Device Use: Indicate devices/aids used by the patient prior to current illness, exacerbation or injury? None  Prior Functional Level Comments: Not driving d/t neuropathy   Prior Functional Level Current Functional Level  Bed Mobility Independent Supervision  Transfers Independent Min assist  Mobility - Walk/Wheelchair Independent Mod assist 15" X 3 rail in hallway  Mobility - Ambulation/Gait Independent Mod assist  Upper Body Dressing Independent Sitting, Min guard  Lower Body Dressing Independent Sit to/from stand, Moderate assistance  Grooming Independent Supervision  Eating/Drinking Indpendent Supervision/ safety, Sitting  Toilet Transfer Independent Moderate assistance, RW, Regular Toilet, Ambulation  Bladder Continence WDL Continent, up to bathroom with assist  Bowel Management  WDL Last BM 08/12/18  Stair Climbing Independent Not tested  Communication WDL No difficulties  Memory WDL WDL  Cooking/Meal Prep  Independent      Housework  Independent   Money Management  Independent   Driving  Not driving due to vision changes.  Wife drives.     Special needs/care consideration BiPAP/CPAP No CPM No Continuous Drip IV No  Dialysis No       Life Vest No Oxygen No Special Bed No Trach Size No Wound Vac (area) No       Skin Dry skin                           Bowel mgmt: Last BM 08/12/18 Bladder mgmt: Voiding up in bathroom with assist Diabetic mgmt Yes, on insulin at home  Previous Home Environment Living Arrangements: Spouse/significant other  Lives With: Spouse Available Help at Discharge: Family, Available 24 hours/day Type of Home: House Home Layout: One level Home Access: Level entry Bathroom Shower/Tub: Chiropodist: Standard Bathroom Accessibility: Yes How Accessible: Accessible via wheelchair, Accessible via walker Ragland: No Additional Comments: Pt normally uses tub shower but in back of  house has ramp and fully accessible apartment with 40 in doorways, roll in shower  Discharge Living Setting Plans for Discharge Living Setting: Patient's home, House, Lives with (comment)(Lives with wife.) Type of Home at Discharge: House Discharge Home Layout: One level Discharge Home Access: Stairs to enter, Ramped entrance Entrance Stairs-Rails: None Entrance Stairs-Number of Steps: Has 3 steps at the front and a ramp at the back entry Discharge Bathroom Shower/Tub: Tub/shower unit, Curtain Discharge Bathroom Toilet: Handicapped height Discharge Bathroom Accessibility: Yes How Accessible: Accessible via walker(Has several bathrooms to choose from.) Does the patient have any problems obtaining your medications?: No  Social/Family/Support Systems Patient Roles: Spouse, Parent(Has a wife and 2 daughters.) Contact Information: Ezri Landers - wife Anticipated Caregiver: Wife Anticipated Caregiver's Contact Information: [name] - wife - (c) [phone] Ability/Limitations of Caregiver: wife keeps her grand children but is at home Caregiver Availability: 24/7 Discharge Plan Discussed with Primary Caregiver: Yes Is Caregiver In Agreement with Plan?: Yes Does Caregiver/Family have Issues with Lodging/Transportation while Pt is in Rehab?: No  Goals/Additional Needs Patient/Family Goal for Rehab: PT/OT mod I goals Expected length of stay: 7 days Cultural Considerations: [employer] - visits with his minister Dietary Needs: Carb mod, med cal, thin liquids Equipment Needs: TBD Pt/Family Agrees to Admission and willing to participate: Yes Program Orientation Provided & Reviewed with Pt/Caregiver Including Roles  & Responsibilities: Yes  Patient Condition: I have reviewed medical records from [facility], spoken with attending MD, and patient, wife and daughter. I met with patient at the bedside for inpatient rehabilitation assessment.  Patient will benefit from ongoing PT/OT, can actively  participate in 3 hours of therapy a day 5 days of the week, and can make measurable gains during the admission.  Patient will also benefit from the coordinated team approach during an Inpatient Acute Rehabilitation admission.  The patient will receive intensive therapy as well as Rehabilitation physician, nursing, social worker, and care management interventions.  Due to safety, disease management, medical administration,  patient education the patient requires 24 hour a day rehabilitation nursing.  The patient is currently min to mod assist with mobility and basic ADLs.  Discharge setting and therapy post discharge at home with [facility] or outpatient therapy is anticipated.  Patient has agreed to participate in the Acute Inpatient Rehabilitation Program and will admit today.  Preadmission Screen Completed By:  Retta Diones, 08/12/2018 1:00 PM ______________________________________________________________________   Discussed status with Dr. Naaman Plummer on 08/12/18 at 1326 and received telephone approval for admission today.  Admission Coordinator:  Retta Diones, time 1326/Date 08/12/18   Assessment/Plan: Diagnosis:left medullary infarct 1. Does the need for close, 24 hr/day  Medical supervision in concert with the patient's rehab needs make it unreasonable for this patient to be served in a less intensive setting? Yes 2. Co-Morbidities requiring supervision/potential complications: dm, htn, adhd 3. Due to bladder management, bowel management, safety, skin/wound care, disease management, medication administration, pain management and patient education, does the patient require 24 hr/day rehab nursing? Yes 4. Does the patient require coordinated care of a physician, rehab nurse, PT (1-2 hrs/day, 5 days/week) and OT (1-2 hrs/day, 5 days/week) to address physical and functional deficits in the context of the above medical diagnosis(es)? Yes Addressing deficits in the following areas: balance, endurance,  locomotion, strength, transferring, bowel/bladder control, bathing, dressing, feeding, grooming, toileting and psychosocial support 5. Can the patient actively participate in an intensive therapy program of at least 3 hrs of therapy 5 days a week? Yes 6. The potential for patient to make measurable gains while on inpatient rehab is excellent 7. Anticipated functional outcomes upon discharge from inpatients are: modified independent PT, modified independent OT, n/a SLP 8. Estimated rehab length of stay to reach the above functional goals is: 7 days 9. Anticipated D/C setting: Home 10. Anticipated post D/C treatments: HH therapy and Outpatient therapy 11. Overall Rehab/Functional Prognosis: excellent    RECOMMENDATIONS: This patient's condition is appropriate for continued rehabilitative care in the following setting: CIR Patient has agreed to participate in recommended program. Yes Note that insurance prior authorization may be required for reimbursement for recommended care.  Comment:  Meredith Staggers, MD, [facility] 08/12/2018   Retta Diones 08/12/2018

## 2018-08-12 NOTE — Plan of Care (Addendum)
Nutrition Education Note  RD consult for DM Education.   Lab Results  Component Value Date   HGBA1C 9.6 (H) 08/09/2018    Handouts given to the Pt were provided from [facility] Nutrition Care Manual and included:  Carbohydrate counting for people with Diabetes, Diabetes label reading tips, and Using nutrition Labels: Carbohydrates.  Reviewed diet with pt which included items such as smoothies with soy milk and fruit, and typical meals of protein starch and vegetable. Pt typically eats chicken and like collard greens. Points discussed from education was carbohydrate consistency, following a 45-70g of Carbohydrate per meal and 15-30g per snacks. Discussed plate method with pt and family, and pt was aware of the method, but wanted more details. We discussed protein potions and wide variety of non-starchy vegetable options, while focusing on limiting starchy options such as potatoes and white rice. Teach back method used, pt was able to recall carbohydrate consistency and amounts as well as the plate method. Pt had several inquiries, about specifics, such as soy milk, fruits, and items such as frosted/unfrosted mini wheats. Advised pt on soy milk is fine source of protein, but would stay with the unsweetened version. Fruit is good, but to count it with carbs, and if pt is having a frosted mini-wheats craving then, do 1/2 and 1/2 frosted to unfrosted.   Pt expected compliance is very good. Pt was diagnosed several years ago and has heard this information before, but wanted a refresher.   BMI is 29.30 kg/m. BMI is currently overweight.   Pt is on CHO mod Diet. Pt meal completion is ~100%  Labs and medications reviewed. No further nutrition interventions at this time. If further nutrition needs arise, please consult RD. Pt and family are agreeable to outpatient to [facility] Diabetes Education Services referral.   Burnard Bunting, [facility] Dietetic Intern

## 2018-08-12 NOTE — Progress Notes (Signed)
Transferring patient to [facility]. Patients family at bedside.

## 2018-08-12 NOTE — Progress Notes (Signed)
Inpatient Diabetes Program Recommendations  AACE/ADA: New Consensus Statement on Inpatient Glycemic Control (2015)  Target Ranges:  Prepandial:   less than 140 mg/dL      Peak postprandial:   less than 180 mg/dL (1-2 hours)      Critically ill patients:  140 - 180 mg/dL   Results for MEYER, ARORA (MRN [id number]) as of 08/12/2018 12:51  Ref. Range 08/11/2018 08:03 08/11/2018 13:51 08/11/2018 18:11 08/11/2018 21:54  Glucose-Capillary Latest Ref Range: 70 - 99 mg/dL 166 (H)  4 units NOVOLOG +  110 units NPH Insulin  92 241 (H)  13 units NOVOLOG  95   Results for IZAK, ANDING (MRN [id number]) as of 08/12/2018 12:51  Ref. Range 08/12/2018 08:49 08/12/2018 12:24  Glucose-Capillary Latest Ref Range: 70 - 99 mg/dL 87    40 units NPH Insulin 138 (H)    Admit with: Code Stroke  History: DM  Home DM Meds: NPH 110 units QAM                             Metformin 1000 mg BID                             Ozempic 1 mg Qweek  Current Orders: NPH 40 units BID                            Novolog Resistant Correction Scale/ SSI (0-20 units) TID AC + HS      Novolog 6 units TID with meals                            Metformin 1000 mg BID    Endocrinologist: Dr. Renato Shin with [facility]--Last seen 08/06/2018.  No changes made to pt's Insulin regimen at that visit.  Patient was strongly encouraged by Dr. Loanne Drilling to never miss insulin doses.   Met with pt yesterday to discuss home DM insulin regimen. (see DM Coordinator note from 08/11/2018).  Because NPH Insulin does not last 24 hours, made recommendations to split the NPH into 2 doses (75% of total home dose to start) and to add Novolog meal coverage 6 units TID with meals.  New regimen started this AM.  Discussed with pt and his family that we have adjusted his regimen in hospital this AM and will make adjustments as needed.  Pt and his family very happy to see that his CBGs are markedly improved since admission.  Explained to pt that the doctors  on the Rehab unit will continue to adjust his insulins as needed and strongly encouraged pt to follow up with OP ENDO (per pt, he would like to seek care under new ENDO).     --Will follow patient during hospitalization--  Wyn Quaker RN, MSN, CDE Diabetes Coordinator Inpatient Glycemic Control Team Team Pager: [phone] (8a-5p)

## 2018-08-12 NOTE — Progress Notes (Signed)
Physical Therapy Treatment Patient Details Name: Jeff [name] MRN: [id number] DOB: January 30, 1951 Today's Date: 08/12/2018    History of Present Illness Jeff [name] is an 68 y.o. male  who presented with dizziness, right facial numbness and difficulty walking. Recieved tPa in ED. MRI no acute infarct, chronic small vessel ischemia and volume loss. Repeat Brain MRI 3/8-new infarct posterolateral medulla. With PMH of HTN, DM, HLD.     PT Comments    Patient progressing well towards PT goals. Less lightheadedness noted today. Tolerated gait training with Mod A due to left lateral lean, ataxia and narrow BoS. Worked on controlling sit to stand transfers and finding midline with trunk during gait training. Able to progress to Min A with max cues. Able to stand at sink and perform ADL tasks with close min guard for safety. Pt highly motivated to get to CIR and return to PLOF. Will follow.    Follow Up Recommendations  CIR     Equipment Recommendations  Other (comment)(defer)    Recommendations for Other Services       Precautions / Restrictions Precautions Precautions: Fall Restrictions Weight Bearing Restrictions: No    Mobility  Bed Mobility Overal bed mobility: Needs Assistance Bed Mobility: Supine to Sit     Supine to sit: Supervision;HOB elevated     General bed mobility comments: Use of rail to get to EOB, mild lightheadedness  Transfers Overall transfer level: Needs assistance Equipment used: None Transfers: Sit to/from [employer] Sit to Stand: Min assist Stand pivot transfers: Min assist       General transfer comment: Assist to steady in standing with cues to decrease speed, equal WB through BLEs and getting trunk to midline upon ascent/descent. Stood from [employer], from chair x5. SPT bed to chair wit Min A for balance due to ataxia.   Ambulation/Gait Ambulation/Gait assistance: Mod assist Gait Distance (Feet): 15 Feet(x3 bouts) Assistive device: (rail  in hallway) Gait Pattern/deviations: Step-to pattern;Step-through pattern;Narrow base of support;Ataxic Gait velocity: decreased   General Gait Details: Slow, unsteady gait with heavy left lateral lean initially with cues for midline, to widen BoS and for slow controlled movements. Ataxia present in trunk and LLE. 3 seated rest breaks. Does well with 1 UE on rail.   Stairs             Wheelchair Mobility    Modified Rankin (Stroke Patients Only) Modified Rankin (Stroke Patients Only) Pre-Morbid Rankin Score: No symptoms Modified Rankin: Moderately severe disability     Balance Overall balance assessment: Needs assistance Sitting-balance support: Feet supported;No upper extremity supported Sitting balance-Leahy Scale: Fair     Standing balance support: During functional activity Standing balance-Leahy Scale: Fair Standing balance comment: Able to stand at sink and perform ADL tasks- brushing teeth, washing face, brushing hair.                             Cognition Arousal/Alertness: Awake/alert Behavior During Therapy: WFL for tasks assessed/performed Overall Cognitive Status: Within Functional Limits for tasks assessed                                 General Comments: except some safety awareness deficit, slightly impulsive at times.       Exercises      General Comments General comments (skin integrity, edema, etc.): Wife and children present during session.  Pertinent Vitals/Pain Pain Assessment: No/denies pain    Home Living                      Prior Function            PT Goals (current goals can now be found in the care plan section) Progress towards PT goals: Progressing toward goals    Frequency    Min 4X/week      PT Plan Current plan remains appropriate    Co-evaluation              AM-PAC PT "6 Clicks" Mobility   Outcome Measure  Help needed turning from your back to your side while in  a flat bed without using bedrails?: None Help needed moving from lying on your back to sitting on the side of a flat bed without using bedrails?: A Little Help needed moving to and from a bed to a chair (including a wheelchair)?: A Lot Help needed standing up from a chair using your arms (e.g., wheelchair or bedside chair)?: A Little Help needed to walk in hospital room?: A Lot Help needed climbing 3-5 steps with a railing? : Total 6 Click Score: 15    End of Session Equipment Utilized During Treatment: Gait belt Activity Tolerance: Patient tolerated treatment well Patient left: in chair;with call bell/phone within reach;with family/visitor present Nurse Communication: Mobility status PT Visit Diagnosis: Unsteadiness on feet (R26.81);Dizziness and giddiness (R42);Ataxic gait (R26.0)     Time: 8882-8003 PT Time Calculation (min) (ACUTE ONLY): 25 min  Charges:  $Gait Training: 8-22 mins $Neuromuscular Re-education: 8-22 mins                     [name], [place], DPT Acute Rehabilitation Services Pager [phone] Office [phone]       [name] 08/12/2018, 12:40 PM

## 2018-08-12 NOTE — H&P (Signed)
Physical Medicine and Rehabilitation Admission H&P    Chief Complaint  Patient presents with  . Functional deficits due to stroke.     HPI: Jeff [name] is a 68 year old male with history of T2DM, HTN, ADHD, whow as admitted to [facility] on 08/08/18 with dizziness, difficulty walking and right facial numbness. CTA head/neck revealed left VA V4 occlusion, superimposed moderate L-VA origin stenosis due to soft plaque, superimposed calcified ICA siphons with moderate left and moderate to severe right siphon stenosis and pseudoarthrosis of lower ACDF with loosening of hardware at C6/C7.  MRI brain revealed new acute nonhemorrhagic punctate infarct left posterolateral medulla and stable atrophy with white matter disease. 2D echo showed EF 60-65% with moderate thickening of AV and no wall abnormality. Dr. Roda Shutters felt that stroke likely due to L-VA dissection v/s atherosclerosis. Plavix added with recommendations for DAPT X 3 weeks followed by Plavix alone.  Patient with resultant dizziness, tendency to lean to the left with balance deficits as well as ataxia affecting ADLs and CIR recommended due to functional decline.     Review of Systems  Constitutional: Negative for chills and fever.  HENT: Negative for hearing loss and tinnitus.   Eyes: Positive for blurred vision (due to retinopathy. Floaters reported).  Respiratory: Negative for cough and shortness of breath.   Cardiovascular: Negative for chest pain and palpitations.  Gastrointestinal: Negative for heartburn and nausea.  Genitourinary: Negative for dysuria and urgency.  Musculoskeletal: Negative for back pain, joint pain and myalgias.  Skin: Negative for itching and rash.  Neurological: Positive for dizziness, sensory change (numbness/tingling BUE/BLE) and weakness.  Psychiatric/Behavioral: Negative for depression. The patient does not have insomnia.       Past Medical History:  Diagnosis Date  . ADHD (attention deficit hyperactivity  disorder)   . Diabetes mellitus   . Hyperlipidemia   . Hypertension     Past Surgical History:  Procedure Laterality Date  . NECK SURGERY    . SPINE SURGERY     Cervical spine x 2; [employer]; [employer].    Family History  Problem Relation Age of Onset  . Heart disease Mother        CHF  . Diabetes Mother   . Heart disease Father   . Cancer Brother     Social History: Married. Independent without AD. Retired Games developer. He  reports that he has never smoked. He has never used smokeless tobacco. He reports that he does not drink alcohol or use drugs.     Allergies  Allergen Reactions  . Flexeril [Cyclobenzaprine Hcl] Other (See Comments)    Causes him to pass out and lowers his BP  . Lidocaine Swelling    throat  . Novocain [Procaine Hcl] Swelling   Medications Prior to Admission  Medication Sig Dispense Refill  . aspirin EC 81 MG tablet Take 1 tablet (81 mg total) by mouth daily. 365 tablet 0  . calcium carbonate (TUMS EX) 750 MG chewable tablet Chew 2 tablets by mouth as needed for heartburn.    . insulin NPH Human (NOVOLIN N) 100 UNIT/ML injection Inject 1.1 mLs (110 Units total) every morning into the skin. And syringes 2/day 110 mL 11  . lisinopril (PRINIVIL,ZESTRIL) 10 MG tablet TAKE 1 TABLET(10 MG) BY MOUTH DAILY 90 tablet 1  . metFORMIN (GLUCOPHAGE) 1000 MG tablet Take 1 tablet (1,000 mg total) by mouth 2 (two) times daily with a meal. 180 tablet 1  . metoCLOPramide (REGLAN) 5 MG tablet  Take 1 tablet (5 mg total) by mouth 3 (three) times daily before meals. 60 tablet 0  . omeprazole (PRILOSEC) 40 MG capsule Take 1 capsule (40 mg total) by mouth daily. 30 minutes before breakfast 30 capsule 3  . rosuvastatin (CRESTOR) 20 MG tablet Take 1 tablet (20 mg total) by mouth daily. 90 tablet 3  . Semaglutide (OZEMPIC, 1 MG/DOSE, [place]) Inject 1 mg into the skin once a week. On Sunday    . sucralfate (CARAFATE) 1 g tablet Take 1 tablet (1 g total) by mouth 4 (four) times daily  -  with meals and at bedtime. 60 tablet 0  . glucose blood (ONETOUCH VERIO) test strip 1 each 2 (two) times daily by Other route. And lancets 2/day 100 each 12  . Insulin Syringe-Needle U-100 (B-D INS SYR ULTRAFINE 1CC/31G) 31G X 5/16" 1 ML MISC Used to inject insulin twice daily. 90 each 2    Drug Regimen Review  Drug regimen was reviewed and remains appropriate with no significant issues identified  Home: Home Living Family/patient expects to be discharged to:: Private residence Living Arrangements: Spouse/significant other Available Help at Discharge: Family, Available 24 hours/day Type of Home: House Home Access: Level entry Home Layout: One level Bathroom Shower/Tub: Engineer, manufacturing systems: Standard Bathroom Accessibility: Yes Home Equipment: Environmental consultant - 2 wheels, Bedside commode, Shower seat, Crutches, Wheelchair - manual, Wheelchair - power Additional Comments: Pt normally uses tub shower but in back of house has ramp and fully accessible apartment with 40 in doorways, roll in shower  Lives With: Spouse   Functional History: Prior Function Level of Independence: Independent Comments: Not driving d/t neuropathy  Functional Status:  Mobility: Bed Mobility Overal bed mobility: Needs Assistance Bed Mobility: Supine to Sit Supine to sit: Supervision, HOB elevated Sit to supine: Supervision, HOB elevated General bed mobility comments: Use of rail to get to EOB, mild lightheadedness Transfers Overall transfer level: Needs assistance Equipment used: None Transfers: Sit to/from Stand, Stand Pivot Transfers Sit to Stand: Min assist Stand pivot transfers: Min assist General transfer comment: Assist to steady in standing with cues to decrease speed, equal WB through BLEs and getting trunk to midline upon ascent/descent. Stood from [employer], from chair x5. SPT bed to chair wit Min A for balance due to ataxia.  Ambulation/Gait Ambulation/Gait assistance: Mod assist Gait  Distance (Feet): 15 Feet(x3 bouts) Assistive device: (rail in hallway) Gait Pattern/deviations: Step-to pattern, Step-through pattern, Narrow base of support, Ataxic General Gait Details: Slow, unsteady gait with heavy left lateral lean initially with cues for midline, to widen BoS and for slow controlled movements. Ataxia present in trunk and LLE. 3 seated rest breaks. Does well with 1 UE on rail. Gait velocity: decreased    ADL: ADL Overall ADL's : Needs assistance/impaired Eating/Feeding: Supervision/ safety, Sitting Grooming: Oral care, Wash/dry face, Wash/dry hands, Sitting, Supervision/safety Upper Body Bathing: Sitting, Supervision/ safety Lower Body Bathing: Sit to/from stand, Moderate assistance Upper Body Dressing : Sitting, Min guard Lower Body Dressing: Sit to/from stand, Moderate assistance Toilet Transfer: Moderate assistance, RW, Regular Toilet, Ambulation Toileting- Clothing Manipulation and Hygiene: Sit to/from stand, Moderate assistance Functional mobility during ADLs: Moderate assistance, Rolling walker, Cueing for safety, Cueing for sequencing General ADL Comments: Pt with very ataxic gait, slight dysmetria of L UE   Cognition: Cognition Overall Cognitive Status: Within Functional Limits for tasks assessed Orientation Level: Oriented X4 Cognition Arousal/Alertness: Awake/alert Behavior During Therapy: WFL for tasks assessed/performed Overall Cognitive Status: Within Functional Limits for tasks assessed  General Comments: except some safety awareness deficit, slightly impulsive at times.   Physical Exam: Blood pressure 123/87, pulse 68, temperature (!) 97.5 F (36.4 C), temperature source Oral, resp. rate 20, height 5\' 8"  (1.727 m), weight 87.4 kg, SpO2 100 %. Physical Exam  Nursing note and vitals reviewed. Constitutional: He is oriented to person, place, and time. He appears well-developed.  HENT:  Head: Normocephalic and atraumatic.  Eyes: Pupils are equal,  round, and reactive to light. Conjunctivae and EOM are normal.  Neck: Normal range of motion.  Cardiovascular: Normal rate and regular rhythm. Exam reveals no friction rub.  No murmur heard. Respiratory: Effort normal and breath sounds normal. No respiratory distress. He has no wheezes. He has no rales.  GI: Soft. He exhibits no distension. There is no abdominal tenderness.  Musculoskeletal:        General: No deformity or edema.  Neurological: He is alert and oriented to person, place, and time.  Mild right facial weakness. Dysmetria LUE. Temporary left facial sensory loss and pain---dissipated after a few minutes during exam. Speech clear. Reasonable insight and awareness. Moves all four limbs with 4+/5 strength. No obvious sensory loss  Skin: Skin is warm. No rash noted.  Psychiatric:  Anxiety associated with temporary facial sensory loss    Results for orders placed or performed during the hospital encounter of 08/08/18 (from the past 48 hour(s))  Glucose, capillary     Status: Abnormal   Collection Time: 08/10/18  1:16 PM  Result Value Ref Range   Glucose-Capillary 276 (H) 70 - 99 mg/dL  CBC     Status: None   Collection Time: 08/10/18  3:33 PM  Result Value Ref Range   WBC 6.8 4.0 - 10.5 K/uL   RBC 4.90 4.22 - 5.81 MIL/uL   Hemoglobin 14.2 13.0 - 17.0 g/dL   HCT 97.0 26.3 - 78.5 %   MCV 84.1 80.0 - 100.0 fL   MCH 29.0 26.0 - 34.0 pg   MCHC 34.5 30.0 - 36.0 g/dL   RDW 88.5 02.7 - 74.1 %   Platelets 230 150 - 400 K/uL   nRBC 0.0 0.0 - 0.2 %    Comment: Performed at [facility], [address] [place], [place] [address]  Basic metabolic panel     Status: Abnormal   Collection Time: 08/10/18  3:33 PM  Result Value Ref Range   Sodium 135 135 - 145 mmol/L   Potassium 4.5 3.5 - 5.1 mmol/L   Chloride 103 98 - 111 mmol/L   CO2 23 22 - 32 mmol/L   Glucose, Bld 282 (H) 70 - 99 mg/dL   BUN 16 8 - 23 mg/dL   Creatinine, Ser 7.67 (H) 0.61 - 1.24 mg/dL   Calcium 9.3 8.9 -  20.9 mg/dL   GFR calc non Af Amer 55 (L) >60 mL/min   GFR calc Af Amer >60 >60 mL/min   Anion gap 9 5 - 15    Comment: Performed at [facility], [address] [place], [place] [address]  Glucose, capillary     Status: Abnormal   Collection Time: 08/10/18  5:13 PM  Result Value Ref Range   Glucose-Capillary 299 (H) 70 - 99 mg/dL   Comment 1 Notify RN   Glucose, capillary     Status: Abnormal   Collection Time: 08/10/18  9:43 PM  Result Value Ref Range   Glucose-Capillary 271 (H) 70 - 99 mg/dL   Comment 1 Notify RN  CBC     Status: None   Collection Time: 08/11/18  5:49 AM  Result Value Ref Range   WBC 7.9 4.0 - 10.5 K/uL   RBC 4.92 4.22 - 5.81 MIL/uL   Hemoglobin 13.9 13.0 - 17.0 g/dL   HCT 16.1 09.6 - 04.5 %   MCV 83.1 80.0 - 100.0 fL   MCH 28.3 26.0 - 34.0 pg   MCHC 34.0 30.0 - 36.0 g/dL   RDW 40.9 81.1 - 91.4 %   Platelets 216 150 - 400 K/uL   nRBC 0.0 0.0 - 0.2 %    Comment: Performed at [facility], [address], [place], [place] [address]  Basic metabolic panel     Status: Abnormal   Collection Time: 08/11/18  5:49 AM  Result Value Ref Range   Sodium 135 135 - 145 mmol/L   Potassium 3.8 3.5 - 5.1 mmol/L   Chloride 104 98 - 111 mmol/L   CO2 21 (L) 22 - 32 mmol/L   Glucose, Bld 185 (H) 70 - 99 mg/dL   BUN 15 8 - 23 mg/dL   Creatinine, Ser 6.21 (H) 0.61 - 1.24 mg/dL   Calcium 9.0 8.9 - 30.8 mg/dL   GFR calc non Af Amer 57 (L) >60 mL/min   GFR calc Af Amer >60 >60 mL/min   Anion gap 10 5 - 15    Comment: Performed at [facility], [address], [place], [place] [address]  Glucose, capillary     Status: Abnormal   Collection Time: 08/11/18  8:03 AM  Result Value Ref Range   Glucose-Capillary 166 (H) 70 - 99 mg/dL   Comment 1 Notify RN    Comment 2 Document in Chart   Glucose, capillary     Status: None   Collection Time: 08/11/18  1:51 PM  Result Value Ref Range   Glucose-Capillary 92 70 - 99 mg/dL   Comment 1 Notify RN    Comment 2  Document in Chart   Glucose, capillary     Status: Abnormal   Collection Time: 08/11/18  6:11 PM  Result Value Ref Range   Glucose-Capillary 241 (H) 70 - 99 mg/dL   Comment 1 Notify RN    Comment 2 Document in Chart   Glucose, capillary     Status: None   Collection Time: 08/11/18  9:54 PM  Result Value Ref Range   Glucose-Capillary 95 70 - 99 mg/dL   Comment 1 Notify RN    Comment 2 Document in Chart   CBC     Status: Abnormal   Collection Time: 08/12/18  3:54 AM  Result Value Ref Range   WBC 10.7 (H) 4.0 - 10.5 K/uL   RBC 4.81 4.22 - 5.81 MIL/uL   Hemoglobin 13.8 13.0 - 17.0 g/dL   HCT 69.6 29.5 - 28.4 %   MCV 83.4 80.0 - 100.0 fL   MCH 28.7 26.0 - 34.0 pg   MCHC 34.4 30.0 - 36.0 g/dL   RDW 13.2 44.0 - 10.2 %   Platelets 245 150 - 400 K/uL   nRBC 0.0 0.0 - 0.2 %    Comment: Performed at [facility], [address] [place], [place] [address]  Basic metabolic panel     Status: Abnormal   Collection Time: 08/12/18  3:54 AM  Result Value Ref Range   Sodium 138 135 - 145 mmol/L   Potassium 3.4 (L) 3.5 - 5.1 mmol/L   Chloride 106  98 - 111 mmol/L   CO2 23 22 - 32 mmol/L   Glucose, Bld 108 (H) 70 - 99 mg/dL   BUN 15 8 - 23 mg/dL   Creatinine, Ser 8.67 (H) 0.61 - 1.24 mg/dL   Calcium 9.0 8.9 - 67.2 mg/dL   GFR calc non Af Amer 58 (L) >60 mL/min   GFR calc Af Amer >60 >60 mL/min   Anion gap 9 5 - 15    Comment: Performed at [facility], [address], [place], [place] [address]  Glucose, capillary     Status: None   Collection Time: 08/12/18  8:49 AM  Result Value Ref Range   Glucose-Capillary 87 70 - 99 mg/dL   Comment 1 Notify RN    Comment 2 Document in Chart   Glucose, capillary     Status: Abnormal   Collection Time: 08/12/18 12:24 PM  Result Value Ref Range   Glucose-Capillary 138 (H) 70 - 99 mg/dL   Comment 1 Notify RN    Comment 2 Document in Chart    Mr Brain Wo Contrast  Result Date: 08/10/2018 CLINICAL DATA:  Stroke, follow-up. The patient  feels more weak and dizzy today than yesterday. EXAM: MRI HEAD WITHOUT CONTRAST TECHNIQUE: Multiplanar, multiecho pulse sequences of the brain and surrounding structures were obtained without intravenous contrast. COMPARISON:  MRI of the brain 08/09/2018 FINDINGS: Brain: A punctate nonhemorrhagic infarct is present in the left posterolateral medulla. There is some T2 signal associated. This infarct was not evident on yesterday's study. No other acute infarct is present. There is no hemorrhage or mass lesion. Ventricles are enlarged, commensurate with the degree of atrophy. Moderate white matter disease is stable. Other white matter disease extends into the brainstem, unchanged. Vascular: Flow is present in the major intracranial arteries. Skull and upper cervical spine: The craniocervical junction is normal. Upper cervical spine is within normal limits. Marrow signal is unremarkable. Sinuses/Orbits: The paranasal sinuses and mastoid air cells are clear. Bilateral lens replacements are noted. Globes and orbits are otherwise unremarkable. IMPRESSION: 1. 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 2. Otherwise stable atrophy and white matter disease. Electronically Signed   By: Marin Roberts M.D.   On: 08/10/2018 19:57       Medical Problem List and Plan: 1.  Functional and mobility deficits secondary to left posterolateral medullary infarct  -admit to inpatient rehab 2.  Antithrombotics: -DVT/anticoagulation:  Pharmaceutical: Lovenox  -antiplatelet therapy: ASA/Plavix X 3 weeks followed by Plavix alone 3. Pain Management: N/A 4. Mood: LCSW to follow for evaluation and support.   -antipsychotic agents: N/A 5. Neuropsych: This patient is capable of making decisions on his own behalf. 6. Skin/Wound Care: LCSW to follow for evaluation and support.  7. Fluids/Electrolytes/Nutrition: Monitor I/O. Check lytes in am.  8. HTN: Monitor BP bid- avoid hypotension.  Lisinopril on hold.  9. CKD: Baseline SCr 1.5--> improved to 1.25 10.T2DM-insulin dependent: Hgb A1c- 9.6. On metformin 1000 mg bid--d/c due to CKD. Marland KitchenNPH 110 units with breakfast and 40 units with supper. Novolog meal coverage as well as SSI for tighter control.  11. Dyslipidemia: On Crestor.  12. Diabetic gastroparesis: Continue to hold Reglan/Carafate. No N/V during current hospitalization.   13. Leucocytosis: Monitor for fevers or other signs of infection. 14. Mild hypokalemia: Recheck labs in am.        ELWARD RENICK, PA-C 08/12/2018

## 2018-08-12 NOTE — Progress Notes (Signed)
Patient ID: Jeff [name], male   DOB: Dec 11, 1950, 68 y.o.   MRN: [id number] Admit to unit, oriented to unit, medications, plan of care and stroke prevention information. States an understanding of information. Jeff [name]

## 2018-08-13 ENCOUNTER — Inpatient Hospital Stay (HOSPITAL_COMMUNITY): Payer: Medicare Other | Admitting: Occupational Therapy

## 2018-08-13 ENCOUNTER — Ambulatory Visit: Payer: BC Managed Care – PPO | Admitting: Nurse Practitioner

## 2018-08-13 ENCOUNTER — Inpatient Hospital Stay (HOSPITAL_COMMUNITY): Payer: Medicare Other

## 2018-08-13 ENCOUNTER — Other Ambulatory Visit: Payer: Self-pay

## 2018-08-13 LAB — COMPREHENSIVE METABOLIC PANEL
ALBUMIN: 3.4 g/dL — AB (ref 3.5–5.0)
ALT: 24 U/L (ref 0–44)
AST: 20 U/L (ref 15–41)
Alkaline Phosphatase: 56 U/L (ref 38–126)
Anion gap: 10 (ref 5–15)
BUN: 17 mg/dL (ref 8–23)
CO2: 23 mmol/L (ref 22–32)
Calcium: 9.4 mg/dL (ref 8.9–10.3)
Chloride: 106 mmol/L (ref 98–111)
Creatinine, Ser: 1.56 mg/dL — ABNORMAL HIGH (ref 0.61–1.24)
GFR calc Af Amer: 52 mL/min — ABNORMAL LOW (ref >60)
GFR calc non Af Amer: 45 mL/min — ABNORMAL LOW (ref >60)
Glucose, Bld: 90 mg/dL (ref 70–99)
Potassium: 3.6 mmol/L (ref 3.5–5.1)
Sodium: 139 mmol/L (ref 135–145)
Total Bilirubin: 0.3 mg/dL (ref 0.3–1.2)
Total Protein: 6.2 g/dL — ABNORMAL LOW (ref 6.5–8.1)

## 2018-08-13 LAB — CBC WITH DIFFERENTIAL/PLATELET
Abs Immature Granulocytes: 0.02 10*3/uL (ref 0.00–0.07)
BASOS ABS: 0.1 10*3/uL (ref 0.0–0.1)
Basophils Relative: 1 %
Eosinophils Absolute: 0.4 10*3/uL (ref 0.0–0.5)
Eosinophils Relative: 6 %
HCT: 41.1 % (ref 39.0–52.0)
HEMOGLOBIN: 14 g/dL (ref 13.0–17.0)
Immature Granulocytes: 0 %
Lymphocytes Relative: 24 %
Lymphs Abs: 1.9 10*3/uL (ref 0.7–4.0)
MCH: 28.8 pg (ref 26.0–34.0)
MCHC: 34.1 g/dL (ref 30.0–36.0)
MCV: 84.6 fL (ref 80.0–100.0)
Monocytes Absolute: 0.8 10*3/uL (ref 0.1–1.0)
Monocytes Relative: 11 %
Neutro Abs: 4.6 10*3/uL (ref 1.7–7.7)
Neutrophils Relative %: 58 %
Platelets: 236 10*3/uL (ref 150–400)
RBC: 4.86 MIL/uL (ref 4.22–5.81)
RDW: 12.8 % (ref 11.5–15.5)
WBC: 7.8 10*3/uL (ref 4.0–10.5)
nRBC: 0 % (ref 0.0–0.2)

## 2018-08-13 LAB — GLUCOSE, CAPILLARY
GLUCOSE-CAPILLARY: 85 mg/dL (ref 70–99)
GLUCOSE-CAPILLARY: 217 mg/dL — AB (ref 70–99)
Glucose-Capillary: 104 mg/dL — ABNORMAL HIGH (ref 70–99)
Glucose-Capillary: 222 mg/dL — ABNORMAL HIGH (ref 70–99)

## 2018-08-13 MED ORDER — INSULIN NPH (HUMAN) (ISOPHANE) 100 UNIT/ML ~~LOC~~ SUSP
37.0000 [IU] | Freq: Two times a day (BID) | SUBCUTANEOUS | Status: DC
  Administered 2018-08-13 – 2018-08-16 (×6): 37 [IU] via SUBCUTANEOUS

## 2018-08-13 NOTE — Progress Notes (Signed)
Occupational Therapy Session Note  Patient Details  Name: Jeff [name] MRN: [id number] Date of Birth: Feb 23, 1951  Today's Date: 08/13/2018 OT Individual Time: 1330-1430 OT Individual Time Calculation (min): 60 min    Short Term Goals: Week 1:  OT Short Term Goal 1 (Week 1): Pt will be able to stand at sink to shave with S to support his standing balance. OT Short Term Goal 2 (Week 1): Pt will be able to stand in shower with S to wash bottom with use of grab bars. OT Short Term Goal 3 (Week 1): Pt will be able to stand and pull pants over hips with CGA without UE support.  Skilled Therapeutic Interventions/Progress Updates:  OT intervention with focus on discharge planning, DME requirements, functional transfers, sit<>stand, dynamic standing balance, activity tolerance, and safety awareness to increase independence with BADLs. Pt's granddaughter present.  Pt transitioned to tub room to discuss DME needs.  Pt uses tub/shower at home.  Tub bench demonstrated and pt stated he already has one.  Pt returned to gym and engaged in dynamic standing tasks reaching outside BOS and across midline with RUE to retrieve and place cards on card board.  Pt required CGA increasing to min A with fatigue.  Pt engaged in sit<>stand without RW and static standing without AD requiring CGA increasing to min A with fatigue.  Pt noted how tired he was from therapy today.  Pt returned to room and transferred to recliner.  Pt remained in recliner with all needs within reach and family present, belt alarm activated.  Therapy Documentation Precautions:  Precautions Precautions: Fall Restrictions Weight Bearing Restrictions: No   Pain: Pain Assessment Pain Score: 0-No pain   Therapy/Group: Individual Therapy  Rich Brave 08/13/2018, 2:39 PM

## 2018-08-13 NOTE — Evaluation (Signed)
Occupational Therapy Assessment and Plan  Patient Details  Name: Jeff [name] MRN: [id number] Date of Birth: [birth year]  OT Diagnosis: ataxia and hemiplegia affecting non-dominant side Rehab Potential: Rehab Potential (ACUTE ONLY): Excellent ELOS: 14-18 days   Today's Date: 08/13/2018 OT Individual Time: 1100-1200 OT Individual Time Calculation (min): 60 min     Problem List:  Patient Active Problem List   Diagnosis Date Noted  . Stroke due to embolism of left vertebral artery ([place]) 08/12/2018  . Stroke ([place]) 08/08/2018  . Diabetes ([place]) 07/25/2015  . Neck injury 06/22/2015  . Hearing loss in right ear 10/14/2011  . Agent orange exposure 10/14/2011  . ADD (attention deficit disorder) 10/14/2011  . Hyperlipidemia 10/14/2011  . BMI 28.0-28.9,adult 10/14/2011    Past Medical History:  Past Medical History:  Diagnosis Date  . ADHD (attention deficit hyperactivity disorder)   . Diabetes mellitus   . Hyperlipidemia   . Hypertension    Past Surgical History:  Past Surgical History:  Procedure Laterality Date  . NECK SURGERY    . SPINE SURGERY     Cervical spine x 2; Hardin Negus; [employer].    Assessment & Plan Clinical Impression:  Jeff [name] is a 68 year old male with history of T2DM, HTN, ADHD, whow as admitted to [facility] on 08/08/18 with dizziness, difficulty walking and right facial numbness. CTA head/neck revealed left VA V4 occlusion, superimposed moderate L-VA origin stenosis due to soft plaque, superimposed calcified ICA siphons with moderate left and moderate to severe right siphon stenosis and pseudoarthrosis of lower ACDF with loosening of hardware at C6/C7.  MRI brain revealed new acute nonhemorrhagic punctate infarct left posterolateral medulla and stable atrophy with white matter disease. 2D echo showed EF 60-65% with moderate thickening of AV and no wall abnormality. Dr. Erlinda Hong felt that stroke likely due to L-VA dissection v/s atherosclerosis. Plavix added with recommendations  for DAPT X 3 weeks followed by Plavix alone.  Patient with resultant dizziness, tendency to lean to the left with balance deficits as well as ataxia affecting ADLs and CIR recommended due to functional decline.      Patient transferred to CIR on 08/12/2018 .    Patient currently requires min with basic self-care skills secondary to decreased cardiorespiratoy endurance, ataxia and decreased coordination and decreased sitting balance, decreased standing balance, decreased postural control and decreased balance strategies.  Prior to hospitalization, patient could complete ADLs with modified independent .  Patient will benefit from skilled intervention to increase independence with basic self-care skills and increase level of independence with iADL prior to discharge home with care partner.  Anticipate patient will require intermittent supervision and follow up outpatient.  OT - End of Session Endurance Deficit: Yes OT Assessment Rehab Potential (ACUTE ONLY): Excellent OT Patient demonstrates impairments in the following area(s): Balance;Endurance;Motor;Sensory;Safety OT Basic ADL's Functional Problem(s): Bathing;Dressing;Toileting;Grooming OT Advanced ADL's Functional Problem(s): Light Housekeeping OT Transfers Functional Problem(s): Toilet;Tub/Shower OT Additional Impairment(s): None OT Plan OT Intensity: Minimum of 1-2 x/day, 45 to 90 minutes OT Frequency: 5 out of 7 days OT Duration/Estimated Length of Stay: 14-18 days OT Treatment/Interventions: Balance/vestibular training;Discharge planning;Self Care/advanced ADL retraining;Therapeutic Activities;UE/LE Coordination activities;Functional mobility training;Patient/family education;Therapeutic Exercise;Neuromuscular re-education OT Self Feeding Anticipated Outcome(s): no goal, pt is I OT Basic Self-Care Anticipated Outcome(s): mod I  OT Toileting Anticipated Outcome(s): mod I OT Bathroom Transfers Anticipated Outcome(s): mod I stand pivot or S  if ambulating to bathroom  OT Recommendation Patient destination: Home Follow Up Recommendations: Outpatient OT Equipment Recommended: Tub/shower  bench   Skilled Therapeutic Intervention Pt seen for initial evaluation and ADL training with a focus on body awareness with postural control and balance. Reviewed purpose of OT, OT POC, pt's goals with pt and his family.  Pt anxious to shave and shower.  Pt completed shaving from wc and then a stand pivot to tub bench.  On tub bench, he maintained balance well but did try to stand up 2 x too quickly.  He needed cues to use grab bar as he would try to stand to wash bottom not using hands for support. Transferred to wc to dress.  Min A to stand and balance with pulling clothing over hips.  Pt able to don socks and shoes and tie his shoes.  Pt stated, he does have arthritis and when it "acts up" tying shoes can be difficulty. Pt used RW to do a stand pivot to recliner with min A demonstrating improved control. Pt resting in recliner with belt alarm on and all needs met.  OT Evaluation Precautions/Restrictions  Precautions Precautions: Fall Restrictions Weight Bearing Restrictions: No   Pain Pain Assessment Pain Score: 0-No pain Home Living/Prior Functioning Home Living Family/patient expects to be discharged to:: Private residence Living Arrangements: Spouse/significant other Available Help at Discharge: Family, Available 24 hours/day Type of Home: House Home Access: Stairs to enter, Ramped entrance(3 STE in front without rails, ramped entrance in back) Entrance Stairs-Rails: None Home Layout: One level Bathroom Shower/Tub: Tub/shower unit Additional Comments: Pt normally uses tub shower but in back of house has ramp and fully accessible apartment with 40 in doorways, roll in shower  Lives With: Spouse Prior Function Level of Independence: Independent with transfers, Independent with homemaking with ambulation, Independent with basic ADLs   Able to Take Stairs?: Yes Driving: No Vocation: Retired Comments: Not driving d/t macular retinopathy. enjoys watching westerns ADL  min -mod A  With dynamic balance in standing with self care. From a seated position pt is close S to CGA Vision Baseline Vision/History: No visual deficits(history of macular retinopathy) Patient Visual Report: No change from baseline Vision Assessment?: No apparent visual deficits Perception  Perception: Within Functional Limits Praxis Praxis: Intact Cognition Overall Cognitive Status: Within Functional Limits for tasks assessed Arousal/Alertness: Awake/alert Orientation Level: Person;Place;Situation Person: Oriented Place: Oriented Situation: Oriented Year: 2020 Month: March Day of Week: Correct Memory: Appears intact Immediate Memory Recall: Sock;Blue;Bed Memory Recall: Sock;Blue;Bed Memory Recall Sock: With Cue(stated "shoe") Memory Recall Blue: Without Cue Memory Recall Bed: Without Cue Attention: Focused;Sustained Focused Attention: Appears intact Sustained Attention: Appears intact Safety/Judgment: Impaired Comments: lack of awareness of balance deficits/lateral lean  Sensation Sensation Light Touch: Impaired Detail Central sensation comments: numbness/tingling to left facial area Peripheral sensation comments: peripheral neuropathy with decreased light touch in stocking distribution below the knee Light Touch Impaired Details: Impaired LLE;Impaired RLE Hot/Cold: Appears Intact Proprioception: Impaired by gross assessment Stereognosis: Appears Intact Coordination Gross Motor Movements are Fluid and Coordinated: No Fine Motor Movements are Fluid and Coordinated: No Coordination and Movement Description: impaired L UE and L LE with dysmetric and ataxic movements noted, but able to tie shoes and open containers Heel Shin Test: WFL R LE, impaired L LE Motor  Motor Motor: Ataxia;Abnormal postural alignment and control Motor -  Skilled Clinical Observations: L UE/LE ataxia  Mobility  Bed Mobility Bed Mobility: Rolling Left;Supine to Sit;Sit to Supine Rolling Left: Supervision/Verbal cueing Supine to Sit: Supervision/Verbal cueing Sit to Supine: Supervision/Verbal cueing Transfers Sit to Stand: Minimal Assistance - Patient > 75%  Stand to Sit: Minimal Assistance - Patient > 75%  Trunk/Postural Assessment  Cervical Assessment Cervical Assessment: Within Functional Limits Thoracic Assessment Thoracic Assessment: Within Functional Limits Lumbar Assessment Lumbar Assessment: Exceptions to WFL(posterior pelvic tilt in sitting) Postural Control Postural Control: Deficits on evaluation Trunk Control: increased L lateral lean in standing Righting Reactions: impaired Protective Responses: poor   Balance Balance Balance Assessed: Yes Standardized Balance Assessment Standardized Balance Assessment: Berg Balance Test Berg Balance Test Sit to Stand: Able to stand  independently using hands Standing Unsupported: Unable to stand 30 seconds unassisted Sitting with Back Unsupported but Feet Supported on Floor or Stool: Able to sit 2 minutes under supervision Stand to Sit: Uses backs of legs against chair to control descent Transfers: Needs one person to assist Standing Unsupported with Eyes Closed: Able to stand 3 seconds Standing Ubsupported with Feet Together: Needs help to attain position and unable to hold for 15 seconds From Standing, Reach Forward with Outstretched Arm: Reaches forward but needs supervision From Standing Position, Pick up Object from Floor: Unable to try/needs assist to keep balance From Standing Position, Turn to Look Behind Over each Shoulder: Needs assist to keep from losing balance and falling Turn 360 Degrees: Needs assistance while turning Standing Unsupported, Alternately Place Feet on Step/Stool: Needs assistance to keep from falling or unable to try Standing Unsupported, One Foot in  Front: Loses balance while stepping or standing Standing on One Leg: Unable to try or needs assist to prevent fall Total Score: 12 Static Sitting Balance Static Sitting - Level of Assistance: 5: Stand by assistance Dynamic Sitting Balance Dynamic Sitting - Level of Assistance: 4: Min assist;5: Stand by assistance Static Standing Balance Static Standing - Level of Assistance: 3: Mod assist Dynamic Standing Balance Dynamic Standing - Level of Assistance: 3: Mod assist;2: Max assist Extremity/Trunk Assessment RUE Assessment RUE Assessment: Within Functional Limits LUE Assessment LUE Assessment: Within Functional Limits     Refer to Care Plan for Long Term Goals  Recommendations for other services: None    Discharge Criteria: Patient will be discharged from OT if patient refuses treatment 3 consecutive times without medical reason, if treatment goals not met, if there is a change in medical status, if patient makes no progress towards goals or if patient is discharged from hospital.  The above assessment, treatment plan, treatment alternatives and goals were discussed and mutually agreed upon: by patient and by family  Marquavious Nazar 08/13/2018, 12:23 PM

## 2018-08-13 NOTE — Patient Care Conference (Signed)
Inpatient RehabilitationTeam Conference and Plan of Care Update Date: 08/13/2018   Time: 11:20 Am    Patient Name: Jeff [name]      Medical Record Number: [id number]  Date of Birth: December 11, 1950 Sex: Male         Room/Bed: 4W20C/4W20C-01 Payor Info: Payor: Multimedia programmer / Plan: UHC MEDICARE / Product Type: *No Product type* /    Admitting Diagnosis: l CVA  Admit Date/Time:  08/12/2018  3:49 PM Admission Comments: No comment available   Primary Diagnosis:  <principal problem not specified> Principal Problem: <principal problem not specified>  Patient Active Problem List   Diagnosis Date Noted  . 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 Stroke ([facility]) 08/08/2018  . Diabetes ([facility]) 07/25/2015  . Neck injury 06/22/2015  . Hearing loss in right ear 10/14/2011  . Agent orange exposure 10/14/2011  . ADD (attention deficit disorder) 10/14/2011  . Hyperlipidemia 10/14/2011  . BMI 28.0-28.9,adult 10/14/2011    Expected Discharge Date:    Team Members Present: Physician leading conference: Dr. Claudette Laws Social Worker Present: Dossie Der, LCSW Nurse Present: Ronny Bacon, RN PT Present: Woodfin Ganja, PT OT Present: Ardis Rowan, COTA;Jennifer Katrinka Blazing, OT SLP Present: Feliberto Gottron, SLP PPS Coordinator present : Fae Pippin     Current Status/Progress Goal Weekly Team Focus  Medical   Facial numbness on the left side, left hemiataxia, left lateral portion noted.  Blood pressures are stable  Maintain medical stability reduce fall risk reduce recurrent stroke risk  Initiate rehabilitation program   Bowel/Bladder   LBM 3/9 continent of bowel and bladder.  continue to be continent  Assess toileting needs qshift and PRN   Swallow/Nutrition/ Hydration             ADL's     eval pending        Mobility   supervision bed mobility, min-mod assist transfers, mod-max assist for gait without AD x 109 ft heavy L lateral lean and ataxia   supervision  balance, postural control, coordination, neuro re-ed, awareness   Communication             Safety/Cognition/ Behavioral Observations            Pain   no complaints of pain  pain less than 3  Assess pain qshift and PRN   Skin   No skin impairments   remain free of skin impairments  assess skin qshift and PRN      *See Care Plan and progress notes for long and short-term goals.     Barriers to Discharge  Current Status/Progress Possible Resolutions Date Resolved   Physician    Medical stability     Initial eval's in progress  See above anticipate a longer length of stay due to severe balance issues      Nursing                  PT                    OT                  SLP                SW                Discharge Planning/Teaching Needs:    HOme with wife who is able to assist-wife and daughter here today for his evaluaitons  Team Discussion:  New evaluations and setting goals. Unaware of his deficits so will be a safety risk at discharge. Leans to the left and almost falls before noticing this.  Revisions to Treatment Plan:  New eval    Continued Need for Acute Rehabilitation Level of Care: The patient requires daily medical management by a physician with specialized training in physical medicine and rehabilitation for the following conditions: Daily direction of a multidisciplinary physical rehabilitation program to ensure safe treatment while eliciting the highest outcome that is of practical value to the patient.: Yes Daily medical management of patient stability for increased activity during participation in an intensive rehabilitation regime.: Yes Daily analysis of laboratory values and/or radiology reports with any subsequent need for medication adjustment of medical intervention for : Neurological problems;Renal problems   I attest that I was present, lead the team conference, and concur with the assessment and plan of the team.   Lucy Chris 08/13/2018, 1:37 PM

## 2018-08-13 NOTE — Progress Notes (Signed)
Per nursing, patient was given "Data Collection Information Summary for Patients in Inpatient Rehabilitation Facilities with attached Privacy Act Statement Health Care Records" upon admission.    Patient information reviewed and entered into [facility] System by Becky Emina Ribaudo, PPS coordinator. Information including medical coding, function ability, and quality indicators will be reviewed and updated through discharge.   

## 2018-08-13 NOTE — Progress Notes (Signed)
Social Work  Social Work Assessment and Plan  Patient Details  Name: Jeff [name] MRN: [id number] Date of Birth: 06-Oct-1950  Today's Date: 08/13/2018  Problem List:  Patient Active Problem List   Diagnosis Date Noted  . 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 Stroke ([facility]) 08/08/2018  . Diabetes ([facility]) 07/25/2015  . Neck injury 06/22/2015  . Hearing loss in right ear 10/14/2011  . Agent orange exposure 10/14/2011  . ADD (attention deficit disorder) 10/14/2011  . Hyperlipidemia 10/14/2011  . BMI 28.0-28.9,adult 10/14/2011   Past Medical History:  Past Medical History:  Diagnosis Date  . ADHD (attention deficit hyperactivity disorder)   . Diabetes mellitus   . Hyperlipidemia   . Hypertension    Past Surgical History:  Past Surgical History:  Procedure Laterality Date  . NECK SURGERY    . SPINE SURGERY     Cervical spine x 2; [employer]; [employer].   Social History:  reports that he has never smoked. He has never used smokeless tobacco. He reports that he does not drink alcohol or use drugs.  Family / Support Systems Marital Status: Married Patient Roles: Spouse, Parent, Other (Comment)(church member) Spouse/Significant Other: [name] [phone]  [phone] Children: Daughter in [place] Other Supports: Friends and chruch members Anticipated Caregiver: Wife Ability/Limitations of Caregiver: Wife cares for grandchildren but in good health and can assist pt Caregiver Availability: 24/7 Family Dynamics: Close knit family daughter is here from [place] and supportive. They have friends, church members and community members who are supportive and involved.  Social History Preferred language: English Religion: Baptist Cultural Background: No issues Education: Automotive engineer educated Read: Yes Write: Yes Employment Status: Retired Marine scientist Issues: No issues Guardian/Conservator: None-according to MD pt is capable of making his own  decisions while here, wife plans to be here daily and see him in therapies.   Abuse/Neglect Abuse/Neglect Assessment Can Be Completed: Yes Physical Abuse: Denies Verbal Abuse: Denies Sexual Abuse: Denies Exploitation of patient/patient's resources: Denies Self-Neglect: Denies  Emotional Status Pt's affect, behavior and adjustment status: Pt is motivated and feels like he is dong well, he wants to recover and regain his independence. He has always been one who cared for himself and helped others, not one who needed help himself. His wife confirmes this Recent Psychosocial Issues: other health issues were being managed by multiple MD's Psychiatric History: No history deferred depression screen due to still adjusting to the new unit. But think he would be good to see neuro-psych due to feels may have pseudobulbar affect from CVA. Will make referral for neuro-psych to see while here. Substance Abuse History: No issues  Patient / Family Perceptions, Expectations & Goals Pt/Family understanding of illness & functional limitations: Pt and wife can explain his stroke and deficits, he is making progress which is encouraging to him. Both ahve spoken with the MD's and feel they have a good understanding of treatment plan. He hopes to be here only one week. Premorbid pt/family roles/activities: Husband, father, grandfather, retiree, veteran, church member, etc Anticipated changes in roles/activities/participation: resume Pt/family expectations/goals: Pt states: " I hope to do well and be independent again like I was before this."  Wife states: " We will do whatever he needs for [place] too, but know how independent he is."  [employer]: Other (Comment)([facility] for meds and PCP) Premorbid Home Care/DME Agencies: Other (Comment)(rw, power chair, wc, bsc from mother in-law) Transportation available at discharge: Wife Resource referrals recommended: Neuropsychology, Support  group (specify)  Discharge Planning Living Arrangements: Spouse/significant other Support Systems: Spouse/significant other, Children, Other relatives, Friends/neighbors, Church/faith community Type of Residence: Private residence Insurance Resources: Media planner (specify)(UHC-Medicare) Financial Resources: Social Security, Other (Comment)(pension) Financial Screen Referred: No Living Expenses: Own Money Management: Spouse, Patient Does the patient have any problems obtaining your medications?: No Home Management: Both he and wife Patient/Family Preliminary Plans: Return home with wife who is able to assist him, both do assist their grandchildren after school but wife can assist. Will await therapy evaluations and will work on discharge needs. Very supportive family-daughter here from [place]. Social Work Anticipated Follow Up Needs: HH/OP, Support Group  Clinical Impression Pleasant gentleman who is motivated to do well and recover from this stroke. He has never been ill and in a hospital and this is all new to him. Do feel he would benefit from seeing neuro-psych while here due to feels may have pseudobulbar affect from his stroke. Will make referral, family here day of evaluations.  Lucy Chris 08/13/2018, 1:04 PM

## 2018-08-13 NOTE — Progress Notes (Signed)
[place] PHYSICAL MEDICINE & REHABILITATION PROGRESS NOTE   Subjective/Complaints: Facial numbness moe constant Still with coordination issues in the Left hand  ROS- neg CP, SOB, N/V/D   Objective:   No results found. Recent Labs    08/12/18 0354 08/13/18 0535  WBC 10.7* 7.8  HGB 13.8 14.0  HCT 40.1 41.1  PLT 245 236   Recent Labs    08/12/18 0354 08/13/18 0535  NA 138 139  K 3.4* 3.6  CL 106 106  CO2 23 23  GLUCOSE 108* 90  BUN 15 17  CREATININE 1.25* 1.56*  CALCIUM 9.0 9.4    Intake/Output Summary (Last 24 hours) at 08/13/2018 0947 Last data filed at 08/13/2018 0900 Gross per 24 hour  Intake 240 ml  Output -  Net 240 ml     Physical Exam: Vital Signs Blood pressure 138/75, pulse 85, temperature 97.9 F (36.6 C), temperature source Oral, resp. rate 18, height 5\' 8"  (1.727 m), weight 85.4 kg, SpO2 97 %.   General: No acute distress Mood and affect are appropriate Heart: Regular rate and rhythm no rubs murmurs or extra sounds Lungs: Clear to auscultation, breathing unlabored, no rales or wheezes Abdomen: Positive bowel sounds, soft nontender to palpation, nondistended Extremities: No clubbing, cyanosis, or edema Skin: No evidence of breakdown, no evidence of rash Neurologic: Cranial nerves II through XII intact, motor strength is 5/5 in bilateral deltoid, bicep, tricep, grip, hip flexor, knee extensors, ankle dorsiflexor and plantar flexor Sensory exam normal sensation to light touch  in bilateral upper and lower extremities Reduced Left V1 and V2 Cerebellar exam normal finger to nose to finger as well as heel to shin in right  upper and lower extremities Left side mild hemiataxia Musculoskeletal: Full range of motion in all 4 extremities. No joint swelling   Assessment/Plan: 1. Functional deficits secondary to Left medullary infarct with Left hemiataxia, Left  which require 3+ hours per day of interdisciplinary therapy in a comprehensive inpatient  rehab setting.  Physiatrist is providing close team supervision and 24 hour management of active medical problems listed below.  Physiatrist and rehab team continue to assess barriers to discharge/monitor patient progress toward functional and medical goals  Care Tool:  Bathing              Bathing assist       Upper Body Dressing/Undressing Upper body dressing   What is the patient wearing?: Pull over shirt    Upper body assist      Lower Body Dressing/Undressing Lower body dressing      What is the patient wearing?: Pants     Lower body assist       Toileting Toileting    Toileting assist Assist for toileting: Contact Guard/Touching assist     Transfers Chair/bed transfer  Transfers assist     Chair/bed transfer assist level: Independent     Locomotion Ambulation   Ambulation assist              Walk 10 feet activity   Assist           Walk 50 feet activity   Assist           Walk 150 feet activity   Assist           Walk 10 feet on uneven surface  activity   Assist           Wheelchair     Assist  Wheelchair 50 feet with 2 turns activity    Assist            Wheelchair 150 feet activity     Assist          Medical Problem List and Plan: 1.Functional and mobility deficitssecondary to leftposterolateralmedullary infarct -CIR evals             -waxing and waning left facial sensory loss/dysesthesias might be related to medullary infarct. Certainly are associated with anxiety and could be c/w pseudobulbar affect 2. Antithrombotics: -DVT/anticoagulation:Pharmaceutical:Lovenox -antiplatelet therapy: ASA/Plavix X 3 weeks followed by Plavix alone 3. Pain Management:N/A 4. Mood:LCSW to follow for evaluation and support. -antipsychotic agents: N/A 5. Neuropsych: This patientiscapable of making decisions on hisown  behalf. 6. Skin/Wound Care:LCSW to follow for evaluation and support. 7. Fluids/Electrolytes/Nutrition:Monitor I/O. Check lytes in am.  8. HTN: Monitor BP bid- avoid hypotension. Lisinopril on hold.  9. CKD: Baseline SCr 1.5--> improved to 1.25 10.T2DM-insulin dependent: Hgb A1c-9.6. On metformin 1000 mg bid--d/c due to CKD. Marland KitchenNPH 110 units with breakfast and 40 units with supper. Novolog meal coverage as well as SSI for tighter control.  CBG (last 3)  Recent Labs    08/12/18 1646 08/12/18 2113 08/13/18 0636  GLUCAP 110* 167* 85  Controlled 3/11, reduce pm NPH to reduce risk of  Hypoglycemia                               11. Dyslipidemia: On Crestor.  12. Diabetic gastroparesis: Continue to hold Reglan/Carafate. No N/V during current hospitalization. 13. Leucocytosis: Monitor for fevers or other signs of infection. 14. Mild hypokalemia: Recheck labs in am.    LOS: 1 days A FACE TO FACE EVALUATION WAS PERFORMED  Erick Colace 08/13/2018, 9:47 AM

## 2018-08-13 NOTE — Care Management Note (Signed)
[facility]patient Rehabilitation Center Individual Statement of Services  Patient Name:  Jeff [name]  Date:  08/13/2018  Welcome to the Inpatient Rehabilitation Center.  Our goal is to provide you with an individualized program based on your diagnosis and situation, designed to meet your specific needs.  With this comprehensive rehabilitation program, you will be expected to participate in at least 3 hours of rehabilitation therapies Monday-Friday, with modified therapy programming on the weekends.  Your rehabilitation program will include the following services:   Physical Therapy (PT), Occupational Therapy (OT), 24 hour per day rehabilitation nursing, Therapeutic Recreaction (TR), Neuropsychology, Case Management (Social Worker), Rehabilitation Medicine, Nutrition Services and Pharmacy Services Weekly team conferences will be held on Wednesday to discuss your progress.  Your Social Worker will talk with you frequently to get your input and to update you on team discussions.  Team conferences with you and your family in attendance may also be held.  Expected length of stay: 15-20 days  Overall anticipated outcome: supervision-mod/i level  Depending on your progress and recovery, your program may change. Your Social Worker will coordinate services and will keep you informed of any changes. Your Social Worker's name and contact numbers are listed  below.  The following services may also be recommended but are not provided by the Inpatient Rehabilitation Center:   Driving Evaluations  Home Health Rehabiltiation Services  Outpatient Rehabilitation Services    Arrangements will be made to provide these services after discharge if needed.  Arrangements include referral to agencies that provide these services.  Your insurance has been verified to be:  UHC-Medicare Your primary doctor is:  Norberto Sorenson  Pertinent information will be shared with your doctor and your insurance company.  Social Worker:   Dossie Der, SW [phone] or [phone]  Information discussed with and copy given to patient by: Lucy Chris, 08/13/2018, 1:06 PM

## 2018-08-13 NOTE — Progress Notes (Signed)
Jeff Staggers, MD  Physician  Physical Medicine and Rehabilitation  PMR Pre-admission  Signed  Date of Service:  08/12/2018 12:54 PM       Related encounter: ED to Hosp-Admission (Discharged) from 08/08/2018 in [facility]      Signed         Show:Clear all _0 Manual_1 Template_2 Copied  Added by: _3 Retta Diones, RN_4 Jeff Staggers, MD  _5 Hover for details [employer] PMR Admission Coordinator Pre-Admission Assessment  Patient: Jeff [name] is an 68 y.o., male MRN: [id number] DOB: May 08, 1951 Height: _6  (172.7 cm) Weight: 87.4 kg  Insurance Information HMO:     PPO: Yes     PCP:       IPA:       80/20:       OTHER:   PRIMARY: UHC Medicare      Policy#: [id number]      Subscriber: patient CM Name: Jeff [name]      Phone#: [phone]     Fax#: [phone] Pre-Cert#: [id number] for 7 days with updates due to Celene Skeen at [phone] and fax [phone]      Employer: Not employed Benefits:  Phone #: [phone]     Name: On line portal Eff. Date: 06/04/18     Deduct:  $0      Out of Pocket Max: $4000 (met $131.96)      Life Max: N/A CIR: $160 days 1-10      SNF: $0 days 1-20; $50 days 21-100 Outpatient: medical necessity     Co-Pay: $20/visit Home Health: 100%      Co-Pay: none DME: 80%     Co-Pay: 20% Providers: in network  Medicaid Application Date:       Case Manager:  Disability Application Date:       Case Worker:   Emergency [employer] copy Information    Name Relation Home Work Mobile   [name] Spouse [phone]  [phone]      Current Medical History  Patient Admitting Diagnosis:  L medulla infarct  History of Present Illness: A 68 year old male with history of T2DM, HTN, ADHD, whow as admitted to [facility] on [facility]/08/18 with dizziness, difficulty walking and right facial numbness. CTA head/neck revealed left VA V4 occlusion, superimposed moderate L-VA origin stenosis due to soft  plaque, superimposed calcified ICA siphons with moderate left and moderate to severe right siphon stenosis and pseudoarthrosis of lower ACDF with loosening of hardware at C6/C7. MRI brain revealed new acute nonhemorrhagic punctate infarct left posterolateral medulla and stable atrophy with white matter disease. 2D echo showed EF 60-65% with moderate thickening of AV and no wall abnormality. Dr. Erlinda Hong felt that stroke likely due to L-VA dissection v/s atherosclerosis. Plavix added with recommendations for DAPT X 3 weeks followed by Plavix alone. Patient with resultant dizziness, tendency to lean to the left with balance deficits as well as ataxia affecting ADLs and CIR recommended due to functional decline.   Patients medical record from [facility] has been reviewed by the rehabilitation admission coordinator and physician.  NIH Stroke scale: 2  Past Medical History      Past Medical History:  Diagnosis Date   ADHD (attention deficit hyperactivity disorder)    Diabetes mellitus    Hyperlipidemia    Hypertension     Family History   family history includes Cancer in his brother; Diabetes in his mother; Heart disease in his father and  mother.  Prior Rehab/Hospitalizations Has the patient had major surgery during 100 days prior to admission? No               Current Medications  Current Facility-Administered Medications:     stroke: mapping our early stages of recovery book, , Does not apply, Once, Williams, Jessica N, NP   acetaminophen (TYLENOL) tablet 650 mg, 650 mg, Oral, Q4H PRN **OR** acetaminophen (TYLENOL) solution 650 mg, 650 mg, Per Tube, Q4H PRN **OR** acetaminophen (TYLENOL) suppository 650 mg, 650 mg, Rectal, Q4H PRN, Vonzella Nipple, NP   aspirin EC tablet 81 mg, 81 mg, Oral, Daily, Rosalin Hawking, MD, 81 mg at 08/12/18 0907   calcium carbonate (TUMS - dosed in mg elemental calcium) chewable tablet 400 mg of elemental calcium, 2 tablet, Oral, PRN, Rosalin Hawking, MD   clopidogrel (PLAVIX) tablet 75 mg, 75 mg, Oral, Daily, Rosalin Hawking, MD, 75 mg at 08/12/18 0907   insulin aspart (novoLOG) injection 0-20 Units, 0-20 Units, Subcutaneous, TID WC, Rosalin Hawking, MD, 3 Units at 08/12/18 1239   insulin aspart (novoLOG) injection 0-5 Units, 0-5 Units, Subcutaneous, QHS, Rosalin Hawking, MD, 3 Units at 08/10/18 2146   insulin aspart (novoLOG) injection 6 Units, 6 Units, Subcutaneous, TID WC, Rinehuls, Early Chars, PA-C, 6 Units at 08/11/18 1903   insulin NPH Human (HUMULIN N,NOVOLIN N) injection 40 Units, 40 Units, Subcutaneous, BID AC & HS, Rinehuls, David L, PA-C, 40 Units at 08/12/18 0909   metFORMIN (GLUCOPHAGE) tablet 1,000 mg, 1,000 mg, Oral, BID WC, Rosalin Hawking, MD, 1,000 mg at 08/12/18 0904   pantoprazole (PROTONIX) EC tablet 40 mg, 40 mg, Oral, QHS, Aroor, Sushanth R, MD, 40 mg at 08/11/18 2317   rosuvastatin (CRESTOR) tablet 40 mg, 40 mg, Oral, Daily, Rosalin Hawking, MD, 40 mg at 08/12/18 0907   senna-docusate (Senokot-S) tablet 1 tablet, 1 tablet, Oral, QHS PRN, Vonzella Nipple, NP  Patients Current Diet:      Diet Order                  Diet Carb Modified Fluid consistency: Thin; Room service appropriate? Yes  Diet effective now               Precautions / Restrictions Precautions Precautions: Fall Restrictions Weight Bearing Restrictions: No   Has the patient had 2 or more falls or a fall with injury in the past year?Yes.  Patient reports 3 falls in the past year with bruising/soreness.  Prior Activity Level Limited Community (1-2x/wk): Went out 3 X a week  Prior Functional Level Prior Function Level of Independence: Independent Comments: Not driving d/t neuropathy    Self Care: Did the patient need help bathing, dressing, using the toilet or eating?  Independent.  Wife reports that patient did need help with drawing up his insulin due to decreased vision.  Indoor Mobility: Did the patient need assistance  with walking from room to room (with or without device)? Independent  Stairs: Did the patient need assistance with internal or external stairs (with or without device)? Independent  Functional Cognition: Did the patient need help planning regular tasks such as shopping or remembering to take medications? Independent  Home Assistive Devices / Equipment Home Assistive Devices/Equipment: None Home Equipment: Environmental consultant - 2 wheels, Bedside commode, Shower seat, Crutches, Wheelchair - manual, Wheelchair - power  Prior Device Use: Indicate devices/aids used by the patient prior to current illness, exacerbation or injury? None  Prior Functional Level Comments: Not driving d/t neuropathy  Prior Functional Level Current Functional Level  Bed Mobility Independent Supervision  Transfers Independent Min assist  Mobility - Walk/Wheelchair Independent Mod assist 15" X 3 rail in hallway  Mobility - Ambulation/Gait Independent Mod assist  Upper Body Dressing Independent Sitting, Min guard  Lower Body Dressing Independent Sit to/from stand, Moderate assistance  Grooming Independent Supervision  Eating/Drinking Indpendent Supervision/ safety, Sitting  Toilet Transfer Independent Moderate assistance, RW, Regular Toilet, Ambulation  Bladder Continence WDL Continent, up to bathroom with assist  Bowel Management  WDL Last BM 08/12/18  Stair Climbing Independent Not tested  Communication WDL No difficulties  Memory WDL WDL  Cooking/Meal Prep  Independent      Housework  Independent   Money Management  Independent   Driving  Not driving due to vision changes.  Wife drives.     Special needs/care consideration BiPAP/CPAP No CPM No Continuous Drip IV No  Dialysis No       Life Vest No Oxygen No Special Bed No Trach Size No Wound Vac (area) No       Skin Dry skin                           Bowel mgmt: Last BM 08/12/18 Bladder mgmt: Voiding up in bathroom with assist Diabetic  mgmt Yes, on insulin at home  Previous Home Environment Living Arrangements: Spouse/significant other  Lives With: Spouse Available Help at Discharge: Family, Available 24 hours/day Type of Home: House Home Layout: One level Home Access: Level entry Bathroom Shower/Tub: Chiropodist: Standard Bathroom Accessibility: Yes How Accessible: Accessible via wheelchair, Accessible via walker [place]: No Additional Comments: Pt normally uses tub shower but in back of house has ramp and fully accessible apartment with 40 in doorways, roll in shower  Discharge Living Setting Plans for Discharge Living Setting: Patient's home, House, Lives with (comment)(Lives with wife.) Type of Home at Discharge: House Discharge Home Layout: One level Discharge Home Access: Stairs to enter, Ramped entrance Entrance Stairs-Rails: None Entrance Stairs-Number of Steps: Has 3 steps at the front and a ramp at the back entry Discharge Bathroom Shower/Tub: Tub/shower unit, Curtain Discharge Bathroom Toilet: Handicapped height Discharge Bathroom Accessibility: Yes How Accessible: Accessible via walker(Has several bathrooms to choose from.) Does the patient have any problems obtaining your medications?: No  Social/Family/Support Systems Patient Roles: Spouse, Parent(Has a wife and 2 daughters.) Contact Information: Jeff [name] - wife Anticipated Caregiver: Wife Anticipated Caregiver's Contact Information: [name] - wife - (c) [phone] Ability/Limitations of Caregiver: wife keeps her grand children but is at home Caregiver Availability: 24/7 Discharge Plan Discussed with Primary Caregiver: Yes Is Caregiver In Agreement with Plan?: Yes Does Caregiver/Family have Issues with Lodging/Transportation while Pt is in Rehab?: No  Goals/Additional Needs Patient/Family Goal for Rehab: PT/OT mod I goals Expected length of stay: 7 days Cultural Considerations: [employer] - visits  with his minister Dietary Needs: Carb mod, med cal, thin liquids Equipment Needs: TBD Pt/Family Agrees to Admission and willing to participate: Yes Program Orientation Provided & Reviewed with Pt/Caregiver Including Roles  & Responsibilities: Yes  Patient Condition: I have reviewed medical records from [facility], spoken with attending MD, and patient, wife and daughter. I met with patient at the bedside for inpatient rehabilitation assessment.  Patient will benefit from ongoing PT/OT, can actively participate in 3 hours of therapy a day 5 days of the week, and can make measurable gains during the admission.  Patient will  also benefit from the coordinated team approach during an Inpatient Acute Rehabilitation admission.  The patient will receive intensive therapy as well as Rehabilitation physician, nursing, social worker, and care management interventions.  Due to safety, disease management, medical administration,  patient education the patient requires 24 hour a day rehabilitation nursing.  The patient is currently min to mod assist with mobility and basic ADLs.  Discharge setting and therapy post discharge at home with [facility] or outpatient therapy is anticipated.  Patient has agreed to participate in the Acute Inpatient Rehabilitation Program and will admit today.  Preadmission Screen Completed By:  Retta Diones, 08/12/2018 1:00 PM ______________________________________________________________________   Discussed status with Dr. Naaman Plummer on 08/12/18 at 1326 and received telephone approval for admission today.  Admission Coordinator:  Retta Diones, time 1326/Date 08/12/18   Assessment/Plan: Diagnosis:left medullary infarct 1. Does the need for close, 24 hr/day  Medical supervision in concert with the patient's rehab needs make it unreasonable for this patient to be served in a less intensive setting? Yes 2. Co-Morbidities requiring supervision/potential complications: dm, htn, adhd 3. Due to  bladder management, bowel management, safety, skin/wound care, disease management, medication administration, pain management and patient education, does the patient require 24 hr/day rehab nursing? Yes 4. Does the patient require coordinated care of a physician, rehab nurse, PT (1-2 hrs/day, 5 days/week) and OT (1-2 hrs/day, 5 days/week) to address physical and functional deficits in the context of the above medical diagnosis(es)? Yes Addressing deficits in the following areas: balance, endurance, locomotion, strength, transferring, bowel/bladder control, bathing, dressing, feeding, grooming, toileting and psychosocial support 5. Can the patient actively participate in an intensive therapy program of at least 3 hrs of therapy 5 days a week? Yes 6. The potential for patient to make measurable gains while on inpatient rehab is excellent 7. Anticipated functional outcomes upon discharge from inpatients are: modified independent PT, modified independent OT, n/a SLP 8. Estimated rehab length of stay to reach the above functional goals is: 7 days 9. Anticipated D/C setting: Home 10. Anticipated post D/C treatments: HH therapy and Outpatient therapy 11. Overall Rehab/Functional Prognosis: excellent    RECOMMENDATIONS: This patient's condition is appropriate for continued rehabilitative care in the following setting: CIR Patient has agreed to participate in recommended program. Yes Note that insurance prior authorization may be required for reimbursement for recommended care.  Comment:  Jeff Staggers, MD, [place] [facility] 08/12/2018   Retta Diones 08/12/2018        Revision History

## 2018-08-13 NOTE — Evaluation (Signed)
Physical Therapy Assessment and Plan  Patient Details  Name: Jeff [name] MRN: [id number] Date of Birth: 01/19/1951  PT Diagnosis: Abnormal posture, Ataxia, Difficulty walking and Impaired sensation Rehab Potential: Good ELOS: 18-21 days    Today's Date: 08/13/2018 PT Individual Time: 0901-1015 PT Individual Time Calculation (min): 74 min    Problem List:  Patient Active Problem List   Diagnosis Date Noted  . Stroke due to embolism of left vertebral artery ([place]) 08/12/2018  . Stroke ([place]) 08/08/2018  . Diabetes ([place]) 07/25/2015  . Neck injury 06/22/2015  . Hearing loss in right ear 10/14/2011  . Agent orange exposure 10/14/2011  . ADD (attention deficit disorder) 10/14/2011  . Hyperlipidemia 10/14/2011  . BMI 28.0-28.9,adult 10/14/2011    Past Medical History:  Past Medical History:  Diagnosis Date  . ADHD (attention deficit hyperactivity disorder)   . Diabetes mellitus   . Hyperlipidemia   . Hypertension    Past Surgical History:  Past Surgical History:  Procedure Laterality Date  . NECK SURGERY    . SPINE SURGERY     Cervical spine x 2; Hardin Negus; [employer].    Assessment & Plan Clinical Impression: Patient is a 68 y.o. year old male with history of T2DM, HTN, ADHD, whow as admitted to [facility] on 08/08/18 with dizziness, difficulty walking and right facial numbness. CTA head/neck revealed left VA V4 occlusion, superimposed moderate L-VA origin stenosis due to soft plaque, superimposed calcified ICA siphons with moderate left and moderate to severe right siphon stenosis and pseudoarthrosis of lower ACDF with loosening of hardware at C6/C7. MRI brain revealed new acute nonhemorrhagicpunctate infarct left posterolateral medulla and stable atrophy with white matter disease. 2D echo showed EF 60-65% with moderate thickening of AV and no wall abnormality. Dr. Erlinda Hong felt that stroke likely due to L-VA dissection v/s atherosclerosis. Plavix added with recommendations for DAPT X 3 weeks  followed by Plavix alone. Patient with resultant dizziness, tendency to lean to the left with balance deficits as well as ataxia affecting ADLs and CIR recommended due to functional decline.  Patient transferred to CIR on 08/12/2018 .   Patient currently requires mod with mobility secondary to ataxia and decreased coordination, decreased safety awareness and decreased standing balance, decreased postural control and decreased balance strategies.  Prior to hospitalization, patient was independent  with mobility and lived with Spouse in a House home.  Home access is  Stairs to enter, Ramped entrance(3 STE in front without rails, ramped entrance in back).  Patient will benefit from skilled PT intervention to maximize safe functional mobility, minimize fall risk and decrease caregiver burden for planned discharge intermittent supervision.  Anticipate patient will benefit from follow up OP at discharge.  PT - End of Session Activity Tolerance: Tolerates 30+ min activity with multiple rests Endurance Deficit: Yes PT Assessment Rehab Potential (ACUTE/IP ONLY): Good PT Patient demonstrates impairments in the following area(s): Balance;Endurance;Motor;Safety;Sensory PT Transfers Functional Problem(s): Bed Mobility;Bed to Chair;Car;Furniture;Floor PT Locomotion Functional Problem(s): Ambulation;Stairs PT Plan PT Intensity: Minimum of 1-2 x/day ,45 to 90 minutes PT Frequency: 5 out of 7 days PT Duration Estimated Length of Stay: 18-21 days  PT Treatment/Interventions: Ambulation/gait training;Balance/vestibular training;Cognitive remediation/compensation;Community reintegration;Discharge planning;Neuromuscular re-education;UE/LE Coordination activities;UE/LE Strength taining/ROM;Functional mobility training;Functional electrical stimulation;Psychosocial support;Therapeutic Exercise;Therapeutic Activities;Patient/family education;DME/adaptive equipment instruction;Disease management/prevention;Pain  management;Stair training;Visual/perceptual remediation/compensation PT Transfers Anticipated Outcome(s): supervision  PT Locomotion Anticipated Outcome(s): supervision  PT Recommendation Recommendations for Other Services: Therapeutic Recreation consult;Neuropsych consult Therapeutic Recreation Interventions: Outing/community reintergration Follow Up Recommendations: Outpatient PT Patient destination: Home  Equipment Recommended: To be determined  Skilled Therapeutic Intervention Evaluation completed (see details above and below) with education on PT POC and goals and individual treatment initiated with focus on functional mobility, balance, safety awareness and gait. Pt supine in bed upon PT arrival, agreeable to therapy tx and denies pain. Pt transferred to sitting EOB with supervision and performed stand pivot to w/c with mod assist. Pt sitting in w/c at the sink brushed teeth, washed face and brushed hair all with set up assist. Pt transported to the gym in w/c. Pt ambulated x 109 ft this session without AD and mod-max assist for correction of LOB, pt demonstrated ataxic gait pattern with wide BOS and increased L latera lean. Pt ascended/descended 12 steps this session with B rails and mod assist, reciprocal pattern ascending and step to pattern descending, L LE ataxia noted with cues for safety and techniques. Pt transported to rehab apartment, stand pivot to bed with min assist. Pt performed bed mobility with supervision. Pt transported to ortho gym and performed car transfer with mod assist stand pivot. Pt transported back to gym. Pt performed squat pivot to mat with min assist. Pt participated in the beg balance test this session, scored 12/56 as detailed below. Pt with multiple LOB episodes during test requiring a significant amount of assist to correct or to assist to sitting on mat. Pt transported back to room and left in w/c with chair alarm set and family present.    PT  Evaluation Precautions/Restrictions Precautions Precautions: Fall Restrictions Weight Bearing Restrictions: No General   Vital Signs  Pain Pain Assessment Pain Scale: 0-10 Pain Score: 0-No pain Home Living/Prior Functioning Home Living Available Help at Discharge: Family;Available 24 hours/day Type of Home: House Home Access: Stairs to enter;Ramped entrance(3 STE in front without rails, ramped entrance in back) Entrance Stairs-Rails: None Home Layout: One level  Lives With: Spouse Prior Function Level of Independence: Independent with transfers;Independent with homemaking with ambulation;Independent with basic ADLs  Able to Take Stairs?: Yes Driving: No Vocation: Retired Comments: Not driving d/t neuropathy. enjoys watching westerns Cognition Overall Cognitive Status: Within Functional Limits for tasks assessed Orientation Level: Oriented X4 Attention: Focused;Sustained Focused Attention: Appears intact Sustained Attention: Appears intact Memory: Appears intact Safety/Judgment: Impaired Comments: lack of awareness of balance deficits/lateral lean  Sensation Sensation Light Touch: Impaired Detail Central sensation comments: numbness/tingling to left facial area Peripheral sensation comments: peripheral neuropathy with decreased light touch in stocking distribution below the knee Light Touch Impaired Details: Impaired LLE;Impaired RLE Hot/Cold: Not tested Proprioception: Impaired by gross assessment Stereognosis: Not tested Coordination Gross Motor Movements are Fluid and Coordinated: No Fine Motor Movements are Fluid and Coordinated: No Coordination and Movement Description: impaired L UE and L LE with dysmetric and ataxic movements noted Heel Shin Test: WFL R LE, impaired L LE Motor  Motor Motor: Ataxia;Abnormal postural alignment and control Motor - Skilled Clinical Observations: L UE/LE ataxia   Mobility Bed Mobility Bed Mobility: Rolling Left;Supine to  Sit;Sit to Supine Rolling Left: Supervision/Verbal cueing Supine to Sit: Supervision/Verbal cueing Sit to Supine: Supervision/Verbal cueing Transfers Transfers: Sit to Stand;Stand to Sit;Stand Pivot Transfers Sit to Stand: Minimal Assistance - Patient > 75% Stand to Sit: Minimal Assistance - Patient > 75% Stand Pivot Transfers: Minimal Assistance - Patient > 75%;Moderate Assistance - Patient 50 - 74% Stand Pivot Transfer Details: Verbal cues for technique;Verbal cues for precautions/safety;Visual cues/gestures for sequencing Transfer (Assistive device): None Locomotion  Gait Ambulation: Yes Gait Assistance: Moderate Assistance - Patient 50-74%;Maximal  Assistance - Patient 25-49% Gait Distance (Feet): 109 Feet Assistive device: None Gait Assistance Details: Tactile cues for weight shifting;Tactile cues for posture;Tactile cues for weight beaing;Verbal cues for technique;Verbal cues for precautions/safety;Manual facilitation for weight shifting Gait Gait: Yes Gait Pattern: Impaired Gait Pattern: Step-to pattern;Wide base of support;Lateral trunk lean to left;Ataxic;Decreased weight shift to right Gait velocity: decreased Stairs / Additional Locomotion Stairs: Yes Stairs Assistance: Moderate Assistance - Patient 50 - 74% Stair Management Technique: Two rails;Step to pattern;Alternating pattern Number of Stairs: 12 Height of Stairs: 6  Trunk/Postural Assessment  Cervical Assessment Cervical Assessment: Within Functional Limits Thoracic Assessment Thoracic Assessment: Within Functional Limits Lumbar Assessment Lumbar Assessment: Exceptions to WFL(posterior pelvic tilt in sitting) Postural Control Postural Control: Deficits on evaluation Trunk Control: increased L lateral lean in standing Righting Reactions: impaired Protective Responses: poor   Balance Balance Balance Assessed: Yes Standardized Balance Assessment Standardized Balance Assessment: Berg Balance Test Berg Balance  Test Sit to Stand: Able to stand  independently using hands Standing Unsupported: Unable to stand 30 seconds unassisted Sitting with Back Unsupported but Feet Supported on Floor or Stool: Able to sit 2 minutes under supervision Stand to Sit: Uses backs of legs against chair to control descent Transfers: Needs one person to assist Standing Unsupported with Eyes Closed: Able to stand 3 seconds Standing Ubsupported with Feet Together: Needs help to attain position and unable to hold for 15 seconds From Standing, Reach Forward with Outstretched Arm: Reaches forward but needs supervision From Standing Position, Pick up Object from Floor: Unable to try/needs assist to keep balance From Standing Position, Turn to Look Behind Over each Shoulder: Needs assist to keep from losing balance and falling Turn 360 Degrees: Needs assistance while turning Standing Unsupported, Alternately Place Feet on Step/Stool: Needs assistance to keep from falling or unable to try Standing Unsupported, One Foot in Front: Loses balance while stepping or standing Standing on One Leg: Unable to try or needs assist to prevent fall Total Score: 12 Static Sitting Balance Static Sitting - Level of Assistance: 5: Stand by assistance Dynamic Sitting Balance Dynamic Sitting - Level of Assistance: 4: Min assist;5: Stand by assistance Static Standing Balance Static Standing - Level of Assistance: 3: Mod assist Dynamic Standing Balance Dynamic Standing - Level of Assistance: 3: Mod assist;2: Max assist Extremity Assessment  RLE Assessment RLE Assessment: Within Functional Limits LLE Assessment LLE Assessment: Exceptions to [facility]    Refer to Care Plan for Long Term Goals  Recommendations for other services: Neuropsych and Therapeutic Recreation  Outing/community reintegration  Discharge Criteria: Patient will be discharged from PT if patient refuses treatment 3 consecutive times without medical reason, if treatment goals not  met, if there is a change in medical status, if patient makes no progress towards goals or if patient is discharged from hospital.  The above assessment, treatment plan, treatment alternatives and goals were discussed and mutually agreed upon: by patient and by family  Netta Corrigan, PT, DPT 08/13/2018, 10:58 AM

## 2018-08-13 NOTE — Telephone Encounter (Signed)
Please advise. Dgaddy, CMA 

## 2018-08-14 ENCOUNTER — Inpatient Hospital Stay (HOSPITAL_COMMUNITY): Payer: Medicare Other

## 2018-08-14 ENCOUNTER — Inpatient Hospital Stay (HOSPITAL_COMMUNITY): Payer: Self-pay | Admitting: Physical Therapy

## 2018-08-14 LAB — GLUCOSE, CAPILLARY
GLUCOSE-CAPILLARY: 215 mg/dL — AB (ref 70–99)
Glucose-Capillary: 144 mg/dL — ABNORMAL HIGH (ref 70–99)
Glucose-Capillary: 121 mg/dL — ABNORMAL HIGH (ref 70–99)
Glucose-Capillary: 57 mg/dL — ABNORMAL LOW (ref 70–99)
Glucose-Capillary: 136 mg/dL — ABNORMAL HIGH (ref 70–99)

## 2018-08-14 NOTE — Progress Notes (Signed)
[place] PHYSICAL MEDICINE & REHABILITATION PROGRESS NOTE   Subjective/Complaints: Upper facial numbness persists No bladder or bowel issues  ROS- neg CP, SOB, N/V/D   Objective:   No results found. Recent Labs    08/12/18 0354 08/13/18 0535  WBC 10.7* 7.8  HGB 13.8 14.0  HCT 40.1 41.1  PLT 245 236   Recent Labs    08/12/18 0354 08/13/18 0535  NA 138 139  K 3.4* 3.6  CL 106 106  CO2 23 23  GLUCOSE 108* 90  BUN 15 17  CREATININE 1.25* 1.56*  CALCIUM 9.0 9.4    Intake/Output Summary (Last 24 hours) at 08/14/2018 1324 Last data filed at 08/13/2018 1700 Gross per 24 hour  Intake 420 ml  Output -  Net 420 ml     Physical Exam: Vital Signs Blood pressure 110/77, pulse 71, temperature 97.9 F (36.6 C), temperature source Oral, resp. rate 18, height 5\' 8"  (1.727 m), weight 85.4 kg, SpO2 100 %.   General: No acute distress Mood and affect are appropriate Heart: Regular rate and rhythm no rubs murmurs or extra sounds Lungs: Clear to auscultation, breathing unlabored, no rales or wheezes Abdomen: Positive bowel sounds, soft nontender to palpation, nondistended Extremities: No clubbing, cyanosis, or edema Skin: No evidence of breakdown, no evidence of rash Neurologic: Cranial nerves II through XII intact, motor strength is 5/5 in bilateral deltoid, bicep, tricep, grip, hip flexor, knee extensors, ankle dorsiflexor and plantar flexor Sensory exam normal sensation to light touch  in bilateral upper and lower extremities Reduced Left V1 and V2 Cerebellar exam normal finger to nose to finger as well as heel to shin in right  upper and lower extremities Left side mild hemiataxia unchanged Musculoskeletal: Full range of motion in all 4 extremities. No joint swelling   Assessment/Plan: 1. Functional deficits secondary to Left medullary infarct with Left hemiataxia, Left  which require 3+ hours per day of interdisciplinary therapy in a comprehensive inpatient rehab  setting.  Physiatrist is providing close team supervision and 24 hour management of active medical problems listed below.  Physiatrist and rehab team continue to assess barriers to discharge/monitor patient progress toward functional and medical goals  Care Tool:  Bathing    Body parts bathed by patient: Right arm, Left arm, Chest, Abdomen, Front perineal area, Buttocks, Right upper leg, Left upper leg, Right lower leg, Left lower leg, Face         Bathing assist Assist Level: Minimal Assistance - Patient > 75%     Upper Body Dressing/Undressing Upper body dressing   What is the patient wearing?: Pull over shirt    Upper body assist Assist Level: Set up assist    Lower Body Dressing/Undressing Lower body dressing      What is the patient wearing?: Underwear/pull up, Pants     Lower body assist Assist for lower body dressing: Moderate Assistance - Patient 50 - 74%(for balance)     Toileting Toileting    Toileting assist Assist for toileting: Contact Guard/Touching assist     Transfers Chair/bed transfer  Transfers assist     Chair/bed transfer assist level: Moderate Assistance - Patient 50 - 74%     Locomotion Ambulation   Ambulation assist      Assist level: Maximal Assistance - Patient 25 - 49% Assistive device: (none) Max distance: 109 ft   Walk 10 feet activity   Assist     Assist level: Maximal Assistance - Patient 25 - 49%  Walk 50 feet activity   Assist    Assist level: Maximal Assistance - Patient 25 - 49%      Walk 150 feet activity   Assist Walk 150 feet activity did not occur: Safety/medical concerns         Walk 10 feet on uneven surface  activity   Assist Walk 10 feet on uneven surfaces activity did not occur: Safety/medical concerns         Wheelchair     Assist Will patient use wheelchair at discharge?: No             Wheelchair 50 feet with 2 turns activity    Assist             Wheelchair 150 feet activity     Assist          Medical Problem List and Plan: 1.Functional and mobility deficitssecondary to leftposterolateralmedullary infarct -CIR PT, OT             -waxing and waning left facial sensory loss/dysesthesias might be related to medullary infarct. Certainly are associated with anxiety and could be c/w pseudobulbar affect 2. Antithrombotics: -DVT/anticoagulation:Pharmaceutical:Lovenox -antiplatelet therapy: ASA/Plavix X 3 weeks followed by Plavix alone 3. Pain Management:N/A 4. Mood:LCSW to follow for evaluation and support. -antipsychotic agents: N/A 5. Neuropsych: This patientiscapable of making decisions on hisown behalf. 6. Skin/Wound Care:LCSW to follow for evaluation and support. 7. Fluids/Electrolytes/Nutrition:Monitor I/O. Check lytes in am.  8. HTN: Monitor BP bid- avoid hypotension. Lisinopril on hold.  9. CKD: Baseline SCr 1.5--> improved to 1.25 10.T2DM-insulin dependent: Hgb A1c-9.6. On metformin 1000 mg bid--d/c due to CKD. Marland KitchenNPH 110 units with breakfast and 40 units with supper. Novolog meal coverage as well as SSI for tighter control.  CBG (last 3)  Recent Labs    08/13/18 1701 08/13/18 2110 08/14/18 0617  GLUCAP 222* 217* 144*  Controlled 3/12, reduced pm NPH to reduce risk of  Hypoglycemia    improved                           11. Dyslipidemia: On Crestor.  12. Diabetic gastroparesis: Continue to hold Reglan/Carafate. No N/V during current hospitalization.monitor for recurrence 13. Leucocytosis: Monitor for fevers or other signs of infection. 14. Mild hypokalemia: Recheck labs in am.    LOS: 2 days A FACE TO FACE EVALUATION WAS PERFORMED  Erick Colace 08/14/2018, 9:09 AM

## 2018-08-14 NOTE — Progress Notes (Signed)
Occupational Therapy Session Note  Patient Details  Name: Jeff [name] MRN: [id number] Date of Birth: 1951/02/22  Today's Date: 08/14/2018 OT Individual Time: 1300-1345 OT Individual Time Calculation (min): 45 min    Short Term Goals: Week 1:  OT Short Term Goal 1 (Week 1): Pt will be able to stand at sink to shave with S to support his standing balance. OT Short Term Goal 2 (Week 1): Pt will be able to stand in shower with S to wash bottom with use of grab bars. OT Short Term Goal 3 (Week 1): Pt will be able to stand and pull pants over hips with CGA without UE support.  Skilled Therapeutic Interventions/Progress Updates:   Pt in bed awaiting OT arrival with daughter present. Pt transferred EOB> w/c with MIN A. Pt transported to gym for time management. Pt participate in functional sit>stand training to facilitate independence with toilet transfers. Incorporated mirror to provide visual feedback to decrease lateral lean to L. Pt complete ~10 reps using mirror appropriately. Increased challenge by instructing pt to reach forward to remove cards from mirror during sit>stand. Pt require MIN A to maintain balance during activity. Facilitated dynamic standing balance by having pt stand from mat table with RW and retrieve bean bags placed laterally on mat. Instructed pt to toss each bean bag forward into bucket to challenge dynamic standing balance. Pt require x3 rest break due to fatigue.  Instructed pt to stand with RW and march in place in preparation for advancing functional mobility. Pt require MIN A to maintain balance and reports increased fatigue due to task.  Pt transported back to room and left up in bed with alarm bed on, call bed in reach and all needs met.   Therapy Documentation Precautions:  Precautions Precautions: Fall Restrictions Weight Bearing Restrictions: No General:   Vital Signs: Therapy Vitals Temp: (!) 97.4 F (36.3 C) Pulse Rate: 81 Resp: 16 BP: 122/81 Oxygen  Therapy SpO2: 99 % Pain: Pt reports no pain during session.   Therapy/Group: Individual Therapy  Ihor Gully 08/14/2018, 1:55 PM

## 2018-08-14 NOTE — Progress Notes (Signed)
Occupational Therapy Session Note  Patient Details  Name: Jeff [name] MRN: [id number] Date of Birth: 08-23-1950  Today's Date: 08/14/2018 OT Individual Time: 1093-2355 OT Individual Time Calculation (min): 75 min    Short Term Goals: Week 1:  OT Short Term Goal 1 (Week 1): Pt will be able to stand at sink to shave with S to support his standing balance. OT Short Term Goal 2 (Week 1): Pt will be able to stand in shower with S to wash bottom with use of grab bars. OT Short Term Goal 3 (Week 1): Pt will be able to stand and pull pants over hips with CGA without UE support.  Skilled Therapeutic Interventions/Progress Updates:   OT intervention with focus on BADL retraining, functional transfers, sit<>stand, standing balance, activity tolerance, and safety awareness to increase independence with BADLs. Pt requires min A for stand pivot transfers.  Pt completed bathing tasks with sit<>stand in shower using grab bar.  Pt completed bathing/dressing with CGA when standing at sink for LB dressing. Pt transitioned to gym and engaged in standing tasks for BUE table task.  Pt required CGA for standing balance. Pt hesitant to incorporate BUE in task, preferring to have on UE as support when standing.  Pt encourage to use BUE during task.  Pt returned to room and remained in recliner with all needs within reach and belt alarm activated.   Therapy Documentation Precautions:  Precautions Precautions: Fall Restrictions Weight Bearing Restrictions: No   Pain:  Pt denies pain   Therapy/Group: Individual Therapy  Rich Brave 08/14/2018, 11:18 AM

## 2018-08-14 NOTE — Progress Notes (Signed)
Patient had an episode of his left upper side going "numb " and "tingly". Per daughter it happens more on his face but it is the first time he had it on his arm. Patient was emotional and was crying. Pam PA went to see patient and reassured patient. CBG was done at this time too. Continued to monitor patient.

## 2018-08-14 NOTE — Progress Notes (Signed)
Physical Therapy Session Note  Patient Details  Name: Jeff [name] MRN: [id number] Date of Birth: Mar 04, 1951  Today's Date: 08/14/2018 PT Individual Time: 8811-0315 PT Individual Time Calculation (min): 65 min   Short Term Goals: Week 1:  PT Short Term Goal 1 (Week 1): Pt will ambulate x 150 ft without AD and up to mod assist for correction of LOB during gait PT Short Term Goal 2 (Week 1): Pt will perform bed<>chair transfer consistently with min assist PT Short Term Goal 3 (Week 1): Pt will ambulate steps with B rails and min assist   Skilled Therapeutic Interventions/Progress Updates:   Pt in w/c and agreeable to therapy, no c/o pain. Ambulated to therapy gym w/ RW and min-mod assist, manual facilitation of lateral weight shifting and verbal cues for gait pattern/step length. Required 1 seated rest break 2/2 fatigue. Worked on postural control in stance w/ bilateral clothspin reaching activities emphasizing R weight shifting. Performed LLE 2" step taps to work on R weight shifting w/ mirror visual feedback for midline orientation and verbal cues to weight shift. 50+ reps to work on blocked practice. Achieving adequate R weight shifting ~40-50% of the time. Needed up to max assist to correct LOB by returning to sitting on mat, all LOB to R side. Transitioned to quadruped on mat w/ min assist and performed bird dog, alternating UEs and LEs to work on core strengthening and to address global ataxia. Ambulated 14' w/o AD, max assist, to assess for carryover of standing balance tasks performed in standing. Ambulated back to room via same assist and cues as detailed above w/ RW. Improved R weight shifting, however increased fatigue affecting his ability to sustain. Ended session in supine, all needs in reach.   Therapy Documentation Precautions:  Precautions Precautions: Fall Restrictions Weight Bearing Restrictions: No Vital Signs: Therapy Vitals Temp: (!) 97.4 F (36.3 C) Pulse Rate: 81 Resp:  16 BP: 122/81 Oxygen Therapy SpO2: 99 %  Therapy/Group: Individual Therapy  Padme Arriaga Melton Krebs 08/14/2018, 5:01 PM

## 2018-08-14 NOTE — Progress Notes (Signed)
Hypoglycemic Event  CBG: 57  Treatment: meal; sprite  Symptoms: dizziness  Follow-up CBG: Time:1227 CBG Result:121  Possible Reasons for Event: meds; meal  Comments/MD notified:Pam Lavis PA    Shirlee Latch, Levonne Hubert

## 2018-08-15 ENCOUNTER — Inpatient Hospital Stay (HOSPITAL_COMMUNITY): Payer: Medicare Other

## 2018-08-15 ENCOUNTER — Inpatient Hospital Stay (HOSPITAL_COMMUNITY): Payer: Medicare Other | Admitting: Physical Therapy

## 2018-08-15 LAB — GLUCOSE, CAPILLARY
GLUCOSE-CAPILLARY: 153 mg/dL — AB (ref 70–99)
Glucose-Capillary: 147 mg/dL — ABNORMAL HIGH (ref 70–99)
Glucose-Capillary: 292 mg/dL — ABNORMAL HIGH (ref 70–99)
Glucose-Capillary: 293 mg/dL — ABNORMAL HIGH (ref 70–99)
Glucose-Capillary: 249 mg/dL — ABNORMAL HIGH (ref 70–99)
Glucose-Capillary: 188 mg/dL — ABNORMAL HIGH (ref 70–99)

## 2018-08-15 NOTE — Progress Notes (Signed)
Physical Therapy Session Note  Patient Details  Name: Jeff [name] MRN: [id number] Date of Birth: 03-22-1951  Today's Date: 08/15/2018 PT Individual Time: 6546-5035 PT Individual Time Calculation (min): 60 min   Short Term Goals: Week 1:  PT Short Term Goal 1 (Week 1): Pt will ambulate x 150 ft without AD and up to mod assist for correction of LOB during gait PT Short Term Goal 2 (Week 1): Pt will perform bed<>chair transfer consistently with min assist PT Short Term Goal 3 (Week 1): Pt will ambulate steps with B rails and min assist   Skilled Therapeutic Interventions/Progress Updates:   Missed 15 min of skilled PT at beginning of session as pt was speaking w/ PA. Pt in recliner and agreeable to therapy, no c/o pain. Ambulated to/from therapy gym w/ min assist using RW. Worked on postural control in standing and gait w/o RW this session. Standing postural control focusing on R weight shifting while performing RUE reaching tasks during static stance and during sit<>stand transition. Manual block at R foot and stood by wall for manual feedback of midline orientation. Also performed LLE step taps to 2" step w/ improved R lateral weight shifting this session compared to yesterday. Ambulated 100' x3 w/o AD, min-mod assist overall. Good carryover from previous sessions with this therapist. Min manual cues for lateral weight shifting and verbal cues for step length. Pt w/ 1 episode of feeling lightheaded in middle of session. Pt stated he needed to lay down. Vitals WNL, RN present assessing blood sugar which was also WNL. Pt felt better after a few min of rest and deep breathing. No episodes remainder of session. Returned to room and trained daughter on providing hands-on transfers, bed<>chair and chair<>toilet. Pt's daughter returned demonstration safely and w/o cues from therapist. Both pt and daughter verbalize understanding that pt is unsafe to stand w/o someone right next to him and this includes when  toileting in presence of his daughter. Ended session in supine, all needs in reach.   Therapy Documentation Precautions:  Precautions Precautions: Fall Restrictions Weight Bearing Restrictions: No General: PT Amount of Missed Time (min): 15 Minutes PT Missed Treatment Reason: Unavailable (Comment)(PA speaking w/ pt and family) Vital Signs: Therapy Vitals Temp: 97.7 F (36.5 C) Temp Source: Oral Pulse Rate: 83 Resp: 18 BP: 134/78 Patient Position (if appropriate): Sitting Oxygen Therapy SpO2: 96 % O2 Device: Room Air  Therapy/Group: Individual Therapy  Jeff [name] 08/15/2018, 5:27 PM

## 2018-08-15 NOTE — IPOC Note (Signed)
Overall Plan of Care [facility]) Patient Details Name: Jeff [name] MRN: [id number] DOB: [birth year]  Admitting Diagnosis: <principal problem not specified>  Hospital Problems: Active Problems:   Stroke due to embolism of left vertebral artery (HCC)     Functional Problem List: Nursing Medication Management, Safety, Endurance  PT Balance, Endurance, Motor, Safety, Sensory  OT Balance, Endurance, Motor, Sensory, Safety  SLP    TR         Basic ADL's: OT Bathing, Dressing, Toileting, Grooming     Advanced  ADL's: OT Light Housekeeping     Transfers: PT Bed Mobility, Bed to Chair, Car, Furniture, Civil Service fast streamer, Research scientist (life sciences): PT Ambulation, Stairs     Additional Impairments: OT None  SLP        TR      Anticipated Outcomes Item Anticipated Outcome  Self Feeding no goal, pt is I  Swallowing      Basic self-care  mod I   Toileting  mod I   Bathroom Transfers mod I stand pivot or S if ambulating to bathroom   Bowel/Bladder     Transfers  supervision   Locomotion  supervision   Communication     Cognition     Pain     Safety/Judgment  maintain safety with cues/reminders   Therapy Plan: PT Intensity: Minimum of 1-2 x/day ,45 to 90 minutes PT Frequency: 5 out of 7 days PT Duration Estimated Length of Stay: 18-21 days  OT Intensity: Minimum of 1-2 x/day, 45 to 90 minutes OT Frequency: 5 out of 7 days OT Duration/Estimated Length of Stay: 14-18 days      Team Interventions: Nursing Interventions Discharge Planning, Patient/Family Education, Medication Management, Disease Management/Prevention  PT interventions Ambulation/gait training, Warden/ranger, Cognitive remediation/compensation, Community reintegration, Discharge planning, Neuromuscular re-education, UE/LE Coordination activities, UE/LE Strength taining/ROM, Functional mobility training, Functional electrical stimulation, Psychosocial support, Therapeutic Exercise,  Therapeutic Activities, Patient/family education, DME/adaptive equipment instruction, Disease management/prevention, Pain management, Stair training, Visual/perceptual remediation/compensation  OT Interventions Balance/vestibular training, Discharge planning, Self Care/advanced ADL retraining, Therapeutic Activities, UE/LE Coordination activities, Functional mobility training, Patient/family education, Therapeutic Exercise, Neuromuscular re-education  SLP Interventions    TR Interventions    SW/CM Interventions Discharge Planning, Psychosocial Support, Patient/Family Education   Barriers to Discharge MD  Medical stability  Nursing      PT      OT      SLP      SW       Team Discharge Planning: Destination: PT-Home ,OT- Home , SLP-  Projected Follow-up: PT-Outpatient PT, OT-  Outpatient OT, SLP-  Projected Equipment Needs: PT-To be determined, OT- Tub/shower bench, SLP-  Equipment Details: PT- , OT-  Patient/family involved in discharge planning: PT- Patient, Family member/caregiver,  OT-Family member/caregiver, Patient, SLP-   MD ELOS: 10-14d Medical Rehab Prognosis:  Excellent Assessment:  68 year old male with history of T2DM, HTN, ADHD, whow as admitted to [facility] on 08/08/18 with dizziness, difficulty walking and right facial numbness. CTA head/neck revealed left VA V4 occlusion, superimposed moderate L-VA origin stenosis due to soft plaque, superimposed calcified ICA siphons with moderate left and moderate to severe right siphon stenosis and pseudoarthrosis of lower ACDF with loosening of hardware at C6/C7. MRI brain revealed new acute nonhemorrhagicpunctate infarct left posterolateral medulla and stable atrophy with white matter disease. 2D echo showed EF 60-65% with moderate thickening of AV and no wall abnormality. Dr. Roda Shutters felt that stroke likely due to L-VA dissection  v/s atherosclerosis. Plavix added with recommendations for DAPT X 3 weeks followed by Plavix alone  Now requiring  24/7 Rehab RN,MD, as well as CIR level PT, OT  Treatment team will focus on ADLs and mobility with goals set at ModifiedI/Supervision  See Team Conference Notes for weekly updates to the plan of care

## 2018-08-15 NOTE — Consult Note (Signed)
Ophthalmology Initial Consult Note  Jeff [name], Jeff [name], 68 y.o. male Date of Service:  08/15/2018  Requesting physician: Erick Colace, MD  Information Obtained from: patient Chief Complaint:  Pressure behind OS  HPI/Discussion:  Jeff [name] is a 68 y.o. male with POHx of NPDR and PMHx of recent CVA in rehab complaining of pressure behind OS. VA is a little blurry, but at baseline. Denies pain in the eye itself. Denies redness. Denies flashes/floaters/curtains. Denies photophobia. Patient of Dr. Marchelle Gearing (ophthalmology).  Past Ocular Hx:  NPDR, pseudophakia OU, PVD OD, rebound iritis OS (presumably s/p CE/IOL) Ocular Meds:  None Family ocular history: Noncontributory  Past Medical History:  Diagnosis Date  . ADHD (attention deficit hyperactivity disorder)   . Diabetes mellitus   . Hyperlipidemia   . Hypertension    Past Surgical History:  Procedure Laterality Date  . NECK SURGERY    . SPINE SURGERY     Cervical spine x 2; [employer]; [employer].    Prior to Admission Meds: Medications Prior to Admission  Medication Sig Dispense Refill Last Dose  . aspirin EC 81 MG tablet Take 1 tablet (81 mg total) by mouth daily. 365 tablet 0 08/07/2018 at Unknown time  . calcium carbonate (TUMS EX) 750 MG chewable tablet Chew 2 tablets by mouth as needed for heartburn.   unk  . glucose blood (ONETOUCH VERIO) test strip 1 each 2 (two) times daily by Other route. And lancets 2/day 100 each 12 Taking  . insulin NPH Human (NOVOLIN N) 100 UNIT/ML injection Inject 1.1 mLs (110 Units total) every morning into the skin. And syringes 2/day 110 mL 11 08/07/2018 at Unknown time  . Insulin Syringe-Needle U-100 (B-D INS SYR ULTRAFINE 1CC/31G) 31G X 5/16" 1 ML MISC Used to inject insulin twice daily. 90 each 2 Taking  . lisinopril (PRINIVIL,ZESTRIL) 10 MG tablet TAKE 1 TABLET(10 MG) BY MOUTH DAILY 90 tablet 1 08/07/2018 at Unknown time  . metFORMIN (GLUCOPHAGE) 1000 MG tablet Take 1 tablet (1,000 mg total) by mouth 2  (two) times daily with a meal. 180 tablet 1 08/07/2018 at am  . metoCLOPramide (REGLAN) 5 MG tablet Take 1 tablet (5 mg total) by mouth 3 (three) times daily before meals. 60 tablet 0 08/07/2018 at Unknown time  . omeprazole (PRILOSEC) 40 MG capsule Take 1 capsule (40 mg total) by mouth daily. 30 minutes before breakfast 30 capsule 3 08/06/2018  . rosuvastatin (CRESTOR) 20 MG tablet Take 1 tablet (20 mg total) by mouth daily. 90 tablet 3 08/06/2018  . Semaglutide (OZEMPIC, 1 MG/DOSE, [place]) Inject 1 mg into the skin once a week. On Sunday   08/03/2018  . sucralfate (CARAFATE) 1 g tablet Take 1 tablet (1 g total) by mouth 4 (four) times daily -  with meals and at bedtime. 60 tablet 0 08/06/2018    Inpatient Meds: See chart  Allergies  Allergen Reactions  . Flexeril [Cyclobenzaprine Hcl] Other (See Comments)    Causes him to pass out and lowers his BP  . Lidocaine Swelling    throat  . Novocain [Procaine Hcl] Swelling   Social History   Tobacco Use  . Smoking status: Never Smoker  . Smokeless tobacco: Never Used  Substance Use Topics  . Alcohol use: No    Alcohol/week: 0.0 standard drinks   Family History  Problem Relation Age of Onset  . Heart disease Mother        CHF  . Diabetes Mother   . Heart disease Father   .  Cancer Brother     ROS: Other than ROS in the HPI, all other systems were negative.  Exam: Temp: 97.7 F (36.5 C) Pulse Rate: 83 BP: 134/78 Resp: 18 SpO2: 96 %  Visual Acuity:  [place] (near)  Pinhole (near)   OD 20/30- + 20/25   OS 20/50 + 20/30+     OD OS  Confr Vis Fields Full Full  EOM (Primary) Full Full  Lids/Lashes Normal Normal  Conjunctiva White, quiet White, quiet  Adnexa  Normal Normal  Pupils  2.5 --> 2, sluggish, no rAPD 2.5 --> 2, sluggish, no rAPD  Cornea  Clear Clear  Anterior Chamber Formed, grossly quiet Formed, grossly quiet  Lens:  IOL IOL  IOP 14 12  Fundus - Dilated? YES   Optic Disc - C:D Ratio 0.35, pink, no edema 0.35, pink, no edema   Post Seg:  Retina                    Vessels Normal caliber Normal caliber                  Vitreous  Clear Clear                  Macula Few small MAs Few small MAs                  Periphery Few DBHs Few DBHs       Neuro:  Oriented to person, place, and time:  Yes Psychiatric:  Mood and Affect Appropriate:  Yes  A/P:  68 y.o. male with pressure sensation behind OS  1) Periocular pain - no ocular etiology noted - VA is normal, IOP is normal, there is no overt evidence of uveitis (albeit limited bedside exam). - Suspect pain is headache-related.  2) Moderate NPDR OU - Glucose control.  3) Hx rebound iritis OS - No photophobia, so this seems unlikely. - Recommend slitlamp exam in the office upon discharge.  4) CVA - Patient endorses decreased sensation on left side of face. This could predispose to dryness. Recommend artificial tears as needed.  Patient to call the office upon discharge to follow up with Dr. Marchelle Gearing.  R Fabian Sharp, MD  [address] [place] [address] [place], [place] [address]  R Fabian Sharp, MD 08/15/2018, 5:23 PM

## 2018-08-15 NOTE — Progress Notes (Signed)
Occupational Therapy Session Note  Patient Details  Name: Jeff [name] MRN: [id number] Date of Birth: [birth year]  Today's Date: 08/15/2018 OT Individual Time: 5956-3875 OT Individual Time Calculation (min): 75 min    Short Term Goals: Week 1:  OT Short Term Goal 1 (Week 1): Pt will be able to stand at sink to shave with S to support his standing balance. OT Short Term Goal 2 (Week 1): Pt will be able to stand in shower with S to wash bottom with use of grab bars. OT Short Term Goal 3 (Week 1): Pt will be able to stand and pull pants over hips with CGA without UE support.  Skilled Therapeutic Interventions/Progress Updates:    OT intervention with focus on BADL retraining, functional transfers, functional amb with RW, standing balance, activity tolerance, and safety awareness to increase independence with BADLs.  Pt completes bathing at shower level and dressing with sit<>stand from w/c at sink.  Pt requires CGA for bathing/dressing tasks and functional transfers.  Pt transitioned to gym and engaged in functional amb with RW at min A.  Pt amb with RW to ADL apartment and performed sit<>stand from sofa with min A.  Pt amb with RW to gym for rest before amb with RW to double doors before elevators.  Pt with increased LLE fatigue/weakness. Pt returned to room and remained in recliner with all needs within reach and belt alarm activated.   Therapy Documentation Precautions:  Precautions Precautions: Fall Restrictions Weight Bearing Restrictions: No Pain: Pain Assessment Pain Scale: 0-10 Pain Score: 0-No pain   Therapy/Group: Individual Therapy  Rich Brave 08/15/2018, 9:32 AM

## 2018-08-15 NOTE — Progress Notes (Signed)
Occupational Therapy Session Note  Patient Details  Name: Jeff [name] MRN: [id number] Date of Birth: May 20, 1951  Today's Date: 08/15/2018 OT Individual Time: 1345-1430 OT Individual Time Calculation (min): 45 min    Short Term Goals: Week 1:  OT Short Term Goal 1 (Week 1): Pt will be able to stand at sink to shave with S to support his standing balance. OT Short Term Goal 2 (Week 1): Pt will be able to stand in shower with S to wash bottom with use of grab bars. OT Short Term Goal 3 (Week 1): Pt will be able to stand and pull pants over hips with CGA without UE support.  Skilled Therapeutic Interventions/Progress Updates:  Pt found supine in bed upon OT arrival. Pt agreeable to OT session. Pt txfr EOB> w/c with MIN A. Pt transported to gym for time management.  Instructed pt on dynamic sitting balance activity using mirror for visual feedback during session. Pt able to lean forward pick up ball and transfer over head with MIN verbal cues for posture. Progressed task from sitting>standing to challenge functional sit>stand and dynamic standing balance. Pt require MIN A to maintain balance and posture during activity.  Instructed pt on dynamic standing balance to facilitate weight shifting for functional reach during ADLs. Pt require MIN A to maintain balance during activity. Engaged in stepping task with focus on weight shifting to improve dynamic standing balance using RW for balance. Pt required MIN A to maintain standing balance.  Pt ambulated back to room with RW with MIN A and MIN verbal cues for body mechanics during ambulation. Pt require x3 rest breaks due to increased fatigue. Pt returned back to recliner with all needs met.   Therapy Documentation Precautions:  Precautions Precautions: Fall Restrictions Weight Bearing Restrictions: No General:   Vital Signs: Therapy Vitals Temp: 97.7 F (36.5 C) Temp Source: Oral Pulse Rate: 83 Resp: 18 BP: 134/78 Patient Position (if  appropriate): Sitting Oxygen Therapy SpO2: 96 % O2 Device: Room Air Pain: Pt reports no pain during session    Therapy/Group: Individual Therapy  Ihor Gully 08/15/2018, 3:07 PM

## 2018-08-15 NOTE — Progress Notes (Signed)
[place] PHYSICAL MEDICINE & REHABILITATION PROGRESS NOTE   Subjective/Complaints:  Funny feeling in Left forearm intermittent, no change in strength, no other new symptoms Continues with poor coordination on Left   ROS- neg CP, SOB, N/V/D   Objective:   No results found. Recent Labs    08/13/18 0535  WBC 7.8  HGB 14.0  HCT 41.1  PLT 236   Recent Labs    08/13/18 0535  NA 139  K 3.6  CL 106  CO2 23  GLUCOSE 90  BUN 17  CREATININE 1.56*  CALCIUM 9.4    Intake/Output Summary (Last 24 hours) at 08/15/2018 0824 Last data filed at 08/15/2018 0735 Gross per 24 hour  Intake 1084 ml  Output -  Net 1084 ml     Physical Exam: Vital Signs Blood pressure 116/79, pulse 71, temperature 98 F (36.7 C), resp. rate 19, height 5\' 8"  (1.727 m), weight 85.4 kg, SpO2 95 %.   General: No acute distress Mood and affect are appropriate Heart: Regular rate and rhythm no rubs murmurs or extra sounds Lungs: Clear to auscultation, breathing unlabored, no rales or wheezes Abdomen: Positive bowel sounds, soft nontender to palpation, nondistended Extremities: No clubbing, cyanosis, or edema Skin: No evidence of breakdown, no evidence of rash Neurologic: Cranial nerves II through XII intact, motor strength is 5/5 in bilateral deltoid, bicep, tricep, grip, hip flexor, knee extensors, ankle dorsiflexor and plantar flexor Sensory exam normal sensation to light touch  in bilateral upper and lower extremities Reduced Left V1 and V2 Cerebellar exam normal finger to nose to finger as well as heel to shin in right  upper and lower extremities Left side mild hemiataxia unchanged Musculoskeletal: Full range of motion in all 4 extremities. No joint swelling   Assessment/Plan: 1. Functional deficits secondary to Left medullary infarct with Left hemiataxia, Left  which require 3+ hours per day of interdisciplinary therapy in a comprehensive inpatient rehab setting.  Physiatrist is providing  close team supervision and 24 hour management of active medical problems listed below.  Physiatrist and rehab team continue to assess barriers to discharge/monitor patient progress toward functional and medical goals  Care Tool:  Bathing  Bathing activity did not occur: Safety/medical concerns Body parts bathed by patient: Right arm, Left arm, Chest, Abdomen, Front perineal area, Buttocks, Right upper leg, Left upper leg, Right lower leg, Left lower leg, Face         Bathing assist Assist Level: Minimal Assistance - Patient > 75%     Upper Body Dressing/Undressing Upper body dressing Upper body dressing/undressing activity did not occur (including orthotics): Safety/medical concerns What is the patient wearing?: Pull over shirt    Upper body assist Assist Level: Set up assist    Lower Body Dressing/Undressing Lower body dressing    Lower body dressing activity did not occur: Safety/medical concerns What is the patient wearing?: Pants     Lower body assist Assist for lower body dressing: Moderate Assistance - Patient 50 - 74%(for balance)     Toileting Toileting    Toileting assist Assist for toileting: Contact Guard/Touching assist     Transfers Chair/bed transfer  Transfers assist     Chair/bed transfer assist level: Moderate Assistance - Patient 50 - 74%     Locomotion Ambulation   Ambulation assist      Assist level: Maximal Assistance - Patient 25 - 49% Assistive device: Other (comment)(none) Max distance: 39'   Walk 10 feet activity   Assist  Assist level: Maximal Assistance - Patient 25 - 49% Assistive device: Other (comment)(none)   Walk 50 feet activity   Assist    Assist level: Maximal Assistance - Patient 25 - 49% Assistive device: Other (comment)(none)    Walk 150 feet activity   Assist Walk 150 feet activity did not occur: Safety/medical concerns         Walk 10 feet on uneven surface  activity   Assist Walk 10  feet on uneven surfaces activity did not occur: Safety/medical concerns         Wheelchair     Assist Will patient use wheelchair at discharge?: No             Wheelchair 50 feet with 2 turns activity    Assist            Wheelchair 150 feet activity     Assist          Medical Problem List and Plan: 1.Functional and mobility deficitssecondary to leftposterolateralmedullary infarct -CIR PT, OT             -waxing and waning left facial sensory loss/dysesthesias might be related to medullary infarct.Discussed with Neuro Dr Roda Shutters, given these are intermittent an dnot associated with focal neuro deficit will monitor and cont DAPT                                         2. Antithrombotics: -DVT/anticoagulation:Pharmaceutical:Lovenox -antiplatelet therapy: ASA/Plavix X 3 weeks followed by Plavix alone 3. Pain Management:N/A 4. Mood:LCSW to follow for evaluation and support. -antipsychotic agents: N/A 5. Neuropsych: This patientiscapable of making decisions on hisown behalf. 6. Skin/Wound Care:LCSW to follow for evaluation and support. 7. Fluids/Electrolytes/Nutrition:Monitor I/O. Check lytes in am.  8. HTN: Monitor BP bid- avoid hypotension. Lisinopril on hold.  9. CKD: Baseline SCr 1.5--> improved to 1.25 10.T2DM-insulin dependent: Hgb A1c-9.6. On metformin 1000 mg bid--d/c due to CKD. Marland KitchenNPH 110 units with breakfast and 40 units with supper. Novolog meal coverage as well as SSI for tighter control.  CBG (last 3)  Recent Labs    08/14/18 1624 08/14/18 2109 08/15/18 0630  GLUCAP 215* 136* 153*  Controlled 3/12, reduced pm NPH to reduce risk of  Hypoglycemia    improved                           11. Dyslipidemia: On Crestor.  12. Diabetic gastroparesis: Continue to hold Reglan/Carafate. No N/V during current hospitalization.monitor for recurrence 13. Leucocytosis: Monitor for fevers or other signs of  infection. 14. Mild hypokalemia: Recheck labs in am.    LOS: 3 days A FACE TO FACE EVALUATION WAS PERFORMED  Erick Colace 08/15/2018, 8:24 AM

## 2018-08-16 LAB — GLUCOSE, CAPILLARY
GLUCOSE-CAPILLARY: 227 mg/dL — AB (ref 70–99)
Glucose-Capillary: 188 mg/dL — ABNORMAL HIGH (ref 70–99)
Glucose-Capillary: 92 mg/dL (ref 70–99)
Glucose-Capillary: 97 mg/dL (ref 70–99)
Glucose-Capillary: 331 mg/dL — ABNORMAL HIGH (ref 70–99)
Glucose-Capillary: 263 mg/dL — ABNORMAL HIGH (ref 70–99)

## 2018-08-16 MED ORDER — INSULIN NPH (HUMAN) (ISOPHANE) 100 UNIT/ML ~~LOC~~ SUSP
40.0000 [IU] | Freq: Two times a day (BID) | SUBCUTANEOUS | Status: DC
  Administered 2018-08-16 – 2018-08-20 (×8): 40 [IU] via SUBCUTANEOUS
  Filled 2018-08-16 (×2): qty 10

## 2018-08-16 NOTE — Progress Notes (Signed)
RN called to room with patient and family reporting patient just had an episode of tingling that went down Lt face and arm. Patient and family reports these episodes come and go. Vitals and blood sugar taken. Patient with no new deficits noted. Patient emotional after episode and family at the bedside reassuring patient. Patient reports symptoms went away. MD swartz notified. No changes to orders.

## 2018-08-16 NOTE — Progress Notes (Signed)
[place] PHYSICAL MEDICINE & REHABILITATION PROGRESS NOTE   Subjective/Complaints:  Up in bed. Feeling better. Eye gtt's helped left eye. Left face still feels a little numb  ROS: Patient denies fever, rash, sore throat, blurred vision, nausea, vomiting, diarrhea, cough, shortness of breath or chest pain, joint or back pain, headache, or mood change.   Objective:   No results found. No results for input(s): WBC, HGB, HCT, PLT in the last 72 hours. No results for input(s): NA, K, CL, CO2, GLUCOSE, BUN, CREATININE, CALCIUM in the last 72 hours.  Intake/Output Summary (Last 24 hours) at 08/16/2018 1125 Last data filed at 08/16/2018 0813 Gross per 24 hour  Intake 720 ml  Output -  Net 720 ml     Physical Exam: Vital Signs Blood pressure 118/78, pulse 72, temperature 98.3 F (36.8 C), temperature source Oral, resp. rate 19, height 5\' 8"  (1.727 m), weight 85.4 kg, SpO2 97 %.   Constitutional: No distress . Vital signs reviewed. HEENT: EOMI, oral membranes moist Neck: supple Cardiovascular: RRR without murmur. No JVD    Respiratory: CTA Bilaterally without wheezes or rales. Normal effort    GI: BS +, non-tender, non-distended  Extremities: No clubbing, cyanosis, or edema Skin: No evidence of breakdown, no evidence of rash Neurologic: Cranial nerves II through XII intact, motor strength is 5/5 in bilateral deltoid, bicep, tricep, grip, hip flexor, knee extensors, ankle dorsiflexor and plantar flexor Sensory exam normal sensation to light touch  in bilateral upper and lower extremities Reduced Left V1 and V2 ongoing Left hemiataxia  Musculoskeletal: Full range of motion in all 4 extremities. No joint swelling   Assessment/Plan: 1. Functional deficits secondary to Left medullary infarct with Left hemiataxia, Left  which require 3+ hours per day of interdisciplinary therapy in a comprehensive inpatient rehab setting.  Physiatrist is providing close team supervision and 24 hour  management of active medical problems listed below.  Physiatrist and rehab team continue to assess barriers to discharge/monitor patient progress toward functional and medical goals  Care Tool:  Bathing  Bathing activity did not occur: Safety/medical concerns Body parts bathed by patient: Right arm, Left arm, Chest, Abdomen, Front perineal area, Buttocks, Right upper leg, Left upper leg, Right lower leg, Left lower leg, Face         Bathing assist Assist Level: Supervision/Verbal cueing     Upper Body Dressing/Undressing Upper body dressing Upper body dressing/undressing activity did not occur (including orthotics): Safety/medical concerns What is the patient wearing?: Pull over shirt    Upper body assist Assist Level: Supervision/Verbal cueing    Lower Body Dressing/Undressing Lower body dressing    Lower body dressing activity did not occur: Safety/medical concerns What is the patient wearing?: Pants, Underwear/pull up     Lower body assist Assist for lower body dressing: Supervision/Verbal cueing     Toileting Toileting    Toileting assist Assist for toileting: Independent with assistive device Assistive Device Comment: walker   Transfers Chair/bed transfer  Transfers assist     Chair/bed transfer assist level: Minimal Assistance - Patient > 75%     Locomotion Ambulation   Ambulation assist      Assist level: Moderate Assistance - Patient 50 - 74% Assistive device: Other (comment)(none) Max distance: 100'   Walk 10 feet activity   Assist     Assist level: Moderate Assistance - Patient - 50 - 74% Assistive device: Other (comment)(none)   Walk 50 feet activity   Assist    Assist level: Moderate  Assistance - Patient - 50 - 74% Assistive device: Other (comment)(none)    Walk 150 feet activity   Assist Walk 150 feet activity did not occur: Safety/medical concerns         Walk 10 feet on uneven surface  activity   Assist Walk 10  feet on uneven surfaces activity did not occur: Safety/medical concerns         Wheelchair     Assist Will patient use wheelchair at discharge?: No             Wheelchair 50 feet with 2 turns activity    Assist            Wheelchair 150 feet activity     Assist          Medical Problem List and Plan: 1.Functional and mobility deficitssecondary to leftposterolateralmedullary infarct -CIR PT, OT             -waxing and waning left facial sensory loss/dysesthesias might be related to medullary infarct.Discussed with Neuro Dr Roda Shutters, given these are intermittent an dnot associated with focal neuro deficit will monitor and cont DAPT                                         2. Antithrombotics: -DVT/anticoagulation:Pharmaceutical:Lovenox -antiplatelet therapy: ASA/Plavix X 3 weeks followed by Plavix alone 3. Pain Management:N/A 4. Mood:LCSW to follow for evaluation and support. -antipsychotic agents: N/A 5. Neuropsych: This patientiscapable of making decisions on hisown behalf. 6. Skin/Wound Care:LCSW to follow for evaluation and support. 7. Fluids/Electrolytes/Nutrition:Monitor I/O. Check lytes in am.  8. HTN: Monitor BP bid- avoid hypotension. Lisinopril on hold.  9. CKD: Baseline SCr 1.5--> improved to 1.25 10.T2DM-insulin dependent: Hgb A1c-9.6. At home on metformin 1000 mg bid--d/c due to CKD. And NPH 110 units with breakfast and 40 units with supper.   -Novolog meal coverage as well as SSI for tighter control.  CBG (last 3)  Recent Labs    08/15/18 1701 08/15/18 2111 08/16/18 0607  GLUCAP 249* 188* 188*  Increase  NPH given hyperglycemia to 40u bid -diet education 11. Dyslipidemia: On Crestor.  12. Diabetic gastroparesis: Continue to hold Reglan/Carafate. No N/V during current hospitalization.monitor for recurrence 13. Leucocytosis: Monitor for fevers or other signs of infection. 14. Mild  hypokalemia: improved    LOS: 4 days A FACE TO FACE EVALUATION WAS PERFORMED  Ranelle Oyster 08/16/2018, 11:25 AM

## 2018-08-16 NOTE — Plan of Care (Signed)
  Problem: Consults Goal: RH STROKE PATIENT EDUCATION Description See Patient Education module for education specifics  Outcome: Progressing   Problem: RH SAFETY Goal: RH STG ADHERE TO SAFETY PRECAUTIONS W/ASSISTANCE/DEVICE Description STG Adhere to Safety Precautions With  Mod Assistance/Device.  Outcome: Progressing   Problem: RH KNOWLEDGE DEFICIT Goal: RH STG INCREASE KNOWLEDGE OF DIABETES Outcome: Progressing Goal: RH STG INCREASE KNOWLEDGE OF HYPERTENSION Outcome: Progressing Goal: RH STG INCREASE KNOWLEGDE OF HYPERLIPIDEMIA Outcome: Progressing Goal: RH STG INCREASE KNOWLEDGE OF STROKE PROPHYLAXIS Outcome: Progressing   Problem: RH BOWEL ELIMINATION Goal: RH STG MANAGE BOWEL WITH ASSISTANCE Description STG Manage Bowel with Min  Assistance.  Outcome: Progressing Goal: RH STG MANAGE BOWEL W/MEDICATION W/ASSISTANCE Description STG Manage Bowel with Medication with Min  Assistance.  Outcome: Progressing   Problem: RH BLADDER ELIMINATION Goal: RH STG MANAGE BLADDER WITH ASSISTANCE Description STG Manage Bladder With  Min Assistance  Outcome: Progressing   Problem: RH SKIN INTEGRITY Goal: RH STG SKIN FREE OF INFECTION/BREAKDOWN Outcome: Progressing Goal: RH STG MAINTAIN SKIN INTEGRITY WITH ASSISTANCE Description STG Maintain Skin Integrity With Min Assistance.  Outcome: Progressing   Problem: RH SAFETY Goal: RH STG ADHERE TO SAFETY PRECAUTIONS W/ASSISTANCE/DEVICE Description STG Adhere to Safety Precautions With Mod Assistance  RW WCDevice.  Outcome: Progressing   Problem: RH COGNITION-NURSING Goal: RH STG ANTICIPATES NEEDS/CALLS FOR ASSIST W/ASSIST/CUES Description STG Anticipates Needs/Calls for Assist With Min  Assistance/Cues.  Outcome: Progressing   Problem: RH PAIN MANAGEMENT Goal: RH STG PAIN MANAGED AT OR BELOW PT'S PAIN GOAL Description <=3/10  Outcome: Progressing   Problem: RH KNOWLEDGE DEFICIT Goal: RH STG INCREASE KNOWLEDGE OF  DIABETES Outcome: Progressing Goal: RH STG INCREASE KNOWLEDGE OF HYPERTENSION Outcome: Progressing Goal: RH STG INCREASE KNOWLEGDE OF HYPERLIPIDEMIA Outcome: Progressing Goal: RH STG INCREASE KNOWLEDGE OF STROKE PROPHYLAXIS Outcome: Progressing

## 2018-08-17 ENCOUNTER — Inpatient Hospital Stay (HOSPITAL_COMMUNITY): Payer: Medicare Other | Admitting: Occupational Therapy

## 2018-08-17 ENCOUNTER — Inpatient Hospital Stay (HOSPITAL_COMMUNITY): Payer: Medicare Other | Admitting: Physical Therapy

## 2018-08-17 LAB — GLUCOSE, CAPILLARY
GLUCOSE-CAPILLARY: 152 mg/dL — AB (ref 70–99)
GLUCOSE-CAPILLARY: 201 mg/dL — AB (ref 70–99)
Glucose-Capillary: 186 mg/dL — ABNORMAL HIGH (ref 70–99)
Glucose-Capillary: 137 mg/dL — ABNORMAL HIGH (ref 70–99)

## 2018-08-17 NOTE — Progress Notes (Signed)
Occupational Therapy Session Note  Patient Details  Name: Jeff [name] MRN: [id number] Date of Birth: April 20, 1951  Today's Date: 08/17/2018 OT Individual Time: [phone] and 1450-1533 OT Individual Time Calculation (min): 73 min and 43 min  Short Term Goals: Week 1:  OT Short Term Goal 1 (Week 1): Pt will be able to stand at sink to shave with S to support his standing balance. OT Short Term Goal 2 (Week 1): Pt will be able to stand in shower with S to wash bottom with use of grab bars. OT Short Term Goal 3 (Week 1): Pt will be able to stand and pull pants over hips with CGA without UE support.    Skilled Therapeutic Interventions/Progress Updates:    Pt greeted in bed with no c/o pain. Requesting to complete several ADL tasks. He completed shaving and oral care (while standing at sink), bathing (shower level, sit<stand using grab bars), and dressing (seated on TTB, sit<stand using device) during session. Tx focus on Lt NMR, functional ambulation, and standing balance. All functional transfers completed using RW at ambulatory level with supervision-steady assist. He had 2 small LOBs when ambulating but able to self correct unassisted. Pt stood at sink during grooming tasks for 15 minutes without LOB while maintaining midline. Vcs provided for decreasing reliance on sink for standing support. Also discussed sensation normalization techniques as pt reports Lt side of face is still numb from CVA. He stooped to 2nd drawer to select clothing items and moved a chair out of the way so he could access closet with close supervision. Steady assist and vcs for safe transfer to TTB after. He bathed with supervision/setup and then completed dressing. Close supervision sit<stand with device during LB dressing. Able to integrate Lt to untie and tie waist of shorts as well as button shirt. Had him stoop to floor to retrieve sneakers once in room. Pt able to do so with close supervision however fatigued quickly after and  needed to sit down. Given exhaustion scale 0-10, pt rated bending to floor as a 7. He reports he will have spouse set up shoes on higher surface or beside him when dressing at home. When donning sneakers EOB, had pt work on lifting L LE without UE support. Pt with posterior LOB to meet task demands, and able to correct balance without use of bedrail given vcs. He reported this too was very fatiguing: "Do you see me sweating?"  Pt incorporated L UE during all functional tasks needing bilateral involvement. Multiple seated rest breaks required to accommodate fatigue. Increased vcs for RW safety/positioning and hand placment during sit<stand transitions with greater onset of fatigue. At end of session pt opted to return to bed to rest before next therapy. Pt left with call bell and bed alarm set.   2nd Session 1:1 tx (43 min) Pt greeted in bed with family present. No c/o pain. He reported he used to enjoy tennis, but hasn't played in 40 years. Pt ambulated with RW to dayroom with close supervision and vcs for widening BOS. Worked on dynamic standing balance while he engaged in tennis volleying with dtr. OT provided steady assist while pt bent outside of base of support in all directions to meet task demands using tennis racket. Though he reported he had "no depth perception" pt made contact with ball during 75% of volleys. Upgraded task to have pt remove UE support on walker (he was holding device with Lt hand up until this point). After about 30 seconds, he  had one major LOB to the Lt requiring Mod A to correct. For remainder of session, he kept unilateral support on RW for safety. Longest standing window without rest 9 minutes. He then ambulated back to room in manner as written above with increased Lt foot drag. Vcs required to acknowledge and correct. Pt transferred back to bed and was left with all needs within reach, bed alarm set, and family present.   Therapy Documentation Precautions:   Precautions Precautions: Fall Restrictions Weight Bearing Restrictions: No Pain: Intermittent painful L UE tingling  Pt opting to "work through it" vs inquire RN for pain medicine. Otherwise, he had no pain c/os  ADL:        Therapy/Group: Individual Therapy  [place] Lasseter A Camyra Vaeth 08/17/2018, 2:33 PM

## 2018-08-17 NOTE — Progress Notes (Signed)
[facility] PHYSICAL MEDICINE & REHABILITATION PROGRESS NOTE   Subjective/Complaints:  Pt didn't sleep all that well stated that tingling in left arm and to a lesser extent in his face was bothering him. Doesn't want to take anything for sx  ROS: Patient denies fever, rash, sore throat, blurred vision, nausea, vomiting, diarrhea, cough, shortness of breath or chest pain,   headache, or mood change.    Objective:   No results found. No results for input(s): WBC, HGB, HCT, PLT in the last 72 hours. No results for input(s): NA, K, CL, CO2, GLUCOSE, BUN, CREATININE, CALCIUM in the last 72 hours.  Intake/Output Summary (Last 24 hours) at 08/17/2018 1008 Last data filed at 08/17/2018 0908 Gross per 24 hour  Intake 1200 ml  Output -  Net 1200 ml     Physical Exam: Vital Signs Blood pressure 127/85, pulse 80, temperature 98.6 F (37 C), temperature source Oral, resp. rate 17, height 5\' 8"  (1.727 m), weight 85.4 kg, SpO2 98 %.   Constitutional: No distress . Vital signs reviewed. HEENT: EOMI, oral membranes moist Neck: supple Cardiovascular: RRR without murmur. No JVD    Respiratory: CTA Bilaterally without wheezes or rales. Normal effort    GI: BS +, non-tender, non-distended  Extremities: No clubbing, cyanosis, or edema Skin: No evidence of breakdown, no evidence of rash Neurologic:   motor strength is 5/5 in bilateral deltoid, bicep, tricep, grip, hip flexor, knee extensors, ankle dorsiflexor and plantar flexor Sensory exam normal sensation to light touch  in bilateral upper and lower extremities Reduced Left V1 and V2 ongoing, left arm sx? Left hemiataxia  Musculoskeletal: Full range of motion in all 4 extremities. No joint swelling Psyc: pleasant but sl anxious  Assessment/Plan: 1. Functional deficits secondary to Left medullary infarct with Left hemiataxia, Left  which require 3+ hours per day of interdisciplinary therapy in a comprehensive inpatient rehab  setting.  Physiatrist is providing close team supervision and 24 hour management of active medical problems listed below.  Physiatrist and rehab team continue to assess barriers to discharge/monitor patient progress toward functional and medical goals  Care Tool:  Bathing  Bathing activity did not occur: Safety/medical concerns Body parts bathed by patient: Right arm, Left arm, Chest, Abdomen, Front perineal area, Buttocks, Right upper leg, Left upper leg, Right lower leg, Left lower leg, Face         Bathing assist Assist Level: Set up assist     Upper Body Dressing/Undressing Upper body dressing Upper body dressing/undressing activity did not occur (including orthotics): Safety/medical concerns What is the patient wearing?: Pull over shirt    Upper body assist Assist Level: Independent with assistive device    Lower Body Dressing/Undressing Lower body dressing    Lower body dressing activity did not occur: Safety/medical concerns What is the patient wearing?: Pants, Underwear/pull up     Lower body assist Assist for lower body dressing: Supervision/Verbal cueing     Toileting Toileting    Toileting assist Assist for toileting: Contact Guard/Touching assist Assistive Device Comment: walker   Transfers Chair/bed transfer  Transfers assist     Chair/bed transfer assist level: Contact Guard/Touching assist     Locomotion Ambulation   Ambulation assist      Assist level: Moderate Assistance - Patient 50 - 74% Assistive device: Other (comment)(none) Max distance: 100'   Walk 10 feet activity   Assist     Assist level: Moderate Assistance - Patient - 50 - 74% Assistive device: Other (comment)(none)  Walk 50 feet activity   Assist    Assist level: Moderate Assistance - Patient - 50 - 74% Assistive device: Other (comment)(none)    Walk 150 feet activity   Assist Walk 150 feet activity did not occur: Safety/medical concerns         Walk  10 feet on uneven surface  activity   Assist Walk 10 feet on uneven surfaces activity did not occur: Safety/medical concerns         Wheelchair     Assist Will patient use wheelchair at discharge?: No             Wheelchair 50 feet with 2 turns activity    Assist            Wheelchair 150 feet activity     Assist          Medical Problem List and Plan: 1.Functional and mobility deficitssecondary to leftposterolateralmedullary infarct -CIR PT, OT             -waxing and waning left facial sensory loss/dysesthesias might be related to medullary infarct. Continued to reassure pt re: these episodes. Some of them are driven by anxiety as well.  2. Antithrombotics: -DVT/anticoagulation:Pharmaceutical:Lovenox -antiplatelet therapy: ASA/Plavix X 3 weeks followed by Plavix alone 3. Pain Management:pt does not want any medication for nerve related pain at this time.  4. Mood:LCSW to follow for evaluation and support. -antipsychotic agents: N/A 5. Neuropsych: This patientiscapable of making decisions on hisown behalf. 6. Skin/Wound Care:LCSW to follow for evaluation and support. 7. Fluids/Electrolytes/Nutrition:Monitor I/O. Check lytes in am.  8. HTN: Monitor BP bid- avoid hypotension. Lisinopril on hold.  9. CKD: Baseline SCr 1.5--> improved to 1.25 10.T2DM-insulin dependent: Hgb A1c-9.6. At home on metformin 1000 mg bid--d/c due to CKD. And NPH 110 units with breakfast and 40 units with supper.   -Novolog meal coverage as well as SSI for tighter control.  CBG (last 3)  Recent Labs    08/16/18 1703 08/16/18 2109 08/17/18 0629  GLUCAP 227* 263* 186*  Increased  NPH given hyperglycemia to 40u bid on 3/14---observe today -diet education 11. Dyslipidemia: On Crestor.  12. Diabetic gastroparesis: Continue to hold Reglan/Carafate. No N/V during current hospitalization.monitor for recurrence 13.  Leucocytosis: Monitor for fevers or other signs of infection. 14. Mild hypokalemia: improved    LOS: 5 days A FACE TO FACE EVALUATION WAS PERFORMED  Ranelle Oyster 08/17/2018, 10:08 AM

## 2018-08-17 NOTE — Progress Notes (Signed)
Physical Therapy Session Note  Patient Details  Name: Jeff [name] MRN: [id number] Date of Birth: [birth year]  Today's Date: 08/17/2018 PT Individual Time: 0915-1010 PT Individual Time Calculation (min): 55 min   Short Term Goals: Week 1:  PT Short Term Goal 1 (Week 1): Pt will ambulate x 150 ft without AD and up to mod assist for correction of LOB during gait PT Short Term Goal 2 (Week 1): Pt will perform bed<>chair transfer consistently with min assist PT Short Term Goal 3 (Week 1): Pt will ambulate steps with B rails and min assist   Skilled Therapeutic Interventions/Progress Updates:    pt in bed and agreeable to therapy.  Transfers throughout session with stand pivot transfer with min A.  Pt requests to weigh himself on scale. Pt min A to step up on scale, supervision for standing balance with bilat UE support.  W/c mobility with bilat LEs throughout unit with supervision.  Gait with RW 100' x 2 with focus on wider BOS, upright posture and reducing Lt lean all with min manual facilitation.  Mod cuing and facilitation to maintain wider BOS during turns.  Tall kneeling with UE lifts with focus on trunk and pelvic stability with min facilitation for trunk and hips. Pt requires frequent rests due to fatigue with this activity.  Sit <> stand repetitions with adductor squeeze with mod A for trunk stability and balance.  Pt left in bed with alarm set, needs at hand.  Therapy Documentation Precautions:  Precautions Precautions: Fall Restrictions Weight Bearing Restrictions: No Pain: Pain Assessment Pain Scale: 0-10 Pain Score: 0-No pain   Therapy/Group: Individual Therapy  Jeff [name] 08/17/2018, 10:06 AM

## 2018-08-18 ENCOUNTER — Inpatient Hospital Stay (HOSPITAL_COMMUNITY): Payer: Medicare Other

## 2018-08-18 ENCOUNTER — Inpatient Hospital Stay (HOSPITAL_COMMUNITY): Payer: Self-pay

## 2018-08-18 ENCOUNTER — Inpatient Hospital Stay (HOSPITAL_COMMUNITY): Payer: Medicare Other | Admitting: Physical Therapy

## 2018-08-18 LAB — CBC
HCT: 42.7 % (ref 39.0–52.0)
Hemoglobin: 14.4 g/dL (ref 13.0–17.0)
MCH: 28.1 pg (ref 26.0–34.0)
MCHC: 33.7 g/dL (ref 30.0–36.0)
MCV: 83.4 fL (ref 80.0–100.0)
Platelets: 211 10*3/uL (ref 150–400)
RBC: 5.12 MIL/uL (ref 4.22–5.81)
RDW: 12.3 % (ref 11.5–15.5)
WBC: 7.2 10*3/uL (ref 4.0–10.5)
nRBC: 0 % (ref 0.0–0.2)

## 2018-08-18 LAB — BASIC METABOLIC PANEL
ANION GAP: 7 (ref 5–15)
BUN: 18 mg/dL (ref 8–23)
CALCIUM: 9.5 mg/dL (ref 8.9–10.3)
CO2: 27 mmol/L (ref 22–32)
Chloride: 102 mmol/L (ref 98–111)
Creatinine, Ser: 1.35 mg/dL — ABNORMAL HIGH (ref 0.61–1.24)
GFR calc Af Amer: >60 mL/min (ref >60)
GFR calc non Af Amer: 53 mL/min — ABNORMAL LOW (ref >60)
Glucose, Bld: 224 mg/dL — ABNORMAL HIGH (ref 70–99)
Potassium: 4.1 mmol/L (ref 3.5–5.1)
Sodium: 136 mmol/L (ref 135–145)

## 2018-08-18 LAB — GLUCOSE, CAPILLARY
GLUCOSE-CAPILLARY: 222 mg/dL — AB (ref 70–99)
Glucose-Capillary: 210 mg/dL — ABNORMAL HIGH (ref 70–99)
Glucose-Capillary: 148 mg/dL — ABNORMAL HIGH (ref 70–99)
Glucose-Capillary: 183 mg/dL — ABNORMAL HIGH (ref 70–99)
Glucose-Capillary: 173 mg/dL — ABNORMAL HIGH (ref 70–99)

## 2018-08-18 NOTE — Progress Notes (Signed)
Physical Therapy Session Note  Patient Details  Name: Jeff [name] MRN: [id number] Date of Birth: 17-Jul-1950  Today's Date: 08/18/2018 PT Individual Time: 3299-2426 PT Individual Time Calculation (min): 46 min   Short Term Goals: Week 1:  PT Short Term Goal 1 (Week 1): Pt will ambulate x 150 ft without AD and up to mod assist for correction of LOB during gait PT Short Term Goal 2 (Week 1): Pt will perform bed<>chair transfer consistently with min assist PT Short Term Goal 3 (Week 1): Pt will ambulate steps with B rails and min assist   Skilled Therapeutic Interventions/Progress Updates:    Pt received sitting in recliner and excited to participate in therapy. Pt ambulated ~261ft to therapy gym using RW with CGA/Min A for balance as pt continues to demonstrate wide based gait with uncoordinated stepping. Pt performed standing balance reaching task, feet shoulder width apart, reaching in PNF pattern from higher to lower surface with trunk rotation and weighshifting with with manual facilitation and cuing for weighshift with min A to mod A provided throughout for balance. Pt continues to demonstrate decreased R LE weighshifting during standing tasks as well as impaired motor force regulation. Pt performed side stepping at hallway rail without UE support - min A and occasional mod A for balance - with slow stepping pattern and increased time for motor planning, weightshifting and to maintain balance between steps. Ambulated ~24ft to/from hallway without AD with min A/mod A with 1 L lateral LOB requiring mod A for recovery. Performed tall kneeling PNF cross body reaching task from low to high surface with CGA for trunk balance throughout with facilitation and cuing for increased trunk and hip extension as well as weighshifting. Performed alternating foot taps on 2" step with pt demonstrating significant lack of R weighshift and pt reporting fear of falling to the R - provided visual input with pt  demonstrating improved R weighshift and upright posture as well as improved balance - pt required min A with occasional mod A due to lateral LOB. Pt ambulated ~243ft to room using RW with CGA/min A for balance and cuing for improved stance width and AD management, especially when turning. Pt left supine in bed with needs in reach and bed alarm on.   Therapy Documentation Precautions:  Precautions Precautions: Fall Restrictions Weight Bearing Restrictions: No  Pain:  No reports of pain throughout session   Therapy/Group: Individual Therapy  Ginny Forth, PT, DPT 08/18/2018, 8:11 AM

## 2018-08-18 NOTE — Progress Notes (Signed)
Physical Therapy Session Note  Patient Details  Name: Jeff [name] MRN: [id number] Date of Birth: Oct 08, 1950  Today's Date: 08/18/2018 PT Individual Time: 1400-1510 PT Individual Time Calculation (min): 70 min   Short Term Goals: Week 1:  PT Short Term Goal 1 (Week 1): Pt will ambulate x 150 ft without AD and up to mod assist for correction of LOB during gait PT Short Term Goal 2 (Week 1): Pt will perform bed<>chair transfer consistently with min assist PT Short Term Goal 3 (Week 1): Pt will ambulate steps with B rails and min assist   Skilled Therapeutic Interventions/Progress Updates:    Pt supine in bed upon PT arrival, agreeable to therapy tx and denies pain. Pt transferred to sitting EOB with supervision and ambulated to the gym with RW and min assist x 250 ft. Pt participated in gait training throughout entire session with use of LiteGait. Pt performed sit<>stands with supervision while donning litegait harness, pt stepped up onto treadmill with mod assist. In litegait pt performed lateral weighthifting and pre-gait stepping in place with facilitation for lateral weightshifts. Pt participated in treadmill training x 3 trials this session as follows: 2:13 minutes, 176 ft at 1.0 mph with mirror for visual feedback and verbal cues for step length, manual facilitation for increased weightshift to R 1:30 minutes, 100 ft at 1.0 mph without mirror,  verbal cues for step length, manual facilitation for increased weightshift to R 2:16 minutes, 196 ft at 1.0 mph,  verbal cues for step length, manual facilitation for increased weightshift to R Motor adaptation used this session in combination with use of lightgait in order to increase pts R weightshift, therapist applied orange theraband around waist and pulled pt to the left, cues for pt "don't let me pull you." Continued to use motor adaptation and litegait while performing toe taps with L LE to 6 inch step x 20 taps. Pt participated in overground gait  training with litegait to ambulate 2 x 250 ft forwards, x 100 ft backwards and sidestepping x 60 ft in each direction, cues throughout for foot placement and decreased wide BOS. Pt then ambulated back to room at end of session x 250 ft without AD and min assist, improved foot placement, postural control and R lateral weightshift noted. Pt left supine in bed with needs in reach and bed alarm set.   Therapy Documentation Precautions:  Precautions Precautions: Fall Restrictions Weight Bearing Restrictions: No   Therapy/Group: Individual Therapy  Cresenciano Genre, PT, DPT 08/18/2018, 1:19 PM

## 2018-08-18 NOTE — Progress Notes (Signed)
[place] PHYSICAL MEDICINE & REHABILITATION PROGRESS NOTE   Subjective/Complaints:   The patient denies any new complaints today.  He slept okay last night.  Reviewed blood work from today  ROS: Patient denies fever, rash, sore throat, blurred vision, nausea, vomiting, diarrhea, cough, shortness of breath or chest pain,   headache, or mood change.    Objective:   No results found. Recent Labs    08/18/18 0634  WBC 7.2  HGB 14.4  HCT 42.7  PLT 211   Recent Labs    08/18/18 0634  NA 136  K 4.1  CL 102  CO2 27  GLUCOSE 224*  BUN 18  CREATININE 1.35*  CALCIUM 9.5    Intake/Output Summary (Last 24 hours) at 08/18/2018 1726 Last data filed at 08/18/2018 0753 Gross per 24 hour  Intake 480 ml  Output -  Net 480 ml     Physical Exam: Vital Signs Blood pressure 121/80, pulse 75, temperature 97.6 F (36.4 C), resp. rate 18, height 5\' 8"  (1.727 m), weight 85.4 kg, SpO2 99 %.   Constitutional: No distress . Vital signs reviewed. HEENT: EOMI, oral membranes moist Neck: supple Cardiovascular: RRR without murmur. No JVD    Respiratory: CTA Bilaterally without wheezes or rales. Normal effort    GI: BS +, non-tender, non-distended  Extremities: No clubbing, cyanosis, or edema Skin: No evidence of breakdown, no evidence of rash Neurologic:   motor strength is 5/5 in bilateral deltoid, bicep, tricep, grip, hip flexor, knee extensors, ankle dorsiflexor and plantar flexor Sensory exam normal sensation to light touch  in bilateral upper and lower extremities Reduced Left V1 and V2 ongoing, left arm sx? Left hemiataxia, dysmetria finger-nose-finger  Musculoskeletal: Full range of motion in all 4 extremities. No joint swelling Psyc: pleasant but sl anxious  Assessment/Plan: 1. Functional deficits secondary to Left medullary infarct with Left hemiataxia, Left  which require 3+ hours per day of interdisciplinary therapy in a comprehensive inpatient rehab setting.  Physiatrist  is providing close team supervision and 24 hour management of active medical problems listed below.  Physiatrist and rehab team continue to assess barriers to discharge/monitor patient progress toward functional and medical goals  Care Tool:  Bathing  Bathing activity did not occur: Safety/medical concerns Body parts bathed by patient: Right arm, Left arm, Chest, Abdomen, Front perineal area, Buttocks, Right upper leg, Left upper leg, Right lower leg, Left lower leg, Face         Bathing assist Assist Level: Set up assist     Upper Body Dressing/Undressing Upper body dressing Upper body dressing/undressing activity did not occur (including orthotics): Safety/medical concerns What is the patient wearing?: Pull over shirt    Upper body assist Assist Level: Independent with assistive device    Lower Body Dressing/Undressing Lower body dressing    Lower body dressing activity did not occur: Safety/medical concerns What is the patient wearing?: Pants, Underwear/pull up     Lower body assist Assist for lower body dressing: Supervision/Verbal cueing     Toileting Toileting    Toileting assist Assist for toileting: Contact Guard/Touching assist Assistive Device Comment: walker   Transfers Chair/bed transfer  Transfers assist     Chair/bed transfer assist level: Contact Guard/Touching assist     Locomotion Ambulation   Ambulation assist      Assist level: Minimal Assistance - Patient > 75% Assistive device: Other (comment)(none) Max distance: 200 ft   Walk 10 feet activity   Assist     Assist level:  Minimal Assistance - Patient > 75% Assistive device: Other (comment)(none)   Walk 50 feet activity   Assist    Assist level: Minimal Assistance - Patient > 75% Assistive device: Other (comment)(none)    Walk 150 feet activity   Assist Walk 150 feet activity did not occur: Safety/medical concerns  Assist level: Minimal Assistance - Patient > 75%       Walk 10 feet on uneven surface  activity   Assist Walk 10 feet on uneven surfaces activity did not occur: Safety/medical concerns         Wheelchair     Assist Will patient use wheelchair at discharge?: No             Wheelchair 50 feet with 2 turns activity    Assist            Wheelchair 150 feet activity     Assist          Medical Problem List and Plan: 1.Functional and mobility deficitssecondary to leftposterolateralmedullary infarct -CIR PT, OT            No new sensory or motor complaints likely stabilizing from this current infarct 2. Antithrombotics: -DVT/anticoagulation:Pharmaceutical:Lovenox -antiplatelet therapy: ASA/Plavix X 3 weeks followed by Plavix alone 3. Pain Management:pt does not want any medication for nerve related pain at this time.  If facial dysesthetic pain becomes more problematic would recommend gabapentin 4. Mood:LCSW to follow for evaluation and support. -antipsychotic agents: N/A 5. Neuropsych: This patientiscapable of making decisions on hisown behalf. 6. Skin/Wound Care:LCSW to follow for evaluation and support. 7. Fluids/Electrolytes/Nutrition:Monitor I/O. Check lytes in am.  8. HTN: Monitor BP bid- avoid hypotension. Lisinopril on hold.  9. CKD: Baseline SCr 1.5--> improved to 1.25 10.T2DM-insulin dependent: Hgb A1c-9.6. At home on metformin 1000 mg bid--d/c due to CKD. And NPH 110 units with breakfast and 40 units with supper.   -Novolog meal coverage as well as SSI for tighter control.  CBG (last 3)  Recent Labs    08/18/18 0629 08/18/18 1159 08/18/18 1647  GLUCAP 210* 148* 183*  Increased  NPH given hyperglycemia to 40u bid on 3/14---observe today We will ask dietary to discuss food choices with the patient 11. Dyslipidemia: On Crestor.  12. Diabetic gastroparesis: Continue to hold Reglan/Carafate. No N/V during current hospitalization.monitor for  recurrence 13. Leucocytosis: Monitor for fevers or other signs of infection. 14. Mild hypokalemia: improved    LOS: 6 days A FACE TO FACE EVALUATION WAS PERFORMED  Erick Colace 08/18/2018, 5:26 PM

## 2018-08-18 NOTE — Progress Notes (Signed)
Occupational Therapy Session Note  Patient Details  Name: Jeff [name] MRN: [id number] Date of Birth: 01-Feb-1951  Today's Date: 08/18/2018 OT Individual Time: 0277-4128 OT Individual Time Calculation (min): 70 min    Short Term Goals: Week 1:  OT Short Term Goal 1 (Week 1): Pt will be able to stand at sink to shave with S to support his standing balance. OT Short Term Goal 2 (Week 1): Pt will be able to stand in shower with S to wash bottom with use of grab bars. OT Short Term Goal 3 (Week 1): Pt will be able to stand and pull pants over hips with CGA without UE support.  Skilled Therapeutic Interventions/Progress Updates:    OT intervention with focus on sit<>stand, standing balance, functional amb with RW, BADL retraining, activity tolerance, and safety awareness to increase independence with BADLs. Pt amb with RW to sink and completed grooming tasks (shaving and brushing teeth) while standing at sink using sink as balance support.  Pt rested in w/c before amb with RW to shower to complete bathing tasks.  Pt returned to room and completed dressing tasks with sit<>stand from w/c.  Pt exhibited increased fatigue near end of dressing tasks and required rest breaks X 2.  Pt commented on how tiring bathing/dressing were.  Pt amb with RW to recliner and remained in recliner with all needs within reach and seat alarm activated.   Therapy Documentation Precautions:  Precautions Precautions: Fall Restrictions Weight Bearing Restrictions: No   Pain:  Pt denies pain   Therapy/Group: Individual Therapy  Rich Brave 08/18/2018, 9:03 AM

## 2018-08-19 ENCOUNTER — Inpatient Hospital Stay (HOSPITAL_COMMUNITY): Payer: Medicare Other

## 2018-08-19 ENCOUNTER — Encounter (HOSPITAL_COMMUNITY): Payer: Medicare Other | Admitting: Psychology

## 2018-08-19 ENCOUNTER — Inpatient Hospital Stay (HOSPITAL_COMMUNITY): Payer: Self-pay | Admitting: Physical Therapy

## 2018-08-19 ENCOUNTER — Inpatient Hospital Stay (HOSPITAL_COMMUNITY): Payer: Self-pay

## 2018-08-19 DIAGNOSIS — Z794 Long term (current) use of insulin: Secondary | ICD-10-CM

## 2018-08-19 DIAGNOSIS — E0821 Diabetes mellitus due to underlying condition with diabetic nephropathy: Secondary | ICD-10-CM

## 2018-08-19 LAB — GLUCOSE, CAPILLARY
GLUCOSE-CAPILLARY: 144 mg/dL — AB (ref 70–99)
Glucose-Capillary: 168 mg/dL — ABNORMAL HIGH (ref 70–99)
Glucose-Capillary: 168 mg/dL — ABNORMAL HIGH (ref 70–99)

## 2018-08-19 NOTE — Progress Notes (Signed)
Occupational Therapy Weekly Progress Note  Patient Details  Name: Jeff [name] MRN: [id number] Date of Birth: 08/27/1950  Beginning of progress report period: August 13, 2018 End of progress report period: August 19, 2018  Patient has met 3 of 3 short term goals.  Pt is making steady progress with BADLs since admission.  Pt requires S/CGA for BADLs and min A/CGA for functional amb with RW for simple tasks.  Pt fatigues quickly and requires multiple rest breaks during therapy session.  Pt with increased L lean when fatigued and requires increased assistance with standing balance and functional amb when fatigues.  Family has been present to observe therapy sessions.  Patient continues to demonstrate the following deficits: muscle weakness, decreased cardiorespiratoy endurance, unbalanced muscle activation and decreased coordination and decreased sitting balance, decreased standing balance, decreased postural control and decreased balance strategies and therefore will continue to benefit from skilled OT intervention to enhance overall performance with BADL and iADL.  Patient progressing toward long term goals..  Continue plan of care.  OT Short Term Goals Week 1:  OT Short Term Goal 1 (Week 1): Pt will be able to stand at sink to shave with S to support his standing balance. OT Short Term Goal 1 - Progress (Week 1): Met OT Short Term Goal 2 (Week 1): Pt will be able to stand in shower with S to wash bottom with use of grab bars. OT Short Term Goal 2 - Progress (Week 1): Met OT Short Term Goal 3 (Week 1): Pt will be able to stand and pull pants over hips with CGA without UE support. OT Short Term Goal 3 - Progress (Week 1): Met Week 2:  OT Short Term Goal 1 (Week 2): STG=LTG 2/2 ELOS   Leotis Shames [facility] 08/19/2018, 7:26 AM

## 2018-08-19 NOTE — Progress Notes (Signed)
Occupational Therapy Session Note  Patient Details  Name: Jeff [name] MRN: [id number] Date of Birth: 12/12/1950  Today's Date: 08/19/2018 OT Individual Time: 0800-0930 OT Individual Time Calculation (min): 90 min    Short Term Goals: Week 2:  OT Short Term Goal 1 (Week 2): STG=LTG 2/2 ELOS  Skilled Therapeutic Interventions/Progress Updates:    OT intervention with focus on BADL retraining, functional amb with RW, dynamic standing balance, sit<>stand, activity tolerance, and safety awareness to increase independence with bADLs.  Pt amb with RW to bathroom after competing grooming tasks standing at sink. Pt requires CGA/min A for functional amb with RW.  Pt completed bathing/dressing tasks at supervision level and rest breaks X 3.  Pt fatigues quickly primarily when standing.  Pt transitoned to day room and engaged in Biodex tasks to improve weight shifts and standing balance.  Pt noted with significantly delayed balance reactions. Pt transitoined to Nustep and completed 10 mins at work load 5 and avg SPM of 76.  Pt returned to room and remained in recliner with all needs within reach and chair alarm activated.   Therapy Documentation Precautions:  Precautions Precautions: Fall Restrictions Weight Bearing Restrictions: No Pain:  Pt denies pain this morning.  Therapy/Group: Individual Therapy  Rich Brave 08/19/2018, 12:45 PM

## 2018-08-19 NOTE — Progress Notes (Signed)
Occupational Therapy Session Note  Patient Details  Name: Jeff [name] MRN: [id number] Date of Birth: 18-Mar-1951  Today's Date: 08/19/2018 OT Individual Time: 1400-1430 OT Individual Time Calculation (min): 30 min    Short Term Goals: Week 1:  OT Short Term Goal 1 (Week 1): Pt will be able to stand at sink to shave with S to support his standing balance. OT Short Term Goal 1 - Progress (Week 1): Met OT Short Term Goal 2 (Week 1): Pt will be able to stand in shower with S to wash bottom with use of grab bars. OT Short Term Goal 2 - Progress (Week 1): Met OT Short Term Goal 3 (Week 1): Pt will be able to stand and pull pants over hips with CGA without UE support. OT Short Term Goal 3 - Progress (Week 1): Met Week 2:  OT Short Term Goal 1 (Week 2): STG=LTG 2/2 ELOS  Skilled Therapeutic Interventions/Progress Updates:  Pt found supine in bed awaiting OT arrival. Pt txfr EOB>w/c with S. Pt transported to gym for time management. Pt complete dynamic standing balance activity to facilitate weight shirt to L side with RW for balance. Instructed pt to retrieve clothespin from basketball net by stepping forward to facilitate L sided weight shift and cross midline to return clothespin back to table. Pt complete task with CGA for balance. Facilitated dynamic standing balance by instructing pt to shift weight laterally to tap step in front using RW for balance. Pt complete task with MIN A for balance. Facilitated  functional mobility by instructing pt to retrieve towels/ wash cloths placed on floor with RW to challenge balance.Pt complete task with MIN A to maintain balance when reaching out of BOS.  Pt ambulated back to room with RW with MIN A for postural control during ambulation. Pt left up in bed with family present and all needs met.   Therapy Documentation Precautions:  Precautions Precautions: Fall Restrictions Weight Bearing Restrictions: No General:   Vital Signs: Therapy Vitals Temp: (!)  97.3 F (36.3 C) Temp Source: Oral Pulse Rate: 74 Resp: 18 BP: 133/84 Patient Position (if appropriate): Lying Oxygen Therapy SpO2: 98 % O2 Device: Room Air Pain: Pt reports no pain during session.    Therapy/Group: Individual Therapy  Ihor Gully 08/19/2018, 3:29 PM

## 2018-08-19 NOTE — Progress Notes (Signed)
Physical Therapy Session Note  Patient Details  Name: Jeff [name] MRN: [id number] Date of Birth: 09-Feb-1951  Today's Date: 08/19/2018 PT Individual Time: 1500-1545 PT Individual Time Calculation (min): 45 min   Short Term Goals: Week 1:  PT Short Term Goal 1 (Week 1): Pt will ambulate x 150 ft without AD and up to mod assist for correction of LOB during gait PT Short Term Goal 2 (Week 1): Pt will perform bed<>chair transfer consistently with min assist PT Short Term Goal 3 (Week 1): Pt will ambulate steps with B rails and min assist   Skilled Therapeutic Interventions/Progress Updates:    Pt supine in bed upon PT arrival, agreeable to therapy tx and denies pain. Pt ambulated to the gym without AD x 200 ft with CGA-min assist, occasional LOB and able to self correct. PT worked on Editor, commissioning and weightshifting this session while standing on blue rocker board and shifting back and forth, mirror for visual feedback, cues for controlled movemement, x 2 trials with min assist. Pt worked on dynamic standing balance dual task ambulating forwards while tossing ball, ambulating backwards while tossing ball and ambulating forwards while tossing ball sideways. Pt ambulated to the dayroom with min assist x 200 ft. Pt worked on dynamic standing balance and weightshifting while using cybex kinetron to perform alternating stepping without UE support, x 2 trials with mirror for feedback and cues for techniques. Pt ambulated back to gym and performed B calf stretch this session on bottom of 3 inch step, cues for techniques. Pt reports dizziness, vitals BP  126/104, HR 88 bpm. Pt reports that it is better and would like to walk back to room, ambulated x 200 ft with min assist and left in care of family.   Therapy Documentation Precautions:  Precautions Precautions: Fall Restrictions Weight Bearing Restrictions: No   Therapy/Group: Individual Therapy  Cresenciano Genre, PT, DPT 08/19/2018, 8:00 AM

## 2018-08-19 NOTE — Progress Notes (Signed)
[place] PHYSICAL MEDICINE & REHABILITATION PROGRESS NOTE   Subjective/Complaints:  No issues overnite   ROS: Patient denies CP, SOB, N/V/D Objective:   No results found. Recent Labs    08/18/18 0634  WBC 7.2  HGB 14.4  HCT 42.7  PLT 211   Recent Labs    08/18/18 0634  NA 136  K 4.1  CL 102  CO2 27  GLUCOSE 224*  BUN 18  CREATININE 1.35*  CALCIUM 9.5    Intake/Output Summary (Last 24 hours) at 08/19/2018 0835 Last data filed at 08/18/2018 1856 Gross per 24 hour  Intake 240 ml  Output -  Net 240 ml     Physical Exam: Vital Signs Blood pressure 108/71, pulse 69, temperature 98 F (36.7 C), temperature source Oral, resp. rate 18, height 5\' 8"  (1.727 m), weight 85.4 kg, SpO2 97 %.   Constitutional: No distress . Vital signs reviewed. HEENT: EOMI, oral membranes moist Neck: supple Cardiovascular: RRR without murmur. No JVD    Respiratory: CTA Bilaterally without wheezes or rales. Normal effort    GI: BS +, non-tender, non-distended  Extremities: No clubbing, cyanosis, or edema Skin: No evidence of breakdown, no evidence of rash Neurologic:   motor strength is 5/5 in bilateral deltoid, bicep, tricep, grip, hip flexor, knee extensors, ankle dorsiflexor and plantar flexor Sensory exam normal sensation to light touch  in bilateral upper and lower extremities Normal  Left V1 and V2 ongoing, Left hemiataxia, dysmetria finger-nose-finger  Musculoskeletal: Full range of motion in all 4 extremities. No joint swelling Psyc: pleasant but sl anxious  Assessment/Plan: 1. Functional deficits secondary to Left medullary infarct with Left hemiataxia, Left  which require 3+ hours per day of interdisciplinary therapy in a comprehensive inpatient rehab setting.  Physiatrist is providing close team supervision and 24 hour management of active medical problems listed below.  Physiatrist and rehab team continue to assess barriers to discharge/monitor patient progress toward  functional and medical goals  Care Tool:  Bathing  Bathing activity did not occur: Safety/medical concerns Body parts bathed by patient: Right arm, Left arm, Chest, Abdomen, Front perineal area, Buttocks, Right upper leg, Left upper leg, Right lower leg, Left lower leg, Face         Bathing assist Assist Level: Set up assist     Upper Body Dressing/Undressing Upper body dressing Upper body dressing/undressing activity did not occur (including orthotics): Safety/medical concerns What is the patient wearing?: Pull over shirt    Upper body assist Assist Level: Independent with assistive device    Lower Body Dressing/Undressing Lower body dressing    Lower body dressing activity did not occur: Safety/medical concerns What is the patient wearing?: Pants, Underwear/pull up     Lower body assist Assist for lower body dressing: Supervision/Verbal cueing     Toileting Toileting    Toileting assist Assist for toileting: Contact Guard/Touching assist Assistive Device Comment: walker   Transfers Chair/bed transfer  Transfers assist     Chair/bed transfer assist level: Contact Guard/Touching assist     Locomotion Ambulation   Ambulation assist      Assist level: Minimal Assistance - Patient > 75% Assistive device: Other (comment)(none) Max distance: 200 ft   Walk 10 feet activity   Assist     Assist level: Minimal Assistance - Patient > 75% Assistive device: Other (comment)(none)   Walk 50 feet activity   Assist    Assist level: Minimal Assistance - Patient > 75% Assistive device: Other (comment)(none)  Walk 150 feet activity   Assist Walk 150 feet activity did not occur: Safety/medical concerns  Assist level: Minimal Assistance - Patient > 75%      Walk 10 feet on uneven surface  activity   Assist Walk 10 feet on uneven surfaces activity did not occur: Safety/medical concerns         Wheelchair     Assist Will patient use  wheelchair at discharge?: No             Wheelchair 50 feet with 2 turns activity    Assist            Wheelchair 150 feet activity     Assist          Medical Problem List and Plan: 1.Functional and mobility deficitssecondary to leftposterolateralmedullary infarct -CIR PT, OT team conf in am            improved sensation Left V1 and V2 2. Antithrombotics: -DVT/anticoagulation:Pharmaceutical:Lovenox -antiplatelet therapy: ASA/Plavix X 3 weeks followed by Plavix alone 3. Pain Management:pt does not want any medication for nerve related pain at this time. 4. Mood:LCSW to follow for evaluation and support. -antipsychotic agents: N/A 5. Neuropsych: This patientiscapable of making decisions on hisown behalf. 6. Skin/Wound Care:LCSW to follow for evaluation and support. 7. Fluids/Electrolytes/Nutrition:Monitor I/O. Check lytes in am.  8. HTN: Monitor BP bid- avoid hypotension. Lisinopril on hold.  9. CKD: Baseline SCr 1.5--> improved to 1.25 10.T2DM-insulin dependent: Hgb A1c-9.6. At home on metformin 1000 mg bid--d/c due to CKD. And NPH 110 units with breakfast and 40 units with supper.   -Novolog meal coverage as well as SSI for tighter control.  CBG (last 3)  Recent Labs    08/18/18 1647 08/18/18 2041 08/19/18 0610  GLUCAP 183* 173* 144*  good in hospital control with goal 140-180 We will ask dietary to discuss food choices with the patient 11. Dyslipidemia: On Crestor.  12. Diabetic gastroparesis: Continue to hold Reglan/Carafate. No N/V during current hospitalization.monitor for recurrence 13. Leucocytosis: Monitor for fevers or other signs of infection. 14. Mild hypokalemia: improved    LOS: 7 days A FACE TO FACE EVALUATION WAS PERFORMED  Erick Colace 08/19/2018, 8:35 AM

## 2018-08-19 NOTE — Consult Note (Signed)
Neuropsychological Consultation   Patient:   Jeff [name]   DOB:   [birth year]  MR Number:  [id number]  [facility]  [facility] [address] RE[facility] [address] [id number] [place] [id number] Dept: [phone] Loc: [phone]           Date of Service:   08/19/2018  Start Time:   1 PM End Time:   2 PM  Provider/Observer:  Arley PhenixJohn Zavon Hyson, Psy.D.       Clinical Neuropsychologist       Billing Code/Service: [phone]  Chief Complaint:    Jeff [name] is a 68 year old male with a history of diabetes, hypertension, ADHD.  The patient [facility] with dizziness, difficulty walking and right facial numbness.  Imaging showed left vascular occlusion superimposed on stenosis due to some plaque superimposed on calcified ICA siphons.  MRI revealed new acute nonhemorrhagic punctuated infarct left posterolateral medulla and stable atrophy with white matter disease.  The patient is continued with some somatosensory changes particular on the upper side of his face as well as changes in balance and ataxia.  The patient is also had significant emotional instability that is atypical for him premorbidly.  The symptoms are consistent with acute pseudobulbar affect type symptoms.  Reason for Service:  The patient was referred for neuropsychological consultation due to adjustment coping issues.  Below is the HPI for the current admission.  HPI:Jeff [name] is a 68 year old male with history of T2DM, HTN, ADHD, whow as [facility] with dizziness, difficulty walking and right facial numbness. CTA head/neck revealed left VA V4 occlusion, superimposed moderate L-VA origin stenosis due to soft plaque, superimposed calcified ICA siphons with moderate left and moderate to severe right siphon stenosis and pseudoarthrosis of lower ACDF with loosening of hardware at C6/C7. MRI brain revealed new acute  nonhemorrhagicpunctate infarct left posterolateral medulla and stable atrophy with white matter disease. 2D echo showed EF 60-65% with moderate thickening of AV and no wall abnormality. Dr. Roda ShuttersXu felt that stroke likely due to L-VA dissection v/s atherosclerosis. Plavix added with recommendations for DAPT X 3 weeks followed by Plavix alone. Patient with resultant dizziness, tendency to lean to the left with balance deficits as well as ataxia affecting ADLs and CIR recommended due to functional decline.   Current Status:  The patient, his wife, and daughter were all present during the 1 hour I spent with him.  The patient was quite positive and euthymic being very outgoing and engaging.  The patient reports that he has had significant vacillations and extreme expressions of emotion which have been disconcerting to him.  The patient reports that prior to these recent events he would rarely if ever cry but he has had sudden onset of bouts of crying and other negative emotional responses.  He did not display any of these types of patterns during my interaction with him.  The patient reports that there is been significant improvements recently in his overall symptoms and while he continues to work on balance and motor functioning his emotional lability have continued to improve day by day.  The patient reports that he is coping fairly well with his current situation and the fact that he is seeing significant improvements over the days have also helped improve his outlook with regard to his current situation.  Behavioral Observation: Jeff [name]  presents as a 68 y.o.-year-old Right Caucasian Male who  appeared his stated age. his dress was Appropriate and he was Well Groomed and his manners were Appropriate to the situation.  his participation was indicative of Appropriate and Redirectable behaviors.  There were any physical disabilities noted.  he displayed an appropriate level of cooperation and motivation.      Interactions:    Active Appropriate and Redirectable  Attention:   abnormal and attention span appeared shorter than expected for age  Memory:   within normal limits; recent and remote memory intact  Visuo-spatial:  not examined  Speech (Volume):  normal  Speech:   normal;   Thought Process:  Coherent and Tangential  Though Content:  WNL; not suicidal and not homicidal  Orientation:   person, place, time/date and situation  Judgment:   Fair  Planning:   Good  Affect:    Excited  Mood:    Euthymic  Insight:   Good  Intelligence:   high  Medical History:   Past Medical History:  Diagnosis Date  . ADHD (attention deficit hyperactivity disorder)   . Diabetes mellitus   . Hyperlipidemia   . Hypertension              Abuse/Trauma History: The patient denies in no indication of significant prior traumatic experiences.  Psychiatric History:  The patient does have a prior diagnosis of ADHD and during my interactions with him the patient was hyperkinetic and somewhat distractible and tangential in his thinking.  The patient's daughter and wife indicated that these were not unusual symptoms for him historically.  Family Med/Psych History:  Family History  Problem Relation Age of Onset  . Heart disease Mother        CHF  . Diabetes Mother   . Heart disease Father   . Cancer Brother     Risk of Suicide/Violence: virtually non-existent the patient denies any suicidal or homicidal ideation.  Impression/DX:  Jeff [name] is a 68 year old male with a history of diabetes, hypertension, ADHD.  The patient was admitted to [facility] on 08/08/2018 with dizziness, difficulty walking and right facial numbness.  Imaging showed left vascular occlusion superimposed on stenosis due to some plaque superimposed on calcified ICA siphons.  MRI revealed new acute nonhemorrhagic punctuated infarct left posterolateral medulla and stable atrophy with white matter disease.  The patient  is continued with some somatosensory changes particular on the upper side of his face as well as changes in balance and ataxia.  The patient is also had significant emotional instability that is atypical for him premorbidly.  The symptoms are consistent with acute pseudobulbar affect type symptoms.  The patient, his wife, and daughter were all present during the 1 hour I spent with him.  The patient was quite positive and euthymic being very outgoing and engaging.  The patient reports that he has had significant vacillations and extreme expressions of emotion which have been disconcerting to him.  The patient reports that prior to these recent events he would rarely if ever cry but he has had sudden onset of bouts of crying and other negative emotional responses.  He did not display any of these types of patterns during my interaction with him.  The patient reports that there is been significant improvements recently in his overall symptoms and while he continues to work on balance and motor functioning his emotional lability have continued to improve day by day.  The patient reports that he is coping fairly well with his current situation and the fact that  he is seeing significant improvements over the days have also helped improve his outlook with regard to his current situation.   Disposition/Plan:  Today we worked on coping and adjustment issues and discussed issues such as the possibility of pseudobulbar affect.  However, this is a very acute presentation and the patient and his family report that there have been significant reductions in the symptoms just over the past few days.  In most cases the symptoms stabilize and do not persist and we likely should not make any psychotropic interventions at this time for the symptoms.  If the emotional lability were to worsen or remained active I will see the patient again to make further assessments regarding any treatment options.  Diagnosis:              Electronically Signed   _______________________ Arley Phenix, Psy.D.

## 2018-08-19 NOTE — Progress Notes (Signed)
Physical Therapy Session Note  Patient Details  Name: Jeff [name] MRN: [id number] Date of Birth: [birth year]  Today's Date: 08/19/2018 PT Individual Time: 1100-1130 PT Individual Time Calculation (min): 30 min   Short Term Goals: Week 1:  PT Short Term Goal 1 (Week 1): Pt will ambulate x 150 ft without AD and up to mod assist for correction of LOB during gait PT Short Term Goal 2 (Week 1): Pt will perform bed<>chair transfer consistently with min assist PT Short Term Goal 3 (Week 1): Pt will ambulate steps with B rails and min assist   Skilled Therapeutic Interventions/Progress Updates:    Pt received supine in bed and agreeable to therapy session. Pt performed sit<>stand transfers throughout session with CGA/min A for balance. Pt ambulated ~256ft to therapy gym without AD and min A for balance with pt demonstrating increased path deviation and inconsistent step length during obstacle navigation in hallway requiring increased assist. Pt performed standing balance task of horseshoes with focus on R lateral weightshift and reaching outside BOS with min A for balance. Performed sidestepping at hallway rail without UE support and CGA with pt demonstrating significant improvement in step length, speed of movement, and balance compared to prior session without lite gait support; however, continues to require occasional min A for balance. Pt performed alternating cone taps with focus on lateral weightshift and increased time in single leg stance with min A throughout and occasional mod A for balance with visual feedback provided for improved upright posture and weightshift. Pt ambulated ~238ft without AD back to room with CGA and pt demonstrating improved equality of weightshift, decreased path deviation, and improved balance compared to ambulation to room. Pt left supine in bed with needs in reach and bed alarm on.   Therapy Documentation Precautions:  Precautions Precautions: Fall Restrictions Weight  Bearing Restrictions: No  Pain: No complaints of pain throughout session.   Therapy/Group: Individual Therapy  Ginny Forth , PT, DPT 08/19/2018, 7:54 AM

## 2018-08-20 ENCOUNTER — Inpatient Hospital Stay (HOSPITAL_COMMUNITY): Payer: Medicare Other | Admitting: *Deleted

## 2018-08-20 ENCOUNTER — Inpatient Hospital Stay (HOSPITAL_COMMUNITY): Payer: Medicare Other

## 2018-08-20 ENCOUNTER — Inpatient Hospital Stay (HOSPITAL_COMMUNITY): Payer: Self-pay | Admitting: Physical Therapy

## 2018-08-20 ENCOUNTER — Inpatient Hospital Stay (HOSPITAL_COMMUNITY): Payer: Self-pay

## 2018-08-20 LAB — GLUCOSE, CAPILLARY
Glucose-Capillary: 212 mg/dL — ABNORMAL HIGH (ref 70–99)
Glucose-Capillary: 195 mg/dL — ABNORMAL HIGH (ref 70–99)
Glucose-Capillary: 179 mg/dL — ABNORMAL HIGH (ref 70–99)
Glucose-Capillary: 129 mg/dL — ABNORMAL HIGH (ref 70–99)
Glucose-Capillary: 148 mg/dL — ABNORMAL HIGH (ref 70–99)
Glucose-Capillary: 225 mg/dL — ABNORMAL HIGH (ref 70–99)

## 2018-08-20 MED ORDER — INSULIN NPH (HUMAN) (ISOPHANE) 100 UNIT/ML ~~LOC~~ SUSP
50.0000 [IU] | Freq: Every day | SUBCUTANEOUS | Status: DC
  Administered 2018-08-21 – 2018-08-27 (×7): 50 [IU] via SUBCUTANEOUS

## 2018-08-20 MED ORDER — INSULIN NPH (HUMAN) (ISOPHANE) 100 UNIT/ML ~~LOC~~ SUSP
5.0000 [IU] | SUBCUTANEOUS | Status: AC
  Administered 2018-08-20: 5 [IU] via SUBCUTANEOUS
  Filled 2018-08-20: qty 10

## 2018-08-20 MED ORDER — INSULIN NPH (HUMAN) (ISOPHANE) 100 UNIT/ML ~~LOC~~ SUSP
10.0000 [IU] | Freq: Once | SUBCUTANEOUS | Status: DC
  Filled 2018-08-20: qty 10

## 2018-08-20 MED ORDER — INSULIN NPH (HUMAN) (ISOPHANE) 100 UNIT/ML ~~LOC~~ SUSP
45.0000 [IU] | Freq: Every day | SUBCUTANEOUS | Status: DC
  Administered 2018-08-20 – 2018-08-25 (×6): 45 [IU] via SUBCUTANEOUS
  Filled 2018-08-20: qty 10

## 2018-08-20 MED ORDER — INSULIN NPH (HUMAN) (ISOPHANE) 100 UNIT/ML ~~LOC~~ SUSP: 50.0000 [IU] | Freq: Two times a day (BID) | SUBCUTANEOUS | Status: DC

## 2018-08-20 NOTE — Progress Notes (Signed)
Pt's nightly medication given. Assisted pt to the rest room. Pt back in bed. No other needs at this time.

## 2018-08-20 NOTE — Progress Notes (Signed)
Physical Therapy Session Note  Patient Details  Name: Jeff [name] MRN: [id number] Date of Birth: 06/04/1951  Today's Date: 08/20/2018 PT Individual Time: 1000-1030 and 1304-1400 PT Individual Time Calculation (min): 30 min and 56 minutes    Short Term Goals: Week 1:  PT Short Term Goal 1 (Week 1): Pt will ambulate x 150 ft without AD and up to mod assist for correction of LOB during gait PT Short Term Goal 2 (Week 1): Pt will perform bed<>chair transfer consistently with min assist PT Short Term Goal 3 (Week 1): Pt will ambulate steps with B rails and min assist   Skilled Therapeutic Interventions/Progress Updates:    Session 1: Pt supine in bed upon PT arrival, agreeable to therapy tx and denies pain. Pt transferred to sitting with supervision and ambulated to the gym without AD, min assist. Session focused on dynamic standing balance and R lateral weightshifting. Pt performed L lateral steps over object, x 20 with min-mod assist, and cues to correct L lateral lean. Pt performed toe taps with L LE x 20 while using theraband around hips for motor adaptation to correct L pull, min-mod assist. Pt performed standing hip abduction x 10 per LE while using parallel bars for UE support. Pt ambulated back to room x 150 ft with min assist, used bathroom and then transferred to bed. Pt left supine with needs in reach and bed alarm set.   Session 2: Pt supine in bed upon PT arrival, agreeable to therapy tx and denies pain. Pt ambulated to the ortho gym x 250 ft without AD and CGA. Pt participated in gait training with use of litegait and treadmill this session as follows: 2:04 minutes, 235 ft, 1.0-1.4 mph forwards ambulation with cues for increased step length and manual facilitation for R lateral weightshift.  2:00 minutes, 1.4 mph, 250 ft forwards ambulated with cues for increased step length and trunk extension.  1:12 minutes, 0.7 mph sidestepping to the R with mirror for visual feedback.  45 seconds,  0.5 mph, sidestepping to L with mirror for visual feedback.  While in litegait pt also worked on Location manager including sideways and backwards stepping strategies, x 2 trials in each direction, with each LE. In the litegait pt also performed toe taps to 6 inch step x 10 taps per LE, also worked on R LE standing on single leg. Pt ambulated to gym x 100 ft with CGA. Pt worked on dynamic standing balance without AD including ambulation with sudden stops, ambulation with change in gait speed and standing on airex performing mini squats. Pt worked on postural control and core strengthening while sitting on therapy ball performing lateral weightshifts, anterior/posterior weightshifts and marches. Pt ambulated back to room and therapist checked wife off on transfers/gait within room using RW. Pt left supine in bed with needs in reach and bed alarm set.    Therapy Documentation Precautions:  Precautions Precautions: Fall Restrictions Weight Bearing Restrictions: No    Therapy/Group: Individual Therapy  Cresenciano Genre , PT, DPT 08/20/2018, 7:49 AM

## 2018-08-20 NOTE — Progress Notes (Signed)
Occupational Therapy Session Note  Patient Details  Name: Jeff [name] MRN: [id number] Date of Birth: [birth year]  Today's Date: 08/20/2018 OT Individual Time: 0700-0800 OT Individual Time Calculation (min): 60 min    Short Term Goals: Week 2:  OT Short Term Goal 1 (Week 2): STG=LTG 2/2 ELOS  Skilled Therapeutic Interventions/Progress Updates:    OT intervention with focus on BADL retraining, standing balance, funcitonal amb with RW, activity tolerance and safety awareness to increase independence with BADLs.  Pt stood at sink to complete shaving and brushing teeth.  Pt uses sink to lean against.  Pt required rest break after grooming tasks before amb with RW into bathroom for shower.  Pt stood for approx 30% of shower this morning.  Pt returned to room and completed dressing tasks with sit<>stand from w/c.  Pt requires CGA for amb with RW.  Pt required rest breaks X 3 during session.  Educated pt on energy conservation strategies.  Discussed safety at home after discharge. Pt requested to return to bed at end of session.  Pt remained in bed with all needs within reach and bed alarm activated.   Therapy Documentation Precautions:  Precautions Precautions: Fall Restrictions Weight Bearing Restrictions: No Pain:  Pt denies pain this morning   Therapy/Group: Individual Therapy  Rich Brave 08/20/2018, 9:17 AM

## 2018-08-20 NOTE — Plan of Care (Signed)
1 

## 2018-08-20 NOTE — Evaluation (Signed)
Recreational Therapy Assessment and Plan  Patient Details  Name: Jeff [name] MRN: [id number] Date of Birth: 1951/01/28 Today's Date: 08/20/2018  Rehab Potential:   Good ELOS:  discharge 3/25   Problem List:      Patient Active Problem List   Diagnosis Date Noted  . Stroke due to embolism of left vertebral artery ([place]) 08/12/2018  . Stroke ([place]) 08/08/2018  . Diabetes ([place]) 07/25/2015  . Neck injury 06/22/2015  . Hearing loss in right ear 10/14/2011  . Agent orange exposure 10/14/2011  . ADD (attention deficit disorder) 10/14/2011  . Hyperlipidemia 10/14/2011  . BMI 28.0-28.9,adult 10/14/2011    Past Medical History:      Past Medical History:  Diagnosis Date  . ADHD (attention deficit hyperactivity disorder)   . Diabetes mellitus   . Hyperlipidemia   . Hypertension    Past Surgical History:       Past Surgical History:  Procedure Laterality Date  . NECK SURGERY    . SPINE SURGERY     Cervical spine x 2; Hardin Negus; [employer].    Assessment & Plan Clinical Impression: Patient is a 68 y.o. year old male with history of T2DM, HTN, ADHD, whow as admitted to [facility] on 08/08/18 with dizziness, difficulty walking and right facial numbness. CTA head/neck revealed left VA V4 occlusion, superimposed moderate L-VA origin stenosis due to soft plaque, superimposed calcified ICA siphons with moderate left and moderate to severe right siphon stenosis and pseudoarthrosis of lower ACDF with loosening of hardware at C6/C7. MRI brain revealed new acute nonhemorrhagicpunctate infarct left posterolateral medulla and stable atrophy with white matter disease. 2D echo showed EF 60-65% with moderate thickening of AV and no wall abnormality. Dr. Erlinda Hong felt that stroke likely due to L-VA dissection v/s atherosclerosis. Plavix added with recommendations for DAPT X 3 weeks followed by Plavix alone. Patient with resultant dizziness, tendency to lean to the left with balance deficits as well as  ataxia affecting ADLs and CIR recommended due to functional decline.  Patient transferred to CIR on 08/12/2018 .   Pt presents with decreased activity tolerance, decreased functional mobility, decreased balance, decreased coordination, decreased safety, decreased leisure awareness Limiting pt's independence with leisure/community pursuits.  Plan Min 1 TR session >20 minutes during LOS  Recommendations for other services: None   Discharge Criteria: Patient will be discharged from TR if patient refuses treatment 3 consecutive times without medical reason.  If treatment goals not met, if there is a change in medical status, if patient makes no progress towards goals or if patient is discharged from hospital.  The above assessment, treatment plan, treatment alternatives and goals were discussed and mutually agreed upon: by patient  [place] 08/20/2018, 4:17 PM

## 2018-08-20 NOTE — Progress Notes (Signed)
Social Work Patient ID: Jeff [name], male   DOB: 04/17/1951, 68 y.o.   MRN: [id number] Met with pt to discuss team conference goals supervision level and target discharge date 3/25. Aware MD adjusting his diabetes medications. He is agreeable to OP rehab due to doing so well. Will work toward discharge next Wed.

## 2018-08-20 NOTE — Progress Notes (Signed)
[place] [place] PHYSICAL MEDICINE & REHABILITATION PROGRESS NOTE   Subjective/Complaints:  No issues overnite  ROS- neg CP SOB, N/V/D   ROS: Patient denies CP, SOB, N/V/D Objective:   No results found. Recent Labs    08/18/18 0634  WBC 7.2  HGB 14.4  HCT 42.7  PLT 211   Recent Labs    08/18/18 0634  NA 136  K 4.1  CL 102  CO2 27  GLUCOSE 224*  BUN 18  CREATININE 1.35*  CALCIUM 9.5    Intake/Output Summary (Last 24 hours) at 08/20/2018 0947 Last data filed at 08/19/2018 1700 Gross per 24 hour  Intake 480 ml  Output -  Net 480 ml     Physical Exam: Vital Signs Blood pressure 114/66, pulse 70, temperature 97.7 F (36.5 C), resp. rate 18, height 5' 8" (1.727 m), weight 85.4 kg, SpO2 96 %.   Constitutional: No distress . Vital signs reviewed. HEENT: EOMI, oral membranes moist Neck: supple Cardiovascular: RRR without murmur. No JVD    Respiratory: CTA Bilaterally without wheezes or rales. Normal effort    GI: BS +, non-tender, non-distended  Extremities: No clubbing, cyanosis, or edema Skin: No evidence of breakdown, no evidence of rash Neurologic:   motor strength is 5/5 in bilateral deltoid, bicep, tricep, grip, hip flexor, knee extensors, ankle dorsiflexor and plantar flexor Sensory exam normal sensation to light touch  in bilateral upper and lower extremities  Left hemiataxia, dysmetria finger-nose-finger  Musculoskeletal: Full range of motion in all 4 extremities. No joint swelling Psyc: pleasant but sl anxious  Assessment/Plan: 1. Functional deficits secondary to Left medullary infarct with Left hemiataxia, Left  which require 3+ hours per day of interdisciplinary therapy in a comprehensive inpatient rehab setting.  Physiatrist is providing close team supervision and 24 hour management of active medical problems listed below.  Physiatrist and rehab team continue to assess barriers to discharge/monitor patient progress toward functional and medical  goals  Care Tool:  Bathing  Bathing activity did not occur: Safety/medical concerns Body parts bathed by patient: Right arm, Left arm, Chest, Abdomen, Front perineal area, Buttocks, Right upper leg, Left upper leg, Right lower leg, Left lower leg, Face         Bathing assist Assist Level: Set up assist     Upper Body Dressing/Undressing Upper body dressing Upper body dressing/undressing activity did not occur (including orthotics): Safety/medical concerns What is the patient wearing?: Pull over shirt    Upper body assist Assist Level: Independent with assistive device    Lower Body Dressing/Undressing Lower body dressing    Lower body dressing activity did not occur: Safety/medical concerns What is the patient wearing?: Pants, Underwear/pull up     Lower body assist Assist for lower body dressing: Supervision/Verbal cueing     Toileting Toileting    Toileting assist Assist for toileting: Contact Guard/Touching assist Assistive Device Comment: walker   Transfers Chair/bed transfer  Transfers assist     Chair/bed transfer assist level: Contact Guard/Touching assist     Locomotion Ambulation   Ambulation assist      Assist level: Minimal Assistance - Patient > 75% Assistive device: Other (comment)(none) Max distance: 251f   Walk 10 feet activity   Assist     Assist level: Minimal Assistance - Patient > 75% Assistive device: Other (comment)(none)   Walk 50 feet activity   Assist    Assist level: Minimal Assistance - Patient > 75% Assistive device: Other (comment)(none)    Walk 150 feet activity  Assist Walk 150 feet activity did not occur: Safety/medical concerns  Assist level: Minimal Assistance - Patient > 75% Assistive device: Other (comment)(none)    Walk 10 feet on uneven surface  activity   Assist Walk 10 feet on uneven surfaces activity did not occur: Safety/medical concerns         Wheelchair     Assist Will  patient use wheelchair at discharge?: No             Wheelchair 50 feet with 2 turns activity    Assist            Wheelchair 150 feet activity     Assist          Medical Problem List and Plan: 1.Functional and mobility deficitssecondary to leftposterolateralmedullary infarct -CIR PT, OT , Team conference today please see physician documentation under team conference tab, met with team face-to-face to discuss problems,progress, and goals. Formulized individual treatment plan based on medical history, underlying problem and comorbidities.            improved sensation Left V1 and V2 2. Antithrombotics: -DVT/anticoagulation:Pharmaceutical:Lovenox -antiplatelet therapy: ASA/Plavix X 3 weeks followed by Plavix alone 3. Pain Management:pt does not want any medication for nerve related pain at this time. 4. Mood:LCSW to follow for evaluation and support. -antipsychotic agents: N/A 5. Neuropsych: This patientiscapable of making decisions on hisown behalf. 6. Skin/Wound Care:LCSW to follow for evaluation and support. 7. Fluids/Electrolytes/Nutrition:Monitor I/O. Check lytes in am.  8. HTN: Monitor BP bid- avoid hypotension. Lisinopril on hold.  9. CKD: Baseline SCr 1.5--> improved to 1.25 10.T2DM-insulin dependent: Hgb A1c-9.6. At home on metformin 1000 mg bid--d/c due to CKD. And NPH 110 units with breakfast and 40 units with supper.   -Novolog meal coverage as well as SSI for tighter control.  CBG (last 3)  Recent Labs    08/19/18 1727 08/19/18 2011 08/20/18 0624  GLUCAP 168* 212* 195*  good in hospital control with goal 140-180, will increase NPH by 5U to achieve these goals We will ask dietary to discuss food choices with the patient 11. Dyslipidemia: On Crestor.  12. Diabetic gastroparesis: Continue to hold Reglan/Carafate. No N/V during current hospitalization.monitor for recurrence 13. Leucocytosis:  resolved afebrile14. Mild hypokalemia: improved    LOS: 8 days A FACE TO FACE EVALUATION WAS PERFORMED  Charlett Blake 08/20/2018, 9:47 AM

## 2018-08-20 NOTE — Progress Notes (Signed)
Physical Therapy Weekly Progress Note  Patient Details  Name: Jeff [name] MRN: [id number] Date of Birth: 1951-01-17  Beginning of progress report period: August 13, 2018 End of progress report period: August 20, 2018  Today's Date: 08/20/2018 PT Individual Time: 1445-1531 PT Individual Time Calculation (min): 46 min   Patient has met 3 of 3 short term goals. Patient progressing well with therapy; however, continues to demonstrate impairments in motor control via ataxia, balance, activity tolerance, and decreased R LE weightshifting during standing/gait all contributing to decreased independence with transfers and gait requiring CGA and occasional min A for balance.   Patient continues to demonstrate the following deficits muscle weakness, decreased cardiorespiratoy endurance, unbalanced muscle activation, ataxia and decreased coordination and decreased standing balance, decreased postural control and decreased balance strategies and therefore will continue to benefit from skilled PT intervention to increase functional independence with mobility.  Patient progressing toward long term goals..  Continue plan of care.  PT Short Term Goals Week 1:  PT Short Term Goal 1 (Week 1): Pt will ambulate x 150 ft without AD and up to mod assist for correction of LOB during gait PT Short Term Goal 1 - Progress (Week 1): Met PT Short Term Goal 2 (Week 1): Pt will perform bed<>chair transfer consistently with min assist PT Short Term Goal 2 - Progress (Week 1): Met PT Short Term Goal 3 (Week 1): Pt will ambulate steps with B rails and min assist  PT Short Term Goal 3 - Progress (Week 1): Met Week 2:  PT Short Term Goal 1 (Week 2): = to LTG based on ELOS  Skilled Therapeutic Interventions/Progress Updates:    Pt received supine in bed with wife and Rec Therapist present. Pt reports increased fatigue from earlier therapy sessions; however, still agreeable to PT session. Performed supine to sit with supervision  and upon sitting on EOB reports "everything is spinning" vitals assessed BP 94/83 HR 79bpm and pt returned to supine with supervision and vitals reassessed to be BP 122/78 and pt reported no longer experiencing "spinning" sensation but continues to report "lightheadedness." Therapist notified PA and present in room with RN to assess blood sugar and PA cleared pt for continued therapy while monitoring vitals with pt response to activity.  Performed stand or squat pivot transfers throughout therapy session with close supervision. Performed wheelchair propulsion using B LEs to therapy gym for pt safety with supervision. Performed Nustep with B LE and B UE focusing on activity tolerance for 5 minutes at Work Load of 5 reaching 272 steps with pt reporting no dizziness/lightheadedness symptoms and improved feeling overall. Pt ambulated ~135f, no AD, to other therapy gym with close supervision/CGA and cuing for narrowing BOS to normal step width. Pt ascended/descended 4 steps using bilateral handrails with CGA using reciprocal stepping pattern while demonstrating increased unsteadiness on descent with anterior weighshift and forward lean; however, pt able to maintain balance with UE support on handrails. Progressed to ascending/descending 4 steps without UE support maintaining reciprocal pattern; however, required min A on descent for balance with cuing on improved upright posture due to anterior forward lean. Seated rest break provided with vitals reassessed BP 127/91, HR 86bpm. Performed dynamic standing balance task of PNF cross body reaching between high and low surfaces while maintaining shoulder width stance with pt demonstrating significant improvement from prior therapy session to now performing with CGA and decreased postural sway and no LOB. Pt ambulated ~2548fto room, no AD, with CGA/supervision for steadying/safety. Vitals reassessed  sitting on EOB BP 139/81 (MAP 100), HR 85bpm. Pt left supine in bed with  needs in reach, bed alarm on, and family present.  Therapy Documentation Precautions:  Precautions Precautions: Fall Restrictions Weight Bearing Restrictions: No  Vital Signs: Therapy Vitals Pulse Rate: 85 BP: 139/81 Patient Position (if appropriate): Sitting(at end of therapy session) Pain:  No reports of pain.  Therapy/Group: Individual Therapy  Tawana Scale, PT, DPT 08/20/2018, 3:51 PM

## 2018-08-21 ENCOUNTER — Inpatient Hospital Stay (HOSPITAL_COMMUNITY): Payer: Self-pay | Admitting: Physical Therapy

## 2018-08-21 ENCOUNTER — Inpatient Hospital Stay (HOSPITAL_COMMUNITY): Payer: Medicare Other | Admitting: Physical Therapy

## 2018-08-21 ENCOUNTER — Inpatient Hospital Stay (HOSPITAL_COMMUNITY): Payer: Medicare Other

## 2018-08-21 LAB — BASIC METABOLIC PANEL
Anion gap: 8 (ref 5–15)
BUN: 15 mg/dL (ref 8–23)
CHLORIDE: 104 mmol/L (ref 98–111)
CO2: 26 mmol/L (ref 22–32)
Calcium: 9.4 mg/dL (ref 8.9–10.3)
Creatinine, Ser: 1.52 mg/dL — ABNORMAL HIGH (ref 0.61–1.24)
GFR calc Af Amer: 53 mL/min — ABNORMAL LOW (ref >60)
GFR calc non Af Amer: 46 mL/min — ABNORMAL LOW (ref >60)
Glucose, Bld: 100 mg/dL — ABNORMAL HIGH (ref 70–99)
Potassium: 4.5 mmol/L (ref 3.5–5.1)
Sodium: 138 mmol/L (ref 135–145)

## 2018-08-21 LAB — GLUCOSE, CAPILLARY
Glucose-Capillary: 152 mg/dL — ABNORMAL HIGH (ref 70–99)
Glucose-Capillary: 111 mg/dL — ABNORMAL HIGH (ref 70–99)
Glucose-Capillary: 113 mg/dL — ABNORMAL HIGH (ref 70–99)

## 2018-08-21 NOTE — Progress Notes (Signed)
Physical Therapy Session Note  Patient Details  Name: Jeff [name] MRN: [id number] Date of Birth: July 16, 1950  Today's Date: 08/21/2018 PT Individual Time: 1500-1600 PT Individual Time Calculation (min): 60 min   Short Term Goals: Week 2:  PT Short Term Goal 1 (Week 2): = to LTG based on ELOS  Skilled Therapeutic Interventions/Progress Updates:   Pt in supine and agreeable to therapy, no c/o pain. Ambulated around unit w/ CGA w/o AD, >150', to work on dynamic gait and challenging balance. No overt LOB, pt able to self-correct w/o manual assist from therapist. Worked on balance strategies w/ sit<>stands from mat onto airex pad w/ emphasis on R weight shifting to reach for object, min assist x6 reps. Pt w/ increased c/o dizziness and needing to sit down. Vitals WNL throughout session and dizziness resolves w/ brief seated rest. Pt w/ TED hose donned, no abdominal binder as BP WNL. Suspect some dizziness 2/2 central vestibular impairments. Pt performed multiple functional tasks including R/L rolling, sit<>supine, sit<>stand, and gait w/ head turns. Pt most symptomatic w/ R rolling, R head turns, and supine>sit. Educated pt and wife on vestibular impairments including causes and how PT can address it. Provided w/ 2 exercises that moderately increase symptoms, VOR x1 and R head turns, to work on habituation. Instructed to perform w/ 3-5 head turns each, 5-10x per day. Cautioned w/ "over-doing" it as to not limit his ability to meaningfully participate in therapies. Pt verbalized understanding and returned demonstration of all exercises correctly. Provided w/ written handout as well w/ general information about the vestibular system. Returned to room and ended session in supine, in care of wife and all needs met.   Therapy Documentation Precautions:  Precautions Precautions: Fall Restrictions Weight Bearing Restrictions: No Vital Signs: Therapy Vitals Temp: 98.1 F (36.7 C) Temp Source: Oral Pulse  Rate: 77 Resp: (!) 22 BP: 136/85 Patient Position (if appropriate): Sitting Oxygen Therapy SpO2: 98 % O2 Device: Room Air  Therapy/Group: Individual Therapy  Alayiah Fontes Clent Demark 08/21/2018, 4:38 PM

## 2018-08-21 NOTE — Progress Notes (Signed)
Occupational Therapy Session Note  Patient Details  Name: Jeff [name] MRN: [id number] Date of Birth: 1951-04-06  Today's Date: 08/21/2018 OT Individual Time: 0700-0800 OT Individual Time Calculation (min): 60 min    Short Term Goals: Week 1:  OT Short Term Goal 1 (Week 1): Pt will be able to stand at sink to shave with S to support his standing balance. OT Short Term Goal 1 - Progress (Week 1): Met OT Short Term Goal 2 (Week 1): Pt will be able to stand in shower with S to wash bottom with use of grab bars. OT Short Term Goal 2 - Progress (Week 1): Met OT Short Term Goal 3 (Week 1): Pt will be able to stand and pull pants over hips with CGA without UE support. OT Short Term Goal 3 - Progress (Week 1): Met  Skilled Therapeutic Interventions/Progress Updates:    OT intervention with focus on functional amb without AD, standing balance during BADLs, activity tolerance, and safety awareness to increase independence with BADLs.  Pt resting in bed upon arrival and ready for shower.  Pt amb without AD (CGA) to sink to brush teeth while standing at sink.  Pt amb without AD into bathroom (CGA) for shower.  Pt stood for complete shower this morning using grab bars periodically to steady.  Pt returned to room and completed dressing with sit<>stand from w/c.  Pt completed dressing tasks at supervision level.  Pt commented that standing during shower was exhausting.  Educated pt on energy conservation strategies and general home safety.  Pt verbalized understanding.  Pt requested to return to bed and remained in bed with all needs within reach and bed alarm activated.   Therapy Documentation Precautions:  Precautions Precautions: Fall Restrictions Weight Bearing Restrictions: No Pain:  Pt denies pain this morning.   Therapy/Group: Individual Therapy  Leroy Libman 08/21/2018, 8:26 AM

## 2018-08-21 NOTE — Progress Notes (Signed)
Orthopedic Tech Progress Note Patient Details:  Jeff [name] [birth year] [id number] Patient was in bed RN said she'll put it on him Ortho Devices Type of Ortho Device: Abdominal binder Ortho Device/Splint Interventions: Other (comment)   Post Interventions Patient Tolerated: Other (comment) Instructions Provided: Other (comment)   [name] [name] 08/21/2018, 1:02 PM

## 2018-08-21 NOTE — Progress Notes (Signed)
Physical Therapy Session Note  Patient Details  Name: Jeff [name] MRN: [id number] Date of Birth: 1951/01/10  Today's Date: 08/21/2018 PT Individual Time: 1002-1117 PT Individual Time Calculation (min): 75 min   Short Term Goals: Week 2:  PT Short Term Goal 1 (Week 2): = to LTG based on ELOS  Skilled Therapeutic Interventions/Progress Updates:    Pt received supine in bed and eager to participate with therapy despite reporting being fatigued after this AM's OT session. Vitals supine at beginning of session BP 132/80 HR 75 and pt denying lightheaded/dizziness sensation. Ambulated ~140ft, no AD, to day room with CGA demonstrating improved balance and weightshift. Performed 3 short bouts of standing stepping on Kinetron without UE support and CGA/min A for balance with manual facilitation and verbal/tactile cuing for improved weight shifting, especially to the L. Ambulated ~274ft to therapy gym, no AD, CGA for steadying with pt reporting "lightheadedness" vitals assessed in sitting BP 138/81 (MAP 99), HR 79bpm. Pt performed ambulatory obstacle course navigation of cone weaving, stepping over obstacles, stepping on airex, and stepping onto/off of 6" step all without AD- min A for balance with up to mod A when stepping on 6" step to prevent LOB. Vitals reassessed due to reports of lightheadedness BP 152/96 (MAP 110), HR 85bpm, seated break then BP 143/93 (MAP 106), HR 82bpm. Pt performed ladder drills of forward, side, and backwards stepping - required CGA for forwards, min A for side and up to mod A for backwards due to multiple LOB - focus on dynamic standing balance and B LE motor coordination. Performed backwards and lateral stepping strategy training beginning with verbal indicators of when to step progressed to no indicators with pt demonstrating adequate stepping strategy to maintain balance with close supervision/CGA and 2 instances of mod A to prevent LOB - cuing for increased RLE step length during  backwards stepping to improve regaining balance. Performed dynamic standing balance on foam board focusing on R and L weighshifting with manual facilitation and cuing for technique without UE support and CGA for steadying. Ambulated in hallway with sudden verbal cues for R and L turning progressed to turning 180degrees with no AD and no LOB all with CGA. Performed grapevine stepping at hallway rail without UE support with pt frequently requiring mod A and use of UEs to prevent LOB. Pt reported increased fatigue and increased lightheadedness with onset of L face numbness - vitals assessed in sitting BP 135/90 (MAP 100), HR 87bpm. After seated rest break pt ambulated ~241ft, no AD, to room with CGA/min A due to slight increased unsteadiness but pt reports of improving symptoms - RN and PA notified of symptoms and vitals. Pt left supine in bed with needs in reach and bed alarm on.  Therapy Documentation Precautions:  Precautions Precautions: Fall Restrictions Weight Bearing Restrictions: No  Vital Signs: Reported above Pain: No reports of pain throughout session.   Therapy/Group: Individual Therapy  Ginny Forth, PT, DPT 08/21/2018, 7:54 AM

## 2018-08-21 NOTE — Progress Notes (Addendum)
[place] PHYSICAL MEDICINE & REHABILITATION PROGRESS NOTE   Subjective/Complaints:  Discussed d.c. date and DM management  ROS- neg CP SOB, N/V/D   ROS: Patient denies CP, SOB, N/V/D Objective:   No results found. No results for input(s): WBC, HGB, HCT, PLT in the last 72 hours. No results for input(s): NA, K, CL, CO2, GLUCOSE, BUN, CREATININE, CALCIUM in the last 72 hours.  Intake/Output Summary (Last 24 hours) at 08/21/2018 0938 Last data filed at 08/21/2018 0700 Gross per 24 hour  Intake 240 ml  Output -  Net 240 ml     Physical Exam: Vital Signs Blood pressure 103/88, pulse 90, temperature 98.2 F (36.8 C), temperature source Oral, resp. rate 15, height 5\' 8"  (1.727 m), weight 85.4 kg, SpO2 99 %.   Constitutional: No distress . Vital signs reviewed. HEENT: EOMI, oral membranes moist Neck: supple Cardiovascular: RRR without murmur. No JVD    Respiratory: CTA Bilaterally without wheezes or rales. Normal effort    GI: BS +, non-tender, non-distended  Extremities: No clubbing, cyanosis, or edema Skin: No evidence of breakdown, no evidence of rash Neurologic:   motor strength is 5/5 in bilateral deltoid, bicep, tricep, grip, hip flexor, knee extensors, ankle dorsiflexor and plantar flexor Sensory exam normal sensation to light touch  in bilateral upper and lower extremities  Left hemiataxia, dysmetria finger-nose-finger  Musculoskeletal: Full range of motion in all 4 extremities. No joint swelling Psyc: pleasant but sl anxious  Assessment/Plan: 1. Functional deficits secondary to Left medullary infarct with Left hemiataxia, Left  which require 3+ hours per day of interdisciplinary therapy in a comprehensive inpatient rehab setting.  Physiatrist is providing close team supervision and 24 hour management of active medical problems listed below.  Physiatrist and rehab team continue to assess barriers to discharge/monitor patient progress toward functional and medical  goals  Care Tool:  Bathing  Bathing activity did not occur: Safety/medical concerns Body parts bathed by patient: Right arm, Left arm, Chest, Abdomen, Front perineal area, Buttocks, Right upper leg, Left upper leg, Right lower leg, Left lower leg, Face         Bathing assist Assist Level: Supervision/Verbal cueing     Upper Body Dressing/Undressing Upper body dressing Upper body dressing/undressing activity did not occur (including orthotics): Safety/medical concerns What is the patient wearing?: Pull over shirt    Upper body assist Assist Level: Independent    Lower Body Dressing/Undressing Lower body dressing    Lower body dressing activity did not occur: Safety/medical concerns What is the patient wearing?: Pants, Underwear/pull up     Lower body assist Assist for lower body dressing: Supervision/Verbal cueing     Toileting Toileting    Toileting assist Assist for toileting: Contact Guard/Touching assist Assistive Device Comment: walker   Transfers Chair/bed transfer  Transfers assist     Chair/bed transfer assist level: Supervision/Verbal cueing     Locomotion Ambulation   Ambulation assist      Assist level: Contact Guard/Touching assist Assistive device: Other (comment)(none) Max distance: 265ft    Walk 10 feet activity   Assist     Assist level: Contact Guard/Touching assist Assistive device: Other (comment)(none)   Walk 50 feet activity   Assist    Assist level: Contact Guard/Touching assist Assistive device: Other (comment)(none)    Walk 150 feet activity   Assist Walk 150 feet activity did not occur: Safety/medical concerns  Assist level: Contact Guard/Touching assist Assistive device: Other (comment)(none)    Walk 10 feet on uneven surface  activity   Assist Walk 10 feet on uneven surfaces activity did not occur: Safety/medical concerns         Wheelchair     Assist Will patient use wheelchair at discharge?:  No             Wheelchair 50 feet with 2 turns activity    Assist            Wheelchair 150 feet activity     Assist          Medical Problem List and Plan: 1.Functional and mobility deficitssecondary to leftposterolateralmedullary infarct -CIR PT, OT ,plan d/c 3/25             2. Antithrombotics: -DVT/anticoagulation:Pharmaceutical:Lovenox -antiplatelet therapy: ASA/Plavix X 3 weeks followed by Plavix alone 3. Pain Management:no severe pain at this time. 4. Mood:LCSW to follow for evaluation and support. -antipsychotic agents: N/A 5. Neuropsych: This patientiscapable of making decisions on hisown behalf. 6. Skin/Wound Care:LCSW to follow for evaluation and support. 7. Fluids/Electrolytes/Nutrition:Monitor I/O. Check lytes in am.  8. HTN: Monitor BP bid- avoid hypotension. Lisinopril on hold.  9. CKD: Baseline SCr 1.5--> improved to 1.25 10.T2DM-insulin dependent: Hgb A1c-9.6. At home on metformin 1000 mg bid--d/c due to CKD. And NPH 110 units with breakfast and 40 units with supper.   -Novolog meal coverage as well as SSI for tighter control.  CBG (last 3)  Recent Labs    08/20/18 1643 08/20/18 2114 08/21/18 0618  GLUCAP 148* 225* 152*  good in hospital control with goal 140-180, will increase NPH by 5U to achieve these goals, in range this am will monitor We will ask dietary to discuss food choices with the patient 11. Dyslipidemia: On Crestor.  12. Diabetic gastroparesis: Continue to hold Reglan/Carafate. No N/V during current hospitalization.monitor for recurrence 13. Leucocytosis: resolved afebrile                           14. Mild hypokalemia: improved    LOS: 9 days A FACE TO FACE EVALUATION WAS PERFORMED  Erick Colace 08/21/2018, 9:38 AM

## 2018-08-21 NOTE — Patient Care Conference (Signed)
Inpatient RehabilitationTeam Conference and Plan of Care Update Date: 08/21/2018   Time: 11:30 Am    Patient Name: Jeff [name]      Medical Record Number: [id number]  Date of Birth: 1950-12-07 Sex: Male         Room/Bed: 4W20C/4W20C-01 Payor Info: Payor: Multimedia programmer / Plan: [employer] MEDICARE / Product Type: *No Product type* /    Admitting Diagnosis: l CVA  Admit Date/Time:  08/12/2018  3:49 PM Admission Comments: No comment available   Primary Diagnosis:  <principal problem not specified> Principal Problem: <principal problem not specified>  Patient Active Problem List   Diagnosis Date Noted  . 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 Stroke (HCC) 08/08/2018  . Diabetes (HCC) 07/25/2015  . Neck injury 06/22/2015  . Hearing loss in right ear 10/14/2011  . Agent orange exposure 10/14/2011  . ADD (attention deficit disorder) 10/14/2011  . Hyperlipidemia 10/14/2011  . BMI 28.0-28.9,adult 10/14/2011    Expected Discharge Date: Expected Discharge Date: 08/27/18  Team Members Present: Physician leading conference: Dr. Claudette Laws Social Worker Present: Dossie Der, LCSW Nurse Present: Willey Blade, RN PT Present: Woodfin Ganja, PT OT Present: Perrin Maltese, OT SLP Present: Feliberto Gottron, SLP PPS Coordinator present : Fae Pippin     Current Status/Progress Goal Weekly Team Focus  Medical   Facial numbness improved, left hemiataxia improving stable blood pressures  Reduce fall risk and recurrent stroke risk manage diabetes  Discharge planning, caregiver training   Bowel/Bladder   (P) LBM 08/19/2018, Continent, has routine and prn meds  (P) Maintain continence  (P) Assess QS and  PRN toileting needs   Swallow/Nutrition/ Hydration             ADL's   bathing at shower level-supervision; LB dressing-CGA; functional transfers-CGA/min A; limited activity tolerance; CGA dynamic standing balance  independent w/AD overall  education,  activity tolerance, functional transfers, safety awareness, standing balance   Mobility   supervision bed mobility, CGA/min A transfers, min A gait without AD with decreased R weightshift and wide BOS  supervision  R weightshift in standing, gait, dynamic standing balance, higher level gait challenges, neuro re-ed   Communication             Safety/Cognition/ Behavioral Observations            Pain   (P) no c/o pain   (P) < 2/10       Skin                *See Care Plan and progress notes for long and short-term goals.     Barriers to Discharge  Current Status/Progress Possible Resolutions Date Resolved   Physician    Medical stability     Caregiver training will be starting  Continue rehab program      Nursing                  PT                    OT                  SLP                SW                Discharge Planning/Teaching Needs:  Wife and daughter here daily and observing in therapies. Plan to provide care at home  Team Discussion:  Progressing toward his goals of supervision level and independent with ADL's. Concerned regarding diabetes and MD increasing some. Fatigues easily and needs rest breaks. Balance better and not seen by Speech. Wife and daughter here daily  Revisions to Treatment Plan:  DC 3/25    Continued Need for Acute Rehabilitation Level of Care: The patient requires daily medical management by a physician with specialized training in physical medicine and rehabilitation for the following conditions: Daily direction of a multidisciplinary physical rehabilitation program to ensure safe treatment while eliciting the highest outcome that is of practical value to the patient.: Yes Daily medical management of patient stability for increased activity during participation in an intensive rehabilitation regime.: Yes Daily analysis of laboratory values and/or radiology reports with any subsequent need for medication adjustment of medical  intervention for : Neurological problems   I attest that I was present, lead the team conference, and concur with the assessment and plan of the team.   Lucy Chris 08/21/2018, 8:34 AM

## 2018-08-21 NOTE — Progress Notes (Signed)
Ordered abdominal binder, TED hose on

## 2018-08-22 ENCOUNTER — Inpatient Hospital Stay (HOSPITAL_COMMUNITY): Payer: Medicare Other

## 2018-08-22 ENCOUNTER — Inpatient Hospital Stay (HOSPITAL_COMMUNITY): Payer: Medicare Other | Admitting: Physical Therapy

## 2018-08-22 LAB — GLUCOSE, CAPILLARY
Glucose-Capillary: 120 mg/dL — ABNORMAL HIGH (ref 70–99)
Glucose-Capillary: 131 mg/dL — ABNORMAL HIGH (ref 70–99)
Glucose-Capillary: 284 mg/dL — ABNORMAL HIGH (ref 70–99)
Glucose-Capillary: 200 mg/dL — ABNORMAL HIGH (ref 70–99)

## 2018-08-22 NOTE — Progress Notes (Signed)
Physical Therapy Session Note  Patient Details  Name: Jeff [name] MRN: [id number] Date of Birth: 29-Sep-1950  Today's Date: 08/22/2018 PT Individual Time: 0902-1000 PT Individual Time Calculation (min): 58 min   Short Term Goals: Week 2:  PT Short Term Goal 1 (Week 2): = to LTG based on ELOS  Skilled Therapeutic Interventions/Progress Updates:   Pt received supine in bed and agreeable to PT. Supine>sit transfer with supervision assist and min cues for safety with dizziness. Pt reports need for urination. Gait training in room with min assist from PT into bathroom. Pt utilized RUE on rail for balance while standing to urinate.   Gait training with RW 2 x 137f and supervision assist. Min-moderate cues for gaze stabilization with gait training to reduce dizziness and improve safety.   PT instructed pt in dynamic balance training on Wii fit. Penguin slide. Table tilt. And bubble run. Moderate cues for improved gaze stabilization to reduce dizziness as well as improved use of ankle strategy to shift weight laterally and A/P. Increased assist required with Weight shift to the R from PT, pt noted to have improved R and anterior weight shift with increased repetitions.   PT educated Pt on proper speed and ROM with VOR exercises to maintain speed that allows only slight increase in dizziness and proper use of rest breaks.   [facility] returned to [facility] and all needs met.         Therapy Documentation Precautions:  Precautions Precautions: Fall Restrictions Weight Bearing Restrictions: No Pain:   denies   Therapy/Group: Individual Therapy  ALorie Phenix3/20/2020, 10:06 AM

## 2018-08-22 NOTE — Progress Notes (Signed)
Pt in bed denies any complaints. Denies need for BR assistance. In bed call light in reach 2 SR up bed alarm active

## 2018-08-22 NOTE — Progress Notes (Signed)
Occupational Therapy Session Note  Patient Details  Name: Jeff [name] MRN: [id number] Date of Birth: 06/18/1950  Today's Date: 08/22/2018 OT Individual Time: 1300-1345 OT Individual Time Calculation (min): 45 min    Short Term Goals: Week 1:  OT Short Term Goal 1 (Week 1): Pt will be able to stand at sink to shave with S to support his standing balance. OT Short Term Goal 1 - Progress (Week 1): Met OT Short Term Goal 2 (Week 1): Pt will be able to stand in shower with S to wash bottom with use of grab bars. OT Short Term Goal 2 - Progress (Week 1): Met OT Short Term Goal 3 (Week 1): Pt will be able to stand and pull pants over hips with CGA without UE support. OT Short Term Goal 3 - Progress (Week 1): Met Week 2:  OT Short Term Goal 1 (Week 2): STG=LTG 2/2 ELOS  Skilled Therapeutic Interventions/Progress Updates:  Pt found in recliner awaiting OT arrival and eager to start session. Pt ambulate to gym with no AD and MIN A for balance. Instructed pt on dynamic standing balance activity of shooting basketball into hoop. Pt complete task with MIN A to maintain balance. Increased challenged by having pt stand on foam mat to challenge dynamic standing balance. Pt complete task with MIN A. Instructed pt on BUE punching bag activity to challenge dynamic standing balance. Pt require MIN A to complete task and requested x3 rest break during activity. Facilitated functional mobility by instructing pt to ambulate  through obstacle course to facilitate balance and to encourage pt to slow down during functional mobility as pt tends to go too fast and experience a LOB. Pt complete task x2 trial with MIN A and MIN verbal cues to slow down during turns.  Pt experienced 1 LOB during first trial but was able to self correct. Pt ambulated back to room with MIN A  And no AD. Pt  left in bed with wife present, bed alarm on, and all needs met.    Therapy Documentation Precautions:  Precautions Precautions:  Fall Restrictions Weight Bearing Restrictions: No General:   Vital Signs: Therapy Vitals Temp: 98.1 F (36.7 C) Temp Source: Oral Pulse Rate: 78 Resp: 20 BP: 124/68 Patient Position (if appropriate): Lying Oxygen Therapy SpO2: 98 % O2 Device: Room Air Pain: pt with no c/o pain this session.   Therapy/Group: Individual Therapy  Mary K Clegg 08/22/2018, 2:01 PM  

## 2018-08-22 NOTE — Progress Notes (Signed)
[facility] PHYSICAL MEDICINE & REHABILITATION PROGRESS NOTE   Subjective/Complaints:  Dizziness when moving head during gaze stabilitation exercises  ROS- neg CP SOB, N/V/D   ROS: Patient denies CP, SOB, N/V/D Objective:   No results found. No results for input(s): WBC, HGB, HCT, PLT in the last 72 hours. Recent Labs    08/21/18 1244  NA 138  K 4.5  CL 104  CO2 26  GLUCOSE 100*  BUN 15  CREATININE 1.52*  CALCIUM 9.4    Intake/Output Summary (Last 24 hours) at 08/22/2018 0827 Last data filed at 08/21/2018 1700 Gross per 24 hour  Intake 480 ml  Output -  Net 480 ml     Physical Exam: Vital Signs Blood pressure 120/80, pulse 87, temperature 98.7 F (37.1 C), temperature source Oral, resp. rate 19, height 5\' 8"  (1.727 m), weight 85.4 kg, SpO2 97 %.   Constitutional: No distress . Vital signs reviewed. HEENT: EOMI, oral membranes moist Neck: supple Cardiovascular: RRR without murmur. No JVD    Respiratory: CTA Bilaterally without wheezes or rales. Normal effort    GI: BS +, non-tender, non-distended  Extremities: No clubbing, cyanosis, or edema Skin: No evidence of breakdown, no evidence of rash Neurologic:   motor strength is 5/5 in bilateral deltoid, bicep, tricep, grip, hip flexor, knee extensors, ankle dorsiflexor and plantar flexor Sensory exam normal sensation to light touch  in bilateral upper and lower extremities  Left hemiataxia, dysmetria finger-nose-finger  Musculoskeletal: Full range of motion in all 4 extremities. No joint swelling Psyc: pleasant but sl anxious  Assessment/Plan: 1. Functional deficits secondary to Left medullary infarct with Left hemiataxia, Left  which require 3+ hours per day of interdisciplinary therapy in a comprehensive inpatient rehab setting.  Physiatrist is providing close team supervision and 24 hour management of active medical problems listed below.  Physiatrist and rehab team continue to assess barriers to  discharge/monitor patient progress toward functional and medical goals  Care Tool:  Bathing  Bathing activity did not occur: Safety/medical concerns Body parts bathed by patient: Right arm, Left arm, Chest, Abdomen, Front perineal area, Buttocks, Right upper leg, Left upper leg, Right lower leg, Left lower leg, Face         Bathing assist Assist Level: Supervision/Verbal cueing     Upper Body Dressing/Undressing Upper body dressing Upper body dressing/undressing activity did not occur (including orthotics): Safety/medical concerns What is the patient wearing?: Pull over shirt    Upper body assist Assist Level: Independent    Lower Body Dressing/Undressing Lower body dressing    Lower body dressing activity did not occur: Safety/medical concerns What is the patient wearing?: Pants, Underwear/pull up     Lower body assist Assist for lower body dressing: Supervision/Verbal cueing     Toileting Toileting    Toileting assist Assist for toileting: Contact Guard/Touching assist Assistive Device Comment: walker   Transfers Chair/bed transfer  Transfers assist     Chair/bed transfer assist level: Contact Guard/Touching assist     Locomotion Ambulation   Ambulation assist      Assist level: Contact Guard/Touching assist Assistive device: Other (comment)(none) Max distance: >150'   Walk 10 feet activity   Assist     Assist level: Contact Guard/Touching assist Assistive device: Other (comment)(none )   Walk 50 feet activity   Assist    Assist level: Contact Guard/Touching assist Assistive device: Other (comment)(none)    Walk 150 feet activity   Assist Walk 150 feet activity did not occur: Safety/medical concerns  Assist level: Contact Guard/Touching assist Assistive device: Other (comment)(none)    Walk 10 feet on uneven surface  activity   Assist Walk 10 feet on uneven surfaces activity did not occur: Safety/medical concerns          Wheelchair     Assist Will patient use wheelchair at discharge?: No             Wheelchair 50 feet with 2 turns activity    Assist            Wheelchair 150 feet activity     Assist          Medical Problem List and Plan: 1.Functional and mobility deficitssecondary to leftposterolateralmedullary infarct -CIR PT, OT ,plan d/c 3/25             2. Antithrombotics: -DVT/anticoagulation:Pharmaceutical:Lovenox -antiplatelet therapy: ASA/Plavix X 3 weeks followed by Plavix alone 3. Pain Management:no severe pain at this time. 4. Mood:LCSW to follow for evaluation and support. -antipsychotic agents: N/A 5. Neuropsych: This patientiscapable of making decisions on hisown behalf. 6. Skin/Wound Care:LCSW to follow for evaluation and support. 7. Fluids/Electrolytes/Nutrition:Monitor I/O. Check lytes in am.  8. HTN: Monitor BP bid- avoid hypotension. Lisinopril on hold.  Vitals:   08/22/18 0547 08/22/18 0548  BP: 112/82 120/80  Pulse: 73 87  Resp:    Temp:    SpO2:    controlled orthostatic vitals normal as well 9. CKD: Baseline SCr 1.5--> improved to 1.25 10.T2DM-insulin dependent: Hgb A1c-9.6. At home on metformin 1000 mg bid--d/c due to CKD. And NPH 110 units with breakfast and 40 units with supper.   -Novolog meal coverage as well as SSI for tighter control.  CBG (last 3)  Recent Labs    08/21/18 1156 08/21/18 1711 08/22/18 0621  GLUCAP 111* 113* 120*  good control 3/20 We will ask dietary to discuss food choices with the patient 11. Dyslipidemia: On Crestor.  12. Diabetic gastroparesis: Continue to hold Reglan/Carafate. No N/V during current hospitalization.monitor for recurrence 13. Leucocytosis: resolved afebrile                           14. Mild hypokalemia: improved  14.  Central vestibular symptoms related to cerebellar infarct - cont stabilization exercise   LOS: 10 days A FACE TO FACE  EVALUATION WAS PERFORMED  Erick Colace 08/22/2018, 8:27 AM

## 2018-08-22 NOTE — Progress Notes (Signed)
Pt slept well no complaints

## 2018-08-22 NOTE — Progress Notes (Signed)
Physical Therapy Session Note  Patient Details  Name: Jeff [name] MRN: [id number] Date of Birth: 08-04-1950  Today's Date: 08/22/2018 PT Individual Time: 1105-1135 PT Individual Time Calculation (min): 30 min   Short Term Goals: Week 2:  PT Short Term Goal 1 (Week 2): = to LTG based on ELOS  Skilled Therapeutic Interventions/Progress Updates:    Pt received sitting in recliner and agreeable to therapy session. Pt performed sit<>stand, no AD, with CGA/supervision during therapy session. Ambulated ~227ft to/from therapy gym, no AD, with CGA and continuing to demonstrate improved equal weight shift during gait with no LOB. Performed biodex static standing balance tasks with focus on equal R/L weightbearing and weight shifting to all degrees of freedom with pt demonstrating increased difficulty shifting weight to the R and posterior requiring increased time, cuing for upright posture, and manual facilitation for weight shifting. Performed 4 square stepping balance challenge to external target on verbal command with pt requiring up to max A due to L LOB to maintain upright with tactile cuing and manual facilitation to regain balance between steps. Pt left sitting in recliner with needs in reach and seat alarm on.   Therapy Documentation Precautions:  Precautions Precautions: Fall Restrictions Weight Bearing Restrictions: No  Pain: No reports of pain during therapy session   Therapy/Group: Individual Therapy  Ginny Forth, PT, DPT 08/22/2018, 11:42 AM

## 2018-08-22 NOTE — Progress Notes (Addendum)
Occupational Therapy Session Note  Patient Details  Name: Jeff [name] MRN: [id number] Date of Birth: Mar 20, 1951  Today's Date: 08/22/2018 OT Individual Time:  0700- 0800 Total OT minutes-60 mins      Short Term Goals: Week 2:  OT Short Term Goal 1 (Week 2): STG=LTG 2/2 ELOS  Skilled Therapeutic Interventions/Progress Updates:    OT intervention with focus on functional amb with RW, standing balance, activity tolerance, and safety awareness to increase independence with BADLs.  Pt resting in bed upon arrival and requested to use toilet before shower.  Pt amb with RW to bathroom (CGA) and stood at toilet to void.  Pt transitioned to shower and requested to remain standing during shower. Pt required less time this morning to complete shower but did not exhibit any unsafe behaviors.  After bathing, pt commented that he was in too much of a hurry during shower and c/o increased dizziness 2/2 increased rapid head movements.  Educated pt on safety and avoiding getting in a hurry.  Pt commented he will have to "work on that." Pt used w/c to transition back to room for dressing.  Pt completed dressing task with sit<>stand from w/c. Pt completed bathing/dressing tasks at supervision level. Continued education on home safety and gaze stabilization exercises. Pt requested to return to bed to rest before next therapy session.  Pt remained in bed with all needs within reach and bed alarm activated.   Therapy Documentation Precautions:  Precautions Precautions: Fall Restrictions Weight Bearing Restrictions: No Pain:  Pt denies pain   Therapy/Group: Individual Therapy  Rich Brave 08/22/2018, 9:20 AM

## 2018-08-23 DIAGNOSIS — E1165 Type 2 diabetes mellitus with hyperglycemia: Secondary | ICD-10-CM

## 2018-08-23 DIAGNOSIS — E785 Hyperlipidemia, unspecified: Secondary | ICD-10-CM

## 2018-08-23 LAB — GLUCOSE, CAPILLARY
GLUCOSE-CAPILLARY: 134 mg/dL — AB (ref 70–99)
Glucose-Capillary: 217 mg/dL — ABNORMAL HIGH (ref 70–99)
Glucose-Capillary: 106 mg/dL — ABNORMAL HIGH (ref 70–99)
Glucose-Capillary: 213 mg/dL — ABNORMAL HIGH (ref 70–99)

## 2018-08-23 NOTE — Progress Notes (Signed)
Jeff [name] is a 68 y.o. male 1950-11-18 [id number]  Subjective: No new complaints. No new problems. Slept well. Feeling OK.  Objective: Vital signs in last 24 hours: Temp:  [97.9 F (36.6 C)-98.1 F (36.7 C)] 98 F (36.7 C) (03/21 0432) Pulse Rate:  [74-78] 78 (03/21 0432) Resp:  [18-20] 18 (03/21 0432) BP: (124-130)/(68-88) 130/77 (03/21 0432) SpO2:  [98 %-99 %] 98 % (03/21 0434) Weight change:  Last BM Date: 08/23/18  Intake/Output from previous day: 03/20 0701 - 03/21 0700 In: 240 [P.O.:240] Out: -  Last cbgs: CBG (last 3)  Recent Labs    08/22/18 2146 08/23/18 0630 08/23/18 1200  GLUCAP 200* 134* 217*     Physical Exam General: No apparent distress.  In a good mood HEENT: not dry Lungs: Normal effort. Lungs clear to auscultation, no crackles or wheezes. Cardiovascular: Regular rate and rhythm, no edema Abdomen: S/NT/ND; BS(+) Musculoskeletal:  unchanged Neurological: No new neurological deficits Wounds: N/A    Skin: clear  Aging changes Mental state: Alert, oriented, cooperative    Lab Results: BMET    Component Value Date/Time   NA 138 08/21/2018 1244   NA 139 07/29/2018 0947   K 4.5 08/21/2018 1244   CL 104 08/21/2018 1244   CO2 26 08/21/2018 1244   GLUCOSE 100 (H) 08/21/2018 1244   BUN 15 08/21/2018 1244   BUN 13 07/29/2018 0947   CREATININE 1.52 (H) 08/21/2018 1244   CREATININE 1.30 (H) 08/17/2015 1557   CALCIUM 9.4 08/21/2018 1244   GFRNONAA 46 (L) 08/21/2018 1244   GFRNONAA 57 (L) 11/22/2013 1730   GFRAA 53 (L) 08/21/2018 1244   GFRAA 66 11/22/2013 1730   CBC    Component Value Date/Time   WBC 7.2 08/18/2018 0634   RBC 5.12 08/18/2018 0634   HGB 14.4 08/18/2018 0634   HGB 15.9 07/29/2018 0947   HCT 42.7 08/18/2018 0634   HCT 46.4 07/29/2018 0947   PLT 211 08/18/2018 0634   PLT 294 07/29/2018 0947   MCV 83.4 08/18/2018 0634   MCV 84 07/29/2018 0947   MCH 28.1 08/18/2018 0634   MCHC 33.7 08/18/2018 0634   RDW 12.3 08/18/2018  0634   RDW 13.2 07/29/2018 0947   LYMPHSABS 1.9 08/13/2018 0535   LYMPHSABS 1.9 07/29/2018 0947   MONOABS 0.8 08/13/2018 0535   EOSABS 0.4 08/13/2018 0535   EOSABS 0.5 (H) 07/29/2018 0947   BASOSABS 0.1 08/13/2018 0535   BASOSABS 0.1 07/29/2018 0947    Studies/Results: No results found.  Medications: I have reviewed the patient's current medications.  Assessment/Plan:   1.  Status post left posterior lateral medullary infarct.  CIR 2.  DVT prophylaxis with Lovenox 3.  Pain management with PRN Tylenol 4.  Emotional support 5.  Hypertension.  Lisinopril is on hold. 6.  Chronic kidney disease.  Monitoring creatinine 7.  Type 2 diabetes insulin-dependent.  Stopped metformin due to CKD.  On NPH insulin 8.  Dyslipidemia on Crestor 9.  Diabetic gastroparesis.  Continue to monitor for symptoms.  Reglan and Carafate are on hold 10.  Leukocytosis.  Monitoring 11.  Hypokalemia.  Improved      Length of stay, days: 11  Sonda Primes , MD 08/23/2018, 1:01 PM

## 2018-08-24 ENCOUNTER — Inpatient Hospital Stay (HOSPITAL_COMMUNITY): Payer: Medicare Other

## 2018-08-24 DIAGNOSIS — E1159 Type 2 diabetes mellitus with other circulatory complications: Secondary | ICD-10-CM

## 2018-08-24 LAB — GLUCOSE, CAPILLARY
Glucose-Capillary: 132 mg/dL — ABNORMAL HIGH (ref 70–99)
Glucose-Capillary: 142 mg/dL — ABNORMAL HIGH (ref 70–99)
Glucose-Capillary: 172 mg/dL — ABNORMAL HIGH (ref 70–99)
Glucose-Capillary: 246 mg/dL — ABNORMAL HIGH (ref 70–99)

## 2018-08-24 NOTE — Progress Notes (Addendum)
Jeff [name] is a 68 y.o. male [birth year] [id number]  Subjective: No new complaints. No new problems. Slept well. Feeling OK.  Objective: Vital signs in last 24 hours: Temp:  [97.6 F (36.4 C)-98.3 F (36.8 C)] 97.8 F (36.6 C) (03/22 0335) Pulse Rate:  [67-87] 67 (03/22 0335) Resp:  [16-20] 16 (03/22 0335) BP: (125-144)/(77-89) 125/80 (03/22 0335) SpO2:  [95 %-98 %] 95 % (03/22 0335) Weight change:  Last BM Date: 08/23/18  Intake/Output from previous day: 03/21 0701 - 03/22 0700 In: 924 [P.O.:924] Out: -  Last cbgs: CBG (last 3)  Recent Labs    08/23/18 1643 08/23/18 2120 08/24/18 1137  GLUCAP 106* 213* 142*     Physical Exam General: No apparent distress.  His mood is excellent HEENT: not dry Lungs: Normal effort. Lungs clear to auscultation, no crackles or wheezes. Cardiovascular: Regular rate and rhythm, no edema Abdomen: S/NT/ND; BS(+) Musculoskeletal:  unchanged Neurological: No new neurological deficits Wounds: N/A    Skin: clear  Aging changes Mental state: Alert, oriented, cooperative    Lab Results: BMET    Component Value Date/Time   NA 138 08/21/2018 1244   NA 139 07/29/2018 0947   K 4.5 08/21/2018 1244   CL 104 08/21/2018 1244   CO2 26 08/21/2018 1244   GLUCOSE 100 (H) 08/21/2018 1244   BUN 15 08/21/2018 1244   BUN 13 07/29/2018 0947   CREATININE 1.52 (H) 08/21/2018 1244   CREATININE 1.30 (H) 08/17/2015 1557   CALCIUM 9.4 08/21/2018 1244   GFRNONAA 46 (L) 08/21/2018 1244   GFRNONAA 57 (L) 11/22/2013 1730   GFRAA 53 (L) 08/21/2018 1244   GFRAA 66 11/22/2013 1730   CBC    Component Value Date/Time   WBC 7.2 08/18/2018 0634   RBC 5.12 08/18/2018 0634   HGB 14.4 08/18/2018 0634   HGB 15.9 07/29/2018 0947   HCT 42.7 08/18/2018 0634   HCT 46.4 07/29/2018 0947   PLT 211 08/18/2018 0634   PLT 294 07/29/2018 0947   MCV 83.4 08/18/2018 0634   MCV 84 07/29/2018 0947   MCH 28.1 08/18/2018 0634   MCHC 33.7 08/18/2018 0634   RDW 12.3  08/18/2018 0634   RDW 13.2 07/29/2018 0947   LYMPHSABS 1.9 08/13/2018 0535   LYMPHSABS 1.9 07/29/2018 0947   MONOABS 0.8 08/13/2018 0535   EOSABS 0.4 08/13/2018 0535   EOSABS 0.5 (H) 07/29/2018 0947   BASOSABS 0.1 08/13/2018 0535   BASOSABS 0.1 07/29/2018 0947    Studies/Results: No results found.  Medications: I have reviewed the patient's current medications.  Assessment/Plan: 1.  Status post left posterior lateral medullary infarct.  Continue with CIR 2.  DVT prophylaxis with subcutaneous Lovenox 3.  Pain management with PRN Tylenol 4.  Emotional support 5.  Hypertension. Continue with holding lisinopril 6.  Chronic kidney disease.  Monitor creatinine 7.  Type 2 diabetes, insulin-dependent.  Metformin was stopped due to CKD.  On NPH insulin 8.  Dyslipidemia-continue with Crestor 9.  Medical practice.  Continue to monitor for symptoms.  Reglan and Carafate are on hold. 10.  Leukocytosis.  Will recheck CBC 11.  Hypokalemia.  Improved.  Check be met 12.  CVA.  On Plavix      Length of stay, days: 12  Walker Kehr , MD 08/24/2018, 1:43 PM

## 2018-08-24 NOTE — Progress Notes (Signed)
Physical Therapy Session Note  Patient Details  Name: Jeff [name] MRN: [id number] Date of Birth: 26-Jan-1951  Today's Date: 08/24/2018 PT Individual Time: 0903-1000 PT Individual Time Calculation (min): 57 min   Short Term Goals: Week 2:  PT Short Term Goal 1 (Week 2): = to LTG based on ELOS  Skilled Therapeutic Interventions/Progress Updates:    Pt supine in bed upon PT arrival, agreeable to therapy tx and denies pain. Pt transferred to sitting EOB with supervision and donned shoes, tied shoes with supervision. Pt ambulated to the gym with CGA, no AD x 150 ft. Pt worked on Therapist, occupational this session and R lateral weightshiffting. BP sitting on mat in therapy gym: 147/103, 75 bpm. Pt attempted to walk tandem in hallway, mod-max assist to correct L lateral LOB. Pt worked on forwards gait, backwards gait and gait with head turns for dynamic balance, CGA-min assist. Pt used biodex for limits of stability training and weightbearing/shifting feedback, on biodex pt performed R lateral shift<>midline x 10 with feedback on screen. BP after activity 140/100. Pt worked on R lateral weightshift and dynamic balance to perform toe taps to 4 inch step x 2 trials tapping forwards and x 2 trials L lateral taps. Pt performed standing hip abduction at parallel bars with L LE x 10. Pt ambulated back to room and left with needs in reach and bed alarm set.   Therapy Documentation Precautions:  Precautions Precautions: Fall Restrictions Weight Bearing Restrictions: No  Therapy/Group: Individual Therapy  Cresenciano Genre, PT, DPT 08/24/2018, 8:03 AM

## 2018-08-25 ENCOUNTER — Inpatient Hospital Stay (HOSPITAL_COMMUNITY): Payer: Self-pay | Admitting: Physical Therapy

## 2018-08-25 ENCOUNTER — Inpatient Hospital Stay (HOSPITAL_COMMUNITY): Payer: Medicare Other

## 2018-08-25 ENCOUNTER — Inpatient Hospital Stay (HOSPITAL_COMMUNITY): Payer: Self-pay

## 2018-08-25 LAB — GLUCOSE, CAPILLARY
GLUCOSE-CAPILLARY: 193 mg/dL — AB (ref 70–99)
Glucose-Capillary: 162 mg/dL — ABNORMAL HIGH (ref 70–99)
Glucose-Capillary: 114 mg/dL — ABNORMAL HIGH (ref 70–99)
Glucose-Capillary: 189 mg/dL — ABNORMAL HIGH (ref 70–99)

## 2018-08-25 LAB — CBC
HCT: 41.3 % (ref 39.0–52.0)
Hemoglobin: 13.9 g/dL (ref 13.0–17.0)
MCH: 28.1 pg (ref 26.0–34.0)
MCHC: 33.7 g/dL (ref 30.0–36.0)
MCV: 83.4 fL (ref 80.0–100.0)
Platelets: 212 10*3/uL (ref 150–400)
RBC: 4.95 MIL/uL (ref 4.22–5.81)
RDW: 12.5 % (ref 11.5–15.5)
WBC: 7.7 10*3/uL (ref 4.0–10.5)
nRBC: 0 % (ref 0.0–0.2)

## 2018-08-25 LAB — BASIC METABOLIC PANEL
Anion gap: 9 (ref 5–15)
BUN: 14 mg/dL (ref 8–23)
CO2: 24 mmol/L (ref 22–32)
Calcium: 9.1 mg/dL (ref 8.9–10.3)
Chloride: 105 mmol/L (ref 98–111)
Creatinine, Ser: 1.43 mg/dL — ABNORMAL HIGH (ref 0.61–1.24)
GFR calc Af Amer: 58 mL/min — ABNORMAL LOW (ref >60)
GFR calc non Af Amer: 50 mL/min — ABNORMAL LOW (ref >60)
Glucose, Bld: 166 mg/dL — ABNORMAL HIGH (ref 70–99)
Potassium: 3.8 mmol/L (ref 3.5–5.1)
Sodium: 138 mmol/L (ref 135–145)

## 2018-08-25 NOTE — Progress Notes (Signed)
Physical Therapy Session Note  Patient Details  Name: Jeff [name] MRN: [id number] Date of Birth: [birth year]  Today's Date: 08/25/2018 PT Individual Time: 1300-1332 PT Individual Time Calculation (min): 32 min   Short Term Goals: Week 1:  PT Short Term Goal 1 (Week 2): = to LTG based on ELOS  Skilled Therapeutic Interventions/Progress Updates:    Patient in recliner in room and agreeable to PT.  Reports practicing seated gaze stabilization as instructed by PT.  Noted pt remained recliner with head and trunk supported, so educated to perform seated on edge of chair or bed with target on wall about 5' away.  Also educated in purpose of doing for habituation so important to allow it to increase his dizziness, but not more than 6-7/10 to avoid nausea.  Patient performed with vertical head movement and reported no dizziness, educated not to perform since purpose is for habituation.  Sit<>stand x 5 with dizziness 5-6/10.  Seated rest with education to rest between activities to allow symptoms to resolve.  Sit <>supine x 3 reps with dizziness 4-5/10 and pt with LOB to L cues for correction.  Educated not to perform on his own as well as sit<>stand to avoid LOB/falling.  Performed seated head rotation x 5 dizziness 4/10, educated okay in addition to eye exercise to do on his own.  Completed vestibular assessment as noted below.  Left pt in supine with bed alarm on  and call bell in reach.   Therapy Documentation Precautions:  Precautions Precautions: Fall Restrictions Weight Bearing Restrictions: No  Pain: Pain Assessment Pain Scale: 0-10 Pain Score: 0-No pain Pain Location: Head Pain Orientation: Right;Left Pain Descriptors / Indicators: Aching Pain Intervention(s): Medication (See eMAR)   Other Treatments:   Vestibular Assessment - 08/25/18 1352      Vestibular Assessment   General Observation  Reports working on exercises for eyes and demonstrated seated in recliner leaning back with trunk  and head and reports dizziness about 4/10 after 10 head turns.       Symptom Behavior   Type of Dizziness   Imbalance;Lightheadedness;"Funny feeling in head"    Frequency of Dizziness  intermittent    Duration of Dizziness  seconds to a minute    Symptom Nature  Motion provoked;Intermittent    Aggravating Factors  Turning head quickly;Sit to stand;Supine to sit    Relieving Factors  Head stationary;Rest;Closing eyes    Progression of Symptoms  Better      Oculomotor Exam   Oculomotor Alignment  Normal   but with oculomotor testing had dysconjugate gaze in [place]   Ocular ROM  limited due to strain, history of diabetic neuropathy   limited with convergence and with far lateral movement   Spontaneous  Absent    Gaze-induced   Absent    Smooth Pursuits  Saccades   noted more in R field   Saccades  Intact       Therapy/Group: Individual Therapy  Elray Mcgregor  [place], [place] 08/25/2018, 4:22 PM

## 2018-08-25 NOTE — Progress Notes (Signed)
Occupational Therapy Session Note  Patient Details  Name: Jeff [name] MRN: [id number] Date of Birth: 11-01-1950  Today's Date: 08/25/2018 OT Individual Time: 0700-0810 OT Individual Time Calculation (min): 70 min    Short Term Goals: Week 2:  OT Short Term Goal 1 (Week 2): STG=LTG 2/2 ELOS  Skilled Therapeutic Interventions/Progress Updates:    OT intervention with focus on functional amb without AD, standing balance, activity tolerance, BADLs, safety awareness, and energy conservation to increase independence with BADLs. Pt resting in bed upon arrival and agreeable to therapy. Pt amb to sink and stood at sink to complete shaving task with electric razor.  Pt amb into bathroom and stood in shower for bathing.  Pt able to bend at waist to bathe B lower legs.  Pt uses grab bars for safety when standing.  Pt returned to room and completed dressing with sit<>stand from EOB. Pt continues to fatigue quickly but commented that he feels like his endurance is getting better.  Continued education regarding energy conservation.  Pt required extended rest break following bathing/dressing tasks.  Pt stood at sink to brush teeth.  Pt requested to return to bed and remained in bed with all needs within reach and bed alarm activated.   Therapy Documentation Precautions:  Precautions Precautions: Fall Restrictions Weight Bearing Restrictions: No   Pain:  Pt denies pain   Therapy/Group: Individual Therapy  Rich Brave 08/25/2018, 9:53 AM

## 2018-08-25 NOTE — Progress Notes (Signed)
[place] PHYSICAL MEDICINE & REHABILITATION PROGRESS NOTE   Subjective/Complaints:  No issues overnite, concerned about outpatient rehab closing  ROS- neg CP SOB, N/V/D   ROS: Patient denies CP, SOB, N/V/D Objective:   No results found. Recent Labs    08/25/18 0557  WBC 7.7  HGB 13.9  HCT 41.3  PLT 212   Recent Labs    08/25/18 0557  NA 138  K 3.8  CL 105  CO2 24  GLUCOSE 166*  BUN 14  CREATININE 1.43*  CALCIUM 9.1    Intake/Output Summary (Last 24 hours) at 08/25/2018 0800 Last data filed at 08/24/2018 2300 Gross per 24 hour  Intake 594 ml  Output -  Net 594 ml     Physical Exam: Vital Signs Blood pressure 119/76, pulse 68, temperature 98.2 F (36.8 C), resp. rate 16, height 5\' 8"  (1.727 m), weight 85.4 kg, SpO2 100 %.   Constitutional: No distress . Vital signs reviewed. HEENT: EOMI, oral membranes moist Neck: supple Cardiovascular: RRR without murmur. No JVD    Respiratory: CTA Bilaterally without wheezes or rales. Normal effort    GI: BS +, non-tender, non-distended  Extremities: No clubbing, cyanosis, or edema Skin: No evidence of breakdown, no evidence of rash Neurologic:   motor strength is 5/5 in bilateral deltoid, bicep, tricep, grip, hip flexor, knee extensors, ankle dorsiflexor and plantar flexor Sensory exam normal sensation to light touch  in bilateral upper and lower extremities  Left hemiataxia, dysmetria finger-nose-finger  Musculoskeletal: Full range of motion in all 4 extremities. No joint swelling Psyc: pleasant but sl anxious  Assessment/Plan: 1. Functional deficits secondary to Left medullary infarct with Left hemiataxia, Left  which require 3+ hours per day of interdisciplinary therapy in a comprehensive inpatient rehab setting.  Physiatrist is providing close team supervision and 24 hour management of active medical problems listed below.  Physiatrist and rehab team continue to assess barriers to discharge/monitor patient  progress toward functional and medical goals  Care Tool:  Bathing  Bathing activity did not occur: Safety/medical concerns Body parts bathed by patient: Right arm, Left arm, Chest, Abdomen, Front perineal area, Buttocks, Right upper leg, Left upper leg, Right lower leg, Left lower leg, Face         Bathing assist Assist Level: Supervision/Verbal cueing     Upper Body Dressing/Undressing Upper body dressing Upper body dressing/undressing activity did not occur (including orthotics): Safety/medical concerns What is the patient wearing?: Pull over shirt    Upper body assist Assist Level: Independent    Lower Body Dressing/Undressing Lower body dressing    Lower body dressing activity did not occur: Safety/medical concerns What is the patient wearing?: Pants, Underwear/pull up     Lower body assist Assist for lower body dressing: Supervision/Verbal cueing     Toileting Toileting    Toileting assist Assist for toileting: Contact Guard/Touching assist Assistive Device Comment: walker   Transfers Chair/bed transfer  Transfers assist     Chair/bed transfer assist level: Contact Guard/Touching assist     Locomotion Ambulation   Ambulation assist      Assist level: Contact Guard/Touching assist Assistive device: (no AD ) Max distance: 200 ft   Walk 10 feet activity   Assist     Assist level: Contact Guard/Touching assist Assistive device: Other (comment)(no AD)   Walk 50 feet activity   Assist    Assist level: Contact Guard/Touching assist Assistive device: Other (comment)(no AD)    Walk 150 feet activity   Assist Walk  150 feet activity did not occur: Safety/medical concerns  Assist level: Contact Guard/Touching assist Assistive device: Other (comment)(no AD)    Walk 10 feet on uneven surface  activity   Assist Walk 10 feet on uneven surfaces activity did not occur: Safety/medical concerns         Wheelchair     Assist Will  patient use wheelchair at discharge?: No             Wheelchair 50 feet with 2 turns activity    Assist            Wheelchair 150 feet activity     Assist          Medical Problem List and Plan: 1.Functional and mobility deficitssecondary to leftposterolateralmedullary infarct -CIR PT, OT ,plan d/c 3/25            Ask for [facility] PT, OT 2. Antithrombotics: -DVT/anticoagulation:Pharmaceutical:Lovenox -antiplatelet therapy: ASA/Plavix X 3 weeks followed by Plavix alone 3. Pain Management:no severe pain at this time. 4. Mood:LCSW to follow for evaluation and support. -antipsychotic agents: N/A 5. Neuropsych: This patientiscapable of making decisions on hisown behalf. 6. Skin/Wound Care:LCSW to follow for evaluation and support. 7. Fluids/Electrolytes/Nutrition:Monitor I/O. Check lytes in am.  8. HTN: Monitor BP bid- avoid hypotension. Lisinopril on hold.  Vitals:   08/24/18 1934 08/25/18 0427  BP: (!) 144/76 119/76  Pulse: 75 68  Resp:  16  Temp:  98.2 F (36.8 C)  SpO2: 96% 100%  controlled orthostatic vitals normal as well 9. CKD: Baseline SCr 1.5-->stable at 1.43 10.T2DM-insulin dependent: Hgb A1c-9.6. At home on metformin 1000 mg bid--d/c due to CKD. And NPH 110 units with breakfast and 40 units with supper.   -Novolog meal coverage as well as SSI for tighter control.  CBG (last 3)  Recent Labs    08/24/18 1653 08/24/18 2109 08/25/18 0616  GLUCAP 172* 246* 162*  good control 3/23, elevated last noc We will ask dietary to discuss food choices with the patient 11. Dyslipidemia: On Crestor.  12. Diabetic gastroparesis: Continue to hold Reglan/Carafate. No N/V during current hospitalization.monitor for recurrence 13. Leucocytosis: resolved afebrile                           14. Mild hypokalemia: improved  14.  Central vestibular symptoms related to cerebellar infarct - cont stabilization exercise    LOS: 13 days A FACE TO FACE EVALUATION WAS PERFORMED  Erick Colace 08/25/2018, 8:00 AM

## 2018-08-25 NOTE — Progress Notes (Addendum)
Physical Therapy Session Note  Patient Details  Name: Jeff [name] MRN: [id number] Date of Birth: 05-17-1951  Today's Date: 08/25/2018 PT Individual Time: 1104-1204 PT Individual Time Calculation (min): 60 min   Short Term Goals: Week 2:  PT Short Term Goal 1 (Week 2): = to LTG based on ELOS  Skilled Therapeutic Interventions/Progress Updates:   Pt received supine in bed and agreeable to therapy session. Performed sit<>stand transfers throughout session with supervision/CGA for safety/steadying. Ambulated to/from therapy gym, no AD, with CGA for steadying. Pt educated on floor transfers, safety in the event of a fall, and the proper technique to ensure safe return to chair. Performed floor to mat transfer with supervision and cuing for technique. Backwards ambulation ~29ft x 2 with CGA and ~3 instances of min A for balance due to L posterolateral LOB.Marland Kitchen Performed 4 square stepping to external target on verbal cuing progressed to 2 cues and steps - verbal/tactile and visual cuing for improved R weight shift between steps to regain balance - pt progressed from requiring mod A due to continued L lateral LOB to CGA and occasional min A for balance. Performed repeated, alternating cone taps with progression from mod A for balance due to repeated L lateral LOB to CGA and occasional min A due to pt continuing to demonstrate increased difficulty with R LE stance phase to tap with L LE. Performed stair navigation of ascending/descending 4 steps using unilateral handrail with reciprocal steps and 1 posterior LOB requiring min A for recovery progressed 4 steps x2 without handrails with CGA and transitioned to step-to pattern on descent with L LE leading. Pt educated on proper family positioning to provide assist on stairs at home. Pt performed standing balance task of picking up object from floor with min A for balance for initial reaching task due to minor forward LOB progressed to CGA. Performed higher level  ambulation balance tasks of walking with sudden R and L turns progressed to turning 180 degrees on verbal cues with CGA throughout and pt reporting turning 180degrees caused onset of "dizziness." Pt educated on performing gaze stabilization exercise in sitting with plan background, quiet environment and to modify the speed of head turns for decreased symptom provocation. Performed R and L head turn gaze stabilization x 10. Pt left sitting in recliner with needs in reach and seat alarm on.   Therapy Documentation Precautions:  Precautions Precautions: Fall Restrictions Weight Bearing Restrictions: No  Pain: Denies pain during session.   Therapy/Group: Individual Therapy  Ginny Forth, PT, DPT 08/25/2018, 7:59 AM

## 2018-08-25 NOTE — Progress Notes (Signed)
Physical Therapy Session Note  Patient Details  Name: Jeff [name] MRN: [id number] Date of Birth: 06/14/1950  Today's Date: 08/25/2018 PT Individual Time: 0915-1000 PT Individual Time Calculation (min): 45 min   Short Term Goals: Week 2:  PT Short Term Goal 1 (Week 2): = to LTG based on ELOS  Skilled Therapeutic Interventions/Progress Updates:    Pt using bathroom upon PT arrival, agreeable to therapy tx and denies pain. Pt ambulated to sink without AD and CGA, washed hands with supervision for standing balance. Pt ambulated to the gym without AD and CGA x 150 ft. Pt worked on dynamic standing balance this session without UE support including the following: ambulating while weaving through cones, stepping on/over airex pad, lateral weightshifting on rocker board, balancing on bosu and weightshifting on bosu, stepping through agility ladder, and backwards ambulation. Pt ambulated backwards back to room x 200 ft with B HHA, CGA and transferred to bed. Pt performed bed mobility independently and left supine with needs in reach and bed alarm set.   Therapy Documentation Precautions:  Precautions Precautions: Fall Restrictions Weight Bearing Restrictions: No    Therapy/Group: Individual Therapy  Cresenciano Genre, PT, DPT 08/25/2018, 7:42 AM

## 2018-08-25 NOTE — Progress Notes (Signed)
Slept good. Aggravated with "all" the insulin shots. Ambulates to BR to void. Continent of B & B. No unsafe behaviors

## 2018-08-26 ENCOUNTER — Inpatient Hospital Stay (HOSPITAL_COMMUNITY): Payer: Self-pay

## 2018-08-26 ENCOUNTER — Inpatient Hospital Stay (HOSPITAL_COMMUNITY): Payer: Self-pay | Admitting: Physical Therapy

## 2018-08-26 ENCOUNTER — Inpatient Hospital Stay (HOSPITAL_COMMUNITY): Payer: Medicare Other

## 2018-08-26 LAB — GLUCOSE, CAPILLARY
GLUCOSE-CAPILLARY: 121 mg/dL — AB (ref 70–99)
Glucose-Capillary: 75 mg/dL (ref 70–99)
Glucose-Capillary: 224 mg/dL — ABNORMAL HIGH (ref 70–99)
Glucose-Capillary: 165 mg/dL — ABNORMAL HIGH (ref 70–99)
Glucose-Capillary: 98 mg/dL (ref 70–99)

## 2018-08-26 MED ORDER — INSULIN NPH (HUMAN) (ISOPHANE) 100 UNIT/ML ~~LOC~~ SUSP
40.0000 [IU] | Freq: Every day | SUBCUTANEOUS | Status: DC
  Administered 2018-08-26: 40 [IU] via SUBCUTANEOUS
  Filled 2018-08-26: qty 10

## 2018-08-26 NOTE — Progress Notes (Signed)
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  Pt stated that he had received exceptional service while on rehab and was appreciative of the care and concern he has received from staff.  During my assessment last week, pt shared in great detail his desire to participate in a job or volunteer role while acknowledging barriers to this.  LRT introduced and emphasized activity analysis and introduced potential activity modifications.  Pt stated appreciation of this information and today discussed his desire to volunteer in some capacity.   Therapy/Group: Individual Therapy  [name] 08/26/2018, 4:04 PM

## 2018-08-26 NOTE — Progress Notes (Signed)
Occupational Therapy Discharge Summary  Patient Details  Name: Jeff [name] MRN: [id number] Date of Birth: 11-Oct-1950  Patient has met 12 of 11 long term goals due to improved activity tolerance, improved balance, postural control, ability to compensate for deficits and improved coordination.  Pt made excellent progress with BADLs during this admission.  Pt completes bathing/dressing and toileting tasks at mod I/I level.  Pt mod I for all functional transfers. Pt continues to fatigue quickly and requires multiple rest breaks during ADLs.  Pt exhibits no unsafe behaviors and has verbalized understanding of all recommendations.  Family has been present for therapy sessions. Patient to discharge at overall Modified Independent level.  Patient's care partner is independent to provide the necessary physical assistance at discharge.    Recommendation:  No f/u services recommended.  Equipment: Tub Producer, television/film/video  Reasons for discharge: treatment goals met  Patient/family agrees with progress made and goals achieved: Yes  OT Discharge Vision Baseline Vision/History: No visual deficits(history of macular retinopathy) Patient Visual Report: No change from baseline Vision Assessment?: No apparent visual deficits Perception  Perception: Within Functional Limits Praxis Praxis: Intact Cognition Overall Cognitive Status: Within Functional Limits for tasks assessed Arousal/Alertness: Awake/alert Orientation Level: Oriented X4 Attention: Focused;Sustained Focused Attention: Appears intact Sustained Attention: Appears intact Memory: Appears intact Awareness: Appears intact Problem Solving: Appears intact Safety/Judgment: Appears intact Sensation Sensation Light Touch: Impaired Detail Central sensation comments: numbness left facial lateral to L eye; improved since admission Hot/Cold: Appears Intact Proprioception: Impaired by gross assessment Stereognosis: Appears Intact Coordination Gross  Motor Movements are Fluid and Coordinated: No Fine Motor Movements are Fluid and Coordinated: No Motor  Motor Motor: Ataxia;Abnormal postural alignment and control Motor - Skilled Clinical Observations: LUE improved    Trunk/Postural Assessment  Cervical Assessment Cervical Assessment: Within Functional Limits Thoracic Assessment Thoracic Assessment: Within Functional Limits Lumbar Assessment Lumbar Assessment: Exceptions to WFL(posterior pelvic tilt) Postural Control Postural Control: Deficits on evaluation Trunk Control: L lateral lean in standing improved Righting Reactions: delayed  Balance Static Sitting Balance Static Sitting - Level of Assistance: 6: Modified independent (Device/Increase time) Dynamic Sitting Balance Dynamic Sitting - Level of Assistance: 6: Modified independent (Device/Increase time) Extremity/Trunk Assessment RUE Assessment RUE Assessment: Within Functional Limits LUE Assessment LUE Assessment: Within Functional Limits   Leroy Libman 08/26/2018, 7:22 AM

## 2018-08-26 NOTE — Progress Notes (Signed)
Physical Therapy Discharge Summary  Patient Details  Name: Jeff [name] MRN: [id number] Date of Birth: [birth year]  Today's Date: 08/26/2018 PT Individual Time: 3300-7622 PT Individual Time Calculation (min): 61 min    Patient has met 9 of 9 long term goals due to improved activity tolerance, improved balance, improved postural control, increased strength, ability to compensate for deficits, improved awareness and improved coordination.  Patient to discharge at an ambulatory level Supervision.   Patient's care partner is independent to provide the necessary physical assistance at discharge.  Reasons goals not met: Patient met all goals.  Recommendation:  Patient will benefit from ongoing skilled PT services in outpatient setting; due to current clinic status pt will be receiving HHPT, to continue to advance safe functional mobility, address ongoing impairments in motor coordination, static and dynamic standing balance, higher level gait, vestibular impairments, and minimize fall risk.  Equipment: Rolling walker  Reasons for discharge: treatment goals met  Patient/family agrees with progress made and goals achieved: Yes  Skilled Therapeutic Interventions/Progress Updates:  Patient received sitting in recliner and agreeable to therapy session. Pt ambulated >271f using RW mod-I during therapy session. Pt ascended/descended a total of 12 steps using reciprocal stepping pattern on ascent and descent with initial  BUE support on 2 handrails with close supervision progressed to no UE support with CGA for steadying/safety. Reinforced prior education regarding proper positioning of family to provide CGA/close supervision at home with pt able to recall without cuing. Reinforced prior education of safety in the event of a fall at home and when to call 911 with pt requiring min cuing to recall all education. Pt performed floor transfer with supervision and no cuing on proper technique. Pt educated on HEP  to be performed at home along with safe set-up for balance exercises. Pt performed static standing without UE support in narrow BOS for 30 seconds progressed to semitandem stance for 20 seconds prior to LOB requiring use of wall to self-correct - education on standing in a corner and use of UE support on RW for safety at home. Pt performed alternate LE taps on 4" step without UE support and same safety set-up as for static standing balance with emphasis placed on need for family to provide close supervision at home. Pt performed repeated sit<>stand from low surface mat table with visual demonstration, verbal cuing, and manual facilitation for proper technique with emphasis on posterior weight shifting into heels during descent 2 sets of 10. Performed supine bridging with cuing for core activation and increased height on lifting. Performed bird dog with alternating B LE extension with cuing for core activation to stabilize trunk - pt unable to progress to addition of simultaneous UE and LE alternation without LOB. Pt performed car transfer via stand pivot using RW with supervision and education on proper sequencing of task with pt demonstrating understanding. Pt ambulated ~252f no AD, to room with close supervision for safety. Pt left sitting in chair with needs in reach including RW.  PT Discharge Precautions/Restrictions Precautions Precautions: Fall Restrictions Weight Bearing Restrictions: No Pain Pain Assessment Pain Scale: 0-10 Pain Score: 0-No pain Vision/Perception  Perception Perception: Within Functional Limits Praxis Praxis: Intact  Cognition Overall Cognitive Status: Within Functional Limits for tasks assessed Arousal/Alertness: Awake/alert Orientation Level: Oriented X4 Attention: Focused;Sustained Focused Attention: Appears intact Sustained Attention: Appears intact Memory: Appears intact Awareness: Appears intact Problem Solving: Appears intact Safety/Judgment: Appears  intact Sensation Sensation Light Touch: Impaired Detail Peripheral sensation comments: hx of peripheral neuropathy with decreased  light touch on plantar surface of feet Light Touch Impaired Details: Impaired LLE;Impaired RLE Coordination Gross Motor Movements are Fluid and Coordinated: No Heel Shin Test: Symmetrical and WFL R LE and L LE Motor  Motor Motor: Ataxia;Abnormal postural alignment and control  Mobility Bed Mobility Bed Mobility: Rolling Left;Supine to Sit;Sit to Supine Rolling Left: Independent Supine to Sit: Independent Sit to Supine: Independent Transfers Transfers: Sit to Stand;Stand to [employer] Transfers Sit to Stand: Independent with assistive device Stand to Sit: Independent with assistive device Stand Pivot Transfers: Independent with assistive device Stand Pivot Transfer Details: Verbal cues for safe use of DME/AE;Verbal cues for technique;Verbal cues for sequencing Stand Pivot Transfer Details (indicate cue type and reason): requires supervision for stand pivot transfers without AD Transfer (Assistive device): Rolling walker Locomotion  Gait Ambulation: Yes Gait Assistance: Independent with assistive device Gait Distance (Feet): 250 Feet Assistive device: Rolling walker Gait Assistance Details: Verbal cues for precautions/safety;Verbal cues for sequencing;Verbal cues for technique Gait Gait: Yes Gait Pattern: Wide base of support;Ataxic Stairs / Additional Locomotion Stairs: Yes Stairs Assistance: Supervision/Verbal cueing Stair Management Technique: Two rails;Other (comment)(when ascending/descending without UE support requires CGA) Number of Stairs: 12 Height of Stairs: 6 Wheelchair Mobility Wheelchair Mobility: No  Trunk/Postural Assessment  Cervical Assessment Cervical Assessment: Within Functional Limits Thoracic Assessment Thoracic Assessment: Within Functional Limits Lumbar Assessment Lumbar Assessment: Exceptions to WFL(posterior  pelvic tilt) Postural Control Postural Control: Deficits on evaluation Trunk Control: L lateral weight shift during standing Righting Reactions: delayed  Balance Balance Balance Assessed: Yes Standardized Balance Assessment Standardized Balance Assessment: Berg Balance Test Berg Balance Test Sit to Stand: Able to stand without using hands and stabilize independently Standing Unsupported: Able to stand safely 2 minutes Sitting with Back Unsupported but Feet Supported on Floor or Stool: Able to sit safely and securely 2 minutes Stand to Sit: Sits safely with minimal use of hands Transfers: Able to transfer safely, minor use of hands Standing Unsupported with Eyes Closed: Able to stand 10 seconds safely Standing Ubsupported with Feet Together: Able to place feet together independently but unable to hold for 30 seconds From Standing, Reach Forward with Outstretched Arm: Can reach confidently >25 cm (10") From Standing Position, Pick up Object from Floor: Able to pick up shoe safely and easily From Standing Position, Turn to Look Behind Over each Shoulder: Turn sideways only but maintains balance Turn 360 Degrees: Able to turn 360 degrees safely in 4 seconds or less Standing Unsupported, Alternately Place Feet on Step/Stool: Able to stand independently and safely and complete 8 steps in 20 seconds Standing Unsupported, One Foot in Front: Loses balance while stepping or standing Standing on One Leg: Tries to lift leg/unable to hold 3 seconds but remains standing independently Total Score: 45 Static Sitting Balance Static Sitting - Level of Assistance: 7: Independent Dynamic Sitting Balance Dynamic Sitting - Level of Assistance: 6: Modified independent (Device/Increase time) Static Standing Balance Static Standing - Level of Assistance: 6: Modified independent (Device/Increase time) Dynamic Standing Balance Dynamic Standing - Level of Assistance: 6: Modified independent (Device/Increase  time)  Extremity Assessment      RLE Assessment RLE Assessment: Within Functional Limits(Grossly 4+/5 throughout) LLE Assessment LLE Assessment: Within Functional Limits LLE Strength Left Hip Flexion: 4+/5 Left Hip ABduction: 4+/5 Left Knee Flexion: 4+/5 Left Knee Extension: 4+/5 Left Ankle Dorsiflexion: 4+/5 Left Ankle Plantar Flexion: 4+/5    Tawana Scale, PT, DPT 08/26/2018, 1:34 PM

## 2018-08-26 NOTE — Progress Notes (Signed)
[place] PHYSICAL MEDICINE & REHABILITATION PROGRESS NOTE   Subjective/Complaints:  Pt anxious to go home  ROS- neg CP SOB, N/V/D   ROS: Patient denies CP, SOB, N/V/D Objective:   No results found. Recent Labs    08/25/18 0557  WBC 7.7  HGB 13.9  HCT 41.3  PLT 212   Recent Labs    08/25/18 0557  NA 138  K 3.8  CL 105  CO2 24  GLUCOSE 166*  BUN 14  CREATININE 1.43*  CALCIUM 9.1    Intake/Output Summary (Last 24 hours) at 08/26/2018 0857 Last data filed at 08/25/2018 1700 Gross per 24 hour  Intake 240 ml  Output -  Net 240 ml     Physical Exam: Vital Signs Blood pressure 122/79, pulse 66, temperature 98.2 F (36.8 C), temperature source Oral, resp. rate 18, height 5\' 8"  (1.727 m), weight 85.4 kg, SpO2 96 %.   Constitutional: No distress . Vital signs reviewed. HEENT: EOMI, oral membranes moist Neck: supple Cardiovascular: RRR without murmur. No JVD    Respiratory: CTA Bilaterally without wheezes or rales. Normal effort    GI: BS +, non-tender, non-distended  Extremities: No clubbing, cyanosis, or edema Skin: No evidence of breakdown, no evidence of rash Neurologic:   motor strength is 5/5 in bilateral deltoid, bicep, tricep, grip, hip flexor, knee extensors, ankle dorsiflexor and plantar flexor Sensory exam normal sensation to light touch  in bilateral upper and lower extremities  Left hemiataxia, dysmetria finger-nose-finger  Musculoskeletal: Full range of motion in all 4 extremities. No joint swelling Psyc: pleasant but sl anxious  Assessment/Plan: 1. Functional deficits secondary to Left medullary infarct with Left hemiataxia, Left  which require 3+ hours per day of interdisciplinary therapy in a comprehensive inpatient rehab setting.  Physiatrist is providing close team supervision and 24 hour management of active medical problems listed below.  Physiatrist and rehab team continue to assess barriers to discharge/monitor patient progress toward  functional and medical goals  Care Tool:  Bathing  Bathing activity did not occur: Safety/medical concerns Body parts bathed by patient: Right arm, Left arm, Chest, Abdomen, Front perineal area, Buttocks, Right upper leg, Left upper leg, Right lower leg, Left lower leg, Face         Bathing assist Assist Level: Supervision/Verbal cueing     Upper Body Dressing/Undressing Upper body dressing Upper body dressing/undressing activity did not occur (including orthotics): Safety/medical concerns What is the patient wearing?: Pull over shirt    Upper body assist Assist Level: Independent    Lower Body Dressing/Undressing Lower body dressing    Lower body dressing activity did not occur: Safety/medical concerns What is the patient wearing?: Pants, Underwear/pull up     Lower body assist Assist for lower body dressing: Supervision/Verbal cueing     Toileting Toileting    Toileting assist Assist for toileting: Contact Guard/Touching assist Assistive Device Comment: walker   Transfers Chair/bed transfer  Transfers assist     Chair/bed transfer assist level: Supervision/Verbal cueing     Locomotion Ambulation   Ambulation assist      Assist level: Contact Guard/Touching assist Assistive device: Other (comment)(no AD) Max distance: 244ft   Walk 10 feet activity   Assist     Assist level: Contact Guard/Touching assist Assistive device: Other (comment)(none)   Walk 50 feet activity   Assist    Assist level: Contact Guard/Touching assist Assistive device: Other (comment)(none)    Walk 150 feet activity   Assist Walk 150 feet activity did  not occur: Safety/medical concerns  Assist level: Contact Guard/Touching assist Assistive device: Other (comment)(none)    Walk 10 feet on uneven surface  activity   Assist Walk 10 feet on uneven surfaces activity did not occur: Safety/medical concerns         Wheelchair     Assist Will patient use  wheelchair at discharge?: No             Wheelchair 50 feet with 2 turns activity    Assist            Wheelchair 150 feet activity     Assist          Medical Problem List and Plan: 1.Functional and mobility deficitssecondary to leftposterolateralmedullary infarct -CIR PT, OT ,plan d/c 3/25        Pt has friend who is retired PT that offered to do therapy with him at home 2. Antithrombotics: -DVT/anticoagulation:Pharmaceutical:Lovenox -antiplatelet therapy: ASA/Plavix X 3 weeks followed by Plavix alone 3. Pain Management:no severe pain at this time. 4. Mood:LCSW to follow for evaluation and support. -antipsychotic agents: N/A 5. Neuropsych: This patientiscapable of making decisions on hisown behalf. 6. Skin/Wound Care:LCSW to follow for evaluation and support. 7. Fluids/Electrolytes/Nutrition:Monitor I/O. Check lytes in am.  8. HTN: Monitor BP bid- avoid hypotension. Lisinopril on hold.  Vitals:   08/25/18 1935 08/26/18 0534  BP: (!) 146/86 122/79  Pulse: 71 66  Resp: 16 18  Temp: 98 F (36.7 C) 98.2 F (36.8 C)  SpO2: 96% 96%  controlled orthostatic vitals normal as well 9. CKD: Baseline SCr 1.5-->stable at 1.43 10.T2DM-insulin dependent: Hgb A1c-9.6. At home on metformin 1000 mg bid--d/c due to CKD. And NPH 110 units with breakfast and 40 units with supper.   -Novolog meal coverage as well as SSI for tighter control.  CBG (last 3)  Recent Labs    08/25/18 1706 08/25/18 2136 08/26/18 0620  GLUCAP 193* 189* 75  borderline low CBG will reduce NPH to 40U tomorrow We will ask dietary to discuss food choices with the patient 11. Dyslipidemia: On Crestor.  12. Diabetic gastroparesis: Continue to hold Reglan/Carafate. No N/V during current hospitalization.monitor for recurrence 13. Leucocytosis: resolved afebrile                           14. Mild hypokalemia: improved  14.  Central vestibular  symptoms related to cerebellar infarct - cont stabilization exercise   LOS: 14 days A FACE TO FACE EVALUATION WAS PERFORMED  Erick Colace 08/26/2018, 8:57 AM

## 2018-08-26 NOTE — Plan of Care (Signed)
  Problem: RH SAFETY Goal: RH STG ADHERE TO SAFETY PRECAUTIONS W/ASSISTANCE/DEVICE Description STG Adhere to Safety Precautions With Mod Assistance  RW WCDevice.  Outcome: Progressing

## 2018-08-26 NOTE — Progress Notes (Signed)
Physical Therapy Session Note  Patient Details  Name: Jeff [name] MRN: [id number] Date of Birth: 31-Jan-1951  Today's Date: 08/26/2018 PT Individual Time: 1001-1114 and 1630-1700 PT Individual Time Calculation (min): 73 min and 30 min   Short Term Goals: Week 2:  PT Short Term Goal 1 (Week 2): = to LTG based on ELOS  Skilled Therapeutic Interventions/Progress Updates:    Session 1: Pt supine in bed upon PT arrival, agreeable to therapy tx and denies pain. Pt transferred to EOB Mod I and performed x1 VOR exercises horizontally and vertically while seated. Pt ambulated to the gym with supervision, no AD x 150 ft. Pt participated in berg balance test as detailed below, pt scored 45/56 and discussed these results with the pt. Therapist recommending use of walker at home independently and walking without AD only when daughter/wife are present. Pt worked on ambulation and dynamic balance to perform gait with changes in speeds, gait with sudden stops and backwards ambulation, supervision-CGA. Pt worked on static standing balance without UE support and narrow base of support including standing with feet together and standing tandem, CGA. Pt worked on increased weigthshifting over the R LE during all balance activities. Pt worked on community distance ambulation to ambulate x 500 ft this session without AD and supervision, occasional cues for obstacle avoidance on R side. Pt ambulated back to room and left in recliner with needs in reach, made Mod I in the room.   Session 2: Pt seated in recliner upon PT arrival, agreeable to therapy tx and denies pain. Pt has pictures of bathroom set up at home with grab bar, discussed with pt. Pt ambulated to the main entrance x 300 ft with RW and mod I working on community distance ambulation. Pt ambulated back to unit and worked on dynamic standing balance while on airex tossing ball against rebound trampoline, x 1 trial feet apart and x 1 trial feet together. Pt and  therapist discussed importance of exercise, educated pt on health promotion, active lifestyle and exercise options. Pt ambulated back to room and left sitting EOB with needs in reach.    Therapy Documentation Precautions:  Precautions Precautions: Fall Restrictions Weight Bearing Restrictions: No  Balance Balance Balance Assessed: Yes Standardized Balance Assessment Standardized Balance Assessment: Berg Balance Test Berg Balance Test Sit to Stand: Able to stand without using hands and stabilize independently Standing Unsupported: Able to stand safely 2 minutes Sitting with Back Unsupported but Feet Supported on Floor or Stool: Able to sit safely and securely 2 minutes Stand to Sit: Sits safely with minimal use of hands Transfers: Able to transfer safely, minor use of hands Standing Unsupported with Eyes Closed: Able to stand 10 seconds safely Standing Ubsupported with Feet Together: Able to place feet together independently but unable to hold for 30 seconds From Standing, Reach Forward with Outstretched Arm: Can reach confidently >25 cm (10") From Standing Position, Pick up Object from Floor: Able to pick up shoe safely and easily From Standing Position, Turn to Look Behind Over each Shoulder: Turn sideways only but maintains balance Turn 360 Degrees: Able to turn 360 degrees safely in 4 seconds or less Standing Unsupported, Alternately Place Feet on Step/Stool: Able to stand independently and safely and complete 8 steps in 20 seconds Standing Unsupported, One Foot in Front: Loses balance while stepping or standing Standing on One Leg: Tries to lift leg/unable to hold 3 seconds but remains standing independently Total Score: 45 Static Sitting Balance Static Sitting - Level of  Assistance: 6: Modified independent (Device/Increase time) Dynamic Sitting Balance Dynamic Sitting - Level of Assistance: 6: Modified independent (Device/Increase time)   Therapy/Group: Individual  Therapy  Cresenciano Genre, PT, DPT 08/26/2018, 7:55 AM

## 2018-08-26 NOTE — Progress Notes (Signed)
Occupational Therapy Session Note  Patient Details  Name: Jeff [name] MRN: [id number] Date of Birth: 14-Jun-1950  Today's Date: 08/26/2018 OT Individual Time: 7253-6644 OT Individual Time Calculation (min): 60 min    Short Term Goals: Week 2:  OT Short Term Goal 1 (Week 2): STG=LTG 2/2 ELOS  Skilled Therapeutic Interventions/Progress Updates:    Pt engaged in bathing/dressing tasks this morning at independent/independent with AD level this morning.  Pt amb in room without AD to gather clothing and supplies before entering shower.  Pt completed shower while standing with focus on balance and to increase endurance.  Recommended that pt sit on shower seat when he goes home.  Pt verbalized understanding of recommendation.  Pt continues to require rest breaks during session but states that he doesn't get tired as quickly as earlier in the week.  Discussed stroke risks with pt.  Educated pt on home safety recommendations.  Pt verbalized understanding of recommendations. Pt remained in recliner with all needs within reach and seat alarm activated.  Therapy Documentation Precautions:  Precautions Precautions: Fall Restrictions Weight Bearing Restrictions: No Pain:  Pt denies pain this morning   Therapy/Group: Individual Therapy  Rich Brave 08/26/2018, 9:32 AM

## 2018-08-27 ENCOUNTER — Other Ambulatory Visit: Payer: Self-pay | Admitting: Physical Medicine and Rehabilitation

## 2018-08-27 DIAGNOSIS — Z9189 Other specified personal risk factors, not elsewhere classified: Secondary | ICD-10-CM

## 2018-08-27 LAB — GLUCOSE, CAPILLARY: Glucose-Capillary: 94 mg/dL (ref 70–99)

## 2018-08-27 MED ORDER — PANTOPRAZOLE SODIUM 40 MG PO TBEC: 40.0000 mg | DELAYED_RELEASE_TABLET | Freq: Every day | ORAL | 1 refills | Status: DC

## 2018-08-27 MED ORDER — INSULIN ASPART 100 UNIT/ML ~~LOC~~ SOLN: 4.0000 [IU] | Freq: Two times a day (BID) | SUBCUTANEOUS | 11 refills | Status: DC

## 2018-08-27 MED ORDER — ROSUVASTATIN CALCIUM 40 MG PO TABS: 40.0000 mg | ORAL_TABLET | Freq: Every day | ORAL | 1 refills | Status: DC

## 2018-08-27 MED ORDER — CLOPIDOGREL BISULFATE 75 MG PO TABS: 75.0000 mg | ORAL_TABLET | Freq: Every day | ORAL | 1 refills | Status: DC

## 2018-08-27 MED ORDER — GLUCOSE BLOOD VI STRP: 1.0000 | ORAL_STRIP | Freq: Three times a day (TID) | 12 refills | Status: DC

## 2018-08-27 MED ORDER — INSULIN NPH (HUMAN) (ISOPHANE) 100 UNIT/ML ~~LOC~~ SUSP: SUBCUTANEOUS | 11 refills | Status: DC

## 2018-08-27 MED ORDER — ACETAMINOPHEN 325 MG PO TABS: 325.0000 mg | ORAL_TABLET | ORAL | Status: DC | PRN

## 2018-08-27 NOTE — Discharge Instructions (Signed)
Inpatient Rehab Discharge Instructions  LLIAM MARELLA Discharge date and time: 08/27/18   Activities/Precautions/ Functional Status: Activity: no lifting, driving, or strenuous exercise till cleared by MD.  Diet: cardiac diet and diabetic diet Wound Care: none needed    Functional status:  ___ No restrictions     ___ Walk up steps independently ___ 24/7 supervision/assistance   ___ Walk up steps with assistance _X__ Intermittent supervision/assistance  ___ Bathe/dress independently ___ Walk with walker     ___ Bathe/dress with assistance ___ Walk Independently    ___ Shower independently ___ Walk with assistance    _X__ Shower with supervision _X__ No alcohol     ___ Return to work/school ________   Special Instructions: 1. Monitor blood sugars before meals and at bedtime. 2. Use short acting Nolovog--insulin aspart with meals.    COMMUNITY REFERRALS UPON DISCHARGE:    Home Health:   PT & OT  Agency:[facility]   [phone]   Date of last service:08/27/2018  Medical Equipment/Items Ordered:[facility]-[id number]   GENERAL COMMUNITY RESOURCES FOR PATIENT/FAMILY: Support Groups:[facility] SUPPORT GROUP THE SECOND Thursday OF EACH MONTH @ 6:00-7:00 PM ON THE REHAB UNIT QUESTIONS CONTACT AMY [phone]  My questions have been answered and I understand these instructions. I will adhere to these goals and the provided educational materials after my discharge from the hospital.  Patient/Caregiver Signature _______________________________ Date __________  Clinician Signature _______________________________________ Date __________  Please bring this form and your medication list with you to all your follow-up doctor's appointments.

## 2018-08-27 NOTE — Progress Notes (Signed)
Patient was discharged from [facility] with all personal belongings. All questions were answered prior to discharge and patient left the building with his wife. [name], LPN

## 2018-08-27 NOTE — Discharge Summary (Signed)
Physician Discharge Summary  Patient ID: Jeff [name] MRN: [id number] DOB/AGE: 01/16/1951 68 y.o.  Admit date: 08/12/2018 Discharge date: 08/27/2018  Discharge Diagnoses:  Principal Problem:   Stroke due to embolism of left vertebral artery (HCC) Active Problems:   Hyperlipidemia   Diabetes mellitus (HCC)   CKD (chronic kidney disease)   Retinopathy of both eyes   Discharged Condition:  stable  Significant Diagnostic Studies: N/A   Labs:  Basic Metabolic Panel: BMP Latest Ref Rng & Units 08/25/2018 08/21/2018 08/18/2018  Glucose 70 - 99 mg/dL 701(X) 793(J) 030(S)  BUN 8 - 23 mg/dL 14 15 18   Creatinine 0.61 - 1.24 mg/dL 9.23(R) 0.07(M) 2.26(J)  BUN/Creat Ratio 10 - 24 - - -  Sodium 135 - 145 mmol/L 138 138 136  Potassium 3.5 - 5.1 mmol/L 3.8 4.5 4.1  Chloride 98 - 111 mmol/L 105 104 102  CO2 22 - 32 mmol/L 24 26 27   Calcium 8.9 - 10.3 mg/dL 9.1 9.4 9.5    CBC: CBC Latest Ref Rng & Units 08/25/2018 08/18/2018 08/13/2018  WBC 4.0 - 10.5 K/uL 7.7 7.2 7.8  Hemoglobin 13.0 - 17.0 g/dL 33.5 45.6 25.6  Hematocrit 39.0 - 52.0 % 41.3 42.7 41.1  Platelets 150 - 400 K/uL 212 211 236    CBG: Recent Labs  Lab 08/26/18 1013 08/26/18 1147 08/26/18 1704 08/26/18 2159 08/27/18 0622  GLUCAP 224* 165* 98 121* 94    Brief HPI:   Jeff [name] is a 68 year old RH male with history of T2DM, HTN, ADHD who was admitted on 08/08/18 with dizziness, difficulty walking and right facial numbness.  CTA head/neck revealed left V4 occlusion, superimposed moderate left VA origin stenosis due to soft plate, superimposed calcified ICA siphons with moderate left and moderate to severe right siphon stenosis as well as incidental findings of pseudoarthrosis of low ACDF with loosening of hardware C6/C7.  MRI brain done revealing new acute nonhemorrhagic punctate infarct left posterior lateral macula and stable atrophy with white matter disease.    2D echo showed EF of 60 to 65% with moderate thickening of AV and  no wall abnormality.  Dr. Roda Shutters felt the stroke likely due to left-VA dissection versus atherosclerosis.  Plavix was added with recommendations for DAPT x3 weeks followed by Plavix alone.  Patient with resultant dizziness, balance deficits with tendency to lean to the left as well as ataxia affecting ADLs and mobility.  CIR recommended due to functional decline   Hospital Course: Jeff [name] was admitted to rehab 08/12/2018 for inpatient therapies to consist of PT, ST and OT at least three hours five days a week. Past admission physiatrist, therapy team and rehab RN have worked together to provide customized collaborative inpatient rehab. He was maintained on ASA/Plavix during his stay and is tolerating this without SE. Follow up CBC showed that H/H and platelets are stable. Leucocytosis has resolved. He is to stop ASA after 3/30. Blood pressures have been monitored on bid basis. He had issues with orthostatic changes that have resolved by discharge. BP is stable without medications at this time. He did have recurrent issues with dizziness with increase in activity. Evaluation revealed central vestibular symptoms and he was educated on stabilization exercises. Marland Kitchen   He did report concerns due to visual changes with increase in floaters as well as left eye pain. His primary ophthalmology recommended getting evaluation by MD on call. Eye exam by Dr. Dione Booze revealed moderate NPDR OU and no ocular etiology for periocular  pain. VA and IOP WNL and no overt evidence of uveitis. He recommended using eye drops as decrease in sensation could be contributing to dry eyes. Neuropsychologist has followed for evaluation of mood and has worked with patient on coping and adjustment issues. His symptoms are stabilizing as expected and he was advised to follow up with Dr. Kieth Brightly if lability does not improves.   Serial check of lytes showed that SCr is stable at 1.43. Diabetes has been monitored on ac/hs basis and metformin was  discontinued due to CKD. NPH was adjusted to 50 units with breakfast and 40 units with supper to prevent hypoglycemic episodes. He has been instructed on CM diet and is to continue monitoring BS on ac/hs basis. He has not had any GI complaints during his stay and po intake has been good. Question symptoms due to SE due to Ozempic. His mood has been stable and he has made good gains during his stay. He is currently at supervision level and will continue to receive follow up HHPT and HHOT by [facility] due to pandemic. To transition to outpatient therapy once able.    Rehab course: During patient's stay in rehab weekly team conferences were held to monitor patient's progress, set goals and discuss barriers to discharge. At admission, patient required mod assist for mobility and min assist with self care tasks. He has had improvement in activity tolerance, balance, postural control as well as ability to compensate for deficits.  He is able to complete ADL tasks with supervision but requires multiple rest breaks due to endurance issues.  He requires supervision for transfers and is able to ambulate 250' with RW. Wife is able to provide care needed.     Disposition:  Home  Diet: Heart healthy/Carb Modified.   Special Instructions: 1. Monitor BS ac/hs basis. Follow up with primary MD for further adjustment in insulin.   Discharge Instructions    Amb Referral to Nutrition and Diabetic E   Complete by:  As directed    Ambulatory referral to Physical Medicine Rehab   Complete by:  As directed    1-2 weeks transitional care appt       Allergies as of 08/27/2018      Reactions   Flexeril [cyclobenzaprine Hcl] Other (See Comments)   Causes him to pass out and lowers his BP   Lidocaine Swelling   throat   Novocain [procaine Hcl] Swelling      Medication List    STOP taking these medications   lisinopril 10 MG tablet Commonly known as:  PRINIVIL,ZESTRIL   metFORMIN 1000 MG  tablet Commonly known as:  GLUCOPHAGE   metoCLOPramide 5 MG tablet Commonly known as:  REGLAN   omeprazole 40 MG capsule Commonly known as:  PRILOSEC   OZEMPIC (1 MG/DOSE) Shannon   rosuvastatin 20 MG tablet Commonly known as:  CRESTOR   sucralfate 1 g tablet Commonly known as:  Carafate     TAKE these medications   acetaminophen 325 MG tablet Commonly known as:  TYLENOL Take 1-2 tablets (325-650 mg total) by mouth every 4 (four) hours as needed for mild pain.   aspirin EC 81 MG tablet Take 1 tablet (81 mg total) by mouth daily. Notes to patient:  Stop taking aspirin after March 30th.    calcium carbonate 750 MG chewable tablet Commonly known as:  TUMS EX Chew 2 tablets by mouth as needed for heartburn.   glucose blood test strip Commonly known as:  Chartered loss adjuster  1 each by Other route 4 (four) times daily -  before meals and at bedtime. What changed:    when to take this  additional instructions   insulin aspart 100 UNIT/ML injection Commonly known as:  novoLOG Inject 4 Units into the skin 2 (two) times daily. With breakfast and lunch Notes to patient:  As long as blood sugar > 80 and you plan on eating at least 50% of your  Meal.    insulin NPH Human 100 UNIT/ML injection Commonly known as:  NovoLIN N Use 50 units with breakfast and 40 units after supper. What changed:    how much to take  how to take this  when to take this  additional instructions   Insulin Syringe-Needle U-100 31G X 5/16" 1 ML Misc Commonly known as:  B-D INS SYR ULTRAFINE 1CC/31G Used to inject insulin twice daily.      Follow-up Information    Kirsteins, Victorino Sparrow, MD Follow up.   Specialty:  Physical Medicine and Rehabilitation Why:  office will call you for follow up appointment Contact information: [address] [place] [address] [phone]        [facility] Follow up.   Specialty:  Neurology Why:  Call for follow up  appointment Contact information: [address] [place] [address] [phone]       Anson Fret, MD Follow up.   Specialty:  Family Medicine Contact information: [address] [place] [address] [phone]           Signed: KAYDYN MIZELLE 09/01/2018, 9:34 PM

## 2018-08-27 NOTE — Progress Notes (Signed)
Social Work  Discharge Note  The overall goal for the admission was met for:   Discharge location: Yes-HOME WITH WIFE WHO CAN PROVIDE SUPERVISION LEVEL  Length of Stay: Yes-15 DAYS  Discharge activity level: Yes-SUPERVISION-MOD/I LEVEL  Home/community participation: Yes  Services provided included: MD, RD, PT, OT, RN, CM, TR, Pharmacy, Neuropsych and SW  Financial Services: [employer]MED[employer]ARE  Follow-up services arranged: Home Health: [facility]-PT & OT, DME: [facility] and Patient/Family has no preference for HH/DME agencies  Comments (or additional information):PT DID WELL AND PROGRESSED WELL ONCE OP OPEN AGAIN WILL TRANSITION BUT WILL BE [place].   Patient/Family verbalized understanding of follow-up arrangements: Yes  Individual responsible for coordination of the follow-up plan: SELF & [name]-WIFE  Confirmed correct DME delivered: Elease Hashimoto 08/27/2018    Elease Hashimoto

## 2018-08-27 NOTE — Progress Notes (Signed)
[facility] PHYSICAL MEDICINE & REHABILITATION PROGRESS NOTE   Subjective/Complaints:    ROS- neg CP SOB, N/V/D   ROS: Patient denies CP, SOB, N/V/D Objective:   No results found. Recent Labs    08/25/18 0557  WBC 7.7  HGB 13.9  HCT 41.3  PLT 212   Recent Labs    08/25/18 0557  NA 138  K 3.8  CL 105  CO2 24  GLUCOSE 166*  BUN 14  CREATININE 1.43*  CALCIUM 9.1    Intake/Output Summary (Last 24 hours) at 08/27/2018 0836 Last data filed at 08/27/2018 0759 Gross per 24 hour  Intake 720 ml  Output -  Net 720 ml     Physical Exam: Vital Signs Blood pressure 108/68, pulse 60, temperature 98 F (36.7 C), temperature source Oral, resp. rate 15, height 5\' 8"  (1.727 m), weight 85.2 kg, SpO2 97 %.   Constitutional: No distress . Vital signs reviewed. HEENT: EOMI, oral membranes moist Neck: supple Cardiovascular: RRR without murmur. No JVD    Respiratory: CTA Bilaterally without wheezes or rales. Normal effort    GI: BS +, non-tender, non-distended  Extremities: No clubbing, cyanosis, or edema Skin: No evidence of breakdown, no evidence of rash Neurologic:   motor strength is 5/5 in bilateral deltoid, bicep, tricep, grip, hip flexor, knee extensors, ankle dorsiflexor and plantar flexor Sensory exam normal sensation to light touch  in bilateral upper and lower extremities  Left hemiataxia, dysmetria finger-nose-finger  Musculoskeletal: Full range of motion in all 4 extremities. No joint swelling Psyc: pleasant but sl anxious  Assessment/Plan: 1. Functional deficits secondary to Left medullary infarct with Left hemiataxia, Left  which require 3+ hours per day of interdisciplinary therapy in a comprehensive inpatient rehab setting.  Physiatrist is providing close team supervision and 24 hour management of active medical problems listed below.  Physiatrist and rehab team continue to assess barriers to discharge/monitor patient progress toward functional and medical  goals  Care Tool:  Bathing  Bathing activity did not occur: Safety/medical concerns Body parts bathed by patient: Right arm, Left arm, Chest, Abdomen, Front perineal area, Buttocks, Right upper leg, Left upper leg, Right lower leg, Left lower leg, Face         Bathing assist Assist Level: Independent with assistive device     Upper Body Dressing/Undressing Upper body dressing Upper body dressing/undressing activity did not occur (including orthotics): Safety/medical concerns What is the patient wearing?: Pull over shirt    Upper body assist Assist Level: Independent    Lower Body Dressing/Undressing Lower body dressing    Lower body dressing activity did not occur: Safety/medical concerns What is the patient wearing?: Pants, Underwear/pull up     Lower body assist Assist for lower body dressing: Independent with assitive device     Toileting Toileting    Toileting assist Assist for toileting: Independent Assistive Device Comment: walker   Transfers Chair/bed transfer  Transfers assist     Chair/bed transfer assist level: Independent     Locomotion Ambulation   Ambulation assist      Assist level: Independent with assistive device Assistive device: Walker-rolling Max distance: 262ft   Walk 10 feet activity   Assist     Assist level: Independent with assistive device Assistive device: Walker-rolling   Walk 50 feet activity   Assist    Assist level: Independent with assistive device Assistive device: Walker-rolling    Walk 150 feet activity   Assist Walk 150 feet activity did not occur: Safety/medical concerns  Assist level: Independent with assistive device Assistive device: Walker-rolling    Walk 10 feet on uneven surface  activity   Assist Walk 10 feet on uneven surfaces activity did not occur: Safety/medical concerns   Assist level: Supervision/Verbal cueing     Wheelchair     Assist Will patient use wheelchair at  discharge?: No             Wheelchair 50 feet with 2 turns activity    Assist            Wheelchair 150 feet activity     Assist          Medical Problem List and Plan: 1.Functional and mobility deficitssecondary to leftposterolateralmedullary infarct -stable for D/C 2. Antithrombotics: -DVT/anticoagulation:Pharmaceutical:Lovenox -antiplatelet therapy: ASA/Plavix X 3 weeks followed by Plavix alone 3. Pain Management:no severe pain at this time. 4. Mood:LCSW to follow for evaluation and support. -antipsychotic agents: N/A 5. Neuropsych: This patientiscapable of making decisions on hisown behalf. 6. Skin/Wound Care:LCSW to follow for evaluation and support. 7. Fluids/Electrolytes/Nutrition:Monitor I/O. Check lytes in am.  8. HTN: Monitor BP bid- avoid hypotension. Lisinopril on hold.  Vitals:   08/26/18 1942 08/27/18 0554  BP: (!) 147/84 108/68  Pulse: 84 60  Resp: 18 15  Temp: 98.3 F (36.8 C) 98 F (36.7 C)  SpO2: 100% 97%  controlled orthostatic vitals normal as well 9. CKD: Baseline SCr 1.5-->stable at 1.43 10.T2DM-insulin dependent: Hgb A1c-9.6. At home on metformin 1000 mg bid--d/c due to CKD. And NPH 110 units with breakfast and 40 units with supper.   -Novolog meal coverage as well as SSI for tighter control.  CBG (last 3)  Recent Labs    08/26/18 1704 08/26/18 2159 08/27/18 0622  GLUCAP 98 121* 94  borderline low CBG will reduce NPH to 40U tomorrow We will ask dietary to discuss food choices with the patient 11. Dyslipidemia: On Crestor.  12. Diabetic gastroparesis: Continue to hold Reglan/Carafate. No N/V during current hospitalization.monitor for recurrence 13. Leucocytosis: resolved afebrile                           14. Mild hypokalemia: improved  14.  Central vestibular symptoms related to cerebellar infarct - cont stabilization exercise   LOS: 15 days A FACE TO FACE EVALUATION  WAS PERFORMED  Erick Colace 08/27/2018, 8:36 AM

## 2018-08-28 ENCOUNTER — Ambulatory Visit: Payer: Medicare Other | Admitting: Occupational Therapy

## 2018-08-28 ENCOUNTER — Ambulatory Visit: Payer: Medicare Other | Admitting: Physical Therapy

## 2018-08-28 DIAGNOSIS — H3500 Unspecified background retinopathy: Secondary | ICD-10-CM

## 2018-08-28 DIAGNOSIS — N189 Chronic kidney disease, unspecified: Secondary | ICD-10-CM

## 2018-08-28 NOTE — Telephone Encounter (Signed)
FYI

## 2018-08-28 NOTE — Progress Notes (Signed)
Recreational Therapy Discharge Summary Patient Details  Name: Jeff [name] MRN: [id number] Date of Birth: Mar 22, 1951 Today's Date: 08/28/2018 Comments on progress toward goals: Pt has made great progress during LOS and discharged home with wife at supervision/mod I ambulatory level.  TR sessions focused on leisure awareness, leisure education including activity analysis with potential modifications.  Pt expressed interest in peer visitation in the future to support out program and the pts we serve.  Reasons for discharge: discharge from hospital Patient/family agrees with progress made and goals achieved: Yes  Roderica Cathell 08/28/2018, 8:32 AM

## 2018-08-29 ENCOUNTER — Telehealth: Payer: Self-pay | Admitting: *Deleted

## 2018-08-29 ENCOUNTER — Encounter: Payer: Medicare Other | Admitting: Registered Nurse

## 2018-08-29 ENCOUNTER — Other Ambulatory Visit: Payer: Self-pay

## 2018-08-29 NOTE — Telephone Encounter (Signed)
Orders approved and given per discharge summary. 

## 2018-08-29 NOTE — Telephone Encounter (Signed)
[facility] called for [facility] for 1wk1, 2 wk 4.  Approval given.

## 2018-08-29 NOTE — Telephone Encounter (Signed)
[facility] called for HHPT 2wk6.

## 2018-09-02 ENCOUNTER — Other Ambulatory Visit: Payer: Self-pay

## 2018-09-02 ENCOUNTER — Encounter: Payer: Self-pay | Admitting: Registered Nurse

## 2018-09-02 ENCOUNTER — Encounter: Payer: Medicare Other | Attending: Registered Nurse | Admitting: Registered Nurse

## 2018-09-02 DIAGNOSIS — E119 Type 2 diabetes mellitus without complications: Secondary | ICD-10-CM

## 2018-09-02 DIAGNOSIS — E7849 Other hyperlipidemia: Secondary | ICD-10-CM | POA: Diagnosis not present

## 2018-09-02 DIAGNOSIS — H3500 Unspecified background retinopathy: Secondary | ICD-10-CM | POA: Diagnosis not present

## 2018-09-02 DIAGNOSIS — I63112 Cerebral infarction due to embolism of left vertebral artery: Secondary | ICD-10-CM

## 2018-09-02 DIAGNOSIS — Z794 Long term (current) use of insulin: Secondary | ICD-10-CM

## 2018-09-02 NOTE — Progress Notes (Signed)
Virtual Transitional Care call Transitional Questions answered by Mrs. Bentler: Wife  Patient name: Jeff [name]  DOB: 06/03/1951 1. Are you/is patient experiencing any problems since coming home? No a. Are there any questions regarding any aspect of care? No 2. Are there any questions regarding medications administration/dosing? No a. Are meds being taken as prescribed? Yes b. "Patient should review meds with caller to confirm" Medication List Reviewed 3. Have there been any falls? No a. Has Home Health been to the house and/or have they contacted you? Yes, [facility] If not, have you tried to contact them? NA b. Can we help you contact them? NA 4. Are bowels and bladder emptying properly? Yes a. Are there any unexpected incontinence issues? No b. If applicable, is patient following bowel/bladder programs? NA 5. Any fevers, problems with breathing, unexpected pain? No 6. Are there any skin problems or new areas of breakdown? No 7. Has the patient/family member arranged specialty MD follow up (ie cardiology/neurology/renal/surgical/etc.)?  Yes, all HFU appointments have been scheduled, states Mrs. Perdomo. a. Can we help arrange? NA 8. Does the patient need any other services or support that we can help arrange? No 9. Are caregivers following through as expected in assisting the patient? Yes 10. Has the patient quit smoking, drinking alcohol, or using drugs as recommended? Mrs. Colucci states Mr. Crofts doesn't smoke, drink alcohol or use illicit drugs.   Appointment date/time 10/02/2018  arrival time 10:30 for 11:00 appointment with Dr. Wynn Banker. At   [address] [facility] [address]

## 2018-09-11 ENCOUNTER — Telehealth: Payer: Self-pay | Admitting: Endocrinology

## 2018-09-11 NOTE — Telephone Encounter (Signed)
Patient is requesting a call back to discuss his insulin and the changes that were made by the doctor he seen when he had his stroke Please advise

## 2018-09-15 ENCOUNTER — Telehealth: Payer: Self-pay

## 2018-09-15 NOTE — Telephone Encounter (Signed)
LVM requesting returned call 

## 2018-09-15 NOTE — Telephone Encounter (Signed)
error 

## 2018-09-15 NOTE — Telephone Encounter (Signed)
Sherilyn Dacosta, Manager from [facility] called requesting ST eval because is coughing after drinking thin liquids. Is this okay?

## 2018-09-15 NOTE — Telephone Encounter (Signed)
Yes please order Speech therapy swallow eval

## 2018-09-15 NOTE — Telephone Encounter (Signed)
Left Claris Che message of notification.

## 2018-09-19 ENCOUNTER — Telehealth: Payer: Self-pay | Admitting: *Deleted

## 2018-09-19 NOTE — Telephone Encounter (Signed)
[facility] ST called to get verbal order for ST 1wk1, 2wk3 for dysphagia.  Approval given.

## 2018-09-23 ENCOUNTER — Other Ambulatory Visit: Payer: Self-pay | Admitting: Physical Medicine and Rehabilitation

## 2018-09-23 DIAGNOSIS — Z9189 Other specified personal risk factors, not elsewhere classified: Secondary | ICD-10-CM

## 2018-10-02 ENCOUNTER — Encounter: Payer: Self-pay | Admitting: Physical Medicine & Rehabilitation

## 2018-10-02 ENCOUNTER — Encounter: Payer: Medicare Other | Attending: Physical Medicine & Rehabilitation

## 2018-10-02 ENCOUNTER — Other Ambulatory Visit: Payer: Self-pay

## 2018-10-02 ENCOUNTER — Ambulatory Visit (HOSPITAL_BASED_OUTPATIENT_CLINIC_OR_DEPARTMENT_OTHER): Payer: Medicare Other | Admitting: Physical Medicine & Rehabilitation

## 2018-10-02 VITALS — BP 105/72 | HR 70 | Resp 14 | Ht 68.0 in | Wt 193.0 lb

## 2018-10-02 DIAGNOSIS — I7774 Dissection of vertebral artery: Secondary | ICD-10-CM | POA: Diagnosis not present

## 2018-10-02 DIAGNOSIS — E11319 Type 2 diabetes mellitus with unspecified diabetic retinopathy without macular edema: Secondary | ICD-10-CM | POA: Insufficient documentation

## 2018-10-02 DIAGNOSIS — Z833 Family history of diabetes mellitus: Secondary | ICD-10-CM | POA: Diagnosis not present

## 2018-10-02 DIAGNOSIS — Z809 Family history of malignant neoplasm, unspecified: Secondary | ICD-10-CM | POA: Diagnosis not present

## 2018-10-02 DIAGNOSIS — G463 Brain stem stroke syndrome: Secondary | ICD-10-CM | POA: Diagnosis not present

## 2018-10-02 DIAGNOSIS — Z8249 Family history of ischemic heart disease and other diseases of the circulatory system: Secondary | ICD-10-CM | POA: Diagnosis not present

## 2018-10-02 DIAGNOSIS — I10 Essential (primary) hypertension: Secondary | ICD-10-CM | POA: Diagnosis not present

## 2018-10-02 NOTE — Progress Notes (Signed)
Subjective:    Patient ID: Jeff [name], male    DOB: Jan 26, 1951, 68 y.o.   MRN: [id number] 68 year old RH male with history of T2DM, HTN, ADHD who was admitted on 08/08/18 with dizziness, difficulty walking and right facial numbness.  CTA head/neck revealed left V4 occlusion, superimposed moderate left VA origin stenosis due to soft plate, superimposed calcified ICA siphons with moderate left and moderate to severe right siphon stenosis as well as incidental findings of pseudoarthrosis of low ACDF with loosening of hardware C6/C7.  MRI brain done revealing new acute nonhemorrhagic punctate infarct left posterior lateral macula and stable atrophy with white matter disease.     2D echo showed EF of 60 to 65% with moderate thickening of AV and no wall abnormality.  Dr. Roda ShuttersXu felt the stroke likely due to left-VA dissection versus atherosclerosis.  Plavix was added with recommendations for DAPT x3 weeks followed by Plavix alone.  Patient with resultant dizziness, balance deficits with tendency to lean to the left as well as ataxia affecting ADLs and mobility.  CIR recommended due to functional decline    HPI Patient has denied falls  Home health OT has discontinued The patient has followed up with his primary care physician regarding his diabetes.  He has not had any call back from his endocrinologist and is wanting to switch endocrinologist as well.  He [facility] with his [facility]. Ongoing home health SLP and PT  Mod I dressing and bathing, showering with a shower bench, patient wishes to shower while standing.  We discussed that physical therapy will need to okay this from a balance standpoint  Stopped ASA on Plavix  Swallowing better no respiratory infections  Pain Inventory Average Pain 0 Pain Right Now 0 My pain is no pain  In the last 24 hours, has pain interfered with the following? General activity 0 Relation with others 0 Enjoyment of  life 0 What TIME of day is your pain at its worst? no pain Sleep (in general) Fair  Pain is worse with: no pain Pain improves with: no pain Relief from Meds: no pain  Mobility walk with assistance use a cane use a walker ability to climb steps?  yes do you drive?  no  Function retired  Neuro/Psych weakness numbness trouble walking anxiety  Prior Studies hospital f/u  Physicians involved in your care hospital f/u   Family History  Problem Relation Age of Onset  . Heart disease Mother        CHF  . Diabetes Mother   . Heart disease Father   . Cancer Brother    Social History   Socioeconomic History  . Marital status: Single    Spouse name: Not on file  . Number of children: Not on file  . Years of education: Not on file  . Highest education level: Not on file  Occupational History  . Occupation: Archivistinancial Examiner Investigator  Social Needs  . Financial resource strain: Not on file  . Food insecurity:    Worry: Not on file    Inability: Not on file  . Transportation needs:    Medical: Not on file    Non-medical: Not on file  Tobacco Use  . Smoking status: Never Smoker  . Smokeless tobacco: Never Used  Substance and Sexual Activity  . Alcohol use: No    Alcohol/week: 0.0 standard drinks  . Drug use: No  . Sexual activity: Yes  Lifestyle  . Physical activity:    Days per week: Not on file    Minutes per session: Not on file  . Stress: Not on file  Relationships  . Social connections:    Talks on phone: Not on file    Gets together: Not on file    Attends religious service: Not on file    Active member of club or organization: Not on file    Attends meetings of clubs or organizations: Not on file    Relationship status: Not on file  Other Topics Concern  . Not on file  Social History Narrative  . Not on file   Past Surgical History:  Procedure Laterality Date  . NECK SURGERY    . SPINE SURGERY     Cervical spine x 2; Vear Clock; [employer].    Past Medical History:  Diagnosis Date  . ADHD (attention deficit hyperactivity disorder)   . Diabetes mellitus   . Hyperlipidemia   . Hypertension    BP 105/72   Pulse 70   Resp 14   Ht  (1.727 m)   Wt 193 lb (87.5 kg)   SpO2 95%   BMI 29.35 kg/m   Opioid Risk Score:   Fall Risk Score:  `1  Depression screen PHQ 2/9  Depression screen [facility] 2/9 10/02/2018 12/30/2017 10/23/2017 10/02/2017 09/30/2017 09/09/2017 08/19/2017  Decreased Interest 0 0 0 0 0 0 0  Down, Depressed, Hopeless 0 0 0 0 0 0 0  PHQ - 2 Score 0 0 0 0 0 0 0  Altered sleeping 3 - - - - - -  Tired, decreased energy 3 - - - - - -  Change in appetite 0 - - - - - -  Feeling bad or failure about yourself  0 - - - - - -  Trouble concentrating 0 - - - - - -  Moving slowly or fidgety/restless 0 - - - - - -  Suicidal thoughts 0 - - - - - -  PHQ-9 Score 6 - - - - - -    Review of Systems  Constitutional: Negative.   HENT: Negative.   Eyes: Negative.   Respiratory: Negative.   Cardiovascular: Negative.   Gastrointestinal: Negative.   Endocrine:       Uncontrolled blood sugar levels  Genitourinary: Negative.   Musculoskeletal: Positive for gait problem.  Skin: Negative.   Allergic/Immunologic: Negative.   Neurological: Positive for weakness and numbness.  Hematological: Bruises/bleeds easily.  Psychiatric/Behavioral: The patient is nervous/anxious.   All other systems reviewed and are negative.      Objective:   Physical Exam General no acute distress Mood and affect are appropriate Speech without evidence of dysarthria Extraocular motions are intact Sensation to pinprick is reduced left periorbital.  Intact in bilateral upper and lower limbs. Cerebellar: Minimal dysmetria left finger-nose-finger intact bilateral heel-to-shin intact right finger-nose-finger Motor strength is 5/5 bilateral deltoid bicep tricep grip hip flexor knee extensor ankle dorsiflexor.  Lungs are clear Heart regular rate and  rhythm no rubs murmurs or extra sounds Abdomen positive bowel sounds soft nontender palpation. Extremities no clubbing cyanosis or edema Skin is warm and dry  Gait is with standby assist wide base of support no toe drag or knee instability He is unable to stand with his feet together.    Assessment & Plan:  1.  Left lateral medullary infarct dissection vs stenosis Neuro rec Plavix only for prophyllaxis The patient has made good gains thus far.  I do agree with continuation of home health PT.  Speech should be able to stop soon, swallowing has improved  We discussed that the reduced sensation left side of face is related to his left medullary infarct.  We also discussed that this may improve with time and we should know whether this is going to be a permanent issue at the 57-month mark.  Physical medicine and rehabilitation follow-up in 4 weeks, we will assess whether outpatient therapy will be our next step.   2.  Diabetes with adjustment of meds as per Dr Yetta Barre  PCP follow up with  Anson Fret, MD Follow up.   Specialty:  Family Medicine Contact information: [address] [place] [address] [phone]  3.  Diabetic retinopathy f/u with Dr Ferol Luz patient had a driving restriction even prior to the stroke.

## 2018-10-02 NOTE — Patient Instructions (Signed)
Ask PT when you are ready for showering while standing

## 2018-10-09 ENCOUNTER — Ambulatory Visit: Payer: Medicare Other | Admitting: Endocrinology

## 2018-10-09 ENCOUNTER — Telehealth: Payer: Self-pay | Admitting: *Deleted

## 2018-10-09 NOTE — Telephone Encounter (Signed)
[facility], PT, [facility] left a message asking for verbal orders to extend HHPT.Medical record reviewed. Social work note reviewed.  Verbal orders given per office protocol.

## 2018-10-23 ENCOUNTER — Telehealth: Payer: Self-pay | Admitting: *Deleted

## 2018-10-23 NOTE — Telephone Encounter (Addendum)
[facility] PT called to extend PT 2wk4. Approval given.

## 2018-10-28 ENCOUNTER — Telehealth: Payer: Self-pay

## 2018-10-28 NOTE — Telephone Encounter (Signed)
Spoke with the patient and they have given verbal consent to file their insurance and to do a doxy.me visit. E-mail, mobile number and carrier have been confirmed and sent.  E-mail: [email or web address] Text: [phone] Development worker, international aid)

## 2018-10-29 ENCOUNTER — Telehealth: Payer: Self-pay

## 2018-10-29 NOTE — Telephone Encounter (Signed)
Called to reschedule 10/09/18 appt. LVM requesting returned call.

## 2018-10-30 ENCOUNTER — Encounter: Payer: Medicare Other | Attending: Physical Medicine & Rehabilitation | Admitting: Physical Medicine & Rehabilitation

## 2018-10-30 ENCOUNTER — Encounter: Payer: Self-pay | Admitting: Physical Medicine & Rehabilitation

## 2018-10-30 ENCOUNTER — Other Ambulatory Visit: Payer: Self-pay

## 2018-10-30 VITALS — BP 104/67 | HR 75 | Temp 98.4°F | Ht 68.0 in | Wt 196.0 lb

## 2018-10-30 DIAGNOSIS — R269 Unspecified abnormalities of gait and mobility: Secondary | ICD-10-CM | POA: Diagnosis not present

## 2018-10-30 DIAGNOSIS — H3500 Unspecified background retinopathy: Secondary | ICD-10-CM

## 2018-10-30 DIAGNOSIS — Z8249 Family history of ischemic heart disease and other diseases of the circulatory system: Secondary | ICD-10-CM | POA: Insufficient documentation

## 2018-10-30 DIAGNOSIS — Z833 Family history of diabetes mellitus: Secondary | ICD-10-CM | POA: Insufficient documentation

## 2018-10-30 DIAGNOSIS — I10 Essential (primary) hypertension: Secondary | ICD-10-CM | POA: Diagnosis not present

## 2018-10-30 DIAGNOSIS — G463 Brain stem stroke syndrome: Secondary | ICD-10-CM

## 2018-10-30 DIAGNOSIS — I69398 Other sequelae of cerebral infarction: Secondary | ICD-10-CM | POA: Diagnosis not present

## 2018-10-30 DIAGNOSIS — E11319 Type 2 diabetes mellitus with unspecified diabetic retinopathy without macular edema: Secondary | ICD-10-CM | POA: Diagnosis not present

## 2018-10-30 DIAGNOSIS — I7774 Dissection of vertebral artery: Secondary | ICD-10-CM | POA: Insufficient documentation

## 2018-10-30 DIAGNOSIS — Z809 Family history of malignant neoplasm, unspecified: Secondary | ICD-10-CM | POA: Insufficient documentation

## 2018-10-30 NOTE — Patient Instructions (Signed)
May need to transition to OP PT in 1 month

## 2018-10-30 NOTE — Progress Notes (Signed)
Subjective:    Patient ID: Jeff [name], male    DOB: 06-05-1950, 68 y.o.   MRN: [id number] 68 year old RH male with history of T2DM, HTN, ADHD who was admitted on 08/08/18 with dizziness, difficulty walking and right facial numbness.  CTA head/neck revealed left V4 occlusion, superimposed moderate left VA origin stenosis due to soft plate, superimposed calcified ICA siphons with moderate left and moderate to severe right siphon stenosis as well as incidental findings of pseudoarthrosis of low ACDF with loosening of hardware C6/C7.  MRI brain done revealing new acute nonhemorrhagic punctate infarct left posterior lateral macula and stable atrophy with white matter disease.     2D echo showed EF of 60 to 65% with moderate thickening of AV and no wall abnormality.  Dr. Roda ShuttersXu felt the stroke likely due to left-VA dissection versus atherosclerosis.  Plavix was added with recommendations for DAPT x3 weeks followed by Plavix alone.  Patient with resultant dizziness, balance deficits with tendency to lean to the left as well as ataxia affecting ADLs and mobility.  CIR recommended due to functional decline HPI Neck pains, hx ACDF x 2   Ankles swollen x 1 day. Has not walked that far since that time HHPT coming out, working on balance , uses cane No falls Seen by PCP trying sliding scale  No left facial pain, area of numbness decreasing   Pain Inventory Average Pain 5 Pain Right Now 5 My pain is constant and stabbing  In the last 24 hours, has pain interfered with the following? General activity 0 Relation with others 0 Enjoyment of life 2 What TIME of day is your pain at its worst? all Sleep (in general) Fair  Pain is worse with: bad weather outside Pain improves with: good weather outside Relief from Meds: na  Mobility use a cane how many minutes can you walk? 30 ability to climb steps?  yes do you drive?  no  Function retired  Neuro/Psych trouble walking  Prior Studies Any  changes since last visit?  no  Physicians involved in your care Any changes since last visit?  no   Family History  Problem Relation Age of Onset  . Heart disease Mother        CHF  . Diabetes Mother   . Heart disease Father   . Cancer Brother    Social History   Socioeconomic History  . Marital status: Single    Spouse name: Not on file  . Number of children: Not on file  . Years of education: Not on file  . Highest education level: Not on file  Occupational History  . Occupation: Archivistinancial Examiner Investigator  Social Needs  . Financial resource strain: Not on file  . Food insecurity:    Worry: Not on file    Inability: Not on file  . Transportation needs:    Medical: Not on file    Non-medical: Not on file  Tobacco Use  . Smoking status: Never Smoker  . Smokeless tobacco: Never Used  Substance and Sexual Activity  . Alcohol use: No    Alcohol/week: 0.0 standard drinks  . Drug use: No  . Sexual activity: Yes  Lifestyle  . Physical activity:    Days per week: Not on file    Minutes per session: Not on file  . Stress: Not on file  Relationships  . Social connections:    Talks on phone: Not on file    Gets together: Not on file  Attends religious service: Not on file    Active member of club or organization: Not on file    Attends meetings of clubs or organizations: Not on file    Relationship status: Not on file  Other Topics Concern  . Not on file  Social History Narrative  . Not on file   Past Surgical History:  Procedure Laterality Date  . NECK SURGERY    . SPINE SURGERY     Cervical spine x 2; [employer]ear [employer]; [employer].   Past Medical History:  Diagnosis Date  . ADHD (attention deficit hyperactivity disorder)   . Diabetes mellitus   . Hyperlipidemia   . Hypertension    BP 104/67   Pulse 75   Temp 98.4 F (36.9 C)   Ht 5\' 8"  (1.727 m)   Wt 196 lb (88.9 kg)   SpO2 95%   BMI 29.80 kg/m   Opioid Risk Score:   Fall Risk Score:  `1   Depression screen PHQ 2/9  Depression screen [facility] 2/9 10/30/2018 10/02/2018 12/30/2017 10/23/2017 10/02/2017 09/30/2017 09/09/2017  Decreased Interest 0 0 0 0 0 0 0  Down, Depressed, Hopeless 0 0 0 0 0 0 0  PHQ - 2 Score 0 0 0 0 0 0 0  Altered sleeping - 3 - - - - -  Tired, decreased energy - 3 - - - - -  Change in appetite - 0 - - - - -  Feeling bad or failure about yourself  - 0 - - - - -  Trouble concentrating - 0 - - - - -  Moving slowly or fidgety/restless - 0 - - - - -  Suicidal thoughts - 0 - - - - -  PHQ-9 Score - 6 - - - - -    Review of Systems  Constitutional: Negative.   HENT: Negative.   Eyes: Negative.   Respiratory: Negative.   Cardiovascular: Negative.   Gastrointestinal: Negative.   Endocrine: Negative.        High blood sugar  Genitourinary: Negative.   Musculoskeletal: Positive for neck pain.  Skin: Negative.   Allergic/Immunologic: Negative.   Neurological: Negative.   Hematological: Negative.   Psychiatric/Behavioral: Negative.   All other systems reviewed and are negative.      Objective:   Physical Exam Vitals signs and nursing note reviewed.  Constitutional:      Appearance: Normal appearance.  HENT:     Head: Normocephalic and atraumatic.     Nose: Nose normal.  Eyes:     Extraocular Movements: Extraocular movements intact.     Conjunctiva/sclera: Conjunctivae normal.     Pupils: Pupils are equal, round, and reactive to light.  Neck:     Comments: Cervical spine ROM 75% Neurological:     Mental Status: He is alert.    Gait is without assistive device he does have mild leaning  Mild left periocular numbness Romberg is negative Ambulates with a wide base of support no evidence of toe drag or knee instability 5/5 strength bilateral deltoid bicep tricep grip hip flexor knee extensor ankle dorsiflexion.     Assessment & Plan:  1.  Pt with continued left lateral pulsion, typical of Wallenberg syndrome no other significant residual symptoms  other than periocular numbness on the left side Patient overall progress to modified independent level. Still has some mild balance problems, will follow-up after therapy 4 weeks physical medicine rehabilitation clinic

## 2018-11-03 ENCOUNTER — Ambulatory Visit (INDEPENDENT_AMBULATORY_CARE_PROVIDER_SITE_OTHER): Payer: Medicare Other | Admitting: Adult Health

## 2018-11-03 ENCOUNTER — Other Ambulatory Visit: Payer: Self-pay

## 2018-11-03 ENCOUNTER — Encounter: Payer: Self-pay | Admitting: Adult Health

## 2018-11-03 VITALS — BP 100/62 | HR 66

## 2018-11-03 DIAGNOSIS — Z7902 Long term (current) use of antithrombotics/antiplatelets: Secondary | ICD-10-CM

## 2018-11-03 DIAGNOSIS — M960 Pseudarthrosis after fusion or arthrodesis: Secondary | ICD-10-CM

## 2018-11-03 DIAGNOSIS — E1143 Type 2 diabetes mellitus with diabetic autonomic (poly)neuropathy: Secondary | ICD-10-CM

## 2018-11-03 DIAGNOSIS — I10 Essential (primary) hypertension: Secondary | ICD-10-CM

## 2018-11-03 DIAGNOSIS — E1122 Type 2 diabetes mellitus with diabetic chronic kidney disease: Secondary | ICD-10-CM

## 2018-11-03 DIAGNOSIS — K279 Peptic ulcer, site unspecified, unspecified as acute or chronic, without hemorrhage or perforation: Secondary | ICD-10-CM

## 2018-11-03 DIAGNOSIS — F988 Other specified behavioral and emotional disorders with onset usually occurring in childhood and adolescence: Secondary | ICD-10-CM

## 2018-11-03 DIAGNOSIS — F909 Attention-deficit hyperactivity disorder, unspecified type: Secondary | ICD-10-CM

## 2018-11-03 DIAGNOSIS — N182 Chronic kidney disease, stage 2 (mild): Secondary | ICD-10-CM

## 2018-11-03 DIAGNOSIS — T84296D Other mechanical complication of internal fixation device of vertebrae, subsequent encounter: Secondary | ICD-10-CM

## 2018-11-03 DIAGNOSIS — Z794 Long term (current) use of insulin: Secondary | ICD-10-CM

## 2018-11-03 DIAGNOSIS — Z9181 History of falling: Secondary | ICD-10-CM

## 2018-11-03 DIAGNOSIS — I63532 Cerebral infarction due to unspecified occlusion or stenosis of left posterior cerebral artery: Secondary | ICD-10-CM

## 2018-11-03 DIAGNOSIS — I69393 Ataxia following cerebral infarction: Secondary | ICD-10-CM

## 2018-11-03 DIAGNOSIS — R269 Unspecified abnormalities of gait and mobility: Secondary | ICD-10-CM | POA: Diagnosis not present

## 2018-11-03 DIAGNOSIS — E785 Hyperlipidemia, unspecified: Secondary | ICD-10-CM

## 2018-11-03 DIAGNOSIS — K3184 Gastroparesis: Secondary | ICD-10-CM

## 2018-11-03 DIAGNOSIS — I2510 Atherosclerotic heart disease of native coronary artery without angina pectoris: Secondary | ICD-10-CM

## 2018-11-03 DIAGNOSIS — I129 Hypertensive chronic kidney disease with stage 1 through stage 4 chronic kidney disease, or unspecified chronic kidney disease: Secondary | ICD-10-CM

## 2018-11-03 DIAGNOSIS — E119 Type 2 diabetes mellitus without complications: Secondary | ICD-10-CM

## 2018-11-03 DIAGNOSIS — H9191 Unspecified hearing loss, right ear: Secondary | ICD-10-CM

## 2018-11-03 NOTE — Telephone Encounter (Signed)
Pt called in stating they havent received link , link was resent to    [phone] [email or web address]

## 2018-11-03 NOTE — Progress Notes (Signed)
[facility] [address] [place]. [place] [address] [phone]       VIRTUAL VISIT FOLLOW UP NOTE  [name] Jeff [name] [name] Date of Birth:  30-Jun-1950 Medical Record Number:  [id number]   Reason for Referral:  hospital stroke follow up    Virtual Visit via Video Note  I connected with Jeff [name] Jeff [name] [name] on 11/03/18 at  9:15 AM EDT by a video enabled telemedicine application located remotely in my own home and verified that I am speaking with the correct person using two identifiers who was located at their own home.   Visit scheduled by Maryelizabeth Kaufmann, [place]. She discussed the limitations of evaluation and Jeff [name] [name] by telemedicine and the availability of in person appointments. The patient expressed understanding and agreed to proceed.Please see telephone note for additional scheduling information and consent.    CHIEF COMPLAINT:  Chief Complaint  Patient presents with   Follow-up    hospital follow up     HPI: Jeff [name] Jeff [name] [name] was initially scheduled today for in office hospital follow-up regarding left cerebellar infarct due to left VA occlusion status post TPA but due to COVID-19 safety precautions, visit transition to telemedicine via [email or web address] with patients consent. History obtained from patient, wife and chart review. Reviewed all radiology images and labs personally.   Jeff [name] Jeff [name] [name] a 68 y.o.malewith history of HTN, ADHD, DM, HLDand currently being treated for an ulcerwho presented with dizziness, gait difficulties, right facial numbness, facial droop and word finding difficulties.  CT head negative for hemorrhage and CTA showed left vertebral V4 occlusion, heavily calcified ICA siphons with moderate left and moderate to severe right siphon stenosis.  NIHSS 4 therefore TPA administered without complication.  Initial MRI negative for acute infarct.  Repeat MRI head showed acute nonhemorrhagic punctate infarct involving the left posterolateral medulla secondary to left VA  dissection versus arthrosclerosis given risk factors and ICA siphon atherosclerosis.  2D echo unremarkable.  LDL 161 and A1c 9.6.  Initiated DAPT for 3 weeks and Plavix alone.  HTN stable.  Increase Crestor dosage from 20 mg to 40 mg.  Resumed home diabetic medication and met with diabetic nurse coordinator. No prior history of stroke.  Residual deficits of dizziness, balance deficits and ataxia therefore discharged to CIR for ongoing therapies.  Found to have central vestibular symptoms during CIR admission.  Discharged home on 08/27/2018.   Jeff [name] continues to receive home health PT due to ongoing balance/stability difficulties but overall improvement and uses a cane intermittently for assistance. Denies any recent falls. Jeff [name] continues to do exercises at home on his home including walking 4-5 miles per day 4 days weekly with his wife. Jeff [name] has been able to walk on uneven surfaces without difficulty.  Denies additional speech deficits and completed ST/OT. Completed 3 weeks DAPT and continues on Plavix alone without side effects of bleeding or bruising.  Continues on Crestor without side effects myalgias. Jeff [name] Jeff [name] [name] with recent adjustments on insulin regimen. Blood pressure checked during todays visit at 100/62. Denies new or worsening stroke/TIA symptoms.    ROS:   14 system review of systems performed and negative with exception of gait difficulties  PMH:  Past Medical History:  Diagnosis Date   ADHD (attention deficit hyperactivity disorder)    Diabetes mellitus    Hyperlipidemia    Hypertension     PSH:  Past Surgical History:  Procedure Laterality Date   NECK SURGERY  SPINE SURGERY     Cervical spine x 2; [facility]in Negus; [employer].    Social History:  Social History   Socioeconomic History   Marital status: Single    Spouse name: Not on file   Number of children: Not on file   Years of education: Not on file    Highest education level: Not on file  Occupational History   Occupation: Press photographer  Social Needs   Financial resource strain: Not on file   Food insecurity:    Worry: Not on file    Inability: Not on file   Transportation needs:    Medical: Not on file    Non-medical: Not on file  Tobacco Use   Smoking status: Never Smoker   Smokeless tobacco: Never Used  Substance and Sexual Activity   Alcohol use: No    Alcohol/week: 0.0 standard drinks   Drug use: No   Sexual activity: Yes  Lifestyle   Physical activity:    Days per week: Not on file    Minutes per session: Not on file   Stress: Not on file  Relationships   Social connections:    Talks on phone: Not on file    Gets together: Not on file    Attends religious service: Not on file    Active member of club or organization: Not on file    Attends meetings of clubs or organizations: Not on file    Relationship status: Not on file   Intimate partner violence:    Fear of current or ex partner: Not on file    Emotionally abused: Not on file    Physically abused: Not on file    Forced sexual activity: Not on file  Other Topics Concern   Not on file  Social History Narrative   Not on file    Family History:  Family History  Problem Relation Age of Onset   Heart disease Mother        CHF   Diabetes Mother    Heart disease Father    Cancer Brother     Medications:   Current Outpatient Medications on File Prior to Visit  Medication Sig Dispense Refill   acetaminophen (TYLENOL) 325 MG tablet Take 1-2 tablets (325-650 mg total) by mouth every 4 (four) hours as needed for mild pain.     calcium carbonate (TUMS EX) 750 MG chewable tablet Chew 2 tablets by mouth as needed for heartburn.     clopidogrel (PLAVIX) 75 MG tablet TAKE 1 TABLET BY MOUTH DAILY 90 tablet 0   glucose blood (ONETOUCH VERIO) test strip 1 each by Other route 4 (four) times daily -  before meals and at  bedtime. 100 each 12   insulin aspart (NOVOLOG) 100 UNIT/ML injection Inject 4 Units into the skin 2 (two) times daily. With breakfast and lunch (Patient taking differently: Inject 4 Units into the skin 2 (two) times daily. Sliding scale) 10 mL 11   insulin NPH Human (NOVOLIN N) 100 UNIT/ML injection Use 50 units with breakfast and 40 units after supper. (Patient taking differently: Use 30 units with breakfast and 30 units after supper.) 110 mL 11   Insulin Syringe-Needle U-100 (B-D INS SYR ULTRAFINE 1CC/31G) 31G X 5/16" 1 ML MISC Used to inject insulin twice daily. 90 each 2   lisinopril (ZESTRIL) 2.5 MG tablet Take 2.5 mg by mouth daily.     pantoprazole (PROTONIX) 40 MG tablet TAKE 1 TABLET BY MOUTH AT BEDTIME 90 tablet 0  rosuvastatin (CRESTOR) 40 MG tablet TAKE 1 TABLET BY MOUTH DAILY 90 tablet 0   No current facility-administered medications on file prior to visit.     Allergies:   Allergies  Allergen Reactions   Flexeril [Cyclobenzaprine Hcl] Other (See Comments)    Causes him to pass out and lowers his BP   Lidocaine Swelling    throat   Novocain [Procaine Hcl] Swelling     Physical Exam  Vitals:   11/03/18 0926  BP: 100/62  Pulse: 66   There is no height or weight on file to calculate BMI. No exam data present  Depression screen [facility] 2/9 11/03/2018  Decreased Interest 0  Down, Depressed, Hopeless 0  PHQ - 2 Score 0  Altered sleeping -  Tired, decreased energy -  Change in appetite -  Feeling bad or failure about yourself  -  Trouble concentrating -  Moving slowly or fidgety/restless -  Suicidal thoughts -  PHQ-9 Score -     General: well developed, well nourished, pleasant middle aged Caucasian male, seated, in no evident distress Head: head normocephalic and atraumatic.    Neurologic Exam Mental Status: Awake and fully alert. Oriented to place and time. Recent and remote memory intact. Attention span, concentration and fund of knowledge appropriate.  Mood and affect appropriate.  Cranial Nerves: Extraocular movements full without nystagmus. Hearing intact to voice. Facial sensation intact. Face, tongue, palate moves normally and symmetrically.  Shoulder shrug symmetric. Motor: No evidence of weakness per drift assessment Sensory.: intact to light touch Coordination: Rapid alternating movements normal in all extremities. Finger-to-nose and heel-to-shin performed accurately bilaterally. Gait and Station: Arises from chair without difficulty. Stance is normal. Gait demonstrates normal stride length and balance without use of assistive device. Unable to perform tandem gait.  Reflexes: UTA   NIHSS  0 Modified Rankin  2   Diagnostic Data (Labs, Imaging, Testing)  Ct Angio Head W Or Wo Contrast Ct Angio Neck W Or Wo Contrast 08/08/2018 IMPRESSION:  1. Positive forLeft Vertebral Artery V4 occlusion.The Right vertebral artery and basilar remain normal.  2. Superimposed moderate left vertebral artery origin stenosis appears related to soft plaque. 3. Heavily calcified ICA siphons with moderate Left and moderate to severe Right siphon stenosis.No carotid stenosis in the neck.  4. Moderate Left MCA M2 origin stenosis.  5. Lower cervical ACDF with pseudoarthrosis and hardware loosening at C6-C7.   Ct Head Code Stroke Wo Contrast 08/08/2018 IMPRESSION:  1. No acute finding  2. Atrophy and chronic small vessel ischemia that has progressed from 2016.   MRI Brain WO Contrast 08/10/2018 IMPRESSION: 1. No acute infarct. 2. Chronic small vessel ischemia and volume loss.   MRI Brain WO Contrast(Repeat) 08/09/2018 IMPRESSION: 1. 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 2. Otherwise stable atrophy and white matter disease.   Transthoracic Echocardiogram  08/08/2018 IMPRESSIONS 1. The left ventricle has normal systolic function with an ejection fraction of 60-65%. The cavity size was normal.  Left ventricular diastolic parameters were normal. 2. The right ventricle has normal systolic function. The cavity was normal. There is no increase in right ventricular wall thickness. 3. Left atrial size was mildly dilated. 4. The mitral valve is degenerative. Mild thickening of the mitral valve leaflet. 5. The tricuspid valve is normal in structure. 6. The aortic valve is tricuspid Moderate thickening of the aortic valve Moderate calcification of the aortic valve. 7. The pulmonic valve was normal in structure. 8. The interatrial septum was not  well visualized.    ASSESSMENT: ROEN MACGOWAN is a 68 y.o. year old male here with left cerebellum DWI negative infarct due to left ICA occlusion status post TPA on 08/09/2018 secondary to left VA occlusion versus arthrosclerosis given risk factors and ICA siphon atherosclerosis. Vascular risk factors include HTN, HLD and DM. Residual stroke deficits of balance/stability difficulties.     PLAN:  1. Left cerebellum infarct: Continue clopidogrel 75 mg daily  and Crestor 40 mg for secondary stroke prevention. Maintain strict control of hypertension with blood pressure goal below 130/90, diabetes with hemoglobin A1c goal below 6.5% and cholesterol with LDL cholesterol (bad cholesterol) goal below 70 mg/dL.  I also advised the patient to eat a healthy diet with plenty of whole grains, cereals, fruits and vegetables, exercise regularly with at least 30 minutes of continuous activity daily and maintain ideal body weight. 2. Gait difficulties: ongoing participation in home PT - will likely need to participate in outpatient PT once home PT completed 3. HTN: Advised to continue current treatment regimen.  Today's BP 106/68.  Advised to continue to monitor at home along with continued follow-up with PCP for Jeff [name] [name] 4. HLD: Advised to continue current treatment regimen along with continued follow-up with PCP for future prescribing and monitoring of lipid  panel 5. DMII: Advised to continue to monitor glucose levels at home along with continued follow-up with PCP/pharmacy for Jeff [name] [name] and monitoring    Follow up in 3 months or call earlier if needed   Greater than 50% of time during this 30 minute non face-to-face visit was spent on counseling, explanation of diagnosis of left cerebellum infarct, reviewing risk factor Jeff [name] [name] of HTN, HLD and DM, planning of further Jeff [name] [name] along with potential future Jeff [name] [name], and discussion with patient and family answering all questions.    Jeff [name] Jeff [name] [name], Jeff [name] Jeff [name] [name]  [facility] [address] [place], [place] [address]  Phone [phone] Fax [phone] Note: This document was prepared with digital dictation and possible smart phrase technology. Any transcriptional errors that result from this process are unintentional.

## 2018-11-04 NOTE — Progress Notes (Signed)
I agree with the above plan 

## 2018-11-07 ENCOUNTER — Other Ambulatory Visit: Payer: Self-pay

## 2018-11-07 NOTE — Patient Outreach (Signed)
Telephone outreach to patient to obtain mRs was successfully completed. mRs= 1. 

## 2018-11-11 NOTE — Telephone Encounter (Signed)
SAID HE WAS FEELING FINE DID NOT NEED AN APPOINTMENT AT THIS TIME

## 2018-11-27 ENCOUNTER — Encounter: Payer: Medicare Other | Attending: Physical Medicine & Rehabilitation | Admitting: Physical Medicine & Rehabilitation

## 2018-11-27 ENCOUNTER — Other Ambulatory Visit: Payer: Self-pay

## 2018-11-27 ENCOUNTER — Encounter: Payer: Self-pay | Admitting: Physical Medicine & Rehabilitation

## 2018-11-27 VITALS — BP 114/74 | HR 76 | Temp 97.5°F | Ht 68.0 in | Wt 196.0 lb

## 2018-11-27 DIAGNOSIS — E11319 Type 2 diabetes mellitus with unspecified diabetic retinopathy without macular edema: Secondary | ICD-10-CM | POA: Diagnosis not present

## 2018-11-27 DIAGNOSIS — Z8249 Family history of ischemic heart disease and other diseases of the circulatory system: Secondary | ICD-10-CM | POA: Diagnosis not present

## 2018-11-27 DIAGNOSIS — Z809 Family history of malignant neoplasm, unspecified: Secondary | ICD-10-CM | POA: Diagnosis not present

## 2018-11-27 DIAGNOSIS — R269 Unspecified abnormalities of gait and mobility: Secondary | ICD-10-CM | POA: Diagnosis not present

## 2018-11-27 DIAGNOSIS — G463 Brain stem stroke syndrome: Secondary | ICD-10-CM

## 2018-11-27 DIAGNOSIS — I69398 Other sequelae of cerebral infarction: Secondary | ICD-10-CM

## 2018-11-27 DIAGNOSIS — I7774 Dissection of vertebral artery: Secondary | ICD-10-CM | POA: Insufficient documentation

## 2018-11-27 DIAGNOSIS — I10 Essential (primary) hypertension: Secondary | ICD-10-CM | POA: Diagnosis not present

## 2018-11-27 DIAGNOSIS — Z833 Family history of diabetes mellitus: Secondary | ICD-10-CM | POA: Diagnosis not present

## 2018-11-27 NOTE — Progress Notes (Signed)
Subjective:    Patient ID: Jeff [name], male    DOB: 25-Oct-1950, 68 y.o.   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 General activity 0 Relation with others 0 Enjoyment of life 0 What TIME of day is your pain at its worst? all Sleep (in general) Good  Pain is worse with: unsure Pain improves with: heat/ice, therapy/exercise and rubbing Relief from Meds: 0  Mobility walk without assistance  Function retired  Neuro/Psych trouble walking  Prior Studies Any changes since last visit?  no  Physicians involved in your care Any changes since last visit?  no   Family History  Problem Relation Age of Onset  . Heart disease Mother        CHF  . Diabetes Mother   . Heart disease Father   . Cancer Brother    Social History   Socioeconomic History  . Marital status: Single    Spouse name: Not on file  . Number of children: Not on file  . Years of education: Not on file  . Highest education level: Not on file  Occupational History  . Occupation: Archivistinancial Examiner Investigator  Social Needs  . Financial resource strain: Not on file  . Food insecurity    Worry: Not on file    Inability: Not on file  . Transportation needs    Medical: Not on file    Non-medical: Not on file  Tobacco Use  . Smoking status: Never Smoker  . Smokeless tobacco: Never Used  Substance and Sexual Activity  . Alcohol use: No    Alcohol/week: 0.0 standard drinks  . Drug use: No  .  Sexual activity: Yes  Lifestyle  . Physical activity    Days per week: Not on file    Minutes per session: Not on file  . Stress: Not on file  Relationships  . Social Musicianconnections    Talks on phone: Not on file    Gets together: Not on file    Attends religious service: Not on file    Active member of club or organization: Not on file    Attends meetings of clubs or organizations: Not on file    Relationship status: Not on file  Other Topics Concern  . Not on file  Social History Narrative  . Not on file   Past Surgical History:  Procedure Laterality Date  . NECK SURGERY    . SPINE SURGERY     Cervical spine x 2; [employer]; [employer].   Past Medical History:  Diagnosis Date  . ADHD (attention deficit hyperactivity disorder)   . Diabetes mellitus   . Hyperlipidemia   . Hypertension    BP 114/74   Pulse 76   Temp (!) 97.5 F (36.4 C)   Ht 5\' 8"  (1.727 m)   Wt 196 lb (88.9 kg)   SpO2 96%   BMI 29.80 kg/m   Opioid Risk Score:   Fall Risk Score:  `1  Depression screen PHQ 2/9  Depression screen [facility] 2/9 11/03/2018 10/30/2018 10/02/2018 12/30/2017 10/23/2017 10/02/2017 09/30/2017  Decreased Interest 0 0 0 0 0 0 0  Down, Depressed, Hopeless 0 0 0 0 0 0 0  PHQ - 2 Score 0 0 0 0 0 0 0  Altered sleeping - - 3 - - - -  Tired, decreased energy - - 3 - - - -  Change in appetite - - 0 - - - -  Feeling bad or failure about yourself  - - 0 - - - -  Trouble concentrating - - 0 - - - -  Moving slowly or fidgety/restless - - 0 - - - -  Suicidal thoughts - - 0 - - - -  PHQ-9 Score - - 6 - - - -     Review of Systems  Constitutional: Negative.   HENT: Negative.   Eyes: Negative.   Respiratory: Negative.   Cardiovascular: Negative.   Gastrointestinal: Negative.   Endocrine: Negative.   Genitourinary: Negative.   Musculoskeletal: Positive for gait problem.  Skin: Negative.   Allergic/Immunologic: Negative.   Hematological: Negative.   Psychiatric/Behavioral: Negative.   All  other systems reviewed and are negative.      Objective:   Physical Exam Vitals signs and nursing note reviewed.  Constitutional:      Appearance: Normal appearance.  HENT:     Head: Normocephalic and atraumatic.  Eyes:     Extraocular Movements: Extraocular movements intact.     Conjunctiva/sclera: Conjunctivae normal.     Pupils: Pupils are equal, round, and reactive to light.  Skin:    General: Skin is warm and dry.  Neurological:     General: No focal deficit present.     Mental Status: He is alert and oriented to person, place, and time.     Comments: Motor strength is 5/5 bilateral deltoid bicep tricep grip hip flexor knee extensor ankle dorsiflexor Tone is normal bilateral upper and lower limbs No evidence of dysmetria on finger-nose-finger testing bilaterally Romberg is negative. Patient unable to perform tandem gait.  He is able to perform toe walk and heel walk with a wide-based support.  Psychiatric:        Mood and Affect: Mood normal.        Behavior: Behavior normal.           Assessment & Plan:  #1.  Left Wallenberg syndrome overall making good recovery still has residual gait disorder.  Will refer for outpatient PT. I will see him back in 6 weeks.

## 2019-01-12 ENCOUNTER — Other Ambulatory Visit: Payer: Self-pay

## 2019-01-12 ENCOUNTER — Encounter: Payer: Medicare Other | Attending: Physical Medicine & Rehabilitation | Admitting: Physical Medicine & Rehabilitation

## 2019-01-12 ENCOUNTER — Encounter: Payer: Self-pay | Admitting: Physical Medicine & Rehabilitation

## 2019-01-12 VITALS — BP 113/73 | HR 82 | Temp 98.8°F | Ht 68.0 in | Wt 201.0 lb

## 2019-01-12 DIAGNOSIS — I10 Essential (primary) hypertension: Secondary | ICD-10-CM | POA: Diagnosis not present

## 2019-01-12 DIAGNOSIS — R269 Unspecified abnormalities of gait and mobility: Secondary | ICD-10-CM | POA: Diagnosis not present

## 2019-01-12 DIAGNOSIS — Z809 Family history of malignant neoplasm, unspecified: Secondary | ICD-10-CM | POA: Diagnosis not present

## 2019-01-12 DIAGNOSIS — I7774 Dissection of vertebral artery: Secondary | ICD-10-CM | POA: Diagnosis present

## 2019-01-12 DIAGNOSIS — I69398 Other sequelae of cerebral infarction: Secondary | ICD-10-CM

## 2019-01-12 DIAGNOSIS — Z8249 Family history of ischemic heart disease and other diseases of the circulatory system: Secondary | ICD-10-CM | POA: Diagnosis not present

## 2019-01-12 DIAGNOSIS — Z833 Family history of diabetes mellitus: Secondary | ICD-10-CM | POA: Diagnosis not present

## 2019-01-12 DIAGNOSIS — E11319 Type 2 diabetes mellitus with unspecified diabetic retinopathy without macular edema: Secondary | ICD-10-CM | POA: Insufficient documentation

## 2019-01-12 DIAGNOSIS — G463 Brain stem stroke syndrome: Secondary | ICD-10-CM | POA: Diagnosis not present

## 2019-01-12 NOTE — Progress Notes (Signed)
Subjective:    Patient ID: Jeff [name], male    DOB: 1950-08-08, 68 y.o.   MRN: [id number]  HPI  68 year old male with history of left Wallenberg syndrome onset 08/08/2018 due to vertebral artery infarct versus dissection.  Has finished up with home health PT OT and speech mainly feels like his residual problems are related to balance. [facility] Lucianne MussKumar, has f/u 01/29/2019 Leg swelling but has increased Na intake, no new meds Problems with walking in the sand or grass  No heat or cold insensitivity Still falls to Right side Pain Inventory Average Pain 0 Pain Right Now 0 My pain is na  In the last 24 hours, has pain interfered with the following? General activity 0 Relation with others 0 Enjoyment of life 0 What TIME of day is your pain at its worst? na Sleep (in general) Fair  Pain is worse with: na Pain improves with: na Relief from Meds: na  Mobility walk without assistance do you drive?  no  Function retired  Neuro/Psych trouble walking  Prior Studies Any changes since last visit?  no  Physicians involved in your care Any changes since last visit?  no   Family History  Problem Relation Age of Onset  . Heart disease Mother        CHF  . Diabetes Mother   . Heart disease Father   . Cancer Brother    Social History   Socioeconomic History  . Marital status: Single    Spouse name: Not on file  . Number of children: Not on file  . Years of education: Not on file  . Highest education level: Not on file  Occupational History  . Occupation: Archivistinancial Examiner Investigator  Social Needs  . Financial resource strain: Not on file  . Food insecurity    Worry: Not on file    Inability: Not on file  . Transportation needs    Medical: Not on file    Non-medical: Not on file  Tobacco Use  . Smoking status: Never Smoker  . Smokeless tobacco: Never Used  Substance and Sexual Activity  . Alcohol use: No    Alcohol/week: 0.0 standard drinks  . Drug  use: No  . Sexual activity: Yes  Lifestyle  . Physical activity    Days per week: Not on file    Minutes per session: Not on file  . Stress: Not on file  Relationships  . Social Musicianconnections    Talks on phone: Not on file    Gets together: Not on file    Attends religious service: Not on file    Active member of club or organization: Not on file    Attends meetings of clubs or organizations: Not on file    Relationship status: Not on file  Other Topics Concern  . Not on file  Social History Narrative  . Not on file   Past Surgical History:  Procedure Laterality Date  . NECK SURGERY    . SPINE SURGERY     Cervical spine x 2; [employer]; [employer].   Past Medical History:  Diagnosis Date  . ADHD (attention deficit hyperactivity disorder)   . Diabetes mellitus   . Hyperlipidemia   . Hypertension    BP 113/73   Pulse 82   Temp 98.8 F (37.1 C)   Ht 5\' 8"  (1.727 m)   Wt 201 lb (91.2 kg)   SpO2 97%   BMI 30.56 kg/m   Opioid Risk  Score:   Fall Risk Score:  `1  Depression screen PHQ 2/9  Depression screen [facility] 2/9 11/03/2018 10/30/2018 10/02/2018 12/30/2017 10/23/2017 10/02/2017 09/30/2017  Decreased Interest 0 0 0 0 0 0 0  Down, Depressed, Hopeless 0 0 0 0 0 0 0  PHQ - 2 Score 0 0 0 0 0 0 0  Altered sleeping - - 3 - - - -  Tired, decreased energy - - 3 - - - -  Change in appetite - - 0 - - - -  Feeling bad or failure about yourself  - - 0 - - - -  Trouble concentrating - - 0 - - - -  Moving slowly or fidgety/restless - - 0 - - - -  Suicidal thoughts - - 0 - - - -  PHQ-9 Score - - 6 - - - -     Review of Systems  Constitutional: Negative.   HENT: Negative.   Eyes: Negative.   Respiratory: Negative.   Cardiovascular: Positive for leg swelling.  Gastrointestinal: Negative.   Endocrine: Negative.   Genitourinary: Negative.   Musculoskeletal: Negative.   Skin: Negative.   Allergic/Immunologic: Negative.   Neurological: Negative.   Hematological: Negative.    Psychiatric/Behavioral: Negative.   All other systems reviewed and are negative.      Objective:   Physical Exam Constitutional:      Appearance: Normal appearance.  HENT:     Head: Normocephalic and atraumatic.  Eyes:     Extraocular Movements: Extraocular movements intact.     Pupils: Pupils are equal, round, and reactive to light.  Skin:    General: Skin is warm and dry.  Neurological:     General: No focal deficit present.     Mental Status: He is alert and oriented to person, place, and time.  Psychiatric:        Mood and Affect: Mood normal.        Behavior: Behavior normal.     Motor strength is 5/5 bilateral deltoid bicep tricep grip hip flexor knee extension ankle dorsiflexion plantarflexion Positive Romberg falls toward the left and backwards. Has difficulty with tandem gait and can only take 1 or 2 steps before needing to step sideways to maintain balance. Sensation intact bilateral upper and lower limbs There is no evidence of facial droop There is no evidence of vocal hoarseness       Assessment & Plan:  1.  Left Wallenberg syndrome overall improved however has some higher-level balance problems.  I do think some outpatient therapy to work on ambulation, uneven surfaces as well as endurance would be helpful.  Will make referral.  Physical medicine and rehabilitation follow-up 6 weeks

## 2019-01-12 NOTE — Patient Instructions (Signed)
Please call if you haven't heard from Outpt PT next week

## 2019-01-15 ENCOUNTER — Ambulatory Visit: Payer: Medicare Other | Attending: Physical Medicine & Rehabilitation | Admitting: Physical Therapy

## 2019-01-15 ENCOUNTER — Other Ambulatory Visit: Payer: Self-pay

## 2019-01-15 DIAGNOSIS — R2681 Unsteadiness on feet: Secondary | ICD-10-CM | POA: Diagnosis present

## 2019-01-15 DIAGNOSIS — I69351 Hemiplegia and hemiparesis following cerebral infarction affecting right dominant side: Secondary | ICD-10-CM | POA: Diagnosis not present

## 2019-01-15 DIAGNOSIS — R293 Abnormal posture: Secondary | ICD-10-CM | POA: Diagnosis present

## 2019-01-15 DIAGNOSIS — R2689 Other abnormalities of gait and mobility: Secondary | ICD-10-CM | POA: Insufficient documentation

## 2019-01-15 NOTE — Patient Instructions (Signed)
Tandem Stance - PARTIAL HEEL TO TOE    Right foot in front of left, heel touching toe both feet "straight ahead". Stand on Foot Triangle of Support with both feet. Balance in this position _20-30__ seconds. Do with left foot in front of right.    SINGLE LIMB STANCE    Stance: single leg on floor. Raise leg. Hold _10__ seconds. Repeat with other leg. _1-2 reps, 2 times/day.     Heel Raise: Unilateral (Standing) -DO STANDING/HOLDING AT COUNTER    Balance on left foot, then rise on ball of foot. Repeat _15___ times per set. Do _1-2___ sets per session. Do __2__ sessions per day.  [email or web address]

## 2019-01-16 NOTE — Therapy (Signed)
[facility] [name] [place], [place], [address] Phone: [phone]   Fax:  [phone]  Physical Therapy Evaluation  Patient Details  Name: Jeff [name] Fiero MRN: [id number] Date of Birth: [birth year] Referring Provider (PT): Dr. Claudette LawsAndrew Kirsteins   Encounter Date: 01/15/2019  PT End of Session - 01/16/19 1536    Visit Number  1    Number of Visits  17    Date for PT Re-Evaluation  03/16/19    Authorization Type  [employer] Medicare    Authorization Time Period  01-15-19 - 04-15-19    PT Start Time  1402    PT Stop Time  1453    PT Time Calculation (min)  51 min    Activity Tolerance  Patient tolerated treatment well    Behavior During Therapy  [facility] for tasks assessed/performed       Past Medical History:  Diagnosis Date  . ADHD (attention deficit hyperactivity disorder)   . Diabetes mellitus   . Hyperlipidemia   . Hypertension     Past Surgical History:  Procedure Laterality Date  . NECK SURGERY    . SPINE SURGERY     Cervical spine x 2; [employer]; [employer].    There were no vitals filed for this visit.   Subjective Assessment - 01/15/19 1409    Subjective  [facility] with c/o dizziness, Rt sided facial numbness and difficulty walking due to CVA due to embolism of Lt vertebral artery.  Pt was admitted and was transferred to inpatient rehab on 08-12-18 and then discharged home on 08-27-18 with home health therapies. Pt received home health PT for approx. 12 weeks (til late June 2020); pt states he used RW for about a week and used a cane for about 2 weeks and then progressed himself off of cane to no device;    Patient is accompained by:  Family member   wife Darl PikesSusan (waiting in lobby)   Pertinent History  Type 2 DM with diabetic retinopathy (did not drive prior to CVA); stage 3 kidney disease; h/o cervical fusion C5-7 (approx. 2012) and h/o neck injury in Dec. 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  No/denies         [facility] - 01/16/19 0001      Assessment   Medical Diagnosis  Lt CVA    Referring Provider (PT)  Dr. Claudette LawsAndrew Kirsteins    Onset Date/Surgical Date  08/08/18    Hand Dominance  Right      Balance Screen   Has the patient fallen in the past 6 months  No    Has the patient had a decrease in activity level because of a fear of falling?   Yes    Is the patient reluctant to leave their home because of a fear of falling?   No      Home Environment   Living Environment  Private residence    Type of Home  House    Home Access  Stairs to enter    Entrance Stairs-Number  of Steps  2    Entrance Stairs-Rails  None    Home Layout  One level    Additional Comments  handicapped ramp in back if needed       Prior Function   Level of Independence  Independent;Other (comment)   does not drive due to retinopathy bil. 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       ROM / Strength   AROM / PROM / Strength  Strength;AROM      AROM   Overall AROM   Within functional limits for tasks performed      Strength   Overall Strength  Within functional limits for tasks performed;Other (comment)   except for Rt plantarflexors - 4-/5 in strength   Overall Strength Comments  Rt plantarflexors are weak - impact gait pattern and balance    Strength Assessment Site  Hip;Knee;Ankle     Right/Left Hip  Right      Transfers   Transfers  Sit to Stand    Number of Reps  Other reps (comment)   3   Comments  difficulty getting up from mat table in treatment room without use of UE's - has very wide BOS upon sit to stand      Ambulation/Gait   Ambulation/Gait  Yes    Ambulation/Gait Assistance  6: Modified independent (Device/Increase time)    Ambulation Distance (Feet)  150 Feet    Assistive device  None    Gait Pattern  Ataxic;Lateral trunk lean to left;Wide base of support;Trunk flexed    Ambulation Surface  Level;Indoor    Gait velocity  13.47 secs = 2.44 ft/sec with no device    Gait Comments  pt has difficulty amb. with head turns (> unsteadiness with vertical head turns than with horizontal head turns)      Standardized Balance Assessment   Standardized Balance Assessment  Berg Balance Test;Timed Up and Go Test      Berg Balance Test   Sit to Stand  Able to stand  independently using hands    Standing Unsupported  Able to stand safely 2 minutes    Sitting with Back Unsupported but Feet Supported on Floor or Stool  Able to sit safely and securely 2 minutes    Stand to Sit  Sits safely with minimal use of hands    Transfers  Able to transfer safely, minor use of hands    Standing Unsupported with Eyes Closed  Able to stand 10 seconds with supervision    Standing Unsupported with Feet Together  Able to place feet together independently and stand for 1 minute with supervision   feet approx. 3" apart   From Standing, Reach Forward with Outstretched Arm  Can reach confidently >25 cm (10")    From Standing Position, Pick up Object from Floor  Able to pick up shoe, needs supervision    From Standing Position, Turn to Look Behind Over each Shoulder  Turn sideways only but maintains balance    Turn 360 Degrees  Able to turn 360 degrees safely in 4 seconds or less    Standing Unsupported, Alternately Place Feet on Step/Stool  Able to complete >2 steps/needs minimal assist  Standing Unsupported, One Foot in Front  Able to take small step independently and hold 30 seconds    Standing on One Leg  Able to lift leg independently and hold > 10 seconds    Total Score  45      Timed Up and Go Test   Normal TUG (seconds)  14.07                Objective measurements completed on examination: See above findings.     Self Care: Pt instructed in HEP - see pt education details below        PT Education - 01/16/19 1535    Education Details  HEP initiated - SLS, partial heel to toe and RLE unilateral heel raises    Person(s) Educated  Patient    Methods  Explanation;Demonstration;Handout    Comprehension  Verbalized understanding;Returned demonstration       PT Short Term Goals - 01/16/19 1550      PT SHORT TERM GOAL #1   Title  Improve Berg score from 45/56 to >/= 49/56 for reduced fall risk.    Baseline  45/56 on 01-15-19    Time  4    Period  Weeks    Status  New    Target Date  02/15/19      PT SHORT TERM GOAL #2   Title  Improve TUG score from 14.07 to </= 12.5 secs with no device for decr. fall risk.    Baseline  14.07 secs on 01-15-19    Time  4    Period  Weeks    Status  New    Target Date  02/15/19      PT SHORT TERM GOAL #3   Title  Incr. gait velocity from 2.44 ft/sec to >/= 2.8 ft/sec for incr. gait efficiency, with pt demonstrating minimal Rt trunk lean.    Baseline  13.47 secs = 2.44 ft/sec without device    Time  4    Period  Weeks    Status  New    Target Date  02/15/19      PT SHORT TERM GOAL #4   Title  Incr. FGA score by at least 5 points from baseline to demonstrate improved balance with ambulation.    Baseline  to be completed next session    Time  4    Period  Weeks    Status  New    Target Date  02/15/19      PT SHORT TERM GOAL #5   Title  Independent in HEP for balance & strengthening exs.    Time  4    Period  Weeks    Status  New    Target Date  02/15/19        PT Long Term Goals - 01/16/19  1555      PT LONG TERM GOAL #1   Title  Increase Berg score to >/= 52/56 for reduced fall risk.    Baseline  4/56 on 01-15-19    Time  8    Period  Weeks    Status  New    Target Date  03/17/19      PT LONG TERM GOAL #2   Title  Improve TUG score to </= 10 secs without device to demonstrate high mobility.    Baseline  14.07 secs without device    Time  8    Period  Weeks    Status  New    Target  Date  03/17/19      PT LONG TERM GOAL #3   Title  Improve FGA score by at least 8 points to demonstrate improved balance with gait and for reduced fall risk.    Time  8    Period  Weeks    Status  New    Target Date  03/17/19      PT LONG TERM GOAL #4   Title  Increase gait velocity to >/= 3.2 ft/sec for incr. gait efficiency with minimal postural deviations exhibited.    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    Time  8    Period  Weeks    Status  New    Target Date  03/17/19      Additional Long Term Goals   Additional Long Term Goals  Yes      PT LONG TERM GOAL #6   Title  Pt will be independent in updated HEP as appropriate, including aquatic exercises.    Time  8    Period  Weeks    Status  New    Target Date  03/17/19             Plan - 01/16/19 1537    Clinical Impression Statement  Pt is a 68 yr old gentleman who presents with gait and postural deviations, decreased high level balance skills and Rt plantarflexor weakness due to CVA due to embolism Lt vertebral artery on 08-08-18.  [facility] and transferred to inpatient rehab on 08-12-18 with D/C home on 08-27-18 with home health therapies.  Pt has progressed from amb. with use of RW to [facility] to  currently no device used but pt demonstrates moderate postural abnormalities with mild ataxic gait pattern.  Pt is at slight fall risk per TUG score of 14.07 secs and Berg score 45/56.  Pt will benefit from skilled PT to address RLE weakness, gait and balance deficits and decreased endurance.    Personal Factors and Comorbidities  Comorbidity 2;Transportation    Comorbidities  h/o cervical fusion (C5-7) approx. 2012 with neck injury in Dec. 2016 (pt slipped off edge of bed):  CKD stage 3;  diabetic retinopathy bil. 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   Examination-Participation Restrictions  Church;Community Activity;Yard Work;Shop    Stability/Clinical Decision Making  Evolving/Moderate complexity    Clinical Decision Making  Moderate    Rehab Potential  Good    PT Frequency  2x / week    PT Duration  8 weeks    PT Treatment/Interventions  ADLs/Self Care Home Management;Aquatic Therapy;Gait training;Stair training;Functional mobility training;Therapeutic activities;Therapeutic exercise;Balance training;Neuromuscular re-education;Patient/family education    PT Next Visit Plan  please do FGA (I will update STG and LTG as appropriate);  check the 3 exercises given for HEP; cont high level balance and gait    PT Home Exercise Plan  SLS,  tandem and Rt heel raise issued on 01-15-19    Recommended Other Services  Aquatic therapy  - pt agrees    Consulted and Agree with Plan of Care  Patient;Family member/caregiver    Family Member Consulted  wife Darl PikesSusan       Patient will benefit from skilled therapeutic intervention in order to improve the following deficits and impairments:  Abnormal gait, Decreased activity tolerance, Decreased balance, Decreased strength, Decreased endurance, Impaired vision/preception, Decreased  coordination  Visit Diagnosis: 1. Hemiplegia and hemiparesis following cerebral infarction affecting right dominant side (HCC)   2. Other abnormalities of gait and mobility   3. Unsteadiness on feet   4. Abnormal posture        Problem List Patient Active Problem List   Diagnosis Date Noted  . CKD (chronic kidney disease) 08/28/2018  . Retinopathy of both eyes 08/28/2018  . 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 Stroke (HCC) 08/08/2018  . Diabetes mellitus (HCC) 07/25/2015  . Neck injury 06/22/2015  . Hearing loss in right ear 10/14/2011  . Agent orange exposure 10/14/2011  . ADD (attention deficit disorder) 10/14/2011  . Hyperlipidemia 10/14/2011  . BMI 28.0-28.9,adult 10/14/2011    Kary Kosilday, Dashanti Burr Suzanne, PT 01/16/2019, 4:26 PM   [facility] [address] [place], [place], [address] Phone: [phone]   Fax:  [phone]  Name: Jeff [name] Stockhausen MRN: [id number] Date of Birth: Jun 25, 1950

## 2019-01-20 ENCOUNTER — Other Ambulatory Visit: Payer: Self-pay

## 2019-01-20 ENCOUNTER — Ambulatory Visit: Payer: Medicare Other | Admitting: Physical Therapy

## 2019-01-20 DIAGNOSIS — R2681 Unsteadiness on feet: Secondary | ICD-10-CM

## 2019-01-20 DIAGNOSIS — R2689 Other abnormalities of gait and mobility: Secondary | ICD-10-CM

## 2019-01-20 DIAGNOSIS — R293 Abnormal posture: Secondary | ICD-10-CM

## 2019-01-20 DIAGNOSIS — I69351 Hemiplegia and hemiparesis following cerebral infarction affecting right dominant side: Secondary | ICD-10-CM | POA: Diagnosis not present

## 2019-01-20 NOTE — Patient Instructions (Signed)
Access Code: [email or web address]  URL: [email or web address]  Date: 01/20/2019  Prepared by: Nita Sells   Exercises  Standing Romberg to 1/2 Tandem Stance - 3 reps - 10 second hold - 1x daily - 7x weekly  Single Leg Heel Raise - 10 reps - 3 SECONDS hold - 1x daily - 7x weekly  Standing Single Leg Stance with Unilateral Counter Support - 3 reps - 1 sets - 10 sconds hold - 1x daily - 7x weekly  Standing with Head Rotation - 10 reps - 1 sets - 1x daily - 7x weekly  Standing with Head Nod - 10 reps - 1x daily - 7x weekly  Standing Balance with Eyes Closed - 4 reps - 10 seconds hold - 1x daily - 7x weekly

## 2019-01-20 NOTE — Therapy (Signed)
[facility] [address] [place], [place], [address] Phone: [phone]   Fax:  [phone]  Physical Therapy Treatment  Patient Details  Name: Jeff AbleJay W Gauss MRN: [id number] Date of Birth: 1950-06-17 Referring Provider (PT): Dr. Claudette LawsAndrew Kirsteins   Encounter Date: 01/20/2019  PT End of Session - 01/20/19 1312    Visit Number  2    Number of Visits  17    Date for PT Re-Evaluation  03/16/19    Authorization Type  U[employer] Medicare    Authorization Time Period  01-15-19 - 04-15-19    PT Start Time  1020    PT Stop Time  1100    PT Time Calculation (min)  40 min    Activity Tolerance  Patient tolerated treatment well    Behavior During Therapy  [facility] for tasks assessed/performed       Past Medical History:  Diagnosis Date  . ADHD (attention deficit hyperactivity disorder)   . Diabetes mellitus   . Hyperlipidemia   . Hypertension     Past Surgical History:  Procedure Laterality Date  . NECK SURGERY    . SPINE SURGERY     Cervical spine x 2; [employer]; [employer].    There were no vitals filed for this visit.  Subjective Assessment - 01/20/19 1023    Subjective  Pt reports cooking dinner last night and doing laundry.  Feet are a little sore and were swollen last night.    Patient is accompained by:  Family member   wife Darl PikesSusan (waiting in lobby)   Pertinent History  Type 2 DM with diabetic retinopathy (did not drive prior to CVA); stage 3 kidney disease; h/o cervical fusion C5-7 (approx. 2012) and h/o neck injury in Dec. 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  Yes    Pain Score  3      Pain Location  Foot    Pain Orientation  Right;Left    Pain Descriptors / Indicators  Tightness    Pain Type  Acute pain    Pain Frequency  Intermittent    Aggravating Factors   standing    Pain Relieving Factors  resting         [facility] PT Assessment - 01/20/19 0001      Functional Gait  Assessment   Gait assessed   Yes    Gait Level Surface  Walks 20 ft, slow speed, abnormal gait pattern, evidence for imbalance or deviates 10-15 in outside of the 12 in walkway width. Requires more than 7 sec to ambulate 20 ft.    Change in Gait Speed  Makes only minor adjustments to walking speed, or accomplishes a change in speed with significant gait deviations, deviates 10-15 in outside the 12 in walkway width, or changes speed but loses balance but is [name] to recover and continue walking.    Gait with Horizontal Head Turns  Performs head turns smoothly with slight change in gait velocity (eg, minor disruption to smooth gait path), deviates 6-10 in outside 12 in walkway width, or uses an assistive device.  Gait with Vertical Head Turns  Performs task with slight change in gait velocity (eg, minor disruption to smooth gait path), deviates 6 - 10 in outside 12 in walkway width or uses assistive device    Gait and Pivot Turn  Pivot turns safely in greater than 3 sec and stops with no loss of balance, or pivot turns safely within 3 sec and stops with mild imbalance, requires small steps to catch balance.    Step Over Obstacle  Is [name] to step over one shoe box (4.5 in total height) but must slow down and adjust steps to clear box safely. May require verbal cueing.    Gait with Narrow Base of Support  Ambulates less than 4 steps heel to toe or cannot perform without assistance.    Gait with Eyes Closed  Walks 20 ft, slow speed, abnormal gait pattern, evidence for imbalance, deviates 10-15 in outside 12 in walkway width. Requires more than 9 sec to ambulate 20 ft.    Ambulating Backwards  Walks 20 ft, slow  speed, abnormal gait pattern, evidence for imbalance, deviates 10-15 in outside 12 in walkway width.    Steps  Two feet to a stair, must use rail.    Total Score  12          [facility] Adult PT Treatment/Exercise - 01/20/19 0001      Ambulation/Gait   Ambulation/Gait  Yes    Ambulation/Gait Assistance  6: Modified independent (Device/Increase time)    Ambulation Distance (Feet)  --   through out gym for activities   Assistive device  None    Gait Pattern  Ataxic;Lateral trunk lean to left;Wide base of support;Trunk flexed    Ambulation Surface  Level;Indoor    Gait Comments  See FGA for details      Balance   Balance Assessed  Yes          Balance Exercises - 01/20/19 1309      Balance Exercises: Standing   Standing Eyes Opened  Wide (BOA);Head turns;Solid surface;Other (comment)   head turns, head nods with eyes open 10 reps each   Standing Eyes Closed  Wide (BOA);Solid surface;10 secs;2 reps    Tandem Stance  Eyes open;Upper extremity support 2;4 reps;10 secs    SLS  Eyes open;Upper extremity support 2;4 reps;10 secs    Sidestepping  Upper extremity support;2 reps   8'   Other Standing Exercises  Backward walking along counter x 2 reps x 8';bil heel raise then RLE heel raise at counter x 5 each        PT Education - 01/20/19 1311    Education Details  HEP    Person(s) Educated  Patient;Spouse    Methods  Explanation;Demonstration;Handout    Comprehension  Verbalized understanding       PT Short Term Goals - 01/16/19 1550      PT SHORT TERM GOAL #1   Title  Improve Berg score from 45/56 to >/= 49/56 for reduced fall risk.    Baseline  45/56 on 01-15-19    Time  4    Period  Weeks    Status  New    Target Date  02/15/19      PT SHORT TERM GOAL #2   Title  Improve TUG score from 14.07 to </= 12.5 secs with no device for decr. fall risk.    Baseline  14.07 secs on 01-15-19    Time  4    Period  Weeks  Status  New    Target Date  02/15/19      PT SHORT  TERM GOAL #3   Title  Incr. gait velocity from 2.44 ft/sec to >/= 2.8 ft/sec for incr. gait efficiency, with pt demonstrating minimal Rt trunk lean.    Baseline  13.47 secs = 2.44 ft/sec without device    Time  4    Period  Weeks    Status  New    Target Date  02/15/19      PT SHORT TERM GOAL #4   Title  Incr. FGA score by at least 5 points from baseline to demonstrate improved balance with ambulation.    Baseline  to be completed next session    Time  4    Period  Weeks    Status  New    Target Date  02/15/19      PT SHORT TERM GOAL #5   Title  Independent in HEP for balance & strengthening exs.    Time  4    Period  Weeks    Status  New    Target Date  02/15/19        PT Long Term Goals - 01/16/19 1555      PT LONG TERM GOAL #1   Title  Increase Berg score to >/= 52/56 for reduced fall risk.    Baseline  4/56 on 01-15-19    Time  8    Period  Weeks    Status  New    Target Date  03/17/19      PT LONG TERM GOAL #2   Title  Improve TUG score to </= 10 secs without device to demonstrate high mobility.    Baseline  14.07 secs without device    Time  8    Period  Weeks    Status  New    Target Date  03/17/19      PT LONG TERM GOAL #3   Title  Improve FGA score by at least 8 points to demonstrate improved balance with gait and for reduced fall risk.    Time  8    Period  Weeks    Status  New    Target Date  03/17/19      PT LONG TERM GOAL #4   Title  Increase gait velocity to >/= 3.2 ft/sec for incr. gait efficiency with minimal postural deviations exhibited.    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    Time  8    Period  Weeks    Status  New    Target Date   03/17/19      Additional Long Term Goals   Additional Long Term Goals  Yes      PT LONG TERM GOAL #6   Title  Pt will be independent in updated HEP as appropriate, including aquatic exercises.    Time  8    Period  Weeks    Status  New    Target Date  03/17/19  Plan - 01/20/19 1109    Clinical Impression Statement  Pt scored 12 on FGA during session.  Developed HEP to address balance issues.  Continue PT per POC.    Personal Factors and Comorbidities  Comorbidity 2;Transportation    Comorbidities  h/o cervical fusion (C5-7) approx. 2012 with neck injury in Dec. 2016 (pt slipped off edge of bed):  CKD stage 3;  diabetic retinopathy bil. 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  Cont high level balance and gait    PT Home Exercise Plan  Access Code: [email or web address] URL: [email or web address] Date: 01/20/2019 Prepared by: Modena Morrowenise Yakira Duquette    Consulted and Agree with Plan of Care  Patient;Family member/caregiver    Family Member Consulted  wife Darl PikesSusan       Patient will benefit from skilled therapeutic intervention in order to improve the following deficits and impairments:  Abnormal gait, Decreased activity tolerance, Decreased  balance, Decreased strength, Decreased endurance, Impaired vision/preception, Decreased coordination  Visit Diagnosis: 1. Hemiplegia and hemiparesis following cerebral infarction affecting right dominant side (HCC)   2. Other abnormalities of gait and mobility   3. Unsteadiness on feet   4. Abnormal posture        Problem List Patient Active Problem List   Diagnosis Date Noted  . CKD (chronic kidney disease) 08/28/2018  . Retinopathy of both eyes 08/28/2018  . 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 Stroke (HCC) 08/08/2018  . Diabetes mellitus (HCC) 07/25/2015  . Neck injury 06/22/2015  . Hearing loss in right ear 10/14/2011  . Agent orange exposure 10/14/2011  . ADD (attention deficit disorder) 10/14/2011  . Hyperlipidemia 10/14/2011  . BMI 28.0-28.9,adult 10/14/2011    Newell Coralenise Terry Jaysean [place]ville, PTA [facility] 01/20/19 1:15 PM Phone: [phone] Fax: [phone]   [facility] [address] [place], [place], [address] Phone: [phone]   Fax:  [phone]  Name: Jeff AbleJay W [name] MRN: [id number] Date of Birth: May 29, 1951

## 2019-01-22 ENCOUNTER — Other Ambulatory Visit: Payer: Self-pay

## 2019-01-22 ENCOUNTER — Ambulatory Visit: Payer: Medicare Other | Admitting: Physical Therapy

## 2019-01-22 DIAGNOSIS — I69351 Hemiplegia and hemiparesis following cerebral infarction affecting right dominant side: Secondary | ICD-10-CM

## 2019-01-22 DIAGNOSIS — R2689 Other abnormalities of gait and mobility: Secondary | ICD-10-CM

## 2019-01-22 DIAGNOSIS — R2681 Unsteadiness on feet: Secondary | ICD-10-CM

## 2019-01-22 NOTE — Therapy (Signed)
[facility] [address] [place], [place], [address] Phone: [phone]   Fax:  [phone]  Physical Therapy Treatment  Patient Details  Name: Jeff [name] MRN: [id number] Date of Birth: 11-03-1950 Referring Provider (PT): Dr. Claudette LawsAndrew Kirsteins   Encounter Date: 01/22/2019  PT End of Session - 01/22/19 1226    Visit Number  3    Number of Visits  17    Date for PT Re-Evaluation  03/16/19    Authorization Type  UHC Medicare    Authorization Time Period  01-15-19 - 04-15-19    PT Start Time  0938    PT Stop Time  1018    PT Time Calculation (min)  40 min    Activity Tolerance  Patient tolerated treatment well    Behavior During Therapy  [facility] for tasks assessed/performed       Past Medical History:  Diagnosis Date  . ADHD (attention deficit hyperactivity disorder)   . Diabetes mellitus   . Hyperlipidemia   . Hypertension     Past Surgical History:  Procedure Laterality Date  . NECK SURGERY    . SPINE SURGERY     Cervical spine x 2; [employer]; [employer].    There were no vitals filed for this visit.  Subjective Assessment - 01/22/19 1222    Subjective  Denies any falls or changes.  Did work on the exercises since last visit.    Patient is accompained by:  Family member   wife Darl PikesSusan (waiting in lobby)   Pertinent History  Type 2 DM with diabetic retinopathy (did not drive prior to CVA); stage 3 kidney disease; h/o cervical fusion C5-7 (approx. 2012) and h/o neck injury in Dec. 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  No/denies      At counter: Performed SLS x 10 seconds  x 3 each side with bil UE support 1/2 tandem and tandem stance x 10 seconds x 3 each direction with bil UE Single leg heel raise x 10 the bil heel raise x 10  Corner balance: Wide BOS: Eyes open head turns x 20   Eyes open head nods x 20   Eyes closed static x 10 sec x 3 Narrow BOS: Eyes open head turns x 20   Eyes open head nods x 20   Eyes closed static x 10 sec x 3  At steps: Taps to alternating steps x 15 with 1 UE support Taps to 6" step, 12", 6", floor with 1 UE support Stepping onto/off 6" step alternating with 1 UE support  Discussed Stroke warning signs/symptoms and importance of HEP.    PT Education - 01/22/19 1224    Education Details  Reviewed HEP, Stroke warning signs/symptoms    Person(s) Educated  Patient;Spouse    Methods  Explanation;Demonstration;Handout    Comprehension  Verbalized understanding;Returned demonstration       PT Short Term Goals - 01/16/19 1550      PT SHORT TERM GOAL #1   Title  Improve Berg score from 45/56 to >/= 49/56 for reduced fall risk.  Baseline  45/56 on 01-15-19    Time  4    Period  Weeks    Status  New    Target Date  02/15/19      PT SHORT TERM GOAL #2   Title  Improve TUG score from 14.07 to </= 12.5 secs with no device for decr. fall risk.    Baseline  14.07 secs on 01-15-19    Time  4    Period  Weeks    Status  New    Target Date  02/15/19      PT SHORT TERM GOAL #3   Title  Incr. gait velocity from 2.44 ft/sec to >/= 2.8 ft/sec for incr. gait efficiency, with pt demonstrating minimal Rt trunk lean.    Baseline  13.47 secs = 2.44 ft/sec without device    Time  4    Period  Weeks    Status  New    Target Date  02/15/19      PT SHORT TERM GOAL #4   Title  Incr. FGA score by at least 5 points from baseline to demonstrate improved balance with ambulation.    Baseline  to be completed next session    Time  4    Period  Weeks    Status  New    Target Date  02/15/19      PT SHORT TERM GOAL #5   Title   Independent in HEP for balance & strengthening exs.    Time  4    Period  Weeks    Status  New    Target Date  02/15/19        PT Long Term Goals - 01/16/19 1555      PT LONG TERM GOAL #1   Title  Increase Berg score to >/= 52/56 for reduced fall risk.    Baseline  4/56 on 01-15-19    Time  8    Period  Weeks    Status  New    Target Date  03/17/19      PT LONG TERM GOAL #2   Title  Improve TUG score to </= 10 secs without device to demonstrate high mobility.    Baseline  14.07 secs without device    Time  8    Period  Weeks    Status  New    Target Date  03/17/19      PT LONG TERM GOAL #3   Title  Improve FGA score by at least 8 points to demonstrate improved balance with gait and for reduced fall risk.    Time  8    Period  Weeks    Status  New    Target Date  03/17/19      PT LONG TERM GOAL #4   Title  Increase gait velocity to >/= 3.2 ft/sec for incr. gait efficiency with minimal postural deviations exhibited.    Baseline  13.47 secs = 2.44 ft/sec no device    Time  8    Period  Weeks    Status  New    Target Date  03/17/19      PT LONG TERM GOAL #5   Title  Pt will perform modified tandem walk at least 50' without LOB in order to allow pt to resume participation in [employer] processionals in funerals at his church as he was doing prior to CVA.    Time  8    Period  Weeks    Status  New    Target Date  03/17/19      Additional Long Term Goals   Additional Long Term Goals  Yes      PT LONG TERM GOAL #6   Title  Pt will be independent in updated HEP as appropriate, including aquatic exercises.    Time  8    Period  Weeks    Status  New    Target Date  03/17/19            Plan - 01/22/19 1226    Clinical Impression Statement  Skilled session focused on balance activities/exercises.  Pt continues to have significant difficulties with SLS, decreased BOS, head movement and eyes closed putting him at risk for falls.  Continue PT per POC.    Personal  Factors and Comorbidities  Comorbidity 2;Transportation    Comorbidities  h/o cervical fusion (C5-7) approx. 2012 with neck injury in Dec. 2016 (pt slipped off edge of bed):  CKD stage 3;  diabetic retinopathy bil. 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    PT Home Exercise Plan  Access Code: [email or web address] URL: [email or web address] Date: 01/20/2019 Prepared by: Modena Morrowenise Robertson    Consulted and Agree with Plan of Care  Patient;Family member/caregiver    Family Member Consulted  wife Darl PikesSusan       Patient will benefit from skilled therapeutic intervention in order to improve the following deficits and impairments:  Abnormal gait, Decreased activity tolerance, Decreased balance, Decreased strength, Decreased endurance, Impaired vision/preception, Decreased coordination  Visit Diagnosis: Hemiplegia and hemiparesis following cerebral infarction affecting right dominant side (HCC)  Unsteadiness on feet  Other abnormalities of gait and mobility     Problem List Patient Active Problem List   Diagnosis Date Noted  . CKD (chronic kidney disease) 08/28/2018  . Retinopathy of both eyes 08/28/2018  . 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 Stroke (HCC)  08/08/2018  . Diabetes mellitus (HCC) 07/25/2015  . Neck injury 06/22/2015  . Hearing loss in right ear 10/14/2011  . Agent orange exposure 10/14/2011  . ADD (attention deficit disorder) 10/14/2011  . Hyperlipidemia 10/14/2011  . BMI 28.0-28.9,adult 10/14/2011    Newell Coralenise Terry Robertson, PTA [facility] 01/22/19 12:39 PM Phone: [phone] Fax: [phone]   [facility] [address] [place], [place], [address] Phone: [phone]   Fax:  [phone]  Name: Jeff [name] MRN: [id number] Date of Birth: Feb 12, 1951

## 2019-01-22 NOTE — Patient Instructions (Signed)
Warning Signs of a Stroke  A stroke is a medical emergency and should be treated right away-every second counts. A stroke is caused by a decrease or block in blood flow to the brain. When this occurs, certain areas of the brain do not get enough oxygen, and brain cells begin to die. A stroke can lead to brain damage and can sometimes be life-threatening. However, if someone having a stroke gets medical treatment right away, he or she has better chances of surviving and recovering from the stroke. Being able to recognize the symptoms of a stroke is very important. Types of strokes There are two main types of strokes:  Ischemic strokes. This is the most common type of stroke. These strokes happen when a blood vessel that supplies blood to the brain is being blocked.  Hemorrhagic strokes. These strokes result from bleeding in the brain due to a blood vessel leaking or bursting (rupturing). A transient ischemic attack (TIA) is a "warning stroke" that causes stroke-like symptoms that go away quickly. Unlike a stroke, a TIA does not cause permanent damage to the brain. However, the symptoms of a TIA are the same as a stroke, and they also require medical treatment right away. Having a TIA is a sign that you are at higher risk for a permanent stroke. Warning signs of a stroke The symptoms of stroke may vary and will reflect the part of the brain that is involved. Symptoms usually happen suddenly. "BE FAST" is an easy way to remember the main warning signs of a stroke. B - Balance Signs are dizziness, sudden trouble walking, or loss of balance. E - Eyes Signs are trouble seeing or a sudden change in vision. F - Face Signs are sudden weakness or numbness of the face, or the face or eyelid drooping on one side. A - Arms Signs are weakness or numbness in an arm. This happens suddenly and usually on one side of the body. S - Speech Signs are sudden trouble speaking, slurred speech, or trouble understanding  what people say. T - Time Time to call emergency services. Write down what time symptoms started. Other signs of a stroke Some less common signs of a stroke include:  A sudden, severe headache with no known cause.  Nausea or vomiting.  Seizure. A stroke may be happening even if only one "BE FAST" symptoms is present. These symptoms may represent a serious problem that is an emergency. Do not wait to see if the symptoms will go away. Get medical help right away. Call your local emergency services (911 in the [place] Do not drive yourself to the hospital. Summary  A stroke is a medical emergency and should be treated right away-every second counts.  "BE FAST" is an easy way to remember the main warning signs of a stroke.  Call local emergency services right away if you or someone else has any stroke symptoms, even if the symptoms go away.  Make note of what time the first symptoms appeared. Emergency responders or emergency room staff will need to know this information.  Do not wait to see if symptoms will go away. Call 911 even if only one of the "BE FAST" symptoms appears. This information is not intended to replace advice given to you by your health care provider. Make sure you discuss any questions you have with your health care provider. Document Released: 09/07/2016 Document Revised: 05/03/2017 Document Reviewed: 09/07/2016 Elsevier Patient Education  2020 [employer]  

## 2019-01-27 ENCOUNTER — Other Ambulatory Visit: Payer: Self-pay

## 2019-01-27 ENCOUNTER — Ambulatory Visit: Payer: Medicare Other | Admitting: Physical Therapy

## 2019-01-27 DIAGNOSIS — I69351 Hemiplegia and hemiparesis following cerebral infarction affecting right dominant side: Secondary | ICD-10-CM | POA: Diagnosis not present

## 2019-01-27 DIAGNOSIS — R2681 Unsteadiness on feet: Secondary | ICD-10-CM

## 2019-01-27 DIAGNOSIS — R2689 Other abnormalities of gait and mobility: Secondary | ICD-10-CM

## 2019-01-27 NOTE — Therapy (Signed)
[facility] [address] [place], [place], [address] Phone: [phone]   Fax:  [phone]  Physical Therapy Treatment  Patient Details  Name: Jeff [name] MRN: [id number] Date of Birth: 03/26/1951 Referring Provider (PT): Dr. Claudette LawsAndrew Kirsteins   Encounter Date: 01/27/2019  PT End of Session - 01/27/19 1410    Visit Number  4    Number of Visits  17    Date for PT Re-Evaluation  03/16/19    Authorization Type  UHC Medicare    Authorization Time Period  01-15-19 - 04-15-19    PT Start Time  1019    PT Stop Time  1100    PT Time Calculation (min)  41 min    Activity Tolerance  Patient tolerated treatment well    Behavior During Therapy  [facility] for tasks assessed/performed       Past Medical History:  Diagnosis Date  . ADHD (attention deficit hyperactivity disorder)   . Diabetes mellitus   . Hyperlipidemia   . Hypertension     Past Surgical History:  Procedure Laterality Date  . NECK SURGERY    . SPINE SURGERY     Cervical spine x 2; [employer]; [employer].    There were no vitals filed for this visit.  Subjective Assessment - 01/27/19 1303    Subjective  Has been working on exercises.  Blood sugars are registering >400 on his app.  The app only registers as high as 400 then just reads as "high".  Wife says exercises usually brings it down.    Patient is accompained by:  Family member   wife Darl PikesSusan (waiting in lobby)   Pertinent History  Type 2 DM with diabetic retinopathy (did not drive prior to CVA); stage 3 kidney disease; h/o cervical fusion C5-7 (approx. 2012) and h/o neck injury in Dec. 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  No/denies          [facility] - 01/27/19 0001      Transfers   Transfers  Sit to Stand;Stand to Sit    Number of Reps  2 sets;Other reps (comment);Other sets (comment)   5 reps x 2 sets from mat and repeated from chair   Transfer Cueing  very wide bos;educated on proper technique.  Provided as HEP.      Ambulation/Gait   Ambulation/Gait  Yes    Ambulation/Gait Assistance  6: Modified independent (Device/Increase time)    Ambulation Distance (Feet)  110 Feet   x 3    Assistive device  None    Gait Pattern  Ataxic;Lateral trunk lean to left;Wide base of support;Trunk flexed    Ambulation Surface  Level;Indoor          Balance Exercises - 01/27/19 1314      Balance Exercises: Standing   Standing Eyes Opened  Narrow base of support (BOS);Wide (BOA);Head turns;Foam/compliant surface;Solid surface;Other reps (comment)   >10 reps on various surfaces   Standing Eyes Closed  Narrow base of support (BOS);Wide (BOA);Head turns;Foam/compliant surface;Solid surface;Other reps (comment);Other (comment)   >10 reps  on various surfaces   Other Standing Exercises  Pt with decreased balance when diffent surfaces, decreased BOS or eyes closed.  Provided instructions on how to progress exercises at home and to only change one variable at a time.    Overall Comments  Other (comment)   Performed in corner for support and with UE support on chai     Scifit level 2.5 all 4 extremities x 8 minutes with rpm>65  PT Education - 01/27/19 1408    Education Details  Progressing corner balance exercises, addition of sit<>stand exercises    Person(s) Educated  Patient;Spouse    Methods  Explanation;Demonstration;Handout    Comprehension  Verbalized understanding       PT Short Term Goals - 01/16/19 1550      PT SHORT TERM GOAL #1   Title  Improve Berg score from 45/56 to >/= 49/56 for reduced fall risk.     Baseline  45/56 on 01-15-19    Time  4    Period  Weeks    Status  New    Target Date  02/15/19      PT SHORT TERM GOAL #2   Title  Improve TUG score from 14.07 to </= 12.5 secs with no device for decr. fall risk.    Baseline  14.07 secs on 01-15-19    Time  4    Period  Weeks    Status  New    Target Date  02/15/19      PT SHORT TERM GOAL #3   Title  Incr. gait velocity from 2.44 ft/sec to >/= 2.8 ft/sec for incr. gait efficiency, with pt demonstrating minimal Rt trunk lean.    Baseline  13.47 secs = 2.44 ft/sec without device    Time  4    Period  Weeks    Status  New    Target Date  02/15/19      PT SHORT TERM GOAL #4   Title  Incr. FGA score by at least 5 points from baseline to demonstrate improved balance with ambulation.    Baseline  to be completed next session    Time  4    Period  Weeks    Status  New    Target Date  02/15/19      PT SHORT TERM GOAL #5   Title  Independent in HEP for balance & strengthening exs.    Time  4    Period  Weeks    Status  New    Target Date  02/15/19        PT Long Term Goals - 01/16/19 1555      PT LONG TERM GOAL #1   Title  Increase Berg score to >/= 52/56 for reduced fall risk.    Baseline  4/56 on 01-15-19    Time  8    Period  Weeks    Status  New    Target Date  03/17/19      PT LONG TERM GOAL #2   Title  Improve TUG score to </= 10 secs without device to demonstrate high mobility.    Baseline  14.07 secs without device    Time  8    Period  Weeks    Status  New    Target Date  03/17/19      PT LONG TERM GOAL #3   Title  Improve FGA score by at least 8 points to demonstrate improved balance with gait and for reduced  fall risk.    Time  8    Period  Weeks    Status  New    Target Date  03/17/19      PT LONG TERM GOAL #4   Title  Increase gait velocity to >/= 3.2 ft/sec for incr. gait efficiency with minimal postural deviations exhibited.    Baseline  13.47 secs = 2.44 ft/sec no device    Time  8    Period   Weeks    Status  New    Target Date  03/17/19      PT LONG TERM GOAL #5   Title  Pt will perform modified tandem walk at least 50' without LOB in order to allow pt to resume participation in [employer] processionals in funerals at his church as he was doing prior to CVA.    Time  8    Period  Weeks    Status  New    Target Date  03/17/19      Additional Long Term Goals   Additional Long Term Goals  Yes      PT LONG TERM GOAL #6   Title  Pt will be independent in updated HEP as appropriate, including aquatic exercises.    Time  8    Period  Weeks    Status  New    Target Date  03/17/19            Plan - 01/27/19 1411    Clinical Impression Statement  Skilled session focused on balance activities with different variables.  Pt with continued difficulty with decreased BOS, eyes closed or compliant surfaces.  Pt with difficulty with sit<>stand and requires significant increase in BOS to balance with transfer.  Continue PT per POC.    Personal Factors and Comorbidities  Comorbidity 2;Transportation    Comorbidities  h/o cervical fusion (C5-7) approx. 2012 with neck injury in Dec. 2016 (pt slipped off edge of bed):  CKD stage 3;  diabetic retinopathy bil. 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  Add some strengthening exercises to HEP.  Cont high level balance and strengthening.    PT Home Exercise Plan  Access Code: [email or web address] URL: [email or web address] Date: 01/20/2019 Prepared by: Nita Sells    Consulted and Agree with Plan of Care  Patient;Family member/caregiver    Family Member Consulted  wife [name]       Patient will benefit from skilled therapeutic intervention in order to improve the following deficits and impairments:  Abnormal gait, Decreased activity tolerance, Decreased balance, Decreased strength, Decreased endurance, Impaired vision/preception, Decreased coordination  Visit Diagnosis: Unsteadiness on feet  Other abnormalities of gait and mobility  Hemiplegia and hemiparesis following cerebral infarction affecting right dominant side [facility])     Problem List Patient Active Problem List   Diagnosis Date Noted  . CKD (chronic kidney disease) 08/28/2018  .  Retinopathy of both eyes 08/28/2018  . 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 Stroke (HCC) 08/08/2018  . Diabetes mellitus (HCC) 07/25/2015  . Neck injury 06/22/2015  . Hearing loss in right ear 10/14/2011  . Agent orange exposure 10/14/2011  . ADD (attention deficit disorder) 10/14/2011  . Hyperlipidemia 10/14/2011  . BMI 28.0-28.9,adult 10/14/2011    Newell Coralenise Terry Rakin Lemelle, PTA [facility] 01/27/19 2:15 PM Phone: [phone] Fax: [phone]   [facility] [address] [place], [place], [address] Phone: [phone]   Fax:  [phone]  Name: Jeff [name] MRN: [id number] Date of Birth: August 08, 1950

## 2019-01-27 NOTE — Patient Instructions (Addendum)
For corner balance exercises: *Start with eyes open, feet apart, normal surface *Progress to eyes closed (with feet apart on normal surface) *Progress to feet together (with eye open on normal surface) *Progress to on pillows (with feet apart and eyes open)   Functional Quadriceps: Sit to Stand    Sit on edge of chair, feet flat on floor. Stand upright, extending knees fully. Repeat 10 times per set.  Do 4 sessions per day.  http://orth.exer.us/734   Copyright  VHI. All rights reserved.

## 2019-01-29 ENCOUNTER — Ambulatory Visit: Payer: Medicare Other | Admitting: Physical Therapy

## 2019-01-29 ENCOUNTER — Other Ambulatory Visit: Payer: Self-pay

## 2019-01-29 VITALS — BP 122/73 | HR 80

## 2019-01-29 DIAGNOSIS — I69351 Hemiplegia and hemiparesis following cerebral infarction affecting right dominant side: Secondary | ICD-10-CM | POA: Diagnosis not present

## 2019-01-29 DIAGNOSIS — R2681 Unsteadiness on feet: Secondary | ICD-10-CM

## 2019-01-29 DIAGNOSIS — R2689 Other abnormalities of gait and mobility: Secondary | ICD-10-CM

## 2019-01-29 NOTE — Therapy (Signed)
[facility] [address] [place], [place], [address] Phone: [phone]   Fax:  [phone]  Physical Therapy Treatment  Patient Details  Name: Jeff [name] MRN: [id number] Date of Birth: 1950-12-28 Referring Provider (PT): Dr. Claudette LawsAndrew Kirsteins   Encounter Date: 01/29/2019  PT End of Session - 01/29/19 1533    Visit Number  4    Number of Visits  17    Date for PT Re-Evaluation  03/16/19    Authorization Type  UHC Medicare    Authorization Time Period  01-15-19 - 04-15-19    PT Start Time  1231    PT Stop Time  1315    PT Time Calculation (min)  44 min    Activity Tolerance  Patient tolerated treatment well    Behavior During Therapy  [facility] for tasks assessed/performed       Past Medical History:  Diagnosis Date  . ADHD (attention deficit hyperactivity disorder)   . Diabetes mellitus   . Hyperlipidemia   . Hypertension     Past Surgical History:  Procedure Laterality Date  . NECK SURGERY    . SPINE SURGERY     Cervical spine x 2; [employer]; [employer].    Vitals:   01/29/19 1313 01/29/19 1314  BP: 117/83 122/73  Pulse:  80    Subjective Assessment - 01/29/19 1233    Subjective  Went to MD this morining about his blood sugars being high.  Has Neurologisit appointment this afternoon.  His blood sugar did not stay under 400 after last visit.  He [place] walk [place] after [place] sesion.    Patient is accompained by:  Family member   wife Jeff [name] (waiting in lobby)   Pertinent History  Type 2 DM with diabetic retinopathy (did not drive prior to CVA); stage 3 kidney disease; h/o cervical fusion C5-7 (approx. 2012) and h/o neck injury in Dec. 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  No/denies          [facility]/Exercise - 01/29/19 0001      Exercises   Exercises  Knee/Hip      Knee/Hip Exercises: Aerobic   Nustep  Workload 5-7 with >55 steps per minute x 5 minutes      Knee/Hip Exercises: Machines for Strengthening   Total Gym Leg Press  bil LE x 12 reps at 40#;single leg x 10 reps at 30#.  Pt with knee genu recurvatum on machine and needed cues to control.      Knee/Hip Exercises: Standing   Heel Raises  Both;Right;2 sets;10 reps;2 seconds   one set with both and one set with right only   Heel Raises Limitations  used counter for UE support and with right side pt rested L foot on lower open cabinet for balance    Knee Flexion  Both;Strengthening;2 sets;10 reps   3# weight   Hip Abduction  Both;Stengthening;2 sets;10 reps;Knee straight   3# weight   Hip Extension  Both;Stengthening;2 sets;10 reps;Knee straight   3# weight   Other Standing Knee  Exercises  hamstring curl both 2 sets x 10 with 3# weight         PT Short Term Goals - 01/16/19 1550      PT SHORT TERM GOAL #1   Title  Improve Berg score from 45/56 to >/= 49/56 for reduced fall risk.    Baseline  45/56 on 01-15-19    Time  4    Period  Weeks    Status  New    Target Date  02/15/19      PT SHORT TERM GOAL #2   Title  Improve TUG score from 14.07 to </= 12.5 secs with no device for decr. fall risk.    Baseline  14.07 secs on 01-15-19    Time  4    Period  Weeks    Status  New    Target Date  02/15/19      PT SHORT TERM GOAL #3   Title  Incr. gait velocity from 2.44 ft/sec to >/= 2.8 ft/sec for incr. gait efficiency, with pt demonstrating minimal Rt trunk lean.    Baseline  13.47 secs = 2.44 ft/sec without device    Time  4    Period  Weeks    Status  New    Target Date  02/15/19      PT SHORT TERM GOAL #4   Title  Incr. FGA score by at least 5  points from baseline to demonstrate improved balance with ambulation.    Baseline  to be completed next session    Time  4    Period  Weeks    Status  New    Target Date  02/15/19      PT SHORT TERM GOAL #5   Title  Independent in HEP for balance & strengthening exs.    Time  4    Period  Weeks    Status  New    Target Date  02/15/19        PT Long Term Goals - 01/16/19 1555      PT LONG TERM GOAL #1   Title  Increase Berg score to >/= 52/56 for reduced fall risk.    Baseline  4/56 on 01-15-19    Time  8    Period  Weeks    Status  New    Target Date  03/17/19      PT LONG TERM GOAL #2   Title  Improve TUG score to </= 10 secs without device to demonstrate high mobility.    Baseline  14.07 secs without device    Time  8    Period  Weeks    Status  New    Target Date  03/17/19      PT LONG TERM GOAL #3   Title  Improve FGA score by at least 8 points to demonstrate improved balance with gait and for reduced fall risk.    Time  8    Period  Weeks    Status  New    Target Date  03/17/19      PT LONG TERM GOAL #4   Title  Increase gait velocity to >/= 3.2 ft/sec for incr. gait efficiency with minimal postural deviations exhibited.    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    Time  8    Period  Weeks    Status  New    Target Date  03/17/19      Additional Long Term Goals   Additional Long Term Goals  Yes      PT LONG TERM GOAL #6   Title  Pt will be independent in updated HEP as appropriate, including aquatic exercises.    Time  8    Period  Weeks    Status  New    Target Date  03/17/19            Plan - 01/29/19 1534    Clinical Impression Statement   Skilled session today focused on LE strengthening and balance.  Pt having episodic dizzy spells during session and needing rest breaks during session.  See vitals for details but BP stable.  Pt feel like it was due to not eating lunch before session. Continue PT per POC.    Personal Factors and Comorbidities  Comorbidity 2;Transportation    Comorbidities  h/o cervical fusion (C5-7) approx. 2012 with neck injury in Dec. 2016 (pt slipped off edge of bed):  CKD stage 3;  diabetic retinopathy bil. 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    PT Home Exercise Plan  Access Code: [email or web address] URL: [email or web address] Date: 01/20/2019 Prepared by: Nita Sells    Consulted and Agree with Plan of Care  Patient;Family member/caregiver    Family Member Consulted  wife Jeff [name]       Patient will benefit from skilled therapeutic intervention in order to improve the following deficits and impairments:  Abnormal gait, Decreased activity tolerance, Decreased balance, Decreased strength, Decreased endurance, Impaired vision/preception, Decreased coordination  Visit Diagnosis: Hemiplegia and hemiparesis following cerebral infarction affecting right dominant side (HCC)  Unsteadiness on  feet  Other abnormalities of gait and mobility     Problem List Patient Active Problem List   Diagnosis Date Noted  . CKD (chronic kidney disease) 08/28/2018  . Retinopathy of both eyes 08/28/2018  . Stroke due to embolism of left vertebral artery ([place]) 08/12/2018  . Stroke ([place]) 08/08/2018  . Diabetes mellitus ([place]) 07/25/2015  . Neck injury 06/22/2015  . Hearing loss in right ear 10/14/2011  . Agent orange exposure 10/14/2011  . ADD (attention deficit disorder) 10/14/2011  . Hyperlipidemia 10/14/2011  . BMI 28.0-28.9,adult 10/14/2011    Jeff [name], PTA [place] 01/29/19 3:40 PM Phone: [phone] Fax: [place] [address] [place], [place], [address] Phone: [phone]   Fax:  [phone]  Name: Jeff [name] MRN: [id number] Date of Birth: 26-Mar-1951

## 2019-02-05 ENCOUNTER — Ambulatory Visit: Payer: Medicare Other | Attending: Physical Medicine & Rehabilitation | Admitting: Physical Therapy

## 2019-02-05 ENCOUNTER — Encounter: Payer: Self-pay | Admitting: Adult Health

## 2019-02-05 ENCOUNTER — Ambulatory Visit (INDEPENDENT_AMBULATORY_CARE_PROVIDER_SITE_OTHER): Payer: Medicare Other | Admitting: Adult Health

## 2019-02-05 ENCOUNTER — Other Ambulatory Visit: Payer: Self-pay

## 2019-02-05 VITALS — BP 113/75 | HR 77 | Temp 98.0°F | Ht 68.0 in | Wt 203.0 lb

## 2019-02-05 DIAGNOSIS — I10 Essential (primary) hypertension: Secondary | ICD-10-CM | POA: Diagnosis not present

## 2019-02-05 DIAGNOSIS — R2681 Unsteadiness on feet: Secondary | ICD-10-CM | POA: Diagnosis present

## 2019-02-05 DIAGNOSIS — R293 Abnormal posture: Secondary | ICD-10-CM | POA: Insufficient documentation

## 2019-02-05 DIAGNOSIS — Z794 Long term (current) use of insulin: Secondary | ICD-10-CM

## 2019-02-05 DIAGNOSIS — E785 Hyperlipidemia, unspecified: Secondary | ICD-10-CM | POA: Diagnosis not present

## 2019-02-05 DIAGNOSIS — I63532 Cerebral infarction due to unspecified occlusion or stenosis of left posterior cerebral artery: Secondary | ICD-10-CM | POA: Diagnosis not present

## 2019-02-05 DIAGNOSIS — I69322 Dysarthria following cerebral infarction: Secondary | ICD-10-CM

## 2019-02-05 DIAGNOSIS — R2689 Other abnormalities of gait and mobility: Secondary | ICD-10-CM

## 2019-02-05 DIAGNOSIS — I69351 Hemiplegia and hemiparesis following cerebral infarction affecting right dominant side: Secondary | ICD-10-CM | POA: Diagnosis present

## 2019-02-05 DIAGNOSIS — E119 Type 2 diabetes mellitus without complications: Secondary | ICD-10-CM | POA: Diagnosis not present

## 2019-02-05 DIAGNOSIS — R269 Unspecified abnormalities of gait and mobility: Secondary | ICD-10-CM

## 2019-02-05 DIAGNOSIS — F329 Major depressive disorder, single episode, unspecified: Secondary | ICD-10-CM

## 2019-02-05 NOTE — Progress Notes (Signed)
I agree with the above plan 

## 2019-02-05 NOTE — Progress Notes (Signed)
°[facility] °[address] °[place]. [place] [address] °[phone] ° °     VIRTUAL VISIT FOLLOW UP NOTE ° °[name] °Date of Birth:  10/30/1950 °Medical Record Number:  [id number]  ° °Reason for Referral:  hospital stroke follow up ° ° ° °CHIEF COMPLAINT:  °Chief Complaint  °Patient presents with  °• Follow-up  °  Stroke follow up room  9 with wife  [name] and her temp is  96  ° ° °HPI:  ° °Stroke admission 08/08/2018: Mr. Jeff [name] is a 67 y.o. male with history of HTN, ADHD, DM, HLD and currently being treated for an ulcer who presented with dizziness, gait difficulties, right facial numbness, facial droop and word finding difficulties.  CT head negative for hemorrhage and CTA showed left vertebral V4 occlusion, heavily calcified ICA siphons with moderate left and moderate to severe right siphon stenosis.  NIHSS 4 therefore TPA administered without complication.  Initial MRI negative for acute infarct.  Repeat MRI head showed acute nonhemorrhagic punctate infarct involving the left posterolateral medulla secondary to left VA dissection versus arthrosclerosis given risk factors and ICA siphon atherosclerosis.  2D echo unremarkable.  LDL 161 and A1c 9.6.  Initiated DAPT for 3 weeks and Plavix alone.  HTN stable.  Increase Crestor dosage from 20 mg to 40 mg.  Resumed home diabetic medication and met with diabetic nurse coordinator. No prior history of stroke.  Residual deficits of dizziness, balance deficits and ataxia therefore discharged to CIR for ongoing therapies.  Found to have central vestibular symptoms during CIR admission.  Discharged home on 08/27/2018. ° °Virtual visit 11/03/2018: He continues to receive home health PT due to ongoing balance/stability difficulties but overall improvement and uses a cane intermittently for assistance. Denies any recent falls. He continues to do exercises at home on his home including walking 4-5 miles per day 4 days weekly with his wife. He has been  able to walk on uneven surfaces without difficulty.  Denies additional speech deficits and completed ST/OT. Completed 3 weeks DAPT and continues on Plavix alone without side effects of bleeding or bruising.  Continues on Crestor without side effects myalgias. He [facility] to be [facility] provider [facility]ist for DM management with recent adjustments on insulin regimen. Blood pressure checked during todays visit at 100/62. Denies new or worsening stroke/TIA symptoms.  ° °Update 02/04/2019: [name] is a 68-year-old male who is being seen today for stroke follow-up visit accompanied by his wife.  He continues to have residual deficits of balance difficulties and stuttering of speech.  He was previously working with home health therapies with improvement of residual deficits and it was recommended to participate in therapy ongoing with outpatient but due to COVID-19 restrictions, he was unable to schedule outpatient therapy sessions initially.  He has since started outpatient physical therapy but was not receiving any therapy for approximately 2 months.  His wife does endorse some worsening of overall balance, speech and swallowing difficulties.  He will occasionally have stuttering of speech and increased clearing of throat or coughing with swallowing foods.  He plans on restarting HEP as recommended by prior home speech therapy.  His residual balance concern is greatest with uneven surfaces and occasionally on flat surfaces.  He is able to ambulate without assistive device.  Continues on clopidogrel without side effects of bleeding or bruising.  Continues on Crestor without myalgias.  Blood pressure satisfactory 113/75.  [facility] with [facility] for [facility] with improvement but continues   to be uncontrolled.  [facility] and plans on undergoing MRI, MRA and CT scan on September 23.  He is not sure why he is having the scans performed but per patient, scheduling  assistant stated diagnosis code for ordering included possible stroke.  He denies any other stroke like symptoms or deficits that he did not previously experience.  He has also been experiencing difficulty sleeping at night as he is fearful of reoccurring stroke and not waking up in the morning.  He becomes tearful while speaking of this.  He becomes anxious when speaking about his initial presentation of symptoms and when EMS arrived to his home stating he was having a stroke.  He is not currently on any type of depression/anxiety medication or management.  He denies previously having difficulties with depression.  He denies depression or anxiety throughout the day and his only concern is sleeping at night due to fear of waking up. ° ° ° ° °ROS:   °14 system review of systems performed and negative with exception of gait difficulties, speech difficulties, coughing, anxiety and depression ° ° ° °PMH:  °Past Medical History:  °Diagnosis Date  °• ADHD (attention deficit hyperactivity disorder)   °• Diabetes mellitus   °• Hyperlipidemia   °• Hypertension   ° ° °PSH:  °Past Surgical History:  °Procedure Laterality Date  °• NECK SURGERY    °• SPINE SURGERY    ° Cervical spine x 2; [place]; [place].  ° ° °Social History:  °Social History  ° °Socioeconomic History  °• Marital status: Single  °  Spouse name: Not on file  °• Number of children: Not on file  °• Years of education: Not on file  °• Highest education level: Not on file  °Occupational History  °• Occupation: Financial Examiner Investigator  °Social Needs  °• Financial resource strain: Not on file  °• Food insecurity  °  Worry: Not on file  °  Inability: Not on file  °• Transportation needs  °  Medical: Not on file  °  Non-medical: Not on file  °Tobacco Use  °• Smoking status: Never Smoker  °• Smokeless tobacco: Never Used  °Substance and Sexual Activity  °• Alcohol use: No  °  Alcohol/week: 0.0 standard drinks  °• Drug use: No  °• Sexual activity: Yes    °Lifestyle  °• Physical activity  °  Days per week: Not on file  °  Minutes per session: Not on file  °• Stress: Not on file  °Relationships  °• Social connections  °  Talks on phone: Not on file  °  Gets together: Not on file  °  Attends religious service: Not on file  °  Active member of club or organization: Not on file  °  Attends meetings of clubs or organizations: Not on file  °  Relationship status: Not on file  °• Intimate partner violence  °  Fear of current or ex partner: Not on file  °  Emotionally abused: Not on file  °  Physically abused: Not on file  °  Forced sexual activity: Not on file  °Other Topics Concern  °• Not on file  °Social History Narrative  °• Not on file  ° ° °Family History:  °Family History  °Problem Relation Age of Onset  °• Heart disease Mother   °     CHF  °• Diabetes Mother   °• Heart disease Father   °• Cancer   Cancer Brother     Medications:   Current Outpatient Medications on File Prior to Visit  Medication Sig Dispense Refill   acetaminophen (TYLENOL) 325 MG tablet Take 1-2 tablets (325-650 mg total) by mouth every 4 (four) hours as needed for mild pain.     calcium carbonate (TUMS EX) 750 MG chewable tablet Chew 2 tablets by mouth as needed for heartburn.     clopidogrel (PLAVIX) 75 MG tablet TAKE 1 TABLET BY MOUTH DAILY 90 tablet 0   glucose blood (ONETOUCH VERIO) test strip 1 each by Other route 4 (four) times daily -  before meals and at bedtime. 100 each 12   insulin aspart (NOVOLOG) 100 UNIT/ML injection Inject 4 Units into the skin 2 (two) times daily. With breakfast and lunch (Patient taking differently: Inject 4 Units into the skin 2 (two) times daily. Sliding scale) 10 mL 11   insulin glargine (LANTUS) 100 UNIT/ML injection Inject 40 Units into the skin 2 (two) times daily. 40 in morning and 40 at night     insulin NPH Human (NOVOLIN N) 100 UNIT/ML injection Use 50 units with breakfast and 40 units after supper. 110 mL 11   Insulin Syringe-Needle U-100  (B-D INS SYR ULTRAFINE 1CC/31G) 31G X 5/16" 1 ML MISC Used to inject insulin twice daily. 90 each 2   lisinopril (ZESTRIL) 2.5 MG tablet Take 2.5 mg by mouth daily.     pantoprazole (PROTONIX) 40 MG tablet TAKE 1 TABLET BY MOUTH AT BEDTIME 90 tablet 0   rosuvastatin (CRESTOR) 40 MG tablet TAKE 1 TABLET BY MOUTH DAILY 90 tablet 0   No current facility-administered medications on file prior to visit.     Allergies:   Allergies  Allergen Reactions   Flexeril [Cyclobenzaprine Hcl] Other (See Comments)    Causes him to pass out and lowers his BP   Lidocaine Swelling    throat   Novocain [Procaine Hcl] Swelling     Physical Exam  Vitals:   02/05/19 0943  BP: 113/75  Pulse: 77  Temp: 98 F (36.7 C)  Weight: 203 lb (92.1 kg)  Height: 5' 8" (1.727 m)   Body mass index is 30.87 kg/m. No exam data present  General: well developed, well nourished,  pleasant elderly Caucasian male, seated, in no evident distress Head: head normocephalic and atraumatic.   Neck: supple with no carotid or supraclavicular bruits Cardiovascular: regular rate and rhythm, no murmurs Musculoskeletal: no deformity Skin:  no rash/petichiae Vascular:  Normal pulses all extremities   Neurologic Exam Mental Status: Awake and fully alert.   Mild dysarthria.  Oriented to place and time. Recent and remote memory intact. Attention span, concentration and fund of knowledge appropriate. Mood and affect appropriate.  Cranial Nerves: Fundoscopic exam reveals sharp disc margins. Pupils equal, briskly reactive to light. Extraocular movements full without nystagmus. Visual fields full to confrontation. Hearing intact. Facial sensation intact.  Mild right lower facial droop. Motor: Normal bulk and tone. Normal strength in all tested extremity muscles  Sensory.: intact to touch , pinprick , position and vibratory sensation.  Coordination: Decreased right hand dexterity.  Mild RUE and RLE ataxia Gait and Station:  Arises from chair without difficulty. Stance is normal. Gait demonstrates normal stride length with evidence of mild imbalance but is able to ambulate without assistance Reflexes: 1+ and symmetric. Toes downgoing.       Diagnostic Data (Labs, Imaging, Testing)  Ct Angio Head W Or Wo Contrast Ct Angio Neck W Or Wo  08/08/2018 °IMPRESSION:  °1. Positive for Left Vertebral Artery V4 occlusion. The Right vertebral artery and basilar remain normal.  °2. Superimposed moderate left vertebral artery origin stenosis appears related to soft plaque.  °3. Heavily calcified ICA siphons with moderate Left and moderate to severe Right siphon stenosis. No carotid stenosis in the neck.  °4. Moderate Left MCA M2 origin stenosis.  °5. Lower cervical ACDF with pseudoarthrosis and hardware loosening at C6-C7.  °  °Ct Head Code Stroke Wo Contrast °08/08/2018 °IMPRESSION:  °1. No acute finding  °2. Atrophy and chronic small vessel ischemia that has progressed from 2016.  °  °MRI Brain WO Contrast °08/10/2018 °IMPRESSION: °1. No acute infarct. °2. Chronic small vessel ischemia and volume loss. °  °  °MRI Brain WO Contrast (Repeat) °08/09/2018 °IMPRESSION: °1. 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 °2. Otherwise stable atrophy and white matter disease. °  °  °Transthoracic Echocardiogram  °08/08/2018 °IMPRESSIONS ° 1. The left ventricle has normal systolic function with an ejection fraction of 60-65%. The cavity size was normal. Left ventricular diastolic parameters were normal. ° 2. The right ventricle has normal systolic function. The cavity was normal. There is no increase in right ventricular wall thickness. ° 3. Left atrial size was mildly dilated. ° 4. The mitral valve is degenerative. Mild thickening of the mitral valve leaflet. ° 5. The tricuspid valve is normal in structure. ° 6. The aortic valve is tricuspid Moderate thickening of the aortic valve Moderate calcification of  the aortic valve. ° 7. The pulmonic valve was normal in structure. ° 8. The interatrial septum was not well visualized. ° ° ° °ASSESSMENT: Jeff [name] is a 68 y.o. year old male here with left cerebellum DWI negative infarct due to left ICA occlusion status post TPA on 08/09/2018 secondary to left VA occlusion versus arthrosclerosis given risk factors and ICA siphon atherosclerosis. Vascular risk factors include HTN, HLD and DM.  Residual deficits of balance/gait stability and reoccurring dysarthria, mild right lower facial weakness and subjective mild swallowing difficulties.  Deficits slightly worsened while he was not receiving therapy due to being unable to receive outpatient therapy from COVID-19 restrictions ° ° ° °PLAN: ° °1. Left cerebellum infarct: Continue clopidogrel 75 mg daily  and Crestor 40 mg for secondary stroke prevention. Maintain strict control of hypertension with blood pressure goal below 130/90, diabetes with hemoglobin A1c goal below 6.5% and cholesterol with LDL cholesterol (bad cholesterol) goal below 70 mg/dL.  I also advised the patient to eat a healthy diet with plenty of whole grains, cereals, fruits and vegetables, exercise regularly with at least 30 minutes of continuous activity daily and maintain ideal body weight. °2. Gait difficulties: Continue to work with outpatient PT along with continuation of HEP for ongoing improvement °3. Dysarthria/?  Dysphagia: Initially present during stroke work-up in 08/2018 but resolved with participation in therapies.  Likely reoccurred during the transition of not receiving therapies as he was not continuously doing exercises at home.  Potentially reasoning for [facility] obtaining repeat images.  Request results be faxed to office once completed.  Also discussion regarding participation in outpatient speech therapy but patient would like to do home exercises first and will notify office if interested in participating in outpatient therapy °4. Post stroke  anxiety/depression/PTSD: Discussion regarding emotional changes and difficulties poststroke especially as he experienced worsening symptoms during lack of therapy.  Discussion regarding use of medication to help ease fears and/or referral to psychiatry/neuropsychology for further evaluation.  He would like to think about this at home and will call office if interested or follow-up with   PCP °5. HTN: Advised to continue current treatment regimen.  Advised to continue to monitor at home along with continued follow-up with PCP for management °6. HLD: Advised to continue current treatment regimen along with continued follow-up with PCP for future prescribing and monitoring of lipid panel °7. DMII: Advised to continue to monitor glucose levels at home along with continued follow-up with PCP/pharmacy for management and monitoring ° ° °Follow up in 4 months or call earlier if needed ° ° °Greater than 50% of time during this 30 minute visit was spent on counseling, explanation of diagnosis of left cerebellum infarct, reviewing risk factor management of HTN, HLD and DM, discussion regarding worsening of deficits during lack of therapy along with residual post stroke anxiety/depression/PTSD, planning of further management along with potential future management, and discussion with patient and family answering all questions. ° ° ° °Jessica McCue (VanSchaick), AGNP-BC ° °[facility] °[address] °[place], [place] [address] ° °Phone [phone] Fax [phone] °Note: This document was prepared with digital dictation and possible smart phrase technology. Any transcriptional errors that result from this process are unintentional. ° ° ° ° °

## 2019-02-05 NOTE — Therapy (Signed)
[facility] [address] [place], [place], [address] Phone: [phone]   Fax:  [phone]  Physical Therapy Treatment  Patient Details  Name: Jeff [name] MRN: [id number] Date of Birth: 1951/04/25 Referring Provider (PT): Dr. Claudette Laws   Encounter Date: 02/05/2019  PT End of Session - 02/05/19 1333    Visit Number  6    Number of Visits  17    Date for PT Re-Evaluation  03/16/19    Authorization Type  [employer] Medicare    Authorization Time Period  01-15-19 - 04-15-19    PT Start Time  0800    PT Stop Time  0845    PT Time Calculation (min)  45 min    Activity Tolerance  Patient tolerated treatment well    Behavior During Therapy  [facility] for tasks assessed/performed       Past Medical History:  Diagnosis Date  . ADHD (attention deficit hyperactivity disorder)   . Diabetes mellitus   . Hyperlipidemia   . Hypertension     Past Surgical History:  Procedure Laterality Date  . NECK SURGERY    . SPINE SURGERY     Cervical spine x 2; [employer]; [employer].    There were no vitals filed for this visit.  Subjective Assessment - 02/05/19 1321    Subjective  Pt states he went to [place] and they ordered MRI, MRA and CAT scan  - is scheduled for these tests on 02-25-19; has appt at 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  No/denies                       [facility] Adult PT Treatment/Exercise - 02/05/19 0824      Transfers   Transfers  Sit to Stand    Number of Reps  10 reps;Other sets (comment)   1 set 5 reps with Lt foot on balance bubble with min assist   Transfer Cueing  No UE support used; cues to narrow BOS, turn feet straight ahead       Ambulation/Gait   Ambulation/Gait  Yes    Ambulation/Gait Assistance  4: Min guard    Ambulation Distance (Feet)  350 Feet    Assistive device  None    Gait Pattern  Ataxic;Lateral trunk lean to left;Wide base of support;Trunk flexed    Ambulation Surface  Level;Indoor    Gait Comments  mirror used for visual feedback for pt to narrow BOS and point toes straight ahead           Balance Exercises - 02/05/19 1330      Balance Exercises: Standing   Step Ups  Forward;6 inch;UE support 1   RLE and LLE 10 reps each with minimal UE support   Cone Rotation  Solid surface;Right turn;Left turn;Other (comment)   CGA to min assist as balance decreases w/fatigue   Other Standing Exercises  pt had moderately large LOB with turning around inside // bars when he stepped on sloped ledge - mod assist needed to recover      Pt performed tap ups to 1st and 2nd step 5 reps each leg to each step with CGA  to min assist  Stepping over and back of orange hurdle - smaller hurdle on floor with min assist 10 reps each leg; larger hurdle performed  Inside // bars with decreasing UE support 10 reps each with cues to leg high  Pt performed marching with CGA to min assist 30' forwards/backwards Amb. On tip toes with CGA to min assist 30' x 2 reps; amb. On heels 30' x 2 reps with CGA to min assist  Pt performed SLS activity on each leg - tipping cone over and standing upright with min assist for recovery of LOB - 4 cones used   PT Short Term Goals - 02/05/19 1340      PT SHORT TERM GOAL #1   Title  Improve Berg score from 45/56 to >/= 49/56 for reduced fall risk.    Baseline  45/56 on 01-15-19    Time  4    Period  Weeks    Status  New    Target Date  02/15/19      PT SHORT TERM GOAL #2   Title  Improve TUG score from 14.07 to </= 12.5 secs with no device for decr. fall risk.    Baseline  14.07 secs on 01-15-19    Time  4    Period  Weeks    Status  New    Target Date  02/15/19      PT SHORT TERM GOAL #3   Title   Incr. gait velocity from 2.44 ft/sec to >/= 2.8 ft/sec for incr. gait efficiency, with pt demonstrating minimal Rt trunk lean.    Baseline  13.47 secs = 2.44 ft/sec without device    Time  4    Period  Weeks    Status  New    Target Date  02/15/19      PT SHORT TERM GOAL #4   Title  Incr. FGA score by at least 5 points from baseline to demonstrate improved balance with ambulation.    Baseline  to be completed next session    Time  4    Period  Weeks    Status  New    Target Date  02/15/19      PT SHORT TERM GOAL #5   Title  Independent in HEP for balance & strengthening exs.    Time  4    Period  Weeks    Status  New    Target Date  02/15/19        PT Long Term Goals - 02/05/19 1341      PT LONG TERM GOAL #1   Title  Increase Berg score to >/= 52/56 for reduced fall risk.    Baseline  4/56 on 01-15-19    Time  8    Period  Weeks    Status  New      PT LONG TERM GOAL #2   Title  Improve TUG score to </= 10 secs without device to demonstrate high mobility.    Baseline  14.07 secs without device    Time  8    Period  Weeks    Status  New      PT LONG TERM GOAL #3   Title  Improve FGA score by at least 8 points to demonstrate improved balance with gait and for reduced fall risk.    Time  8    Period  Weeks    Status  New      PT LONG TERM GOAL #4  Title  Increase gait velocity to >/= 3.2 ft/sec for incr. gait efficiency with minimal postural deviations exhibited.    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    Time  8    Period  Weeks    Status  New      PT LONG TERM GOAL #6   Title  Pt will be independent in updated HEP as appropriate, including aquatic exercises.    Time  8    Period  Weeks     Status  New            Plan - 02/05/19 0802    Clinical Impression Statement  Pt fatigues quickly with activity - frequent short seated rest breaks needed.  Pt demonstrates significantly decreased balance on RLE with mod assist frequently needed for recovery of LOB with Rt SLS.  Gait pattern was much improved at end of session with visual and verbal cues to narrow BOS and turn feet straight ahead, rather than positioning them in external rotation.    Rehab Potential  Good    PT Frequency  2x / week    PT Duration  8 weeks    PT Treatment/Interventions  ADLs/Self Care Home Management;Aquatic Therapy;Gait training;Stair training;Functional mobility training;Therapeutic activities;Therapeutic exercise;Balance training;Neuromuscular re-education;Patient/family education    PT Next Visit Plan  continue balance - especially Rt SLS activities;  gait training with use of mirror for feedback; trunk control if time allows;  PLEASE have pt add on appts 2x/week for 4 weeks with Derrill KaySuz., Jacob Mooresen., Kathy - thanks    PT Home Exercise Plan  Access Code: [email or web address] URL: [email or web address] Date: 01/20/2019 Prepared by: Modena Morrowenise Robertson       Patient will benefit from skilled therapeutic intervention in order to improve the following deficits and impairments:     Visit Diagnosis: Unsteadiness on feet  Other abnormalities of gait and mobility  Hemiplegia and hemiparesis following cerebral infarction affecting right dominant side [facility](HCC)     Problem List Patient Active Problem List   Diagnosis Date Noted  . CKD (chronic kidney disease) 08/28/2018  . Retinopathy of both eyes 08/28/2018  . 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 Stroke (HCC) 08/08/2018  . Diabetes mellitus (HCC) 07/25/2015  . Neck injury 06/22/2015  . Hearing loss in right ear 10/14/2011  . Agent orange exposure 10/14/2011  . ADD (attention deficit disorder) 10/14/2011  . Hyperlipidemia  10/14/2011  . BMI 28.0-28.9,adult 10/14/2011    Kary Kosilday, Jiayi Lengacher Suzanne, PT 02/05/2019, 1:43 PM  [facility] [address] [place], [place], [address] Phone: [phone]   Fax:  [phone]  Name: Leilani AbleJay W Eline MRN: [id number] Date of Birth: 23-Aug-1950

## 2019-02-05 NOTE — Patient Instructions (Signed)
Continue clopidogrel 75 mg daily  and Crestor for secondary stroke prevention  Continue to follow up with PCP regarding cholesterol, blood pressure and diabetes management   Continue to work with physical therapy for ongoing improvement of balance difficulties and mild right-sided weakness Please contact your office for let your current therapist know if you would like to participate in additional speech therapy sessions  Consider evaluation by therapy or potential use of antidepressant/antianxiety  -you may also follow-up with your PCP if you are interested in initiating any type of medication  Have images obtained on 9/23 with request of results faxed to our office  Continue to monitor blood pressure at home  Maintain strict control of hypertension with blood pressure goal below 130/90, diabetes with hemoglobin A1c goal below 6.5% and cholesterol with LDL cholesterol (bad cholesterol) goal below 70 mg/dL. I also advised the patient to eat a healthy diet with plenty of whole grains, cereals, fruits and vegetables, exercise regularly and maintain ideal body weight.  Followup in the future with me in 4 months or call earlier if needed       Thank you for coming to see [place] at [facility]. I hope we have been able to provide you high quality care today.  You may receive a patient satisfaction survey over the next few weeks. We would appreciate your feedback and comments so that we may continue to improve ourselves and the health of our patients.

## 2019-02-06 ENCOUNTER — Encounter: Payer: Self-pay | Admitting: Physical Therapy

## 2019-02-06 ENCOUNTER — Ambulatory Visit: Payer: Medicare Other | Admitting: Physical Therapy

## 2019-02-06 DIAGNOSIS — I69351 Hemiplegia and hemiparesis following cerebral infarction affecting right dominant side: Secondary | ICD-10-CM

## 2019-02-06 DIAGNOSIS — R2681 Unsteadiness on feet: Secondary | ICD-10-CM

## 2019-02-06 DIAGNOSIS — R2689 Other abnormalities of gait and mobility: Secondary | ICD-10-CM

## 2019-02-06 DIAGNOSIS — R293 Abnormal posture: Secondary | ICD-10-CM

## 2019-02-07 NOTE — Therapy (Signed)
Jeff [name] [address] [place], [place], [address] Phone: [phone]   Fax:  [phone]  Physical Therapy Treatment  Patient Details  Name: Jeff [name] MRN: [id number] Date of Birth: 1950-10-25 Referring Provider (PT): Dr. Alysia Penna   Encounter Date: 02/06/2019  PT End of Session - 02/06/19 0938    Visit Number  7    Number of Visits  17    Date for PT Re-Evaluation  03/16/19    Authorization Type  UHC Medicare    Authorization Time Period  01-15-19 - 04-15-19    PT Start Time  0935    PT Stop Time  1015    PT Time Calculation (min)  40 min    Equipment Utilized During Treatment  Gait belt    Activity Tolerance  Patient tolerated treatment well    Behavior During Therapy  [facility] for tasks assessed/performed       Past Medical History:  Diagnosis Date  . ADHD (attention deficit hyperactivity disorder)   . Diabetes mellitus   . Hyperlipidemia   . Hypertension     Past Surgical History:  Procedure Laterality Date  . NECK SURGERY    . SPINE SURGERY     Cervical spine x 2; Hardin Negus; [employer].    There were no vitals filed for this visit.  Subjective Assessment - 02/06/19 0936    Subjective  Reports he was sore and tired after session yesterday. No falls. May need to resume ST due swallowing issues.    Patient is accompained by:  Family member   spouse Jeff [name]   Pertinent History  Type 2 DM with diabetic retinopathy (did not drive prior to CVA); stage 3 kidney disease; h/o cervical fusion C5-7 (approx. 2012) and h/o neck injury in Dec. 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  No/denies             [facility] Adult PT Treatment/Exercise -  02/06/19 0944      Transfers   Transfers  Sit to Stand;Stand to Sit    Number of Reps  10 reps;1 set    Comments  seated with feet across blue foam beam: 10 reps with light UE assist, cues for full upright standing and slow, controlled descent with sitting. min guard assist.       Ambulation/Gait   Ambulation/Gait  Yes    Ambulation/Gait Assistance  4: Min guard;5: Supervision    Ambulation/Gait Assistance Details  cues for posture, forward foot position and more narrowed base of support with gait around gym with activities     Assistive device  None    Gait Pattern  Ataxic;Lateral trunk lean to left;Wide base of support;Trunk flexed    Ambulation Surface  Level;Indoor      Knee/Hip Exercises: Aerobic   Nustep  UE/LE on level 5 for 8 minutes with goal >/= 55 steps for strengthening and activity tolerance      Knee/Hip Exercises: Machines for Strengthening   Cybex Leg Press  bil LEs 50# for 2 sets of 10, then single leg with 40# for 10 reps. cues for slow, controlled movements with assist at bil knees for  control/prevent recurvatum          Balance Exercises - 02/06/19 0959      Balance Exercises: Standing   Standing Eyes Closed  Narrow base of support (BOS);Foam/compliant surface;3 reps;Limitations;30 secs;Other reps (comment);Wide (BOA)    Balance Beam  standing across blue foam beam: alternating fwd stepping to floor/back onto beam, then alternating bwd stepping to floor/backn onto beam- 10 reps each with light UE support on bars. min guard assist with cues on increased step length/step height.      Balance Exercises: Standing   Standing Eyes Closed Limitations  on one inch foam: feet together for EC no head movements, then feet apart for EC head movements left<>right, then up<>down. min assist with light fingertip support on chair back for balance.           PT Short Term Goals - 02/05/19 1340      PT SHORT TERM GOAL #1   Title  Improve Berg score from 45/56 to >/= 49/56  for reduced fall risk.    Baseline  45/56 on 01-15-19    Time  4    Period  Weeks    Status  New    Target Date  02/15/19      PT SHORT TERM GOAL #2   Title  Improve TUG score from 14.07 to </= 12.5 secs with no device for decr. fall risk.    Baseline  14.07 secs on 01-15-19    Time  4    Period  Weeks    Status  New    Target Date  02/15/19      PT SHORT TERM GOAL #3   Title  Incr. gait velocity from 2.44 ft/sec to >/= 2.8 ft/sec for incr. gait efficiency, with pt demonstrating minimal Rt trunk lean.    Baseline  13.47 secs = 2.44 ft/sec without device    Time  4    Period  Weeks    Status  New    Target Date  02/15/19      PT SHORT TERM GOAL #4   Title  Incr. FGA score by at least 5 points from baseline to demonstrate improved balance with ambulation.    Baseline  to be completed next session    Time  4    Period  Weeks    Status  New    Target Date  02/15/19      PT SHORT TERM GOAL #5   Title  Independent in HEP for balance & strengthening exs.    Time  4    Period  Weeks    Status  New    Target Date  02/15/19        PT Long Term Goals - 02/05/19 1341      PT LONG TERM GOAL #1   Title  Increase Berg score to >/= 52/56 for reduced fall risk.    Baseline  4/56 on 01-15-19    Time  8    Period  Weeks    Status  New      PT LONG TERM GOAL #2   Title  Improve TUG score to </= 10 secs without device to demonstrate high mobility.    Baseline  14.07 secs without device    Time  8    Period  Weeks    Status  New      PT LONG TERM GOAL #3   Title  Improve FGA score by at least 8 points to demonstrate  improved balance with gait and for reduced fall risk.    Time  8    Period  Weeks    Status  New      PT LONG TERM GOAL #4   Title  Increase gait velocity to >/= 3.2 ft/sec for incr. gait efficiency with minimal postural deviations exhibited.    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    Time  8    Period  Weeks    Status  New      PT LONG TERM GOAL #6   Title  Pt will be independent in updated HEP as appropriate, including aquatic exercises.    Time  8    Period  Weeks    Status  New            Plan - 02/06/19 0939    Clinical Impression Statement  Today's skilled session continued to focus on strengthening and balance reactions with no issues besides fatigue reported. Rest breaks taken throughout session. The pt is progressing toward goals and should benefit from continued PT to progress toward unmet goals.    Rehab Potential  Good    PT Frequency  2x / week    PT Duration  8 weeks    PT Treatment/Interventions  ADLs/Self Care Home Management;Aquatic Therapy;Gait training;Stair training;Functional mobility training;Therapeutic activities;Therapeutic exercise;Balance training;Neuromuscular re-education;Patient/family education    PT Next Visit Plan  continue balance - especially Rt SLS activities;  gait training with use of mirror for feedback; trunk control if time allows;    PT Home Exercise Plan  Access Code: [email or web address] URL: [email or web address] Date: 01/20/2019 Prepared by: Modena Morrowenise Robertson       Patient will benefit from skilled therapeutic intervention in order to improve the following deficits and impairments:     Visit Diagnosis: Unsteadiness on feet  Other abnormalities of gait and mobility  Hemiplegia and hemiparesis following cerebral infarction affecting right dominant side (HCC)  Abnormal posture     Problem List Patient Active Problem List   Diagnosis Date Noted  . CKD (chronic kidney disease) 08/28/2018  . Retinopathy of both eyes 08/28/2018  . 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 Stroke (HCC)  08/08/2018  . Diabetes mellitus (HCC) 07/25/2015  . Neck injury 06/22/2015  . Hearing loss in right ear 10/14/2011  . Agent orange exposure 10/14/2011  . ADD (attention deficit disorder) 10/14/2011  . Hyperlipidemia 10/14/2011  . BMI 28.0-28.9,adult 10/14/2011    Sallyanne KusterKathy Bury, PTA, [facility] [address], [address] [place], [place] [address] [phone] 02/07/19, 2:41 PM   Name: Jeff AbleJay W [name] MRN: [id number] Date of Birth: 1951/02/27

## 2019-02-10 ENCOUNTER — Ambulatory Visit: Payer: Medicare Other | Admitting: Physical Therapy

## 2019-02-10 ENCOUNTER — Other Ambulatory Visit: Payer: Self-pay

## 2019-02-10 DIAGNOSIS — R2681 Unsteadiness on feet: Secondary | ICD-10-CM | POA: Diagnosis not present

## 2019-02-10 DIAGNOSIS — R2689 Other abnormalities of gait and mobility: Secondary | ICD-10-CM

## 2019-02-11 NOTE — Therapy (Signed)
[facility] [address] [place], [place], [address] Phone: [phone]   Fax:  [phone]  Physical Therapy Treatment  Patient Details  Name: Jeff [name] MRN: [id number] Date of Birth: 10-21-1950 Referring Provider (PT): Dr. Claudette Laws   Encounter Date: 02/10/2019  PT End of Session - 02/11/19 2051    Visit Number  8    Number of Visits  17    Date for PT Re-Evaluation  03/16/19    Authorization Type  UHC Medicare    Authorization Time Period  01-15-19 - 04-15-19    PT Start Time  1147    PT Stop Time  1233    PT Time Calculation (min)  46 min    Equipment Utilized During Treatment  Gait belt    Activity Tolerance  Patient tolerated treatment well    Behavior During Therapy  [facility] for tasks assessed/performed       Past Medical History:  Diagnosis Date  . ADHD (attention deficit hyperactivity disorder)   . Diabetes mellitus   . Hyperlipidemia   . Hypertension     Past Surgical History:  Procedure Laterality Date  . NECK SURGERY    . SPINE SURGERY     Cervical spine x 2; [employer]; [employer].    There were no vitals filed for this visit.  Subjective Assessment - 02/11/19 2043    Subjective  Pt reports no new problems or issues    Patient is accompained by:  Family member   spouse Jeff [name]   Pertinent History  Type 2 DM with diabetic retinopathy (did not drive prior to CVA); stage 3 kidney disease; h/o cervical fusion C5-7 (approx. 2012) and h/o neck injury in Dec. 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  No/denies                       [facility] Adult PT Treatment/Exercise - 02/11/19 0001      Transfers   Transfers  Sit to  Stand    Number of Reps  Other reps (comment)   5   Comments  feet on blue [employer] - no UE support used          Balance Exercises - 02/11/19 2045      Balance Exercises: Standing   Standing Eyes Closed  Wide (BOA);Head turns;Foam/compliant surface;5 reps    Rockerboard  Anterior/posterior;EO;10 reps;Intermittent UE support    Step Ups  Forward;6 inch;Intermittent UE support   10 reps each leg   Balance Beam  standing perpendicular on blue foam balance beam with UE support prn; standing tandem on blue beam with UE support prn     Tandem Gait  Forward;Retro;4 reps   inside // bars with UE support prn with LOB   Step Over Hurdles / Cones  over and back of black balance beam 10 reps each leg       Pt performed SLS activity on each leg - tipping cone over and standing upright with min assist for recovery of LOB - 4 cones used  Tap ups to 1st step 10 reps each foot without UE support with CGA  PT Short Term Goals - 02/11/19 2054      PT SHORT TERM GOAL #1   Title  Improve Berg score from 45/56 to >/= 49/56 for reduced fall risk.    Baseline  45/56 on 01-15-19    Time  4    Period  Weeks    Status  New    Target Date  02/15/19      PT SHORT TERM GOAL #2   Title  Improve TUG score from 14.07 to </= 12.5 secs with no device for decr. fall risk.    Baseline  14.07 secs on 01-15-19    Time  4    Period  Weeks    Status  New    Target Date  02/15/19      PT SHORT TERM GOAL #3   Title  Incr. gait velocity from 2.44 ft/sec to >/= 2.8 ft/sec for incr. gait efficiency, with pt demonstrating minimal Rt trunk lean.    Baseline  13.47 secs = 2.44 ft/sec without device    Time  4    Period  Weeks    Status  New    Target Date  02/15/19      PT SHORT TERM GOAL #4   Title  Incr. FGA score by at least 5 points from baseline to demonstrate improved balance with ambulation.    Baseline  to be completed next session    Time  4    Period  Weeks    Status  New    Target Date   02/15/19      PT SHORT TERM GOAL #5   Title  Independent in HEP for balance & strengthening exs.    Time  4    Period  Weeks    Status  New    Target Date  02/15/19        PT Long Term Goals - 02/11/19 2054      PT LONG TERM GOAL #1   Title  Increase Berg score to >/= 52/56 for reduced fall risk.    Baseline  4/56 on 01-15-19    Time  8    Period  Weeks    Status  New      PT LONG TERM GOAL #2   Title  Improve TUG score to </= 10 secs without device to demonstrate high mobility.    Baseline  14.07 secs without device    Time  8    Period  Weeks    Status  New      PT LONG TERM GOAL #3   Title  Improve FGA score by at least 8 points to demonstrate improved balance with gait and for reduced fall risk.    Time  8    Period  Weeks    Status  New      PT LONG TERM GOAL #4   Title  Increase gait velocity to >/= 3.2 ft/sec for incr. gait efficiency with minimal postural deviations exhibited.    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    Time  8  Period  Weeks    Status  New      PT LONG TERM GOAL #6   Title  Pt will be independent in updated HEP as appropriate, including aquatic exercises.    Time  8    Period  Weeks    Status  New            Plan - 02/11/19 2052    Clinical Impression Statement  Pt is progressing well towards goals; pt continues to fatigue easily and requires short frequent rest periods.  Pt able to perform tandem walking inside // bars with min to mod LOB but able to recover with use of // bars for support.    Rehab Potential  Good    PT Frequency  2x / week    PT Duration  8 weeks    PT Treatment/Interventions  ADLs/Self Care Home Management;Aquatic Therapy;Gait  training;Stair training;Functional mobility training;Therapeutic activities;Therapeutic exercise;Balance training;Neuromuscular re-education;Patient/family education    PT Next Visit Plan  continue balance - especially Rt SLS activities;  gait training with use of mirror for feedback; trunk control if time allows;    PT Home Exercise Plan  Access Code: [email or web address] URL: [email or web address] Date: 01/20/2019 Prepared by: Modena Morrowenise Robertson       Patient will benefit from skilled therapeutic intervention in order to improve the following deficits and impairments:     Visit Diagnosis: Unsteadiness on feet  Other abnormalities of gait and mobility     Problem List Patient Active Problem List   Diagnosis Date Noted  . CKD (chronic kidney disease) 08/28/2018  . Retinopathy of both eyes 08/28/2018  . 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 Stroke (HCC) 08/08/2018  . Diabetes mellitus (HCC) 07/25/2015  . Neck injury 06/22/2015  . Hearing loss in right ear 10/14/2011  . Agent orange exposure 10/14/2011  . ADD (attention deficit disorder) 10/14/2011  . Hyperlipidemia 10/14/2011  . BMI 28.0-28.9,adult 10/14/2011    Kary Kosilday, Dorien Mayotte Suzanne, PT 02/11/2019, 8:56 PM  [facility] [address] [place], [place], [address] Phone: [phone]   Fax:  [phone]  Name: Leilani AbleJay W Hinzman MRN: [id number] Date of Birth: Jun 12, 1950

## 2019-02-12 ENCOUNTER — Ambulatory Visit: Payer: Medicare Other | Admitting: Physical Therapy

## 2019-02-12 ENCOUNTER — Other Ambulatory Visit: Payer: Self-pay

## 2019-02-12 DIAGNOSIS — R2681 Unsteadiness on feet: Secondary | ICD-10-CM

## 2019-02-12 DIAGNOSIS — R2689 Other abnormalities of gait and mobility: Secondary | ICD-10-CM

## 2019-02-13 NOTE — Therapy (Signed)
[facility] [address] [place], [place], [address] Phone: [phone]   Fax:  [phone]  Physical Therapy Treatment  Patient Details  Name: Jeff [name] MRN: [id number] Date of Birth: 1951/01/10 Referring Provider (PT): Dr. Alysia Penna   Encounter Date: 02/12/2019  PT End of Session - 02/13/19 1548    Visit Number  9    Number of Visits  17    Date for PT Re-Evaluation  03/16/19    Authorization Type  [employer] Medicare    Authorization Time Period  01-15-19 - 04-15-19    PT Start Time  0847    PT Stop Time  0932    PT Time Calculation (min)  45 min    Equipment Utilized During Treatment  Gait belt    Activity Tolerance  Patient tolerated treatment well    Behavior During Therapy  [facility] for tasks assessed/performed       Past Medical History:  Diagnosis Date  . ADHD (attention deficit hyperactivity disorder)   . Diabetes mellitus   . Hyperlipidemia   . Hypertension     Past Surgical History:  Procedure Laterality Date  . NECK SURGERY    . SPINE SURGERY     Cervical spine x 2; Hardin Negus; [employer].    There were no vitals filed for this visit.  Subjective Assessment - 02/13/19 1540    Subjective  Pt states he is upset because the front office person would not allow his wife to come back with him for today's session    Patient is accompained by:  Family member   spouse Jeff [name]   Pertinent History  Type 2 DM with diabetic retinopathy (did not drive prior to CVA); stage 3 kidney disease; h/o cervical fusion C5-7 (approx. 2012) and h/o neck injury in Dec. 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  No/denies         [facility] PT Assessment - 02/13/19  0001      Berg Balance Test   Sit to Stand  Able to stand without using hands and stabilize independently    Standing Unsupported  Able to stand safely 2 minutes    Sitting with Back Unsupported but Feet Supported on Floor or Stool  Able to sit safely and securely 2 minutes    Stand to Sit  Sits safely with minimal use of hands    Transfers  Able to transfer safely, minor use of hands    Standing Unsupported with Eyes Closed  Able to stand 10 seconds safely    Standing Unsupported with Feet Together  Able to place feet together independently and stand 1 minute safely    From Standing, Reach Forward with Outstretched Arm  Can reach confidently >25 cm (10")    From Standing Position, Pick up Object from Floor  Able to pick up shoe, needs supervision    From Standing Position, Turn to Look Behind Over each Shoulder  Looks behind from both sides and weight shifts well    Turn 360 Degrees  Able to turn 360 degrees safely in 4 seconds or less    Standing Unsupported, Alternately Place Feet on Step/Stool  Able to stand independently and safely and complete 8 steps in 20 seconds    Standing Unsupported, One Foot in Front  Able to plae foot ahead of the other independently and hold 30 seconds    Standing on One Leg  Able to lift leg independently and hold equal to or more than 3 seconds    Total Score  52                   [facility] Adult PT Treatment/Exercise - 02/13/19 0001      Transfers   Transfers  Sit to Stand    Number of Reps  Other reps (comment)   5   Comments  feet on floor - pt positioned feet closer together without any cueing      Ambulation/Gait   Ambulation/Gait  Yes    Ambulation/Gait Assistance  5: Supervision    Gait Pattern  Ataxic;Lateral trunk lean to left;Wide base of support;Trunk flexed    Gait velocity  11.54 = 2.84 ft/sec       Standardized Balance Assessment   Standardized Balance Assessment  Timed Up and Go Test      Timed Up and Go Test   TUG  Normal  TUG    Normal TUG (seconds)  11.84          Balance Exercises - 02/13/19 1545      Balance Exercises: Standing   Standing Eyes Opened  Narrow base of support (BOS);Head turns;5 reps    Tandem Stance  Eyes open;2 reps;30 secs    Rockerboard  Anterior/posterior;EO;10 reps;Intermittent UE support    Step Ups  Forward;6 inch;Intermittent UE support   10 reps each leg   Balance Beam  standing across blue foam beam: alternating fwd stepping to floor/back onto beam, then alternating bwd stepping to floor/backn onto beam- 10 reps each with light UE support on bars. min guard assist with cues on increased step length/step height.    Tandem Gait  Forward;Retro;4 reps   inside // bars with UE support prn with LOB   Other Standing Exercises  crossovers 10' x 4 reps inside // bars with UE support prn; stepping behind 10' x 4 reps with UE support prn           PT Short Term Goals - 02/12/19 0908      PT SHORT TERM GOAL #1   Title  Improve Berg score from 45/56 to >/= 49/56 for reduced fall risk.    Baseline  45/56 on 01-15-19; 52/56 on 02-12-19    Time  4    Period  Weeks    Status  Achieved    Target Date  02/15/19      PT SHORT TERM GOAL #2   Title  Improve TUG score from 14.07 to </= 12.5 secs with no device for decr. fall risk.    Baseline  14.07 secs on 01-15-19; 11.84 secs on 02-12-19    Time  4    Period  Weeks    Status  Achieved    Target Date  02/15/19      PT SHORT TERM GOAL #3   Title  Incr. gait velocity from 2.44 ft/sec to >/= 2.8 ft/sec for incr. gait efficiency, with pt demonstrating minimal Rt trunk lean.    Baseline  13.47 secs = 2.44 ft/sec without device;  11.54secs = 2.84 ft/sec without device    Time  4    Period  Weeks    Status  Achieved    Target Date  02/15/19      PT SHORT TERM GOAL #4   Title  Incr. FGA score by at least 5 points from baseline to demonstrate improved balance with ambulation.    Baseline  to be completed next session    Time  4     Period  Weeks    Status  On-going    Target Date  02/15/19      PT SHORT TERM GOAL #5   Title  Independent in HEP for balance & strengthening exs.    Time  4    Period  Weeks    Status  Achieved    Target Date  02/15/19        PT Long Term Goals - 02/13/19 1554      PT LONG TERM GOAL #1   Title  Increase Berg score to >/= 52/56 for reduced fall risk.    Baseline  4/56 on 01-15-19    Time  8    Period  Weeks    Status  New      PT LONG TERM GOAL #2   Title  Improve TUG score to </= 10 secs without device to demonstrate high mobility.    Baseline  14.07 secs without device    Time  8    Period  Weeks    Status  New      PT LONG TERM GOAL #3   Title  Improve FGA score by at least 8 points to demonstrate improved balance with gait and for reduced fall risk.    Time  8    Period  Weeks    Status  New      PT LONG TERM GOAL #4   Title  Increase gait velocity to >/= 3.2 ft/sec for incr. gait efficiency with minimal postural deviations exhibited.    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    Time  8    Period  Weeks    Status  New      PT LONG TERM GOAL #6   Title  Pt will be independent in updated HEP as appropriate, including aquatic exercises.    Time  8    Period  Weeks    Status  New            Plan - 02/13/19 1555    Clinical Impression Statement  Pt has met STG's #1-3 and 5:  STG #4 (FGA) is ongoing as not assessed due to time constraint - to be completed next session; pt is progressing well with balance and gait.    Rehab Potential  Good    PT Frequency  2x / week    PT Duration  8 weeks    PT Treatment/Interventions  ADLs/Self Care Home Management;Aquatic Therapy;Gait training;Stair  training;Functional mobility training;Therapeutic activities;Therapeutic exercise;Balance training;Neuromuscular re-education;Patient/family education    PT Next Visit Plan  please recheck FGA (STG #4) - 10th visit progress note due next session    PT Home Exercise Plan  Access Code: [email or web address] URL: [email or web address] Date: 01/20/2019 Prepared by: Langley Gauss  Alford Highland       Patient will benefit from skilled therapeutic intervention in order to improve the following deficits and impairments:  Abnormal gait, Decreased activity tolerance, Decreased balance, Decreased strength, Decreased endurance, Impaired vision/preception, Decreased coordination  Visit Diagnosis: Unsteadiness on feet  Other abnormalities of gait and mobility     Problem List Patient Active Problem List   Diagnosis Date Noted  . CKD (chronic kidney disease) 08/28/2018  . Retinopathy of both eyes 08/28/2018  . Stroke due to embolism of left vertebral artery ([place]) 08/12/2018  . Stroke ([place]) 08/08/2018  . Diabetes mellitus ([place]) 07/25/2015  . Neck injury 06/22/2015  . Hearing loss in right ear 10/14/2011  . Agent orange exposure 10/14/2011  . ADD (attention deficit disorder) 10/14/2011  . Hyperlipidemia 10/14/2011  . BMI 28.0-28.9,adult 10/14/2011    Alda Lea, PT 02/13/2019, 3:57 PM  [facility] [address] [place], [place], [address] Phone: [phone]   Fax:  [phone]  Name: KINSTON MAGNAN MRN: [id number] Date of Birth: Jul 28, 1950

## 2019-02-16 ENCOUNTER — Encounter: Payer: Self-pay | Admitting: Physical Therapy

## 2019-02-16 ENCOUNTER — Ambulatory Visit: Payer: Medicare Other | Admitting: Physical Therapy

## 2019-02-16 ENCOUNTER — Other Ambulatory Visit: Payer: Self-pay

## 2019-02-16 DIAGNOSIS — R2681 Unsteadiness on feet: Secondary | ICD-10-CM | POA: Diagnosis not present

## 2019-02-16 DIAGNOSIS — R2689 Other abnormalities of gait and mobility: Secondary | ICD-10-CM

## 2019-02-16 NOTE — Therapy (Addendum)
[facility] [address] [place], [place], [address] Phone: [phone]   Fax:  [phone]  Physical Therapy Treatment & 10th Visit Progress Note  Patient Details  Name: Jeff [name] MRN: [id number] Date of Birth: 1951-05-09 Referring Provider (PT): Dr. Alysia Penna   Progress note reporting period:  01-15-19 - 02-16-19 Pt has met 5/5 STG's; FGA score has increased from 12/30 to 20/30; please see below for progress towards goals;  pt is progressing well with gait, balance, and mobility   Guido Sander, PT 02/19/19, 2:25 pm   Encounter Date: 02/16/2019  PT End of Session - 02/16/19 1244    Visit Number  10    Number of Visits  17    Date for PT Re-Evaluation  03/16/19    Authorization Type  [employer] Medicare    Authorization Time Period  01-15-19 - 04-15-19    PT Start Time  1150    PT Stop Time  1234    PT Time Calculation (min)  44 min    Activity Tolerance  Patient tolerated treatment well    Behavior During Therapy  [facility] for tasks assessed/performed       Past Medical History:  Diagnosis Date  . ADHD (attention deficit hyperactivity disorder)   . Diabetes mellitus   . Hyperlipidemia   . Hypertension     Past Surgical History:  Procedure Laterality Date  . NECK SURGERY    . SPINE SURGERY     Cervical spine x 2; Hardin Negus; [employer].    There were no vitals filed for this visit.  Subjective Assessment - 02/16/19 1244    Subjective  Denies any falls or changes.  Pt is happy with the progress he is making and can see a big difference.    Patient is accompained by:  Family member   spouse [name]   Pertinent History  Type 2 DM with diabetic retinopathy (did not drive prior to CVA); stage 3 kidney disease; h/o cervical fusion C5-7 (approx. 2012) and h/o neck injury in Dec. 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  No/denies         [facility] PT Assessment - 02/16/19 0001      Functional Gait  Assessment   Gait assessed   Yes    Gait Level Surface  Walks 20 ft in less than 7 sec but greater than 5.5 sec, uses assistive device, slower speed, mild gait deviations, or deviates 6-10 in outside of the 12 in walkway width.    Change in Gait Speed  Able to smoothly change walking speed without loss of balance or gait deviation. Deviate no more than 6 in outside of the 12 in walkway width.    Gait with Horizontal Head Turns  Performs head turns smoothly with slight change in gait velocity (eg, minor disruption to smooth gait path), deviates 6-10 in outside 12 in walkway width, or uses an assistive device.    Gait with Vertical Head Turns  Performs task with slight change in gait velocity (eg, minor disruption to smooth gait path), deviates 6 - 10 in outside 12 in walkway width or uses assistive device  Gait and Pivot Turn  Pivot turns safely within 3 sec and stops quickly with no loss of balance.    Step Over Obstacle  Is able to step over one shoe box (4.5 in total height) without changing gait speed. No evidence of imbalance.    Gait with Narrow Base of Support  Ambulates less than 4 steps heel to toe or cannot perform without assistance.    Gait with Eyes Closed  Walks 20 ft, slow speed, abnormal gait pattern, evidence for imbalance, deviates 10-15 in outside 12 in walkway width. Requires more than 9 sec to ambulate 20 ft.    Ambulating Backwards  Walks 20 ft, no assistive devices, good speed, no evidence for imbalance, normal gait    Steps  Alternating feet, must use rail.    Total Score  20    FGA comment:  Increased from 12/30 on initial assessment 01/20/19           [facility] Adult PT Treatment/Exercise - 02/16/19 0001       Ambulation/Gait   Ambulation/Gait  Yes    Ambulation/Gait Assistance  5: Supervision    Gait Comments  See FGA.  Pt continues to attempt to decrease BOS and point toes straight ahead during gait as instructed in previous session.        Balance   Balance Assessed  Yes      High Level Balance   High Level Balance Activities  Other (comment)    High Level Balance Comments  Tandem stance x 3 reps x 10 sec each direction, Tandem gait in // bars 8' x 4 reps.  Standing on blue foam beam for static balance, head turns/nods, eyes closed.  BOSU ball on blue surface x 1 minute with intermittent UE support.  BOSU on black surface with intermittent UE support and 10 minisquats.  Standing on blue foam then stepping onto/off 6" step then from foam to 6" step with tapping chair in front without stopping in between x 15 reps each side.         PT Short Term Goals - 02/16/19 1248      PT SHORT TERM GOAL #1   Title  Improve Berg score from 45/56 to >/= 49/56 for reduced fall risk.    Baseline  45/56 on 01-15-19; 52/56 on 02-12-19    Time  4    Period  Weeks    Status  Achieved    Target Date  02/15/19      PT SHORT TERM GOAL #2   Title  Improve TUG score from 14.07 to </= 12.5 secs with no device for decr. fall risk.    Baseline  14.07 secs on 01-15-19; 11.84 secs on 02-12-19    Time  4    Period  Weeks    Status  Achieved    Target Date  02/15/19      PT SHORT TERM GOAL #3   Title  Incr. gait velocity from 2.44 ft/sec to >/= 2.8 ft/sec for incr. gait efficiency, with pt demonstrating minimal Rt trunk lean.    Baseline  13.47 secs = 2.44 ft/sec without device;  11.54secs = 2.84 ft/sec without device    Time  4    Period  Weeks    Status  Achieved    Target Date  02/15/19      PT SHORT TERM GOAL #4   Title  Incr. FGA score by at least 5 points from baseline to demonstrate improved balance with ambulation.  Baseline  Scored 20/30 on 02/16/19    Time  4    Period  Weeks    Status  Achieved     Target Date  02/15/19      PT SHORT TERM GOAL #5   Title  Independent in HEP for balance & strengthening exs.    Time  4    Period  Weeks    Status  Achieved    Target Date  02/15/19        PT Long Term Goals - 02/13/19 1554      PT LONG TERM GOAL #1   Title  Increase Berg score to >/= 52/56 for reduced fall risk.    Baseline  4/56 on 01-15-19    Time  8    Period  Weeks    Status  New      PT LONG TERM GOAL #2   Title  Improve TUG score to </= 10 secs without device to demonstrate high mobility.    Baseline  14.07 secs without device    Time  8    Period  Weeks    Status  New      PT LONG TERM GOAL #3   Title  Improve FGA score by at least 8 points to demonstrate improved balance with gait and for reduced fall risk.    Time  8    Period  Weeks    Status  New      PT LONG TERM GOAL #4   Title  Increase gait velocity to >/= 3.2 ft/sec for incr. gait efficiency with minimal postural deviations exhibited.    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    Time  8    Period  Weeks    Status  New      PT LONG TERM GOAL #6   Title  Pt will be independent in updated HEP as appropriate, including aquatic exercises.    Time  8    Period  Weeks    Status  New            Plan - 02/16/19 1245    Clinical Impression Statement  Pt met STG#4 (scored 20 on FGA).  Pt making good progress and reports seeing a difference in his mobility as evident by recent assessments.  Pt with improved stepping strategies as well when does experience LOB.  Continue PT per POC.    Rehab Potential  Good    PT Frequency  2x / week    PT Duration  8 weeks    PT Treatment/Interventions  ADLs/Self Care Home Management;Aquatic  Therapy;Gait training;Stair training;Functional mobility training;Therapeutic activities;Therapeutic exercise;Balance training;Neuromuscular re-education;Patient/family education    PT Next Visit Plan  Continue balance, strengthening toward LTG's.    PT Home Exercise Plan  Access Code: [email or web address] URL: [email or web address] Date: 01/20/2019 Prepared by: Nita Sells       Patient will benefit from skilled therapeutic intervention in order to improve the following deficits and impairments:  Abnormal gait, Decreased activity tolerance, Decreased balance, Decreased strength, Decreased endurance, Impaired vision/preception, Decreased coordination  Visit Diagnosis:  Unsteadiness on feet  Other abnormalities of gait and mobility     Problem List Patient Active Problem List   Diagnosis Date Noted  . CKD (chronic kidney disease) 08/28/2018  . Retinopathy of both eyes 08/28/2018  . 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 Stroke ([place]) 08/08/2018  . Diabetes mellitus ([place]) 07/25/2015  . Neck injury 06/22/2015  . Hearing loss in right ear 10/14/2011  . Agent orange exposure 10/14/2011  . ADD (attention deficit disorder) 10/14/2011  . Hyperlipidemia 10/14/2011  . BMI 28.0-28.9,adult 10/14/2011    Narda Bonds, PTA [facility] [facility] 02/16/19 12:58 PM Phone: [phone] Fax: [facility] [address] [place], [place], [address] Phone: [phone]   Fax:  [phone]  Name: Jeff [name] MRN: [id number] Date of Birth: December 29, 1950

## 2019-02-17 ENCOUNTER — Ambulatory Visit: Payer: Medicare Other | Admitting: Physical Therapy

## 2019-02-17 ENCOUNTER — Encounter: Payer: Self-pay | Admitting: Physical Therapy

## 2019-02-17 DIAGNOSIS — R2689 Other abnormalities of gait and mobility: Secondary | ICD-10-CM

## 2019-02-17 DIAGNOSIS — R2681 Unsteadiness on feet: Secondary | ICD-10-CM | POA: Diagnosis not present

## 2019-02-17 NOTE — Therapy (Signed)
[facility] [address] [place], [place], [address] Phone: [phone]   Fax:  [phone]  Physical Therapy Treatment  Patient Details  Name: Jeff [name] MRN: [id number] Date of Birth: 06-08-1950 Referring Provider (PT): Dr. Alysia Penna   Encounter Date: 02/17/2019  PT End of Session - 02/17/19 1304    Visit Number  11    Number of Visits  17    Date for PT Re-Evaluation  03/16/19    Authorization Type  [employer] Medicare    Authorization Time Period  01-15-19 - 04-15-19    PT Start Time  0935    PT Stop Time  1015    PT Time Calculation (min)  40 min    Activity Tolerance  Patient tolerated treatment well    Behavior During Therapy  [facility] for tasks assessed/performed       Past Medical History:  Diagnosis Date  . ADHD (attention deficit hyperactivity disorder)   . Diabetes mellitus   . Hyperlipidemia   . Hypertension     Past Surgical History:  Procedure Laterality Date  . NECK SURGERY    . SPINE SURGERY     Cervical spine x 2; Jeff [name]; [employer].    There were no vitals filed for this visit.  Subjective Assessment - 02/17/19 0941    Subjective  Pt had to call EMS yesterday because his blood sugar dropped to 42 after lunch and his insulin.  Pt was light headed and started stuttering.  Blood sugars back up today to 255.    Patient is accompained by:  Family member   spouse [name]   Pertinent History  Type 2 DM with diabetic retinopathy (did not drive prior to CVA); stage 3 kidney disease; h/o cervical fusion C5-7 (approx. 2012) and h/o neck injury in Dec. 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  No/denies         [facility] Adult PT  Treatment/Exercise - 02/17/19 0001      Transfers   Transfers  Sit to Stand;Stand to Sit    Sit to Stand  5: Supervision    Sit to Stand Details  Other (comment)    Sit to Stand Details (indicate cue type and reason)  posterior lean onto heels when on Blue foam    Stand to Sit  5: Supervision    Number of Reps  10 reps;2 sets    Comments  standing on blue foam      High Level Balance   High Level Balance Activities  Other (comment)    High Level Balance Comments  Forward/Backward marching on blue foam mat in // bars x 6' x 6 reps.  Taps to 6" step x 10 reps then to 6", 12", 6" floor x 10 reps each side.  Tandem stance x 3 reps x 10 sec each direction, Tandem gait in // bars 8' x 4 reps.  Standing on red foam beam then switched to blue foam beam for static balance, head turns/nods, eyes closed.  Rocker board both directions for static stance, head turns/nods and eyes closed.  Needs intermittent UE support and frequent LOB with eyes closed.  Knee/Hip Exercises: Standing   Forward Step Up  Both;1 set;10 reps;Hand Hold: 1;Step Height: 6";Other (comment)   needing increase UE when stepping up with L leg leading   SLS  in // bars on non compliant and compliant surface x 4 reps x 10 seconds         PT Short Term Goals - 02/16/19 1248      PT SHORT TERM GOAL #1   Title  Improve Berg score from 45/56 to >/= 49/56 for reduced fall risk.    Baseline  45/56 on 01-15-19; 52/56 on 02-12-19    Time  4    Period  Weeks    Status  Achieved    Target Date  02/15/19      PT SHORT TERM GOAL #2   Title  Improve TUG score from 14.07 to </= 12.5 secs with no device for decr. fall risk.    Baseline  14.07 secs on 01-15-19; 11.84 secs on 02-12-19    Time  4    Period  Weeks    Status  Achieved    Target Date  02/15/19      PT SHORT TERM GOAL #3   Title  Incr. gait velocity from 2.44 ft/sec to >/= 2.8 ft/sec for incr. gait efficiency, with pt demonstrating minimal Rt trunk lean.    Baseline  13.47  secs = 2.44 ft/sec without device;  11.54secs = 2.84 ft/sec without device    Time  4    Period  Weeks    Status  Achieved    Target Date  02/15/19      PT SHORT TERM GOAL #4   Title  Incr. FGA score by at least 5 points from baseline to demonstrate improved balance with ambulation.    Baseline  Scored 20/30 on 02/16/19    Time  4    Period  Weeks    Status  Achieved    Target Date  02/15/19      PT SHORT TERM GOAL #5   Title  Independent in HEP for balance & strengthening exs.    Time  4    Period  Weeks    Status  Achieved    Target Date  02/15/19        PT Long Term Goals - 02/13/19 1554      PT LONG TERM GOAL #1   Title  Increase Berg score to >/= 52/56 for reduced fall risk.    Baseline  4/56 on 01-15-19    Time  8    Period  Weeks    Status  New      PT LONG TERM GOAL #2   Title  Improve TUG score to </= 10 secs without device to demonstrate high mobility.    Baseline  14.07 secs without device    Time  8    Period  Weeks    Status  New      PT LONG TERM GOAL #3   Title  Improve FGA score by at least 8 points to demonstrate improved balance with gait and for reduced fall risk.    Time  8    Period  Weeks    Status  New      PT LONG TERM GOAL #4   Title  Increase gait velocity to >/= 3.2 ft/sec for incr. gait efficiency with minimal postural deviations exhibited.    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    Time  8    Period  Weeks    Status  New      PT LONG TERM GOAL #6   Title  Pt will be independent in updated HEP as appropriate, including aquatic exercises.    Time  8    Period  Weeks    Status  New            Plan - 02/17/19 1304    Clinical Impression Statement  Pt  continues with decreased balance especially with eyes closed and continues with decreased endurance.  Needs rest breaks during session.  Continue PT per POC.    Rehab Potential  Good    PT Frequency  2x / week    PT Duration  8 weeks    PT Treatment/Interventions  ADLs/Self Care Home Management;Aquatic Therapy;Gait training;Stair training;Functional mobility training;Therapeutic activities;Therapeutic exercise;Balance training;Neuromuscular re-education;Patient/family education    PT Next Visit Plan  Continue balance, strengthening toward LTG's.    PT Home Exercise Plan  Access Code: [email or web address] URL: [email or web address] Date: 01/20/2019 Prepared by: Modena Morrowenise Carlita Whitcomb       Patient will benefit from skilled therapeutic intervention in order to improve the following deficits and impairments:  Abnormal gait, Decreased activity tolerance, Decreased balance, Decreased strength, Decreased endurance, Impaired vision/preception, Decreased coordination  Visit Diagnosis: Unsteadiness on feet  Other abnormalities of gait and mobility     Problem List Patient Active Problem List   Diagnosis Date Noted  . CKD (chronic kidney disease) 08/28/2018  . Retinopathy of both eyes 08/28/2018  . 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 Stroke (HCC) 08/08/2018  . Diabetes mellitus (HCC) 07/25/2015  . Neck injury 06/22/2015  . Hearing loss in right ear 10/14/2011  . Agent orange exposure 10/14/2011  . ADD (attention deficit disorder) 10/14/2011  . Hyperlipidemia 10/14/2011  . BMI 28.0-28.9,adult 10/14/2011    Newell Coralenise Terry Dasie Chancellor, PTA [facility] 02/17/19 1:12 PM Phone: [phone] Fax: [phone]   [facility] [address] [place], [place], [address] Phone: [phone]   Fax:  [phone]  Name: Leilani AbleJay W Domagalski MRN: [id number] Date of Birth: [birth year]

## 2019-02-18 ENCOUNTER — Ambulatory Visit: Payer: Medicare Other | Admitting: Physical Therapy

## 2019-02-23 ENCOUNTER — Encounter: Payer: Medicare Other | Attending: Physical Medicine & Rehabilitation | Admitting: Physical Medicine & Rehabilitation

## 2019-02-23 ENCOUNTER — Other Ambulatory Visit: Payer: Self-pay

## 2019-02-23 ENCOUNTER — Encounter: Payer: Self-pay | Admitting: Physical Medicine & Rehabilitation

## 2019-02-23 VITALS — BP 116/75 | HR 74 | Temp 97.5°F | Resp 14 | Ht 68.0 in | Wt 207.0 lb

## 2019-02-23 DIAGNOSIS — Z809 Family history of malignant neoplasm, unspecified: Secondary | ICD-10-CM | POA: Diagnosis not present

## 2019-02-23 DIAGNOSIS — Z8249 Family history of ischemic heart disease and other diseases of the circulatory system: Secondary | ICD-10-CM | POA: Insufficient documentation

## 2019-02-23 DIAGNOSIS — I10 Essential (primary) hypertension: Secondary | ICD-10-CM | POA: Diagnosis not present

## 2019-02-23 DIAGNOSIS — G463 Brain stem stroke syndrome: Secondary | ICD-10-CM | POA: Diagnosis not present

## 2019-02-23 DIAGNOSIS — Z833 Family history of diabetes mellitus: Secondary | ICD-10-CM | POA: Diagnosis not present

## 2019-02-23 DIAGNOSIS — E11319 Type 2 diabetes mellitus with unspecified diabetic retinopathy without macular edema: Secondary | ICD-10-CM | POA: Diagnosis not present

## 2019-02-23 DIAGNOSIS — R269 Unspecified abnormalities of gait and mobility: Secondary | ICD-10-CM | POA: Diagnosis not present

## 2019-02-23 DIAGNOSIS — I7774 Dissection of vertebral artery: Secondary | ICD-10-CM | POA: Diagnosis present

## 2019-02-23 DIAGNOSIS — I69398 Other sequelae of cerebral infarction: Secondary | ICD-10-CM | POA: Diagnosis not present

## 2019-02-23 NOTE — Progress Notes (Signed)
Subjective:    Patient ID: Jeff [name], male    DOB: 08-Mar-1951, 68 y.o.   MRN: [id number]  HPI  68 year old male with history of left posterior medullary infarct onset 08/08/2018.  The patient was evaluated by neurology, noted to have left vertebral artery stenosis, also question of dissection.  Patient was on inpatient rehabilitation from 3//03/24/2019 to 08/27/2018.  He was discharged at a supervision level.  He underwent further PT OT through [facility].  During his physical medicine and rehabilitation visit 10/22/2018 he was noted to be modified independent.  Because of continued balance problems he was referred to outpatient PT which has been ongoing since 01/15/2019.  Focusing on balance Patient with a personal goal of performing on regard March which is more or less a tandem gait Some anxiety regarding having another stroke.  [facility] has offered funding for this.    Patient has seen neuropsychology Dr. Sima Matas during his inpatient hospitalization.  We discussed my recommendation to follow-up with him.  Pain Inventory Average Pain 0 Pain Right Now 0 My pain is no pain  In the last 24 hours, has pain interfered with the following? General activity 0 Relation with others 0 Enjoyment of life 0 What TIME of day is your pain at its worst? no pain Sleep (in general) Good  Pain is worse with: no pain Pain improves with: no pain Relief from Meds: no pain  Mobility walk without assistance ability to climb steps?  yes do you drive?  no Do you have any goals in this area?  yes  Function retired  Neuro/Psych No problems in this area  Prior Studies Any changes since last visit?  no  Physicians involved in your care Any changes since last visit?  no   Family History  Problem Relation Age of Onset  . Heart disease Mother        CHF  . Diabetes Mother   . Heart disease Father   . Cancer Brother    Social History   Socioeconomic History  . Marital status: Single   Spouse name: Not on file  . Number of children: Not on file  . Years of education: Not on file  . Highest education level: Not on file  Occupational History  . Occupation: Press photographer  Social Needs  . Financial resource strain: Not on file  . Food insecurity    Worry: Not on file    Inability: Not on file  . Transportation needs    Medical: Not on file    Non-medical: Not on file  Tobacco Use  . Smoking status: Never Smoker  . Smokeless tobacco: Never Used  Substance and Sexual Activity  . Alcohol use: No    Alcohol/week: 0.0 standard drinks  . Drug use: No  . Sexual activity: Yes  Lifestyle  . Physical activity    Days per week: Not on file    Minutes per session: Not on file  . Stress: Not on file  Relationships  . Social Herbalist on phone: Not on file    Gets together: Not on file    Attends religious service: Not on file    Active member of club or organization: Not on file    Attends meetings of clubs or organizations: Not on file    Relationship status: Not on file  Other Topics Concern  . Not on file  Social History Narrative  . Not on file   Past Surgical  History:  Procedure Laterality Date  . NECK SURGERY    . SPINE SURGERY     Cervical spine x 2; [employer]; [employer].   Past Medical History:  Diagnosis Date  . ADHD (attention deficit hyperactivity disorder)   . Diabetes mellitus   . Hyperlipidemia   . Hypertension    Temp (!) 97.5 F (36.4 C)   Ht 5\' 8"  (1.727 m)   Wt 207 lb (93.9 kg)   BMI 31.47 kg/m   Opioid Risk Score:   Fall Risk Score:  `1  Depression screen PHQ 2/9  Depression screen [facility] 2/9 11/03/2018 10/30/2018 10/02/2018 12/30/2017 10/23/2017 10/02/2017 09/30/2017  Decreased Interest 0 0 0 0 0 0 0  Down, Depressed, Hopeless 0 0 0 0 0 0 0  PHQ - 2 Score 0 0 0 0 0 0 0  Altered sleeping - - 3 - - - -  Tired, decreased energy - - 3 - - - -  Change in appetite - - 0 - - - -  Feeling bad or failure about yourself   - - 0 - - - -  Trouble concentrating - - 0 - - - -  Moving slowly or fidgety/restless - - 0 - - - -  Suicidal thoughts - - 0 - - - -  PHQ-9 Score - - 6 - - - -    Review of Systems  Constitutional: Negative.   HENT: Negative.   Eyes: Negative.   Respiratory: Negative.   Cardiovascular: Negative.   Gastrointestinal: Negative.   Endocrine: Negative.   Genitourinary: Negative.   Musculoskeletal: Negative.   Skin: Negative.   Allergic/Immunologic: Negative.   Hematological: Negative.   Psychiatric/Behavioral: Negative.        Objective:   Physical Exam Vitals signs and nursing note reviewed.  Constitutional:      Appearance: Normal appearance. He is obese.  Eyes:     Extraocular Movements: Extraocular movements intact.     Conjunctiva/sclera: Conjunctivae normal.     Pupils: Pupils are equal, round, and reactive to light.  Musculoskeletal:     Comments: No pain with upper extremity range of motion no pain with lower extremity range of motion  Neurological:     Mental Status: He is alert and oriented to person, place, and time.     Cranial Nerves: No dysarthria.     Sensory: Sensation is intact.     Motor: Tremor present. No weakness.     Coordination: Rapid alternating movements normal.     Gait: Tandem walk abnormal.     Comments: Motor strength is 5/5 bilateral deltoid, bicep, tricep, grip, hip flexor, knee extensor, ankle dorsiflexor and plantar flexor mild intention tremor bilateral upper extremities No dysmetria finger-nose-finger Normal rapid alternating movements  Gait Patient is able to perform normal gait with normal base of support He is able to perform toe walking as well as heel walking He is unable to perform tandem gait No limb ataxia noted, Positive truncal ataxia   Psychiatric:        Mood and Affect: Mood normal.        Behavior: Behavior normal.           Assessment & Plan:  1.  Wallenberg syndrome with truncal ataxia.  Overall has  achieved modified independent level however the patient still working on higher-level balance for his community activities. He will continue outpatient PT.  He may benefit from aquatic therapy and he is exploring this. 2.  Anxiety related to adequate  condition.  His fear is of having another stroke.  He has had a dream where he was blind due to a stroke.  Will make referral for neuropsychology follow-up in office setting. Physical medicine rehab follow-up in 3 months

## 2019-02-23 NOTE — Patient Instructions (Signed)
Recommend Aquatic therapy

## 2019-02-24 ENCOUNTER — Ambulatory Visit: Payer: Medicare Other | Admitting: Physical Therapy

## 2019-02-26 ENCOUNTER — Other Ambulatory Visit: Payer: Self-pay

## 2019-02-26 ENCOUNTER — Ambulatory Visit: Payer: Medicare Other | Admitting: Physical Therapy

## 2019-02-26 ENCOUNTER — Encounter: Payer: Self-pay | Admitting: Physical Therapy

## 2019-02-26 DIAGNOSIS — I69351 Hemiplegia and hemiparesis following cerebral infarction affecting right dominant side: Secondary | ICD-10-CM

## 2019-02-26 DIAGNOSIS — R2681 Unsteadiness on feet: Secondary | ICD-10-CM | POA: Diagnosis not present

## 2019-02-26 DIAGNOSIS — R2689 Other abnormalities of gait and mobility: Secondary | ICD-10-CM

## 2019-02-26 NOTE — Patient Instructions (Signed)
Access Code: [email or web address]  URL: [email or web address]  Date: 02/26/2019  Prepared by: Nita Sells   Exercises  Standing Romberg to 1/2 Tandem Stance - 3 reps - 10 second hold - 1x daily - 7x weekly  Single Leg Heel Raise - 10 reps - 3 SECONDS hold - 1x daily - 7x weekly  Standing Single Leg Stance with Unilateral Counter Support - 3 reps - 1 sets - 10 sconds hold - 1x daily - 7x weekly  Standing with Head Rotation - 10 reps - 1 sets - 1x daily - 7x weekly  Standing with Head Nod - 10 reps - 1x daily - 7x weekly  Standing Balance with Eyes Closed - 4 reps - 10 seconds hold - 1x daily - 7x weekly

## 2019-02-26 NOTE — Therapy (Signed)
[facility] [address] [place], [place], [address] Phone: [phone]   Fax:  [phone]  Physical Therapy Treatment  Patient Details  Name: Jeff [name] MRN: [id number] Date of Birth: Dec 26, 1950 Referring Provider (PT): Dr. Claudette Laws   Encounter Date: 02/26/2019  PT End of Session - 02/26/19 1556    Visit Number  12    Number of Visits  17    Date for PT Re-Evaluation  03/16/19    Authorization Type  UHC Medicare    Authorization Time Period  01-15-19 - 04-15-19    PT Start Time  1435    PT Stop Time  1538    PT Time Calculation (min)  63 min    Activity Tolerance  Patient tolerated treatment well    Behavior During Therapy  [facility] for tasks assessed/performed       Past Medical History:  Diagnosis Date  . ADHD (attention deficit hyperactivity disorder)   . Diabetes mellitus   . Hyperlipidemia   . Hypertension     Past Surgical History:  Procedure Laterality Date  . NECK SURGERY    . SPINE SURGERY     Cervical spine x 2; [employer]; [employer].    There were no vitals filed for this visit.  Subjective Assessment - 02/26/19 1545    Subjective  Pt reports not having a great day.  MD from [place] called with results of MRI and reports vertebral artery occlusion.  Per pt, MD just to monitor.    Patient is accompained by:  Family member   spouse [name]   Pertinent History  Type 2 DM with diabetic retinopathy (did not drive prior to CVA); stage 3 kidney disease; h/o cervical fusion C5-7 (approx. 2012) and h/o neck injury in Dec. 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  No/denies            [facility] Adult PT Treatment/Exercise - 02/26/19 0001       Ambulation/Gait   Ambulation/Gait  Yes    Ambulation/Gait Assistance  5: Supervision    Ambulation/Gait Assistance Details  cues for posture, arm swing and foot positioning    Assistive device  None    Gait Pattern  Ataxic;Lateral trunk lean to left;Wide base of support;Trunk flexed    Ambulation Surface  Level;Indoor    Gait Comments  1218 ft in 6 MWT no device      High Level Balance   High Level Balance Activities  Other (comment)    High Level Balance Comments  SLS x 10 sec x 3 reps each side, tandem stance x 10 sec 3 reps each direction, Tandem gait 8' x 6 reps intermittent UE support, side stepping on blue foam 8' x 6 reps, rocker board both directions with head turns/nods and eyes closed, standing on blue foam for head turns/nods and eyes closed.      Self-Care   Self-Care  Other Self-Care Comments    Other Self-Care Comments   Pt asking appropriate questions concerning risks for recurrent stroke and stroke prevention along with balance issues related to his stroke.  Long discussion with pt on importance of exercise program  along with walking program for balance and endurance along with importance for diabetes and reducing stroke risk.  Pt reports currently doing walking but not consistently.  Encouraged to walk 15 minutes at least 3x/week and add 5 minutes after one week of consistent exercise with end goal being 30-45 minute walk at least 3x/week.  Also discussed importance of HEP to address his specific deficits related to balance.  Pt agrees and reports he has not been consistently doing his HEP.  Discussed aquatics therapy with Kerry Fort, PT, present and pt scheduled to begin aquatic therapy next week.       Exercises   Exercises  Ankle      Ankle Exercises: Seated   Other Seated Ankle Exercises  bil Dorsiflexion with red band x 10 reps x 2 and bil ankle eversion red band 10 reps x 2             PT Education - 02/26/19 1555    Education Details  CVA Risk factors,  Importance of HEP and walking program, how to initiate walking program, reviewed HEP, aquatic PT    Person(s) Educated  Patient;Spouse    Methods  Explanation;Demonstration;Handout    Comprehension  Verbalized understanding;Returned demonstration       PT Short Term Goals - 02/16/19 1248      PT SHORT TERM GOAL #1   Title  Improve Berg score from 45/56 to >/= 49/56 for reduced fall risk.    Baseline  45/56 on 01-15-19; 52/56 on 02-12-19    Time  4    Period  Weeks    Status  Achieved    Target Date  02/15/19      PT SHORT TERM GOAL #2   Title  Improve TUG score from 14.07 to </= 12.5 secs with no device for decr. fall risk.    Baseline  14.07 secs on 01-15-19; 11.84 secs on 02-12-19    Time  4    Period  Weeks    Status  Achieved    Target Date  02/15/19      PT SHORT TERM GOAL #3   Title  Incr. gait velocity from 2.44 ft/sec to >/= 2.8 ft/sec for incr. gait efficiency, with pt demonstrating minimal Rt trunk lean.    Baseline  13.47 secs = 2.44 ft/sec without device;  11.54secs = 2.84 ft/sec without device    Time  4    Period  Weeks    Status  Achieved    Target Date  02/15/19      PT SHORT TERM GOAL #4   Title  Incr. FGA score by at least 5 points from baseline to demonstrate improved balance with ambulation.    Baseline  Scored 20/30 on 02/16/19    Time  4    Period  Weeks    Status  Achieved    Target Date  02/15/19      PT SHORT TERM GOAL #5   Title  Independent in HEP for balance & strengthening exs.    Time  4    Period  Weeks    Status  Achieved    Target Date  02/15/19        PT Long Term Goals - 02/13/19 1554      PT LONG TERM GOAL #1   Title  Increase Berg score to >/= 52/56 for reduced fall risk.    Baseline  4/56 on 01-15-19    Time  8    Period  Weeks    Status  New      PT LONG TERM GOAL #2   Title  Improve TUG score to </= 10 secs without device to demonstrate high mobility.    Baseline  14.07 secs without device    Time  8    Period  Weeks     Status  New      PT LONG TERM GOAL #3   Title  Improve FGA score by at least 8 points to demonstrate improved balance with gait and for reduced fall risk.    Time  8    Period  Weeks    Status  New      PT LONG TERM GOAL #4   Title  Increase gait velocity to >/= 3.2 ft/sec for incr. gait efficiency with minimal postural deviations exhibited.    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    Time  8    Period  Weeks    Status  New      PT LONG TERM GOAL #6   Title  Pt will be independent in updated HEP as appropriate, including aquatic exercises.    Time  8    Period  Weeks    Status  New            Plan - 02/26/19 1557    Clinical Impression Statement  Skilled session focused on continued balance, strengthening and education.  Stressed importance of consistent exercise program including HEP and walking.  Per Kerry FortSuzanne Dilday, PT, pt scheduled for aquatic PT sessions beginning next week.  Continue PT per POC.    Rehab Potential  Good    PT Frequency  2x / week    PT Duration  8 weeks    PT Treatment/Interventions  ADLs/Self Care Home Management;Aquatic Therapy;Gait training;Stair training;Functional mobility training;Therapeutic activities;Therapeutic exercise;Balance training;Neuromuscular re-education;Patient/family education    PT Next Visit Plan  Continue balance, strengthening toward LTG's.    PT Home Exercise Plan  Access Code: [email or web address] URL: [email or web address] Date: 01/20/2019 Prepared by: Modena Morrowenise Robertson       Patient will benefit from skilled therapeutic intervention in order to improve the following deficits and impairments:  Abnormal gait, Decreased activity tolerance, Decreased  balance, Decreased strength, Decreased endurance, Impaired vision/preception, Decreased coordination  Visit Diagnosis: Unsteadiness on feet  Other abnormalities of gait and mobility  Hemiplegia and hemiparesis following cerebral infarction affecting right dominant side [facility](HCC)     Problem List Patient Active Problem List   Diagnosis Date Noted  . CKD (chronic kidney disease) 08/28/2018  . Retinopathy of both eyes 08/28/2018  . 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 Stroke (HCC) 08/08/2018  . Diabetes mellitus (HCC) 07/25/2015  . Neck injury 06/22/2015  . Hearing loss in right ear 10/14/2011  . Agent orange exposure 10/14/2011  . ADD (attention deficit disorder) 10/14/2011  . Hyperlipidemia 10/14/2011  . BMI 28.0-28.9,adult 10/14/2011    Newell Coralenise Terry Robertson, PTA [facility] 02/26/19 4:01 PM Phone: [phone] Fax: [phone]   [facility] [address]  [place], [place], [address] Phone: [phone]   Fax:  [phone]  Name: Jeff [name] MRN: [id number] Date of Birth: April 18, 1951

## 2019-02-27 ENCOUNTER — Ambulatory Visit: Payer: Medicare Other | Admitting: Physical Therapy

## 2019-02-27 DIAGNOSIS — R2681 Unsteadiness on feet: Secondary | ICD-10-CM | POA: Diagnosis not present

## 2019-02-27 DIAGNOSIS — I69351 Hemiplegia and hemiparesis following cerebral infarction affecting right dominant side: Secondary | ICD-10-CM

## 2019-02-27 DIAGNOSIS — R2689 Other abnormalities of gait and mobility: Secondary | ICD-10-CM

## 2019-02-27 NOTE — Therapy (Signed)
[facility] [address] [place], [place], [address] Phone: [phone]   Fax:  [phone]  Physical Therapy Treatment  Patient Details  Name: Jeff [name] MRN: [id number] Date of Birth: 1950/06/11 Referring Provider (PT): Dr. Claudette Laws   Encounter Date: 02/27/2019  PT End of Session - 02/27/19 1638    Visit Number  13    Number of Visits  17    Date for PT Re-Evaluation  03/16/19    Authorization Type  UHC Medicare    Authorization Time Period  01-15-19 - 04-15-19    PT Start Time  1400    PT Stop Time  1435   Pt fatigued and requested to end session early today   PT Time Calculation (min)  35 min    Activity Tolerance  Patient tolerated treatment well    Behavior During Therapy  [facility] for tasks assessed/performed       Past Medical History:  Diagnosis Date  . ADHD (attention deficit hyperactivity disorder)   . Diabetes mellitus   . Hyperlipidemia   . Hypertension     Past Surgical History:  Procedure Laterality Date  . NECK SURGERY    . SPINE SURGERY     Cervical spine x 2; [employer]; [employer].    There were no vitals filed for this visit.  Subjective Assessment - 02/27/19 1631    Subjective  Denies falls or changes.  Legs and ankles were a little sore after last visit.    Patient is accompained by:  Family member   spouse Jeff [name]   Pertinent History  Type 2 DM with diabetic retinopathy (did not drive prior to CVA); stage 3 kidney disease; h/o cervical fusion C5-7 (approx. 2012) and h/o neck injury in Dec. 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  No/denies         [facility] Adult PT Treatment/Exercise - 02/27/19 0001      Transfers   Transfers  Sit to Stand;Stand to Sit    Sit to Stand  5: Supervision    Stand to Sit  5: Supervision    Number of Reps  10 reps;2 sets   standing on blue foam     High Level Balance   High Level Balance Activities  Marching forwards;Other (comment)   tandem walking 8' x 8 reps     Exercises   Exercises  Knee/Hip;Ankle      Knee/Hip Exercises: Stretches   Other Knee/Hip Stretches  Supine for knee to chest stretch x 30 sec x 2 reps      Knee/Hip Exercises: Standing   Heel Raises  Both;2 sets;10 reps    Knee Flexion  Strengthening;Both;2 sets;10 reps    Hip Flexion  Stengthening;Both;2 sets;10 reps;Knee straight    Hip Abduction  Stengthening;Both;2 sets;10 reps;Knee straight    SLS  2 reps x 10 sec each side with intermittent UE support    Other Standing Knee Exercises  All exercises performed with 4# weight bil LE      Knee/Hip Exercises: Supine   Bridges  Both;2 sets;10 reps      Ankle Exercises: Standing   Heel Raises  Both;10 reps;2 seconds   then repeated x 10 each of single leg   Toe Raise  10 reps;2 seconds         PT Short Term Goals - 02/16/19 1248      PT SHORT TERM GOAL #1   Title  Improve Berg score from 45/56 to >/= 49/56 for reduced fall risk.    Baseline  45/56 on 01-15-19; 52/56 on 02-12-19    Time  4    Period  Weeks    Status  Achieved    Target Date  02/15/19      PT SHORT TERM GOAL #2   Title  Improve TUG score from 14.07 to </= 12.5 secs with no device for decr. fall risk.    Baseline  14.07 secs on 01-15-19; 11.84 secs on 02-12-19    Time  4    Period  Weeks    Status  Achieved    Target Date  02/15/19      PT SHORT TERM GOAL #3   Title  Incr. gait velocity from 2.44 ft/sec to >/= 2.8 ft/sec for incr. gait efficiency, with pt demonstrating minimal Rt trunk lean.    Baseline  13.47 secs = 2.44 ft/sec without device;  11.54secs = 2.84 ft/sec without device    Time  4    Period  Weeks    Status  Achieved    Target Date  02/15/19      PT SHORT  TERM GOAL #4   Title  Incr. FGA score by at least 5 points from baseline to demonstrate improved balance with ambulation.    Baseline  Scored 20/30 on 02/16/19    Time  4    Period  Weeks    Status  Achieved    Target Date  02/15/19      PT SHORT TERM GOAL #5   Title  Independent in HEP for balance & strengthening exs.    Time  4    Period  Weeks    Status  Achieved    Target Date  02/15/19        PT Long Term Goals - 02/13/19 1554      PT LONG TERM GOAL #1   Title  Increase Berg score to >/= 52/56 for reduced fall risk.    Baseline  4/56 on 01-15-19    Time  8    Period  Weeks    Status  New      PT LONG TERM GOAL #2   Title  Improve TUG score to </= 10 secs without device to demonstrate high mobility.    Baseline  14.07 secs without device    Time  8    Period  Weeks    Status  New      PT LONG TERM GOAL #3   Title  Improve FGA score by at least 8 points to demonstrate improved balance with gait and for reduced fall risk.    Time  8    Period  Weeks    Status  New      PT LONG TERM GOAL #4   Title  Increase gait velocity to >/= 3.2 ft/sec for incr. gait efficiency with minimal postural deviations exhibited.    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    Time  8    Period  Weeks    Status  New      PT LONG TERM GOAL #6   Title  Pt will be independent in updated HEP as appropriate, including aquatic exercises.    Time  8    Period  Weeks    Status  New              Patient will benefit from skilled therapeutic intervention in order to improve the following deficits and impairments:     Visit Diagnosis: Unsteadiness on feet  Other abnormalities of gait and mobility  Hemiplegia  and hemiparesis following cerebral infarction affecting right dominant side [facility])     Problem List Patient Active Problem List   Diagnosis Date Noted  . CKD (chronic kidney disease) 08/28/2018  . Retinopathy of both eyes 08/28/2018  . Stroke due to embolism of left vertebral artery ([place]) 08/12/2018  . Stroke ([place]) 08/08/2018  . Diabetes mellitus ([place]) 07/25/2015  . Neck injury 06/22/2015  . Hearing loss in right ear 10/14/2011  . Agent orange exposure 10/14/2011  . ADD (attention deficit disorder) 10/14/2011  . Hyperlipidemia 10/14/2011  . BMI 28.0-28.9,adult 10/14/2011    Narda Bonds, PTA [place] 02/27/19 4:40 PM Phone: [phone] Fax: [place] [address] [place], [place], [address] Phone: [phone]   Fax:  [phone]  Name: Jeff [name] MRN: [id number] Date of Birth: [birth year]

## 2019-03-03 ENCOUNTER — Ambulatory Visit: Payer: Medicare Other | Admitting: Physical Therapy

## 2019-03-03 ENCOUNTER — Other Ambulatory Visit: Payer: Self-pay

## 2019-03-03 DIAGNOSIS — I69351 Hemiplegia and hemiparesis following cerebral infarction affecting right dominant side: Secondary | ICD-10-CM

## 2019-03-03 DIAGNOSIS — R293 Abnormal posture: Secondary | ICD-10-CM

## 2019-03-03 DIAGNOSIS — R2681 Unsteadiness on feet: Secondary | ICD-10-CM

## 2019-03-03 DIAGNOSIS — R2689 Other abnormalities of gait and mobility: Secondary | ICD-10-CM

## 2019-03-03 NOTE — Therapy (Signed)
[facility] [address] [place], [place], [address] Phone: [phone]   Fax:  [phone]  Physical Therapy Treatment  Patient Details  Name: Jeff [name] MRN: [id number] Date of Birth: Mar 04, 1951 Referring Provider (Jeff [name]): Dr. Alysia Penna   Encounter Date: 03/03/2019  Jeff [name] End of Session - 03/03/19 1303    Visit Number  14    Number of Visits  17    Date for Jeff [name] Re-Evaluation  03/16/19    Authorization Type  UHC Medicare    Authorization Time Period  01-15-19 - 04-15-19    Jeff [name] Start Time  1152    Jeff [name] Stop Time  1230    Jeff [name] Time Calculation (min)  38 min    Activity Tolerance  Patient tolerated treatment well    Behavior During Therapy  [facility] for tasks assessed/performed       Past Medical History:  Diagnosis Date  . ADHD (attention deficit hyperactivity disorder)   . Diabetes mellitus   . Hyperlipidemia   . Hypertension     Past Surgical History:  Procedure Laterality Date  . NECK SURGERY    . SPINE SURGERY     Cervical spine x 2; Hardin Negus; [employer].    There were no vitals filed for this visit.  Subjective Assessment - 03/03/19 1157    Subjective  "I was worn out" after last session.  Received clearance from eye doctor to drive again.    Patient is accompained by:  Family member   spouse Jeff [name]   Pertinent History  Type 2 DM with diabetic retinopathy (did not drive prior to CVA); stage 3 kidney disease; h/o cervical fusion C5-7 (approx. 2012) and h/o neck injury in Dec. 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  No/denies        [facility] Adult Jeff [name] Treatment/Exercise - 03/03/19 0001      Transfers   Transfers  Sit to Stand;Stand to Sit    Sit  to Stand  5: Supervision    Sit to Stand Details  Verbal cues for technique   tends to lean posterior   Stand to Sit  5: Supervision    Number of Reps  10 reps;2 sets   once on non compliant and once on compliant   Transfer Cueing  no UE support      Posture/Postural Control   Posture/Postural Control  Postural limitations    Postural Limitations  Rounded Shoulders;Forward head    Posture Comments  cues during through out session for upright posture      High Level Balance   High Level Balance Activities  Other (comment)    High Level Balance Comments  Standing on red balance beam with EC x 10 sec x 3, head turns x 20, head nods x 20 and attempted EC with Head turns.  Jeff [name] requiring frequent UE support to maintain balance on red beam.  Standing with eyes closed on noncompliant surface 10 sec x 3, head nods x20, head turns x 20 then EC with head turns x 20 and head nods x 20.  Transitioned to blue foam standing EC 10 sec x 3, head nods x 20,  head turns x 20 and EC for head turns x 20 and head nods x 20.  Standing for zoom ball x 1 minute then standing on blue foam for the same.  Squat side stepping 8' x 6 reps with green theraband the side step, squat and upright 8' x 6 with green theraband      Ankle Exercises: Stretches   Gastroc Stretch  2 reps;30 seconds   both sides              Jeff [name] Short Term Goals - 02/16/19 1248      Jeff [name] SHORT TERM GOAL #1   Title  Improve Berg score from 45/56 to >/= 49/56 for reduced fall risk.    Baseline  45/56 on 01-15-19; 52/56 on 02-12-19    Time  4    Period  Weeks    Status  Achieved    Target Date  02/15/19      Jeff [name] SHORT TERM GOAL #2   Title  Improve TUG score from 14.07 to </= 12.5 secs with no device for decr. fall risk.    Baseline  14.07 secs on 01-15-19; 11.84 secs on 02-12-19    Time  4    Period  Weeks    Status  Achieved    Target Date  02/15/19      Jeff [name] SHORT TERM GOAL #3   Title  Incr. gait velocity from 2.44 ft/sec to >/= 2.8  ft/sec for incr. gait efficiency, with Jeff [name] demonstrating minimal Rt trunk lean.    Baseline  13.47 secs = 2.44 ft/sec without device;  11.54secs = 2.84 ft/sec without device    Time  4    Period  Weeks    Status  Achieved    Target Date  02/15/19      Jeff [name] SHORT TERM GOAL #4   Title  Incr. FGA score by at least 5 points from baseline to demonstrate improved balance with ambulation.    Baseline  Scored 20/30 on 02/16/19    Time  4    Period  Weeks    Status  Achieved    Target Date  02/15/19      Jeff [name] SHORT TERM GOAL #5   Title  Independent in HEP for balance & strengthening exs.    Time  4    Period  Weeks    Status  Achieved    Target Date  02/15/19        Jeff [name] Long Term Goals - 02/13/19 1554      Jeff [name] LONG TERM GOAL #1   Title  Increase Berg score to >/= 52/56 for reduced fall risk.    Baseline  4/56 on 01-15-19    Time  8    Period  Weeks    Status  New      Jeff [name] LONG TERM GOAL #2   Title  Improve TUG score to </= 10 secs without device to demonstrate high mobility.    Baseline  14.07 secs without device    Time  8    Period  Weeks    Status  New      Jeff [name] LONG TERM GOAL #3   Title  Improve FGA score by at least 8 points to demonstrate improved balance with gait and for reduced fall risk.    Time  8    Period  Weeks    Status  New      Jeff [name] LONG TERM GOAL #4   Title  Increase  gait velocity to >/= 3.2 ft/sec for incr. gait efficiency with minimal postural deviations exhibited.    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    Time  8    Period  Weeks    Status  New      Jeff [name] LONG TERM GOAL #6   Title  Jeff [name] will be independent in updated HEP as appropriate, including aquatic exercises.    Time  8    Period   Weeks    Status  New            Plan - 03/03/19 1304    Clinical Impression Statement  Jeff [name] continues with decreased balance and endurance.  Scheduled for aquatics Jeff [name] next session.  Continues to have difficulty with eyes closed on compliant surfaces.  Continue Jeff [name] per POC.    Rehab Potential  Good    Jeff [name] Frequency  2x / week    Jeff [name] Duration  8 weeks    Jeff [name] Treatment/Interventions  ADLs/Self Care Home Management;Aquatic Therapy;Gait training;Stair training;Functional mobility training;Therapeutic activities;Therapeutic exercise;Balance training;Neuromuscular re-education;Patient/family education    Jeff [name] Next Visit Plan  Continue balance, strengthening toward LTG's.  Jeff [name], Jeff [name], to renew for additional POC next week    Jeff [name] Home Exercise Plan  Access Code: [email or web address] URL: [email or web address] Date: 01/20/2019 Prepared by: Modena Morrowenise        Patient will benefit from skilled therapeutic intervention in order to improve the following deficits and impairments:  Abnormal gait, Decreased activity tolerance, Decreased balance, Decreased strength, Decreased endurance, Impaired vision/preception, Decreased coordination  Visit Diagnosis: Unsteadiness on feet  Other abnormalities of gait and mobility  Hemiplegia and hemiparesis following cerebral infarction affecting right dominant side (HCC)  Abnormal posture     Problem List Patient Active Problem List   Diagnosis Date Noted  . CKD (chronic kidney disease) 08/28/2018  . Retinopathy of both eyes 08/28/2018  . 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 Stroke ([facility]) 08/08/2018  . Diabetes mellitus (H[facility]) 07/25/2015  . Neck injury 06/22/2015  . Hearing loss in right ear 10/14/2011  . Agent orange exposure 10/14/2011  . ADD (attention deficit disorder) 10/14/2011  . Hyperlipidemia 10/14/2011  . BMI 28.0-28.9,adult 10/14/2011    Jeff [name] , PTA [facility] 03/03/19 1:08 PM Phone: [phone] Fax: [phone]   [facility] [address] [place], [place], [address] Phone: [phone]   Fax:  [phone]  Name: Jeff AbleJay W [name] MRN: [id number] Date of Birth: Feb 09, 1951

## 2019-03-04 ENCOUNTER — Encounter: Payer: Self-pay | Admitting: Physical Therapy

## 2019-03-04 ENCOUNTER — Ambulatory Visit: Payer: Medicare Other | Admitting: Physical Therapy

## 2019-03-04 DIAGNOSIS — R2681 Unsteadiness on feet: Secondary | ICD-10-CM | POA: Diagnosis not present

## 2019-03-04 DIAGNOSIS — R2689 Other abnormalities of gait and mobility: Secondary | ICD-10-CM

## 2019-03-04 NOTE — Therapy (Signed)
[facility] [address] [place], [place], [address] Phone: [phone]   Fax:  [phone]  Physical Therapy Treatment  Patient Details  Name: Jeff [name] MRN: [id number] Date of Birth: 1950-10-02 Referring Provider (PT): Dr. Alysia Penna   Encounter Date: 03/04/2019  PT End of Session - 03/04/19 2048    Visit Number  15    Number of Visits  17    Date for PT Re-Evaluation  03/16/19    Authorization Type  UHC Medicare    Authorization Time Period  01-15-19 - 04-15-19    PT Start Time  1500    PT Stop Time  1545    PT Time Calculation (min)  45 min    Activity Tolerance  Patient tolerated treatment well    Behavior During Therapy  [facility] for tasks assessed/performed       Past Medical History:  Diagnosis Date  . ADHD (attention deficit hyperactivity disorder)   . Diabetes mellitus   . Hyperlipidemia   . Hypertension     Past Surgical History:  Procedure Laterality Date  . NECK SURGERY    . SPINE SURGERY     Cervical spine x 2; Hardin Negus; [employer].    There were no vitals filed for this visit.  Subjective Assessment - 03/04/19 2046    Subjective  Pt presents for 1st aquatic therapy session at [facility] - accompanied by his wife; pt reports no changes since previous PT session    Patient is accompained by:  Family member   spouse [name]   Pertinent History  Type 2 DM with diabetic retinopathy (did not drive prior to CVA); stage 3 kidney disease; h/o cervical fusion C5-7 (approx. 2012) and h/o neck injury in Dec. 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  No/denies       Aquatic therapy at [facility] - pool temp. 87.4 degrees   Patient seen for aquatic  therapy today.  Treatment took place in water 3.5-4 feet deep depending upon activity.  Pt entered and exited  the pool via step negotiation with use of hand rails with supervision.  Pt performed water walking for dynamic gait training forwards x 1 rep across pool (66m); added horizontal head turns on 2nd rep with SBA for safety but pt had no major LOB with this activity; pt performed backwards amb. 5m x 1 rep, sideways amb. X 2 reps  Balance activities included standing with narrow BOS with EO x 15 secs, EC x 10  Secs; added standing with EC with horizontal head turns x 5 reps, vertical head turns with EC 5 reps  Marching forward across pool (attempted 2 sec hold for improved SLS on stance leg); marching backward 75m across pool  Pt performed 1 rep of amb. With narrow BOS - attempting to keep feet on royal blue tile on pool floor; progressed to partial tandem stance on  Blue tile with CGA for recovery of LOB  Pt stood on each leg - made circles with one leg clockwise 5 reps, then counterclockwise 5 reps - performed with each leg with UE support on pool edge prn with CGA  Pt performed braiding  8335m x 2 reps across pool with CGA prn for LOB  Pt performed fast amb. 3935m across pool with abrupt stop/go for perturbations and for balance recovery  Pt peformed Ai Chi postures - 5 reps only due to c/o fatigue - soothing and enclosing; pt had difficulty maintaining balance with soothing posture  Pt requires buoyancy of water for support with balance activities for safety with reduced fall risk in aquatic environment; viscosity of water needed for strengthening RLE ; current of water provided perturnbations with dynamic gait training and balance exercises.                        PT Short Term Goals - 03/04/19 2051      PT SHORT TERM GOAL #1   Title  Improve Berg score from 45/56 to >/= 49/56 for reduced fall risk.    Baseline  45/56 on 01-15-19; 52/56 on 02-12-19    Time  4     Period  Weeks    Status  Achieved    Target Date  02/15/19      PT SHORT TERM GOAL #2   Title  Improve TUG score from 14.07 to </= 12.5 secs with no device for decr. fall risk.    Baseline  14.07 secs on 01-15-19; 11.84 secs on 02-12-19    Time  4    Period  Weeks    Status  Achieved    Target Date  02/15/19      PT SHORT TERM GOAL #3   Title  Incr. gait velocity from 2.44 ft/sec to >/= 2.8 ft/sec for incr. gait efficiency, with pt demonstrating minimal Rt trunk lean.    Baseline  13.47 secs = 2.44 ft/sec without device;  11.54secs = 2.84 ft/sec without device    Time  4    Period  Weeks    Status  Achieved    Target Date  02/15/19      PT SHORT TERM GOAL #4   Title  Incr. FGA score by at least 5 points from baseline to demonstrate improved balance with ambulation.    Baseline  Scored 20/30 on 02/16/19    Time  4    Period  Weeks    Status  Achieved    Target Date  02/15/19      PT SHORT TERM GOAL #5   Title  Independent in HEP for balance & strengthening exs.    Time  4    Period  Weeks    Status  Achieved    Target Date  02/15/19        PT Long Term Goals - 03/04/19 2051      PT LONG TERM GOAL #1   Title  Increase Berg score to >/= 52/56 for reduced fall risk.    Baseline  4/56 on 01-15-19    Time  8    Period  Weeks    Status  New      PT LONG TERM GOAL #2   Title  Improve TUG score to </= 10 secs without device to demonstrate high mobility.    Baseline  14.07 secs without device    Time  8    Period  Weeks    Status  New      PT LONG TERM GOAL #3   Title  Improve FGA score by at least 8 points to demonstrate improved balance with gait and for reduced fall risk.    Time  8    Period  Weeks    Status  New      PT LONG TERM GOAL #4   Title  Increase gait velocity to >/= 3.2 ft/sec for incr. gait efficiency with minimal postural deviations exhibited.    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    Time  8    Period  Weeks    Status  New      PT LONG TERM GOAL #6   Title  Pt will be independent in updated HEP as appropriate, including aquatic exercises.    Time  8    Period  Weeks    Status  New            Plan - 03/04/19 2048    Clinical Impression Statement  Aquatic therapy session focused on dynamic standing balance, high level static standing balance with increased vestibular input and dynamic gait activities in water for resistance for strengthening as well as for perturbations provided by water current.  Pt reported fatigue at end of session.  Pt also had some difficulty maintaining balance with Ai Chi postures indicative of decreased core stabilization.    Personal Factors and Comorbidities  Profession    Rehab Potential  Good    PT Frequency  2x / week    PT Duration  8 weeks    PT Treatment/Interventions  ADLs/Self Care Home Management;Aquatic Therapy;Gait training;Stair training;Functional mobility training;Therapeutic activities;Therapeutic exercise;Balance training;Neuromuscular re-education;Patient/family education    PT Next Visit Plan  Continue balance, strengthening toward LTG's.  Kerry Fort, PT, to renew for additional POC next week    PT Home Exercise Plan  Access Code: [email or web address] URL: [email or web address] Date: 01/20/2019 Prepared by: Modena Morrow       Patient will benefit from skilled therapeutic intervention in order to improve the following deficits and impairments:  Abnormal gait, Decreased activity tolerance, Decreased balance, Decreased strength, Decreased endurance, Impaired vision/preception, Decreased coordination  Visit Diagnosis: Unsteadiness on feet  Other abnormalities of gait and mobility     Problem  List Patient Active Problem List   Diagnosis Date Noted  . CKD (chronic kidney disease) 08/28/2018  . Retinopathy of both eyes 08/28/2018  . 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 Stroke ([facility]) 08/08/2018  . Diabetes mellitus (HCC) 07/25/2015  . Neck injury 06/22/2015  . Hearing loss in right ear 10/14/2011  . Agent orange exposure 10/14/2011  . ADD (attention deficit disorder) 10/14/2011  . Hyperlipidemia 10/14/2011  . BMI 28.0-28.9,adult 10/14/2011    Jaymison Luber, Donavan Burnet, PT, ATRIC 03/04/2019, 8:53 PM  [facility] [address] [place], [place], [address] Phone: [phone]   Fax:  [phone]  Name: Jeff [name] MRN: [id number] Date of Birth: 10-23-1950

## 2019-03-05 ENCOUNTER — Ambulatory Visit: Payer: Medicare Other | Admitting: Physical Therapy

## 2019-03-09 ENCOUNTER — Other Ambulatory Visit: Payer: Self-pay

## 2019-03-09 ENCOUNTER — Ambulatory Visit: Payer: Medicare Other | Attending: Physical Medicine & Rehabilitation | Admitting: Physical Therapy

## 2019-03-09 DIAGNOSIS — R2681 Unsteadiness on feet: Secondary | ICD-10-CM | POA: Insufficient documentation

## 2019-03-09 DIAGNOSIS — R2689 Other abnormalities of gait and mobility: Secondary | ICD-10-CM | POA: Diagnosis present

## 2019-03-10 ENCOUNTER — Encounter: Payer: Self-pay | Admitting: Physical Therapy

## 2019-03-10 NOTE — Therapy (Signed)
[facility] [address] [place], [place], [address] Phone: [phone]   Fax:  [phone]  Physical Therapy Treatment  Patient Details  Name: Jeff [name] MRN: [id number] Date of Birth: 12-25-1950 Referring Provider (PT): Dr. Alysia Penna   Encounter Date: 03/09/2019  PT End of Session - 03/10/19 1506    Visit Number  16    Number of Visits  17    Date for PT Re-Evaluation  03/16/19    Authorization Type  [employer] Medicare    Authorization Time Period  01-15-19 - 04-15-19    PT Start Time  1017    PT Stop Time  1101    PT Time Calculation (min)  44 min    Activity Tolerance  Patient tolerated treatment well    Behavior During Therapy  [facility] for tasks assessed/performed       Past Medical History:  Diagnosis Date  . ADHD (attention deficit hyperactivity disorder)   . Diabetes mellitus   . Hyperlipidemia   . Hypertension     Past Surgical History:  Procedure Laterality Date  . NECK SURGERY    . SPINE SURGERY     Cervical spine x 2; Hardin Negus; [employer].    There were no vitals filed for this visit.  Subjective Assessment - 03/10/19 1458    Subjective  Pt reports he is doing better with balance and walking; states the pool session wore him out last week    Patient is accompained by:  Family member    Pertinent History  Type 2 DM with diabetic retinopathy (did not drive prior to CVA); stage 3 kidney disease; h/o cervical fusion C5-7 (approx. 2012) and h/o neck injury in Dec. 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  No/denies         [facility] PT Assessment - 03/10/19 0001      Berg Balance Test   Sit to Stand  Able to stand without using hands and  stabilize independently    Standing Unsupported  Able to stand safely 2 minutes    Sitting with Back Unsupported but Feet Supported on Floor or Stool  Able to sit safely and securely 2 minutes    Stand to Sit  Sits safely with minimal use of hands    Transfers  Able to transfer safely, minor use of hands    Standing Unsupported with Eyes Closed  Able to stand 10 seconds safely    Standing Unsupported with Feet Together  Able to place feet together independently and stand 1 minute safely    From Standing, Reach Forward with Outstretched Arm  Can reach confidently >25 cm (10")    From Standing Position, Pick up Object from Floor  Able to pick up shoe, needs supervision    From Standing Position, Turn to Look Behind Over each Shoulder  Looks behind from both sides and weight shifts well    Turn 360 Degrees  Able to turn 360 degrees safely in 4 seconds or less    Standing Unsupported, Alternately Place Feet on Step/Stool  Able to stand independently and safely and complete 8 steps in 20 seconds  Standing Unsupported, One Foot in Front  Able to plae foot ahead of the other independently and hold 30 seconds    Standing on One Leg  Able to lift leg independently and hold 5-10 seconds    Total Score  53      Functional Gait  Assessment   Gait assessed   Yes    Gait Level Surface  Walks 20 ft in less than 5.5 sec, no assistive devices, good speed, no evidence for imbalance, normal gait pattern, deviates no more than 6 in outside of the 12 in walkway width.    Change in Gait Speed  Able to smoothly change walking speed without loss of balance or gait deviation. Deviate no more than 6 in outside of the 12 in walkway width.    Gait with Horizontal Head Turns  Performs head turns smoothly with slight change in gait velocity (eg, minor disruption to smooth gait path), deviates 6-10 in outside 12 in walkway width, or uses an assistive device.    Gait with Vertical Head Turns  Performs task with slight change  in gait velocity (eg, minor disruption to smooth gait path), deviates 6 - 10 in outside 12 in walkway width or uses assistive device    Gait and Pivot Turn  Pivot turns safely within 3 sec and stops quickly with no loss of balance.    Step Over Obstacle  Is able to step over one shoe box (4.5 in total height) without changing gait speed. No evidence of imbalance.    Gait with Narrow Base of Support  Ambulates 4-7 steps.    Gait with Eyes Closed  Walks 20 ft, uses assistive device, slower speed, mild gait deviations, deviates 6-10 in outside 12 in walkway width. Ambulates 20 ft in less than 9 sec but greater than 7 sec.    Ambulating Backwards  Walks 20 ft, no assistive devices, good speed, no evidence for imbalance, normal gait    Steps  Alternating feet, no rail.    Total Score  24                   [facility] Adult PT Treatment/Exercise - 03/10/19 0001      Transfers   Transfers  Sit to Stand    Sit to Stand  5: Supervision    Stand to Sit  5: Supervision    Number of Reps  Other reps (comment)   3   Transfer Cueing  no UE support               PT Short Term Goals - 03/10/19 1509      PT SHORT TERM GOAL #1   Title  Improve Berg score from 45/56 to >/= 49/56 for reduced fall risk.    Baseline  45/56 on 01-15-19; 52/56 on 02-12-19    Time  4    Period  Weeks    Status  Achieved    Target Date  02/15/19      PT SHORT TERM GOAL #2   Title  Improve TUG score from 14.07 to </= 12.5 secs with no device for decr. fall risk.    Baseline  14.07 secs on 01-15-19; 11.84 secs on 02-12-19    Time  4    Period  Weeks    Status  Achieved    Target Date  02/15/19      PT SHORT TERM GOAL #3   Title  Incr. gait velocity from 2.44 ft/sec to >/=  2.8 ft/sec for incr. gait efficiency, with pt demonstrating minimal Rt trunk lean.    Baseline  13.47 secs = 2.44 ft/sec without device;  11.54secs = 2.84 ft/sec without device    Time  4    Period  Weeks    Status  Achieved    Target  Date  02/15/19      PT SHORT TERM GOAL #4   Title  Incr. FGA score by at least 5 points from baseline to demonstrate improved balance with ambulation.    Baseline  Scored 20/30 on 02/16/19    Time  4    Period  Weeks    Status  Achieved    Target Date  02/15/19      PT SHORT TERM GOAL #5   Title  Independent in HEP for balance & strengthening exs.    Time  4    Period  Weeks    Status  Achieved    Target Date  02/15/19        PT Long Term Goals - 03/09/19 1024      PT LONG TERM GOAL #1   Title  Increase Berg score to >/= 52/56 for reduced fall risk.    Baseline  45/56 on 01-15-19;  52/56 on 02-12-19;  53/56 on 03-09-19    Time  8    Period  Weeks    Status  Achieved      PT LONG TERM GOAL #2   Title  Improve TUG score to </= 10 secs without device to demonstrate high mobility.    Baseline  14.07 secs without device;  7.31 secs on 03-09-19    Time  8    Period  Weeks    Status  Achieved      PT LONG TERM GOAL #3   Title  Improve FGA score by at least 8 points to demonstrate improved balance with gait and for reduced fall risk.    Baseline  24/30 on 03-09-19:  initial score 12/30 on 01-20-19; 20/30 on 02-16-19    Time  8    Period  Weeks    Status  Achieved      PT LONG TERM GOAL #4   Title  Increase gait velocity to >/= 3.2 ft/sec for incr. gait efficiency with minimal postural deviations exhibited.    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    Baseline  balance with tandem walking is improving but pt not able to perform this activity safely as of current time - goal ongoing    Time  8    Period  Weeks    Status  On-going      PT LONG TERM GOAL #6    Title  Pt will be independent in updated HEP as appropriate, including aquatic exercises.    Time  8    Period  Weeks    Status  On-going            Plan - 03/10/19 1531    Clinical Impression Statement  Pt has met LTG's #  1-4 and goals # 5 and 6 remain ongoing as these goals are not fully met but pt is improving in tandem stance and HEP has been initiated with aquatic therapy initiated on 03-04-19.  Pt's FGA score has increased from 12/30 on 01-20-19 to 24/30 on 03-09-19.  Pt is progressing well with balance and gait.    Personal Factors and Comorbidities  Profession    Rehab Potential  Good    PT Frequency  2x / week    PT Duration  8 weeks    PT Treatment/Interventions  ADLs/Self Care Home Management;Aquatic Therapy;Gait training;Stair training;Functional mobility training;Therapeutic activities;Therapeutic exercise;Balance training;Neuromuscular re-education;Patient/family education    PT Next Visit Plan  Continue balance, strengthening toward LTG's.  Guido Sander, PT, to renew for additional POC next week    PT Home Exercise Plan  Access Code: [email or web address] URL: [email or web address] Date: 01/20/2019 Prepared by: Nita Sells       Patient will benefit from skilled therapeutic intervention in order to improve the following deficits and impairments:  Abnormal gait, Decreased activity tolerance, Decreased balance, Decreased strength, Decreased endurance, Impaired vision/preception, Decreased coordination  Visit Diagnosis: Unsteadiness on feet  Other abnormalities of gait and mobility     Problem List Patient Active Problem List   Diagnosis Date Noted  . CKD (chronic kidney disease) 08/28/2018  . Retinopathy of both eyes 08/28/2018  . Stroke due to embolism of left vertebral artery ([place]) 08/12/2018  . Stroke ([place]) 08/08/2018  . Diabetes mellitus ([place]) 07/25/2015  . Neck injury 06/22/2015  . Hearing loss in right ear 10/14/2011  . Agent orange exposure  10/14/2011  . ADD (attention deficit disorder) 10/14/2011  . Hyperlipidemia 10/14/2011  . BMI 28.0-28.9,adult 10/14/2011    [place], PT 03/10/2019, 3:35 PM  [facility] [address] [place], [place], [address] Phone: [phone]   Fax:  [phone]  Name: KAEDAN RICHERT MRN: [id number] Date of Birth: [birth year]

## 2019-03-11 ENCOUNTER — Other Ambulatory Visit: Payer: Self-pay

## 2019-03-11 ENCOUNTER — Ambulatory Visit: Payer: Medicare Other | Admitting: Physical Therapy

## 2019-03-11 DIAGNOSIS — R2689 Other abnormalities of gait and mobility: Secondary | ICD-10-CM

## 2019-03-11 DIAGNOSIS — R2681 Unsteadiness on feet: Secondary | ICD-10-CM

## 2019-03-12 ENCOUNTER — Ambulatory Visit: Payer: Medicare Other | Admitting: Physical Therapy

## 2019-03-12 ENCOUNTER — Encounter: Payer: Self-pay | Admitting: Physical Therapy

## 2019-03-12 NOTE — Therapy (Signed)
[facility] [address] [place], [place], [address] Phone: [phone]   Fax:  [phone]  Physical Therapy Treatment  Patient Details  Name: Jeff [name] MRN: [id number] Date of Birth: 12-24-1950 Referring Provider (PT): Dr. Claudette Laws   Encounter Date: 03/11/2019  PT End of Session - 03/12/19 1919    Visit Number  17    Number of Visits  25    Date for PT Re-Evaluation  04/17/19    Authorization Type  UHC Medicare    Authorization Time Period  01-15-19 - 04-15-19;  03-10-19 - 06-03-19    PT Start Time  1328    PT Stop Time  1400   aquatic therapy session ended early due to c/o bil. ankle pain in pool   PT Time Calculation (min)  32 min    Activity Tolerance  Patient limited by pain    Behavior During Therapy  [facility] for tasks assessed/performed       Past Medical History:  Diagnosis Date  . ADHD (attention deficit hyperactivity disorder)   . Diabetes mellitus   . Hyperlipidemia   . Hypertension     Past Surgical History:  Procedure Laterality Date  . NECK SURGERY    . SPINE SURGERY     Cervical spine x 2; [employer]; [employer].    There were no vitals filed for this visit.  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  No/denies          Aquatic therapy at [facility] - pool temp. 87.4 degrees   Patient seen for aquatic therapy today.  Treatment took place in water 3.5-4 feet deep depending upon activity.  Pt entered and exited  the pool via step negotiation with use of hand rails with supervision.  Pt performed water walking for dynamic  gait training forwards x 1 rep across pool (4m);   Marching forward across pool (attempted 2 sec hold for improved SLS on stance leg); marching backward 67m across pool - pt began complaining of bil. Ankle pain  Attempted jogging 55m across pool but pt reported more pain with this activity - pt had to stop after approx. 63m across pool due to ankle pain/discomfort  Pt performed 1 rep of amb. With narrow BOS - attempting to keep feet on royal blue tile on pool floor; progressed to partial tandem stance on  Blue tile with CGA for recovery of LOB - pt continued to c/o pain even with low impact activities so this was discontinued and pt went to bench in pool to sit to do bil. Dorsiflexor stretches, in attempt to alleviate or reduce the ankle pain  Pt performed crossovers front approx. 57m across pool, then performed crossovers with other leg for 48m across pool; pt continued to report increased pain and stated he was not able to continue with aquatic therapy at today's visit   Pt requires buoyancy of water for support with balance activities for safety with reduced fall risk in aquatic environment; viscosity of water needed for  strengthening  RLE   Session ended early - after approx. 28" due to c/o bil. Ankle pain                                     PT Short Term Goals - 03/10/19 1509      PT SHORT TERM GOAL #1   Title  Improve Berg score from 45/56 to >/= 49/56 for reduced fall risk.    Baseline  45/56 on 01-15-19; 52/56 on 02-12-19    Time  4    Period  Weeks    Status  Achieved    Target Date  02/15/19      PT SHORT TERM GOAL #2   Title  Improve TUG score from 14.07 to </= 12.5 secs with no device for decr. fall risk.    Baseline  14.07 secs on 01-15-19; 11.84 secs on 02-12-19    Time  4    Period  Weeks    Status  Achieved    Target Date  02/15/19      PT SHORT TERM GOAL #3   Title  Incr. gait velocity from 2.44 ft/sec to >/= 2.8 ft/sec for incr.  gait efficiency, with pt demonstrating minimal Rt trunk lean.    Baseline  13.47 secs = 2.44 ft/sec without device;  11.54secs = 2.84 ft/sec without device    Time  4    Period  Weeks    Status  Achieved    Target Date  02/15/19      PT SHORT TERM GOAL #4   Title  Incr. FGA score by at least 5 points from baseline to demonstrate improved balance with ambulation.    Baseline  Scored 20/30 on 02/16/19    Time  4    Period  Weeks    Status  Achieved    Target Date  02/15/19      PT SHORT TERM GOAL #5   Title  Independent in HEP for balance & strengthening exs.    Time  4    Period  Weeks    Status  Achieved    Target Date  02/15/19        PT Long Term Goals - 03/12/19 1932      PT LONG TERM GOAL #1   Title  Increase Berg score to >/= 52/56 for reduced fall risk. UPDATED LTG  Berg score >/= 54/56    Baseline  45/56 on 01-15-19;  52/56 on 02-12-19;  53/56 on 03-09-19    Time  4    Period  Weeks    Status  Achieved      PT LONG TERM GOAL #2   Title  Improve TUG score to </= 10 secs without device to demonstrate high mobility.    Baseline  14.07 secs without device;  7.31 secs on 03-09-19    Time  8    Period  Weeks    Status  Achieved      PT LONG TERM GOAL #3   Title  Improve FGA score by at least 8 points to demonstrate improved balance with gait and for reduced fall risk.  UPDATED LTG:  Increase FGA score from 24/30 to >/= 28/30 for increased safety with ambulation.    Baseline  24/30 on 03-09-19:  initial score 12/30 on 01-20-19; 20/30 on 02-16-19    Time  8    Period  Weeks  Status  Achieved      PT LONG TERM GOAL #4   Title  Increase gait velocity to >/= 3.2 ft/sec for incr. gait efficiency with minimal postural deviations exhibited.    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    Baseline  balance with tandem walking is improving but pt not able to perform this activity safely as of current time - goal ongoing    Time  8    Period  Weeks    Status  On-going      PT LONG TERM GOAL #6   Title  Pt will be independent in updated HEP as appropriate, including aquatic exercises.    Time  8    Period  Weeks    Status  On-going            Plan - 03/12/19 1922    Clinical Impression Statement  Pt's participation in aquatic therapy was limited by c/o anterior ankle pain bil. LE's with Lt ankle pain > Rt ankle pain.  Stretching of dorsiflexors was done to alleviate pain but did not relieve the pain experienced with weight bearing.  Plyometrics were discontinued and water walking was substituted but pt c/o pain with water walking after approx 15" into session;  aquatic therapy session was ended early after approx 25" due to c/o pain    PT Frequency  2x / week    PT Duration  4 weeks   renewal completed on 03-10-19 for 4 additional weeks   PT Treatment/Interventions  ADLs/Self Care Home Management;Aquatic Therapy;Gait training;Stair training;Functional mobility training;Therapeutic activities;Therapeutic exercise;Balance training;Neuromuscular re-education;Patient/family education    PT Next Visit Plan  Continue high level balance, gait, and strengthening    PT Home Exercise Plan  Access Code: [email or web address] URL: [email or web address] Date: 01/20/2019 Prepared by: Nita Sells    Consulted and Agree with Plan of Care  Patient    Family Member Consulted  wife [name]       Patient will benefit from skilled therapeutic intervention in order to improve the following deficits and impairments:     Visit Diagnosis: Other abnormalities of gait and mobility  Unsteadiness on feet     Problem List Patient Active Problem List    Diagnosis Date Noted  . CKD (chronic kidney disease) 08/28/2018  . Retinopathy of both eyes 08/28/2018  . Stroke due to embolism of left vertebral artery ([place]) 08/12/2018  . Stroke ([place]) 08/08/2018  . Diabetes mellitus ([place]) 07/25/2015  . Neck injury 06/22/2015  . Hearing loss in right ear 10/14/2011  . Agent orange exposure 10/14/2011  . ADD (attention deficit disorder) 10/14/2011  . Hyperlipidemia 10/14/2011  . BMI 28.0-28.9,adult 10/14/2011    Lucerito Rosinski, [place], PT, ATRIC 03/12/2019, 7:34 PM  [place] [address] [place], [place], [address] Phone: [phone]   Fax:  [phone]  Name: SHAQUAN MISSEY MRN: [id number] Date of Birth: 28-Aug-1950

## 2019-03-18 ENCOUNTER — Ambulatory Visit (INDEPENDENT_AMBULATORY_CARE_PROVIDER_SITE_OTHER): Payer: Medicare Other | Admitting: Adult Health Nurse Practitioner

## 2019-03-18 ENCOUNTER — Other Ambulatory Visit: Payer: Self-pay

## 2019-03-18 DIAGNOSIS — Z23 Encounter for immunization: Secondary | ICD-10-CM | POA: Diagnosis not present

## 2019-03-18 NOTE — Progress Notes (Signed)
Pt came in the office under nursing schedule and received high dose flu shot today.

## 2019-03-20 ENCOUNTER — Encounter

## 2019-03-23 ENCOUNTER — Ambulatory Visit: Payer: Medicare Other | Admitting: Physical Therapy

## 2019-03-30 ENCOUNTER — Other Ambulatory Visit: Payer: Self-pay

## 2019-03-30 ENCOUNTER — Ambulatory Visit: Payer: Medicare Other | Admitting: Physical Therapy

## 2019-03-30 DIAGNOSIS — R2681 Unsteadiness on feet: Secondary | ICD-10-CM | POA: Diagnosis not present

## 2019-03-30 DIAGNOSIS — R2689 Other abnormalities of gait and mobility: Secondary | ICD-10-CM

## 2019-03-31 ENCOUNTER — Encounter: Payer: Self-pay | Admitting: Physical Therapy

## 2019-03-31 NOTE — Therapy (Signed)
[facility] [address] [place], [place], [address] Phone: [phone]   Fax:  [phone]  Physical Therapy Treatment  Patient Details  Name: Jeff [name] MRN: [id number] Date of Birth: 01-Aug-1950 Referring Provider (PT): Dr. Claudette LawsAndrew Kirsteins   Encounter Date: 03/30/2019  PT End of Session - 03/31/19 2025    Visit Number  18    Number of Visits  25    Date for PT Re-Evaluation  04/17/19    Authorization Type  UHC Medicare    Authorization Time Period  01-15-19 - 04-15-19;  03-10-19 - 06-03-19    PT Start Time  0815   late arrival by therapist due to traffic delay   PT Stop Time  0845    PT Time Calculation (min)  30 min    Activity Tolerance  Patient tolerated treatment well    Behavior During Therapy  [facility] for tasks assessed/performed       Past Medical History:  Diagnosis Date  . ADHD (attention deficit hyperactivity disorder)   . Diabetes mellitus   . Hyperlipidemia   . Hypertension     Past Surgical History:  Procedure Laterality Date  . NECK SURGERY    . SPINE SURGERY     Cervical spine x 2; [employer]; [employer].    There were no vitals filed for this visit.  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  Pt reports foot pain experienced in previous pool therapy session 2 wks ago cleared up.    Patient is accompained by:  Family member    Pertinent History  Type 2 DM with diabetic retinopathy (did not drive prior to CVA); stage 3 kidney disease; h/o cervical fusion C5-7 (approx. 2012) and h/o neck injury in Dec. 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  No/denies                        [facility] Adult PT Treatment/Exercise - 03/31/19 0001      Transfers   Transfers  Sit to Stand    Sit to Stand  5: Supervision    Stand to Sit  5: Supervision    Number of Reps  Other reps (comment)   5 reps from mat   Transfer Cueing  no UE support          Balance Exercises - 03/31/19 2021      Balance Exercises: Standing   Tandem Stance  Eyes open;Intermittent upper extremity support;1 rep;15 secs   inside // bars   Rockerboard  Anterior/posterior;EO;10 reps;Intermittent UE support    Step Ups  Forward;6 inch;Intermittent UE support   10 reps each leg   Balance Beam  standing perpendicular on blue foam balance beam - head turns slowly 5 reps side to side    Tandem Gait  Forward;4 reps;Intermittent upper extremity support   inside // bars   Other Standing Exercises  crossovers front 10' x 2 reps, stepping behind 10' x 2 reps inside // bars with UE support prn      NeuroRe-ed;  Tap ups to 1st, 2nd step, 3rd step with  RLE 5 reps each; then alternate tap ups to each step (1st, 2nd & 3rd) w Without UE support  Standing on Bosu inside // bars - weight shifts anterior/posterior and laterally 5 reps each; SLS activity - foot placed in middle of Bosu base Moved other leg forward/back and out/in to fascilitate improved SLS on stance leg and improved trunk control  Squats x 5 reps on Bosu with UE support on // bars prn    PT Short Term Goals - 03/31/19 2029      PT SHORT TERM GOAL #1   Title  Improve Berg score from 45/56 to >/= 49/56 for reduced fall risk.    Baseline  45/56 on 01-15-19; 52/56 on 02-12-19    Time  4    Period  Weeks    Status  Achieved    Target Date  02/15/19      PT SHORT TERM GOAL #2   Title  Improve TUG score from 14.07 to </= 12.5 secs with no device for decr. fall risk.    Baseline  14.07 secs on 01-15-19; 11.84 secs on 02-12-19    Time  4    Period  Weeks    Status  Achieved    Target Date  02/15/19      PT SHORT  TERM GOAL #3   Title  Incr. gait velocity from 2.44 ft/sec to >/= 2.8 ft/sec for incr. gait efficiency, with pt demonstrating minimal Rt trunk lean.    Baseline  13.47 secs = 2.44 ft/sec without device;  11.54secs = 2.84 ft/sec without device    Time  4    Period  Weeks    Status  Achieved    Target Date  02/15/19      PT SHORT TERM GOAL #4   Title  Incr. FGA score by at least 5 points from baseline to demonstrate improved balance with ambulation.    Baseline  Scored 20/30 on 02/16/19    Time  4    Period  Weeks    Status  Achieved    Target Date  02/15/19      PT SHORT TERM GOAL #5   Title  Independent in HEP for balance & strengthening exs.    Time  4    Period  Weeks    Status  Achieved    Target Date  02/15/19        PT Long Term Goals - 03/31/19 2030      PT LONG TERM GOAL #1   Title  Increase Berg score to >/= 52/56 for reduced fall risk. UPDATED LTG  Berg score >/= 54/56    Baseline  45/56 on 01-15-19;  52/56 on 02-12-19;  53/56 on 03-09-19    Time  4    Period  Weeks    Status  Achieved    Target Date  04/24/19      PT LONG TERM GOAL #2   Title  Improve TUG score to </= 10 secs without device to demonstrate high mobility.    Baseline  14.07 secs without device;  7.31 secs on 03-09-19    Time  8    Period  Weeks    Status  Achieved      PT LONG TERM GOAL #3   Title  Improve FGA score by at least 8 points to demonstrate improved balance with gait and for reduced fall risk.  UPDATED LTG:  Increase FGA score from 24/30 to >/= 28/30 for increased safety  with ambulation.    Baseline  24/30 on 03-09-19:  initial score 12/30 on 01-20-19; 20/30 on 02-16-19    Time  8    Period  Weeks    Status  Achieved    Target Date  04/24/19      PT LONG TERM GOAL #4   Title  Increase gait velocity to >/= 3.2 ft/sec for incr. gait efficiency with minimal postural deviations exhibited.    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    Baseline  balance with tandem walking is improving but pt not able to perform this activity safely as of current time - goal ongoing    Time  8    Period  Weeks    Status  On-going    Target Date  04/24/19      PT LONG TERM GOAL #6   Title  Pt will be independent in updated HEP as appropriate, including aquatic exercises.    Time  8    Period  Weeks    Status  On-going    Target Date  04/24/19            Plan - 03/31/19 2027    Clinical Impression Statement  Pt improving with maintaining balance in tandem stance; pt needed UE support on // bar 3 occurrences on initial rep and then only 1 LOB on 2nd rep in which pt had to touch // bar for regaining balance.  Pt is progressing towards goals - extend LOS 1 week due to pt being out of town for funeral last week.    Comorbidities  h/o cervical fusion (C5-7) approx. 2012 with neck injury in Dec. 2016 (pt slipped off edge of bed):  CKD stage 3;  diabetic retinopathy bil. eyes; DM type 2:  HTN    PT Frequency  2x / week    PT Duration  4 weeks   renewal completed on 03-10-19 for 4 additional weeks   PT Treatment/Interventions  ADLs/Self Care Home Management;Aquatic Therapy;Gait training;Stair training;Functional mobility training;Therapeutic activities;Therapeutic exercise;Balance training;Neuromuscular re-education;Patient/family education    PT Next Visit Plan  Continue high level balance, gait, and strengthening    PT Home Exercise Plan  Access Code: [email or web address] URL: [email or web address] Date: 01/20/2019 Prepared by: Nita Sells    Consulted and Agree with Plan of Care  Patient    Family Member Consulted  wife [name]       Patient will benefit from skilled  therapeutic intervention in order to improve the following deficits and impairments:     Visit Diagnosis: Other abnormalities of gait and mobility  Unsteadiness on feet     Problem List Patient Active Problem List   Diagnosis Date Noted  . CKD (chronic kidney disease) 08/28/2018  . Retinopathy of both eyes 08/28/2018  . Stroke due to embolism of left vertebral artery ([place]) 08/12/2018  . Stroke ([place]) 08/08/2018  . Diabetes mellitus ([place]) 07/25/2015  . Neck injury 06/22/2015  . Hearing loss in right ear 10/14/2011  . Agent orange exposure 10/14/2011  . ADD (attention deficit  disorder) 10/14/2011  . Hyperlipidemia 10/14/2011  . BMI 28.0-28.9,adult 10/14/2011    DildayDonavan Burnet, PT 03/31/2019, 8:32 PM  [place] [facility] [address] [place], [place], [address] Phone: [phone]   Fax:  [phone]  Name: Jeff [name] MRN: [id number] Date of Birth: [birth year]

## 2019-04-01 ENCOUNTER — Other Ambulatory Visit: Payer: Self-pay

## 2019-04-01 ENCOUNTER — Encounter: Payer: Self-pay | Admitting: Physical Therapy

## 2019-04-01 ENCOUNTER — Ambulatory Visit: Payer: Medicare Other | Admitting: Physical Therapy

## 2019-04-01 DIAGNOSIS — R2681 Unsteadiness on feet: Secondary | ICD-10-CM

## 2019-04-01 DIAGNOSIS — R2689 Other abnormalities of gait and mobility: Secondary | ICD-10-CM

## 2019-04-01 NOTE — Therapy (Signed)
[facility] [address] [place], [place], [address] Phone: [phone]   Fax:  [phone]  Physical Therapy Treatment  Patient Details  Name: Jeff [name] MRN: [id number] Date of Birth: Sep 22, 1950 Referring Provider (PT): Dr. Alysia Penna   Encounter Date: 04/01/2019  PT End of Session - 04/01/19 1800    Visit Number  19    Number of Visits  25    Date for PT Re-Evaluation  04/17/19    Authorization Type  UHC Medicare    Authorization Time Period  01-15-19 - 04-15-19;  03-10-19 - 06-03-19    PT Start Time  1335    PT Stop Time  1416    PT Time Calculation (min)  41 min    Activity Tolerance  Patient tolerated treatment well    Behavior During Therapy  [facility] for tasks assessed/performed       Past Medical History:  Diagnosis Date  . ADHD (attention deficit hyperactivity disorder)   . Diabetes mellitus   . Hyperlipidemia   . Hypertension     Past Surgical History:  Procedure Laterality Date  . NECK SURGERY    . SPINE SURGERY     Cervical spine x 2; Hardin Negus; [employer].    There were no vitals filed for this visit.  Subjective Assessment - 04/01/19 1758    Subjective  Pt denies any changes.  States has not had the pain experienced in last pool session.    Patient is accompained by:  Family member    Pertinent History  Type 2 DM with diabetic retinopathy (did not drive prior to CVA); stage 3 kidney disease; h/o cervical fusion C5-7 (approx. 2012) and h/o neck injury in Dec. 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  No/denies       Aquatic therapy at [facility] - pool temp. 87.2 degrees  Patient seen for aquatic therapy today.  Treatment took place in water 3.5-4 feet deep depending upon activity.  Pt entered and exited  the pool via step negotiation  with use of handrails with supervision.   Pt performed water walking for dynamic gait training forwards x 2 rep across pool (42m); pt performed backwards amb. 75m x 2 rep, sideways amb. X 2 reps   Balance activities included standing with narrow BOS with EO x 15 secs, EC x 10 Secs; added standing with EC with horizontal head turns x 10 reps, vertical head turns with EC 10 reps   Marching forward across pool 1m;Sidestep with squat 48m x 2 reps.  Pt with difficulty maintaining balance.  SLS x 10 sec x 2 reps  Pt performed 1 rep of amb. With tandem gait.    Pt performed bil LE hip flexion, hip extension, hamstring curl, hip abduction, hip flexion straight into hip extension with knee flexed-all with ankle cuffs and x 20 reps each.  Min assist of edge of pool for balance.  Sit<>stand x 10 from pool seat without UE assist then x 10 RLE only and x 10 LLE only.  Pt required min assist of side of pool for support with single leg.    Pt performed Ai Chi postures - 15 reps each of soothing and enclosing; pt had difficulty maintaining balance with these postures.  Performed 10 reps of  balancing posture with intermittent UE support and cues for technique.   Pt requires buoyancy of water for support with balance activities for safety with reduced fall risk in aquatic environment; viscosity of water needed for strengthening RLE ; current of water provided perturbations with dynamic gait training and balance exercises.        PT Short Term Goals - 03/31/19 2029      PT SHORT TERM GOAL #1   Title  Improve Berg score from 45/56 to >/= 49/56 for reduced fall risk.    Baseline  45/56 on 01-15-19; 52/56 on 02-12-19    Time  4    Period  Weeks    Status  Achieved    Target Date  02/15/19      PT SHORT TERM GOAL #2   Title  Improve TUG score from 14.07 to </= 12.5 secs with no device for decr. fall risk.    Baseline  14.07 secs on 01-15-19; 11.84 secs on 02-12-19    Time  4    Period  Weeks    Status   Achieved    Target Date  02/15/19      PT SHORT TERM GOAL #3   Title  Incr. gait velocity from 2.44 ft/sec to >/= 2.8 ft/sec for incr. gait efficiency, with pt demonstrating minimal Rt trunk lean.    Baseline  13.47 secs = 2.44 ft/sec without device;  11.54secs = 2.84 ft/sec without device    Time  4    Period  Weeks    Status  Achieved    Target Date  02/15/19      PT SHORT TERM GOAL #4   Title  Incr. FGA score by at least 5 points from baseline to demonstrate improved balance with ambulation.    Baseline  Scored 20/30 on 02/16/19    Time  4    Period  Weeks    Status  Achieved    Target Date  02/15/19      PT SHORT TERM GOAL #5   Title  Independent in HEP for balance & strengthening exs.    Time  4    Period  Weeks    Status  Achieved    Target Date  02/15/19        PT Long Term Goals - 03/31/19 2030      PT LONG TERM GOAL #1   Title  Increase Berg score to >/= 52/56 for reduced fall risk. UPDATED LTG  Berg score >/= 54/56    Baseline  45/56 on 01-15-19;  52/56 on 02-12-19;  53/56 on 03-09-19    Time  4    Period  Weeks    Status  Achieved    Target Date  04/24/19      PT LONG TERM GOAL #2   Title  Improve TUG score to </= 10 secs without device to demonstrate high mobility.    Baseline  14.07 secs without device;  7.31 secs on 03-09-19    Time  8    Period  Weeks    Status  Achieved      PT LONG TERM GOAL #3   Title  Improve FGA score by at least 8 points to demonstrate improved balance with gait and for reduced fall risk.  UPDATED LTG:  Increase FGA score from 24/30 to >/= 28/30 for increased safety with ambulation.    Baseline  24/30 on 03-09-19:  initial score 12/30 on 01-20-19; 20/30 on 02-16-19  Time  8    Period  Weeks    Status  Achieved    Target Date  04/24/19      PT LONG TERM GOAL #4   Title  Increase gait velocity to >/= 3.2 ft/sec for incr. gait efficiency with minimal postural deviations exhibited.    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    Baseline  balance with tandem walking is improving but pt not able to perform this activity safely as of current time - goal ongoing    Time  8    Period  Weeks    Status  On-going    Target Date  04/24/19      PT LONG TERM GOAL #6   Title  Pt will be independent in updated HEP as appropriate, including aquatic exercises.    Time  8    Period  Weeks    Status  On-going    Target Date  04/24/19            Plan - 04/01/19 1801    Clinical Impression Statement  Aquatic therapy session focused on dynamic standing balance, high level static standing balance with increased vestibular input and dynamic gait activities in water for resistance for strengthening as well as for perturbations provided by water current. Pt ahad some difficulty maintaining balance with Ai Chi postures indicative of decreased core stabilization.  Pt denies having pain that he occurred during last session and was able to tolerate full session without rest breaks.    Comorbidities  h/o cervical fusion (C5-7) approx. 2012 with neck injury in Dec. 2016 (pt slipped off edge of bed):  CKD stage 3;  diabetic retinopathy bil. eyes; DM type 2:  HTN    PT Frequency  2x / week    PT Duration  4 weeks   renewal completed on 03-10-19 for 4 additional weeks   PT Treatment/Interventions  ADLs/Self Care Home Management;Aquatic Therapy;Gait training;Stair training;Functional mobility training;Therapeutic activities;Therapeutic exercise;Balance training;Neuromuscular re-education;Patient/family education    PT Next Visit Plan  Continue high level balance, gait, and strengthening    PT Home Exercise Plan  Access Code:  [email or web address] URL: [email or web address] Date: 01/20/2019 Prepared by: Modena Morrow    Consulted and Agree with Plan of Care  Patient    Family Member Consulted  wife [name]       Patient will benefit from skilled therapeutic intervention in order to improve the following deficits and impairments:     Visit Diagnosis: Unsteadiness on feet  Other abnormalities of gait and mobility     Problem List Patient Active Problem List   Diagnosis Date Noted  . CKD (chronic kidney disease) 08/28/2018  . Retinopathy of both eyes 08/28/2018  . 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 Stroke ([facility]) 08/08/2018  . Diabetes mellitus ([facility]) 07/25/2015  . Neck injury 06/22/2015  . Hearing loss in right ear 10/14/2011  . Agent orange exposure 10/14/2011  . ADD (attention deficit disorder) 10/14/2011  . Hyperlipidemia 10/14/2011  . BMI 28.0-28.9,adult 10/14/2011  Newell Coralenise Terry Robertson, [place] [facility] 04/01/19 6:04 PM Phone: [phone] Fax: [phone]   [facility] [address] [place], [place], [address] Phone: [phone]   Fax:  [phone]  Name: Jeff [name] MRN: [id number] Date of Birth: [birth year]

## 2019-04-06 ENCOUNTER — Other Ambulatory Visit: Payer: Self-pay

## 2019-04-06 ENCOUNTER — Ambulatory Visit: Payer: Medicare Other | Attending: Physical Medicine & Rehabilitation | Admitting: Physical Therapy

## 2019-04-06 DIAGNOSIS — R2681 Unsteadiness on feet: Secondary | ICD-10-CM | POA: Diagnosis present

## 2019-04-06 DIAGNOSIS — R2689 Other abnormalities of gait and mobility: Secondary | ICD-10-CM | POA: Diagnosis present

## 2019-04-06 DIAGNOSIS — I69351 Hemiplegia and hemiparesis following cerebral infarction affecting right dominant side: Secondary | ICD-10-CM | POA: Diagnosis present

## 2019-04-06 DIAGNOSIS — R293 Abnormal posture: Secondary | ICD-10-CM | POA: Diagnosis present

## 2019-04-06 NOTE — Therapy (Signed)
[facility] [address] [place], [place], [address] Phone: [phone]   Fax:  [phone]  Physical Therapy Treatment  Patient Details  Name: Jeff [name] MRN: [id number] Date of Birth: 1951-05-27 Referring Provider (PT): Dr. Alysia Penna   Encounter Date: 04/06/2019  PT End of Session - 04/06/19 1222    Visit Number  20    Number of Visits  25    Date for PT Re-Evaluation  04/17/19    Authorization Type  UHC Medicare    Authorization Time Period  01-15-19 - 04-15-19;  03-10-19 - 06-03-19    PT Start Time  1018    PT Stop Time  1103    PT Time Calculation (min)  45 min    Equipment Utilized During Treatment  Gait belt    Activity Tolerance  Patient tolerated treatment well    Behavior During Therapy  [facility] for tasks assessed/performed       Past Medical History:  Diagnosis Date  . ADHD (attention deficit hyperactivity disorder)   . Diabetes mellitus   . Hyperlipidemia   . Hypertension     Past Surgical History:  Procedure Laterality Date  . NECK SURGERY    . SPINE SURGERY     Cervical spine x 2; Hardin Negus; [employer].    There were no vitals filed for this visit.       Ideal Adult PT Treatment/Exercise - 04/06/19 0001      Transfers   Transfers  Sit to Stand    Sit to Stand  5: Supervision    Stand to Sit  5: Supervision    Number of Reps  10 reps;2 sets   10 on noncompliant and 10 on compliant   Transfer Cueing  no UE support      Ambulation/Gait   Ambulation/Gait  Yes    Ambulation/Gait Assistance  6: Modified independent (Device/Increase time)    Ambulation/Gait Assistance Details  through out gym between activities and for high level balance    Assistive device  None    Gait Pattern  Ataxic;Lateral trunk lean to left;Wide base of support;Trunk flexed    Ambulation Surface  Level;Indoor      High Level Balance   High Level Balance Activities  Side stepping;Braiding;Backward walking;Tandem  walking;Marching forwards;Marching backwards    High Level Balance Comments  Performed above both in // bars and in hallway with wall for intermittent support.  Provided as HEP.  Also performed standing facing up ramp then down ramp for static standing with EO, EC, feet together EO and EC, partial tandem stance EO and EC.  Pt needing intermittent UE support to balance.  Rocker board both directions eyes open and eyes closed with 1 UE to intetrmittent UE support.  Prostretch forward/backward for balance and for stretch with intermittent UE support.  SLS x 10 sec x 4 each side and tandem stance x 10 sec x 4 each direction.      Self-Care   Self-Care  Other Self-Care Comments    Other Self-Care Comments   Again discussed importance of HEP in addition to therapy in clinic.  Pt feels like his balance is still impaired and is not able to do the things he could before his stroke             PT Education - 04/06/19 1221    Education Details  Additions to HEP, importance of HEP    Person(s) Educated  Patient;Spouse    Methods  Explanation;Demonstration;Handout    Comprehension  Verbalized understanding;Other (comment)   needs reinforcement to perform HEP      PT Short Term Goals - 03/31/19 2029      PT SHORT TERM GOAL #1   Title  Improve Berg score from 45/56 to >/= 49/56 for reduced fall risk.    Baseline  45/56 on 01-15-19; 52/56 on 02-12-19    Time  4    Period  Weeks    Status  Achieved    Target Date  02/15/19      PT SHORT TERM GOAL #2   Title  Improve TUG score from 14.07 to </= 12.5 secs with no device for decr. fall risk.    Baseline  14.07 secs on 01-15-19; 11.84 secs on 02-12-19    Time  4    Period  Weeks    Status  Achieved    Target Date  02/15/19      PT SHORT TERM GOAL #3   Title  Incr. gait velocity from 2.44 ft/sec to >/= 2.8 ft/sec for incr. gait efficiency, with pt demonstrating minimal Rt trunk lean.    Baseline  13.47 secs = 2.44 ft/sec without device;  11.54secs  = 2.84 ft/sec without device    Time  4    Period  Weeks    Status  Achieved    Target Date  02/15/19      PT SHORT TERM GOAL #4   Title  Incr. FGA score by at least 5 points from baseline to demonstrate improved balance with ambulation.    Baseline  Scored 20/30 on 02/16/19    Time  4    Period  Weeks    Status  Achieved    Target Date  02/15/19      PT SHORT TERM GOAL #5   Title  Independent in HEP for balance & strengthening exs.    Time  4    Period  Weeks    Status  Achieved    Target Date  02/15/19        PT Long Term Goals - 03/31/19 2030      PT LONG TERM GOAL #1   Title  Increase Berg score to >/= 52/56 for reduced fall risk. UPDATED LTG  Berg score >/= 54/56    Baseline  45/56 on 01-15-19;  52/56 on 02-12-19;  53/56 on 03-09-19    Time  4    Period  Weeks    Status  Achieved    Target Date  04/24/19      PT LONG TERM GOAL #2   Title  Improve TUG score to </= 10 secs without device to demonstrate high mobility.    Baseline  14.07 secs without device;  7.31 secs on 03-09-19    Time  8    Period  Weeks    Status  Achieved      PT LONG TERM GOAL #3   Title  Improve FGA score by at least 8 points to demonstrate improved balance with gait and for reduced fall risk.  UPDATED LTG:  Increase FGA score from 24/30 to >/= 28/30 for increased safety with ambulation.    Baseline  24/30 on 03-09-19:  initial score 12/30 on 01-20-19; 20/30 on 02-16-19    Time  8    Period  Weeks    Status  Achieved    Target Date  04/24/19      PT LONG TERM GOAL #4   Title  Increase gait velocity to >/= 3.2 ft/sec for incr. gait efficiency with minimal postural deviations exhibited.    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    Baseline  balance with tandem walking is improving but pt not able to perform this activity safely as of current time - goal ongoing    Time  8    Period  Weeks    Status  On-going    Target Date  04/24/19      PT LONG TERM GOAL #6   Title  Pt will be independent in updated HEP as appropriate, including aquatic exercises.    Time  8    Period  Weeks    Status  On-going    Target Date  04/24/19            Plan - 04/06/19 1222    Clinical Impression Statement  Pt continues to have difficulty with SLS and tandem stance along with high level gait activities and balance with EC.  Continue PT per POC.    Comorbidities  h/o cervical fusion (C5-7) approx. 2012 with neck injury in Dec. 2016 (pt slipped off edge of bed):  CKD stage 3;  diabetic retinopathy bil. eyes; DM type 2:  HTN    PT Frequency  2x / week    PT Duration  4 weeks   renewal completed on 03-10-19 for 4 additional weeks   PT Treatment/Interventions  ADLs/Self Care Home Management;Aquatic Therapy;Gait training;Stair training;Functional mobility training;Therapeutic activities;Therapeutic exercise;Balance training;Neuromuscular re-education;Patient/family education    PT Next Visit Plan  Continue high level balance, gait, and strengthening    PT Home Exercise Plan  Access Code: [email or web address] URL: [email or web address] Date: 01/20/2019 Prepared by: Modena Morrow    Consulted and Agree with Plan of Care  Patient    Family Member Consulted  wife [name]       Patient will benefit from skilled therapeutic intervention in order to improve the following deficits and impairments:     Visit Diagnosis: Unsteadiness on feet  Other abnormalities of gait and mobility  Hemiplegia and hemiparesis following cerebral infarction affecting right dominant side [facility])     Problem List Patient Active Problem List   Diagnosis Date Noted  . CKD (chronic kidney disease)  08/28/2018  . Retinopathy of both eyes 08/28/2018  . 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 Stroke (HCC) 08/08/2018  . Diabetes mellitus (HCC) 07/25/2015  . Neck injury 06/22/2015  . Hearing loss in right ear 10/14/2011  . Agent orange exposure 10/14/2011  . ADD (attention deficit disorder) 10/14/2011  . Hyperlipidemia 10/14/2011  . BMI 28.0-28.9,adult 10/14/2011    Newell Coral, PTA [facility] 04/06/19 12:25 PM Phone: [phone] Fax: [phone]   [facility] [address] [place], [place], [address] Phone: [phone]   Fax:  [phone]  Name: Jeff [name] MRN: [id number] Date of Birth: 1950-10-01

## 2019-04-06 NOTE — Patient Instructions (Signed)
Access Code: [email or web address]  URL: [email or web address]  Date: 04/06/2019  Prepared by: Nita Sells   Exercises  Walking March - 10 reps - 3 sets - 1x daily - 7x weekly  Braided Sidestepping with Arms Out - 10 reps - 3 sets - 1x daily - 7x weekly  Walking Tandem Stance - 10 reps - 3 sets - 1x daily - 7x weekly  Backward Tandem Walking - 10 reps - 3 sets - 1x daily - 7x weekly  Single Leg Stance - 5 reps - 10 hold - 1x daily - 7x weekly

## 2019-04-08 ENCOUNTER — Ambulatory Visit: Payer: Medicare Other | Admitting: Physical Therapy

## 2019-04-08 ENCOUNTER — Encounter: Payer: Self-pay | Admitting: Physical Therapy

## 2019-04-08 ENCOUNTER — Other Ambulatory Visit: Payer: Self-pay

## 2019-04-08 DIAGNOSIS — R293 Abnormal posture: Secondary | ICD-10-CM

## 2019-04-08 DIAGNOSIS — R2681 Unsteadiness on feet: Secondary | ICD-10-CM | POA: Diagnosis not present

## 2019-04-08 DIAGNOSIS — I69351 Hemiplegia and hemiparesis following cerebral infarction affecting right dominant side: Secondary | ICD-10-CM

## 2019-04-08 DIAGNOSIS — R2689 Other abnormalities of gait and mobility: Secondary | ICD-10-CM

## 2019-04-08 NOTE — Therapy (Signed)
[facility] [address] [place], [place], [address] Phone: [phone]   Fax:  [phone]  Physical Therapy Treatment  Patient Details  Name: ZAIDIN BLYDEN MRN: [id number] Date of Birth: 21-Apr-1951 Referring Provider (PT): Dr. Claudette Laws   Encounter Date: 04/08/2019  PT End of Session - 04/08/19 1732    Visit Number  21    Number of Visits  25    Date for PT Re-Evaluation  04/17/19    Authorization Type  UHC Medicare    Authorization Time Period  01-15-19 - 04-15-19;  03-10-19 - 06-03-19    PT Start Time  1336    PT Stop Time  1416    PT Time Calculation (min)  40 min    Activity Tolerance  Patient tolerated treatment well    Behavior During Therapy  [facility] for tasks assessed/performed       Past Medical History:  Diagnosis Date  . ADHD (attention deficit hyperactivity disorder)   . Diabetes mellitus   . Hyperlipidemia   . Hypertension     Past Surgical History:  Procedure Laterality Date  . NECK SURGERY    . SPINE SURGERY     Cervical spine x 2; [employer]; [employer].    There were no vitals filed for this visit.  Subjective Assessment - 04/08/19 1729    Subjective  Pt states frustration over not being able to schedule land appointments due to no availability.  Feels like he does better with consistency.    Patient is accompained by:  Family member    Pertinent History  Type 2 DM with diabetic retinopathy (did not drive prior to CVA); stage 3 kidney disease; h/o cervical fusion C5-7 (approx. 2012) and h/o neck injury in Dec. 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  No/denies      Aquatic therapy at [facility] - pool temp. 87.4 degrees   Patient seen for aquatic therapy today.  Treatment took place in water 3.5-4 feet deep depending upon activity.  Pt  entered and exited  the pool via step negotiation with use of handrails with supervision.   Pt performed water walking for dynamic gait training forwards x 2 rep across pool (82m); pt performed backwards amb. 5m x 2 rep, sideways amb. With squat X 2 reps;jogging 42m x 2 reps   Balance activities included standing with narrow BOS with EO x 15 secs, EC x 10 Secs; added standing with EC with horizontal head turns x 10 reps, vertical head turns with EC 10 reps.  Tandem stance x 3 reps each direction x 10 sec.  Tandem stance x 3 additional reps each direction working on bil UE arm swing and trunk control.  Pt tends to elevate R shoulder but able to control with cues.   SLS x 10 sec x 3 reps each side   Pt performed 5m x 3 rep of amb. With tandem gait.     Pt performed bil LE hip flexion, hip extension, , hip abduction, hip flexion straight into hip extension with knee flexed-all with ankle cuffs and x 20 reps each.  Min assist of edge of pool for balance.   Squats at edge of pool with intermittent UE support x 10 reps then RLE squat x 10 then LLE  squat x 10     Pt requires buoyancy of water for support with balance activities for safety with reduced fall risk in aquatic environment; viscosity of water needed for strengthening RLE ; current of water provided perturbations with dynamic gait training and balance exercises.      PT Short Term Goals - 03/31/19 2029      PT SHORT TERM GOAL #1   Title  Improve Berg score from 45/56 to >/= 49/56 for reduced fall risk.    Baseline  45/56 on 01-15-19; 52/56 on 02-12-19    Time  4    Period  Weeks    Status  Achieved    Target Date  02/15/19      PT SHORT TERM GOAL #2   Title  Improve TUG score from 14.07 to </= 12.5 secs with no device for decr. fall risk.    Baseline  14.07 secs on 01-15-19; 11.84 secs on 02-12-19    Time  4    Period  Weeks    Status  Achieved    Target Date  02/15/19      PT SHORT TERM GOAL #3   Title  Incr. gait velocity  from 2.44 ft/sec to >/= 2.8 ft/sec for incr. gait efficiency, with pt demonstrating minimal Rt trunk lean.    Baseline  13.47 secs = 2.44 ft/sec without device;  11.54secs = 2.84 ft/sec without device    Time  4    Period  Weeks    Status  Achieved    Target Date  02/15/19      PT SHORT TERM GOAL #4   Title  Incr. FGA score by at least 5 points from baseline to demonstrate improved balance with ambulation.    Baseline  Scored 20/30 on 02/16/19    Time  4    Period  Weeks    Status  Achieved    Target Date  02/15/19      PT SHORT TERM GOAL #5   Title  Independent in HEP for balance & strengthening exs.    Time  4    Period  Weeks    Status  Achieved    Target Date  02/15/19        PT Long Term Goals - 03/31/19 2030      PT LONG TERM GOAL #1   Title  Increase Berg score to >/= 52/56 for reduced fall risk. UPDATED LTG  Berg score >/= 54/56    Baseline  45/56 on 01-15-19;  52/56 on 02-12-19;  53/56 on 03-09-19    Time  4    Period  Weeks    Status  Achieved    Target Date  04/24/19      PT LONG TERM GOAL #2   Title  Improve TUG score to </= 10 secs without device to demonstrate high mobility.    Baseline  14.07 secs without device;  7.31 secs on 03-09-19    Time  8    Period  Weeks    Status  Achieved      PT LONG TERM GOAL #3   Title  Improve FGA score by at least 8 points to demonstrate improved balance with gait and for reduced fall risk.  UPDATED LTG:  Increase FGA score from 24/30 to >/= 28/30 for increased safety with ambulation.    Baseline  24/30 on 03-09-19:  initial score 12/30 on 01-20-19; 20/30 on 02-16-19    Time  8  Period  Weeks    Status  Achieved    Target Date  04/24/19      PT LONG TERM GOAL #4   Title  Increase gait velocity to >/= 3.2 ft/sec for incr. gait efficiency with minimal postural deviations exhibited.    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    Baseline  balance with tandem walking is improving but pt not able to perform this activity safely as of current time - goal ongoing    Time  8    Period  Weeks    Status  On-going    Target Date  04/24/19      PT LONG TERM GOAL #6   Title  Pt will be independent in updated HEP as appropriate, including aquatic exercises.    Time  8    Period  Weeks    Status  On-going    Target Date  04/24/19            Plan - 04/08/19 1734    Clinical Impression Statement  Aquatic therapy session focused on dynamic standing balance, high level static standing balance with increased vestibular input and dynamic gait activities in water for resistance for strengthening as well as for perturbations provided by water current.  Pt with difficulty with tandem stance and tandem gait and uses UE's to assist with balance in water.  Pt with improved endurance during session today.    Comorbidities  h/o cervical fusion (C5-7) approx. 2012 with neck injury in Dec. 2016 (pt slipped off edge of bed):  CKD stage 3;  diabetic retinopathy bil. eyes; DM type 2:  HTN    PT Frequency  2x / week    PT Duration  4 weeks   renewal completed on 03-10-19 for 4 additional weeks   PT Treatment/Interventions  ADLs/Self Care Home Management;Aquatic Therapy;Gait training;Stair training;Functional mobility training;Therapeutic activities;Therapeutic exercise;Balance training;Neuromuscular re-education;Patient/family education    PT Next Visit Plan  Continue high level balance, gait, and strengthening    PT Home Exercise Plan  Access Code: [email or web address] URL: [email or web address] Date: 01/20/2019 Prepared by: Nita Sells    Consulted and Agree with Plan of Care  Patient    Family Member  Consulted  wife [name]       Patient will benefit from skilled therapeutic intervention in order to improve the following deficits and impairments:     Visit Diagnosis: Unsteadiness on feet  Other abnormalities of gait and mobility  Hemiplegia and hemiparesis following cerebral infarction affecting right dominant side (HCC)  Abnormal posture     Problem List Patient Active Problem List   Diagnosis Date Noted  . CKD (chronic kidney disease) 08/28/2018  . Retinopathy of both eyes 08/28/2018  . Stroke due to embolism of left vertebral artery ([place]) 08/12/2018  . Stroke ([place]) 08/08/2018  . Diabetes mellitus ([place]) 07/25/2015  . Neck injury 06/22/2015  . Hearing loss in right ear 10/14/2011  . Agent orange exposure 10/14/2011  . ADD (attention deficit disorder) 10/14/2011  . Hyperlipidemia 10/14/2011  . BMI 28.0-28.9,adult 10/14/2011  Newell Coralenise Terry Mayo Owczarzak, [place] [facility] 04/08/19 5:36 PM Phone: [phone] Fax: [phone]   [facility] [address] [place], [place], [address] Phone: [phone]   Fax:  [phone]  Name: Leilani AbleJay W Meiklejohn MRN: [id number] Date of Birth: 07-06-1950

## 2019-04-13 ENCOUNTER — Ambulatory Visit: Payer: Medicare Other | Admitting: Physical Therapy

## 2019-04-20 ENCOUNTER — Encounter: Payer: Self-pay | Admitting: Physical Therapy

## 2019-04-20 ENCOUNTER — Other Ambulatory Visit: Payer: Self-pay

## 2019-04-20 ENCOUNTER — Ambulatory Visit: Payer: Medicare Other | Admitting: Physical Therapy

## 2019-04-20 DIAGNOSIS — R2681 Unsteadiness on feet: Secondary | ICD-10-CM

## 2019-04-20 DIAGNOSIS — R2689 Other abnormalities of gait and mobility: Secondary | ICD-10-CM

## 2019-04-21 NOTE — Therapy (Signed)
[facility] [address] [place], [place], [address] Phone: [phone]   Fax:  [phone]  Physical Therapy Treatment  Patient Details  Name: Jeff [name] MRN: [id number] Date of Birth: [birth year] Referring Provider (PT): Dr. Alysia Penna   Encounter Date: 04/20/2019  PT End of Session - 04/21/19 0815    Visit Number  22    Number of Visits  25    Date for PT Re-Evaluation  04/24/19    Authorization Type  [employer] Medicare    Authorization Time Period  01-15-19 - 04-15-19;  03-10-19 - 06-03-19    PT Start Time  0932    PT Stop Time  1016    PT Time Calculation (min)  44 min    Activity Tolerance  Patient tolerated treatment well    Behavior During Therapy  [facility] for tasks assessed/performed       Past Medical History:  Diagnosis Date  . ADHD (attention deficit hyperactivity disorder)   . Diabetes mellitus   . Hyperlipidemia   . Hypertension     Past Surgical History:  Procedure Laterality Date  . NECK SURGERY    . SPINE SURGERY     Cervical spine x 2; Hardin Negus; [employer].    There were no vitals filed for this visit.  Subjective Assessment - 04/20/19 0939    Subjective  Pt reports he has been doing exercises regularly at home - feels balance is improving    Patient is accompained by:  Family member    Pertinent History  Type 2 DM with diabetic retinopathy (did not drive prior to CVA); stage 3 kidney disease; h/o cervical fusion C5-7 (approx. 2012) and h/o neck injury in Dec. 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[employer] - wants to be able to do sway walk in these processions (he descibes sway walk as a tandem walk)                    NeuroRe-ed:  Pt performed partial tandem stance inside // bars- EO with targets side to side and up/down 5 reps each with UE support prn;  EC in same partial stance - switching foot position - horizontal and vertical head  turns  Standing on Bosu inside // bars - squats 5 reps EO;  Squats 5 reps with EC without UE support with CGA Standing on pillows in corner - marching in place (no head movement due to c/o light-headedness) 10 reps with EO  Pt stood with feet together on floor - making circles clockwise/counterclockwise, "V" and "+" patterns - tracking ball with eyes and head moving For improved gaze stabilization - pt c/o lightheadedness after this activity - required seated rest period        Balance Exercises - 04/21/19 0811      Balance Exercises: Standing   Standing Eyes Opened  Narrow base of support (BOS);Wide (BOA);Head turns;Foam/compliant surface;5 reps   on blue foam balance beam & on rocker board   Standing Eyes Closed  Head turns;Wide (BOA);Foam/compliant surface;Narrow base of support (BOS);5 reps    Tandem Stance  Eyes open;Foam/compliant surface;Intermittent upper extremity support;5 reps    Rockerboard  Anterior/posterior;Head turns;EO;EC;10 reps    Balance Beam  standing perpendicular on blue foam balance beam - head turns slowly 5 reps side to side    Tandem Gait  Forward;Retro;Other reps (comment)   6 reps forward/back inside // bars without UE support  Other Standing Exercises  marching on blue Airex inside // bars with UE support prn          PT Short Term Goals - 03/31/19 2029      PT SHORT TERM GOAL #1   Title  Improve Berg score from 45/56 to >/= 49/56 for reduced fall risk.    Baseline  45/56 on 01-15-19; 52/56 on 02-12-19    Time  4    Period  Weeks    Status  Achieved    Target Date  02/15/19      PT SHORT TERM GOAL #2   Title  Improve TUG score from 14.07 to </= 12.5 secs with no device for decr. fall risk.    Baseline  14.07 secs on 01-15-19; 11.84 secs on 02-12-19    Time  4    Period  Weeks    Status  Achieved    Target Date  02/15/19      PT SHORT TERM GOAL #3   Title  Incr. gait velocity from 2.44 ft/sec to >/= 2.8 ft/sec for incr. gait efficiency,  with pt demonstrating minimal Rt trunk lean.    Baseline  13.47 secs = 2.44 ft/sec without device;  11.54secs = 2.84 ft/sec without device    Time  4    Period  Weeks    Status  Achieved    Target Date  02/15/19      PT SHORT TERM GOAL #4   Title  Incr. FGA score by at least 5 points from baseline to demonstrate improved balance with ambulation.    Baseline  Scored 20/30 on 02/16/19    Time  4    Period  Weeks    Status  Achieved    Target Date  02/15/19      PT SHORT TERM GOAL #5   Title  Independent in HEP for balance & strengthening exs.    Time  4    Period  Weeks    Status  Achieved    Target Date  02/15/19        PT Long Term Goals - 04/21/19 0807      PT LONG TERM GOAL #1   Title  Increase Berg score to >/= 52/56 for reduced fall risk. UPDATED LTG  Berg score >/= 54/56    Baseline  45/56 on 01-15-19;  52/56 on 02-12-19;  53/56 on 03-09-19    Time  4    Period  Weeks    Status  Achieved      PT LONG TERM GOAL #2   Title  Improve TUG score to </= 10 secs without device to demonstrate high mobility.    Baseline  14.07 secs without device;  7.31 secs on 03-09-19    Time  8    Period  Weeks    Status  Achieved      PT LONG TERM GOAL #3   Title  Improve FGA score by at least 8 points to demonstrate improved balance with gait and for reduced fall risk.  UPDATED LTG:  Increase FGA score from 24/30 to >/= 28/30 for increased safety with ambulation.    Baseline  24/30 on 03-09-19:  initial score 12/30 on 01-20-19; 20/30 on 02-16-19    Time  8    Period  Weeks    Status  Achieved      PT LONG TERM GOAL #4   Title  Increase gait velocity to >/= 3.2 ft/sec for incr. gait efficiency  with minimal postural deviations exhibited.    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    Baseline  balance with tandem walking is improving but pt not able to perform this activity safely as of current time - goal ongoing    Time  8    Period  Weeks    Status  On-going      PT LONG TERM GOAL #6   Title  Pt will be independent in updated HEP as appropriate, including aquatic exercises.    Time  8    Period  Weeks    Status  On-going            Plan - 04/21/19 0817    Clinical Impression Statement  Pt demonstrates much improvement with high level balance skills of tandem stance, tandem walking (forward) and SLS; pt continues to have difficulty with backwards tandem walking with mild LOB occurring with need for UE support on // bars. Pt did c/o light-headedness after approx. 30" participation in vestibular activities requiring EO/EC and head turns - indicative of continued decreased vestibular input in maintaining balance.  Pt is making progress towards goals and requests to continue PT - will plan 4 additional land visits and 4 aquatic sessions.    Comorbidities  h/o cervical fusion (C5-7) approx. 2012 with neck injury in Dec. 2016 (pt slipped off edge of bed):  CKD stage 3;  diabetic retinopathy bil. eyes; DM type 2:  HTN    PT Frequency  2x / week    PT Duration  4 weeks   renewal completed on 03-10-19 for 4 additional weeks   PT Treatment/Interventions  ADLs/Self Care Home Management;Aquatic Therapy;Gait training;Stair training;Functional mobility training;Therapeutic activities;Therapeutic exercise;Balance training;Neuromuscular re-education;Patient/family education    PT Next Visit Plan  Check LTG's and renew next land visit ; aquatic therapy - cont with high level balance/vestibular exercises    PT Home Exercise Plan  Access Code: [email or web address] URL: [email or web address] Date: 01/20/2019 Prepared by: Modena Morrowenise Robertson    Consulted and Agree with  Plan of Care  Patient    Family Member Consulted  wife Darl PikesSusan       Patient will benefit from skilled therapeutic intervention in order to improve the following deficits and impairments:     Visit Diagnosis: Unsteadiness on feet  Other abnormalities of gait and mobility     Problem List Patient Active Problem List   Diagnosis Date Noted  . CKD (chronic kidney disease) 08/28/2018  . Retinopathy of both eyes 08/28/2018  . 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 Stroke (HCC) 08/08/2018  . Diabetes mellitus (HCC) 07/25/2015  . Neck injury 06/22/2015  . Hearing loss in right ear 10/14/2011  . Agent orange exposure 10/14/2011  . ADD (attention deficit disorder) 10/14/2011  . Hyperlipidemia 10/14/2011  . BMI 28.0-28.9,adult 10/14/2011    Kary Kosilday, Sravya Grissom Suzanne, PT 04/21/2019, 8:25 AM  [facility] [address] [place], [place], [address] Phone: [phone]   Fax:  [phone]  Name: Lupita LeashJay W  Allman MRN: [id number] Date of Birth: 04-21-1951

## 2019-04-22 ENCOUNTER — Ambulatory Visit: Payer: Medicare Other | Admitting: Physical Therapy

## 2019-04-22 ENCOUNTER — Other Ambulatory Visit: Payer: Self-pay

## 2019-04-22 ENCOUNTER — Encounter: Payer: Self-pay | Admitting: Physical Therapy

## 2019-04-22 DIAGNOSIS — R2681 Unsteadiness on feet: Secondary | ICD-10-CM | POA: Diagnosis not present

## 2019-04-22 DIAGNOSIS — R2689 Other abnormalities of gait and mobility: Secondary | ICD-10-CM

## 2019-04-22 NOTE — Therapy (Signed)
[facility] [address] [place], [place], [address] Phone: [phone]   Fax:  [phone]  Physical Therapy Treatment  Patient Details  Name: Jeff [name] MRN: [id number] Date of Birth: [birth year] Referring Provider (PT): Dr. Alysia Penna   Encounter Date: 04/22/2019  PT End of Session - 04/22/19 1900    Visit Number  23    Number of Visits  25    Date for PT Re-Evaluation  04/24/19    Authorization Type  [employer]HC Medicare    Authorization Time Period  01-15-19 - 04-15-19;  03-10-19 - 06-03-19    PT Start Time  1330    PT Stop Time  1415    PT Time Calculation (min)  45 min    Equipment Utilized During Treatment  Other (comment)   buoyancy cuffs, noodle   Activity Tolerance  Patient tolerated treatment well    Behavior During Therapy  [facility] for tasks assessed/performed       Past Medical History:  Diagnosis Date  . ADHD (attention deficit hyperactivity disorder)   . Diabetes mellitus   . Hyperlipidemia   . Hypertension     Past Surgical History:  Procedure Laterality Date  . NECK SURGERY    . SPINE SURGERY     Cervical spine x 2; Hardin Negus; [employer].    There were no vitals filed for this visit.  Subjective Assessment - 04/22/19 1859    Subjective  Denies any falls or changes.  Continues to work on exercises at home.  Feels balance is improving.    Patient is accompained by:  Family member    Pertinent History  Type 2 DM with diabetic retinopathy (did not drive prior to CVA); stage 3 kidney disease; h/o cervical fusion C5-7 (approx. 2012) and h/o neck injury in Dec. 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  No/denies        Aquatic therapy at [facility] - pool temp. 87.4 degrees  Patient seen for aquatic therapy today. Treatment took place in water  3.5-4 feet deep depending upon activity. Pt entered and exited the pool viastep negotiation with use of handrails with supervision.  Pt performed water walking for dynamic gait training forwards x 2 rep across pool (63m); 59m x 2 reps with high marching and alternate arm swing, 49m x 2 reps with bil ankle buoyancy cuffs, backwards amb. 62m x 2 rep, sideways amb. With squat X 2 repsLunges x 20m x 2 reps.  Balance activities included standing with narrow BOS with EO x 15 secs, EC x 10 Secs; added standing with EC with horizontal head turns x 10 reps, vertical head turns with EC 10 reps.  Tandem stance x 3 reps each direction x 10 sec.  SLS x 10 sec x 3 reps each side  Pt performed 4m x 3 rep of amb. With tandem gait.   Heel raises x 20, toe raises x 20.  Amb 86m on toes then 90m on heels  Pt performed bil LE hip flexion, hip extension, , hip abduction, hip flexion straight into hip extension with knee flexed-all with ankle cuffs and x 20 reps each. Min assist of edge of pool for balance.  Squats at edge of pool with intermittent UE support x 15 reps then RLE squat  x 15 then LLE squat x 15  Ai Chi postures of Enclosing, Soothing and Rounding x 15 reps.  Forward<>Backward stepping x 15 reps each side for balance.  Supine with pool noodle behind back for bicycling x 12m, hip abd/add x 67m.  Had pt in same position with feet on pool edge, bending bil knees then pushing away from wall.  Runners stretch performed x 30 sec each side along with heels on pool edge x 30 sec-both done at beginning and end of treatment.      Pt requires buoyancy of water for support with balance activities for safety with reduced fall risk in aquatic environment; viscosity of water needed for strengthening RLE ; current of water provided perturbations with dynamic gait training and balance exercises.      PT Short Term Goals - 03/31/19 2029      PT SHORT TERM GOAL #1   Title  Improve Berg score  from 45/56 to >/= 49/56 for reduced fall risk.    Baseline  45/56 on 01-15-19; 52/56 on 02-12-19    Time  4    Period  Weeks    Status  Achieved    Target Date  02/15/19      PT SHORT TERM GOAL #2   Title  Improve TUG score from 14.07 to </= 12.5 secs with no device for decr. fall risk.    Baseline  14.07 secs on 01-15-19; 11.84 secs on 02-12-19    Time  4    Period  Weeks    Status  Achieved    Target Date  02/15/19      PT SHORT TERM GOAL #3   Title  Incr. gait velocity from 2.44 ft/sec to >/= 2.8 ft/sec for incr. gait efficiency, with pt demonstrating minimal Rt trunk lean.    Baseline  13.47 secs = 2.44 ft/sec without device;  11.54secs = 2.84 ft/sec without device    Time  4    Period  Weeks    Status  Achieved    Target Date  02/15/19      PT SHORT TERM GOAL #4   Title  Incr. FGA score by at least 5 points from baseline to demonstrate improved balance with ambulation.    Baseline  Scored 20/30 on 02/16/19    Time  4    Period  Weeks    Status  Achieved    Target Date  02/15/19      PT SHORT TERM GOAL #5   Title  Independent in HEP for balance & strengthening exs.    Time  4    Period  Weeks    Status  Achieved    Target Date  02/15/19        PT Long Term Goals - 04/21/19 0807      PT LONG TERM GOAL #1   Title  Increase Berg score to >/= 52/56 for reduced fall risk. UPDATED LTG  Berg score >/= 54/56    Baseline  45/56 on 01-15-19;  52/56 on 02-12-19;  53/56 on 03-09-19    Time  4    Period  Weeks    Status  Achieved      PT LONG TERM GOAL #2   Title  Improve TUG score to </= 10 secs without device to demonstrate high mobility.    Baseline  14.07 secs without device;  7.31 secs on 03-09-19    Time  8    Period  Weeks  Status  Achieved      PT LONG TERM GOAL #3   Title  Improve FGA score by at least 8 points to demonstrate improved balance with gait and for reduced fall risk.  UPDATED LTG:  Increase FGA score from 24/30 to >/= 28/30 for increased safety with  ambulation.    Baseline  24/30 on 03-09-19:  initial score 12/30 on 01-20-19; 20/30 on 02-16-19    Time  8    Period  Weeks    Status  Achieved      PT LONG TERM GOAL #4   Title  Increase gait velocity to >/= 3.2 ft/sec for incr. gait efficiency with minimal postural deviations exhibited.    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    Baseline  balance with tandem walking is improving but pt not able to perform this activity safely as of current time - goal ongoing    Time  8    Period  Weeks    Status  On-going      PT LONG TERM GOAL #6   Title  Pt will be independent in updated HEP as appropriate, including aquatic exercises.    Time  8    Period  Weeks    Status  On-going            Plan - 04/22/19 1901    Clinical Impression Statement  Aquatic session continued to focus on strength and balance.  Pt's balance is improving in pool and is performing with less UE assist for support than previous sessions.  Kerry FortSuzanne Dilday, PT, to check goals and next session and plans to renew.    Comorbidities  h/o cervical fusion (C5-7) approx. 2012 with neck injury in Dec. 2016 (pt slipped off edge of bed):  CKD stage 3;  diabetic retinopathy bil. eyes; DM type 2:  HTN    PT Frequency  2x / week    PT Duration  4 weeks   renewal completed on 03-10-19 for 4 additional weeks   PT Treatment/Interventions  ADLs/Self Care Home Management;Aquatic Therapy;Gait training;Stair training;Functional mobility training;Therapeutic activities;Therapeutic exercise;Balance training;Neuromuscular re-education;Patient/family education    PT Next Visit Plan  Check LTG's and renew next land visit ;  aquatic therapy - cont with high level balance/vestibular exercises    PT Home Exercise Plan  Access Code: [email or web address] URL: [email or web address] Date: 01/20/2019 Prepared by: Modena Morrowenise Robertson    Consulted and Agree with Plan of Care  Patient    Family Member Consulted  wife Darl PikesSusan       Patient will benefit from skilled therapeutic intervention in order to improve the following deficits and impairments:     Visit Diagnosis: Unsteadiness on feet  Other abnormalities of gait and mobility     Problem List Patient Active Problem List   Diagnosis Date Noted  . CKD (chronic kidney disease) 08/28/2018  . Retinopathy of both eyes 08/28/2018  . 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 Stroke (HCC) 08/08/2018  . Diabetes mellitus (HCC) 07/25/2015  . Neck  injury 06/22/2015  . Hearing loss in right ear 10/14/2011  . Agent orange exposure 10/14/2011  . ADD (attention deficit disorder) 10/14/2011  . Hyperlipidemia 10/14/2011  . BMI 28.0-28.9,adult 10/14/2011    Newell Coral, PTA [facility] 04/22/19 7:12 PM Phone: [phone] Fax: [phone]   [facility] [address] [place], [place], [address] Phone: [phone]   Fax:  [phone]  Name: Jeff [name] MRN: [id number] Date of Birth: 05/10/1951

## 2019-04-27 ENCOUNTER — Ambulatory Visit: Payer: Medicare Other | Admitting: Physical Therapy

## 2019-04-27 ENCOUNTER — Other Ambulatory Visit: Payer: Self-pay

## 2019-04-27 DIAGNOSIS — R2681 Unsteadiness on feet: Secondary | ICD-10-CM | POA: Diagnosis not present

## 2019-04-27 DIAGNOSIS — I69351 Hemiplegia and hemiparesis following cerebral infarction affecting right dominant side: Secondary | ICD-10-CM

## 2019-04-27 DIAGNOSIS — R2689 Other abnormalities of gait and mobility: Secondary | ICD-10-CM

## 2019-04-28 ENCOUNTER — Encounter: Payer: Self-pay | Admitting: Physical Therapy

## 2019-04-28 NOTE — Therapy (Signed)
[facility] [address] [place], [place], [address] Phone: [phone]   Fax:  [phone]  Physical Therapy Treatment  Patient Details  Name: Jeff [name] MRN: [id number] Date of Birth: [birth year] Referring Provider (PT): Dr. Claudette LawsAndrew Kirsteins   Encounter Date: 04/27/2019  PT End of Session - 04/28/19 2105    Visit Number  24    Number of Visits  31    Date for PT Re-Evaluation  05/27/19    Authorization Type  [employer] Medicare    Authorization Time Period  01-15-19 - 04-15-19;  03-10-19 - 06-03-19;  04-27-19 - 06-04-19    PT Start Time  0846    PT Stop Time  0930    PT Time Calculation (min)  44 min    Equipment Utilized During Treatment  Other (comment)   buoyancy cuffs, noodle   Activity Tolerance  Patient tolerated treatment well    Behavior During Therapy  [facility] for tasks assessed/performed       Past Medical History:  Diagnosis Date  . ADHD (attention deficit hyperactivity disorder)   . Diabetes mellitus   . Hyperlipidemia   . Hypertension     Past Surgical History:  Procedure Laterality Date  . NECK SURGERY    . SPINE SURGERY     Cervical spine x 2; [employer]; [employer].    There were no vitals filed for this visit.  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  No/denies         [facility] PT Assessment - 04/28/19 0001      Functional Gait  Assessment   Gait assessed   Yes    Gait Level Surface  Walks 20 ft in less than 5.5 sec, no assistive devices, good speed, no evidence for imbalance, normal gait pattern, deviates no more than 6 in outside of the 12 in walkway width.   4.16   Change in Gait Speed   Able to smoothly change walking speed without loss of balance or gait deviation. Deviate no more than 6 in outside of the 12 in walkway width.    Gait with Horizontal Head Turns  Performs head turns smoothly with no change in gait. Deviates no more than 6 in outside 12 in walkway width    Gait with Vertical Head Turns  Performs task with moderate change in gait velocity, slows down, deviates 10-15 in outside 12 in walkway width but recovers, can continue to walk.    Gait and Pivot Turn  Pivot turns safely within 3 sec and stops quickly with no loss of balance.    Step Over Obstacle  Is able to step over 2 stacked shoe boxes taped together (9 in total height) without changing gait speed. No evidence of imbalance.    Gait with Narrow Base of Support  Is able to ambulate for 10 steps heel to toe with no staggering.    Gait with Eyes Closed  Walks 20 ft, uses assistive device, slower speed, mild gait deviations, deviates 6-10 in outside 12 in walkway width. Ambulates 20 ft in less than 9 sec but greater  than 7 sec.    Ambulating Backwards  Walks 20 ft, no assistive devices, good speed, no evidence for imbalance, normal gait    Steps  Alternating feet, no rail.    Total Score  27                        Balance Exercises - 04/28/19 2103      Balance Exercises: Standing   Standing Eyes Opened  Narrow base of support (BOS);Wide (BOA);Head turns;Foam/compliant surface;5 reps   on blue foam balance beam & on rocker board   Standing Eyes Closed  Head turns;Wide (BOA);Foam/compliant surface;Narrow base of support (BOS);5 reps    Tandem Stance  Eyes open;Foam/compliant surface;Intermittent upper extremity support;5 reps   on blue foam balance beam   Balance Beam  standing perpendicular on blue foam balance beam - head turns slowly 5 reps side to side    Tandem Gait  Forward;Retro;2 reps   forward 40'x 2 reps; backward 25' x 1 rep         PT Short Term Goals - 04/28/19 2112      PT  SHORT TERM GOAL #1   Title  Improve Berg score from 45/56 to >/= 49/56 for reduced fall risk.    Baseline  45/56 on 01-15-19; 52/56 on 02-12-19    Time  4    Period  Weeks    Status  Achieved    Target Date  02/15/19      PT SHORT TERM GOAL #2   Title  Improve TUG score from 14.07 to </= 12.5 secs with no device for decr. fall risk.    Baseline  14.07 secs on 01-15-19; 11.84 secs on 02-12-19    Time  4    Period  Weeks    Status  Achieved    Target Date  02/15/19      PT SHORT TERM GOAL #3   Title  Incr. gait velocity from 2.44 ft/sec to >/= 2.8 ft/sec for incr. gait efficiency, with pt demonstrating minimal Rt trunk lean.    Baseline  13.47 secs = 2.44 ft/sec without device;  11.54secs = 2.84 ft/sec without device    Time  4    Period  Weeks    Status  Achieved    Target Date  02/15/19      PT SHORT TERM GOAL #4   Title  Incr. FGA score by at least 5 points from baseline to demonstrate improved balance with ambulation.    Baseline  Scored 20/30 on 02/16/19    Time  4    Period  Weeks    Status  Achieved    Target Date  02/15/19      PT SHORT TERM GOAL #5   Title  Independent in HEP for balance & strengthening exs.    Time  4    Period  Weeks    Status  Achieved    Target Date  02/15/19        PT Long Term Goals - 04/28/19 2115      PT LONG TERM GOAL #1   Title  Increase Berg score to >/= 52/56 for reduced fall risk. UPDATED LTG  Berg score >/= 54/56    Baseline  45/56 on 01-15-19;  52/56 on 02-12-19;  53/56 on 03-09-19    Time  4    Period  Weeks    Status  Achieved      PT LONG TERM GOAL #  2   Title  Improve TUG score to </= 10 secs without device to demonstrate high mobility.    Baseline  14.07 secs without device;  7.31 secs on 03-09-19    Time  8    Period  Weeks    Status  Achieved      PT LONG TERM GOAL #3   Title  Improve FGA score by at least 8 points to demonstrate improved balance with gait and for reduced fall risk.  UPDATED LTG:  Increase FGA score from  24/30 to >/= 28/30 for increased safety with ambulation.    Baseline  24/30 on 03-09-19:  initial score 12/30 on 01-20-19; 20/30 on 02-16-19; 27/30 on 04-27-19    Time  8    Period  Weeks    Status  On-going      PT LONG TERM GOAL #4   Title  Increase gait velocity to >/= 3.2 ft/sec for incr. gait efficiency with minimal postural deviations exhibited.    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    Baseline  balance with tandem walking is improving but pt not able to perform this activity safely as of current time - goal ongoing    Time  8    Period  Weeks    Status  On-going      PT LONG TERM GOAL #6   Title  Pt will be independent in updated HEP as appropriate, including aquatic exercises.    Time  8    Period  Weeks            Plan - 04/28/19 2116    Clinical Impression Statement  Pt's FGA score has increased from 20/30 on 03-09-19 to 27/30 on 04-27-19; pt is improving well with ability to perform tandem walk with minimal LOB in 40' distance, however, pt is not consistently performing in complete tandem due to balance deficits; renewal to be completed for 4 additional weeks    Comorbidities  h/o cervical fusion (C5-7) approx. 2012 with neck injury in Dec. 2016 (pt slipped off edge of bed):  CKD stage 3;  diabetic retinopathy bil. eyes; DM type 2:  HTN    PT Frequency  2x / week    PT Duration  4 weeks   renewal completed on 03-10-19 for 4 additional weeks   PT Treatment/Interventions  ADLs/Self Care Home Management;Aquatic Therapy;Gait training;Stair training;Functional mobility training;Therapeutic activities;Therapeutic exercise;Balance training;Neuromuscular  re-education;Patient/family education    PT Next Visit Plan  aquatic therapy - cont with high level balance/vestibular exercises    PT Home Exercise Plan  Access Code: [email or web address] URL: [email or web address] Date: 01/20/2019 Prepared by: Nita Sells    Consulted and Agree with Plan of Care  Patient    Family Member Consulted  wife [name]       Patient will benefit from skilled therapeutic intervention in order to improve the following deficits and impairments:     Visit Diagnosis: Unsteadiness on feet - Plan: PT plan of care cert/re-cert  Other abnormalities of gait and mobility - Plan: PT plan  of care cert/re-cert  Hemiplegia and hemiparesis following cerebral infarction affecting right dominant side (HCC) - Plan: PT plan of care cert/re-cert     Problem List Patient Active Problem List   Diagnosis Date Noted  . CKD (chronic kidney disease) 08/28/2018  . Retinopathy of both eyes 08/28/2018  . 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 Stroke (HCC) 08/08/2018  . Diabetes mellitus (HCC) 07/25/2015  . Neck injury 06/22/2015  . Hearing loss in right ear 10/14/2011  . Agent orange exposure 10/14/2011  . ADD (attention deficit disorder) 10/14/2011  . Hyperlipidemia 10/14/2011  . BMI 28.0-28.9,adult 10/14/2011    Kary Kos, PT 04/28/2019, 9:26 PM  [facility] [address] [place], [place], [address] Phone: [phone]   Fax:  [phone]  Name: Jeff [name] MRN: [id number] Date of Birth: 09-14-1950

## 2019-05-04 ENCOUNTER — Ambulatory Visit: Payer: Medicare Other | Admitting: Physical Therapy

## 2019-05-04 ENCOUNTER — Other Ambulatory Visit: Payer: Self-pay

## 2019-05-04 ENCOUNTER — Encounter: Payer: Self-pay | Admitting: Physical Therapy

## 2019-05-04 DIAGNOSIS — R2689 Other abnormalities of gait and mobility: Secondary | ICD-10-CM

## 2019-05-04 DIAGNOSIS — R2681 Unsteadiness on feet: Secondary | ICD-10-CM

## 2019-05-04 NOTE — Therapy (Signed)
[facility] [address] [place], [place], [address] Phone: [phone]   Fax:  [phone]  Physical Therapy Treatment  Patient Details  Name: Jeff [name] MRN: [id number] Date of Birth: May 03, 1951 Referring Provider (PT): Dr. Alysia Penna   Encounter Date: 05/04/2019  PT End of Session - 05/04/19 1800    Visit Number  25    Number of Visits  31    Date for PT Re-Evaluation  05/27/19    Authorization Type  UHC Medicare    Authorization Time Period  01-15-19 - 04-15-19;  03-10-19 - 06-03-19;  04-27-19 - 06-04-19    PT Start Time  1333    PT Stop Time  1415    PT Time Calculation (min)  42 min    Equipment Utilized During Treatment  Other (comment)   noodle   Activity Tolerance  Patient tolerated treatment well    Behavior During Therapy  [facility] for tasks assessed/performed       Past Medical History:  Diagnosis Date  . ADHD (attention deficit hyperactivity disorder)   . Diabetes mellitus   . Hyperlipidemia   . Hypertension     Past Surgical History:  Procedure Laterality Date  . NECK SURGERY    . SPINE SURGERY     Cervical spine x 2; Hardin Negus; [employer].    There were no vitals filed for this visit.  Subjective Assessment - 05/04/19 1759    Subjective  Denies any falls or changes.  Having some discomfort in R ankle in session today.  Feels like his balance is worse today in pool.    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  No/denies       Aquatic therapy at [facility] - pool temp. 87.4 degrees  Patient seen for aquatic therapy today. Treatment took place in water 3.5-4 feet deep depending upon activity. Pt entered and exited the pool viastep negotiation with use of handrails with supervision.  Pt performed water walking for dynamic gait training  forwards x 2 rep across pool (68m); 32m x 2 reps with high marching and alternate arm swing, 27m x 2 at increased speed, backwards amb. 65m x 2 rep, sideways amb. With squat X 2 reps.  Attempted Lunges x 61m but pt with decreased ability to maintain balance even with cues to slow down lunge.  Pt performed 16m x 2 rep of amb. with tandem gait.   Pt performed bil LE hip flexion, hip extension, , hip abduction, hip flexion straight into hip extension with knee flexed-all  x 20 reps each. Min assist of edge of pool for balance.  Squats at edge of pool with intermittent UE support x 15 reps then RLE squat x 15 then LLE squat x 15  Ai Chi postures of Enclosing, Soothing and Rounding x 15 reps.  Supine with pool noodle behind back for bicycling x 68m, hip abd/add x 49m.  Had pt in same position with feet on pool edge, bending bil knees then pushing away from wall.  Runners stretch performed x 30 sec each side along with heels on pool edge x 30 sec-both done at beginning and end of treatment.      Pt requires buoyancy of water for support with balance activities for safety with reduced fall risk in aquatic environment; viscosity  of water needed for strengthening RLE ; current of water provided perturbations with dynamic gait training and balance exercises.         PT Short Term Goals - 04/28/19 2112      PT SHORT TERM GOAL #1   Title  Improve Berg score from 45/56 to >/= 49/56 for reduced fall risk.    Baseline  45/56 on 01-15-19; 52/56 on 02-12-19    Time  4    Period  Weeks    Status  Achieved    Target Date  02/15/19      PT SHORT TERM GOAL #2   Title  Improve TUG score from 14.07 to </= 12.5 secs with no device for decr. fall risk.    Baseline  14.07 secs on 01-15-19; 11.84 secs on 02-12-19    Time  4    Period  Weeks    Status  Achieved    Target Date  02/15/19      PT SHORT TERM GOAL #3   Title  Incr. gait velocity from 2.44 ft/sec to >/= 2.8 ft/sec for incr. gait  efficiency, with pt demonstrating minimal Rt trunk lean.    Baseline  13.47 secs = 2.44 ft/sec without device;  11.54secs = 2.84 ft/sec without device    Time  4    Period  Weeks    Status  Achieved    Target Date  02/15/19      PT SHORT TERM GOAL #4   Title  Incr. FGA score by at least 5 points from baseline to demonstrate improved balance with ambulation.    Baseline  Scored 20/30 on 02/16/19    Time  4    Period  Weeks    Status  Achieved    Target Date  02/15/19      PT SHORT TERM GOAL #5   Title  Independent in HEP for balance & strengthening exs.    Time  4    Period  Weeks    Status  Achieved    Target Date  02/15/19        PT Long Term Goals - 04/28/19 2115      PT LONG TERM GOAL #1   Title  Increase Berg score to >/= 52/56 for reduced fall risk. UPDATED LTG  Berg score >/= 54/56    Baseline  45/56 on 01-15-19;  52/56 on 02-12-19;  53/56 on 03-09-19    Time  4    Period  Weeks    Status  Achieved      PT LONG TERM GOAL #2   Title  Improve TUG score to </= 10 secs without device to demonstrate high mobility.    Baseline  14.07 secs without device;  7.31 secs on 03-09-19    Time  8    Period  Weeks    Status  Achieved      PT LONG TERM GOAL #3   Title  Improve FGA score by at least 8 points to demonstrate improved balance with gait and for reduced fall risk.  UPDATED LTG:  Increase FGA score from 24/30 to >/= 28/30 for increased safety with ambulation.    Baseline  24/30 on 03-09-19:  initial score 12/30 on 01-20-19; 20/30 on 02-16-19; 27/30 on 04-27-19    Time  8    Period  Weeks    Status  On-going      PT LONG TERM GOAL #4   Title  Increase gait velocity to >/=  3.2 ft/sec for incr. gait efficiency with minimal postural deviations exhibited.    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    Baseline  balance with tandem walking is improving but pt not able to perform this activity safely as of current time - goal ongoing    Time  8    Period  Weeks    Status  On-going      PT LONG TERM GOAL #6   Title  Pt will be independent in updated HEP as appropriate, including aquatic exercises.    Time  8    Period  Weeks            Plan - 05/04/19 1801    Clinical Impression Statement  Skilled aquatic session focused on balance and strength.  Pt c/o some R ankle discomfort during session but denies "pain".  Pt continues with difficulty with balance in pool especially with SLS activities and decreased BOS.    Comorbidities  h/o cervical fusion (C5-7) approx. 2012 with neck injury in Dec. 2016 (pt slipped off edge of bed):  CKD stage 3;  diabetic retinopathy bil. eyes; DM type 2:  HTN    PT Frequency  2x / week    PT Duration  4 weeks   renewal completed on 03-10-19 for 4 additional weeks   PT Treatment/Interventions  ADLs/Self Care Home Management;Aquatic Therapy;Gait training;Stair training;Functional mobility training;Therapeutic activities;Therapeutic exercise;Balance training;Neuromuscular re-education;Patient/family education    PT Next Visit Plan  aquatic therapy - cont with high level balance/vestibular exercises    PT Home Exercise Plan  Access Code: [email or web address] URL: [email or web address] Date: 01/20/2019 Prepared by: Modena Morrowenise [place]    Consulted and Agree with Plan of Care  Patient       Patient will benefit from skilled therapeutic intervention in order to improve the following deficits and impairments:     Visit Diagnosis: Unsteadiness on feet  Other abnormalities of gait and mobility     Problem List Patient Active Problem List   Diagnosis Date Noted  . CKD (chronic kidney disease) 08/28/2018  .  Retinopathy of both eyes 08/28/2018  . 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 Stroke (HCC) 08/08/2018  . Diabetes mellitus (HCC) 07/25/2015  . Neck injury 06/22/2015  . Hearing loss in right ear 10/14/2011  . Agent orange exposure 10/14/2011  . ADD (attention deficit disorder) 10/14/2011  . Hyperlipidemia 10/14/2011  . BMI 28.0-28.9,adult 10/14/2011    Newell Coralenise Terry Hazel Wrinkle, PTA [facility] 05/04/19 6:07 PM Phone: [phone] Fax: [phone]   [facility] [address] [place], [place], [address] Phone: [phone]   Fax:  [phone]  Name: Jeff [name] MRN: [id number] Date of Birth: 1951/05/03

## 2019-05-07 ENCOUNTER — Other Ambulatory Visit: Payer: Self-pay

## 2019-05-07 ENCOUNTER — Ambulatory Visit: Payer: Medicare Other | Attending: Physical Medicine & Rehabilitation | Admitting: Physical Therapy

## 2019-05-07 DIAGNOSIS — R2681 Unsteadiness on feet: Secondary | ICD-10-CM | POA: Insufficient documentation

## 2019-05-07 DIAGNOSIS — R2689 Other abnormalities of gait and mobility: Secondary | ICD-10-CM | POA: Diagnosis present

## 2019-05-10 ENCOUNTER — Encounter: Payer: Self-pay | Admitting: Physical Therapy

## 2019-05-10 NOTE — Therapy (Signed)
[facility] [address] [place], [place], [address] Phone: [phone]   Fax:  [phone]  Physical Therapy Treatment  Patient Details  Name: Jeff [name] MRN: [id number] Date of Birth: [birth year] Referring Provider (PT): Dr. Alysia Penna   Encounter Date: 05/07/2019  PT End of Session - 05/10/19 2010    Visit Number  26    Number of Visits  31    Date for PT Re-Evaluation  05/27/19    Authorization Type  [employer] Medicare    Authorization Time Period  01-15-19 - 04-15-19;  03-10-19 - 06-03-19;  04-27-19 - 06-04-19    PT Start Time  1103    PT Stop Time  1147    PT Time Calculation (min)  44 min    Equipment Utilized During Treatment  Other (comment)   noodle   Activity Tolerance  Patient tolerated treatment well    Behavior During Therapy  [facility] for tasks assessed/performed       Past Medical History:  Diagnosis Date  . ADHD (attention deficit hyperactivity disorder)   . Diabetes mellitus   . Hyperlipidemia   . Hypertension     Past Surgical History:  Procedure Laterality Date  . NECK SURGERY    . SPINE SURGERY     Cervical spine x 2; Hardin Negus; [employer].    There were no vitals filed for this visit.  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  No/denies                            Balance Exercises - 05/10/19 2006      Balance Exercises: Standing   Standing Eyes Opened  Narrow base of support (BOS);Wide (BOA);Head turns;Foam/compliant surface;5 reps    Standing Eyes Closed  Wide (BOA);Head turns;Foam/compliant surface;5 reps    Tandem Stance  Eyes open;Foam/compliant  surface   on blue balance beam   Balance Beam  standing perpendicular on blue foam balance beam - head turns slowly 5 reps side to side    Tandem Gait  Forward;4 reps   practicing partial tandem gait with UE swing 35' x 4 reps   Other Standing Exercises  resisted amb. for balance perturbations with use of sports cord for perturbations       Above resisted amb. With sports cord 115' x 2 reps  Pt practiced tandem gait - 35' x 4 reps - with SBA with pt attempting to synchronize UE swing   Tall kneeling on mat - head turns horizontally 5 reps 1/2 kneeling with CGA - horizontal head turns 5 reps each - some imbalance occurred with this position on each leg     PT Short Term Goals - 05/10/19 2014      PT SHORT TERM GOAL #1   Title  Improve Berg score from 45/56 to >/= 49/56 for reduced fall risk.    Baseline  45/56 on 01-15-19; 52/56 on 02-12-19    Time  4    Period  Weeks    Status  Achieved    Target Date  02/15/19  PT SHORT TERM GOAL #2   Title  Improve TUG score from 14.07 to </= 12.5 secs with no device for decr. fall risk.    Baseline  14.07 secs on 01-15-19; 11.84 secs on 02-12-19    Time  4    Period  Weeks    Status  Achieved    Target Date  02/15/19      PT SHORT TERM GOAL #3   Title  Incr. gait velocity from 2.44 ft/sec to >/= 2.8 ft/sec for incr. gait efficiency, with pt demonstrating minimal Rt trunk lean.    Baseline  13.47 secs = 2.44 ft/sec without device;  11.54secs = 2.84 ft/sec without device    Time  4    Period  Weeks    Status  Achieved    Target Date  02/15/19      PT SHORT TERM GOAL #4   Title  Incr. FGA score by at least 5 points from baseline to demonstrate improved balance with ambulation.    Baseline  Scored 20/30 on 02/16/19    Time  4    Period  Weeks    Status  Achieved    Target Date  02/15/19      PT SHORT TERM GOAL #5   Title  Independent in HEP for balance & strengthening exs.    Time  4    Period  Weeks    Status  Achieved     Target Date  02/15/19        PT Long Term Goals - 05/10/19 2014      PT LONG TERM GOAL #1   Title  Increase Berg score to >/= 52/56 for reduced fall risk. UPDATED LTG  Berg score >/= 54/56    Baseline  45/56 on 01-15-19;  52/56 on 02-12-19;  53/56 on 03-09-19    Time  4    Period  Weeks    Status  Achieved      PT LONG TERM GOAL #2   Title  Improve TUG score to </= 10 secs without device to demonstrate high mobility.    Baseline  14.07 secs without device;  7.31 secs on 03-09-19    Time  8    Period  Weeks    Status  Achieved      PT LONG TERM GOAL #3   Title  Improve FGA score by at least 8 points to demonstrate improved balance with gait and for reduced fall risk.  UPDATED LTG:  Increase FGA score from 24/30 to >/= 28/30 for increased safety with ambulation.    Baseline  24/30 on 03-09-19:  initial score 12/30 on 01-20-19; 20/30 on 02-16-19; 27/30 on 04-27-19    Time  8    Period  Weeks    Status  On-going      PT LONG TERM GOAL #4   Title  Increase gait velocity to >/= 3.2 ft/sec for incr. gait efficiency with minimal postural deviations exhibited.    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    Baseline  balance with tandem walking is improving but pt not able to perform this activity safely as of current time - goal ongoing    Time  8    Period  Weeks    Status  On-going      PT LONG TERM GOAL #6   Title  Pt will be independent in updated HEP as appropriate, including aquatic exercises.    Time  8    Period  Weeks            Plan - 05/10/19 2010    Clinical Impression Statement  Pt is slowly improving with performing tandem gait with synchronous UE  swing/movement needed for participation in his processional ceremonies.  Pt did perform 4 reps approx. 67' with no significant LOB.  Pt demonstrates decreased core strength as some imbalance noted with 1/2 kneeling position with head turns incorporated.    Comorbidities  h/o cervical fusion (C5-7) approx. 2012 with neck injury in Dec. 2016 (pt slipped off edge of bed):  CKD stage 3;  diabetic retinopathy bil. eyes; DM type 2:  HTN    PT Frequency  2x / week    PT Duration  4 weeks   renewal completed on 03-10-19 for 4 additional weeks   PT Treatment/Interventions  ADLs/Self Care Home Management;Aquatic Therapy;Gait training;Stair training;Functional mobility training;Therapeutic activities;Therapeutic exercise;Balance training;Neuromuscular re-education;Patient/family education    PT Next Visit Plan  aquatic therapy - cont with high level balance/vestibular exercises    PT Home Exercise Plan  Access Code: [email or web address] URL: [email or web address] Date: 01/20/2019 Prepared by: Modena Morrow    Consulted and Agree with Plan of Care  Patient       Patient will benefit from skilled therapeutic intervention in order to improve the following deficits and impairments:     Visit Diagnosis: Unsteadiness on feet  Other abnormalities of gait and mobility     Problem List Patient Active Problem List   Diagnosis Date Noted  . CKD (chronic kidney disease) 08/28/2018  . Retinopathy of both eyes 08/28/2018  . 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 Stroke (HCC) 08/08/2018  . Diabetes mellitus (HCC) 07/25/2015  . Neck injury 06/22/2015  . Hearing loss in right ear 10/14/2011  . Agent orange exposure 10/14/2011  . ADD (attention deficit disorder) 10/14/2011  . Hyperlipidemia 10/14/2011  . BMI 28.0-28.9,adult 10/14/2011    Kary Kos, PT 05/10/2019, 8:15 PM  [facility] [address] [place], [place], [address] Phone: [phone]   Fax:  [phone]  Name: RISHIKESH KHACHATRYAN MRN: [id number] Date of Birth: 10/15/1950

## 2019-05-11 ENCOUNTER — Ambulatory Visit: Payer: Medicare Other | Admitting: Physical Therapy

## 2019-05-11 ENCOUNTER — Other Ambulatory Visit: Payer: Self-pay

## 2019-05-11 DIAGNOSIS — R2689 Other abnormalities of gait and mobility: Secondary | ICD-10-CM

## 2019-05-11 DIAGNOSIS — R2681 Unsteadiness on feet: Secondary | ICD-10-CM | POA: Diagnosis not present

## 2019-05-11 NOTE — Patient Instructions (Signed)
Ankle strength - bilateral heel raises                           Unilateral heel raises - one leg only                           Heel raises in heel toe position  Ball exercise - roll ball with foot forward/back and side to side - position feet in tandem stance  Core strength - tall kneeling, 1/2 kneeling, quadruped  Heel position - move arms - alternating up/down, side, both together both ways; trunk rotation in tandem stance  Practice heel toe walking with arm movement

## 2019-05-12 ENCOUNTER — Encounter: Payer: Self-pay | Admitting: Physical Therapy

## 2019-05-12 NOTE — Therapy (Signed)
[facility] [address] [place], [place], [address] Phone: [phone]   Fax:  [phone]  Physical Therapy Treatment & Discharge Summary  Patient Details  Name: Jeff [name] MRN: [id number] Date of Birth: May 26, 1951 Referring Provider (PT): Dr. Alysia Penna   Encounter Date: 05/11/2019  PT End of Session - 05/12/19 2158    Visit Number  27    Number of Visits  31    Date for PT Re-Evaluation  05/27/19    Authorization Type  [employer]HC Medicare    Authorization Time Period  01-15-19 - 04-15-19;  03-10-19 - 06-03-19;  04-27-19 - 06-04-19    PT Start Time  0800    PT Stop Time  0845    PT Time Calculation (min)  45 min    Equipment Utilized During Treatment  Other (comment)   noodle   Activity Tolerance  Patient tolerated treatment well    Behavior During Therapy  [facility] for tasks assessed/performed       Past Medical History:  Diagnosis Date  . ADHD (attention deficit hyperactivity disorder)   . Diabetes mellitus   . Hyperlipidemia   . Hypertension     Past Surgical History:  Procedure Laterality Date  . NECK SURGERY    . SPINE SURGERY     Cervical spine x 2; Hardin Negus; [employer].    There were no vitals filed for this visit.  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  No/denies         [facility] PT Assessment - 05/12/19 0001      Berg Balance Test   Sit to Stand  Able to stand without using hands and stabilize independently    Standing Unsupported  Able to stand safely 2 minutes    Sitting with Back Unsupported but Feet Supported on Floor or Stool  Able to sit safely and securely 2 minutes    Stand to Sit  Sits safely with  minimal use of hands    Transfers  Able to transfer safely, minor use of hands    Standing Unsupported with Eyes Closed  Able to stand 10 seconds safely    Standing Unsupported with Feet Together  Able to place feet together independently and stand 1 minute safely    From Standing, Reach Forward with Outstretched Arm  Can reach confidently >25 cm (10")    From Standing Position, Pick up Object from Floor  Able to pick up shoe safely and easily    From Standing Position, Turn to Look Behind Over each Shoulder  Looks behind from both sides and weight shifts well    Turn 360 Degrees  Able to turn 360 degrees safely in 4 seconds or less    Standing Unsupported, Alternately Place Feet on Step/Stool  Able to stand independently and safely and complete 8 steps in 20 seconds    Standing Unsupported, One Foot in Front  Able to place foot tandem independently and hold 30 seconds    Standing on One Leg  Able to lift leg independently and hold 5-10 seconds    Total Score  55  Functional Gait  Assessment   Gait assessed   Yes    Gait Level Surface  Walks 20 ft in less than 5.5 sec, no assistive devices, good speed, no evidence for imbalance, normal gait pattern, deviates no more than 6 in outside of the 12 in walkway width.    Change in Gait Speed  Able to smoothly change walking speed without loss of balance or gait deviation. Deviate no more than 6 in outside of the 12 in walkway width.    Gait with Horizontal Head Turns  Performs head turns smoothly with no change in gait. Deviates no more than 6 in outside 12 in walkway width    Gait with Vertical Head Turns  Performs head turns with no change in gait. Deviates no more than 6 in outside 12 in walkway width.    Gait and Pivot Turn  Pivot turns safely within 3 sec and stops quickly with no loss of balance.    Step Over Obstacle  Is able to step over 2 stacked shoe boxes taped together (9 in total height) without changing gait speed. No evidence of  imbalance.    Gait with Narrow Base of Support  Is able to ambulate for 10 steps heel to toe with no staggering.    Gait with Eyes Closed  Walks 20 ft, uses assistive device, slower speed, mild gait deviations, deviates 6-10 in outside 12 in walkway width. Ambulates 20 ft in less than 9 sec but greater than 7 sec.    Ambulating Backwards  Walks 20 ft, no assistive devices, good speed, no evidence for imbalance, normal gait    Steps  Alternating feet, no rail.    Total Score  29        Tandem stance;  Lt foot in front 30 secs:  Rt foot in front 24.19 secs  SLS = Lt foot 7.97 secs;  Rt foot approx. 1.7 secs             [facility] Adult PT Treatment/Exercise - 05/12/19 0001      Ambulation/Gait   Ambulation/Gait  Yes    Ambulation/Gait Assistance  6: Modified independent (Device/Increase time)    Gait Pattern  Within Functional Limits    Ambulation Surface  Level;Indoor    Gait velocity  7.25 secs= 4.52 ft/sec              PT Education - 05/12/19 2158    Education Details  updated HEP to include ankle strength and core exercises - see pt instructions    Person(s) Educated  Patient    Methods  Explanation;Demonstration;Handout    Comprehension  Verbalized understanding;Returned demonstration       PT Short Term Goals - 05/12/19 2204      PT SHORT TERM GOAL #1   Title  Improve Berg score from 45/56 to >/= 49/56 for reduced fall risk.    Baseline  45/56 on 01-15-19; 52/56 on 02-12-19    Time  4    Period  Weeks    Status  Achieved    Target Date  02/15/19      PT SHORT TERM GOAL #2   Title  Improve TUG score from 14.07 to </= 12.5 secs with no device for decr. fall risk.    Baseline  14.07 secs on 01-15-19; 11.84 secs on 02-12-19    Time  4    Period  Weeks    Status  Achieved    Target Date  02/15/19  PT SHORT TERM GOAL #3   Title  Incr. gait velocity from 2.44 ft/sec to >/= 2.8 ft/sec for incr. gait efficiency, with pt demonstrating minimal Rt trunk lean.     Baseline  13.47 secs = 2.44 ft/sec without device;  11.54secs = 2.84 ft/sec without device    Time  4    Period  Weeks    Status  Achieved    Target Date  02/15/19      PT SHORT TERM GOAL #4   Title  Incr. FGA score by at least 5 points from baseline to demonstrate improved balance with ambulation.    Baseline  Scored 20/30 on 02/16/19    Time  4    Period  Weeks    Status  Achieved    Target Date  02/15/19      PT SHORT TERM GOAL #5   Title  Independent in HEP for balance & strengthening exs.    Time  4    Period  Weeks    Status  Achieved    Target Date  02/15/19        PT Long Term Goals - 05/11/19 0805      PT LONG TERM GOAL #1   Title  Increase Berg score to >/= 52/56 for reduced fall risk. UPDATED LTG  Berg score >/= 54/56    Baseline  45/56 on 01-15-19;  52/56 on 02-12-19;  53/56 on 03-09-19;  55/56 on 05-11-19    Time  4    Period  Weeks    Status  Achieved      PT LONG TERM GOAL #2   Title  Improve TUG score to </= 10 secs without device to demonstrate high mobility.    Baseline  14.07 secs without device;  7.31 secs on 03-09-19    Time  8    Period  Weeks    Status  Achieved      PT LONG TERM GOAL #3   Title  Improve FGA score by at least 8 points to demonstrate improved balance with gait and for reduced fall risk.  UPDATED LTG:  Increase FGA score from 24/30 to >/= 28/30 for increased safety with ambulation.    Baseline  24/30 on 03-09-19:  initial score 12/30 on 01-20-19; 20/30 on 02-16-19; 27/30 on 04-27-19;  FGA 27/30 05-11-19    Time  8    Period  Weeks    Status  Partially Met      PT LONG TERM GOAL #4   Title  Increase gait velocity to >/= 3.2 ft/sec for incr. gait efficiency with minimal postural deviations exhibited.    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    Baseline  balance with tandem walking is improving but pt not able to perform this activity safely as of current time - goal ongoing    Time  8    Period  Weeks    Status  Partially Met      PT LONG TERM GOAL #  6   Title  Pt will be independent in updated HEP as appropriate, including aquatic exercises.    Baseline  met 05-11-19    Time  8    Period  Weeks    Status  Achieved            Plan - 05/12/19 2159    Clinical Impression Statement  Pt has met/partially met LTG's; pt is requesting D/C from PT at this time due to concerns with risk of exposure to Covid.  Pt is progressing with tandem stance but has difficulty for performing tandem stance with UE movement as required for processional walk in the ceremonies in which he participates. Pt's Berg score is 55/56 and FGA score is 27/30 - neither of these scores is indicative of fall risk.    Comorbidities  h/o cervical fusion (C5-7) approx. 2012 with neck injury in Dec. 2016 (pt slipped off edge of bed):  CKD stage 3;  diabetic retinopathy bil. eyes; DM type 2:  HTN    PT Frequency  2x / week    PT Duration  4 weeks   renewal completed on 03-10-19 for 4 additional weeks   PT Treatment/Interventions  ADLs/Self Care Home Management;Aquatic Therapy;Gait training;Stair training;Functional mobility training;Therapeutic activities;Therapeutic exercise;Balance training;Neuromuscular re-education;Patient/family education    PT Next Visit Plan  D/C on 05-11-19 per pt's request (covid concerns)    PT Home Exercise Plan  Access Code: [email or web address] URL: [email or web address] Date: 01/20/2019 Prepared by: Nita Sells    Consulted and Agree with Plan of Care  Patient       Patient will benefit from skilled therapeutic intervention in order to improve the following deficits  and impairments:     Visit Diagnosis: Unsteadiness on feet  Other abnormalities of gait and mobility     Problem List Patient Active Problem List   Diagnosis Date Noted  . CKD (chronic kidney disease) 08/28/2018  . Retinopathy of both eyes 08/28/2018  . Stroke due to embolism of left vertebral artery ([place]) 08/12/2018  . Stroke ([place]) 08/08/2018  . Diabetes mellitus ([place]) 07/25/2015  . Neck injury 06/22/2015  . Hearing loss in right ear 10/14/2011  . Agent orange exposure 10/14/2011  . ADD (attention deficit disorder) 10/14/2011  . Hyperlipidemia 10/14/2011  . BMI 28.0-28.9,adult 10/14/2011     PHYSICAL THERAPY DISCHARGE SUMMARY  Visits from Start of Care: 27  Current functional level related to goals / functional outcomes: See above for progress towards goals   Remaining deficits: Decreased high level balance skills - decreased single limb stance with RLE more impaired than LLE: decreased tandem stance Decr. high level gait  Education / Equipment: Pt has been instructed in HEP for balance, core strengthening and ankle strengthening exercises.   Plan: Patient agrees to discharge.  Patient goals were partially met. Patient is being discharged due to the patient's request.  ?????       Pt is requesting D/C at this time due to concerns of risk of exposure to Covid.   Alda Lea, PT 05/12/2019, 10:08 PM  [facility] [address] [place], [place], [address] Phone: [phone]   Fax:  [phone]  Name: CYPRIAN GONGAWARE MRN: [id number] Date of Birth: [birth year]

## 2019-05-13 ENCOUNTER — Ambulatory Visit: Payer: Medicare Other | Admitting: Physical Therapy

## 2019-05-20 ENCOUNTER — Ambulatory Visit: Payer: Self-pay | Admitting: Physical Therapy

## 2019-05-26 ENCOUNTER — Other Ambulatory Visit: Payer: Self-pay

## 2019-05-26 ENCOUNTER — Encounter: Payer: Medicare Other | Attending: Physical Medicine & Rehabilitation | Admitting: Physical Medicine & Rehabilitation

## 2019-05-26 ENCOUNTER — Encounter: Payer: Self-pay | Admitting: Physical Medicine & Rehabilitation

## 2019-05-26 VITALS — BP 128/81 | HR 76 | Temp 97.7°F | Ht 68.0 in | Wt 202.6 lb

## 2019-05-26 DIAGNOSIS — I69398 Other sequelae of cerebral infarction: Secondary | ICD-10-CM

## 2019-05-26 DIAGNOSIS — R269 Unspecified abnormalities of gait and mobility: Secondary | ICD-10-CM

## 2019-05-26 NOTE — Progress Notes (Signed)
Subjective:    Patient ID: Jeff [name], male    DOB: 1951-05-04, 68 y.o.   MRN: [id number] 68 year old RH male with history of T2DM, HTN, ADHD who was admitted on 08/08/18 with dizziness, difficulty walking and right facial numbness.  CTA head/neck revealed left V4 occlusion, superimposed moderate left VA origin stenosis due to soft plat e, superimposed calcified ICA siphons with moderate left and moderate to severe right siphon stenosis as well as incidental findings of pseudoarthrosis of low ACDF with loosening of hardware C6/C7.  MRI brain done revealing new acute nonhemorrhagic punctate infarct left posterior lateral macula and stable atrophy with white matter disease.     2D echo showed EF of 60 to 65% with moderate thickening of AV and no wall abnormality.  Dr. Erlinda Hong felt the stroke likely due to left-VA dissection versus atherosclerosis.  Plavix was added with recommendations for DAPT x3 weeks followed by Plavix alone.  Patient with resultant dizziness, balance deficits with tendency to lean to the left as well as ataxia affecting ADLs and mobility.  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 General activity 0 Relation with others 0 Enjoyment of life 0 What TIME of day is your pain at its worst? no pain Sleep (in general) Good  Pain is worse with: no pain Pain improves with: no pain Relief from Meds: no  pain  Mobility walk without assistance ability to climb steps?  yes do you drive?  yes  Function retired  Neuro/Psych weakness numbness trouble walking dizziness  Prior Studies Any changes since last visit?  no  Physicians involved in your care Any changes since last visit?  no   Family History  Problem Relation Age of Onset  . Heart disease Mother        CHF  . Diabetes Mother   . Heart disease Father   . Cancer Brother    Social History   Socioeconomic History  . Marital status: Single    Spouse name: Not on file  . Number of children: Not on file  . Years of education: Not on file  . Highest education level: Not on file  Occupational History  . Occupation: Press photographer  Tobacco Use  . Smoking status: Never Smoker  . Smokeless tobacco: Never Used  Substance and Sexual Activity  . Alcohol use: No    Alcohol/week: 0.0 standard drinks  . Drug use: No  . Sexual activity: Yes  Other Topics Concern  . Not on file  Social History Narrative  . Not on file   Social Determinants of Health   Financial Resource Strain:   . Difficulty of Paying Living Expenses: Not on file  Food Insecurity:   . Worried About Charity fundraiser in the Last Year: Not on file  . Ran Out of Food in the Last Year: Not on file  Transportation Needs:   . Freight forwarder (Medical): Not on file  . Lack of Transportation (Non-Medical): Not on file  Physical Activity:   . Days of Exercise per Week: Not on file  . Minutes of Exercise per Session: Not on file  Stress:   . Feeling of Stress : Not on file  Social Connections:   . Frequency of Communication with Friends and Family: Not on file  . Frequency of Social Gatherings with Friends and Family: Not on file  . Attends Religious Services: Not on file  . Active Member of Clubs or Organizations: Not on file  . Attends Banker Meetings: Not on file  . Marital Status: Not on file   Past  Surgical History:  Procedure Laterality Date  . NECK SURGERY    . SPINE SURGERY     Cervical spine x 2; [employer]; [employer].   Past Medical History:  Diagnosis Date  . ADHD (attention deficit hyperactivity disorder)   . Diabetes mellitus   . Hyperlipidemia   . Hypertension    There were no vitals taken for this visit.  Opioid Risk Score:   Fall Risk Score:  `1  Depression screen PHQ 2/9  Depression screen [facility] 2/9 11/03/2018 10/30/2018 10/02/2018 12/30/2017 10/23/2017 10/02/2017 09/30/2017  Decreased Interest 0 0 0 0 0 0 0  Down, Depressed, Hopeless 0 0 0 0 0 0 0  PHQ - 2 Score 0 0 0 0 0 0 0  Altered sleeping - - 3 - - - -  Tired, decreased energy - - 3 - - - -  Change in appetite - - 0 - - - -  Feeling bad or failure about yourself  - - 0 - - - -  Trouble concentrating - - 0 - - - -  Moving slowly or fidgety/restless - - 0 - - - -  Suicidal thoughts - - 0 - - - -  PHQ-9 Score - - 6 - - - -  Some recent data might be hidden    Review of Systems  Constitutional: Negative.   HENT: Negative.   Eyes: Negative.   Respiratory: Negative.   Cardiovascular: Negative.   Gastrointestinal: Negative.   Endocrine: Negative.   Musculoskeletal: Positive for gait problem.  Skin: Negative.   Allergic/Immunologic: Negative.   Neurological: Positive for dizziness, weakness and numbness.  Hematological: Negative.   Psychiatric/Behavioral: Negative.   All other systems reviewed and are negative.      Objective:   Physical Exam Neurological:     Mental Status: He is alert and oriented to person, place, and time. Mental status is at baseline.     Cranial Nerves: No dysarthria.   remainder of exam is deferred due to phone         Assessment & Plan:  1.  Medullary infarct with higher level balance deficits but otherwise mod I for all ADLs and mobility.  No need for PMR f/u , pt in agreement  F/u PCP Dr Conni Slipper as well as Endocrinology at [facility]  F/u with Neuro Jan 5   2.  Anxiety  due to fear of recurrent CVA , f/u neuropsych 06/26/19 Dr Kieth Brightly  Duration of call 

## 2019-06-09 ENCOUNTER — Ambulatory Visit: Payer: Medicare Other | Admitting: Adult Health

## 2019-06-26 ENCOUNTER — Encounter: Payer: Medicare Other | Admitting: Psychology

## 2019-08-11 ENCOUNTER — Telehealth: Payer: Self-pay | Admitting: *Deleted

## 2019-08-11 NOTE — Telephone Encounter (Signed)
Pt need a nurse to call him, pt has question about his stroke and diabetes being related. Please call [phone] or [phone]

## 2019-08-11 NOTE — Telephone Encounter (Signed)
I called pt. He needs a medical release mailed to him for records.  He is needing information about cause of stroke, that may assist in getting disabilty from [place].

## 2019-08-19 ENCOUNTER — Telehealth: Payer: Self-pay | Admitting: Adult Health

## 2019-08-19 NOTE — Telephone Encounter (Signed)
Pt called and stated that yesterday his blood sugar dropped to 47 and he called 911 and they were able to get it back up but pt states that the way he is feeling is the way he felt right before he had his stroke and he would like to discuss with RN. Please advise.

## 2019-08-19 NOTE — Telephone Encounter (Signed)
I called pt and he had hypoglycemic episode with confusion sluggishness, blood sugar to 47-51 EMS called.  Stated to let MD's know and I relayed that symptoms that he felt most likely related to drop in blood sugar.  He has contacted pcp and endocrinology.  I made fu for stroke since last seen 02-2019 for this coming Monday 08-24-19 at 0945.  He appreciated call.

## 2019-08-20 ENCOUNTER — Telehealth: Payer: Self-pay

## 2019-08-20 NOTE — Progress Notes (Signed)
[facility] [address] [place]. [place] [address] [phone]       STROKE FOLLOW UP NOTE  Jeff [name] Date of Birth:  06-10-1950 Medical Record Number:  [id number]   Reason for Referral: stroke follow up    CHIEF COMPLAINT:  Chief Complaint  Patient presents with  . Follow-up    Stroke follow up room 9 with wife Jeff [name]     HPI:   [name] is a 69 year old male who is being seen today, 08/24/2019, for stroke follow-up accompanied by his wife as well as recent episode of disorientation associated with hypoglycemia.  Episode occurred on 3/16-acute onset of disorientation and difficulty getting words out with glucose level 47.  Evaluated by EMS receiving glucose gel and improvement of glucose level and symptoms.  He does report having prior similar episode within the past 6 months with similar symptoms and hypoglycemia.  He continues to follow with [facility] and did not recommend making any diabetic regimen changes at that time.  He attempted to follow-up with PCP to discuss recent episode but per patient, has not received call back.  He is concerned regarding possible TIA per EMS dispatcher.  Denied worsening gait/balance, dizziness, lightheadedness, worsening or new weakness or numbness/tingling, visual changes, or headache.  He has not had any recurrent symptoms since that time.    Prior residual stroke deficits of mild RUE decreased dexterity, imbalance and gait impairment.  Imbalance and gait has slightly worsened since completion of therapy in 05/2019.  He reports requested self discharge due to COVID-19 concerns.  He does report great improvement while actively participating in therapy.  He has since received both immunizations and is requesting possible restart therapies.  He does admit to lack of HEP at home.  Continues on Plavix and Crestor for secondary stroke prevention without side effects.  Does report recent lab work by [place] but unsure of results.   Blood pressure today 113/66.  He does report occasional lightheaded sensation with position changes but this has been stable.  Recent A1c 7.4 with ongoing use of Metformin and insulin regimen.  He continues to experience anxiety related to fear of recurrent stroke with insomnia and recently had a sleep study which did show evidence of sleep apnea and plans on initiating CPAP in near future.  He was referred to neuropsych by Dr. Letta Pate but unfortunately initial evaluation canceled and has not rescheduled appointment at this time.  No further concerns at this time.      History copied for reference purposes only Stroke admission 08/08/2018: Jeff [name] a 69 y.o.malewith history of HTN, ADHD, DM, HLDand currently being treated for an ulcerwho presented with dizziness, gait difficulties, right facial numbness, facial droop and word finding difficulties.  CT head negative for hemorrhage and CTA showed left vertebral V4 occlusion, heavily calcified ICA siphons with moderate left and moderate to severe right siphon stenosis.  NIHSS 4 therefore TPA administered without complication.  Initial MRI negative for acute infarct.  Repeat MRI head showed acute nonhemorrhagic punctate infarct involving the left posterolateral medulla secondary to left VA dissection versus arthrosclerosis given risk factors and ICA siphon atherosclerosis.  2D echo unremarkable.  LDL 161 and A1c 9.6.  Initiated DAPT for 3 weeks and Plavix alone.  HTN stable.  Increase Crestor dosage from 20 mg to 40 mg.  Resumed home diabetic medication and met with diabetic nurse coordinator. No prior history of stroke.  Residual deficits of dizziness, balance deficits and ataxia therefore  discharged to [facility] for ongoing therapies.  Found to have central vestibular symptoms during CIR admission.  Discharged home on 08/27/2018.  Virtual visit 11/03/2018: He continues to receive home health PT due to ongoing balance/stability difficulties but overall  improvement and uses a cane intermittently for assistance. Denies any recent falls. He continues to do exercises at home on his home including walking 4-5 miles per day 4 days weekly with his wife. He has been able to walk on uneven surfaces without difficulty.  Denies additional speech deficits and completed ST/OT. Completed 3 weeks DAPT and continues on Plavix alone without side effects of bleeding or bruising.  Continues on Crestor without side effects myalgias. He continues to be followed by [facility] provider and pharmacist for DM management with recent adjustments on insulin regimen. Blood pressure checked during todays visit at 100/62. Denies new or worsening stroke/TIA symptoms.   Update 02/04/2019: [name] is a 69 year old male who is being seen today for stroke follow-up visit accompanied by his wife.  He continues to have residual deficits of balance difficulties and stuttering of speech.  He was previously working with home health therapies with improvement of residual deficits and it was recommended to participate in therapy ongoing with outpatient but due to COVID-19 restrictions, he was unable to schedule outpatient therapy sessions initially.  He has since started outpatient physical therapy but was not receiving any therapy for approximately 2 months.  His wife does endorse some worsening of overall balance, speech and swallowing difficulties.  He will occasionally have stuttering of speech and increased clearing of throat or coughing with swallowing foods.  He plans on restarting HEP as recommended by prior home speech therapy.  His residual balance concern is greatest with uneven surfaces and occasionally on flat surfaces.  He is able to ambulate without assistive device.  Continues on clopidogrel without side effects of bleeding or bruising.  Continues on Crestor without myalgias.  Blood pressure satisfactory 113/75.  [facility] with improvement but  continues to be uncontrolled.  He was evaluated by neurologist the [facility] and plans on undergoing MRI, MRA and CT scan on September 23.  He is not sure why he is having the scans performed but per patient, scheduling assistant stated diagnosis code for ordering included possible stroke.  He denies any other stroke like symptoms or deficits that he did not previously experience.  He has also been experiencing difficulty sleeping at night as he is fearful of reoccurring stroke and not waking up in the morning.  He becomes tearful while speaking of this.  He becomes anxious when speaking about his initial presentation of symptoms and when EMS arrived to his home stating he was having a stroke.  He is not currently on any type of depression/anxiety medication or management.  He denies previously having difficulties with depression.  He denies depression or anxiety throughout the day and his only concern is sleeping at night due to fear of waking up.     ROS:   14 system review of systems performed and negative with exception of gait difficulties, imbalance, lightheadedness and anxiety    PMH:  Past Medical History:  Diagnosis Date  . ADHD (attention deficit hyperactivity disorder)   . Diabetes mellitus   . Hyperlipidemia   . Hypertension     PSH:  Past Surgical History:  Procedure Laterality Date  . NECK SURGERY    . SPINE SURGERY     Cervical spine  x 2; Hardin Negus; [employer].    Social History:  Social History   Socioeconomic History  . Marital status: Single    Spouse name: Not on file  . Number of children: Not on file  . Years of education: Not on file  . Highest education level: Not on file  Occupational History  . Occupation: Press photographer  Tobacco Use  . Smoking status: Never Smoker  . Smokeless tobacco: Never Used  Substance and Sexual Activity  . Alcohol use: No    Alcohol/week: 0.0 standard drinks  . Drug use: No  . Sexual activity: Yes  Other Topics  Concern  . Not on file  Social History Narrative  . Not on file   Social Determinants of Health   Financial Resource Strain:   . Difficulty of Paying Living Expenses:   Food Insecurity:   . Worried About Charity fundraiser in the Last Year:   . Arboriculturist in the Last Year:   Transportation Needs:   . Film/video editor (Medical):   Marland Kitchen Lack of Transportation (Non-Medical):   Physical Activity:   . Days of Exercise per Week:   . Minutes of Exercise per Session:   Stress:   . Feeling of Stress :   Social Connections:   . Frequency of Communication with Friends and Family:   . Frequency of Social Gatherings with Friends and Family:   . Attends Religious Services:   . Active Member of Clubs or Organizations:   . Attends Archivist Meetings:   Marland Kitchen Marital Status:   Intimate Partner Violence:   . Fear of Current or Ex-Partner:   . Emotionally Abused:   Marland Kitchen Physically Abused:   . Sexually Abused:     Family History:  Family History  Problem Relation Age of Onset  . Heart disease Mother        CHF  . Diabetes Mother   . Heart disease Father   . Cancer Brother     Medications:   Current Outpatient Medications on File Prior to Visit  Medication Sig Dispense Refill  . acetaminophen (TYLENOL) 325 MG tablet Take 1-2 tablets (325-650 mg total) by mouth every 4 (four) hours as needed for mild pain.    . calcium carbonate (TUMS EX) 750 MG chewable tablet Chew 2 tablets by mouth as needed for heartburn.    . clopidogrel (PLAVIX) 75 MG tablet TAKE 1 TABLET BY MOUTH DAILY 90 tablet 0  . glucose blood (ONETOUCH VERIO) test strip 1 each by Other route 4 (four) times daily -  before meals and at bedtime. (Patient taking differently: 1 each by Other route 4 (four) times daily -  before meals and at bedtime. dexcom 6) 100 each 12  . Insulin Glargine (LANTUS [place]) Inject into the skin. Take 45 units in the am and 55 at qhs    . Insulin Syringe-Needle U-100 (B-D INS SYR  ULTRAFINE 1CC/31G) 31G X 5/16" 1 ML MISC Used to inject insulin twice daily. 90 each 2  . LEVOTHYROXINE SODIUM PO Take 5 mcg by mouth.    Marland Kitchen lisinopril (ZESTRIL) 2.5 MG tablet Take 2.5 mg by mouth daily.    . metFORMIN (GLUCOPHAGE-XR) 500 MG 24 hr tablet Take 500 mg by mouth daily with breakfast.    . pantoprazole (PROTONIX) 40 MG tablet TAKE 1 TABLET BY MOUTH AT BEDTIME 90 tablet 0  . rosuvastatin (CRESTOR) 40 MG tablet TAKE 1 TABLET BY MOUTH DAILY 90 tablet 0  No current facility-administered medications on file prior to visit.    Allergies:   Allergies  Allergen Reactions  . Flexeril [Cyclobenzaprine Hcl] Other (See Comments)    Causes him to pass out and lowers his BP  . Lidocaine Swelling    throat  . Novocain [Procaine Hcl] Swelling     Physical Exam  Vitals:   08/24/19 0924  BP: 113/66  Pulse: 61  Temp: (!) 97.5 F (36.4 C)  Weight: 207 lb (93.9 kg)  Height: 5' 8" (1.727 m)   Body mass index is 31.47 kg/m. No exam data present  General: well developed, well nourished,  pleasant elderly Caucasian male, seated, in no evident distress Head: head normocephalic and atraumatic.   Neck: supple with no carotid or supraclavicular bruits Cardiovascular: regular rate and rhythm, no murmurs Musculoskeletal: no deformity Skin:  no rash/petichiae Vascular:  Normal pulses all extremities   Neurologic Exam Mental Status: Awake and fully alert.  Normal speech and language.  Oriented to place and time. Recent and remote memory intact. Attention span, concentration and fund of knowledge appropriate. Mood and affect appropriate.  Cranial Nerves: Pupils equal, briskly reactive to light. Extraocular movements full without nystagmus. Visual fields full to confrontation. Hearing intact. Facial sensation intact.  Mild right lower facial droop -stable. Motor: Normal bulk and tone. Normal strength in all tested extremity muscles  Sensory.: intact to touch , pinprick , position and  vibratory sensation.  Coordination: Decreased right hand dexterity.  Mild RUE and RLE ataxia -stable Gait and Station: Arises from chair without difficulty. Stance is normal. Gait demonstrates normal stride length with imbalance especially with making turns.  Unable to perform tandem walk.  Romberg positive. Reflexes: 1+ and symmetric. Toes downgoing.        Diagnostic Data (Labs, Imaging, Testing)  Ct Angio Head W Or Wo Contrast Ct Angio Neck W Or Wo Contrast 08/08/2018 IMPRESSION:  1. Positive forLeft Vertebral Artery V4 occlusion.The Right vertebral artery and basilar remain normal.  2. Superimposed moderate left vertebral artery origin stenosis appears related to soft plaque. 3. Heavily calcified ICA siphons with moderate Left and moderate to severe Right siphon stenosis.No carotid stenosis in the neck.  4. Moderate Left MCA M2 origin stenosis.  5. Lower cervical ACDF with pseudoarthrosis and hardware loosening at C6-C7.   Ct Head Code Stroke Wo Contrast 08/08/2018 IMPRESSION:  1. No acute finding  2. Atrophy and chronic small vessel ischemia that has progressed from 2016.   MRI Brain WO Contrast 08/10/2018 IMPRESSION: 1. No acute infarct. 2. Chronic small vessel ischemia and volume loss.   MRI Brain WO Contrast(Repeat) 08/09/2018 IMPRESSION: 1. 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 2. Otherwise stable atrophy and white matter disease.   Transthoracic Echocardiogram  08/08/2018 IMPRESSIONS 1. The left ventricle has normal systolic function with an ejection fraction of 60-65%. The cavity size was normal. Left ventricular diastolic parameters were normal. 2. The right ventricle has normal systolic function. The cavity was normal. There is no increase in right ventricular wall thickness. 3. Left atrial size was mildly dilated. 4. The mitral valve is degenerative. Mild thickening of the mitral valve leaflet. 5.  The tricuspid valve is normal in structure. 6. The aortic valve is tricuspid Moderate thickening of the aortic valve Moderate calcification of the aortic valve. 7. The pulmonic valve was normal in structure. 8. The interatrial septum was not well visualized.       ASSESSMENT: Jeff [name] is a 69 y.o.  year old male here with left cerebellum DWI negative infarct due to left ICA occlusion status post TPA on 08/09/2018 secondary to left VA occlusion versus arthrosclerosis given risk factors and ICA siphon atherosclerosis. Vascular risk factors include HTN, HLD and DM.  Residual deficits of balance/gait impairment, right-sided ataxia and right facial droop.  He does report slight worsening since holding therapy 3 months ago due to COVID-19 concerns.  He did have recent episode on 3/16 consisting of disorientation and difficulty formulating words in setting of hypoglycemia.  He was concerned regarding possible TIA and was advised to follow-up in our office for further evaluation    PLAN:  1. Transient disorientation and speech difficulty: Likely due to hypoglycemic event versus TIA though less likely.  He does report prior episode consisting of same symptoms with hypoglycemia and improvement of symptoms though at times with improvement of glucose levels.  No further work-up indicated at this time as he has not had any reoccurring events or new/worsening stroke/TIA symptoms.  Advised patient to notify office with any reoccurring events not associated with hypoglycemia for further neurologic evaluation at that time.  He will continue to follow with endocrinology at [facility] for ongoing monitoring and management. 2. Hx of Left cerebellum infarct: Continue clopidogrel 75 mg daily  and Crestor 40 mg for secondary stroke prevention. Maintain strict control of hypertension with blood pressure goal below 130/90, diabetes with hemoglobin A1c goal below 6.5% and cholesterol with LDL cholesterol (bad  cholesterol) goal below 70 mg/dL.  I also advised the patient to eat a healthy diet with plenty of whole grains, cereals, fruits and vegetables, exercise regularly with at least 30 minutes of continuous activity daily and maintain ideal body weight. 3. Gait impairment, post stroke: Therapies previously placed on hold due to COVID-19 safety concerns.  He has since received both immunizations therefore referral will be placed for additional PT for possible improvement 4. 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  Will ensure carotid ultrasound for MRA/CTA has been completed for surveillance monitoring 5. Anxiety, poststroke: Referral previously placed for evaluation by Dr. Sima Matas but unfortunately initial evaluation canceled.  Referral placed for neuropsychology.  Declines interest in medication management at this time. 6. OSA: Recently diagnosed with sleep apnea through [place] and plans on obtaining CPAP machine in the near future for management. 7. HTN: Advised to continue current treatment regimen.  Advised to continue to monitor at home along with continued follow-up with PCP for management 8. HLD: Advised to continue current treatment regimen along with continued follow-up with PCP for future prescribing and monitoring of lipid panel.  He does report recent lab work completed through [place] and will request results be faxed to office 9. DMII: Advised to continue to monitor glucose levels at home along with continued follow-up with PCP/pharmacy for management and monitoring   Follow up in 4 months or call earlier if needed   I spent 42 minutes of face-to-face and non-face-to-face time with patient and wife.  This included previsit chart review, lab review, study review, order entry, electronic health record documentation, patient education regarding recent events likely  due to hypoglycemic episode, importance of secondary stroke risk factor management and answered all questions to patient and wife satisfaction     Frann Rider, [facility]  [facility] [address] [place], [place] [address]  Phone [phone] Fax [phone] Note: This  document was prepared with digital dictation and possible smart phrase technology. Any transcriptional errors that result from this process are unintentional.

## 2019-08-24 ENCOUNTER — Encounter: Payer: Self-pay | Admitting: Adult Health

## 2019-08-24 ENCOUNTER — Ambulatory Visit: Payer: Medicare PPO | Admitting: Adult Health

## 2019-08-24 ENCOUNTER — Other Ambulatory Visit: Payer: Self-pay

## 2019-08-24 VITALS — BP 113/66 | HR 61 | Temp 97.5°F | Ht 68.0 in | Wt 207.0 lb

## 2019-08-24 DIAGNOSIS — F419 Anxiety disorder, unspecified: Secondary | ICD-10-CM

## 2019-08-24 DIAGNOSIS — E785 Hyperlipidemia, unspecified: Secondary | ICD-10-CM | POA: Diagnosis not present

## 2019-08-24 DIAGNOSIS — I69398 Other sequelae of cerebral infarction: Secondary | ICD-10-CM

## 2019-08-24 DIAGNOSIS — R2689 Other abnormalities of gait and mobility: Secondary | ICD-10-CM | POA: Diagnosis not present

## 2019-08-24 DIAGNOSIS — E119 Type 2 diabetes mellitus without complications: Secondary | ICD-10-CM | POA: Diagnosis not present

## 2019-08-24 DIAGNOSIS — I10 Essential (primary) hypertension: Secondary | ICD-10-CM | POA: Diagnosis not present

## 2019-08-24 DIAGNOSIS — Z794 Long term (current) use of insulin: Secondary | ICD-10-CM

## 2019-08-24 DIAGNOSIS — R410 Disorientation, unspecified: Secondary | ICD-10-CM | POA: Diagnosis not present

## 2019-08-24 DIAGNOSIS — I63532 Cerebral infarction due to unspecified occlusion or stenosis of left posterior cerebral artery: Secondary | ICD-10-CM

## 2019-08-24 NOTE — Patient Instructions (Signed)
You recent episode of disorientation and difficulty speaking likely due to low blood glucose levels at that time. [facility] but if you have additional episodes that are not accompanied by low blood sugar levels, please call [phone] for further evaluation and notify our office for further evaluation   We will obtain recent lab work and imaging from the [place] to follow up on imaging done during admission  Continue clopidogrel 75 mg daily  and Crestor for secondary stroke prevention  Continue to follow up with PCP regarding cholesterol and blood pressure management   [facility]   Referral will be placed to neuropsych for evaluation of ongoing anxiety  Referral placed to physical rehab at neuro rehab for ongoing stroke deficits   Continue to monitor blood pressure at home  Maintain strict control of hypertension with blood pressure goal below 130/90, diabetes with hemoglobin A1c goal below 6.5% and cholesterol with LDL cholesterol (bad cholesterol) goal below 70 mg/dL. I also advised the patient to eat a healthy diet with plenty of whole grains, cereals, fruits and vegetables, exercise regularly and maintain ideal body weight.  Followup in the future with me in 4 months or call earlier if needed       Thank you for coming to see [place] at [facility]. I hope we have been able to provide you high quality care today.  You may receive a patient satisfaction survey over the next few weeks. We would appreciate your feedback and comments so that we may continue to improve ourselves and the health of our patients.

## 2019-08-25 ENCOUNTER — Telehealth: Payer: Self-pay | Admitting: *Deleted

## 2019-08-25 DIAGNOSIS — I10 Essential (primary) hypertension: Secondary | ICD-10-CM | POA: Diagnosis not present

## 2019-08-25 DIAGNOSIS — I639 Cerebral infarction, unspecified: Secondary | ICD-10-CM | POA: Diagnosis not present

## 2019-08-25 DIAGNOSIS — N1831 Chronic kidney disease, stage 3a: Secondary | ICD-10-CM | POA: Diagnosis not present

## 2019-08-25 DIAGNOSIS — E1122 Type 2 diabetes mellitus with diabetic chronic kidney disease: Secondary | ICD-10-CM | POA: Diagnosis not present

## 2019-08-25 NOTE — Telephone Encounter (Signed)
Request faxed to [facility] [phone] requesting imaging [facility], on 08/25/2019

## 2019-08-31 ENCOUNTER — Encounter: Payer: Self-pay | Admitting: Psychology

## 2019-09-04 NOTE — Progress Notes (Signed)
I agree with the above plan 

## 2019-10-12 NOTE — Telephone Encounter (Signed)
No action needed, closing encounter

## 2019-12-18 DIAGNOSIS — Z961 Presence of intraocular lens: Secondary | ICD-10-CM | POA: Diagnosis not present

## 2019-12-18 DIAGNOSIS — E113393 Type 2 diabetes mellitus with moderate nonproliferative diabetic retinopathy without macular edema, bilateral: Secondary | ICD-10-CM | POA: Diagnosis not present

## 2019-12-18 DIAGNOSIS — H43811 Vitreous degeneration, right eye: Secondary | ICD-10-CM | POA: Diagnosis not present

## 2019-12-18 DIAGNOSIS — H538 Other visual disturbances: Secondary | ICD-10-CM | POA: Diagnosis not present

## 2019-12-18 DIAGNOSIS — H04123 Dry eye syndrome of bilateral lacrimal glands: Secondary | ICD-10-CM | POA: Diagnosis not present

## 2019-12-18 DIAGNOSIS — H53022 Refractive amblyopia, left eye: Secondary | ICD-10-CM | POA: Diagnosis not present

## 2019-12-24 ENCOUNTER — Ambulatory Visit: Payer: Medicare PPO | Admitting: Adult Health

## 2019-12-28 ENCOUNTER — Other Ambulatory Visit: Payer: Self-pay

## 2019-12-28 ENCOUNTER — Encounter (INDEPENDENT_AMBULATORY_CARE_PROVIDER_SITE_OTHER): Payer: Medicare PPO | Admitting: Ophthalmology

## 2019-12-28 DIAGNOSIS — E11319 Type 2 diabetes mellitus with unspecified diabetic retinopathy without macular edema: Secondary | ICD-10-CM | POA: Diagnosis not present

## 2019-12-28 DIAGNOSIS — H43813 Vitreous degeneration, bilateral: Secondary | ICD-10-CM

## 2019-12-28 DIAGNOSIS — H353122 Nonexudative age-related macular degeneration, left eye, intermediate dry stage: Secondary | ICD-10-CM

## 2019-12-28 DIAGNOSIS — E113393 Type 2 diabetes mellitus with moderate nonproliferative diabetic retinopathy without macular edema, bilateral: Secondary | ICD-10-CM | POA: Diagnosis not present

## 2019-12-28 DIAGNOSIS — H35033 Hypertensive retinopathy, bilateral: Secondary | ICD-10-CM

## 2019-12-28 DIAGNOSIS — I10 Essential (primary) hypertension: Secondary | ICD-10-CM | POA: Diagnosis not present

## 2019-12-30 ENCOUNTER — Ambulatory Visit: Payer: Medicare Other | Admitting: Psychology

## 2020-03-10 ENCOUNTER — Other Ambulatory Visit: Payer: Self-pay

## 2020-03-10 ENCOUNTER — Emergency Department (HOSPITAL_BASED_OUTPATIENT_CLINIC_OR_DEPARTMENT_OTHER): Payer: No Typology Code available for payment source

## 2020-03-10 ENCOUNTER — Encounter (HOSPITAL_COMMUNITY): Payer: Self-pay | Admitting: Neurology

## 2020-03-10 ENCOUNTER — Observation Stay (HOSPITAL_COMMUNITY): Payer: No Typology Code available for payment source

## 2020-03-10 ENCOUNTER — Observation Stay (HOSPITAL_COMMUNITY)
Admission: EM | Admit: 2020-03-10 | Discharge: 2020-03-11 | Disposition: A | Discharge status: Home / Self Care | Payer: No Typology Code available for payment source | Attending: Internal Medicine | Admitting: Internal Medicine

## 2020-03-10 ENCOUNTER — Emergency Department (HOSPITAL_COMMUNITY): Payer: No Typology Code available for payment source

## 2020-03-10 DIAGNOSIS — N1831 Chronic kidney disease, stage 3a: Secondary | ICD-10-CM | POA: Diagnosis not present

## 2020-03-10 DIAGNOSIS — T383X5A Adverse effect of insulin and oral hypoglycemic [antidiabetic] drugs, initial encounter: Secondary | ICD-10-CM

## 2020-03-10 DIAGNOSIS — Z20822 Contact with and (suspected) exposure to covid-19: Secondary | ICD-10-CM | POA: Diagnosis not present

## 2020-03-10 DIAGNOSIS — Z794 Long term (current) use of insulin: Secondary | ICD-10-CM | POA: Diagnosis not present

## 2020-03-10 DIAGNOSIS — I6782 Cerebral ischemia: Secondary | ICD-10-CM | POA: Diagnosis not present

## 2020-03-10 DIAGNOSIS — Z23 Encounter for immunization: Secondary | ICD-10-CM | POA: Diagnosis not present

## 2020-03-10 DIAGNOSIS — E039 Hypothyroidism, unspecified: Secondary | ICD-10-CM | POA: Diagnosis not present

## 2020-03-10 DIAGNOSIS — R29818 Other symptoms and signs involving the nervous system: Secondary | ICD-10-CM | POA: Diagnosis not present

## 2020-03-10 DIAGNOSIS — Z79899 Other long term (current) drug therapy: Secondary | ICD-10-CM | POA: Insufficient documentation

## 2020-03-10 DIAGNOSIS — I10 Essential (primary) hypertension: Secondary | ICD-10-CM

## 2020-03-10 DIAGNOSIS — G459 Transient cerebral ischemic attack, unspecified: Secondary | ICD-10-CM | POA: Diagnosis not present

## 2020-03-10 DIAGNOSIS — I129 Hypertensive chronic kidney disease with stage 1 through stage 4 chronic kidney disease, or unspecified chronic kidney disease: Secondary | ICD-10-CM | POA: Insufficient documentation

## 2020-03-10 DIAGNOSIS — I6523 Occlusion and stenosis of bilateral carotid arteries: Secondary | ICD-10-CM | POA: Diagnosis not present

## 2020-03-10 DIAGNOSIS — E119 Type 2 diabetes mellitus without complications: Secondary | ICD-10-CM | POA: Diagnosis not present

## 2020-03-10 DIAGNOSIS — E160 Drug-induced hypoglycemia without coma: Secondary | ICD-10-CM | POA: Diagnosis not present

## 2020-03-10 DIAGNOSIS — R531 Weakness: Secondary | ICD-10-CM | POA: Diagnosis present

## 2020-03-10 DIAGNOSIS — R4701 Aphasia: Secondary | ICD-10-CM | POA: Diagnosis not present

## 2020-03-10 DIAGNOSIS — R200 Anesthesia of skin: Secondary | ICD-10-CM | POA: Diagnosis not present

## 2020-03-10 DIAGNOSIS — E1122 Type 2 diabetes mellitus with diabetic chronic kidney disease: Secondary | ICD-10-CM | POA: Diagnosis not present

## 2020-03-10 DIAGNOSIS — R29810 Facial weakness: Secondary | ICD-10-CM | POA: Diagnosis not present

## 2020-03-10 HISTORY — DX: Cerebral infarction, unspecified: I63.9

## 2020-03-10 LAB — DIFFERENTIAL
Abs Immature Granulocytes: 0.02 10*3/uL (ref 0.00–0.07)
Basophils Absolute: 0.1 10*3/uL (ref 0.0–0.1)
Basophils Relative: 1 %
Eosinophils Absolute: 0.3 10*3/uL (ref 0.0–0.5)
Eosinophils Relative: 5 %
Immature Granulocytes: 0 %
Lymphocytes Relative: 26 %
Lymphs Abs: 1.7 10*3/uL (ref 0.7–4.0)
Monocytes Absolute: 0.6 10*3/uL (ref 0.1–1.0)
Monocytes Relative: 10 %
Neutro Abs: 3.8 10*3/uL (ref 1.7–7.7)
Neutrophils Relative %: 58 %

## 2020-03-10 LAB — COMPREHENSIVE METABOLIC PANEL
ALT: 27 U/L (ref 0–44)
AST: 27 U/L (ref 15–41)
Albumin: 3.9 g/dL (ref 3.5–5.0)
Alkaline Phosphatase: 47 U/L (ref 38–126)
Anion gap: 12 (ref 5–15)
BUN: 13 mg/dL (ref 8–23)
CO2: 20 mmol/L — ABNORMAL LOW (ref 22–32)
Calcium: 8.8 mg/dL — ABNORMAL LOW (ref 8.9–10.3)
Chloride: 106 mmol/L (ref 98–111)
Creatinine, Ser: 1.48 mg/dL — ABNORMAL HIGH (ref 0.61–1.24)
GFR calc non Af Amer: 47 mL/min — ABNORMAL LOW (ref >60)
Glucose, Bld: 166 mg/dL — ABNORMAL HIGH (ref 70–99)
Potassium: 4.1 mmol/L (ref 3.5–5.1)
Sodium: 138 mmol/L (ref 135–145)
Total Bilirubin: 0.6 mg/dL (ref 0.3–1.2)
Total Protein: 6.7 g/dL (ref 6.5–8.1)

## 2020-03-10 LAB — CBC
HCT: 42.4 % (ref 39.0–52.0)
Hemoglobin: 14 g/dL (ref 13.0–17.0)
MCH: 28.9 pg (ref 26.0–34.0)
MCHC: 33 g/dL (ref 30.0–36.0)
MCV: 87.6 fL (ref 80.0–100.0)
Platelets: 201 10*3/uL (ref 150–400)
RBC: 4.84 MIL/uL (ref 4.22–5.81)
RDW: 13.4 % (ref 11.5–15.5)
WBC: 6.5 10*3/uL (ref 4.0–10.5)
nRBC: 0 % (ref 0.0–0.2)

## 2020-03-10 LAB — ECHOCARDIOGRAM COMPLETE
AR max vel: 2.52 cm2
AV Area VTI: 2.29 cm2
AV Area mean vel: 2.3 cm2
AV Mean grad: 4 mmHg
AV Peak grad: 6.9 mmHg
Ao pk vel: 1.32 m/s
Area-P 1/2: 2.91 cm2
S' Lateral: 3 cm
Weight: 3379.21 oz

## 2020-03-10 LAB — GLUCOSE, CAPILLARY: Glucose-Capillary: 87 mg/dL (ref 70–99)

## 2020-03-10 LAB — HEMOGLOBIN A1C
Hgb A1c MFr Bld: 6.9 % — ABNORMAL HIGH (ref 4.8–5.6)
Mean Plasma Glucose: 151.33 mg/dL

## 2020-03-10 LAB — CBG MONITORING, ED
Glucose-Capillary: 171 mg/dL — ABNORMAL HIGH (ref 70–99)
Glucose-Capillary: 189 mg/dL — ABNORMAL HIGH (ref 70–99)
Glucose-Capillary: 124 mg/dL — ABNORMAL HIGH (ref 70–99)

## 2020-03-10 LAB — I-STAT CHEM 8, ED
BUN: 14 mg/dL (ref 8–23)
Calcium, Ion: 1.08 mmol/L — ABNORMAL LOW (ref 1.15–1.40)
Chloride: 105 mmol/L (ref 98–111)
Creatinine, Ser: 1.5 mg/dL — ABNORMAL HIGH (ref 0.61–1.24)
Glucose, Bld: 164 mg/dL — ABNORMAL HIGH (ref 70–99)
HCT: 39 % (ref 39.0–52.0)
Hemoglobin: 13.3 g/dL (ref 13.0–17.0)
Potassium: 4.1 mmol/L (ref 3.5–5.1)
Sodium: 139 mmol/L (ref 135–145)
TCO2: 18 mmol/L — ABNORMAL LOW (ref 22–32)

## 2020-03-10 LAB — PROTIME-INR
INR: 1 (ref 0.8–1.2)
Prothrombin Time: 13.2 seconds (ref 11.4–15.2)

## 2020-03-10 LAB — RESPIRATORY PANEL BY RT PCR (FLU A&B, COVID)
Influenza A by PCR: NEGATIVE
Influenza B by PCR: NEGATIVE
SARS Coronavirus 2 by RT PCR: NEGATIVE

## 2020-03-10 LAB — APTT: aPTT: 29 seconds (ref 24–36)

## 2020-03-10 MED ORDER — INSULIN DETEMIR 100 UNIT/ML ~~LOC~~ SOLN
25.0000 [IU] | Freq: Two times a day (BID) | SUBCUTANEOUS | Status: DC
  Administered 2020-03-10 – 2020-03-11 (×2): 25 [IU] via SUBCUTANEOUS
  Filled 2020-03-10 (×3): qty 0.25

## 2020-03-10 MED ORDER — INFLUENZA VAC A&B SA ADJ QUAD 0.5 ML IM PRSY
0.5000 mL | PREFILLED_SYRINGE | INTRAMUSCULAR | Status: AC
  Administered 2020-03-11: 0.5 mL via INTRAMUSCULAR
  Filled 2020-03-10: qty 0.5

## 2020-03-10 MED ORDER — IOHEXOL 350 MG/ML SOLN
50.0000 mL | Freq: Once | INTRAVENOUS | Status: AC | PRN
  Administered 2020-03-10: 50 mL via INTRAVENOUS

## 2020-03-10 MED ORDER — SENNOSIDES-DOCUSATE SODIUM 8.6-50 MG PO TABS: 1.0000 | ORAL_TABLET | Freq: Every evening | ORAL | Status: DC | PRN

## 2020-03-10 MED ORDER — STROKE: EARLY STAGES OF RECOVERY BOOK: Freq: Once | Status: DC

## 2020-03-10 MED ORDER — PERFLUTREN LIPID MICROSPHERE
1.0000 mL | INTRAVENOUS | Status: AC | PRN
  Administered 2020-03-10: 3 mL via INTRAVENOUS
  Filled 2020-03-10: qty 10

## 2020-03-10 MED ORDER — ACETAMINOPHEN 160 MG/5ML PO SOLN: 650.0000 mg | ORAL | Status: DC | PRN

## 2020-03-10 MED ORDER — SODIUM CHLORIDE 0.9 % IV SOLN: INTRAVENOUS | Status: DC

## 2020-03-10 MED ORDER — INSULIN ASPART 100 UNIT/ML ~~LOC~~ SOLN: 0.0000 [IU] | Freq: Every day | SUBCUTANEOUS | Status: DC

## 2020-03-10 MED ORDER — ACETAMINOPHEN 650 MG RE SUPP: 650.0000 mg | RECTAL | Status: DC | PRN

## 2020-03-10 MED ORDER — ACETAMINOPHEN 325 MG PO TABS: 650.0000 mg | ORAL_TABLET | ORAL | Status: DC | PRN

## 2020-03-10 MED ORDER — SODIUM CHLORIDE 0.9% FLUSH: 3.0000 mL | Freq: Once | INTRAVENOUS | Status: DC

## 2020-03-10 MED ORDER — ENOXAPARIN SODIUM 40 MG/0.4ML ~~LOC~~ SOLN: 40.0000 mg | SUBCUTANEOUS | Status: DC

## 2020-03-10 MED ORDER — INSULIN ASPART 100 UNIT/ML ~~LOC~~ SOLN
0.0000 [IU] | Freq: Three times a day (TID) | SUBCUTANEOUS | Status: DC
  Administered 2020-03-11: 5 [IU] via SUBCUTANEOUS
  Administered 2020-03-11: 3 [IU] via SUBCUTANEOUS

## 2020-03-10 NOTE — Progress Notes (Signed)
  Echocardiogram 2D Echocardiogram has been performed with [facility].  Gerda Diss 03/10/2020, 6:01 PM

## 2020-03-10 NOTE — ED Notes (Signed)
Bed listed as blocked. Report attempted.  No answer when unit called. CN notified.

## 2020-03-10 NOTE — H&P (Signed)
History and Physical    Jeff [name]:[id number] DOB: 08-27-1950 DOA: 03/10/2020  PCP: Shade Flood, MD Patient coming from: Home  Chief Complaint: Difficulty speaking, left arm weakness  HPI: Jeff [name] is a 69 y.o. male with history of CVA on Plavix, IDDM-2, HTN, HLD, CKD-3A and obesity brought to ED by Jeff [name] with strokelike symptoms.  History provided by patient and patient's daughter over the phone. Patient was with his family at a restaurant eating lunch when he suddenly became "emotional" and started like crying.  He could not talk but pointed to his face.  He nodded yes when his daughter asked about facial numbness.  He also could not lift his right arm.  Family called EMS and he was brought to ED.  Patient's blood glucose was 48 on blood glucose monitor. They gave him some juice.  Repeat CBG by EMS was 140.  Per EMS report EDP, symptoms resolved in route to ED at about 1230.   In ED, code stroke activated.  NIHSS score 0.  CT head without contrast without acute finding.  CTA head and neck without large vessel obstruction.  Neurology did not feel TPA was indicated but recommended admission for MRI brain, echocardiogram and close monitoring.  Hospitalist service called for admission.  Patient denies headache, vision change, focal neuro symptoms, chest pain, dyspnea, URI symptoms, GI or UTI symptoms.  Patient denies ever smoking cigarettes.  Denies drinking alcohol recreational drug use.  He says he is DNR/DNI.  ROS All review of system negative except for pertinent positives and negatives as history of present illness above.  PMH Past Medical History:  Diagnosis Date  . ADHD (attention deficit hyperactivity disorder)   . Diabetes mellitus   . Hyperlipidemia   . Hypertension   . Stroke [facility])    PSH Past Surgical History:  Procedure Laterality Date  . NECK SURGERY    . SPINE SURGERY     Cervical spine x 2; [employer]; [employer].   Fam HX Family History  Problem Relation  Age of Onset  . Heart disease Mother        CHF  . Diabetes Mother   . Heart disease Father   . Cancer Brother     Social Hx  reports that he has never smoked. He has never used smokeless tobacco. He reports that he does not drink alcohol and does not use drugs.  Allergy Allergies  Allergen Reactions  . Flexeril [Cyclobenzaprine Hcl] Other (See Comments)    Causes him to pass out and lowers his BP  . Lidocaine Swelling    throat  . Novocain [Procaine Hcl] Swelling   Home Meds Prior to Admission medications   Medication Sig Start Date End Date Taking? Authorizing Provider  acetaminophen (TYLENOL) 325 MG tablet Take 1-2 tablets (325-650 mg total) by mouth every 4 (four) hours as needed for mild pain. 08/27/18   Nobrega, Evlyn Kanner, PA-C  calcium carbonate (TUMS EX) 750 MG chewable tablet Chew 2 tablets by mouth as needed for heartburn.    [provider]  clopidogrel (PLAVIX) 75 MG tablet TAKE 1 TABLET BY MOUTH DAILY 08/28/18   Myles Lipps, MD  glucose blood (ONETOUCH VERIO) test strip 1 each by Other route 4 (four) times daily -  before meals and at bedtime. Patient taking differently: 1 each by Other route 4 (four) times daily -  before meals and at bedtime. dexcom 6 08/27/18   Solan, Evlyn Kanner, PA-C  Insulin Glargine (LANTUS  [place]) Inject into the skin. Take 45 units in the am and 55 at qhs    [provider]  Insulin Syringe-Needle U-100 (B-D INS SYR ULTRAFINE 1CC/31G) 31G X 5/16" 1 ML MISC Used to inject insulin twice daily. 08/16/17   Romero Belling, MD  LEVOTHYROXINE SODIUM PO Take 5 mcg by mouth.    [provider]  lisinopril (ZESTRIL) 2.5 MG tablet Take 2.5 mg by mouth daily.    [provider]  metFORMIN (GLUCOPHAGE-XR) 500 MG 24 hr tablet Take 500 mg by mouth daily with breakfast.    [provider]  pantoprazole (PROTONIX) 40 MG tablet TAKE 1 TABLET BY MOUTH AT BEDTIME 08/28/18   Myles Lipps, MD  rosuvastatin (CRESTOR) 40 MG tablet  TAKE 1 TABLET BY MOUTH DAILY 08/28/18   Myles Lipps, MD    Physical Exam: Vitals:   03/10/20 1307 03/10/20 1315 03/10/20 1320 03/10/20 1330  BP: 129/80 137/80  130/81  Pulse: 73 72 71 70  Resp: 14 (!) 8 (!) 9 11  Temp: 98.1 F (36.7 C)     TempSrc: Oral     SpO2: 99% 99% 97%   Weight:        GENERAL: No acute distress.  Appears well.  HEENT: MMM.  Vision and hearing grossly intact.  NECK: Supple.  No apparent JVD.  RESP: On room air.  No IWOB. Good air movement bilaterally. CVS:  RRR. Heart sounds normal.  ABD/GI/GU: Bowel sounds present. Soft. Non tender.  MSK/EXT:  Moves extremities. No apparent deformity or edema.  SKIN: no apparent skin lesion or wound NEURO: Awake, alert and oriented appropriately.  No gross deficit.  PSYCH: Calm. Normal affect.   Personally Reviewed Radiological Exams CT HEAD CODE STROKE WO CONTRAST  Result Date: 03/10/2020 CLINICAL DATA:  Code stroke.  Abnormal gaze and aphasia EXAM: CT HEAD WITHOUT CONTRAST TECHNIQUE: Contiguous axial images were obtained from the base of the skull through the vertex without intravenous contrast. COMPARISON:  March 2020 FINDINGS: Brain: No acute intracranial hemorrhage, mass effect, or edema. No new loss of gray-white differentiation. Prominence of the ventricles and sulci reflects stable parenchymal volume loss. Patchy and confluent areas of hypoattenuation in the supratentorial white matter is nonspecific but probably reflects similar moderate chronic microvascular ischemic changes. No extra-axial fluid collection. Vascular: No hyperdense vessel. There is intracranial atherosclerotic calcification at the skull base. Skull: Unremarkable. Sinuses/Orbits: No acute abnormality. Other: Mastoid air cells are clear. ASPECTS ([place] Stroke Program Early CT Score) - Ganglionic level infarction (caudate, lentiform nuclei, internal capsule, insula, M1-M3 cortex): 7 - Supraganglionic infarction (M4-M6 cortex): 3 Total score (0-10  with 10 being normal): 10 IMPRESSION: No acute intracranial hemorrhage or evidence of acute infarction. ASPECT score is 10. These results were communicated to Dr. Otelia Limes at 12:54 pm on 03/10/2020 by text page via the [facility] messaging system. Electronically Signed   By: Guadlupe Spanish M.D.   On: 03/10/2020 12:58   CT ANGIO HEAD CODE STROKE  Result Date: 03/10/2020 CLINICAL DATA:  69 year old male with abnormal gaze and aphasia. EXAM: CT ANGIOGRAPHY HEAD AND NECK TECHNIQUE: Multidetector CT imaging of the head and neck was performed using the standard protocol during bolus administration of intravenous contrast. Multiplanar CT image reconstructions and MIPs were obtained to evaluate the vascular anatomy. Carotid stenosis measurements (when applicable) are obtained utilizing NASCET criteria, using the distal internal carotid diameter as the denominator. CONTRAST:  57mL OMNIPAQUE IOHEXOL 350 MG/ML SOLN COMPARISON:  Plain head CT 1252 hours  today. Brain MRI 08/10/2018. CTA head and neck 08/08/2018. FINDINGS: CTA NECK Skeleton: Unchanged prior cervical ACDF including anteriorly protruding C7 screws. No acute osseous abnormality identified. Upper chest: No acute findings. Other neck: No acute findings. Aortic arch: Stable 3 vessel arch with minimal atherosclerosis. Right carotid system: Stable and patent with minor soft plaque at the bifurcation, no stenosis. Left carotid system: Stable and patent with minimal soft plaque at the left ICA origin. Vertebral arteries: Stable proximal right subclavian artery and right vertebral artery origin with minimal plaque and no significant stenosis. Dominant right vertebral artery with stable caliber and no stenosis to the skull base. Stable proximal left subclavian artery with only mild plaque and no stenosis. But suspected moderate stenosis at the left vertebral artery origin due to soft plaque (series 8, image 130) is stable. The left vertebral remains patent to the skull base  without additional stenosis. CTA HEAD Posterior circulation: Left V4 segment occlusion redemonstrated. Left PICA seems to remain patent via collaterals as before. Faint reconstitution of the left vertebrobasilar junction is unchanged, and this appears to be associated with a fenestrated proximal basilar. Stable dominant appearing distal right vertebral artery with mild calcified plaque but no stenosis. Patent right PICA origin which is somewhat early. Basilar artery is stable and patent with mild distal basilar irregularity, no significant stenosis. SCA and PCA origins remain patent. But there is new moderate right P1 stenosis on series 9, image 27. Stable left P1. Stable superimposed mild bilateral PCA P2 irregularity and stenosis, greater on the right. Anterior circulation: Stable ICA siphons which are heavily calcified. On the left moderate to severe anterior genu and supraclinoid stenosis is stable. On the right severe cavernous segment and supraclinoid segment stenoses appears stable (series 8, image 109 and 111). Carotid termini, MCA and ACA origins are stable and patent. Dominant right A1. Bilateral ACA branches are stable. Normal anterior communicating artery with median artery of the corpus callosum. Left MCA M1 segment and trifurcation are patent without stenosis. Left MCA branches are stable with mild irregularity. Right MCA M1 segment and trifurcation are patent without stenosis. Right MCA branches are stable with mild irregularity. Venous sinuses: Early contrast timing, but the major dural venous sinuses appear to be patent. The right transverse and sigmoid are dominant as before. Anatomic variants: Dominant right vertebral artery. Dominant right ACA A1. Review of the MIP images confirms the above findings IMPRESSION: 1. Negative for large vessel occlusion 2. Positive for stable advanced ICA siphon atherosclerosis with Severe Right > Left Siphon stenosis. 3. Stable occluded Left Vertebral Artery V4  segment. Left PICA remains patent. 4. New Moderate stenosis of the Right PCA P1 segment. 5. Otherwise stable since the 2020 CTA, intracranial > extracranial atherosclerosis. These results were communicated to Dr. Otelia Limes at 1:19 pmon 10/7/2021by text page via the [facility] messaging system. Electronically Signed   By: Odessa Fleming M.D.   On: 03/10/2020 13:20   CT ANGIO NECK CODE STROKE  Result Date: 03/10/2020 CLINICAL DATA:  69 year old male with abnormal gaze and aphasia. EXAM: CT ANGIOGRAPHY HEAD AND NECK TECHNIQUE: Multidetector CT imaging of the head and neck was performed using the standard protocol during bolus administration of intravenous contrast. Multiplanar CT image reconstructions and MIPs were obtained to evaluate the vascular anatomy. Carotid stenosis measurements (when applicable) are obtained utilizing NASCET criteria, using the distal internal carotid diameter as the denominator. CONTRAST:  6mL OMNIPAQUE IOHEXOL 350 MG/ML SOLN COMPARISON:  Plain head CT 1252 hours today. Brain MRI 08/10/2018. CTA  head and neck 08/08/2018. FINDINGS: CTA NECK Skeleton: Unchanged prior cervical ACDF including anteriorly protruding C7 screws. No acute osseous abnormality identified. Upper chest: No acute findings. Other neck: No acute findings. Aortic arch: Stable 3 vessel arch with minimal atherosclerosis. Right carotid system: Stable and patent with minor soft plaque at the bifurcation, no stenosis. Left carotid system: Stable and patent with minimal soft plaque at the left ICA origin. Vertebral arteries: Stable proximal right subclavian artery and right vertebral artery origin with minimal plaque and no significant stenosis. Dominant right vertebral artery with stable caliber and no stenosis to the skull base. Stable proximal left subclavian artery with only mild plaque and no stenosis. But suspected moderate stenosis at the left vertebral artery origin due to soft plaque (series 8, image 130) is stable. The left  vertebral remains patent to the skull base without additional stenosis. CTA HEAD Posterior circulation: Left V4 segment occlusion redemonstrated. Left PICA seems to remain patent via collaterals as before. Faint reconstitution of the left vertebrobasilar junction is unchanged, and this appears to be associated with a fenestrated proximal basilar. Stable dominant appearing distal right vertebral artery with mild calcified plaque but no stenosis. Patent right PICA origin which is somewhat early. Basilar artery is stable and patent with mild distal basilar irregularity, no significant stenosis. SCA and PCA origins remain patent. But there is new moderate right P1 stenosis on series 9, image 27. Stable left P1. Stable superimposed mild bilateral PCA P2 irregularity and stenosis, greater on the right. Anterior circulation: Stable ICA siphons which are heavily calcified. On the left moderate to severe anterior genu and supraclinoid stenosis is stable. On the right severe cavernous segment and supraclinoid segment stenoses appears stable (series 8, image 109 and 111). Carotid termini, MCA and ACA origins are stable and patent. Dominant right A1. Bilateral ACA branches are stable. Normal anterior communicating artery with median artery of the corpus callosum. Left MCA M1 segment and trifurcation are patent without stenosis. Left MCA branches are stable with mild irregularity. Right MCA M1 segment and trifurcation are patent without stenosis. Right MCA branches are stable with mild irregularity. Venous sinuses: Early contrast timing, but the major dural venous sinuses appear to be patent. The right transverse and sigmoid are dominant as before. Anatomic variants: Dominant right vertebral artery. Dominant right ACA A1. Review of the MIP images confirms the above findings IMPRESSION: 1. Negative for large vessel occlusion 2. Positive for stable advanced ICA siphon atherosclerosis with Severe Right > Left Siphon stenosis. 3.  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 Left PICA remains patent. 4. New Moderate stenosis of the Right PCA P1 segment. 5. Otherwise stable since the 2020 CTA, intracranial > extracranial atherosclerosis. These results were communicated to Dr. Otelia LimesLindzen at 1:19 pmon 10/7/2021by text page via the [facility] messaging system. Electronically Signed   By: Odessa FlemingH  Hall M.D.   On: 03/10/2020 13:20     Personally Reviewed Labs: CBC: Recent Labs  Lab 03/10/20 1238 03/10/20 1244  WBC 6.5  --   NEUTROABS 3.8  --   HGB 14.0 13.3  HCT 42.4 39.0  MCV 87.6  --   PLT 201  --    Basic Metabolic Panel: Recent Labs  Lab 03/10/20 1238 03/10/20 1244  NA 138 139  K 4.1 4.1  CL 106 105  CO2 20*  --   GLUCOSE 166* 164*  BUN 13 14  CREATININE 1.48* 1.50*  CALCIUM 8.8*  --    GFR: CrCl cannot be calculated (Unknown  ideal weight.). Liver Function Tests: Recent Labs  Lab 03/10/20 1238  AST 27  ALT 27  ALKPHOS 47  BILITOT 0.6  PROT 6.7  ALBUMIN 3.9   No results for input(s): LIPASE, AMYLASE in the last 168 hours. No results for input(s): AMMONIA in the last 168 hours. Coagulation Profile: Recent Labs  Lab 03/10/20 1238  INR 1.0   Cardiac Enzymes: No results for input(s): CKTOTAL, CKMB, CKMBINDEX, TROPONINI in the last 168 hours. BNP (last 3 results) No results for input(s): PROBNP in the last 8760 hours. HbA1C: No results for input(s): HGBA1C in the last 72 hours. CBG: Recent Labs  Lab 03/10/20 1238 03/10/20 1342  GLUCAP 171* 189*   Lipid Profile: No results for input(s): CHOL, HDL, LDLCALC, TRIG, CHOLHDL, LDLDIRECT in the last 72 hours. Thyroid Function Tests: No results for input(s): TSH, T4TOTAL, FREET4, T3FREE, THYROIDAB in the last 72 hours. Anemia Panel: No results for input(s): VITAMINB12, FOLATE, FERRITIN, TIBC, IRON, RETICCTPCT in the last 72 hours. Urine analysis:    Component Value Date/Time   BILIRUBINUR negative 07/29/2018 0930   BILIRUBINUR negative  01/16/2017 1553   KETONESUR negative 07/29/2018 0930   PROTEINUR negative 07/29/2018 0930   PROTEINUR negative 01/16/2017 1553   UROBILINOGEN 0.2 07/29/2018 0930   NITRITE Negative 07/29/2018 0930   NITRITE negative 01/16/2017 1553   LEUKOCYTESUR Negative 07/29/2018 0930    Sepsis Labs:  None  Personally Reviewed EKG:  12-lead EKG NSR without significant finding.  Assessment/Plan Transient ischemic attack vs CVA: Likely due to hypoglycemia. Reportedly CBG 48 on his monitor but resolved.  His neuro symptoms resolve as well. NIHSS score 0. CTH and CTA head and neck with new moderate stenosis of right PCA P1 segment and stable arthrosclerotic changes but no large vessel occlusion.  Patient is already on Plavix.  Reports good compliance with his medication. -Neurology following -MRI brain without contrast and echocardiogram -Frequent neuro monitoring -Risk stratification labs -Continue Plavix and statin  History of CVA on Plavix -Management as above  IDDM-2 with hypoglycemia and hyperglycemia: On Lantus, SSI and Metformin Recent Labs  Lab 03/10/20 1238 03/10/20 1342  GLUCAP 171* 189*  -Check hemoglobin A1c -Lantus 25 units twice daily -SSI moderate -Continue home statin   Essential hypertension: Normotensive -Continue home meds.  Doubt need for permissive hypertension.  CKD-3A: Stable.  Renal function at baseline. -Continue monitoring  Hypothyroidism -Continue home Synthroid  Obesity: BMI 32.11. -Encourage lifestyle change to lose weight -Could benefit from GLP-1 inhibitors.  DNR/DNI  DVT prophylaxis: Subcu Lovenox Code Status: DNR/DNI Family Communication: Updated patient's wife at bedside and daughter over the phone.  Disposition Plan: Medical telemetry Consults called: Neurology Admission status: Observation   Almon Hercules MD [facility]  If 7PM-7AM, please contact night-coverage [email or web address]  03/10/2020, 2:30 PM

## 2020-03-10 NOTE — Procedures (Signed)
Echo attempted. Patient not in room in ED. Will attempt again later.

## 2020-03-10 NOTE — Code Documentation (Signed)
Stroke Response Nurse Documentation Code Documentation  Jeff [name] is a 69 y.o. male arriving to [facility] ED via [facility] on 03/10/2020 with past medical hx of DM, HTN, HLD, CVA (L vertebral occlusion), CKD. Code stroke was activated by EMS. Patient from a restaurant where he was eating lunch with family and had witnessed onset of symptoms at 1206 of aphasia and vision changes. Per EMS symptoms resolved en route at 1230. On clopidogrel 75 mg daily following a previous CVA in 08/2018. Stroke team at the bedside on patient arrival. Labs drawn and patient cleared for CT by EDP Dr. Rosalia Hammers. Patient to CT with team. NIHSS 0 on arrival but symptoms are waxing and waning minute to minute, see documentation for details and code stroke times. The following imaging was completed: CT, CTA head and neck. Patient is not a candidate for tPA due to symptom resolution patient is too good to treat at this time. Patient is a TIA alert. Care/Plan: q74m VS/q42m neuro checks until patient is outside of tPA window at 1636. Per Dr. Otelia Limes we will retain original LKW d/t some on-going weakness on his exam. Bedside handoff with ED RN Jeannett Senior.    Add-on: on re-assessment of patient he informed me that his continuous BG monitor alarmed a BG of 48. Per patient his daughter immediately brought him a soda which he drank. Symptom onset was immediately following the soda. CBG o/a 171.  Sinda Du Caylyn Tedeschi  Stroke Response RN

## 2020-03-10 NOTE — ED Provider Notes (Signed)
[facility] EMERGENCY DEPARTMENT Provider Note   CSN: [id number] Arrival date & time: 03/10/20  1236     History No chief complaint on file.   MAURICE FOTHERINGHAM is a 69 y.o. male.  HPI 5 caveat secondary to stroke 70 year old male presents as code stroke via EMS.  Last known normal 1203.  Patient had right-sided weakness and aphasia.  Symptoms resolved in route with EMS at 1230.  Patient reportedly back to baseline at this time.  Patient initially at the bridge with stroke team and taken immediately to CT scan    Past Medical History:  Diagnosis Date  . ADHD (attention deficit hyperactivity disorder)   . Diabetes mellitus   . Hyperlipidemia   . Hypertension   . Stroke [facility])     Patient Active Problem List   Diagnosis Date Noted  . CKD (chronic kidney disease) 08/28/2018  . Retinopathy of both eyes 08/28/2018  . 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 Stroke (HCC) 08/08/2018  . Diabetes mellitus (HCC) 07/25/2015  . Neck injury 06/22/2015  . Hearing loss in right ear 10/14/2011  . Agent orange exposure 10/14/2011  . ADD (attention deficit disorder) 10/14/2011  . Hyperlipidemia 10/14/2011  . BMI 28.0-28.9,adult 10/14/2011    Past Surgical History:  Procedure Laterality Date  . NECK SURGERY    . SPINE SURGERY     Cervical spine x 2; Vear Clock; [employer].       Family History  Problem Relation Age of Onset  . Heart disease Mother        CHF  . Diabetes Mother   . Heart disease Father   . Cancer Brother     Social History   Tobacco Use  . Smoking status: Never Smoker  . Smokeless tobacco: Never Used  Substance Use Topics  . Alcohol use: No    Alcohol/week: 0.0 standard drinks  . Drug use: No    Home Medications Prior to Admission medications   Medication Sig Start Date End Date Taking? Authorizing Provider  acetaminophen (TYLENOL) 325 MG tablet Take 1-2 tablets (325-650 mg total) by mouth every 4 (four) hours as  needed for mild pain. 08/27/18   Rasmusson, Evlyn Kanner, PA-C  calcium carbonate (TUMS EX) 750 MG chewable tablet Chew 2 tablets by mouth as needed for heartburn.    [provider]  clopidogrel (PLAVIX) 75 MG tablet TAKE 1 TABLET BY MOUTH DAILY 08/28/18   Myles Lipps, MD  glucose blood (ONETOUCH VERIO) test strip 1 each by Other route 4 (four) times daily -  before meals and at bedtime. Patient taking differently: 1 each by Other route 4 (four) times daily -  before meals and at bedtime. dexcom 6 08/27/18   Jaquez, Evlyn Kanner, PA-C  Insulin Glargine (LANTUS [place]) Inject into the skin. Take 45 units in the am and 55 at qhs    [provider]  Insulin Syringe-Needle U-100 (B-D INS SYR ULTRAFINE 1CC/31G) 31G X 5/16" 1 ML MISC Used to inject insulin twice daily. 08/16/17   Romero Belling, MD  LEVOTHYROXINE SODIUM PO Take 5 mcg by mouth.    [provider]  lisinopril (ZESTRIL) 2.5 MG tablet Take 2.5 mg by mouth daily.    [provider]  metFORMIN (GLUCOPHAGE-XR) 500 MG 24 hr tablet Take 500 mg by mouth daily with breakfast.    [provider]  pantoprazole (PROTONIX) 40 MG tablet TAKE 1 TABLET BY MOUTH AT BEDTIME 08/28/18  Myles Lipps, MD  rosuvastatin (CRESTOR) 40 MG tablet TAKE 1 TABLET BY MOUTH DAILY 08/28/18   Myles Lipps, MD    Allergies    Flexeril Fernande Boyden hcl], Lidocaine, and Novocain [procaine hcl]  Review of Systems   Review of Systems  Unable to perform ROS: Acuity of condition  All other systems reviewed and are negative.   Physical Exam Updated Vital Signs Wt 95.8 kg   BMI 32.11 kg/m   Physical Exam Vitals and nursing note reviewed.  HENT:     Head: Normocephalic.     Right Ear: External ear normal.     Left Ear: External ear normal.     Nose: Nose normal.     Mouth/Throat:     Mouth: Mucous membranes are moist.  Eyes:     Extraocular Movements: Extraocular movements intact.     Pupils: Pupils are equal, round, and  reactive to light.  Cardiovascular:     Rate and Rhythm: Normal rate and regular rhythm.     Pulses: Normal pulses.  Pulmonary:     Effort: Pulmonary effort is normal.     Breath sounds: Normal breath sounds.  Abdominal:     General: Abdomen is flat.     Palpations: Abdomen is soft.  Musculoskeletal:        General: Normal range of motion.     Cervical back: Normal range of motion.  Skin:    General: Skin is warm and dry.     Capillary Refill: Capillary refill takes less than 2 seconds.  Neurological:     General: No focal deficit present.     Mental Status: He is alert and oriented to person, place, and time.     Cranial Nerves: No cranial nerve deficit.     Sensory: No sensory deficit.     Motor: No weakness.     Coordination: Coordination normal.     Deep Tendon Reflexes: Reflexes normal.  Psychiatric:        Mood and Affect: Mood normal.        Behavior: Behavior normal.     ED Results / Procedures / Treatments   Labs (all labs ordered are listed, but only abnormal results are displayed) Labs Reviewed  I-STAT CHEM 8, ED - Abnormal; Notable for the following components:      Result Value   Creatinine, Ser 1.50 (*)    Glucose, Bld 164 (*)    Calcium, Ion 1.08 (*)    TCO2 18 (*)    All other components within normal limits  CBC  DIFFERENTIAL  PROTIME-INR  APTT  COMPREHENSIVE METABOLIC PANEL  CBG MONITORING, ED    EKG EKG Interpretation  Date/Time:  Thursday March 10 2020 13:05:24 EDT Ventricular Rate:  71 PR Interval:    QRS Duration: 100 QT Interval:  385 QTC Calculation: 419 R Axis:   32 Text Interpretation: Sinus rhythm Confirmed by Margarita Grizzle [phone]) on 03/10/2020 1:58:49 PM   Radiology CT HEAD CODE STROKE WO CONTRAST  Result Date: 03/10/2020 CLINICAL DATA:  Code stroke.  Abnormal gaze and aphasia EXAM: CT HEAD WITHOUT CONTRAST TECHNIQUE: Contiguous axial images were obtained from the base of the skull through the vertex without intravenous  contrast. COMPARISON:  March 2020 FINDINGS: Brain: No acute intracranial hemorrhage, mass effect, or edema. No new loss of gray-white differentiation. Prominence of the ventricles and sulci reflects stable parenchymal volume loss. Patchy and confluent areas of hypoattenuation in the supratentorial white matter is nonspecific but probably reflects  similar moderate chronic microvascular ischemic changes. No extra-axial fluid collection. Vascular: No hyperdense vessel. There is intracranial atherosclerotic calcification at the skull base. Skull: Unremarkable. Sinuses/Orbits: No acute abnormality. Other: Mastoid air cells are clear. ASPECTS (Alberta Stroke Program Early CT Score) - Ganglionic level infarction (caudate, lentiform nuclei, internal capsule, insula, M1-M3 cortex): 7 - Supraganglionic infarction (M4-M6 cortex): 3 Total score (0-10 with 10 being normal): 10 IMPRESSION: No acute intracranial hemorrhage or evidence of acute infarction. ASPECT score is 10. These results were communicated to Dr. Otelia LimesLindzen at 12:54 pm on 03/10/2020 by text page via the [facility] messaging system. Electronically Signed   By: Guadlupe SpanishPraneil  Patel M.D.   On: 03/10/2020 12:58   CT ANGIO HEAD CODE STROKE  Result Date: 03/10/2020 CLINICAL DATA:  69 year old male with abnormal gaze and aphasia. EXAM: CT ANGIOGRAPHY HEAD AND NECK TECHNIQUE: Multidetector CT imaging of the head and neck was performed using the standard protocol during bolus administration of intravenous contrast. Multiplanar CT image reconstructions and MIPs were obtained to evaluate the vascular anatomy. Carotid stenosis measurements (when applicable) are obtained utilizing NASCET criteria, using the distal internal carotid diameter as the denominator. CONTRAST:  50mL OMNIPAQUE IOHEXOL 350 MG/ML SOLN COMPARISON:  Plain head CT 1252 hours today. Brain MRI 08/10/2018. CTA head and neck 08/08/2018. FINDINGS: CTA NECK Skeleton: Unchanged prior cervical ACDF including anteriorly  protruding C7 screws. No acute osseous abnormality identified. Upper chest: No acute findings. Other neck: No acute findings. Aortic arch: Stable 3 vessel arch with minimal atherosclerosis. Right carotid system: Stable and patent with minor soft plaque at the bifurcation, no stenosis. Left carotid system: Stable and patent with minimal soft plaque at the left ICA origin. Vertebral arteries: Stable proximal right subclavian artery and right vertebral artery origin with minimal plaque and no significant stenosis. Dominant right vertebral artery with stable caliber and no stenosis to the skull base. Stable proximal left subclavian artery with only mild plaque and no stenosis. But suspected moderate stenosis at the left vertebral artery origin due to soft plaque (series 8, image 130) is stable. The left vertebral remains patent to the skull base without additional stenosis. CTA HEAD Posterior circulation: Left V4 segment occlusion redemonstrated. Left PICA seems to remain patent via collaterals as before. Faint reconstitution of the left vertebrobasilar junction is unchanged, and this appears to be associated with a fenestrated proximal basilar. Stable dominant appearing distal right vertebral artery with mild calcified plaque but no stenosis. Patent right PICA origin which is somewhat early. Basilar artery is stable and patent with mild distal basilar irregularity, no significant stenosis. SCA and PCA origins remain patent. But there is new moderate right P1 stenosis on series 9, image 27. Stable left P1. Stable superimposed mild bilateral PCA P2 irregularity and stenosis, greater on the right. Anterior circulation: Stable ICA siphons which are heavily calcified. On the left moderate to severe anterior genu and supraclinoid stenosis is stable. On the right severe cavernous segment and supraclinoid segment stenoses appears stable (series 8, image 109 and 111). Carotid termini, MCA and ACA origins are stable and patent.  Dominant right A1. Bilateral ACA branches are stable. Normal anterior communicating artery with median artery of the corpus callosum. Left MCA M1 segment and trifurcation are patent without stenosis. Left MCA branches are stable with mild irregularity. Right MCA M1 segment and trifurcation are patent without stenosis. Right MCA branches are stable with mild irregularity. Venous sinuses: Early contrast timing, but the major dural venous sinuses appear to be patent. The right transverse  and sigmoid are dominant as before. Anatomic variants: Dominant right vertebral artery. Dominant right ACA A1. Review of the MIP images confirms the above findings IMPRESSION: 1. Negative for large vessel occlusion 2. Positive for stable advanced ICA siphon atherosclerosis with Severe Right > Left Siphon stenosis. 3. 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 Left PICA remains patent. 4. New Moderate stenosis of the Right PCA P1 segment. 5. Otherwise stable since the 2020 CTA, intracranial > extracranial atherosclerosis. These results were communicated to Dr. Otelia Limes at 1:19 pmon 10/7/2021by text page via the [facility] messaging system. Electronically Signed   By: Odessa Fleming M.D.   On: 03/10/2020 13:20   CT ANGIO NECK CODE STROKE  Result Date: 03/10/2020 CLINICAL DATA:  69 year old male with abnormal gaze and aphasia. EXAM: CT ANGIOGRAPHY HEAD AND NECK TECHNIQUE: Multidetector CT imaging of the head and neck was performed using the standard protocol during bolus administration of intravenous contrast. Multiplanar CT image reconstructions and MIPs were obtained to evaluate the vascular anatomy. Carotid stenosis measurements (when applicable) are obtained utilizing NASCET criteria, using the distal internal carotid diameter as the denominator. CONTRAST:  50mL OMNIPAQUE IOHEXOL 350 MG/ML SOLN COMPARISON:  Plain head CT 1252 hours today. Brain MRI 08/10/2018. CTA head and neck 08/08/2018. FINDINGS: CTA NECK Skeleton: Unchanged prior  cervical ACDF including anteriorly protruding C7 screws. No acute osseous abnormality identified. Upper chest: No acute findings. Other neck: No acute findings. Aortic arch: Stable 3 vessel arch with minimal atherosclerosis. Right carotid system: Stable and patent with minor soft plaque at the bifurcation, no stenosis. Left carotid system: Stable and patent with minimal soft plaque at the left ICA origin. Vertebral arteries: Stable proximal right subclavian artery and right vertebral artery origin with minimal plaque and no significant stenosis. Dominant right vertebral artery with stable caliber and no stenosis to the skull base. Stable proximal left subclavian artery with only mild plaque and no stenosis. But suspected moderate stenosis at the left vertebral artery origin due to soft plaque (series 8, image 130) is stable. The left vertebral remains patent to the skull base without additional stenosis. CTA HEAD Posterior circulation: Left V4 segment occlusion redemonstrated. Left PICA seems to remain patent via collaterals as before. Faint reconstitution of the left vertebrobasilar junction is unchanged, and this appears to be associated with a fenestrated proximal basilar. Stable dominant appearing distal right vertebral artery with mild calcified plaque but no stenosis. Patent right PICA origin which is somewhat early. Basilar artery is stable and patent with mild distal basilar irregularity, no significant stenosis. SCA and PCA origins remain patent. But there is new moderate right P1 stenosis on series 9, image 27. Stable left P1. Stable superimposed mild bilateral PCA P2 irregularity and stenosis, greater on the right. Anterior circulation: Stable ICA siphons which are heavily calcified. On the left moderate to severe anterior genu and supraclinoid stenosis is stable. On the right severe cavernous segment and supraclinoid segment stenoses appears stable (series 8, image 109 and 111). Carotid termini, MCA and  ACA origins are stable and patent. Dominant right A1. Bilateral ACA branches are stable. Normal anterior communicating artery with median artery of the corpus callosum. Left MCA M1 segment and trifurcation are patent without stenosis. Left MCA branches are stable with mild irregularity. Right MCA M1 segment and trifurcation are patent without stenosis. Right MCA branches are stable with mild irregularity. Venous sinuses: Early contrast timing, but the major dural venous sinuses appear to be patent. The right transverse and sigmoid are dominant as  before. Anatomic variants: Dominant right vertebral artery. Dominant right ACA A1. Review of the MIP images confirms the above findings IMPRESSION: 1. Negative for large vessel occlusion 2. Positive for stable advanced ICA siphon atherosclerosis with Severe Right > Left Siphon stenosis. 3. 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 Left PICA remains patent. 4. New Moderate stenosis of the Right PCA P1 segment. 5. Otherwise stable since the 2020 CTA, intracranial > extracranial atherosclerosis. These results were communicated to Dr. Otelia Limes at 1:19 pmon 10/7/2021by text page via the [facility] messaging system. Electronically Signed   By: Odessa Fleming M.D.   On: 03/10/2020 13:20    Procedures Procedures (including critical care time)  Medications Ordered in ED Medications  sodium chloride flush (NS) 0.9 % injection 3 mL (has no administration in time range)    ED Course  I have reviewed the triage vital signs and the nursing notes.  Pertinent labs & imaging results that were available during my care of the patient were reviewed by me and considered in my medical decision making (see chart for details).    MDM Rules/Calculators/A&P                          Patient seen as code stroke- symptoms cleared.  Stroke canceled.  Dr. Alanda Slim will see for admission.  Final Clinical Impression(s) / ED Diagnoses Final diagnoses:  TIA (transient ischemic attack)     Rx / DC Orders ED Discharge Orders    None       Margarita Grizzle, MD 03/10/20 1459

## 2020-03-10 NOTE — ED Triage Notes (Signed)
Pt arived by [facility] with symptoms of a stoke. Pt was eating lunch with family when all of a sudden got very limp bilaterally, sudden onset of aphasia, abnormal gait. EMS reported no facial droop and could follow commands.  Last known well - 12:06 but EMS reported that symptoms resolved at 12:30. Pt arrived to ED at 12:34.  Pt last had a stoke in March 2020 with lack of coordination. Family reports that symptoms were dramatically different this time.

## 2020-03-10 NOTE — Consult Note (Signed)
NEURO HOSPITALIST CONSULT NOTE   Requestig physician: Dr. Rosalia Hammers  Reason for Consult: Code stroke  History obtained from: EMS  HPI:                                                                                                                                          Jeff [name] is a 69 y.o. male with history of hypertension, hyperlipidemia, stroke and diabetes.  Patient was out eating lunch with his family when at 1206 he was noted to have a sudden onset of blinks there and not speaking.  EMS was called and they noted that he wss able to follow commands; however, when he tried to answer the questions he would give one-word answers and/or garbled responses.  En route at 1230 EMS noted that he had improved to the point where he was back to his baseline, had intact speech and was moving all extremities equally. On arriving to the ER he did admit that he could not understand what other people were saying during that period of time.  CBG was 140 on arrival. However, the patient later admitted that while his spell was occurring at lunch, his transdermal glucometer registered a reading of 48. The symptoms had improved after a sugar-containing soda was given to him. Family was apparently still worried about a stroke, however and EMS was called. Family had stated to EMS that he had a stroke in the past with residual lack of coordination; what caused them to call EMS was that this was very very different than his previous stroke symptoms. BP on arrival to the ED was 160/88.  tPA- no due to resolved symptoms Modified Rankin scale- 1 NIH stroke scale-2  Past Medical History:  Diagnosis Date  . ADHD (attention deficit hyperactivity disorder)   . Diabetes mellitus   . Hyperlipidemia   . Hypertension   . Stroke [facility])     Past Surgical History:  Procedure Laterality Date  . NECK SURGERY    . SPINE SURGERY     Cervical spine x 2; Vear Clock; [employer].    Family History  Problem  Relation Age of Onset  . Heart disease Mother        CHF  . Diabetes Mother   . Heart disease Father   . Cancer Brother            Social History:  reports that he has never smoked. He has never used smokeless tobacco. He reports that he does not drink alcohol and does not use drugs.  Allergies  Allergen Reactions  . Flexeril [Cyclobenzaprine Hcl] Other (See Comments)    Causes him to pass out and lowers his BP  . Lidocaine Swelling    throat  . Novocain [Procaine Hcl] Swelling    MEDICATIONS:  Prior to Admission: (Not in a hospital admission)  Scheduled: . sodium chloride flush  3 mL Intravenous Once     ROS:                                                                                                                                       History obtained from the patient  General ROS: negative for - chills, fatigue, fever, night sweats, weight gain or weight loss Psychological ROS: Positive for -anxiety secondary to ADHD or suicidal ideation Ophthalmic ROS: negative for - blurry vision, double vision, eye pain or loss of vision Respiratory ROS: negative for - cough, hemoptysis, shortness of breath or wheezing Cardiovascular ROS: negative for - chest pain, dyspnea on exertion, edema or irregular heartbeat Gastrointestinal ROS: negative for - abdominal pain, diarrhea, hematemesis, nausea/vomiting or stool incontinence Genito-Urinary ROS: negative for - dysuria, hematuria, incontinence or urinary frequency/urgency Musculoskeletal ROS: Positive for - muscular weakness Neurological ROS: as noted in HPI Dermatological ROS: negative for rash and skin lesion changes   Weight 95.8 kg.   General Examination:                                                                                                       Physical Exam  HEENT-  Normocephalic, no lesions,  without obvious abnormality.  Normal external eye and conjunctiva.   Cardiovascular- S1-S2 audible, pulses palpable throughout   Lungs-no rhonchi or wheezing noted, no excessive working breathing.  Saturations within normal limits Abdomen- All 4 quadrants palpated and nontender Extremities- Warm, dry and intact Musculoskeletal-no joint tenderness, deformity or swelling Skin-warm and dry, no hyperpigmentation, vitiligo, or suspicious lesions  Neurological Examination Mental Status: Alert, oriented, thought content appropriate.  Speech fluent without evidence of aphasia.  Able to follow 3 step commands without difficulty. Cranial Nerves: II: Discs flat bilaterally; Visual fields grossly normal,  III,IV, VI: ptosis not present, extra-ocular motions intact bilaterally pupils equal, round, reactive to light and accommodation V,VII: smile symmetric, facial light touch sensation normal bilaterally VIII: hearing normal bilaterally IX,X: uvula rises symmetrically XI: bilateral shoulder shrug XII: midline tongue extension Motor: RUE: Deltoid 4/5. Otherwise 5/5 RLE 4/5 hip flexion, otherwise 5/5 LUE and LLE 5/5  Sensory: Temp and light touch intact throughout, bilaterally Deep Tendon Reflexes: Brisk 3+ and symmetric throughout Plantars: Right: downgoing   Left: downgoing Cerebellar: Normal FNF bilaterally  Gait: Deferred   Lab Results: Basic Metabolic Panel: Recent Labs  Lab 03/10/20 1244  NA 139  K 4.1  CL 105  GLUCOSE 164*  BUN 14  CREATININE 1.50*    CBC: Recent Labs  Lab 03/10/20 1238 03/10/20 1244  WBC 6.5  --   NEUTROABS 3.8  --   HGB 14.0 13.3  HCT 42.4 39.0  MCV 87.6  --   PLT 201  --     Imaging: No results found.  Assessment: 69 year old male presenting to the emergency department as a code stroke with symptoms of transient expressive and receptive aphasia.  En route patient's symptoms had fully resolved.   1. DDx for presentation is TIA versus hypoglycemic  event (see HPI for description of low glucose from transdermal meter that was noted during his spell, with partial resolution following a sugar-containing soda).  2. CT head: No acute intracranial hemorrhage or evidence of acute infarction. ASPECT score is 10. 3. CTA of head and neck: Negative for large vessel occlusion. Positive for stable advanced ICA siphon atherosclerosis with Severe Right > Left Siphon stenosis. 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 Left PICA remains patent. New Moderate stenosis of the Right PCA P1 segment. Otherwise stable since the 2020 CTA, intracranial > extracranial atherosclerosis. 4. Patient was not a tPA candidate secondary to symptoms fully resolving.    Recommendations: -MRI of the brain without contrast  -Transthoracic Echo -Continue Plavix -Start or continue Atorvastatin 80 mg/other high intensity statin -BP goal: permissive HTN upto 220/120 mmHg -HBAIC and Lipid profile -Telemetry monitoring -Frequent neuro checks -NPO until passes stroke swallow screen -PT/OT # please page stroke NP  Or  PA  Or MD from 8am -4 pm  as this patient from this time will be  followed by the stroke.   You can look them up on [email or web address]  Password [email or web address]   Electronically signed: Dr. Caryl Pina  03/10/2020, 12:53 PM

## 2020-03-11 ENCOUNTER — Observation Stay (HOSPITAL_COMMUNITY): Payer: No Typology Code available for payment source

## 2020-03-11 DIAGNOSIS — Z8673 Personal history of transient ischemic attack (TIA), and cerebral infarction without residual deficits: Secondary | ICD-10-CM

## 2020-03-11 DIAGNOSIS — R2990 Unspecified symptoms and signs involving the nervous system: Secondary | ICD-10-CM | POA: Diagnosis not present

## 2020-03-11 DIAGNOSIS — E11649 Type 2 diabetes mellitus with hypoglycemia without coma: Secondary | ICD-10-CM | POA: Diagnosis not present

## 2020-03-11 DIAGNOSIS — G459 Transient cerebral ischemic attack, unspecified: Secondary | ICD-10-CM

## 2020-03-11 LAB — COMPREHENSIVE METABOLIC PANEL
ALT: 24 U/L (ref 0–44)
AST: 18 U/L (ref 15–41)
Albumin: 3.4 g/dL — ABNORMAL LOW (ref 3.5–5.0)
Alkaline Phosphatase: 42 U/L (ref 38–126)
Anion gap: 9 (ref 5–15)
BUN: 15 mg/dL (ref 8–23)
CO2: 24 mmol/L (ref 22–32)
Calcium: 8.9 mg/dL (ref 8.9–10.3)
Chloride: 105 mmol/L (ref 98–111)
Creatinine, Ser: 1.42 mg/dL — ABNORMAL HIGH (ref 0.61–1.24)
GFR calc non Af Amer: 49 mL/min — ABNORMAL LOW (ref >60)
Glucose, Bld: 187 mg/dL — ABNORMAL HIGH (ref 70–99)
Potassium: 4.1 mmol/L (ref 3.5–5.1)
Sodium: 138 mmol/L (ref 135–145)
Total Bilirubin: 0.3 mg/dL (ref 0.3–1.2)
Total Protein: 6 g/dL — ABNORMAL LOW (ref 6.5–8.1)

## 2020-03-11 LAB — GLUCOSE, CAPILLARY
Glucose-Capillary: 192 mg/dL — ABNORMAL HIGH (ref 70–99)
Glucose-Capillary: 225 mg/dL — ABNORMAL HIGH (ref 70–99)
Glucose-Capillary: 242 mg/dL — ABNORMAL HIGH (ref 70–99)

## 2020-03-11 LAB — LIPID PANEL
Cholesterol: 105 mg/dL (ref 0–200)
HDL: 29 mg/dL — ABNORMAL LOW (ref >40)
LDL Cholesterol: 45 mg/dL (ref 0–99)
Total CHOL/HDL Ratio: 3.6 RATIO
Triglycerides: 157 mg/dL — ABNORMAL HIGH (ref <150)
VLDL: 31 mg/dL (ref 0–40)

## 2020-03-11 LAB — CBC WITH DIFFERENTIAL/PLATELET
Abs Immature Granulocytes: 0.03 10*3/uL (ref 0.00–0.07)
Basophils Absolute: 0 10*3/uL (ref 0.0–0.1)
Basophils Relative: 1 %
Eosinophils Absolute: 0.3 10*3/uL (ref 0.0–0.5)
Eosinophils Relative: 5 %
HCT: 38.3 % — ABNORMAL LOW (ref 39.0–52.0)
Hemoglobin: 12.5 g/dL — ABNORMAL LOW (ref 13.0–17.0)
Immature Granulocytes: 1 %
Lymphocytes Relative: 23 %
Lymphs Abs: 1.4 10*3/uL (ref 0.7–4.0)
MCH: 28.1 pg (ref 26.0–34.0)
MCHC: 32.6 g/dL (ref 30.0–36.0)
MCV: 86.1 fL (ref 80.0–100.0)
Monocytes Absolute: 0.6 10*3/uL (ref 0.1–1.0)
Monocytes Relative: 10 %
Neutro Abs: 3.8 10*3/uL (ref 1.7–7.7)
Neutrophils Relative %: 60 %
Platelets: 203 10*3/uL (ref 150–400)
RBC: 4.45 MIL/uL (ref 4.22–5.81)
RDW: 13.3 % (ref 11.5–15.5)
WBC: 6.1 10*3/uL (ref 4.0–10.5)
nRBC: 0 % (ref 0.0–0.2)

## 2020-03-11 LAB — HEMOGLOBIN A1C
Hgb A1c MFr Bld: 6.8 % — ABNORMAL HIGH (ref 4.8–5.6)
Mean Plasma Glucose: 148.46 mg/dL

## 2020-03-11 LAB — PHOSPHORUS: Phosphorus: 3.7 mg/dL (ref 2.5–4.6)

## 2020-03-11 LAB — MAGNESIUM: Magnesium: 1.8 mg/dL (ref 1.7–2.4)

## 2020-03-11 MED ORDER — INSULIN GLARGINE 100 UNIT/ML ~~LOC~~ SOLN: 25.0000 [IU] | Freq: Two times a day (BID) | SUBCUTANEOUS | 11 refills | Status: DC

## 2020-03-11 MED ORDER — ROSUVASTATIN CALCIUM 20 MG PO TABS
40.0000 mg | ORAL_TABLET | Freq: Every day | ORAL | Status: DC
  Administered 2020-03-11: 40 mg via ORAL
  Filled 2020-03-11: qty 2

## 2020-03-11 MED ORDER — LEVOTHYROXINE SODIUM 50 MCG PO TABS: 50.0000 ug | ORAL_TABLET | Freq: Every day | ORAL | Status: DC

## 2020-03-11 MED ORDER — PANTOPRAZOLE SODIUM 40 MG PO TBEC: 40.0000 mg | DELAYED_RELEASE_TABLET | Freq: Every day | ORAL | Status: DC

## 2020-03-11 MED ORDER — CLOPIDOGREL BISULFATE 75 MG PO TABS
75.0000 mg | ORAL_TABLET | Freq: Every day | ORAL | Status: DC
  Administered 2020-03-11: 75 mg via ORAL
  Filled 2020-03-11: qty 1

## 2020-03-11 MED ORDER — SENNOSIDES-DOCUSATE SODIUM 8.6-50 MG PO TABS: 1.0000 | ORAL_TABLET | Freq: Every evening | ORAL | 0 refills | Status: DC | PRN

## 2020-03-11 NOTE — Evaluation (Signed)
Physical Therapy Evaluation Patient Details Name: Jeff [name] MRN: [id number] DOB: [birth year] Today's Date: 03/11/2020   History of Present Illness  Pt is a 69 year old male who presented with R UE weakness and symptoms of transient receptive and expressive aphasia. NIH progressing from 2 to 0. TPA was not administered. CT and MRI were negative for intracranial hemorrhage or acute infarct. They are suspecting hypoglycemic event.   Clinical Impression  Pt appears to be at his baseline per pt report. He does not display any significant weakness or deficits between his L and R sides, but does display minor balance deficits. Indicated by his DGI score of 20 this date. He is aware of these deficits and have been his norm since his previous stroke. He is able to safely ambulate and navigate stairs without an assistive device with only supervision this date. He reports previously having balance deficits particularly with ambulating on uneven surfaces, like the beach. This has prevented him from being able to enjoy the beach, and thus educated pt on utilizing a rolling gear cart for stability on the sand to improve his quality of life as he was resistant to wanting to utilize an assistive device. Pt agreeable to this recommendation. Educated pt on [employer] and provided handout. Due to pt being at baseline and of high functional independence and safety with him being aware of his deficits, no further PT is needed at this time.    Follow Up Recommendations No PT follow up;Supervision - Intermittent    Equipment Recommendations  None recommended by PT    Recommendations for Other Services       Precautions / Restrictions Precautions Precautions: Fall Restrictions Weight Bearing Restrictions: No      Mobility  Bed Mobility Overal bed mobility: Independent                Transfers Overall transfer level: Independent Equipment used: None             General transfer comment: Pt able to  come to stand in appropriate amount of time safely without unsteadiness.  Ambulation/Gait Ambulation/Gait assistance: Supervision Gait Distance (Feet): 300 Feet Assistive device: None Gait Pattern/deviations: Step-through pattern;Decreased stance time - right (L LE externally rotated) Gait velocity: WFL Gait velocity interpretation: >2.62 ft/sec, indicative of community ambulatory General Gait Details: Ambulates safely without LOB, but mild balance deficits/unsteadiness noted. Pt aware of his deficits and reports this is baseline.  Stairs Stairs: Yes Stairs assistance: Supervision Stair Management: One rail Right;One rail Left;Alternating pattern Number of Stairs: 4 General stair comments: Ascends with R handrail and descends with L, displaying reciprocal step pattern with appropriate speed. Slight unsteadiness noted descending, but no LOB.  Wheelchair Mobility    Modified Rankin (Stroke Patients Only) Modified Rankin (Stroke Patients Only) Pre-Morbid Rankin Score: No significant disability Modified Rankin: No significant disability     Balance Overall balance assessment: Mild deficits observed, not formally tested                               Standardized Balance Assessment Standardized Balance Assessment : Dynamic Gait Index   Dynamic Gait Index Level Surface: Normal Change in Gait Speed: Normal Gait with Horizontal Head Turns: Mild Impairment Gait with Vertical Head Turns: Mild Impairment Gait and Pivot Turn: Normal Step Over Obstacle: Mild Impairment Step Around Obstacles: Normal Steps: Mild Impairment Total Score: 20       Pertinent Vitals/Pain Pain Assessment: No/denies  pain    Home Living Family/patient expects to be discharged to:: Private residence Living Arrangements: Spouse/significant other Available Help at Discharge: Family;Available 24 hours/day Type of Home: House Home Access: Stairs to enter;Ramped entrance Entrance Stairs-Rails:   (unknown) Entrance Stairs-Number of Steps: 3 Home Layout: One level Home Equipment: Walker - 2 wheels;Cane - single point;Bedside commode;Shower seat;Wheelchair - manual (grab bars in bathroom) Additional Comments: Pt still driving but retired.    Prior Function Level of Independence: Independent         Comments: Pt reports some balance deficits at baseline, esp on uneven surfaces, from previous stroke.      Hand Dominance        Extremity/Trunk Assessment   Upper Extremity Assessment Upper Extremity Assessment: Overall WFL for tasks assessed    Lower Extremity Assessment Lower Extremity Assessment: Overall WFL for tasks assessed (No N/T; coord intact; dynamic propr intact B ankles)       Communication   Communication: No difficulties  Cognition Arousal/Alertness: Awake/alert Behavior During Therapy: WFL for tasks assessed/performed Overall Cognitive Status: Within Functional Limits for tasks assessed                                 General Comments: A&O x4 and aware of deficits and safety concerns      General Comments      Exercises     Assessment/Plan    PT Assessment Patent does not need any further PT services  PT Problem List         PT Treatment Interventions      PT Goals (Current goals can be found in the Care Plan section)  Acute Rehab PT Goals Patient Stated Goal: to go home and to beach PT Goal Formulation: With patient Time For Goal Achievement: 03/11/20 Potential to Achieve Goals: Good    Frequency     Barriers to discharge        Co-evaluation               AM-PAC PT "6 Clicks" Mobility  Outcome Measure Help needed turning from your back to your side while in a flat bed without using bedrails?: None Help needed moving from lying on your back to sitting on the side of a flat bed without using bedrails?: None Help needed moving to and from a bed to a chair (including a wheelchair)?: None Help needed standing  up from a chair using your arms (e.g., wheelchair or bedside chair)?: None Help needed to walk in hospital room?: None Help needed climbing 3-5 steps with a railing? : None 6 Click Score: 24    End of Session Equipment Utilized During Treatment: Gait belt Activity Tolerance: Patient tolerated treatment well Patient left: in bed;with call bell/phone within reach   PT Visit Diagnosis: Unsteadiness on feet (R26.81);History of falling (Z91.81)    Time: 8469-6295 PT Time Calculation (min) (ACUTE ONLY): 29 min   Charges:   PT Evaluation $PT Eval Low Complexity: 1 Low PT Treatments $Gait Training: 8-22 mins        Raymond Gurney, PT, DPT Acute Rehabilitation Services  Pager: [phone] Office: [phone]   Jewel Baize 03/11/2020, 9:33 AM

## 2020-03-11 NOTE — Progress Notes (Signed)
STROKE TEAM PROGRESS NOTE   INTERVAL HISTORY No family at bedside.  Patient sitting at edge of the bed, neuro intact.  He stated that he was in the restaurant, just started eating, his continuous glucose monitoring beeped and showed glucose 48.  And he started to have difficulty talking and blank stares.  Symptoms improving at arrival of EMS, resolved in ER in 2 hours.  MRI negative.  EEG negative.  Vitals:   03/10/20 2057 03/10/20 2208 03/11/20 0008 03/11/20 0403  BP:  127/80 109/73 130/71  Pulse:  65 65 67  Resp:  18 18 17   Temp:  97.8 F (36.6 C) 97.8 F (36.6 C) 97.8 F (36.6 C)  TempSrc:  Oral Oral Oral  SpO2:  98% 99% 100%  Weight:      Height: 5\' 8"  (1.727 m)      CBC:  Recent Labs  Lab 03/10/20 1238 03/10/20 1244  WBC 6.5  --   NEUTROABS 3.8  --   HGB 14.0 13.3  HCT 42.4 39.0  MCV 87.6  --   PLT 201  --    Basic Metabolic Panel:  Recent Labs  Lab 03/10/20 1238 03/10/20 1244  NA 138 139  K 4.1 4.1  CL 106 105  CO2 20*  --   GLUCOSE 166* 164*  BUN 13 14  CREATININE 1.48* 1.50*  CALCIUM 8.8*  --    Lipid Panel:  Recent Labs  Lab 03/11/20 0505  CHOL 105  TRIG 157*  HDL 29*  CHOLHDL 3.6  VLDL 31  LDLCALC 45   HgbA1c:  Recent Labs  Lab 03/11/20 0505  HGBA1C 6.8*   Urine Drug Screen: No results for input(s): LABOPIA, COCAINSCRNUR, LABBENZ, AMPHETMU, THCU, LABBARB in the last 168 hours.  Alcohol Level No results for input(s): ETH in the last 168 hours.  IMAGING past 24 hours MR BRAIN WO CONTRAST  Result Date: 03/10/2020 CLINICAL DATA:  TIA. Transient aphasia, facial numbness, and arm weakness. EXAM: MRI HEAD WITHOUT CONTRAST TECHNIQUE: Multiplanar, multiecho pulse sequences of the brain and surrounding structures were obtained without intravenous contrast. COMPARISON:  Head CT and CTA 03/10/2020 and head MRI 08/10/2018 FINDINGS: Brain: There is no evidence of an acute infarct, intracranial hemorrhage, mass, midline shift, or extra-axial fluid  collection. Patchy T2 hyperintensities in the cerebral white matter and pons are unchanged from the prior MRI and are nonspecific but compatible with moderate chronic small vessel ischemic disease. There is moderate central predominant cerebral atrophy. Vascular: Occlusion of the distal left vertebral artery as shown on today's earlier CTA. Skull and upper cervical spine: Unremarkable bone marrow signal para Sinuses/Orbits: Bilateral cataract extraction. Minimal mucosal thickening in the paranasal sinuses. Trace right mastoid fluid. Other: None. IMPRESSION: 1. No acute intracranial abnormality. 2. Moderate chronic small vessel ischemic disease and cerebral atrophy. Electronically Signed   By: 05/10/2020 M.D.   On: 03/10/2020 16:07   ECHOCARDIOGRAM COMPLETE  Result Date: 03/10/2020    ECHOCARDIOGRAM REPORT   Patient Name:   Jeff [name] Date of Exam: 03/10/2020 Medical Rec #:  Leilani Able  Height:       68.0 in Accession #:    05/10/2020 Weight:       211.2 lb Date of Birth:  25-Sep-1950  BSA:          2.092 m Patient Age:    69 years   BP:           120/75 mmHg Patient Gender: M  HR:           61 bpm. Exam Location:  Inpatient Procedure: 2D Echo, Cardiac Doppler, Color Doppler and Intracardiac            Opacification Agent Indications:    TIA  History:        Patient has no prior history of Echocardiogram examinations,                 most recent 08/08/2018. Risk Factors:Diabetes and Dyslipidemia.                 CKD.  Sonographer:    Ross Ludwig RDCS (AE) Referring Phys: [id number] Almon Hercules  Sonographer Comments: Suboptimal parasternal window and suboptimal apical window. IMPRESSIONS  1. Left ventricular ejection fraction, by estimation, is 60 to 65%. The left ventricle has normal function. The left ventricle has no regional wall motion abnormalities. Left ventricular diastolic parameters are consistent with Grade I diastolic dysfunction (impaired relaxation).  2. Right ventricular systolic function is  normal. The right ventricular size is normal. There is normal pulmonary artery systolic pressure. The estimated right ventricular systolic pressure is 25.1 mmHg.  3. The mitral valve is normal in structure. Trivial mitral valve regurgitation. No evidence of mitral stenosis.  4. The aortic valve is tricuspid. Aortic valve regurgitation is not visualized. Mild aortic valve sclerosis is present, with no evidence of aortic valve stenosis.  5. The inferior vena cava is normal in size with greater than 50% respiratory variability, suggesting right atrial pressure of 3 mmHg. FINDINGS  Left Ventricle: Left ventricular ejection fraction, by estimation, is 60 to 65%. The left ventricle has normal function. The left ventricle has no regional wall motion abnormalities. Definity contrast agent was given IV to delineate the left ventricular  endocardial borders. The left ventricular internal cavity size was normal in size. There is no left ventricular hypertrophy. Left ventricular diastolic parameters are consistent with Grade I diastolic dysfunction (impaired relaxation). Right Ventricle: The right ventricular size is normal. No increase in right ventricular wall thickness. Right ventricular systolic function is normal. There is normal pulmonary artery systolic pressure. The tricuspid regurgitant velocity is 2.35 m/s, and  with an assumed right atrial pressure of 3 mmHg, the estimated right ventricular systolic pressure is 25.1 mmHg. Left Atrium: Left atrial size was normal in size. Right Atrium: Right atrial size was normal in size. Pericardium: There is no evidence of pericardial effusion. Mitral Valve: The mitral valve is normal in structure. There is mild calcification of the mitral valve leaflet(s). Trivial mitral valve regurgitation. No evidence of mitral valve stenosis. MV peak gradient, 5.1 mmHg. The mean mitral valve gradient is 2.0  mmHg. Tricuspid Valve: The tricuspid valve is normal in structure. Tricuspid valve  regurgitation is trivial. Aortic Valve: The aortic valve is tricuspid. Aortic valve regurgitation is not visualized. Mild aortic valve sclerosis is present, with no evidence of aortic valve stenosis. Aortic valve mean gradient measures 4.0 mmHg. Aortic valve peak gradient measures 6.9 mmHg. Aortic valve area, by VTI measures 2.29 cm. Pulmonic Valve: The pulmonic valve was normal in structure. Pulmonic valve regurgitation is not visualized. Aorta: The aortic root is normal in size and structure. Venous: The inferior vena cava is normal in size with greater than 50% respiratory variability, suggesting right atrial pressure of 3 mmHg. IAS/Shunts: No atrial level shunt detected by color flow Doppler.  LEFT VENTRICLE PLAX 2D LVIDd:         4.40 cm  Diastology LVIDs:  3.00 cm  LV e' medial:    7.18 cm/s LV PW:         1.20 cm  LV E/e' medial:  15.2 LV IVS:        1.00 cm  LV e' lateral:   5.98 cm/s LVOT diam:     1.90 cm  LV E/e' lateral: 18.2 LV SV:         64 LV SV Index:   30 LVOT Area:     2.84 cm  RIGHT VENTRICLE RV Basal diam:  2.70 cm RV S prime:     10.00 cm/s TAPSE (M-mode): 2.7 cm LEFT ATRIUM             Index       RIGHT ATRIUM           Index LA diam:        3.30 cm 1.58 cm/m  RA Area:     11.00 cm LA Vol (A2C):   73.3 ml 35.04 ml/m RA Volume:   21.90 ml  10.47 ml/m LA Vol (A4C):   43.0 ml 20.56 ml/m LA Biplane Vol: 57.3 ml 27.39 ml/m  AORTIC VALVE AV Area (Vmax):    2.52 cm AV Area (Vmean):   2.30 cm AV Area (VTI):     2.29 cm AV Vmax:           131.50 cm/s AV Vmean:          90.800 cm/s AV VTI:            0.277 m AV Peak Grad:      6.9 mmHg AV Mean Grad:      4.0 mmHg LVOT Vmax:         117.00 cm/s LVOT Vmean:        73.800 cm/s LVOT VTI:          0.224 m LVOT/AV VTI ratio: 0.81  AORTA Ao Root diam: 3.30 cm Ao Asc diam:  3.10 cm MITRAL VALVE                TRICUSPID VALVE MV Area (PHT): 2.91 cm     TR Peak grad:   22.1 mmHg MV Peak grad:  5.1 mmHg     TR Vmax:        235.00 cm/s MV Mean  grad:  2.0 mmHg MV Vmax:       1.13 m/s     SHUNTS MV Vmean:      63.8 cm/s    Systemic VTI:  0.22 m MV Decel Time: 261 msec     Systemic Diam: 1.90 cm MV E velocity: 109.00 cm/s MV A velocity: 112.00 cm/s MV E/A ratio:  0.97 Marca Anconaalton Mclean MD Electronically signed by Marca Anconaalton Mclean MD Signature Date/Time: 03/10/2020/6:21:51 PM    Final    CT HEAD CODE STROKE WO CONTRAST  Result Date: 03/10/2020 CLINICAL DATA:  Code stroke.  Abnormal gaze and aphasia EXAM: CT HEAD WITHOUT CONTRAST TECHNIQUE: Contiguous axial images were obtained from the base of the skull through the vertex without intravenous contrast. COMPARISON:  March 2020 FINDINGS: Brain: No acute intracranial hemorrhage, mass effect, or edema. No new loss of gray-white differentiation. Prominence of the ventricles and sulci reflects stable parenchymal volume loss. Patchy and confluent areas of hypoattenuation in the supratentorial white matter is nonspecific but probably reflects similar moderate chronic microvascular ischemic changes. No extra-axial fluid collection. Vascular: No hyperdense vessel. There is intracranial atherosclerotic calcification at the skull base. Skull: Unremarkable. Sinuses/Orbits: No  acute abnormality. Other: Mastoid air cells are clear. ASPECTS ([place]berta Stroke Program Early CT Score) - Ganglionic level infarction (caudate, lentiform nuclei, internal capsule, insula, M1-M3 cortex): 7 - Supraganglionic infarction (M4-M6 cortex): 3 Total score (0-10 with 10 being normal): 10 IMPRESSION: No acute intracranial hemorrhage or evidence of acute infarction. ASPECT score is 10. These results were communicated to Dr. Otelia Limes at 12:54 pm on 03/10/2020 by text page via the [facility] messaging system. Electronically Signed   By: Guadlupe Spanish M.D.   On: 03/10/2020 12:58   CT ANGIO HEAD CODE STROKE  Result Date: 03/10/2020 CLINICAL DATA:  69 year old male with abnormal gaze and aphasia. EXAM: CT ANGIOGRAPHY HEAD AND NECK TECHNIQUE: Multidetector CT  imaging of the head and neck was performed using the standard protocol during bolus administration of intravenous contrast. Multiplanar CT image reconstructions and MIPs were obtained to evaluate the vascular anatomy. Carotid stenosis measurements (when applicable) are obtained utilizing NASCET criteria, using the distal internal carotid diameter as the denominator. CONTRAST:  50mL OMNIPAQUE IOHEXOL 350 MG/ML SOLN COMPARISON:  Plain head CT 1252 hours today. Brain MRI 08/10/2018. CTA head and neck 08/08/2018. FINDINGS: CTA NECK Skeleton: Unchanged prior cervical ACDF including anteriorly protruding C7 screws. No acute osseous abnormality identified. Upper chest: No acute findings. Other neck: No acute findings. Aortic arch: Stable 3 vessel arch with minimal atherosclerosis. Right carotid system: Stable and patent with minor soft plaque at the bifurcation, no stenosis. Left carotid system: Stable and patent with minimal soft plaque at the left ICA origin. Vertebral arteries: Stable proximal right subclavian artery and right vertebral artery origin with minimal plaque and no significant stenosis. Dominant right vertebral artery with stable caliber and no stenosis to the skull base. Stable proximal left subclavian artery with only mild plaque and no stenosis. But suspected moderate stenosis at the left vertebral artery origin due to soft plaque (series 8, image 130) is stable. The left vertebral remains patent to the skull base without additional stenosis. CTA HEAD Posterior circulation: Left V4 segment occlusion redemonstrated. Left PICA seems to remain patent via collaterals as before. Faint reconstitution of the left vertebrobasilar junction is unchanged, and this appears to be associated with a fenestrated proximal basilar. Stable dominant appearing distal right vertebral artery with mild calcified plaque but no stenosis. Patent right PICA origin which is somewhat early. Basilar artery is stable and patent with  mild distal basilar irregularity, no significant stenosis. SCA and PCA origins remain patent. But there is new moderate right P1 stenosis on series 9, image 27. Stable left P1. Stable superimposed mild bilateral PCA P2 irregularity and stenosis, greater on the right. Anterior circulation: Stable ICA siphons which are heavily calcified. On the left moderate to severe anterior genu and supraclinoid stenosis is stable. On the right severe cavernous segment and supraclinoid segment stenoses appears stable (series 8, image 109 and 111). Carotid termini, MCA and ACA origins are stable and patent. Dominant right A1. Bilateral ACA branches are stable. Normal anterior communicating artery with median artery of the corpus callosum. Left MCA M1 segment and trifurcation are patent without stenosis. Left MCA branches are stable with mild irregularity. Right MCA M1 segment and trifurcation are patent without stenosis. Right MCA branches are stable with mild irregularity. Venous sinuses: Early contrast timing, but the major dural venous sinuses appear to be patent. The right transverse and sigmoid are dominant as before. Anatomic variants: Dominant right vertebral artery. Dominant right ACA A1. Review of the MIP images confirms the above findings IMPRESSION: 1.  Negative for large vessel occlusion 2. Positive for stable advanced ICA siphon atherosclerosis with Severe Right > Left Siphon stenosis. 3. 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 Left PICA remains patent. 4. New Moderate stenosis of the Right PCA P1 segment. 5. Otherwise stable since the 2020 CTA, intracranial > extracranial atherosclerosis. These results were communicated to Dr. Otelia Limes at 1:19 pmon 10/7/2021by text page via the [facility] messaging system. Electronically Signed   By: Odessa Fleming M.D.   On: 03/10/2020 13:20   CT ANGIO NECK CODE STROKE  Result Date: 03/10/2020 CLINICAL DATA:  69 year old male with abnormal gaze and aphasia. EXAM: CT ANGIOGRAPHY HEAD  AND NECK TECHNIQUE: Multidetector CT imaging of the head and neck was performed using the standard protocol during bolus administration of intravenous contrast. Multiplanar CT image reconstructions and MIPs were obtained to evaluate the vascular anatomy. Carotid stenosis measurements (when applicable) are obtained utilizing NASCET criteria, using the distal internal carotid diameter as the denominator. CONTRAST:  28mL OMNIPAQUE IOHEXOL 350 MG/ML SOLN COMPARISON:  Plain head CT 1252 hours today. Brain MRI 08/10/2018. CTA head and neck 08/08/2018. FINDINGS: CTA NECK Skeleton: Unchanged prior cervical ACDF including anteriorly protruding C7 screws. No acute osseous abnormality identified. Upper chest: No acute findings. Other neck: No acute findings. Aortic arch: Stable 3 vessel arch with minimal atherosclerosis. Right carotid system: Stable and patent with minor soft plaque at the bifurcation, no stenosis. Left carotid system: Stable and patent with minimal soft plaque at the left ICA origin. Vertebral arteries: Stable proximal right subclavian artery and right vertebral artery origin with minimal plaque and no significant stenosis. Dominant right vertebral artery with stable caliber and no stenosis to the skull base. Stable proximal left subclavian artery with only mild plaque and no stenosis. But suspected moderate stenosis at the left vertebral artery origin due to soft plaque (series 8, image 130) is stable. The left vertebral remains patent to the skull base without additional stenosis. CTA HEAD Posterior circulation: Left V4 segment occlusion redemonstrated. Left PICA seems to remain patent via collaterals as before. Faint reconstitution of the left vertebrobasilar junction is unchanged, and this appears to be associated with a fenestrated proximal basilar. Stable dominant appearing distal right vertebral artery with mild calcified plaque but no stenosis. Patent right PICA origin which is somewhat early. Basilar  artery is stable and patent with mild distal basilar irregularity, no significant stenosis. SCA and PCA origins remain patent. But there is new moderate right P1 stenosis on series 9, image 27. Stable left P1. Stable superimposed mild bilateral PCA P2 irregularity and stenosis, greater on the right. Anterior circulation: Stable ICA siphons which are heavily calcified. On the left moderate to severe anterior genu and supraclinoid stenosis is stable. On the right severe cavernous segment and supraclinoid segment stenoses appears stable (series 8, image 109 and 111). Carotid termini, MCA and ACA origins are stable and patent. Dominant right A1. Bilateral ACA branches are stable. Normal anterior communicating artery with median artery of the corpus callosum. Left MCA M1 segment and trifurcation are patent without stenosis. Left MCA branches are stable with mild irregularity. Right MCA M1 segment and trifurcation are patent without stenosis. Right MCA branches are stable with mild irregularity. Venous sinuses: Early contrast timing, but the major dural venous sinuses appear to be patent. The right transverse and sigmoid are dominant as before. Anatomic variants: Dominant right vertebral artery. Dominant right ACA A1. Review of the MIP images confirms the above findings IMPRESSION: 1. Negative for large vessel occlusion  2. Positive for stable advanced ICA siphon atherosclerosis with Severe Right > Left Siphon stenosis. 3. 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 Left PICA remains patent. 4. New Moderate stenosis of the Right PCA P1 segment. 5. Otherwise stable since the 2020 CTA, intracranial > extracranial atherosclerosis. These results were communicated to Dr. Otelia Limes at 1:19 pmon 10/7/2021by text page via the [facility] messaging system. Electronically Signed   By: Odessa Fleming M.D.   On: 03/10/2020 13:20    PHYSICAL EXAM  Temp:  [97.5 F (36.4 C)-97.8 F (36.6 C)] 97.5 F (36.4 C) (10/08 1200) Pulse Rate:   [63-70] 63 (10/08 1200) Resp:  [17-20] 20 (10/08 1200) BP: (109-150)/(71-89) 134/77 (10/08 1200) SpO2:  [97 %-100 %] 99 % (10/08 1200)  General - Well nourished, well developed, in no apparent distress.  Ophthalmologic - fundi not visualized due to noncooperation.  Cardiovascular - Regular rhythm and rate.  Mental Status -  Level of arousal and orientation to time, place, and person were intact. Language including expression, naming, repetition, comprehension was assessed and found intact. Attention span and concentration were normal. Recent and remote memory were intact. Fund of Knowledge was assessed and was intact.  Cranial Nerves II - XII - II - Visual field intact OU. III, IV, VI - Extraocular movements intact. V - Facial sensation intact bilaterally. VII - Facial movement intact bilaterally. VIII - Hearing & vestibular intact bilaterally. X - Palate elevates symmetrically. XI - Chin turning & shoulder shrug intact bilaterally. XII - Tongue protrusion intact.  Motor Strength - The patient's strength was normal in all extremities and pronator drift was absent.  Bulk was normal and fasciculations were absent.   Motor Tone - Muscle tone was assessed at the neck and appendages and was normal.  Reflexes - The patient's reflexes were symmetrical in all extremities and he had no pathological reflexes.  Sensory - Light touch, temperature/pinprick were assessed and were symmetrical.    Coordination - The patient had normal movements in the hands and feet with no ataxia or dysmetria.  Tremor was absent.  Gait and Station - deferred.   ASSESSMENT/PLAN Mr. AYYAN SITES is a 69 y.o. male with history of hypertension, hyperlipidemia, stroke and diabetes presenting with sudden onset of blinks and not speaking.   Strokelike symptoms due to hypoglycemic episode  Code Stroke CT head No acute abnormality. ASPECTS 10.     CTA head & neck no LVO.  Chronic R>L ICA siphon stenoses. L V4  occlusion. New R P1 moderate stenosis. O/w stable intracranial > extracranial atherosclerosis   MRI  No acute abnormality. Small vessel disease. Atrophy.   2D Echo EF 60-65%. No source of embolus   EEG normal   LDL 45  HgbA1c 6.8  clopidogrel 75 mg daily prior to admission, now on clopidogrel 75 mg daily. Continue at d/c.  Therapy recommendations:  No therapy needs  Disposition:  Return home  Hx stroke/TIA  08/2018 - left punctate medullary infarct s/p tPA.  CTA head and neck showed left V4 occlusion, bilateral siphon atherosclerosis.  EF 60 to 65%.  LDL 161, A1c 9.6.  DAPT x 3 wks then plavix alone.  Put on Crestor 40.  CIR.   Hypertension  Stable . BP goal normotensive  Hyperlipidemia  Home meds:  crestor 40, resumed in hospital  LDL 45, goal < 70  Continue statin at discharge  Diabetes type II Controlled Hypoglycemia  HgbA1c 6.8, goal < 7.0  CBGs  SSI  Episode of  glucose 48 prior to admission  Close PCP follow-up for better insulin regimen  Other Stroke Risk Factors  Advanced age  Obesity, Body mass index is 32.11 kg/m., recommend weight loss, diet and exercise as appropriate   Other Active Problems  ADHD  Hospital day # 0  Neurology will sign off. Please call with questions.  No neuro follow-up as needed at this time. Thanks for the consult.  Marvel Plan, MD PhD Stroke Neurology 03/11/2020 7:26 PM   To contact Stroke Continuity provider, please refer to [email or web address]. After hours, contact General Neurology

## 2020-03-11 NOTE — TOC Transition Note (Signed)
Transition of Care [facility]) - CM/SW Discharge Note   Patient Details  Name: Jeff [name] MRN: [id number] Date of Birth: 03/08/1951  [facility] CM/SW Contact:  Kermit Balo, RN Phone Number: 03/11/2020, 1:40 PM   Clinical Narrative:    Pt is discharging home with self care. No needs per TOC.   Final next level of care: Home/Self Care Barriers to Discharge: No Barriers Identified   Patient Goals and CMS Choice        Discharge Placement                       Discharge Plan and Services                                     Social Determinants of Health (SDOH) Interventions     Readmission Risk Interventions No flowsheet data found.

## 2020-03-11 NOTE — Procedures (Signed)
Patient Name: AARYA QUEBEDEAUX  MRN: [id number]  Epilepsy Attending: Charlsie Quest  Referring Physician/Provider: Dr. Marvel Plan Date: 03/12/2019 Duration: 24.41 minutes  Patient history: 69 year old male presented with transient expressive and receptive aphasia.  EEG evaluate for seizures.  Level of alertness: Awake, asleep  AEDs during EEG study: None  Technical aspects: This EEG study was done with scalp electrodes positioned according to the 10-20 International system of electrode placement. Electrical activity was acquired at a sampling rate of 500Hz  and reviewed with a high frequency filter of 70Hz  and a low frequency filter of 1Hz . EEG data were recorded continuously and digitally stored.   Description: The posterior dominant rhythm consists of 9 Hz activity of moderate voltage (25-35 uV) seen predominantly in posterior head regions, symmetric and reactive to eye opening and eye closing. Sleep was characterized by vertex waves, sleep spindles (12 to 14 Hz), maximal frontocentral region. Physiologic photic driving was not seen during photic stimulation.  Hyperventilation was not performed.     IMPRESSION: This study is within normal limits. No seizures or epileptiform discharges were seen throughout the recording.  Ravenne Wayment 

## 2020-03-11 NOTE — Progress Notes (Signed)
EEG complete - results pending 

## 2020-03-11 NOTE — Evaluation (Signed)
Occupational Therapy Evaluation and Discharge from OT Patient Details Name: Jeff [name] MRN: [id number] DOB: 1950-09-17 Today's Date: 03/11/2020    History of Present Illness Jeff [name] is a 69 y.o. male with history of CVA on Plavix, IDDM-2, HTN, HLD, CKD-3A and obesity brought to ED with right-sided weakness and aphasia (resolved with EMS en route). MRI and CT with no acute findings   Clinical Impression   PTA Pt independent in ADL and mobility. Pt drives, active in [place], and says that walking on uneven/unsteady surfaces (sand) is hard since his last CVA. Today Pt reports that he is at his baseline and is able to complete UB and LB ADL, standing grooming tasks at sink at independent level without increased time or assist needed. MMT revelaed symmetrical strength and full ROM in BUE. Education on BEFA[employer] and Pt verbalized understanding. Pt with no further questions or concerns at the end of session, education and evaluation complete. OT to sign off at this time.     Follow Up Recommendations  No OT follow up    Equipment Recommendations  None recommended by OT    Recommendations for Other Services       Precautions / Restrictions Precautions Precautions: Fall Restrictions Weight Bearing Restrictions: No      Mobility Bed Mobility Overal bed mobility: Independent                Transfers Overall transfer level: Independent Equipment used: None             General transfer comment: Pt able to come to stand in appropriate amount of time safely without unsteadiness.    Balance Overall balance assessment: Mild deficits observed, not formally tested                               Standardized Balance Assessment Standardized Balance Assessment : Dynamic Gait Index   Dynamic Gait Index Level Surface: Normal Change in Gait Speed: Normal Gait with Horizontal Head Turns: Mild Impairment Gait with Vertical Head Turns: Mild Impairment Gait and Pivot Turn:  Normal Step Over Obstacle: Mild Impairment Step Around Obstacles: Normal Steps: Mild Impairment Total Score: 20     ADL either performed or assessed with clinical judgement   ADL Overall ADL's : At baseline                                       General ADL Comments: Pt able to demonstrate UB and LB ADL standing grooming at sink, room mobility at independent level     Vision Patient Visual Report: No change from baseline Vision Assessment?: No apparent visual deficits     Perception     Praxis      Pertinent Vitals/Pain Pain Assessment: No/denies pain     Hand Dominance Right   Extremity/Trunk Assessment Upper Extremity Assessment Upper Extremity Assessment: Overall WFL for tasks assessed   Lower Extremity Assessment Lower Extremity Assessment: Defer to PT evaluation       Communication Communication Communication: No difficulties   Cognition Arousal/Alertness: Awake/alert Behavior During Therapy: WFL for tasks assessed/performed Overall Cognitive Status: Within Functional Limits for tasks assessed                                 General Comments: A&O x4  and aware of deficits and safety concerns   General Comments       Exercises     Shoulder Instructions      Home Living Family/patient expects to be discharged to:: Private residence Living Arrangements: Spouse/significant other Available Help at Discharge: Family;Available 24 hours/day Type of Home: House Home Access: Stairs to enter;Ramped entrance Entrance Stairs-Number of Steps: 3 Entrance Stairs-Rails:  (unknown) Home Layout: One level     Bathroom Shower/Tub: Tub/shower unit;Walk-in shower (handi-cap accessible bathroom available)   Bathroom Toilet: Standard Bathroom Accessibility: Yes   Home Equipment: Walker - 2 wheels;Cane - single point;Bedside commode;Shower seat;Wheelchair - manual (grab bars in bathroom)   Additional Comments: Pt still driving but  retired.      Prior Functioning/Environment Level of Independence: Independent        Comments: Pt reports some balance deficits at baseline, esp on uneven surfaces, from previous stroke.         OT Problem List: Impaired balance (sitting and/or standing)      OT Treatment/Interventions:      OT Goals(Current goals can be found in the care plan section) Acute Rehab OT Goals Patient Stated Goal: to go home and to beach OT Goal Formulation: With patient Time For Goal Achievement: 03/25/20 Potential to Achieve Goals: Good  OT Frequency:     Barriers to D/C:            Co-evaluation              AM-PAC OT "6 Clicks" Daily Activity     Outcome Measure Help from another person eating meals?: None Help from another person taking care of personal grooming?: None Help from another person toileting, which includes using toliet, bedpan, or urinal?: None Help from another person bathing (including washing, rinsing, drying)?: None Help from another person to put on and taking off regular upper body clothing?: None Help from another person to put on and taking off regular lower body clothing?: None 6 Click Score: 24   End of Session Nurse Communication: Mobility status;Other (comment) (signing off)  Activity Tolerance: Patient tolerated treatment well Patient left: in bed;with call bell/phone within reach  OT Visit Diagnosis: Other symptoms and signs involving the nervous system (R29.898)                Time: 0930-1004 OT Time Calculation (min): 34 min Charges:  OT General Charges $OT Visit: 1 Visit OT Evaluation $OT Eval Low Complexity: 1 Low  Nyoka Cowden OTR/L Acute Rehabilitation Services Pager: [phone] Office: [phone]  [employer] Shayli Altemose 03/11/2020, 11:30 AM

## 2020-03-11 NOTE — Plan of Care (Signed)
  Problem: Education: Goal: Knowledge of General Education information will improve Description: Including pain rating scale, medication(s)/side effects and non-pharmacologic comfort measures Outcome: Adequate for Discharge   Problem: Health Behavior/Discharge Planning: Goal: Ability to manage health-related needs will improve Outcome: Adequate for Discharge   Problem: Clinical Measurements: Goal: Ability to maintain clinical measurements within normal limits will improve Outcome: Adequate for Discharge   Problem: Activity: Goal: Risk for activity intolerance will decrease Outcome: Adequate for Discharge   Problem: Nutrition: Goal: Adequate nutrition will be maintained Outcome: Adequate for Discharge   Problem: Coping: Goal: Level of anxiety will decrease Outcome: Adequate for Discharge   Problem: Elimination: Goal: Will not experience complications related to bowel motility Outcome: Adequate for Discharge   Problem: Pain Managment: Goal: General experience of comfort will improve Outcome: Adequate for Discharge   Problem: Safety: Goal: Ability to remain free from injury will improve Outcome: Adequate for Discharge   Problem: Skin Integrity: Goal: Risk for impaired skin integrity will decrease Outcome: Adequate for Discharge   Problem: Education: Goal: Knowledge of disease or condition will improve Outcome: Adequate for Discharge Goal: Knowledge of secondary prevention will improve Outcome: Adequate for Discharge Goal: Knowledge of patient specific risk factors addressed and post discharge goals established will improve Outcome: Adequate for Discharge Goal: Individualized Educational Video(s) Outcome: Adequate for Discharge   Problem: Coping: Goal: Will verbalize positive feelings about self Outcome: Adequate for Discharge   Problem: Health Behavior/Discharge Planning: Goal: Ability to manage health-related needs will improve Outcome: Adequate for Discharge    Problem: Self-Care: Goal: Ability to participate in self-care as condition permits will improve Outcome: Adequate for Discharge   Problem: Nutrition: Goal: Risk of aspiration will decrease Outcome: Adequate for Discharge   Problem: Ischemic Stroke/TIA Tissue Perfusion: Goal: Complications of ischemic stroke/TIA will be minimized Outcome: Adequate for Discharge

## 2020-03-11 NOTE — Discharge Summary (Signed)
Physician Discharge Summary  Jeff [name] ZOX:[id number] DOB: [birth year] DOA: 03/10/2020  PCP: Shade Flood, MD  Admit date: 03/10/2020 Discharge date: 03/11/2020  Admitted From: Home Disposition: Home  Recommendations for Outpatient Follow-up:  1. Follow up with PCP in 1-2 weeks 2. Follow up with Cardiology in the outpatient setting  3. Please obtain CMP/CBC, Mag, Phos in one week 4. Please follow up on the following pending results:  Home Health: No  Equipment/Devices: None  Discharge Condition: Stable CODE STATUS: FULL CODE Diet recommendation: Heart Healthy Carb Modified Diet   Brief/Interim Summary: HPI per Dr. Candelaria Stagers on 03/10/20 Jeff [name] is a 69 y.o. male with history of CVA on Plavix, IDDM-2, HTN, HLD, CKD-3A and obesity brought to ED by [facility] with strokelike symptoms.  History provided by patient and patient's daughter over the phone. Patient was with his family at a restaurant eating lunch when he suddenly became "emotional" and started like crying.  He could not talk but pointed to his face.  He nodded yes when his daughter asked about facial numbness.  He also could not lift his right arm.  Family called EMS and he was brought to ED.  Patient's blood glucose was 48 on blood glucose monitor. They gave him some juice.  Repeat CBG by EMS was 140.  Per EMS report EDP, symptoms resolved in route to ED at about 1230.   In ED, code stroke activated.  NIHSS score 0.  CT head without contrast without acute finding.  CTA head and neck without large vessel obstruction.  Neurology did not feel TPA was indicated but recommended admission for MRI brain, echocardiogram and close monitoring.  Hospitalist service called for admission.  Patient denies headache, vision change, focal neuro symptoms, chest pain, dyspnea, URI symptoms, GI or UTI symptoms.  Patient denies ever smoking cigarettes.  Denies drinking alcohol recreational drug use.  He says he is DNR/DNI.  **Interim  History TIA/stroke work-up initiated and came back and MRI did not reveal an acute CVA.  EEG was normal.  Patient felt back to his baseline and likely this was secondary to hypoglycemia.  His insulin has been adjusted and reduced down to 25 units twice daily instead of the 45 units in the a.m. and 55 units in the p.m.  Neurology cleared the patient for discharge and he will need to follow-up with PCP, cardiologist and was nephrologist in the outpatient setting.  Discharge Diagnoses:  Active Problems:   TIA (transient ischemic attack)  Hypoglycemic episode mimicking a TIA -Reportedly CBG 48 on his monitor but resolved.  His neuro symptoms resolve as well. NIHSS score 0. CTH and CTA head and neck with new moderate stenosis of right PCA P1 segment and stable arthrosclerotic changes but no large vessel occlusion.   -Patient is already on Plavix.  Reports good compliance with his medication. -Neurology following -MRI brain without contrast did not show CVA -ECHO showed Diastolic Dysfunction -EEG Normal -Frequent neuro monitoring -Risk stratification labs -Continue Plavix and statin at D/C -PT/OT/SLP recommending no Follow up  History of CVA on Plavix -Management as above  IDDM-2 with hypoglycemia and hyperglycemia: On Lantus, SSI and Metformin Last Labs       Recent Labs  Lab 03/10/20 1238 03/10/20 1342  GLUCAP 171* 189*    -Check hemoglobin A1c -Lantus 25 units twice daily instead of Home Dose -SSI moderate -Continue home statin  Essential hypertension: Normotensive -Continue home meds.  Doubt need for permissive hypertension.  CKD-3A: Stable.  Renal function at baseline. -Continue monitoring  Hypothyroidism -Continue home Synthroid  Obesity: -Estimated body mass index is 32.11 kg/m as calculated from the following:   Height as of this encounter:  (1.727 m).   Weight as of this encounter: 95.8 kg. -Encourage lifestyle change to lose weight -Could benefit  from GLP-1 inhibitors.  Discharge Instructions Discharge Instructions    Call MD for:  difficulty breathing, headache or visual disturbances   Complete by: As directed    Call MD for:  extreme fatigue   Complete by: As directed    Call MD for:  hives   Complete by: As directed    Call MD for:  persistant dizziness or light-headedness   Complete by: As directed    Call MD for:  persistant nausea and vomiting   Complete by: As directed    Call MD for:  redness, tenderness, or signs of infection (pain, swelling, redness, odor or green/yellow discharge around incision site)   Complete by: As directed    Call MD for:  severe uncontrolled pain   Complete by: As directed    Call MD for:  temperature >100.4   Complete by: As directed    Diet - low sodium heart healthy   Complete by: As directed    Diet Carb Modified   Complete by: As directed    Discharge instructions   Complete by: As directed    You were cared for by a hospitalist during your hospital stay. If you have any questions about your discharge medications or the care you received while you were in the hospital after you are discharged, you can call the unit and ask to speak with the hospitalist on call if the hospitalist that took care of you is not available. Once you are discharged, your primary care physician will handle any further medical issues. Please note that NO REFILLS for any discharge medications will be authorized once you are discharged, as it is imperative that you return to your primary care physician (or establish a relationship with a primary care physician if you do not have one) for your aftercare needs so that they can reassess your need for medications and monitor your lab values.  Follow up with PCP as an outpatient. Take all medications as prescribed. If symptoms change or worsen please return to the ED for evaluation   Increase activity slowly   Complete by: As directed      Allergies as of 03/11/2020       Reactions   Flexeril [cyclobenzaprine Hcl] Other (See Comments)   Causes him to pass out and lowers his BP   Lidocaine Swelling   throat   Novocain [procaine Hcl] Swelling      Medication List    TAKE these medications   acetaminophen 325 MG tablet Commonly known as: TYLENOL Take 1-2 tablets (325-650 mg total) by mouth every 4 (four) hours as needed for mild pain.   clopidogrel 75 MG tablet Commonly known as: PLAVIX TAKE 1 TABLET BY MOUTH DAILY   glucose blood test strip Commonly known as: OneTouch Verio 1 each by Other route 4 (four) times daily -  before meals and at bedtime. What changed: additional instructions   insulin glargine 100 UNIT/ML injection Commonly known as: LANTUS Inject 0.25 mLs (25 Units total) into the skin 2 (two) times daily. What changed:   how much to take  additional instructions   Insulin Syringe-Needle U-100 31G X 5/16" 1 ML Misc Commonly known as:  B-D INS SYR ULTRAFINE 1CC/31G Used to inject insulin twice daily.   levothyroxine 50 MCG tablet Commonly known as: SYNTHROID Take 50 mcg by mouth daily before breakfast.   lisinopril 2.5 MG tablet Commonly known as: ZESTRIL Take 2.5 mg by mouth daily.   metFORMIN 500 MG 24 hr tablet Commonly known as: GLUCOPHAGE-XR Take 500 mg by mouth in the morning and at bedtime.   pantoprazole 40 MG tablet Commonly known as: PROTONIX TAKE 1 TABLET BY MOUTH AT BEDTIME   rosuvastatin 40 MG tablet Commonly known as: CRESTOR TAKE 1 TABLET BY MOUTH DAILY   senna-docusate 8.6-50 MG tablet Commonly known as: Senokot-S Take 1 tablet by mouth at bedtime as needed for mild constipation.       Follow-up Information    Shade Flood, MD. Call.   Specialties: Family Medicine, Sports Medicine Why: Follow up within 1-2 weeks Contact information: [address] [place] [address] [phone]              Allergies  Allergen Reactions  . Flexeril [Cyclobenzaprine Hcl] Other (See  Comments)    Causes him to pass out and lowers his BP  . Lidocaine Swelling    throat  . Novocain [Procaine Hcl] Swelling    Consultations: Neurology  Procedures/Studies: MR BRAIN WO CONTRAST  Result Date: 03/10/2020 CLINICAL DATA:  TIA. Transient aphasia, facial numbness, and arm weakness. EXAM: MRI HEAD WITHOUT CONTRAST TECHNIQUE: Multiplanar, multiecho pulse sequences of the brain and surrounding structures were obtained without intravenous contrast. COMPARISON:  Head CT and CTA 03/10/2020 and head MRI 08/10/2018 FINDINGS: Brain: There is no evidence of an acute infarct, intracranial hemorrhage, mass, midline shift, or extra-axial fluid collection. Patchy T2 hyperintensities in the cerebral white matter and pons are unchanged from the prior MRI and are nonspecific but compatible with moderate chronic small vessel ischemic disease. There is moderate central predominant cerebral atrophy. Vascular: Occlusion of the distal left vertebral artery as shown on today's earlier CTA. Skull and upper cervical spine: Unremarkable bone marrow signal para Sinuses/Orbits: Bilateral cataract extraction. Minimal mucosal thickening in the paranasal sinuses. Trace right mastoid fluid. Other: None. IMPRESSION: 1. No acute intracranial abnormality. 2. Moderate chronic small vessel ischemic disease and cerebral atrophy. Electronically Signed   By: Sebastian Ache M.D.   On: 03/10/2020 16:07   EEG adult  Result Date: 03/11/2020 Charlsie Quest, MD     03/11/2020 12:27 PM Patient Name: Jeff [name] MRN: [id number] Epilepsy Attending: Charlsie Quest Referring Physician/Provider: Dr. Marvel Plan Date: 03/12/2019 Duration: 24.41 minutes Patient history: 69 year old male presented with transient expressive and receptive aphasia.  EEG evaluate for seizures. Level of alertness: Awake, asleep AEDs during EEG study: None Technical aspects: This EEG study was done with scalp electrodes positioned according to the 10-20 International  system of electrode placement. Electrical activity was acquired at a sampling rate of 500Hz  and reviewed with a high frequency filter of 70Hz  and a low frequency filter of 1Hz . EEG data were recorded continuously and digitally stored. Description: The posterior dominant rhythm consists of 9 Hz activity of moderate voltage (25-35 uV) seen predominantly in posterior head regions, symmetric and reactive to eye opening and eye closing. Sleep was characterized by vertex waves, sleep spindles (12 to 14 Hz), maximal frontocentral region. Physiologic photic driving was not seen during photic stimulation.  Hyperventilation was not performed.   IMPRESSION: This study is within normal limits. No seizures or epileptiform discharges were seen throughout the recording. Priyanka  ECHOCARDIOGRAM COMPLETE  Result Date: 03/10/2020    ECHOCARDIOGRAM REPORT   Patient Name:   Jeff [name] Date of Exam: 03/10/2020 Medical Rec #:  [id number]  Height:       68.0 in Accession #:    [id number] Weight:       211.2 lb Date of Birth:  [birth year]  BSA:          2.092 m Patient Age:    69 years   BP:           120/75 mmHg Patient Gender: M          HR:           61 bpm. Exam Location:  Inpatient Procedure: 2D Echo, Cardiac Doppler, Color Doppler and Intracardiac            Opacification Agent Indications:    TIA  History:        Patient has no prior history of Echocardiogram examinations,                 most recent 08/08/2018. Risk Factors:Diabetes and Dyslipidemia.                 CKD.  Sonographer:    Ross Ludwig RDCS (AE) Referring Phys: [id number] Almon Hercules  Sonographer Comments: Suboptimal parasternal window and suboptimal apical window. IMPRESSIONS  1. Left ventricular ejection fraction, by estimation, is 60 to 65%. The left ventricle has normal function. The left ventricle has no regional wall motion abnormalities. Left ventricular diastolic parameters are consistent with Grade I diastolic dysfunction (impaired relaxation).  2.  Right ventricular systolic function is normal. The right ventricular size is normal. There is normal pulmonary artery systolic pressure. The estimated right ventricular systolic pressure is 25.1 mmHg.  3. The mitral valve is normal in structure. Trivial mitral valve regurgitation. No evidence of mitral stenosis.  4. The aortic valve is tricuspid. Aortic valve regurgitation is not visualized. Mild aortic valve sclerosis is present, with no evidence of aortic valve stenosis.  5. The inferior vena cava is normal in size with greater than 50% respiratory variability, suggesting right atrial pressure of 3 mmHg. FINDINGS  Left Ventricle: Left ventricular ejection fraction, by estimation, is 60 to 65%. The left ventricle has normal function. The left ventricle has no regional wall motion abnormalities. Definity contrast agent was given IV to delineate the left ventricular  endocardial borders. The left ventricular internal cavity size was normal in size. There is no left ventricular hypertrophy. Left ventricular diastolic parameters are consistent with Grade I diastolic dysfunction (impaired relaxation). Right Ventricle: The right ventricular size is normal. No increase in right ventricular wall thickness. Right ventricular systolic function is normal. There is normal pulmonary artery systolic pressure. The tricuspid regurgitant velocity is 2.35 m/s, and  with an assumed right atrial pressure of 3 mmHg, the estimated right ventricular systolic pressure is 25.1 mmHg. Left Atrium: Left atrial size was normal in size. Right Atrium: Right atrial size was normal in size. Pericardium: There is no evidence of pericardial effusion. Mitral Valve: The mitral valve is normal in structure. There is mild calcification of the mitral valve leaflet(s). Trivial mitral valve regurgitation. No evidence of mitral valve stenosis. MV peak gradient, 5.1 mmHg. The mean mitral valve gradient is 2.0  mmHg. Tricuspid Valve: The tricuspid valve is  normal in structure. Tricuspid valve regurgitation is trivial. Aortic Valve: The aortic valve is tricuspid. Aortic valve regurgitation is not visualized. Mild aortic valve sclerosis is present,  with no evidence of aortic valve stenosis. Aortic valve mean gradient measures 4.0 mmHg. Aortic valve peak gradient measures 6.9 mmHg. Aortic valve area, by VTI measures 2.29 cm. Pulmonic Valve: The pulmonic valve was normal in structure. Pulmonic valve regurgitation is not visualized. Aorta: The aortic root is normal in size and structure. Venous: The inferior vena cava is normal in size with greater than 50% respiratory variability, suggesting right atrial pressure of 3 mmHg. IAS/Shunts: No atrial level shunt detected by color flow Doppler.  LEFT VENTRICLE PLAX 2D LVIDd:         4.40 cm  Diastology LVIDs:         3.00 cm  LV e' medial:    7.18 cm/s LV PW:         1.20 cm  LV E/e' medial:  15.2 LV IVS:        1.00 cm  LV e' lateral:   5.98 cm/s LVOT diam:     1.90 cm  LV E/e' lateral: 18.2 LV SV:         64 LV SV Index:   30 LVOT Area:     2.84 cm  RIGHT VENTRICLE RV Basal diam:  2.70 cm RV S prime:     10.00 cm/s TAPSE (M-mode): 2.7 cm LEFT ATRIUM             Index       RIGHT ATRIUM           Index LA diam:        3.30 cm 1.58 cm/m  RA Area:     11.00 cm LA Vol (A2C):   73.3 ml 35.04 ml/m RA Volume:   21.90 ml  10.47 ml/m LA Vol (A4C):   43.0 ml 20.56 ml/m LA Biplane Vol: 57.3 ml 27.39 ml/m  AORTIC VALVE AV Area (Vmax):    2.52 cm AV Area (Vmean):   2.30 cm AV Area (VTI):     2.29 cm AV Vmax:           131.50 cm/s AV Vmean:          90.800 cm/s AV VTI:            0.277 m AV Peak Grad:      6.9 mmHg AV Mean Grad:      4.0 mmHg LVOT Vmax:         117.00 cm/s LVOT Vmean:        73.800 cm/s LVOT VTI:          0.224 m LVOT/AV VTI ratio: 0.81  AORTA Ao Root diam: 3.30 cm Ao Asc diam:  3.10 cm MITRAL VALVE                TRICUSPID VALVE MV Area (PHT): 2.91 cm     TR Peak grad:   22.1 mmHg MV Peak grad:  5.1 mmHg      TR Vmax:        235.00 cm/s MV Mean grad:  2.0 mmHg MV Vmax:       1.13 m/s     SHUNTS MV Vmean:      63.8 cm/s    Systemic VTI:  0.22 m MV Decel Time: 261 msec     Systemic Diam: 1.90 cm MV E velocity: 109.00 cm/s MV A velocity: 112.00 cm/s MV E/A ratio:  0.97 Marca Ancona MD Electronically signed by Marca Ancona MD Signature Date/Time: 03/10/2020/6:21:51 PM    Final    CT HEAD CODE STROKE  WO CONTRAST  Result Date: 03/10/2020 CLINICAL DATA:  Code stroke.  Abnormal gaze and aphasia EXAM: CT HEAD WITHOUT CONTRAST TECHNIQUE: Contiguous axial images were obtained from the base of the skull through the vertex without intravenous contrast. COMPARISON:  March 2020 FINDINGS: Brain: No acute intracranial hemorrhage, mass effect, or edema. No new loss of gray-white differentiation. Prominence of the ventricles and sulci reflects stable parenchymal volume loss. Patchy and confluent areas of hypoattenuation in the supratentorial white matter is nonspecific but probably reflects similar moderate chronic microvascular ischemic changes. No extra-axial fluid collection. Vascular: No hyperdense vessel. There is intracranial atherosclerotic calcification at the skull base. Skull: Unremarkable. Sinuses/Orbits: No acute abnormality. Other: Mastoid air cells are clear. ASPECTS (Alberta Stroke Program Early CT Score) - Ganglionic level infarction (caudate, lentiform nuclei, internal capsule, insula, M1-M3 cortex): 7 - Supraganglionic infarction (M4-M6 cortex): 3 Total score (0-10 with 10 being normal): 10 IMPRESSION: No acute intracranial hemorrhage or evidence of acute infarction. ASPECT score is 10. These results were communicated to Dr. Otelia Limes at 12:54 pm on 03/10/2020 by text page via the [facility] messaging system. Electronically Signed   By: Guadlupe Spanish M.D.   On: 03/10/2020 12:58   CT ANGIO HEAD CODE STROKE  Result Date: 03/10/2020 CLINICAL DATA:  69 year old male with abnormal gaze and aphasia. EXAM: CT ANGIOGRAPHY HEAD  AND NECK TECHNIQUE: Multidetector CT imaging of the head and neck was performed using the standard protocol during bolus administration of intravenous contrast. Multiplanar CT image reconstructions and MIPs were obtained to evaluate the vascular anatomy. Carotid stenosis measurements (when applicable) are obtained utilizing NASCET criteria, using the distal internal carotid diameter as the denominator. CONTRAST:  69mL OMNIPAQUE IOHEXOL 350 MG/ML SOLN COMPARISON:  Plain head CT 1252 hours today. Brain MRI 08/10/2018. CTA head and neck 08/08/2018. FINDINGS: CTA NECK Skeleton: Unchanged prior cervical ACDF including anteriorly protruding C7 screws. No acute osseous abnormality identified. Upper chest: No acute findings. Other neck: No acute findings. Aortic arch: Stable 3 vessel arch with minimal atherosclerosis. Right carotid system: Stable and patent with minor soft plaque at the bifurcation, no stenosis. Left carotid system: Stable and patent with minimal soft plaque at the left ICA origin. Vertebral arteries: Stable proximal right subclavian artery and right vertebral artery origin with minimal plaque and no significant stenosis. Dominant right vertebral artery with stable caliber and no stenosis to the skull base. Stable proximal left subclavian artery with only mild plaque and no stenosis. But suspected moderate stenosis at the left vertebral artery origin due to soft plaque (series 8, image 130) is stable. The left vertebral remains patent to the skull base without additional stenosis. CTA HEAD Posterior circulation: Left V4 segment occlusion redemonstrated. Left PICA seems to remain patent via collaterals as before. Faint reconstitution of the left vertebrobasilar junction is unchanged, and this appears to be associated with a fenestrated proximal basilar. Stable dominant appearing distal right vertebral artery with mild calcified plaque but no stenosis. Patent right PICA origin which is somewhat early. Basilar  artery is stable and patent with mild distal basilar irregularity, no significant stenosis. SCA and PCA origins remain patent. But there is new moderate right P1 stenosis on series 9, image 27. Stable left P1. Stable superimposed mild bilateral PCA P2 irregularity and stenosis, greater on the right. Anterior circulation: Stable ICA siphons which are heavily calcified. On the left moderate to severe anterior genu and supraclinoid stenosis is stable. On the right severe cavernous segment and supraclinoid segment stenoses appears stable (series 8, image  109 and 111). Carotid termini, MCA and ACA origins are stable and patent. Dominant right A1. Bilateral ACA branches are stable. Normal anterior communicating artery with median artery of the corpus callosum. Left MCA M1 segment and trifurcation are patent without stenosis. Left MCA branches are stable with mild irregularity. Right MCA M1 segment and trifurcation are patent without stenosis. Right MCA branches are stable with mild irregularity. Venous sinuses: Early contrast timing, but the major dural venous sinuses appear to be patent. The right transverse and sigmoid are dominant as before. Anatomic variants: Dominant right vertebral artery. Dominant right ACA A1. Review of the MIP images confirms the above findings IMPRESSION: 1. Negative for large vessel occlusion 2. Positive for stable advanced ICA siphon atherosclerosis with Severe Right > Left Siphon stenosis. 3. 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 Left PICA remains patent. 4. New Moderate stenosis of the Right PCA P1 segment. 5. Otherwise stable since the 2020 CTA, intracranial > extracranial atherosclerosis. These results were communicated to Dr. Otelia Limes at 1:19 pmon 10/7/2021by text page via the [facility] messaging system. Electronically Signed   By: Odessa Fleming M.D.   On: 03/10/2020 13:20   CT ANGIO NECK CODE STROKE  Result Date: 03/10/2020 CLINICAL DATA:  69 year old male with abnormal gaze and  aphasia. EXAM: CT ANGIOGRAPHY HEAD AND NECK TECHNIQUE: Multidetector CT imaging of the head and neck was performed using the standard protocol during bolus administration of intravenous contrast. Multiplanar CT image reconstructions and MIPs were obtained to evaluate the vascular anatomy. Carotid stenosis measurements (when applicable) are obtained utilizing NASCET criteria, using the distal internal carotid diameter as the denominator. CONTRAST:  50mL OMNIPAQUE IOHEXOL 350 MG/ML SOLN COMPARISON:  Plain head CT 1252 hours today. Brain MRI 08/10/2018. CTA head and neck 08/08/2018. FINDINGS: CTA NECK Skeleton: Unchanged prior cervical ACDF including anteriorly protruding C7 screws. No acute osseous abnormality identified. Upper chest: No acute findings. Other neck: No acute findings. Aortic arch: Stable 3 vessel arch with minimal atherosclerosis. Right carotid system: Stable and patent with minor soft plaque at the bifurcation, no stenosis. Left carotid system: Stable and patent with minimal soft plaque at the left ICA origin. Vertebral arteries: Stable proximal right subclavian artery and right vertebral artery origin with minimal plaque and no significant stenosis. Dominant right vertebral artery with stable caliber and no stenosis to the skull base. Stable proximal left subclavian artery with only mild plaque and no stenosis. But suspected moderate stenosis at the left vertebral artery origin due to soft plaque (series 8, image 130) is stable. The left vertebral remains patent to the skull base without additional stenosis. CTA HEAD Posterior circulation: Left V4 segment occlusion redemonstrated. Left PICA seems to remain patent via collaterals as before. Faint reconstitution of the left vertebrobasilar junction is unchanged, and this appears to be associated with a fenestrated proximal basilar. Stable dominant appearing distal right vertebral artery with mild calcified plaque but no stenosis. Patent right PICA  origin which is somewhat early. Basilar artery is stable and patent with mild distal basilar irregularity, no significant stenosis. SCA and PCA origins remain patent. But there is new moderate right P1 stenosis on series 9, image 27. Stable left P1. Stable superimposed mild bilateral PCA P2 irregularity and stenosis, greater on the right. Anterior circulation: Stable ICA siphons which are heavily calcified. On the left moderate to severe anterior genu and supraclinoid stenosis is stable. On the right severe cavernous segment and supraclinoid segment stenoses appears stable (series 8, image 109 and 111). Carotid termini,  MCA and ACA origins are stable and patent. Dominant right A1. Bilateral ACA branches are stable. Normal anterior communicating artery with median artery of the corpus callosum. Left MCA M1 segment and trifurcation are patent without stenosis. Left MCA branches are stable with mild irregularity. Right MCA M1 segment and trifurcation are patent without stenosis. Right MCA branches are stable with mild irregularity. Venous sinuses: Early contrast timing, but the major dural venous sinuses appear to be patent. The right transverse and sigmoid are dominant as before. Anatomic variants: Dominant right vertebral artery. Dominant right ACA A1. Review of the MIP images confirms the above findings IMPRESSION: 1. Negative for large vessel occlusion 2. Positive for stable advanced ICA siphon atherosclerosis with Severe Right > Left Siphon stenosis. 3. 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 Left PICA remains patent. 4. New Moderate stenosis of the Right PCA P1 segment. 5. Otherwise stable since the 2020 CTA, intracranial > extracranial atherosclerosis. These results were communicated to Dr. Otelia Limes at 1:19 pmon 10/7/2021by text page via the [facility] messaging system. Electronically Signed   By: Odessa Fleming M.D.   On: 03/10/2020 13:20    Subjective: Seen and examined at bedside and he was doing much better.   Denies chest pain, lightheadedness or dizziness.  No nausea or vomiting.  Denies any other concerns or complaints at this time and felt back to baseline.  Discharge Exam: Vitals:   03/11/20 0936 03/11/20 1200  BP: 128/89 134/77  Pulse: 70 63  Resp: 20 20  Temp: (!) 97.5 F (36.4 C) (!) 97.5 F (36.4 C)  SpO2: 99% 99%   Vitals:   03/11/20 0008 03/11/20 0403 03/11/20 0936 03/11/20 1200  BP: 109/73 130/71 128/89 134/77  Pulse: 65 67 70 63  Resp: 18 17 20 20   Temp: 97.8 F (36.6 C) 97.8 F (36.6 C) (!) 97.5 F (36.4 C) (!) 97.5 F (36.4 C)  TempSrc: Oral Oral Oral Oral  SpO2: 99% 100% 99% 99%  Weight:      Height:       General: Pt is alert, awake, not in acute distress Cardiovascular: RRR, S1/S2 +, no rubs, no gallops Respiratory: CTA bilaterally, no wheezing, no rhonchi Abdominal: Soft, NT, distended secondary body habitus, bowel sounds + Extremities: no edema, no cyanosis  The results of significant diagnostics from this hospitalization (including imaging, microbiology, ancillary and laboratory) are listed below for reference.    Microbiology: Recent Results (from the past 240 hour(s))  Respiratory Panel by RT PCR (Flu A&B, Covid) - Nasopharyngeal Swab     Status: None   Collection Time: 03/10/20  5:51 PM   Specimen: Nasopharyngeal Swab  Result Value Ref Range Status   SARS Coronavirus 2 by RT PCR NEGATIVE NEGATIVE Final    Comment: (NOTE) SARS-CoV-2 target nucleic acids are NOT DETECTED.  The SARS-CoV-2 RNA is generally detectable in upper respiratoy specimens during the acute phase of infection. The lowest concentration of SARS-CoV-2 viral copies this assay can detect is 131 copies/mL. A negative result does not preclude SARS-Cov-2 infection and should not be used as the sole basis for treatment or other patient management decisions. A negative result may occur with  improper specimen collection/handling, submission of specimen other than nasopharyngeal swab,  presence of viral mutation(s) within the areas targeted by this assay, and inadequate number of viral copies (<131 copies/mL). A negative result must be combined with clinical observations, patient history, and epidemiological information. The expected result is Negative.  Fact Sheet for Patients:  https://www.moore.com/  Fact Sheet for Healthcare Providers:  https://www.young.biz/  This test is no t yet approved or cleared by the [place] FDA and  has been authorized for detection and/or diagnosis of SARS-CoV-2 by FDA under an Emergency Use Authorization (EUA). This EUA will remain  in effect (meaning this test can be used) for the duration of the COVID-19 declaration under Section 564(b)(1) of the Act, 21 U.S.C. section 360bbb-3(b)(1), unless the authorization is terminated or revoked sooner.     Influenza A by PCR NEGATIVE NEGATIVE Final   Influenza B by PCR NEGATIVE NEGATIVE Final    Comment: (NOTE) The Xpert Xpress SARS-CoV-2/FLU/RSV assay is intended as an aid in  the diagnosis of influenza from Nasopharyngeal swab specimens and  should not be used as a sole basis for treatment. Nasal washings and  aspirates are unacceptable for Xpert Xpress SARS-CoV-2/FLU/RSV  testing.  Fact Sheet for Patients: https://www.moore.com/  Fact Sheet for Healthcare Providers: https://www.young.biz/  This test is not yet approved or cleared by the [place] FDA and  has been authorized for detection and/or diagnosis of SARS-CoV-2 by  FDA under an Emergency Use Authorization (EUA). This EUA will remain  in effect (meaning this test can be used) for the duration of the  Covid-19 declaration under Section 564(b)(1) of the Act, 21  U.S.C. section 360bbb-3(b)(1), unless the authorization is  terminated or revoked. Performed at [facility], [address], [place], [place] [address]     Labs: BNP  (last 3 results) No results for input(s): BNP in the last 8760 hours. Basic Metabolic Panel: Recent Labs  Lab 03/10/20 1238 03/10/20 1244 03/11/20 0505  NA 138 139 138  K 4.1 4.1 4.1  CL 106 105 105  CO2 20*  --  24  GLUCOSE 166* 164* 187*  BUN 13 14 15   CREATININE 1.48* 1.50* 1.42*  CALCIUM 8.8*  --  8.9  MG  --   --  1.8  PHOS  --   --  3.7   Liver Function Tests: Recent Labs  Lab 03/10/20 1238 03/11/20 0505  AST 27 18  ALT 27 24  ALKPHOS 47 42  BILITOT 0.6 0.3  PROT 6.7 6.0*  ALBUMIN 3.9 3.4*   No results for input(s): LIPASE, AMYLASE in the last 168 hours. No results for input(s): AMMONIA in the last 168 hours. CBC: Recent Labs  Lab 03/10/20 1238 03/10/20 1244 03/11/20 0505  WBC 6.5  --  6.1  NEUTROABS 3.8  --  3.8  HGB 14.0 13.3 12.5*  HCT 42.4 39.0 38.3*  MCV 87.6  --  86.1  PLT 201  --  203   Cardiac Enzymes: No results for input(s): CKTOTAL, CKMB, CKMBINDEX, TROPONINI in the last 168 hours. BNP: Invalid input(s): POCBNP CBG: Recent Labs  Lab 03/10/20 1634 03/10/20 2127 03/11/20 0615 03/11/20 0915 03/11/20 1158  GLUCAP 124* 87 192* 225* 242*   D-Dimer No results for input(s): DDIMER in the last 72 hours. Hgb A1c Recent Labs    03/10/20 1930 03/11/20 0505  HGBA1C 6.9* 6.8*   Lipid Profile Recent Labs    03/11/20 0505  CHOL 105  HDL 29*  LDLCALC 45  TRIG 191*  CHOLHDL 3.6   Thyroid function studies No results for input(s): TSH, T4TOTAL, T3FREE, THYROIDAB in the last 72 hours.  Invalid input(s): FREET3 Anemia work up No results for input(s): VITAMINB12, FOLATE, FERRITIN, TIBC, IRON, RETICCTPCT in the last 72 hours. Urinalysis    Component Value Date/Time   BILIRUBINUR  negative 07/29/2018 0930   BILIRUBINUR negative 01/16/2017 1553   KETONESUR negative 07/29/2018 0930   PROTEINUR negative 07/29/2018 0930   PROTEINUR negative 01/16/2017 1553   UROBILINOGEN 0.2 07/29/2018 0930   NITRITE Negative 07/29/2018 0930   NITRITE  negative 01/16/2017 1553   LEUKOCYTESUR Negative 07/29/2018 0930   Sepsis Labs Invalid input(s): PROCALCITONIN,  WBC,  LACTICIDVEN Microbiology Recent Results (from the past 240 hour(s))  Respiratory Panel by RT PCR (Flu A&B, Covid) - Nasopharyngeal Swab     Status: None   Collection Time: 03/10/20  5:51 PM   Specimen: Nasopharyngeal Swab  Result Value Ref Range Status   SARS Coronavirus 2 by RT PCR NEGATIVE NEGATIVE Final    Comment: (NOTE) SARS-CoV-2 target nucleic acids are NOT DETECTED.  The SARS-CoV-2 RNA is generally detectable in upper respiratoy specimens during the acute phase of infection. The lowest concentration of SARS-CoV-2 viral copies this assay can detect is 131 copies/mL. A negative result does not preclude SARS-Cov-2 infection and should not be used as the sole basis for treatment or other patient management decisions. A negative result may occur with  improper specimen collection/handling, submission of specimen other than nasopharyngeal swab, presence of viral mutation(s) within the areas targeted by this assay, and inadequate number of viral copies (<131 copies/mL). A negative result must be combined with clinical observations, patient history, and epidemiological information. The expected result is Negative.  Fact Sheet for Patients:  https://www.moore.com/https://www.fda.gov/media/142436/download  Fact Sheet for Healthcare Providers:  https://www.young.biz/https://www.fda.gov/media/142435/download  This test is no t yet approved or cleared by the [place] FDA and  has been authorized for detection and/or diagnosis of SARS-CoV-2 by FDA under an Emergency Use Authorization (EUA). This EUA will remain  in effect (meaning this test can be used) for the duration of the COVID-19 declaration under Section 564(b)(1) of the Act, 21 U.S.C. section 360bbb-3(b)(1), unless the authorization is terminated or revoked sooner.     Influenza A by PCR NEGATIVE NEGATIVE Final   Influenza B by PCR NEGATIVE  NEGATIVE Final    Comment: (NOTE) The Xpert Xpress SARS-CoV-2/FLU/RSV assay is intended as an aid in  the diagnosis of influenza from Nasopharyngeal swab specimens and  should not be used as a sole basis for treatment. Nasal washings and  aspirates are unacceptable for Xpert Xpress SARS-CoV-2/FLU/RSV  testing.  Fact Sheet for Patients: https://www.moore.com/https://www.fda.gov/media/142436/download  Fact Sheet for Healthcare Providers: https://www.young.biz/https://www.fda.gov/media/142435/download  This test is not yet approved or cleared by the [place] FDA and  has been authorized for detection and/or diagnosis of SARS-CoV-2 by  FDA under an Emergency Use Authorization (EUA). This EUA will remain  in effect (meaning this test can be used) for the duration of the  Covid-19 declaration under Section 564(b)(1) of the Act, 21  U.S.C. section 360bbb-3(b)(1), unless the authorization is  terminated or revoked. Performed at [facility], [address] [place], [place] [address]    Time coordinating discharge: 35 minutes  SIGNED:  Merlene Laughtermair Latif Jalaila Caradonna, DO [facility] 03/11/2020, 6:08 PM Pager is on AMION  If 7PM-7AM, please contact night-coverage [email or web address]

## 2020-03-11 NOTE — Progress Notes (Signed)
SLP Cancellation Note  Patient Details Name: Jeff [name] MRN: [id number] DOB: [birth year]   Cancelled treatment:       Reason Eval/Treat Not Completed: SLP screened, no needs identified, will sign off.  Per PT, OT, and RN, pt is fully back to baseline with no additional therapy needs.     Danielly Ackerley, Melanee Spry 03/11/2020, 1:23 PM

## 2020-04-08 ENCOUNTER — Telehealth: Payer: Medicare PPO | Admitting: Family Medicine

## 2020-06-04 DIAGNOSIS — I639 Cerebral infarction, unspecified: Secondary | ICD-10-CM

## 2020-06-04 HISTORY — DX: Cerebral infarction, unspecified: I63.9

## 2020-08-17 DIAGNOSIS — H538 Other visual disturbances: Secondary | ICD-10-CM | POA: Diagnosis not present

## 2020-08-17 DIAGNOSIS — H43811 Vitreous degeneration, right eye: Secondary | ICD-10-CM | POA: Diagnosis not present

## 2020-08-17 DIAGNOSIS — Z961 Presence of intraocular lens: Secondary | ICD-10-CM | POA: Diagnosis not present

## 2020-08-17 DIAGNOSIS — E113393 Type 2 diabetes mellitus with moderate nonproliferative diabetic retinopathy without macular edema, bilateral: Secondary | ICD-10-CM | POA: Diagnosis not present

## 2020-08-17 DIAGNOSIS — I679 Cerebrovascular disease, unspecified: Secondary | ICD-10-CM | POA: Diagnosis not present

## 2020-08-17 DIAGNOSIS — H04123 Dry eye syndrome of bilateral lacrimal glands: Secondary | ICD-10-CM | POA: Diagnosis not present

## 2020-08-17 DIAGNOSIS — H53022 Refractive amblyopia, left eye: Secondary | ICD-10-CM | POA: Diagnosis not present

## 2020-09-26 DIAGNOSIS — E1122 Type 2 diabetes mellitus with diabetic chronic kidney disease: Secondary | ICD-10-CM | POA: Diagnosis not present

## 2020-09-26 DIAGNOSIS — I639 Cerebral infarction, unspecified: Secondary | ICD-10-CM | POA: Diagnosis not present

## 2020-09-26 DIAGNOSIS — N1831 Chronic kidney disease, stage 3a: Secondary | ICD-10-CM | POA: Diagnosis not present

## 2020-09-26 DIAGNOSIS — I129 Hypertensive chronic kidney disease with stage 1 through stage 4 chronic kidney disease, or unspecified chronic kidney disease: Secondary | ICD-10-CM | POA: Diagnosis not present

## 2020-12-10 ENCOUNTER — Emergency Department (HOSPITAL_BASED_OUTPATIENT_CLINIC_OR_DEPARTMENT_OTHER): Payer: No Typology Code available for payment source

## 2020-12-10 ENCOUNTER — Emergency Department (HOSPITAL_BASED_OUTPATIENT_CLINIC_OR_DEPARTMENT_OTHER)
Admission: EM | Admit: 2020-12-10 | Discharge: 2020-12-10 | Disposition: A | Discharge status: Home / Self Care | Payer: No Typology Code available for payment source | Attending: Emergency Medicine | Admitting: Emergency Medicine

## 2020-12-10 ENCOUNTER — Encounter (HOSPITAL_BASED_OUTPATIENT_CLINIC_OR_DEPARTMENT_OTHER): Payer: Self-pay | Admitting: *Deleted

## 2020-12-10 ENCOUNTER — Other Ambulatory Visit: Payer: Self-pay

## 2020-12-10 DIAGNOSIS — Z7984 Long term (current) use of oral hypoglycemic drugs: Secondary | ICD-10-CM | POA: Insufficient documentation

## 2020-12-10 DIAGNOSIS — Z7902 Long term (current) use of antithrombotics/antiplatelets: Secondary | ICD-10-CM | POA: Insufficient documentation

## 2020-12-10 DIAGNOSIS — Z79899 Other long term (current) drug therapy: Secondary | ICD-10-CM | POA: Insufficient documentation

## 2020-12-10 DIAGNOSIS — L03311 Cellulitis of abdominal wall: Secondary | ICD-10-CM | POA: Insufficient documentation

## 2020-12-10 DIAGNOSIS — N189 Chronic kidney disease, unspecified: Secondary | ICD-10-CM | POA: Diagnosis not present

## 2020-12-10 DIAGNOSIS — I129 Hypertensive chronic kidney disease with stage 1 through stage 4 chronic kidney disease, or unspecified chronic kidney disease: Secondary | ICD-10-CM | POA: Diagnosis not present

## 2020-12-10 DIAGNOSIS — Z20822 Contact with and (suspected) exposure to covid-19: Secondary | ICD-10-CM | POA: Diagnosis not present

## 2020-12-10 DIAGNOSIS — E1169 Type 2 diabetes mellitus with other specified complication: Secondary | ICD-10-CM | POA: Diagnosis not present

## 2020-12-10 DIAGNOSIS — E785 Hyperlipidemia, unspecified: Secondary | ICD-10-CM | POA: Insufficient documentation

## 2020-12-10 DIAGNOSIS — E1122 Type 2 diabetes mellitus with diabetic chronic kidney disease: Secondary | ICD-10-CM | POA: Diagnosis not present

## 2020-12-10 DIAGNOSIS — L0390 Cellulitis, unspecified: Secondary | ICD-10-CM

## 2020-12-10 DIAGNOSIS — Z794 Long term (current) use of insulin: Secondary | ICD-10-CM | POA: Insufficient documentation

## 2020-12-10 DIAGNOSIS — R109 Unspecified abdominal pain: Secondary | ICD-10-CM | POA: Diagnosis present

## 2020-12-10 HISTORY — DX: Disorder of kidney and ureter, unspecified: N28.9

## 2020-12-10 LAB — COMPREHENSIVE METABOLIC PANEL
ALT: 14 U/L (ref 0–44)
AST: 12 U/L — ABNORMAL LOW (ref 15–41)
Albumin: 4.2 g/dL (ref 3.5–5.0)
Alkaline Phosphatase: 56 U/L (ref 38–126)
Anion gap: 11 (ref 5–15)
BUN: 20 mg/dL (ref 8–23)
CO2: 23 mmol/L (ref 22–32)
Calcium: 9.2 mg/dL (ref 8.9–10.3)
Chloride: 104 mmol/L (ref 98–111)
Creatinine, Ser: 1.2 mg/dL (ref 0.61–1.24)
GFR, Estimated: >60 mL/min (ref >60)
Glucose, Bld: 73 mg/dL (ref 70–99)
Potassium: 3.7 mmol/L (ref 3.5–5.1)
Sodium: 138 mmol/L (ref 135–145)
Total Bilirubin: 0.5 mg/dL (ref 0.3–1.2)
Total Protein: 6.7 g/dL (ref 6.5–8.1)

## 2020-12-10 LAB — CBC WITH DIFFERENTIAL/PLATELET
Abs Immature Granulocytes: 0.03 10*3/uL (ref 0.00–0.07)
Basophils Absolute: 0 10*3/uL (ref 0.0–0.1)
Basophils Relative: 0 %
Eosinophils Absolute: 0.3 10*3/uL (ref 0.0–0.5)
Eosinophils Relative: 3 %
HCT: 40.8 % (ref 39.0–52.0)
Hemoglobin: 13.6 g/dL (ref 13.0–17.0)
Immature Granulocytes: 0 %
Lymphocytes Relative: 12 %
Lymphs Abs: 1.4 10*3/uL (ref 0.7–4.0)
MCH: 27.8 pg (ref 26.0–34.0)
MCHC: 33.3 g/dL (ref 30.0–36.0)
MCV: 83.3 fL (ref 80.0–100.0)
Monocytes Absolute: 1.2 10*3/uL — ABNORMAL HIGH (ref 0.1–1.0)
Monocytes Relative: 10 %
Neutro Abs: 8.4 10*3/uL — ABNORMAL HIGH (ref 1.7–7.7)
Neutrophils Relative %: 75 %
Platelets: 235 10*3/uL (ref 150–400)
RBC: 4.9 MIL/uL (ref 4.22–5.81)
RDW: 13.5 % (ref 11.5–15.5)
WBC: 11.3 10*3/uL — ABNORMAL HIGH (ref 4.0–10.5)
nRBC: 0 % (ref 0.0–0.2)

## 2020-12-10 LAB — RESP PANEL BY RT-PCR (FLU A&B, COVID) ARPGX2
Influenza A by PCR: NEGATIVE
Influenza B by PCR: NEGATIVE
SARS Coronavirus 2 by RT PCR: NEGATIVE

## 2020-12-10 LAB — CBG MONITORING, ED: Glucose-Capillary: 84 mg/dL (ref 70–99)

## 2020-12-10 LAB — LACTIC ACID, PLASMA: Lactic Acid, Venous: 1.1 mmol/L (ref 0.5–1.9)

## 2020-12-10 LAB — LIPASE, BLOOD: Lipase: 48 U/L (ref 11–51)

## 2020-12-10 MED ORDER — IOHEXOL 300 MG/ML  SOLN
75.0000 mL | Freq: Once | INTRAMUSCULAR | Status: AC | PRN
  Administered 2020-12-10: 75 mL via INTRAVENOUS

## 2020-12-10 MED ORDER — CLINDAMYCIN HCL 300 MG PO CAPS: 300.0000 mg | ORAL_CAPSULE | Freq: Three times a day (TID) | ORAL | 0 refills | Status: AC

## 2020-12-10 MED ORDER — CLINDAMYCIN PHOSPHATE 600 MG/50ML IV SOLN
600.0000 mg | Freq: Once | INTRAVENOUS | Status: AC
  Administered 2020-12-10: 600 mg via INTRAVENOUS
  Filled 2020-12-10: qty 50

## 2020-12-10 NOTE — ED Triage Notes (Addendum)
July 3rd Insulin Injection to left abd, now red and slightly swollen. States the area is becoming larger. Slight fever last night. Pt states his blood sugar is 89, normally 180-200. Pt shaky when standing.

## 2020-12-10 NOTE — ED Provider Notes (Signed)
[facility] EMERGENCY DEPT Provider Note   CSN: [id number] Arrival date & time: 12/10/20  1527     History Chief Complaint  Patient presents with   Abdominal Pain    Jeff [name] is a 70 y.o. male.  The history is provided by the patient.  Abdominal Pain Pain location:  LUQ Pain quality: aching   Pain severity:  Mild Onset quality:  Gradual Duration:  6 days Timing:  Constant Progression:  Worsening Chronicity:  New Context comment:  Pain, redness, swelling at insulin injection site in the left side of his abdomen has been worse over the last 6 days.  No fever. 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       Family History  Problem Relation Age of Onset   Heart disease Mother        CHF   Diabetes Mother    Heart disease Father    Cancer Brother     Social History   Tobacco Use   Smoking status: Never   Smokeless tobacco: Never  Vaping Use   Vaping Use: Never used   Substance Use Topics   Alcohol use: No    Alcohol/week: 0.0 standard drinks   Drug use: No    Home Medications Prior to Admission medications   Medication Sig Start Date End Date Taking? Authorizing Provider  clindamycin (CLEOCIN) 300 MG capsule Take 1 capsule (300 mg total) by mouth 3 (three) times daily for 10 days. 12/10/20 12/20/20 Yes Travia Onstad, DO  acetaminophen (TYLENOL) 325 MG tablet Take 1-2 tablets (325-650 mg total) by mouth every 4 (four) hours as needed for mild pain. 08/27/18   Jacquelynn Cree, PA-C  clopidogrel (PLAVIX) 75 MG tablet TAKE 1 TABLET BY MOUTH DAILY 08/28/18   Lezlie Lye, Meda Coffee, MD  glucose blood [facility] VER[facility]) test strip 1 each by Other route 4 (four) times daily -  before meals and at bedtime. Patient taking differently: 1 each by Other route 4 (four) times daily -  before meals and at bedtime. dexcom 6 08/27/18   Hutto, Evlyn Kanner, PA-C  insulin glargine (LANTUS) 100 UNIT/ML injection Inject 0.25 mLs (25 Units total) into the skin 2 (two) times daily. 03/11/20   Sheikh, Kateri Mc Latif, DO  Insulin Syringe-Needle U-100 (B-D INS SYR ULTRAFINE 1CC/31G) 31G X 5/16" 1 ML MISC Used to inject insulin twice daily. 08/16/17  Romero BellingEllison, Sean, MD  levothyroxine (SYNTHROID) 50 MCG tablet Take 50 mcg by mouth daily before breakfast.    [provider]  lisinopril (ZESTRIL) 2.5 MG tablet Take 2.5 mg by mouth daily.    [provider]  metFORMIN (GLUCOPHAGE-XR) 500 MG 24 hr tablet Take 500 mg by mouth in the morning and at bedtime.     [provider]  pantoprazole (PROTONIX) 40 MG tablet TAKE 1 TABLET BY MOUTH AT BEDTIME 08/28/18   Lezlie LyeSantiago Lago, Meda CoffeeIrma M, MD  rosuvastatin (CRESTOR) 40 MG tablet TAKE 1 TABLET BY MOUTH DAILY 08/28/18   Lezlie LyeSantiago Lago, Meda CoffeeIrma M, MD  senna-docusate (SENOKOT-S) 8.6-50 MG tablet Take 1 tablet by mouth at bedtime as needed for mild constipation. 03/11/20   Marguerita MerlesSheikh, Omair Latif, DO    Allergies    Flexeril [cyclobenzaprine hcl],  Lidocaine, and Novocain [procaine hcl]  Review of Systems   Review of Systems  Constitutional:  Negative for chills and fever.  HENT:  Negative for ear pain and sore throat.   Eyes:  Negative for pain and visual disturbance.  Respiratory:  Negative for cough and shortness of breath.   Cardiovascular:  Negative for chest pain and palpitations.  Gastrointestinal:  Positive for abdominal pain. Negative for vomiting.  Genitourinary:  Negative for dysuria and hematuria.  Musculoskeletal:  Negative for arthralgias and back pain.  Skin:  Positive for color change. Negative for rash.  Neurological:  Negative for seizures and syncope.  All other systems reviewed and are negative.  Physical Exam Updated Vital Signs BP 139/75 (BP Location: Right Arm)   Pulse 83   Temp 99.1 F (37.3 C) (Oral)   Resp 18   Ht 5\' 8"  (1.727 m)   Wt 94.8 kg   SpO2 98%   BMI 31.78 kg/m   Physical Exam Vitals and nursing note reviewed.  Constitutional:      General: He is not in acute distress.    Appearance: He is well-developed.  HENT:     Head: Normocephalic and atraumatic.     Mouth/Throat:     Mouth: Mucous membranes are moist.  Eyes:     Extraocular Movements: Extraocular movements intact.     Conjunctiva/sclera: Conjunctivae normal.     Pupils: Pupils are equal, round, and reactive to light.  Cardiovascular:     Rate and Rhythm: Normal rate and regular rhythm.     Heart sounds: Normal heart sounds. No murmur heard. Pulmonary:     Effort: Pulmonary effort is normal. No respiratory distress.     Breath sounds: Normal breath sounds.  Abdominal:     Palpations: Abdomen is soft.     Tenderness: There is abdominal tenderness.     Comments: Redness and fluctuance to the anterior abdominal wall on the left side of the abdomen with surrounding erythema but no purulent drainage  Musculoskeletal:     Cervical back: Neck supple.  Skin:    General: Skin is warm and dry.     Capillary Refill: Capillary  refill takes less than 2 seconds.  Neurological:     General: No focal deficit present.     Mental Status: He is alert.    ED Results / Procedures / Treatments   Labs (all labs ordered are listed, but only abnormal results are displayed) Labs Reviewed  CBC WITH DIFFERENTIAL/PLATELET - Abnormal; Notable for the following components:      Result Value   WBC 11.3 (*)    Neutro Abs 8.4 (*)  Monocytes Absolute 1.2 (*)    All other components within normal limits  COMPREHENSIVE METABOLIC PANEL - Abnormal; Notable for the following components:   AST 12 (*)    All other components within normal limits  RESP PANEL BY RT-PCR (FLU A&B, COVID) ARPGX2  CULTURE, BLOOD (SINGLE)  LACTIC ACID, PLASMA  LIPASE, BLOOD  CBG MONITORING, ED    EKG EKG Interpretation  Date/Time:  Saturday December 10 2020 15:35:58 EDT Ventricular Rate:  85 PR Interval:  150 QRS Duration: 91 QT Interval:  360 QTC Calculation: 428 R Axis:   41 Text Interpretation: Sinus rhythm Borderline T wave abnormalities Baseline wander in lead(s) V2 Partial missing lead(s): V2 Confirmed by Virgina Norfolk ([id number]) on 12/10/2020 3:45:57 PM  Radiology CT ABDOMEN PELVIS W CONTRAST  Result Date: 12/10/2020 CLINICAL DATA:  Left abdominal wall swelling and redness after injecting insulin 6 days ago. EXAM: CT ABDOMEN AND PELVIS WITH CONTRAST TECHNIQUE: Multidetector CT imaging of the abdomen and pelvis was performed using the standard protocol following bolus administration of intravenous contrast. CONTRAST:  37mL OMNIPAQUE IOHEXOL 300 MG/ML  SOLN COMPARISON:  09/04/2017 FINDINGS: Lower chest: Clear lung bases. Normal heart size without pericardial or pleural effusion. Tiny hiatal hernia. Hepatobiliary: Normal liver. Normal gallbladder, without biliary ductal dilatation. Pancreas: Normal, without mass or ductal dilatation. Spleen: Normal in size, without focal abnormality. Adrenals/Urinary Tract: Normal adrenal glands. Right renal cortical  thinning. Normal left kidney. No hydronephrosis. Normal urinary bladder. Stomach/Bowel: Normal remainder of the stomach. Normal colon, appendix, and terminal ileum. Normal small bowel. Vascular/Lymphatic: Aortic atherosclerosis. No abdominopelvic adenopathy. Reproductive: Normal prostate. Other: No significant free fluid. Skin thickening and subcutaneous edema about the left anterior abdominal wall, including on 40/2. No drainable fluid collection or subcutaneous gas. No extension of inflammation into the intra-abdominal/intraperitoneal space. Musculoskeletal: No acute osseous abnormality. IMPRESSION: 1. Skin thickening and subcutaneous edema about the left anterior abdominal wall, consistent with cellulitis. No drainable fluid collection or subcutaneous gas. 2. No acute process in the abdomen or pelvis. 3. Tiny hiatal hernia. 4.  Aortic Atherosclerosis (ICD10-I70.0). Electronically Signed   By: Jeronimo Greaves M.D.   On: 12/10/2020 18:30    Procedures Procedures   Medications Ordered in ED Medications  clindamycin (CLEOCIN) IVPB 600 mg (0 mg Intravenous Stopped 12/10/20 1917)  iohexol (OMNIPAQUE) 300 MG/ML solution 75 mL (75 mLs Intravenous Contrast Given 12/10/20 1721)    ED Course  I have reviewed the triage vital signs and the nursing notes.  Pertinent labs & imaging results that were available during my care of the patient were reviewed by me and considered in my medical decision making (see chart for details).    MDM Rules/Calculators/A&P                          AMEAR STROJNY is a 70 year old male who presents to the ED with abdominal pain, concern for infection.  Patient with unremarkable vitals.  No fever.  Has pain and swelling and redness to insulin injection site in his left upper abdomen.  Has gotten more red and painful over the last 5 to 6 days.  Becoming larger and redder.  No purulent drainage.  Denies any fevers.  Appears to likely have a cellulitis/abscess of his abdominal wall within  the subcutaneous tissues I suspect.  He does not appear to have vital signs consistent with sepsis but will get 1 blood culture and some basic labs including lactic acid.  We will give  him a dose of IV clindamycin and get a CT scan to evaluate the extent of infectious process.  CT scan shows skin thickening and subcutaneous edema that is consistent with a cellulitis.  There is no obvious abscess or subcutaneous gas.  No major leukocytosis or lactic acidosis.  Overall appears to be a localized infection at this time we will trial a dose of oral antibiotics.  He understands return precautions and understands wound recheck needed in 72 hours.  Discharged in good condition.  This chart was dictated using voice recognition software.  Despite best efforts to proofread,  errors can occur which can change the documentation meaning.   Final Clinical Impression(s) / ED Diagnoses Final diagnoses:  Cellulitis, unspecified cellulitis site    Rx / DC Orders ED Discharge Orders          Ordered    clindamycin (CLEOCIN) 300 MG capsule  3 times daily        12/10/20 1922             [place], DO 12/10/20 1924

## 2020-12-10 NOTE — ED Notes (Signed)
Pt feels shaky , states his blood sugar is usually in the 150's. 1 coke and 6 peanut butter crackers given.

## 2020-12-16 LAB — CULTURE, BLOOD (SINGLE)
Culture: NO GROWTH
Special Requests: ADEQUATE

## 2020-12-27 ENCOUNTER — Encounter (INDEPENDENT_AMBULATORY_CARE_PROVIDER_SITE_OTHER): Payer: Medicare PPO | Admitting: Ophthalmology

## 2020-12-27 ENCOUNTER — Other Ambulatory Visit: Payer: Self-pay

## 2020-12-27 DIAGNOSIS — I10 Essential (primary) hypertension: Secondary | ICD-10-CM | POA: Diagnosis not present

## 2020-12-27 DIAGNOSIS — H43813 Vitreous degeneration, bilateral: Secondary | ICD-10-CM | POA: Diagnosis not present

## 2020-12-27 DIAGNOSIS — H35033 Hypertensive retinopathy, bilateral: Secondary | ICD-10-CM | POA: Diagnosis not present

## 2020-12-27 DIAGNOSIS — E113313 Type 2 diabetes mellitus with moderate nonproliferative diabetic retinopathy with macular edema, bilateral: Secondary | ICD-10-CM

## 2021-01-01 ENCOUNTER — Observation Stay (HOSPITAL_COMMUNITY): Payer: No Typology Code available for payment source

## 2021-01-01 ENCOUNTER — Observation Stay (HOSPITAL_COMMUNITY)
Admission: EM | Admit: 2021-01-01 | Discharge: 2021-01-03 | Disposition: A | Discharge status: Home / Self Care | Payer: No Typology Code available for payment source | Attending: Internal Medicine | Admitting: Internal Medicine

## 2021-01-01 ENCOUNTER — Emergency Department (HOSPITAL_COMMUNITY): Payer: No Typology Code available for payment source

## 2021-01-01 ENCOUNTER — Encounter (HOSPITAL_COMMUNITY): Payer: Self-pay

## 2021-01-01 DIAGNOSIS — R531 Weakness: Secondary | ICD-10-CM

## 2021-01-01 DIAGNOSIS — Z20822 Contact with and (suspected) exposure to covid-19: Secondary | ICD-10-CM | POA: Diagnosis not present

## 2021-01-01 DIAGNOSIS — Z7902 Long term (current) use of antithrombotics/antiplatelets: Secondary | ICD-10-CM | POA: Diagnosis not present

## 2021-01-01 DIAGNOSIS — Z888 Allergy status to other drugs, medicaments and biological substances status: Secondary | ICD-10-CM | POA: Insufficient documentation

## 2021-01-01 DIAGNOSIS — Z8249 Family history of ischemic heart disease and other diseases of the circulatory system: Secondary | ICD-10-CM | POA: Diagnosis not present

## 2021-01-01 DIAGNOSIS — Z884 Allergy status to anesthetic agent status: Secondary | ICD-10-CM | POA: Insufficient documentation

## 2021-01-01 DIAGNOSIS — Z833 Family history of diabetes mellitus: Secondary | ICD-10-CM | POA: Diagnosis not present

## 2021-01-01 DIAGNOSIS — Z7901 Long term (current) use of anticoagulants: Secondary | ICD-10-CM | POA: Diagnosis not present

## 2021-01-01 DIAGNOSIS — Z7984 Long term (current) use of oral hypoglycemic drugs: Secondary | ICD-10-CM | POA: Insufficient documentation

## 2021-01-01 DIAGNOSIS — E785 Hyperlipidemia, unspecified: Secondary | ICD-10-CM | POA: Diagnosis not present

## 2021-01-01 DIAGNOSIS — Z79890 Hormone replacement therapy (postmenopausal): Secondary | ICD-10-CM | POA: Diagnosis not present

## 2021-01-01 DIAGNOSIS — G459 Transient cerebral ischemic attack, unspecified: Secondary | ICD-10-CM | POA: Diagnosis not present

## 2021-01-01 DIAGNOSIS — E119 Type 2 diabetes mellitus without complications: Secondary | ICD-10-CM

## 2021-01-01 DIAGNOSIS — Z79899 Other long term (current) drug therapy: Secondary | ICD-10-CM | POA: Diagnosis not present

## 2021-01-01 DIAGNOSIS — Z794 Long term (current) use of insulin: Secondary | ICD-10-CM | POA: Insufficient documentation

## 2021-01-01 DIAGNOSIS — N183 Chronic kidney disease, stage 3 unspecified: Secondary | ICD-10-CM | POA: Insufficient documentation

## 2021-01-01 DIAGNOSIS — I6621 Occlusion and stenosis of right posterior cerebral artery: Secondary | ICD-10-CM | POA: Diagnosis not present

## 2021-01-01 DIAGNOSIS — I129 Hypertensive chronic kidney disease with stage 1 through stage 4 chronic kidney disease, or unspecified chronic kidney disease: Secondary | ICD-10-CM | POA: Insufficient documentation

## 2021-01-01 DIAGNOSIS — E1122 Type 2 diabetes mellitus with diabetic chronic kidney disease: Secondary | ICD-10-CM | POA: Diagnosis not present

## 2021-01-01 LAB — URINALYSIS, ROUTINE W REFLEX MICROSCOPIC
Bilirubin Urine: NEGATIVE
Glucose, UA: 50 mg/dL — AB
Hgb urine dipstick: NEGATIVE
Ketones, ur: NEGATIVE mg/dL
Leukocytes,Ua: NEGATIVE
Nitrite: NEGATIVE
Protein, ur: 30 mg/dL — AB
Specific Gravity, Urine: 1.018 (ref 1.005–1.030)
pH: 6 (ref 5.0–8.0)

## 2021-01-01 LAB — RESP PANEL BY RT-PCR (FLU A&B, COVID) ARPGX2
Influenza A by PCR: NEGATIVE
Influenza B by PCR: NEGATIVE
SARS Coronavirus 2 by RT PCR: NEGATIVE

## 2021-01-01 LAB — COMPREHENSIVE METABOLIC PANEL
ALT: 20 U/L (ref 0–44)
AST: 17 U/L (ref 15–41)
Albumin: 3.6 g/dL (ref 3.5–5.0)
Alkaline Phosphatase: 50 U/L (ref 38–126)
Anion gap: 10 (ref 5–15)
BUN: 17 mg/dL (ref 8–23)
CO2: 21 mmol/L — ABNORMAL LOW (ref 22–32)
Calcium: 9 mg/dL (ref 8.9–10.3)
Chloride: 103 mmol/L (ref 98–111)
Creatinine, Ser: 1.56 mg/dL — ABNORMAL HIGH (ref 0.61–1.24)
GFR, Estimated: 47 mL/min — ABNORMAL LOW (ref >60)
Glucose, Bld: 177 mg/dL — ABNORMAL HIGH (ref 70–99)
Potassium: 4 mmol/L (ref 3.5–5.1)
Sodium: 134 mmol/L — ABNORMAL LOW (ref 135–145)
Total Bilirubin: 0.8 mg/dL (ref 0.3–1.2)
Total Protein: 6.8 g/dL (ref 6.5–8.1)

## 2021-01-01 LAB — CBC
HCT: 41.1 % (ref 39.0–52.0)
Hemoglobin: 14.2 g/dL (ref 13.0–17.0)
MCH: 28.8 pg (ref 26.0–34.0)
MCHC: 34.5 g/dL (ref 30.0–36.0)
MCV: 83.4 fL (ref 80.0–100.0)
Platelets: 213 10*3/uL (ref 150–400)
RBC: 4.93 MIL/uL (ref 4.22–5.81)
RDW: 13.4 % (ref 11.5–15.5)
WBC: 11.8 10*3/uL — ABNORMAL HIGH (ref 4.0–10.5)
nRBC: 0 % (ref 0.0–0.2)

## 2021-01-01 LAB — CBG MONITORING, ED: Glucose-Capillary: 180 mg/dL — ABNORMAL HIGH (ref 70–99)

## 2021-01-01 LAB — GLUCOSE, CAPILLARY: Glucose-Capillary: 270 mg/dL — ABNORMAL HIGH (ref 70–99)

## 2021-01-01 MED ORDER — ASPIRIN 325 MG PO TABS
325.0000 mg | ORAL_TABLET | Freq: Every day | ORAL | Status: DC
  Administered 2021-01-02: 325 mg via ORAL
  Filled 2021-01-01 (×2): qty 1

## 2021-01-01 MED ORDER — SENNOSIDES-DOCUSATE SODIUM 8.6-50 MG PO TABS: 1.0000 | ORAL_TABLET | Freq: Every evening | ORAL | Status: DC | PRN

## 2021-01-01 MED ORDER — INSULIN GLARGINE-YFGN 100 UNIT/ML ~~LOC~~ SOLN
30.0000 [IU] | Freq: Two times a day (BID) | SUBCUTANEOUS | Status: DC
  Administered 2021-01-01 – 2021-01-03 (×4): 30 [IU] via SUBCUTANEOUS
  Filled 2021-01-01 (×6): qty 0.3

## 2021-01-01 MED ORDER — ACETAMINOPHEN 650 MG RE SUPP: 650.0000 mg | RECTAL | Status: DC | PRN

## 2021-01-01 MED ORDER — PANTOPRAZOLE SODIUM 40 MG PO TBEC
40.0000 mg | DELAYED_RELEASE_TABLET | Freq: Every day | ORAL | Status: DC
  Administered 2021-01-01 – 2021-01-02 (×2): 40 mg via ORAL
  Filled 2021-01-01 (×2): qty 1

## 2021-01-01 MED ORDER — ENOXAPARIN SODIUM 40 MG/0.4ML IJ SOSY
40.0000 mg | PREFILLED_SYRINGE | INTRAMUSCULAR | Status: DC
  Administered 2021-01-01 – 2021-01-02 (×2): 40 mg via SUBCUTANEOUS
  Filled 2021-01-01 (×2): qty 0.4

## 2021-01-01 MED ORDER — ACETAMINOPHEN 325 MG PO TABS: 650.0000 mg | ORAL_TABLET | ORAL | Status: DC | PRN

## 2021-01-01 MED ORDER — ACETAMINOPHEN 160 MG/5ML PO SOLN: 650.0000 mg | ORAL | Status: DC | PRN

## 2021-01-01 MED ORDER — SODIUM CHLORIDE 0.9 % IV SOLN: INTRAVENOUS | Status: DC

## 2021-01-01 MED ORDER — CLOPIDOGREL BISULFATE 75 MG PO TABS
75.0000 mg | ORAL_TABLET | Freq: Every day | ORAL | Status: DC
  Administered 2021-01-01 – 2021-01-02 (×2): 75 mg via ORAL
  Filled 2021-01-01 (×2): qty 1

## 2021-01-01 MED ORDER — ASPIRIN 81 MG PO CHEW
324.0000 mg | CHEWABLE_TABLET | Freq: Once | ORAL | Status: AC
  Administered 2021-01-01: 324 mg via ORAL
  Filled 2021-01-01: qty 4

## 2021-01-01 MED ORDER — ACETAMINOPHEN 325 MG PO TABS: 650.0000 mg | ORAL_TABLET | Freq: Four times a day (QID) | ORAL | Status: DC | PRN

## 2021-01-01 MED ORDER — INSULIN ASPART 100 UNIT/ML IJ SOLN
0.0000 [IU] | Freq: Three times a day (TID) | INTRAMUSCULAR | Status: DC
  Administered 2021-01-02: 3 [IU] via SUBCUTANEOUS
  Administered 2021-01-02: 8 [IU] via SUBCUTANEOUS

## 2021-01-01 MED ORDER — LEVOTHYROXINE SODIUM 50 MCG PO TABS
50.0000 ug | ORAL_TABLET | Freq: Every day | ORAL | Status: DC
  Administered 2021-01-02: 50 ug via ORAL
  Filled 2021-01-01 (×2): qty 1

## 2021-01-01 MED ORDER — ROSUVASTATIN CALCIUM 20 MG PO TABS
40.0000 mg | ORAL_TABLET | Freq: Every day | ORAL | Status: DC
  Administered 2021-01-01 – 2021-01-03 (×3): 40 mg via ORAL
  Filled 2021-01-01 (×4): qty 2

## 2021-01-01 MED ORDER — STROKE: EARLY STAGES OF RECOVERY BOOK
Freq: Once | Status: DC
  Filled 2021-01-01: qty 1

## 2021-01-01 NOTE — ED Provider Notes (Signed)
Care of the patient assumed at the change of shift. Here for R leg weakness, has history of prior stroke on Plavix. Was having aphasia per EMS. LNW was last night so not a candidate for tPA. Patient reports symptoms have resolved. Plan for admission for TIA.  Physical Exam  BP 107/82   Pulse 80   Temp 99 F (37.2 C) (Oral)   Resp 19   SpO2 90%   Physical Exam Normal speech No facial droop Normal strength R leg ED Course/Procedures   Clinical Course as of 01/01/21 1803  Sun Jan 01, 2021  1656 Spoke with Dr. Barbara Cower, Hospitalist, who will evaluate for admission.  [CS]    Clinical Course User Index [CS] Pollyann Savoy, MD    Procedures  MDM         Pollyann Savoy, MD 01/01/21 1705

## 2021-01-01 NOTE — ED Notes (Signed)
Patient transported to CT 

## 2021-01-01 NOTE — Consult Note (Addendum)
Neurology Consultation  Reason for Consult: aphasia, worsened right sided weakness Referring Physician: Dr. Bernette Mayers, ED  CC: Aphasia, slurred speech, right-sided weakness  History is obtained from: Patient, chart  HPI: Jeff [name] is a 70 y.o. male [place] of the [employer] history of diabetes, hypertension, hyperlipidemia, prior stroke with some residual right-sided weakness with some gait difficulty modified Rankin of 2, presented for evaluation of word finding difficulty, slurred speech and fall due to lower extremity weakness right worse than left. Last known well was sometime last night when he went to bed, but on waking up which he does not remember the exact time, there was diminished strength in his bilateral lower extremities and he had a fall while trying to get up.  His speech also was not making sense according to his wife.  His speech also sounded slurred. His symptoms are rapidly improving.  He did have some diminished sensation on the right side initially which also improved. He was admitted for a TIA/stroke work-up with exam nearly normal at the time of admission. Neurological consultation for TIA/stroke  Currently on Plavix at home. Mostly sedentary lifestyle due to gait difficulty.  Was getting some outpatient therapy that stopped in December.  He has been evaluated in October of this year for concern for staring spell-seizure versus stroke-symptoms attributed to hypoglycemic episode causing strokelike symptoms. CTA head and neck at that time with no emergent LVO and chronic right greater than left ICA siphon stenosis, left V4 occlusion and a new right P1 moderate stenosis otherwise stable intracranial greater than extracranial atherosclerosis.  Prior stroke March 2020 punctate left medullary infarct status post tPA.  Remains on statin   LKW: 2100 hrs. on 12/31/2020 tpa given?: no, the window Premorbid modified Rankin scale (mRS): 2  ROS: Full ROS was performed and is  negative except as noted in the HPI.  Unable to obtain due to altered mental status.   Past Medical History:  Diagnosis Date   ADHD (attention deficit hyperactivity disorder)    Diabetes mellitus    Hyperlipidemia    Hypertension    Renal disorder    Stroke [facility])         Family History  Problem Relation Age of Onset   Heart disease Mother        CHF   Diabetes Mother    Heart disease Father    Cancer Brother      Social History:   reports that he has never smoked. He has never used smokeless tobacco. He reports that he does not drink alcohol and does not use drugs.  Medications  Current Facility-Administered Medications:     stroke: mapping our early stages of recovery book, , Does not apply, Once, Arsenio Loader, MD   0.9 %  sodium chloride infusion, , Intravenous, Continuous, Barbara Cower, Elayne Snare, MD   acetaminophen (TYLENOL) tablet 650 mg, 650 mg, Oral, Q4H PRN **OR** acetaminophen (TYLENOL) 160 MG/5ML solution 650 mg, 650 mg, Per Tube, Q4H PRN **OR** acetaminophen (TYLENOL) suppository 650 mg, 650 mg, Rectal, Q4H PRN, Arsenio Loader, MD   [START ON 01/02/2021] aspirin tablet 325 mg, 325 mg, Oral, Daily, Arsenio Loader, MD   clopidogrel (PLAVIX) tablet 75 mg, 75 mg, Oral, Daily, Arsenio Loader, MD   enoxaparin (LOVENOX) injection 40 mg, 40 mg, Subcutaneous, Q24H, Arsenio Loader, MD   [START ON 01/02/2021] insulin aspart (novoLOG) injection 0-15 Units, 0-15 Units, Subcutaneous, TID WC, Barbara Cower, Elayne Snare, MD   insulin glargine-yfgn (  SEMGLEE) injection 30 Units, 30 Units, Subcutaneous, BID, Arsenio Loader, MD   [START ON 01/02/2021] levothyroxine (SYNTHROID) tablet 50 mcg, 50 mcg, Oral, Q0600, Arsenio Loader, MD   pantoprazole (PROTONIX) EC tablet 40 mg, 40 mg, Oral, QHS, Barbara Cower, Elayne Snare, MD   rosuvastatin (CRESTOR) tablet 40 mg, 40 mg, Oral, Daily, Barbara Cower, Elayne Snare, MD    senna-docusate (Senokot-S) tablet 1 tablet, 1 tablet, Oral, QHS PRN, Arsenio Loader, MD  Current Outpatient Medications:    clopidogrel (PLAVIX) 75 MG tablet, TAKE 1 TABLET BY MOUTH DAILY, Disp: 90 tablet, Rfl: 0   insulin glargine (LANTUS) 100 UNIT/ML injection, Inject 0.25 mLs (25 Units total) into the skin 2 (two) times daily., Disp: 10 mL, Rfl: 11   levothyroxine (SYNTHROID) 50 MCG tablet, Take 50 mcg by mouth daily before breakfast., Disp: , Rfl:    lisinopril (ZESTRIL) 2.5 MG tablet, Take 2.5 mg by mouth daily., Disp: , Rfl:    metFORMIN (GLUCOPHAGE-XR) 500 MG 24 hr tablet, Take 500 mg by mouth in the morning and at bedtime. , Disp: , Rfl:    pantoprazole (PROTONIX) 40 MG tablet, TAKE 1 TABLET BY MOUTH AT BEDTIME, Disp: 90 tablet, Rfl: 0   rosuvastatin (CRESTOR) 40 MG tablet, TAKE 1 TABLET BY MOUTH DAILY, Disp: 90 tablet, Rfl: 0   acetaminophen (TYLENOL) 325 MG tablet, Take 1-2 tablets (325-650 mg total) by mouth every 4 (four) hours as needed for mild pain., Disp: , Rfl:    glucose blood (ONETOUCH VERIO) test strip, 1 each by Other route 4 (four) times daily -  before meals and at bedtime. (Patient taking differently: 1 each by Other route 4 (four) times daily -  before meals and at bedtime. dexcom 6), Disp: 100 each, Rfl: 12   Insulin Syringe-Needle U-100 (B-D INS SYR ULTRAFINE 1CC/31G) 31G X 5/16" 1 ML MISC, Used to inject insulin twice daily., Disp: 90 each, Rfl: 2   senna-docusate (SENOKOT-S) 8.6-50 MG tablet, Take 1 tablet by mouth at bedtime as needed for mild constipation., Disp: 30 tablet, Rfl: 0   Exam: Current vital signs: BP 122/81   Pulse 75   Temp 99 F (37.2 C) (Oral)   Resp 14   SpO2 100%  Vital signs in last 24 hours: Temp:  [99 F (37.2 C)] 99 F (37.2 C) (07/31 1530) Pulse Rate:  [75-85] 75 (07/31 1715) Resp:  [14-19] 14 (07/31 1715) BP: (107-145)/(71-82) 122/81 (07/31 1715) SpO2:  [90 %-100 %] 100 % (07/31 1715) General: Very pleasant gentleman who  is awake alert in no distress HEENT: Normocephalic/atraumatic Lungs clear to auscultation Cardiovascular: Regular rate rhythm Abdomen nondistended nontender Extremities warm well perfused Neurological exam Awake alert oriented x3 No dysarthria No aphasia-able to provide history reliably and have a full conversation with no word finding difficulty. Cranial nerves II to XII intact Motor exam with no vertical drift in any of the 4 extremities Sensation intact to light touch Coordination intact with no dysmetria in upper or lower extremities. Gait testing deferred at this time NIH scale-0  Labs I have reviewed labs in epic and the results pertinent to this consultation are:  CBC    Component Value Date/Time   WBC 11.8 (H) 01/01/2021 1454   RBC 4.93 01/01/2021 1454   HGB 14.2 01/01/2021 1454   HGB 15.9 07/29/2018 0947   HCT 41.1 01/01/2021 1454   HCT 46.4 07/29/2018 0947   PLT 213 01/01/2021 1454   PLT 294 07/29/2018 0947   MCV 83.4 01/01/2021  1454   MCV 84 07/29/2018 0947   MCH 28.8 01/01/2021 1454   MCHC 34.5 01/01/2021 1454   RDW 13.4 01/01/2021 1454   RDW 13.2 07/29/2018 0947   LYMPHSABS 1.4 12/10/2020 1547   LYMPHSABS 1.9 07/29/2018 0947   MONOABS 1.2 (H) 12/10/2020 1547   EOSABS 0.3 12/10/2020 1547   EOSABS 0.5 (H) 07/29/2018 0947   BASOSABS 0.0 12/10/2020 1547   BASOSABS 0.1 07/29/2018 0947    CMP     Component Value Date/Time   NA 134 (L) 01/01/2021 1454   NA 139 07/29/2018 0947   K 4.0 01/01/2021 1454   CL 103 01/01/2021 1454   CO2 21 (L) 01/01/2021 1454   GLUCOSE 177 (H) 01/01/2021 1454   BUN 17 01/01/2021 1454   BUN 13 07/29/2018 0947   CREATININE 1.56 (H) 01/01/2021 1454   CREATININE 1.30 (H) 08/17/2015 1557   CALCIUM 9.0 01/01/2021 1454   PROT 6.8 01/01/2021 1454   PROT 7.3 07/29/2018 0947   ALBUMIN 3.6 01/01/2021 1454   ALBUMIN 4.6 07/29/2018 0947   AST 17 01/01/2021 1454   ALT 20 01/01/2021 1454   ALKPHOS 50 01/01/2021 1454   BILITOT 0.8  01/01/2021 1454   BILITOT 0.4 07/29/2018 0947   GFRNONAA 47 (L) 01/01/2021 1454   GFRNONAA 57 (L) 11/22/2013 1730   GFRAA 58 (L) 08/25/2018 0557   GFRAA 66 11/22/2013 1730   Imaging I have reviewed the images obtained: CT head with no acute changes.  Chronic changes as in the official report. MRI brain without acute infarction, or bleed Known V4 occlusion on the left redemonstrated.  Assessment: 70 year old man with above past medical history presenting for an episode of speech disturbance-both aphasia and dysarthria along with lower extremity weakness right worse than left. Has some residual minimal right-sided weakness from prior stroke. MRI negative for acute infarction New TIA versus recrudescence of old symptoms Also had prior episode for hypoglycemia causing strokelike symptoms versus concern for seizures  Impression: - New stroke versus TIA - Recrudescence of old stroke symptoms  Recommendations: Admit to hospitalist Frequent checks Telemetry Aspirin 81 +  Plavix 75 for 3 weeks followed by Plavix only. High-dose statin MRA head-borderline renal function-would avoid contrast Carotid ultrasound for neck vessels 2d echo PT OT speech therapy Permissive hypertension-allow for blood pressures to be high and treat only if systolic greater than 220 on a as needed basis. Check UA, CXR to rule out infection as a cause of recrudescence  N.p.o. until cleared by bedside stroke swallow valuation.  Stroke team will follow  -- Milon Dikes, MD Neurologist [facility] Pager: [phone]

## 2021-01-01 NOTE — ED Notes (Signed)
Pt ambulated to restroom. Unsteady gait. One person assist for safety.

## 2021-01-01 NOTE — H&P (Signed)
History and Physical  Patient Name: Jeff [name]     UXN:[id number]    DOB: [birth year]    DOA: 01/01/2021 PCP: Anson Fret, MD  Patient coming from: Home  Chief Complaint: Right leg weakness and fall      HPI: Jeff [name] is a 70 y.o. male patient with hx diabetes, hyperlipidemia, hypertension and prior stroke with right leg weakness who presents with worsening right leg weakness, fall and difficulty with speech.  Patient was relatively doing well at around 8 PM when he went to bed last night.  This morning, he woke up late and when he got out to go to the bathroom, he felt weak on his right leg and fell.  He crawled back to his bed and waited until his wife returned from church.  When his wife returned from church, she noticed that she was having difficulty with his speech.  He was subsequently brought to the ED by EMS. He denies having cough or shortness of breath.  No chest pain.  No headache or blurring of vision.  ED course: In the emergency room, CT of the head was obtained which was negative for any acute changes.  EKG was normal sinus rhythm.  His blood work overall was unremarkable except for creatinine of 4.56.  His white blood cell count is mildly elevated at 11.8.    ROS: Negative except stated in HPI.      Past Medical History:  Diagnosis Date   ADHD (attention deficit hyperactivity disorder)    Diabetes mellitus    Hyperlipidemia    Hypertension    Renal disorder    Stroke [facility])     Past Surgical History:  Procedure Laterality Date   NECK SURGERY     SPINE SURGERY     Cervical spine x 2; [employer]; [employer].    Social History: Patient lives his wife.  The patient walks mostly without assistance..    Allergies  Allergen Reactions   Flexeril [Cyclobenzaprine Hcl] Other (See Comments)    Causes him to pass out and lowers his BP   Lidocaine Swelling    throat   Novocain [Procaine Hcl] Swelling    Family history: Patient's family history includes Cancer  in his brother; Diabetes in his mother; Heart disease in his father and mother.  Prior to Admission medications   Medication Sig Start Date End Date Taking? Authorizing Provider  clopidogrel (PLAVIX) 75 MG tablet TAKE 1 TABLET BY MOUTH DAILY 08/28/18  Yes Lezlie Lye, Irma M, MD  insulin glargine (LANTUS) 100 UNIT/ML injection Inject 0.25 mLs (25 Units total) into the skin 2 (two) times daily. 03/11/20  Yes Sheikh, Omair Latif, DO  levothyroxine (SYNTHROID) 50 MCG tablet Take 50 mcg by mouth daily before breakfast.   Yes [provider]  lisinopril (ZESTRIL) 2.5 MG tablet Take 2.5 mg by mouth daily.   Yes [provider]  metFORMIN (GLUCOPHAGE-XR) 500 MG 24 hr tablet Take 500 mg by mouth in the morning and at bedtime.    Yes [provider]  pantoprazole (PROTONIX) 40 MG tablet TAKE 1 TABLET BY MOUTH AT BEDTIME 08/28/18  Yes Lezlie Lye, Irma M, MD  rosuvastatin (CRESTOR) 40 MG tablet TAKE 1 TABLET BY MOUTH DAILY 08/28/18  Yes Lezlie Lye, Meda Coffee, MD  acetaminophen (TYLENOL) 325 MG tablet Take 1-2 tablets (325-650 mg total) by mouth every 4 (four) hours as needed for mild pain. 08/27/18   Batterson, Evlyn Kanner, PA-C  glucose blood (ONETOUCH VERIO) test  strip 1 each by Other route 4 (four) times daily -  before meals and at bedtime. Patient taking differently: 1 each by Other route 4 (four) times daily -  before meals and at bedtime. dexcom 6 08/27/18   Solecki, Evlyn Kanner, PA-C  Insulin Syringe-Needle U-100 (B-D INS SYR ULTRAFINE 1CC/31G) 31G X 5/16" 1 ML MISC Used to inject insulin twice daily. 08/16/17   Romero Belling, MD  senna-docusate (SENOKOT-S) 8.6-50 MG tablet Take 1 tablet by mouth at bedtime as needed for mild constipation. 03/11/20   Merlene Laughter, DO       Physical Exam: BP 122/81   Pulse 75   Temp 99 F (37.2 C) (Oral)   Resp 14   SpO2 100%  General: Not in obvious distress, alert awake HENT:   No scleral pallor or icterus noted. Oral mucosa is moist.   Chest:   Clear to auscultation bilaterally. No crackles or wheezes.  CVS: S1 &S2 heard. No murmur.  Regular rate and rhythm. Abdomen: Soft, nontender, nondistended.  Bowel sounds are normoactive. Extremities: No cyanosis, or edema.   Psych: Alert, awake.  Normal mood and affect CNS:  No cranial nerve deficits.  Power equal in all extremities.   Skin: Warm and dry.  No rashes noted.    Labs on Admission:  I have personally reviewed following labs and imaging studies: CBC: Recent Labs  Lab 01/01/21 1454  WBC 11.8*  HGB 14.2  HCT 41.1  MCV 83.4  PLT 213   Basic Metabolic Panel: Recent Labs  Lab 01/01/21 1454  NA 134*  K 4.0  CL 103  CO2 21*  GLUCOSE 177*  BUN 17  CREATININE 1.56*  CALCIUM 9.0   GFR: CrCl cannot be calculated (Unknown ideal weight.).  Liver Function Tests: Recent Labs  Lab 01/01/21 1454  AST 17  ALT 20  ALKPHOS 50  BILITOT 0.8  PROT 6.8  ALBUMIN 3.6   No results for input(s): LIPASE, AMYLASE in the last 168 hours. No results for input(s): AMMONIA in the last 168 hours. Coagulation Profile: No results for input(s): INR, PROTIME in the last 168 hours. Cardiac Enzymes: No results for input(s): CKTOTAL, CKMB, CKMBINDEX, TROPONINI in the last 168 hours. BNP (last 3 results) No results for input(s): PROBNP in the last 8760 hours. HbA1C: No results for input(s): HGBA1C in the last 72 hours. CBG: Recent Labs  Lab 01/01/21 1518  GLUCAP 180*   Lipid Profile: No results for input(s): CHOL, HDL, LDLCALC, TRIG, CHOLHDL, LDLDIRECT in the last 72 hours. Thyroid Function Tests: No results for input(s): TSH, T4TOTAL, FREET4, T3FREE, THYROIDAB in the last 72 hours. Anemia Panel: No results for input(s): VITAMINB12, FOLATE, FERRITIN, TIBC, IRON, RETICCTPCT in the last 72 hours.   No results found for this or any previous visit (from the past 240 hour(s)).         Radiological Exams on Admission: Personally reviewed:  CT Head Wo  Contrast  Result Date: 01/01/2021 CLINICAL DATA:  Aphasia and weakness. EXAM: CT HEAD WITHOUT CONTRAST TECHNIQUE: Contiguous axial images were obtained from the base of the skull through the vertex without intravenous contrast. COMPARISON:  Brain CT 03/10/2020 FINDINGS: Brain: Ventricles and sulci are prominent compatible with atrophy. Periventricular and subcortical white matter hypodensities compatible with chronic microvascular ischemic changes. No evidence for acute cortically based infarct, intracranial hemorrhage, mass lesion or mass-effect. Probable chronic lacunar infarct within the left basal ganglia. Vascular: Unremarkable Skull: Intact Sinuses/Orbits: Paranasal sinuses are well aerated. Mastoid air cells  are unremarkable. Other: None. IMPRESSION: No acute intracranial process. Electronically Signed   By: Annia Belt M.D.   On: 01/01/2021 14:47   DG Chest Port 1 View  Result Date: 01/01/2021 CLINICAL DATA:  Weakness.  TIA. EXAM: PORTABLE CHEST 1 VIEW COMPARISON:  May 30, 2011 FINDINGS: The heart size and mediastinal contours are within normal limits. Both lungs are clear. The visualized skeletal structures are unremarkable. IMPRESSION: No active disease. Electronically Signed   By: Gerome Sam III M.D   On: 01/01/2021 15:11    EKG: Independently reviewed.        Assessment/Plan   TIA Patient presenting with right leg weakness as well as difficulty with speech.  Symptoms have mostly resolved.  Will obtain MRI of the brain, carotid Dopplers and echocardiogram.  PT/OT eval.  Aspirin was given in the ED.  Will continue with same.  Continue with Plavix as well as Crestor.  Diabetes Resume insulin glargine at a lower dose.  Keep him on SSI scale with short acting insulin coverage.  Hypertension Permissive hypertension for now.  Will hold lisinopril.  Hyperlipidemia. Continue with Crestor.   DVT prophylaxis: Lovenox Code Status: Full code Family Communication: Discussed  with patient as well as his wife. Consultation: Neuro consultation called. Disposition Plan: Anticipate discharge tomorrow   Admission status: Observation   At the point of initial evaluation, it is my clinical opinion that admission for OBSERVATION is reasonable and necessary because the patient's presenting complaints in the context of their chronic conditions represent sufficient risk of deterioration or significant morbidity to constitute reasonable grounds for close observation in the hospital setting, but that the patient may be medically stable for discharge from the hospital within 24 to 48 hours.    Medical decision making: Patient seen at 5:50 PM on 01/01/2021.  The patient was discussed with his wife.  What exists of the patient's chart was reviewed in depth and summarized above.  Clinical condition: Stable.Arsenio Loader [facility] Please page though AMION or Epic secure chat:  For password, contact charge nurse

## 2021-01-01 NOTE — ED Provider Notes (Signed)
[facility] EMERGENCY DEPARTMENT Provider Note   CSN: [id number] Arrival date & time: 01/01/21  1339     History Chief Complaint  Patient presents with   Transient Ischemic Attack    Jeff [name] is a 70 y.o. male.  HPI 70 year old male presents today complaining of increased right-sided weakness.  Patient states that he has some residual right-sided deficit from stroke but normally can walk without difficulty.  Last known normal was last night at 8:00 when he went to bed.  He was more tired than usual and he slept in much later than usual.  He got up around 1130 and fell due to his right leg weakness.  He has some mild headache.  He did not strike his head when he fell.  He denies visual changes, difficulty speaking, or sensory deficit.    Past Medical History:  Diagnosis Date   ADHD (attention deficit hyperactivity disorder)    Diabetes mellitus    Hyperlipidemia    Hypertension    Renal disorder    Stroke [facility])     Patient Active Problem List   Diagnosis Date Noted   TIA (transient ischemic attack) 03/10/2020   CKD (chronic kidney disease) 08/28/2018   Retinopathy of both eyes 08/28/2018   Stroke due to embolism of left vertebral artery (HCC) 08/12/2018   Stroke (HCC) 08/08/2018   Diabetes mellitus (HCC) 07/25/2015   Neck injury 06/22/2015   Hearing loss in right ear 10/14/2011   Agent orange exposure 10/14/2011   ADD (attention deficit disorder) 10/14/2011   Hyperlipidemia 10/14/2011   BMI 28.0-28.9,adult 10/14/2011    Past Surgical History:  Procedure Laterality Date   NECK SURGERY     SPINE SURGERY     Cervical spine x 2; Vear Clock; [facility].       Family History  Problem Relation Age of Onset   Heart disease Mother        CHF   Diabetes Mother    Heart disease Father    Cancer Brother     Social History   Tobacco Use   Smoking status: Never   Smokeless tobacco: Never  Vaping Use   Vaping Use: Never used  Substance Use Topics    Alcohol use: No    Alcohol/week: 0.0 standard drinks   Drug use: No    Home Medications Prior to Admission medications   Medication Sig Start Date End Date Taking? Authorizing Provider  acetaminophen (TYLENOL) 325 MG tablet Take 1-2 tablets (325-650 mg total) by mouth every 4 (four) hours as needed for mild pain. 08/27/18   Jacquelynn Cree, PA-C  clopidogrel (PLAVIX) 75 MG tablet TAKE 1 TABLET BY MOUTH DAILY 08/28/18   Lezlie Lye, Meda Coffee, MD  glucose blood [facility] VERIO) test strip 1 each by Other route 4 (four) times daily -  before meals and at bedtime. Patient taking differently: 1 each by Other route 4 (four) times daily -  before meals and at bedtime. dexcom 6 08/27/18   Mcglothlin, Evlyn Kanner, PA-C  insulin glargine (LANTUS) 100 UNIT/ML injection Inject 0.25 mLs (25 Units total) into the skin 2 (two) times daily. 03/11/20   Sheikh, Kateri Mc Latif, DO  Insulin Syringe-Needle U-100 (B-D INS SYR ULTRAFINE 1CC/31G) 31G X 5/16" 1 ML MISC Used to inject insulin twice daily. 08/16/17   Romero Belling, MD  levothyroxine (SYNTHROID) 50 MCG tablet Take 50 mcg by mouth daily before breakfast.    [provider]  lisinopril (ZESTRIL) 2.5 MG tablet Take  2.5 mg by mouth daily.    [provider]  metFORMIN (GLUCOPHAGE-XR) 500 MG 24 hr tablet Take 500 mg by mouth in the morning and at bedtime.     [provider]  pantoprazole (PROTONIX) 40 MG tablet TAKE 1 TABLET BY MOUTH AT BEDTIME 08/28/18   Lezlie Lye, Meda Coffee, MD  rosuvastatin (CRESTOR) 40 MG tablet TAKE 1 TABLET BY MOUTH DAILY 08/28/18   Lezlie Lye, Meda Coffee, MD  senna-docusate (SENOKOT-S) 8.6-50 MG tablet Take 1 tablet by mouth at bedtime as needed for mild constipation. 03/11/20   Marguerita Merles Latif, DO    Allergies    Flexeril [cyclobenzaprine hcl], Lidocaine, and Novocain [procaine hcl]  Review of Systems   Review of Systems  All other systems reviewed and are negative.  Physical Exam Updated Vital Signs BP (!)  145/81   Pulse 82   Resp 19   SpO2 93%   Physical Exam Vitals and nursing note reviewed.  Constitutional:      Appearance: He is normal weight.  HENT:     Head: Normocephalic.     Right Ear: External ear normal.     Left Ear: External ear normal.     Nose: Nose normal.     Mouth/Throat:     Pharynx: Oropharynx is clear.  Eyes:     Pupils: Pupils are equal, round, and reactive to light.  Cardiovascular:     Rate and Rhythm: Normal rate and regular rhythm.     Pulses: Normal pulses.     Heart sounds: Normal heart sounds.  Pulmonary:     Effort: Pulmonary effort is normal.  Abdominal:     General: Abdomen is flat. Bowel sounds are normal.     Palpations: Abdomen is soft.     Comments: Mild left-sided erythema of the abdomen with induration and no fluctuance appears to be well-healing Insulin pump in place directly above the  Musculoskeletal:        General: Normal range of motion.     Cervical back: Normal range of motion.  Skin:    General: Skin is warm.     Capillary Refill: Capillary refill takes less than 2 seconds.  Neurological:     Mental Status: He is alert.     Cranial Nerves: Cranial nerves are intact.     Sensory: Sensation is intact.     Motor: Weakness present.     Coordination: Romberg sign negative.     Deep Tendon Reflexes:     Reflex Scores:      Bicep reflexes are 1+ on the right side and 1+ on the left side.      Patellar reflexes are 2+ on the right side and 2+ on the left side.    Comments: Patient unable to lift right leg straight off the bed Right palmar drift No visual field deficits Extraocular movements intact No sensory deficit noted    ED Results / Procedures / Treatments   Labs (all labs ordered are listed, but only abnormal results are displayed) Labs Reviewed  CBC - Abnormal; Notable for the following components:      Result Value   WBC 11.8 (*)    All other components within normal limits  COMPREHENSIVE METABOLIC PANEL -  Abnormal; Notable for the following components:   Sodium 134 (*)    CO2 21 (*)    Glucose, Bld 177 (*)    Creatinine, Ser 1.56 (*)    GFR, Estimated 47 (*)  All other components within normal limits  CBG MONITORING, ED - Abnormal; Notable for the following components:   Glucose-Capillary 180 (*)    All other components within normal limits  RESP PANEL BY RT-PCR (FLU A&B, COVID) [id number]  URINALYSIS, ROUTINE W REFLEX MICROSCOPIC    EKG EKG Interpretation  Date/Time:  Sunday January 01 2021 14:40:56 EDT Ventricular Rate:  84 PR Interval:  148 QRS Duration: 88 QT Interval:  379 QTC Calculation: 448 R Axis:   35 Text Interpretation: Sinus rhythm Borderline T wave abnormalities Confirmed by Aram Domzalski ([id number]) on 01/01/2021 3:45:39 PM  Radiology CT Head Wo Contrast  Result Date: 01/01/2021 CLINICAL DATA:  Aphasia and weakness. EXAM: CT HEAD WITHOUT CONTRAST TECHNIQUE: Contiguous axial images were obtained from the base of the skull through the vertex without intravenous contrast. COMPARISON:  Brain CT 03/10/2020 FINDINGS: Brain: Ventricles and sulci are prominent compatible with atrophy. Periventricular and subcortical white matter hypodensities compatible with chronic microvascular ischemic changes. No evidence for acute cortically based infarct, intracranial hemorrhage, mass lesion or mass-effect. Probable chronic lacunar infarct within the left basal ganglia. Vascular: Unremarkable Skull: Intact Sinuses/Orbits: Paranasal sinuses are well aerated. Mastoid air cells are unremarkable. Other: None. IMPRESSION: No acute intracranial process. Electronically Signed   By: Drew  Davis M.D.   On: 01/01/2021 14:47   DG Chest Port 1 View  Result Date: 01/01/2021 CLINICAL DATA:  Weakness.  TIA. EXAM: PORTABLE CHEST 1 VIEW COMPARISON:  May 30, 2011 FINDINGS: The heart size and mediastinal contours are within normal limits. Both lungs are clear. The visualized skeletal structures are unremarkable.  IMPRESSION: No active disease. Electronically Signed   By: David  Williams III M.D   On: 01/01/2021 15:11    Procedures Procedures   Medications Ordered in ED Medications - No data to display  ED Course  I have reviewed the triage vital signs and the nursing notes.  Pertinent labs & imaging results that were available during my care of the patient were reviewed by me and considered in my medical decision making (see chart for details).    MDM Rules/Calculators/A&P                           70  year old male presents today complaining of increased fatigue and worsened right-sided weakness with fall from baseline.  No acute injuries noted from fall.  Head CT without evidence of acute intracranial process. Patient reports some subjective low-grade fevers at home.  He had recent abdominal cutaneous abscess drained.  This appears to be well-healing Awaiting rectal temp, urinalysis Plan admission for worsening weakness with probable new stroke as patient is unable to walk and has fallen Discussed care with Dr. has assumed care at this time  Final Clinical Impression(s) / ED Diagnoses Final diagnoses:  Right sided weakness    Rx / DC Orders ED Discharge Orders     None        Bernette Mayers, MD 01/01/21 1554

## 2021-01-01 NOTE — ED Triage Notes (Signed)
Pt BIB [facility] from home after wife calling at 1200 for TIA. Wife reported aphasia and weakness to EMS. EMS reports aphasia, weakness, and decreased sensation bilaterally to lower extremities. EMS reports at 1300 speech symptoms and decreased sensation dramatically improved, weakness remains. LKW 2100 last night, pt reports aphasia at waking but doesn't know what time he woke up.

## 2021-01-02 ENCOUNTER — Observation Stay (HOSPITAL_BASED_OUTPATIENT_CLINIC_OR_DEPARTMENT_OTHER): Payer: No Typology Code available for payment source

## 2021-01-02 ENCOUNTER — Encounter (HOSPITAL_COMMUNITY): Payer: Self-pay | Admitting: Internal Medicine

## 2021-01-02 ENCOUNTER — Observation Stay (HOSPITAL_COMMUNITY): Payer: No Typology Code available for payment source

## 2021-01-02 DIAGNOSIS — G459 Transient cerebral ischemic attack, unspecified: Secondary | ICD-10-CM | POA: Diagnosis not present

## 2021-01-02 DIAGNOSIS — E119 Type 2 diabetes mellitus without complications: Secondary | ICD-10-CM

## 2021-01-02 LAB — LIPID PANEL
Cholesterol: 96 mg/dL (ref 0–200)
HDL: 25 mg/dL — ABNORMAL LOW (ref >40)
LDL Cholesterol: 41 mg/dL (ref 0–99)
Total CHOL/HDL Ratio: 3.8 RATIO
Triglycerides: 152 mg/dL — ABNORMAL HIGH (ref <150)
VLDL: 30 mg/dL (ref 0–40)

## 2021-01-02 LAB — HEMOGLOBIN A1C
Hgb A1c MFr Bld: 8.6 % — ABNORMAL HIGH (ref 4.8–5.6)
Mean Plasma Glucose: 200.12 mg/dL

## 2021-01-02 LAB — ECHOCARDIOGRAM COMPLETE
Area-P 1/2: 3.21 cm2
Calc EF: 65.9 %
Height: 68 in
S' Lateral: 2.7 cm
Single Plane A2C EF: 73 %
Single Plane A4C EF: 57.7 %
Weight: 3296 oz

## 2021-01-02 LAB — GLUCOSE, CAPILLARY
Glucose-Capillary: 194 mg/dL — ABNORMAL HIGH (ref 70–99)
Glucose-Capillary: 258 mg/dL — ABNORMAL HIGH (ref 70–99)
Glucose-Capillary: 321 mg/dL — ABNORMAL HIGH (ref 70–99)
Glucose-Capillary: 270 mg/dL — ABNORMAL HIGH (ref 70–99)

## 2021-01-02 MED ORDER — ASPIRIN EC 81 MG PO TBEC
81.0000 mg | DELAYED_RELEASE_TABLET | Freq: Every day | ORAL | Status: DC
  Administered 2021-01-02 – 2021-01-03 (×2): 81 mg via ORAL
  Filled 2021-01-02 (×2): qty 1

## 2021-01-02 MED ORDER — PERFLUTREN LIPID MICROSPHERE
1.0000 mL | INTRAVENOUS | Status: AC | PRN
  Administered 2021-01-02: 2 mL via INTRAVENOUS
  Filled 2021-01-02: qty 10

## 2021-01-02 MED ORDER — INSULIN ASPART 100 UNIT/ML IJ SOLN
0.0000 [IU] | Freq: Every day | INTRAMUSCULAR | Status: DC
  Administered 2021-01-02: 3 [IU] via SUBCUTANEOUS

## 2021-01-02 MED ORDER — INSULIN ASPART 100 UNIT/ML IJ SOLN
0.0000 [IU] | Freq: Three times a day (TID) | INTRAMUSCULAR | Status: DC
Start: 2021-01-02 — End: 2021-01-03
  Administered 2021-01-02: 15 [IU] via SUBCUTANEOUS
  Administered 2021-01-03: 4 [IU] via SUBCUTANEOUS
  Administered 2021-01-03: 3 [IU] via SUBCUTANEOUS

## 2021-01-02 NOTE — Progress Notes (Signed)
Occupational Therapy Evaluation Patient Details Name: Jeff [name] MRN: [id number] DOB: Nov 27, 1950 Today's Date: 01/02/2021    History of Present Illness 70 yo male presents to [facility] on 7/[facility] for aphasia, increasing R weakness with fall, altered sensation LEs. CTH negative for acute findings, workup for TIA. PMH includes CVA with residual mild R weakness, HLD, DM,HTN, retinopathy, CKD.   Clinical Impression   PTA pt lives with his wife and was independent with ADL and mobility.Recently stopped using a cane this June after being discharged from outpt PT. Pt has not driven since his stroke. No OT follow up recommended. OT signing off.     Follow Up Recommendations  No OT follow up    Equipment Recommendations  None recommended by OT    Recommendations for Other Services PT consult     Precautions / Restrictions Precautions Precautions: Fall      Mobility Bed Mobility Overal bed mobility: Modified Independent                  Transfers Overall transfer level: Needs assistance   Transfers: Sit to/from Stand Sit to Stand: Supervision              Balance Overall balance assessment: Mild deficits observed, not formally tested                                         ADL either performed or assessed with clinical judgement   ADL Overall ADL's : At baseline                                             Vision Baseline Vision/History: Wears glasses Additional Comments: wears reading glasses     Perception     Praxis      Pertinent Vitals/Pain Pain Assessment: No/denies pain     Hand Dominance Right   Extremity/Trunk Assessment Upper Extremity Assessment Upper Extremity Assessment: RUE deficits/detail RUE Deficits / Details: residual R sided weakness but functional   Lower Extremity Assessment Lower Extremity Assessment: Defer to PT evaluation   Cervical / Trunk Assessment Cervical / Trunk Assessment: Normal    Communication Communication Communication: HOH   Cognition Arousal/Alertness: Awake/alert Behavior During Therapy: Impulsive Overall Cognitive Status: History of cognitive impairments - at baseline                                     General Comments       Exercises     Shoulder Instructions      Home Living Family/patient expects to be discharged to:: Private residence Living Arrangements: Spouse/significant other Available Help at Discharge: Family;Available 24 hours/day Type of Home: House Home Access: Stairs to enter;Ramped entrance Entrance Stairs-Number of Steps: 3   Home Layout: One level     Bathroom Shower/Tub: Tub/shower unit;Walk-in shower   Bathroom Toilet: Standard Bathroom Accessibility: Yes How Accessible: Accessible via walker Home Equipment: Walker - 2 wheels;Cane - single point;Bedside commode;Shower seat          Prior Functioning/Environment Level of Independence: Independent        Comments: Recently stopped using caneinJune        OT Problem List: Decreased safety awareness;Obesity  OT Treatment/Interventions:      OT Goals(Current goals can be found in the care plan section) Acute Rehab OT Goals Patient Stated Goal: to go home OT Goal Formulation: All assessment and education complete, DC therapy  OT Frequency:     Barriers to D/C:            Co-evaluation              AM-PAC OT "6 Clicks" Daily Activity     Outcome Measure Help from another person eating meals?: None Help from another person taking care of personal grooming?: None Help from another person toileting, which includes using toliet, bedpan, or urinal?: None Help from another person bathing (including washing, rinsing, drying)?: None Help from another person to put on and taking off regular upper body clothing?: None Help from another person to put on and taking off regular lower body clothing?: None 6 Click Score: 24   End of  Session Nurse Communication: Mobility status  Activity Tolerance: Patient tolerated treatment well Patient left: in chair;with call bell/phone within reach;with chair alarm set  OT Visit Diagnosis: Unsteadiness on feet (R26.81)                Time: 1050-1110 OT Time Calculation (min): 20 min Charges:  OT General Charges $OT Visit: 1 Visit OT Evaluation $OT Eval Low Complexity: 1 Low  Luisa Dago, OT/L   Acute OT Clinical Specialist Acute Rehabilitation Services Pager [phone] Office [phone]   [facility] 01/02/2021, 11:26 AM

## 2021-01-02 NOTE — Care Management Obs Status (Signed)
MEDICARE OBSERVATION STATUS NOTIFICATION   Patient Details  Name: Jeff [name] MRN: [id number] Date of Birth: 1951/03/08   Medicare Observation Status Notification Given:  Yes    Kermit Balo, RN 01/02/2021, 3:37 PM

## 2021-01-02 NOTE — TOC Initial Note (Signed)
Transition of Care [facility]) - Initial/Assessment Note    Patient Details  Name: Jeff [name] MRN: [id number] Date of Birth: 01/27/1951  [facility]) CM/SW Contact:    Pollie Friar, RN Phone Number: 01/02/2021, 4:03 PM  Clinical Narrative:                 Recommendations for Outpatient PT. Cm met with the patient and he has done PT with [facility] and the [facility] in the past and would like to attend this again. CM called the [facility] and they do offer the Aquatic therapy. Orders sent to [facility]. Information on the AVS.  Wife able to provide needed transport.  Pt denies issues with home meds.  PCP at [place]: Dr Landry Mellow CSW: Janora Norlander: [phone] ext [phone] TOC following.  Expected Discharge Plan: OP Rehab Barriers to Discharge: Continued Medical Work up   Patient Goals and CMS Choice   CMS Medicare.gov Compare Post Acute Care list provided to:: (P) Patient Choice offered to / list presented to : Patient  Expected Discharge Plan and Services Expected Discharge Plan: OP Rehab   Discharge Planning Services: CM Consult   Living arrangements for the past 2 months: Single Family Home                                      Prior Living Arrangements/Services Living arrangements for the past 2 months: Single Family Home Lives with:: Spouse Patient language and need for interpreter reviewed:: Yes Do you feel safe going back to the place where you live?: Yes      Need for Family Participation in Patient Care: Yes (Comment) Care giver support system in place?: Yes (comment) Current home services: (P) DME (all needed but doesnt use DME currently) Criminal Activity/Legal Involvement Pertinent to Current Situation/Hospitalization: No - Comment as needed  Activities of Daily Living      Permission Sought/Granted                  Emotional Assessment Appearance:: Appears stated age Attitude/Demeanor/Rapport: Engaged Affect  (typically observed): Accepting Orientation: : Oriented to Self, Oriented to Place, Oriented to  Time, Oriented to Situation   Psych Involvement: No (comment)  Admission diagnosis:  TIA (transient ischemic attack) [G45.9] Right sided weakness [R53.1] Patient Active Problem List   Diagnosis Date Noted   TIA (transient ischemic attack) 03/10/2020   CKD (chronic kidney disease) 08/28/2018   Retinopathy of both eyes 08/28/2018   Stroke due to embolism of left vertebral artery ([place]) 08/12/2018   Stroke ([place]) 08/08/2018   Diabetes mellitus ([place]) 07/25/2015   Neck injury 06/22/2015   Hearing loss in right ear 10/14/2011   Agent orange exposure 10/14/2011   ADD (attention deficit disorder) 10/14/2011   Hyperlipidemia 10/14/2011   BMI 28.0-28.9,adult 10/14/2011   PCP:  Landry Mellow, MD Pharmacy:   [place], [place] - [address] [place] [address] Phone: [phone] Fax: [phone]  [facility] [place], [place] - [address] AT [address] [place] [address] Phone: [phone] Fax: [phone]  [facility] DR[facility] STORE [place], [address] [place] [address] Phone: [phone] Fax: [phone]     Social Determinants of Health (  SDOH) Interventions    Readmission Risk Interventions No flowsheet data found.

## 2021-01-02 NOTE — Plan of Care (Signed)
?  Problem: Clinical Measurements: ?Goal: Diagnostic test results will improve ?Outcome: Progressing ?  ?Problem: Safety: ?Goal: Ability to remain free from injury will improve ?Outcome: Progressing ?  ?

## 2021-01-02 NOTE — Progress Notes (Signed)
Carotid duplex bilateral attempted. Patient with provider. Will attempt again as schedule and patient availability permits.   Jean Rosenthal, RDMS, RVT

## 2021-01-02 NOTE — H&P (Signed)
Chief Complaint: Patient was seen in consultation today for diagnostic cervical and cerebral arteriogram Chief Complaint  Patient presents with   Transient Ischemic Attack   at the request of Dr. Pearlean Brownie   Referring Physician(s): Dr. Pearlean Brownie  Supervising Physician: Julieanne Cotton  Patient Status: [facility] - In-pt  History of Present Illness: Jeff [name] is a 70 y.o. male with PMH of DM, HTN, HTN, stroke in 2020 s/p tPA with residual right sided weakness who presented to ED on 01/02/2019 due to worsening right leg weakness, fall and difficulty speech. CT head without contrast showed no acute intracranial process, MRI/MRA showed:  Negative intracranial MRA for large vessel occlusion. 2.  Atherosclerotic change involving both carotid siphons with associated moderate stenosis at the anterior genu of the cavernous left ICA.  Patient was admitted for TIA/stroke work-up and was evaluated by Dr. Pearlean Brownie; however, patient's neurological symptoms improved rapidly and neuro exam was fairly normal at the time of admission.  However, due to severe carotid siphon stenosis shown an MRA and previous CT angiogram from last admission, diagnostic cerebral arteriogram was recommended for further evaluation.  After thorough discussion and surgery making, patient decided to proceed with the diagnostic cerebral arteriogram.  NIR was requested for diagnostic cervical and cerebral arteriogram. Case was reviewed and approved by Dr. Corliss Skains.  Patient was evaluated in the patient room. Patient laying in bed, not in acute distress.  His wife at the bedside Patient states that he had some headache and fever earlier today, but not anymore. Denise headache, fever, chills, shortness of breath, cough, chest pain, abdominal pain, nausea ,vomiting, and bleeding.  Past Medical History:  Diagnosis Date   ADHD (attention deficit hyperactivity disorder)    Diabetes mellitus    Hyperlipidemia    Hypertension    Renal  disorder    Stroke [facility])     Past Surgical History:  Procedure Laterality Date   NECK SURGERY     SPINE SURGERY     Cervical spine x 2; [employer]; [employer].    Allergies: Flexeril [cyclobenzaprine hcl], Lidocaine, and Novocain [procaine hcl]  Medications: Prior to Admission medications   Medication Sig Start Date End Date Taking? Authorizing Provider  acetaminophen (TYLENOL) 500 MG tablet Take 500-1,000 mg by mouth every 6 (six) hours as needed for headache.   Yes [provider]  Cholecalciferol (VITAMIN D) 50 MCG (2000 UT) tablet Take 2,000 Units by mouth daily.   Yes [provider]  clopidogrel (PLAVIX) 75 MG tablet TAKE 1 TABLET BY MOUTH DAILY 08/28/18  Yes Lezlie Lye, Meda Coffee, MD  Glucagon 1 MG/0.2ML SOAJ Inject 1 mg into the skin as needed (low blood sugar).   Yes [provider]  insulin aspart (NOVOLOG FLEXPEN) 100 UNIT/ML FlexPen Inject 23 Units into the skin 3 (three) times daily as needed for high blood sugar. Inject 23 units TID PRN If BS >120 BUT HOLD if BS<120   Yes [provider]  insulin glargine (LANTUS) 100 UNIT/ML injection Inject 0.25 mLs (25 Units total) into the skin 2 (two) times daily. Patient taking differently: Inject 45-55 Units into the skin 2 (two) times daily. Inject 45 units in the morning and 55 units at bedtime 03/11/20  Yes Sheikh, Omair Latif, DO  levothyroxine (SYNTHROID) 50 MCG tablet Take 50 mcg by mouth daily before breakfast.   Yes [provider]  lisinopril (ZESTRIL) 2.5 MG tablet Take 2.5 mg by mouth daily.   Yes [provider]  Magnesium Oxide 420 MG  TABS Take 1 tablet by mouth in the morning and at bedtime.   Yes [provider]  metFORMIN (GLUCOPHAGE-XR) 500 MG 24 hr tablet Take 500 mg by mouth in the morning and at bedtime.    Yes [provider]  Multiple Vitamins-Minerals (ICAPS AREDS 2 PO) Take 1 capsule by mouth in the morning and at bedtime.   Yes [provider]  pantoprazole (PROTONIX) 40 MG tablet TAKE 1 TABLET BY MOUTH AT BEDTIME Patient taking differently: Take 40 mg by mouth daily. 08/28/18  Yes Lezlie Lye, Meda Coffee, MD  rosuvastatin (CRESTOR) 40 MG tablet TAKE 1 TABLET BY MOUTH DAILY 08/28/18  Yes Lezlie Lye, Irma M, MD  glucose blood [facility] VERIO) test strip 1 each by Other route 4 (four) times daily -  before meals and at bedtime. Patient taking differently: 1 each by Other route 4 (four) times daily -  before meals and at bedtime. dexcom 6 08/27/18   Shugars, Evlyn Kanner, PA-C  Insulin Syringe-Needle U-100 (B-D INS SYR ULTRAFINE 1CC/31G) 31G X 5/16" 1 ML MISC Used to inject insulin twice daily. 08/16/17   Romero Belling, MD     Family History  Problem Relation Age of Onset   Heart disease Mother        CHF   Diabetes Mother    Heart disease Father    Cancer Brother     Social History   Socioeconomic History   Marital status: Single    Spouse name: Not on file   Number of children: Not on file   Years of education: Not on file   Highest education level: Not on file  Occupational History   Occupation: Archivist  Tobacco Use   Smoking status: Never   Smokeless tobacco: Never  Vaping Use   Vaping Use: Never used  Substance and Sexual Activity   Alcohol use: No    Alcohol/week: 0.0 standard drinks   Drug use: No   Sexual activity: Yes  Other Topics Concern   Not on file  Social History Narrative   Not on file   Social Determinants of Health   Financial Resource Strain: Not on file  Food Insecurity: Not on file  Transportation Needs: Not on file  Physical Activity: Not on file  Stress: Not on file  Social Connections: Not on file     Review of Systems: A 12 point ROS discussed and pertinent positives are indicated in the HPI above.  All other systems are negative.   Vital Signs: BP 116/66   Pulse 75   Temp (!) 97.4 F (36.3 C) (Oral)   Resp 16   Ht  (1.727 m)   Wt 206 lb  (93.4 kg)   SpO2 98%   BMI 31.32 kg/m   Physical Exam Vitals reviewed.  Constitutional:      General: He is not in acute distress.    Appearance: Normal appearance. He is not ill-appearing.  HENT:     Head: Normocephalic and atraumatic.     Mouth/Throat:     Mouth: Mucous membranes are moist.  Cardiovascular:     Rate and Rhythm: Normal rate and regular rhythm.     Pulses: Normal pulses.     Heart sounds: Normal heart sounds.     Comments: Bilateral DP and PT palpable Pulmonary:     Effort: Pulmonary effort is normal.     Breath sounds: Normal breath sounds.  Abdominal:     General: Abdomen is flat.  Bowel sounds are normal.     Palpations: Abdomen is soft.  Musculoskeletal:     Cervical back: Neck supple.  Skin:    General: Skin is warm and dry.     Coloration: Skin is not jaundiced or pale.  Neurological:     Mental Status: He is alert and oriented to person, place, and time.  Psychiatric:        Mood and Affect: Mood normal.        Behavior: Behavior normal.        Judgment: Judgment normal.    MD Evaluation Airway: WNL Heart: WNL Abdomen: WNL Chest/ Lungs: WNL ASA  Classification: 3 Mallampati/Airway Score: Two  Imaging: CT Head Wo Contrast  Result Date: 01/01/2021 CLINICAL DATA:  Aphasia and weakness. EXAM: CT HEAD WITHOUT CONTRAST TECHNIQUE: Contiguous axial images were obtained from the base of the skull through the vertex without intravenous contrast. COMPARISON:  Brain CT 03/10/2020 FINDINGS: Brain: Ventricles and sulci are prominent compatible with atrophy. Periventricular and subcortical white matter hypodensities compatible with chronic microvascular ischemic changes. No evidence for acute cortically based infarct, intracranial hemorrhage, mass lesion or mass-effect. Probable chronic lacunar infarct within the left basal ganglia. Vascular: Unremarkable Skull: Intact Sinuses/Orbits: Paranasal sinuses are well aerated. Mastoid air cells are unremarkable.  Other: None. IMPRESSION: No acute intracranial process. Electronically Signed   By: Annia Belt M.D.   On: 01/01/2021 14:47   MR ANGIO HEAD WO CONTRAST  Result Date: 01/01/2021 CLINICAL DATA:  Initial evaluation for acute TIA. EXAM: MRA HEAD WITHOUT CONTRAST TECHNIQUE: Angiographic images of the Circle of Willis were acquired using MRA technique without intravenous contrast. COMPARISON:  Prior study from 03/10/2020. FINDINGS: Anterior circulation: Petrous segments patent bilaterally. Atheromatous change throughout the carotid siphons with associated moderate stenosis at the anterior genu of the cavernous left ICA (series 5, image 48). More mild multifocal narrowing of a noted about the right siphon. A1 segments patent bilaterally. Normal anterior communicating artery complex. Anterior cerebral arteries patent to their distal aspects without stenosis. No M1 stenosis or occlusion. Normal MCA bifurcations. Distal MCA branches perfused and symmetric. Posterior circulation: Neither vertebral artery well assessed on this exam. Basilar patent to its distal aspect without stenosis. Superior cerebral arteries patent bilaterally. Both PCAs primarily supplied via the basilar. Left PCA mildly irregular but well perfused to its distal aspect without stenosis. Moderate stenosis seen about the right P1 segment (series 1037, image 1). Right PCA irregular but otherwise patent to its distal aspect. Anatomic variants: None significant. Other: No aneurysm. IMPRESSION: 1. Negative intracranial MRA for large vessel occlusion. 2. Atherosclerotic change involving both carotid siphons with associated moderate stenosis at the anterior genu of the cavernous left ICA. 3. Moderate stenosis about the right P1 segment. Electronically Signed   By: Rise Mu M.D.   On: 01/01/2021 22:10   MR BRAIN WO CONTRAST  Result Date: 01/01/2021 CLINICAL DATA:  Transient ischemic attack. Additional history provided: Worsening right-sided leg  weakness, fall, difficulty with speech. EXAM: MRI HEAD WITHOUT CONTRAST TECHNIQUE: Multiplanar, multiecho pulse sequences of the brain and surrounding structures were obtained without intravenous contrast. COMPARISON:  Prior head CT examinations 01/01/2021 and earlier. Brain MRI 03/10/2020. CT angiogram head/neck 03/10/2020. FINDINGS: Brain: Moderate central predominant cerebral atrophy. Moderate multifocal T2/FLAIR hyperintensity within the cerebral white matter and pons, nonspecific but compatible with chronic small vessel ischemic disease. Redemonstrated chronic lacunar infarct within the right basal ganglia. There is no acute infarct. No evidence of an intracranial mass. No chronic intracranial  blood products. No extra-axial fluid collection. No midline shift. Vascular: Unchanged signal abnormality within the V4 left vertebral artery compatible with known chronic occlusion of this vessel. Flow voids are otherwise maintained within the proximal large arterial vessels. Skull and upper cervical spine: No focal marrow lesion. Sinuses/Orbits: Visualized orbits show no acute finding. Trace bilateral ethmoid, sphenoid and maxillary sinus mucosal thickening. IMPRESSION: No evidence of acute intracranial abnormality. Redemonstrated chronic lacunar infarct within the right basal ganglia. Moderate chronic small vessel ischemic changes within the cerebral white matter and pons, stable as compared to the brain MRI of 03/10/2020. Moderate central predominant cerebral atrophy, stable. 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 Minimal paranasal sinus disease, as described. Electronically Signed   By: Jackey Loge DO   On: 01/01/2021 19:10   CT ABDOMEN PELVIS W CONTRAST  Result Date: 12/10/2020 CLINICAL DATA:  Left abdominal wall swelling and redness after injecting insulin 6 days ago. EXAM: CT ABDOMEN AND PELVIS WITH CONTRAST TECHNIQUE: Multidetector CT imaging of the abdomen and pelvis was performed using the  standard protocol following bolus administration of intravenous contrast. CONTRAST:  2mL OMNIPAQUE IOHEXOL 300 MG/ML  SOLN COMPARISON:  09/04/2017 FINDINGS: Lower chest: Clear lung bases. Normal heart size without pericardial or pleural effusion. Tiny hiatal hernia. Hepatobiliary: Normal liver. Normal gallbladder, without biliary ductal dilatation. Pancreas: Normal, without mass or ductal dilatation. Spleen: Normal in size, without focal abnormality. Adrenals/Urinary Tract: Normal adrenal glands. Right renal cortical thinning. Normal left kidney. No hydronephrosis. Normal urinary bladder. Stomach/Bowel: Normal remainder of the stomach. Normal colon, appendix, and terminal ileum. Normal small bowel. Vascular/Lymphatic: Aortic atherosclerosis. No abdominopelvic adenopathy. Reproductive: Normal prostate. Other: No significant free fluid. Skin thickening and subcutaneous edema about the left anterior abdominal wall, including on 40/2. No drainable fluid collection or subcutaneous gas. No extension of inflammation into the intra-abdominal/intraperitoneal space. Musculoskeletal: No acute osseous abnormality. IMPRESSION: 1. Skin thickening and subcutaneous edema about the left anterior abdominal wall, consistent with cellulitis. No drainable fluid collection or subcutaneous gas. 2. No acute process in the abdomen or pelvis. 3. Tiny hiatal hernia. 4.  Aortic Atherosclerosis (ICD10-I70.0). Electronically Signed   By: Jeronimo Greaves M.D.   On: 12/10/2020 18:30   DG Chest Port 1 View  Result Date: 01/01/2021 CLINICAL DATA:  Weakness.  TIA. EXAM: PORTABLE CHEST 1 VIEW COMPARISON:  May 30, 2011 FINDINGS: The heart size and mediastinal contours are within normal limits. Both lungs are clear. The visualized skeletal structures are unremarkable. IMPRESSION: No active disease. Electronically Signed   By: Gerome Sam III M.D   On: 01/01/2021 15:11   ECHOCARDIOGRAM COMPLETE  Result Date: 01/02/2021    ECHOCARDIOGRAM  REPORT   Patient Name:   Jeff [name] Date of Exam: 01/02/2021 Medical Rec #:  [id number]  Height:       68.0 in Accession #:    [id number] Weight:       206.0 lb Date of Birth:  Oct 21, 1950  BSA:          2.070 m Patient Age:    70 years   BP:           116/67 mmHg Patient Gender: M          HR:           75 bpm. Exam Location:  Inpatient Procedure: 2D Echo, 3D Echo, Intracardiac Opacification Agent, Cardiac Doppler            and Color Doppler Indications:    TIA  History:        Patient has prior history of Echocardiogram examinations, most                 recent 03/10/2020. TIA and Stroke; Risk Factors:Dyslipidemia and                 Diabetes.  Sonographer:    Sheralyn Boatman RDCS Referring Phys: [id number] [facility]  Sonographer Comments: Technically difficult study due to poor echo windows. Image acquisition challenging due to patient body habitus. IMPRESSIONS  1. Left ventricular ejection fraction, by estimation, is 60 to 65%. The left ventricle has normal function. The left ventricle has no regional wall motion abnormalities. Left ventricular diastolic parameters were normal.  2. Right ventricular systolic function is normal. The right ventricular size is normal. Tricuspid regurgitation signal is inadequate for assessing PA pressure.  3. The mitral valve is normal in structure. No evidence of mitral valve regurgitation.  4. The aortic valve was not well visualized. Aortic valve regurgitation is trivial. Mild to moderate aortic valve sclerosis/calcification is present, without any evidence of aortic stenosis.  5. The inferior vena cava is normal in size with greater than 50% respiratory variability, suggesting right atrial pressure of 3 mmHg. FINDINGS  Left Ventricle: Left ventricular ejection fraction, by estimation, is 60 to 65%. The left ventricle has normal function. The left ventricle has no regional wall motion abnormalities. Definity contrast agent was given IV to delineate the left ventricular   endocardial borders. The left ventricular internal cavity size was normal in size. There is no left ventricular hypertrophy. Left ventricular diastolic parameters were normal. Right Ventricle: The right ventricular size is normal. No increase in right ventricular wall thickness. Right ventricular systolic function is normal. Tricuspid regurgitation signal is inadequate for assessing PA pressure. Left Atrium: Left atrial size was normal in size. Right Atrium: Right atrial size was normal in size. Pericardium: There is no evidence of pericardial effusion. Mitral Valve: The mitral valve is normal in structure. No evidence of mitral valve regurgitation. Tricuspid Valve: The tricuspid valve is normal in structure. Tricuspid valve regurgitation is trivial. Aortic Valve: The aortic valve was not well visualized. Aortic valve regurgitation is trivial. Mild to moderate aortic valve sclerosis/calcification is present, without any evidence of aortic stenosis. Pulmonic Valve: The pulmonic valve was not well visualized. Pulmonic valve regurgitation is trivial. Aorta: The aortic root and ascending aorta are structurally normal, with no evidence of dilitation. Venous: The inferior vena cava is normal in size with greater than 50% respiratory variability, suggesting right atrial pressure of 3 mmHg. IAS/Shunts: The interatrial septum was not well visualized.  LEFT VENTRICLE PLAX 2D LVIDd:         4.60 cm     Diastology LVIDs:         2.70 cm     LV e' medial:    7.72 cm/s LV PW:         0.90 cm     LV E/e' medial:  12.8 LV IVS:        1.00 cm     LV e' lateral:   10.10 cm/s LVOT diam:     2.00 cm     LV E/e' lateral: 9.8 LV SV:         68 LV SV Index:   33 LVOT Area:     3.14 cm  LV Volumes (MOD) LV vol d, MOD A2C: 50.7 ml LV vol d, MOD A4C: 89.5 ml LV vol s,  MOD A2C: 13.7 ml LV vol s, MOD A4C: 37.8 ml LV SV MOD A2C:     37.0 ml LV SV MOD A4C:     89.5 ml LV SV MOD BP:      44.3 ml RIGHT VENTRICLE         IVC TAPSE (M-mode): 1.7 cm   IVC diam: 1.70 cm LEFT ATRIUM             Index       RIGHT ATRIUM          Index LA diam:        2.90 cm 1.40 cm/m  RA Area:     9.24 cm LA Vol (A2C):   41.4 ml 20.00 ml/m RA Volume:   16.20 ml 7.83 ml/m LA Vol (A4C):   26.9 ml 13.00 ml/m LA Biplane Vol: 35.2 ml 17.01 ml/m  AORTIC VALVE LVOT Vmax:   123.00 cm/s LVOT Vmean:  78.600 cm/s LVOT VTI:    0.218 m  AORTA Ao Root diam: 3.60 cm Ao Asc diam:  3.60 cm MITRAL VALVE MV Area (PHT): 3.21 cm     SHUNTS MV Decel Time: 236 msec     Systemic VTI:  0.22 m MV E velocity: 99.10 cm/s   Systemic Diam: 2.00 cm MV A velocity: 125.00 cm/s MV E/A ratio:  0.79 Epifanio Lescheshristopher Schumann MD Electronically signed by Epifanio Lescheshristopher Schumann MD Signature Date/Time: 01/02/2021/11:49:42 AM    Final     Labs:  CBC: Recent Labs    03/10/20 1238 03/10/20 1244 03/11/20 0505 12/10/20 1547 01/01/21 1454  WBC 6.5  --  6.1 11.3* 11.8*  HGB 14.0 13.3 12.5* 13.6 14.2  HCT 42.4 39.0 38.3* 40.8 41.1  PLT 201  --  203 235 213    COAGS: Recent Labs    03/10/20 1238  INR 1.0  APTT 29    BMP: Recent Labs    03/10/20 1238 03/10/20 1244 03/11/20 0505 12/10/20 1547 01/01/21 1454  NA 138 139 138 138 134*  K 4.1 4.1 4.1 3.7 4.0  CL 106 105 105 104 103  CO2 20*  --  24 23 21*  GLUCOSE 166* 164* 187* 73 177*  BUN 13 14 15 20 17   CALCIUM 8.8*  --  8.9 9.2 9.0  CREATININE 1.48* 1.50* 1.42* 1.20 1.56*  GFRNONAA 47*  --  49* >60 47*    LIVER FUNCTION TESTS: Recent Labs    03/10/20 1238 03/11/20 0505 12/10/20 1547 01/01/21 1454  BILITOT 0.6 0.3 0.5 0.8  AST 27 18 12* 17  ALT 27 24 14 20   ALKPHOS 47 42 56 50  PROT 6.7 6.0* 6.7 6.8  ALBUMIN 3.9 3.4* 4.2 3.6    TUMOR MARKERS: No results for input(s): AFPTM, CEA, CA199, CHROMGRNA in the last 8760 hours.  Assessment and Plan: 70 y.o. male with previous stroke in 2020 s/p tPA, who is currently admitted for TIA/stroke work-up. MRA showed atherosclerotic change involving both carotid siphons with associated  moderate stenosis at the anterior genu of the cavernous left ICA.  A diagnostic cerebral arteriogram was recommended to the patient for the further evaluation by neurology.  After thorough discussion and shared decision making, patient decided to proceed with the arteriogram.  And IR was requested for diagnostic cervical and cerebral arteriogram. Case was reviewed and approved by Dr. Corliss Skainseveshwar.  The procedure is tentatively scheduled for tomorrow morning pending NIR schedule. Made n.p.o. at midnight BMP, CBC with differential, INR to be obtained tomorrow morning.  CMP yesterday showed mildly increased creatinine at 1.65, renal function unremarkable otherwise. VSS Patient was on Plavix 75 mg daily as an outpatient, ASA 81 mg added by neurology.  Risks and benefits of cerebral angiogram with intervention were discussed with the patient including, but not limited to bleeding, infection, vascular injury, contrast induced renal failure, stroke or even death.  This interventional procedure involves the use of X-rays and because of the nature of the planned procedure, it is possible that we will have prolonged use of X-ray fluoroscopy.  Potential radiation risks to you include (but are not limited to) the following: - A slightly elevated risk for cancer  several years later in life. This risk is typically less than 0.5% percent. This risk is low in comparison to the normal incidence of human cancer, which is 33% for women and 50% for men according to the [employer] Society. - Radiation induced injury can include skin redness, resembling a rash, tissue breakdown / ulcers and hair loss (which can be temporary or permanent).   The likelihood of either of these occurring depends on the difficulty of the procedure and whether you are sensitive to radiation due to previous procedures, disease, or genetic conditions.   IF your procedure requires a prolonged use of radiation, you will be notified and  given written instructions for further action.  It is your responsibility to monitor the irradiated area for the 2 weeks following the procedure and to notify your physician if you are concerned that you have suffered a radiation induced injury.    All of the patient's questions were answered, patient is agreeable to proceed.  Consent signed and in chart.   Thank you for this interesting consult.  I greatly enjoyed meeting Jeff [name] and look forward to participating in their care.  A copy of this report was sent to the requesting provider on this date.  Electronically Signed: Willette Brace, PA-C 01/02/2021, 2:09 PM   I spent a total of 40 Minutes   in face to face in clinical consultation, greater than 50% of which was counseling/coordinating care for diagnostic cervical and cerebral arteriogram.

## 2021-01-02 NOTE — Progress Notes (Signed)
STROKE TEAM PROGRESS NOTE   HISTORY OF PRESENT ILLNESS (per record) Jeff [name] is a 70 y.o. male [employer] history of diabetes, hypertension, hyperlipidemia, prior stroke with some residual right-sided weakness with some gait difficulty modified Rankin of 2, presented for evaluation of word finding difficulty, slurred speech and fall due to lower extremity weakness right worse than left. Last known well was sometime last night when he went to bed, but on waking up which he does not remember the exact time, there was diminished strength in his bilateral lower extremities and he had a fall while trying to get up.  His speech also was not making sense according to his wife.  His speech also sounded slurred. His symptoms are rapidly improving.  He did have some diminished sensation on the right side initially which also improved. He was admitted for a TIA/stroke work-up with exam nearly normal at the time of admission. Neurological consultation for TIA/stroke Currently on Plavix at home. Mostly sedentary lifestyle due to gait difficulty.  Was getting some outpatient therapy that stopped in December. He has been evaluated in October of this year for concern for staring spell-seizure versus stroke-symptoms attributed to hypoglycemic episode causing strokelike symptoms. CTA head and neck at that time with no emergent LVO and chronic right greater than left ICA siphon stenosis, left V4 occlusion and a new right P1 moderate stenosis otherwise stable intracranial greater than extracranial atherosclerosis. Prior stroke March 2020 punctate left medullary infarct status post tPA. Remains on statin LKW: 2100 hrs. on 12/31/2020 tpa given?: no, the window Premorbid modified Rankin scale (mRS): 2   INTERVAL HISTORY His wife is at the bedside.  Patient states that he had more of expressive aphasia and He went to speak but words did not come out speech was garbled as well as he was weak in all 4  extremities but more so than the right leg and admits to some weakness in the right hand as well.  MR angiogram of the brain shows severe bilateral carotid siphon stenosis but MRI of the brain shows no acute strokes.  LDL cholesterol 41 mg percent.  Hemoglobin A1c is elevated 8.6.  Echocardiogram is pending.    OBJECTIVE Vitals:   01/01/21 2219 01/01/21 2318 01/02/21 0205 01/02/21 0324  BP:  127/64 124/67 104/64  Pulse:  80 75 73  Resp:  16 15 17   Temp:  99.1 F (37.3 C) 98.7 F (37.1 C) 99.1 F (37.3 C)  TempSrc:  Oral Oral Oral  SpO2:  97% 96% 97%  Weight: 93.4 kg     Height: 5\' 8"  (1.727 m)       CBC:  Recent Labs  Lab 01/01/21 1454  WBC 11.8*  HGB 14.2  HCT 41.1  MCV 83.4  PLT 213    Basic Metabolic Panel:  Recent Labs  Lab 01/01/21 1454  NA 134*  K 4.0  CL 103  CO2 21*  GLUCOSE 177*  BUN 17  CREATININE 1.56*  CALCIUM 9.0    Lipid Panel:     Component Value Date/Time   CHOL 96 01/02/2021 0237   CHOL 226 (H) 12/30/2017 0846   TRIG 152 (H) 01/02/2021 0237   HDL 25 (L) 01/02/2021 0237   HDL 40 12/30/2017 0846   CHOLHDL 3.8 01/02/2021 0237   VLDL 30 01/02/2021 0237   LDLCALC 41 01/02/2021 0237   LDLCALC 150 (H) 12/30/2017 0846   HgbA1c:  Lab Results  Component Value Date  HGBA1C 8.6 (H) 01/02/2021   Urine Drug Screen: No results found for: LABOPIA, COCAINSCRNUR, LABBENZ, AMPHETMU, THCU, LABBARB  Alcohol Level No results found for: [facility]  IMAGING   CT Head Wo Contrast  Result Date: 01/01/2021 CLINICAL DATA:  Aphasia and weakness. EXAM: CT HEAD WITHOUT CONTRAST TECHNIQUE: Contiguous axial images were obtained from the base of the skull through the vertex without intravenous contrast. COMPARISON:  Brain CT 03/10/2020 FINDINGS: Brain: Ventricles and sulci are prominent compatible with atrophy. Periventricular and subcortical white matter hypodensities compatible with chronic microvascular ischemic changes. No evidence for acute cortically based  infarct, intracranial hemorrhage, mass lesion or mass-effect. Probable chronic lacunar infarct within the left basal ganglia. Vascular: Unremarkable Skull: Intact Sinuses/Orbits: Paranasal sinuses are well aerated. Mastoid air cells are unremarkable. Other: None. IMPRESSION: No acute intracranial process. Electronically Signed   By: Annia Belt M.D.   On: 01/01/2021 14:47   MR ANGIO HEAD WO CONTRAST  Result Date: 01/01/2021 CLINICAL DATA:  Initial evaluation for acute TIA. EXAM: MRA HEAD WITHOUT CONTRAST TECHNIQUE: Angiographic images of the Circle of Willis were acquired using MRA technique without intravenous contrast. COMPARISON:  Prior study from 03/10/2020. FINDINGS: Anterior circulation: Petrous segments patent bilaterally. Atheromatous change throughout the carotid siphons with associated moderate stenosis at the anterior genu of the cavernous left ICA (series 5, image 48). More mild multifocal narrowing of a noted about the right siphon. A1 segments patent bilaterally. Normal anterior communicating artery complex. Anterior cerebral arteries patent to their distal aspects without stenosis. No M1 stenosis or occlusion. Normal MCA bifurcations. Distal MCA branches perfused and symmetric. Posterior circulation: Neither vertebral artery well assessed on this exam. Basilar patent to its distal aspect without stenosis. Superior cerebral arteries patent bilaterally. Both PCAs primarily supplied via the basilar. Left PCA mildly irregular but well perfused to its distal aspect without stenosis. Moderate stenosis seen about the right P1 segment (series 1037, image 1). Right PCA irregular but otherwise patent to its distal aspect. Anatomic variants: None significant. Other: No aneurysm. IMPRESSION: 1. Negative intracranial MRA for large vessel occlusion. 2. Atherosclerotic change involving both carotid siphons with associated moderate stenosis at the anterior genu of the cavernous left ICA. 3. Moderate stenosis  about the right P1 segment. Electronically Signed   By: Rise Mu M.D.   On: 01/01/2021 22:10   MR BRAIN WO CONTRAST  Result Date: 01/01/2021 CLINICAL DATA:  Transient ischemic attack. Additional history provided: Worsening right-sided leg weakness, fall, difficulty with speech. EXAM: MRI HEAD WITHOUT CONTRAST TECHNIQUE: Multiplanar, multiecho pulse sequences of the brain and surrounding structures were obtained without intravenous contrast. COMPARISON:  Prior head CT examinations 01/01/2021 and earlier. Brain MRI 03/10/2020. CT angiogram head/neck 03/10/2020. FINDINGS: Brain: Moderate central predominant cerebral atrophy. Moderate multifocal T2/FLAIR hyperintensity within the cerebral white matter and pons, nonspecific but compatible with chronic small vessel ischemic disease. Redemonstrated chronic lacunar infarct within the right basal ganglia. There is no acute infarct. No evidence of an intracranial mass. No chronic intracranial blood products. No extra-axial fluid collection. No midline shift. Vascular: Unchanged signal abnormality within the V4 left vertebral artery compatible with known chronic occlusion of this vessel. Flow voids are otherwise maintained within the proximal large arterial vessels. Skull and upper cervical spine: No focal marrow lesion. Sinuses/Orbits: Visualized orbits show no acute finding. Trace bilateral ethmoid, sphenoid and maxillary sinus mucosal thickening. IMPRESSION: No evidence of acute intracranial abnormality. Redemonstrated chronic lacunar infarct within the right basal ganglia. Moderate chronic small vessel ischemic changes within the cerebral  white matter and pons, stable as compared to the brain MRI of 03/10/2020. Moderate central predominant cerebral atrophy, stable. 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 Minimal paranasal sinus disease, as described. Electronically Signed   By: Jackey Loge DO   On: 01/01/2021 19:10   DG Chest Port 1  View  Result Date: 01/01/2021 CLINICAL DATA:  Weakness.  TIA. EXAM: PORTABLE CHEST 1 VIEW COMPARISON:  May 30, 2011 FINDINGS: The heart size and mediastinal contours are within normal limits. Both lungs are clear. The visualized skeletal structures are unremarkable. IMPRESSION: No active disease. Electronically Signed   By: Gerome Sam III M.D   On: 01/01/2021 15:11     Transthoracic Echocardiogram  00/00/2021 Pending  Bilateral Carotid Dopplers  00/00/2021 Pending  ECG - SR rate 84 BPM. (See cardiology reading for complete details)  PHYSICAL EXAM Blood pressure 104/64, pulse 73, temperature 99.1 F (37.3 C), temperature source Oral, resp. rate 17, height 5\' 8"  (1.727 m), weight 93.4 kg, SpO2 97 %. Pleasant elderly Caucasian male not in distress. . Afebrile. Head is nontraumatic. Neck is supple without bruit.    Cardiac exam no murmur or gallop. Lungs are clear to auscultation. Distal pulses are well felt.  Neurological Exam ;  Awake  Alert oriented x 3. Normal speech and language.eye movements full without nystagmus.fundi were not visualized. Vision acuity and fields appear normal. Hearing is normal. Palatal movements are normal. Face symmetric. Tongue midline. Normal strength, tone, reflexes and coordination. Normal sensation. Gait deferred.   ASSESSMENT/PLAN [name] is a 70 y.o. male with history of diabetes, hypertension, hyperlipidemia, ADHD, renal disorder, prior stroke with some residual right-sided weakness with some gait difficulty and evaluation in October of this year for concern for staring spell-seizure versus stroke-symptoms attributed to hypoglycemic episode causing strokelike symptoms presented with word finding difficulty, slurred speech and fall due to lower extremity weakness right worse than left. He did not receive IV t-PA due to resolution of deficits.   TIA: word finding difficulty, slurred speech and fall due to lower extremity weakness right worse  than left - resolved. MRI head - No evidence of acute intracranial abnormality.  Resultant no deficits Code Stroke CT Head - not ordered CT head - No acute intracranial process. MRI head - No evidence of acute intracranial abnormality. Re-demonstrated chronic lacunar infarct within the right basal ganglia. Moderate chronic small vessel ischemic changes within the cerebral white matter and pons, stable as compared to the brain MRI of 03/10/2020. Moderate central predominant cerebral atrophy, stable. 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 MRA head - Negative intracranial MRA for large vessel occlusion. Atherosclerotic change involving both carotid siphons with associated moderate stenosis at the anterior genu of the cavernous left ICA. Moderate stenosis about the right P1 segment. CTA H&N - not ordered CT Perfusion - not ordered Carotid Doppler - pending 2D Echo - pending Sars Corona Virus 2 - negative LDL - 41 HgbA1c - 8.6 UDS - not ordered VTE prophylaxis - Lovenox 40 mg daily Diet  Diet Order             Diet Carb Modified Fluid consistency: Thin; Room service appropriate? Yes  Diet effective now                  clopidogrel 75 mg daily prior to admission, now on aspirin 325 mg daily and clopidogrel 75 mg daily Patient will be counseled to be compliant with his antithrombotic medications Ongoing  aggressive stroke risk factor management Therapy recommendations:  pending Disposition:  Pending  Hypertension Home BP meds: Lisinopril Current BP meds: none  Stable Permissive hypertension (OK if < 220/120) but gradually normalize in 5-7 days Long-term BP goal normotensive  Hyperlipidemia Home Lipid lowering medication: Crestor  40 mg daily LDL 41,  goal < 70 Current lipid lowering medication: Crestor 40 mg daily Continue statin at discharge  Diabetes Home diabetic meds: insulin and metformin Current diabetic meds: insulin HgbA1c 8.6, goal < 7.0 Recent  Labs    01/01/21 1518 01/01/21 2210 01/02/21 0609  GLUCAP 180* 270* 194*    Other Stroke Risk Factors Advanced age Obesity, Body mass index is 31.32 kg/m., recommend weight loss, diet and exercise as appropriate  Hx stroke/TIA  Other Active Problems, Findings, Recommendations and/or Plan Code status - Full code  CKD - stage 3a - creatinine - 1.56  Mild leukocytosis - WBC's - 11.8  (temp - 99.1)  Patient presented with transient speech difficulties and bilateral lower extremity weakness right greater than left and MR angiogram as well as previous CT angiogram from last admission for stroke shows severe carotid siphon stenosis which may be symptomatic.  Recommend further evaluation by checking diagnostic cerebral catheter angiogram and possible consideration for left carotid stenting if necessary.  Recommend aspirin and Brilinta for 4 weeks followed by aspirin alone and aggressive risk factor modification.  Long discussion with patient and Dr. Zenaida Niece, Dr. Corliss Skains and answered questions.  Greater than 50% time during this 35-minute visit was spent in counseling and coordination of care and discussion with care team.   Hospital day # 0  Delia Heady, MD  To contact Stroke Continuity provider, please refer to [email or web address]. After hours, contact General Neurology

## 2021-01-02 NOTE — Progress Notes (Signed)
PT Cancellation Note  Patient Details Name: TARRIS DELBENE MRN: [id number] DOB: 1950-10-06   Cancelled Treatment:    Reason Eval/Treat Not Completed: Patient at procedure or test/unavailable - bedside US in progress, PT To check back.  Marye Round, PT DPT Acute Rehabilitation Services Pager [phone]  Office [phone]    Truddie Coco 01/02/2021, 9:39 AM

## 2021-01-02 NOTE — Progress Notes (Signed)
OT Cancellation Note  Patient Details Name: Jeff [name] MRN: [id number] DOB: 1951/01/29   Cancelled Treatment:    Reason Eval/Treat Not Completed: Patient at procedure or test/ unavailable (Eating breakfast. Asked to return. Will follow up later time.)  [facility], OT/L   Acute OT Clinical Specialist Acute Rehabilitation Services Pager [phone] Office [phone]  01/02/2021, 8:33 AM

## 2021-01-02 NOTE — Progress Notes (Signed)
SLP Cancellation Note  Patient Details Name: COLBI STAUBS MRN: [id number] DOB: 03-01-1951   Cancelled treatment:       Reason Eval/Treat Not Completed: Patient at procedure or test/unavailable. Transport on their way to take pt to test/procedure per RN. Will continue efforts.  Avie Echevaria, MA, CCC-SLP Acute Rehabilitation Services Office Number: [phone]  Paulette Blanch 01/02/2021, 3:47 PM

## 2021-01-02 NOTE — Evaluation (Signed)
Physical Therapy Evaluation Patient Details Name: Jeff [name] MRN: [id number] DOB: 02/25/1951 Today's Date: 01/02/2021   History of Present Illness  70 yo male presents to [facility] on 7/31 for aphasia, increasing R weakness with fall, altered sensation LEs. CTH negative for acute findings, workup for TIA. PMH includes CVA with residual mild R weakness, HLD, DM,HTN, retinopathy, CKD.  Clinical Impression  Pt presents with functional weakness R>L, impaired higher level balance as evidenced by DGI 18/24, increased time and effort to mobilize, and decreased activity tolerance. Pt to benefit from acute PT to address deficits. Pt ambulated hallway distance without AD, but lists R with little to no awareness and demonstrates noticeable balance deficits when challenged. Pt states he finished with OPPT recently, PT feels pt would benefit from continued PT for balance and functional strengthening. PT to progress mobility as tolerated, and will continue to follow acutely.      Follow Up Recommendations Supervision for mobility/OOB;Outpatient PT (balance; vs no follow up pending pt progression)    Equipment Recommendations  None recommended by PT    Recommendations for Other Services       Precautions / Restrictions Precautions Precautions: Fall Restrictions Weight Bearing Restrictions: No      Mobility  Bed Mobility Overal bed mobility: Modified Independent             General bed mobility comments: up in chair upon PT arrival to room    Transfers Overall transfer level: Needs assistance Equipment used: None Transfers: Sit to/from Stand Sit to Stand: Supervision         General transfer comment: for safety  Ambulation/Gait Ambulation/Gait assistance: Min guard Gait Distance (Feet): 310 Feet Assistive device: None Gait Pattern/deviations: Step-through pattern;Decreased stride length;Shuffle;Decreased dorsiflexion - right;Staggering right Gait velocity: decr   General Gait  Details: close guard for safety, verbal cuing for increasing foot clearance RLE during gait, maintaining straight trajectory as pt listing R throughout session and bumped into x2 hallway objects. Imbalance with challenges to dynamic balance (see balance section).  Stairs Stairs: Yes Stairs assistance: Min guard Stair Management: Alternating pattern;Forwards Number of Stairs: 2 General stair comments: close guard for safety, per pt his wife typically helps him with steps as needed  Wheelchair Mobility    Modified Rankin (Stroke Patients Only) Modified Rankin (Stroke Patients Only) Pre-Morbid Rankin Score: Moderate disability Modified Rankin: Moderately severe disability     Balance Overall balance assessment: Needs assistance Sitting-balance support: No upper extremity supported Sitting balance-Leahy Scale: Good     Standing balance support: No upper extremity supported Standing balance-Leahy Scale: Good                   Standardized Balance Assessment Standardized Balance Assessment : Dynamic Gait Index   Dynamic Gait Index Level Surface: Normal Change in Gait Speed: Normal Gait with Horizontal Head Turns: Moderate Impairment Gait with Vertical Head Turns: Moderate Impairment Gait and Pivot Turn: Mild Impairment Step Over Obstacle: Mild Impairment Step Around Obstacles: Normal Steps: Normal Total Score: 18       Pertinent Vitals/Pain Pain Assessment: Faces Faces Pain Scale: No hurt Pain Intervention(s): Monitored during session    Home Living Family/patient expects to be discharged to:: Private residence Living Arrangements: Spouse/significant other Available Help at Discharge: Family;Available 24 hours/day Type of Home: House Home Access: Stairs to enter;Ramped entrance   Entrance Stairs-Number of Steps: 3 Home Layout: One level Home Equipment: Walker - 2 wheels;Cane - single point;Bedside commode;Shower seat      Prior  Function Level of  Independence: Independent         Comments: Recently stopped using cane in June     Hand Dominance   Dominant Hand: Right    Extremity/Trunk Assessment   Upper Extremity Assessment Upper Extremity Assessment: Defer to OT evaluation RUE Deficits / Details: residual R sided weakness but functional    Lower Extremity Assessment Lower Extremity Assessment: Overall WFL for tasks assessed (5/5 RLE as assessed by MMT, functional weakness during gait demonstrated in decreased R foot clearance during gait)    Cervical / Trunk Assessment Cervical / Trunk Assessment: Normal  Communication   Communication: HOH  Cognition Arousal/Alertness: Awake/alert Behavior During Therapy: Impulsive Overall Cognitive Status: History of cognitive impairments - at baseline                                 General Comments: Pt lacks insight into deficits, moves quickly and haphazardly at times. Pt requires cues for safety throughout session      General Comments      Exercises     Assessment/Plan    PT Assessment Patient needs continued PT services  PT Problem List Decreased strength;Decreased mobility;Decreased safety awareness;Decreased activity tolerance;Decreased balance       PT Treatment Interventions Therapeutic activities;Gait training;Therapeutic exercise;Patient/family education;Stair training;Balance training;Functional mobility training;Neuromuscular re-education    PT Goals (Current goals can be found in the Care Plan section)  Acute Rehab PT Goals Patient Stated Goal: to go home PT Goal Formulation: With patient/family Time For Goal Achievement: 01/16/21 Potential to Achieve Goals: Good    Frequency Min 4X/week   Barriers to discharge        Co-evaluation               AM-PAC PT "6 Clicks" Mobility  Outcome Measure Help needed turning from your back to your side while in a flat bed without using bedrails?: None Help needed moving from lying on  your back to sitting on the side of a flat bed without using bedrails?: None Help needed moving to and from a bed to a chair (including a wheelchair)?: A Little Help needed standing up from a chair using your arms (e.g., wheelchair or bedside chair)?: A Little Help needed to walk in hospital room?: A Little Help needed climbing 3-5 steps with a railing? : A Little 6 Click Score: 20    End of Session   Activity Tolerance: Patient tolerated treatment well Patient left: in chair;with call bell/phone within reach;with chair alarm set;with family/visitor present Nurse Communication: Mobility status PT Visit Diagnosis: Other abnormalities of gait and mobility (R26.89);Other symptoms and signs involving the nervous system (R29.898)    Time: 4010-2725 PT Time Calculation (min) (ACUTE ONLY): 25 min   Charges:   PT Evaluation $PT Eval Low Complexity: 1 Low PT Treatments $Gait Training: 8-22 mins       Marye Round, PT DPT Acute Rehabilitation Services Pager [phone]  Office [phone]   Jeff [name] 01/02/2021, 12:10 PM

## 2021-01-02 NOTE — Progress Notes (Signed)
Carotid duplex bilateral study completed.   Please see CV Proc for preliminary results.   Jeyren Danowski, RDMS, RVT  

## 2021-01-02 NOTE — Progress Notes (Signed)
Progress Note    Jeff [name]  ZOX:[id number] DOB: Oct 02, 1950  DOA: 01/01/2021 PCP: Anson Fret, MD    Brief Narrative:     Medical records reviewed and are as summarized below:  Jeff [name] is an 70 y.o. male patient with hx diabetes, hyperlipidemia, hypertension and prior stroke with right leg weakness who presents with worsening right leg weakness, fall and difficulty with speech.  Patient was relatively doing well at around 8 PM when he went to bed last night.  This morning, he woke up late and when he got out to go to the bathroom, he felt weak on his right leg and fell.  He crawled back to his bed and waited until his wife returned from church.  Per neurology  Patient presented with transient speech difficulties and bilateral lower extremity weakness right greater than left and MR angiogram as well as previous CT angiogram from last admission for stroke shows severe carotid siphon stenosis which may be symptomatic.  Recommend further evaluation by checking diagnostic cerebral catheter angiogram and possible consideration for left carotid stenting if necessary.  Recommend aspirin and Brilinta for 4 weeks followed by aspirin alone and aggressive risk factor modification.    Assessment/Plan:   Active Problems:   TIA (transient ischemic attack)  TIA -neurology consult Patient presented with transient speech difficulties and bilateral lower extremity weakness right greater than left and MR angiogram as well as previous CT angiogram from last admission for stroke shows severe carotid siphon stenosis which may be symptomatic.  Recommend further evaluation by checking diagnostic cerebral catheter angiogram and possible consideration for left carotid stenting if necessary.  Recommend aspirin and Brilinta for 4 weeks followed by aspirin alone and aggressive risk factor modification.     Diabetes Resume insulin glargine at a lower dose.   -SSI scale with short acting insulin  coverage.  CKD 3A -daily labs -limit nephrotoxic agents  HTN -resume home meds  -after permissive HTN goal is normal BP  HLS -LDL 41 -continue home statin  obesity Body mass index is 31.32 kg/m.   Family Communication/Anticipated D/C date and plan/Code Status   DVT prophylaxis: Lovenox ordered. Code Status: Full Code.  Disposition Plan: Status is: Observation  The patient will require care spanning > 2 midnights and should be moved to inpatient because: Inpatient level of care appropriate due to severity of illness  Dispo: The patient is from: Home              Anticipated d/c is to: Home              Patient currently is not medically stable to d/c.   Difficult to place patient No         Medical Consultants:   Neurology IR    Subjective:  In bed getting echo  Objective:    Vitals:   01/02/21 0324 01/02/21 0836 01/02/21 1218 01/02/21 1220  BP: 104/64 116/67 116/66 116/66  Pulse: 73 71 75 75  Resp: Temp: 99.1 F (37.3 C) 98.4 F (36.9 C) (!) 97.4 F (36.3 C) (!) 97.4 F (36.3 C)  TempSrc: Oral Oral Oral Oral  SpO2: 97% 99% 98% 98%  Weight:      Height:        Intake/Output Summary (Last 24 hours) at 01/02/2021 1449 Last data filed at 01/02/2021 0400 Gross per 24 hour  Intake 672.74 ml  Output --  Net 672.74 ml   [employer]  01/01/21 2219  Weight: 93.4 kg    Exam: In bed, NAD Rrr No increased work of breathing  Data Reviewed:   I have personally reviewed following labs and imaging studies:  Labs: Labs show the following:   Basic Metabolic Panel: Recent Labs  Lab 01/01/21 1454  NA 134*  K 4.0  CL 103  CO2 21*  GLUCOSE 177*  BUN 17  CREATININE 1.56*  CALCIUM 9.0   GFR Estimated Creatinine Clearance: 47.5 mL/min (A) (by C-G formula based on SCr of 1.56 mg/dL (H)). Liver Function Tests: Recent Labs  Lab 01/01/21 1454  AST 17  ALT 20  ALKPHOS 50  BILITOT 0.8  PROT 6.8  ALBUMIN 3.6   No results  for input(s): LIPASE, AMYLASE in the last 168 hours. No results for input(s): AMMONIA in the last 168 hours. Coagulation profile No results for input(s): INR, PROTIME in the last 168 hours.  CBC: Recent Labs  Lab 01/01/21 1454  WBC 11.8*  HGB 14.2  HCT 41.1  MCV 83.4  PLT 213   Cardiac Enzymes: No results for input(s): CKTOTAL, CKMB, CKMBINDEX, TROPONINI in the last 168 hours. BNP (last 3 results) No results for input(s): PROBNP in the last 8760 hours. CBG: Recent Labs  Lab 01/01/21 1518 01/01/21 2210 01/02/21 0609 01/02/21 1211  GLUCAP 180* 270* 194* 258*   D-Dimer: No results for input(s): DDIMER in the last 72 hours. Hgb A1c: Recent Labs    01/02/21 0237  HGBA1C 8.6*   Lipid Profile: Recent Labs    01/02/21 0237  CHOL 96  HDL 25*  LDLCALC 41  TRIG 242*  CHOLHDL 3.8   Thyroid function studies: No results for input(s): TSH, T4TOTAL, T3FREE, THYROIDAB in the last 72 hours.  Invalid input(s): FREET3 Anemia work up: No results for input(s): VITAMINB12, FOLATE, FERRITIN, TIBC, IRON, RETICCTPCT in the last 72 hours. Sepsis Labs: Recent Labs  Lab 01/01/21 1454  WBC 11.8*    Microbiology Recent Results (from the past 240 hour(s))  Resp Panel by RT-PCR (Flu A&B, Covid) Nasopharyngeal Swab     Status: None   Collection Time: 01/01/21  4:53 PM   Specimen: Nasopharyngeal Swab; Nasopharyngeal(NP) swabs in vial transport medium  Result Value Ref Range Status   SARS Coronavirus 2 by RT PCR NEGATIVE NEGATIVE Final    Comment: (NOTE) SARS-CoV-2 target nucleic acids are NOT DETECTED.  The SARS-CoV-2 RNA is generally detectable in upper respiratory specimens during the acute phase of infection. The lowest concentration of SARS-CoV-2 viral copies this assay can detect is 138 copies/mL. A negative result does not preclude SARS-Cov-2 infection and should not be used as the sole basis for treatment or other patient management decisions. A negative result may occur  with  improper specimen collection/handling, submission of specimen other than nasopharyngeal swab, presence of viral mutation(s) within the areas targeted by this assay, and inadequate number of viral copies(<138 copies/mL). A negative result must be combined with clinical observations, patient history, and epidemiological information. The expected result is Negative.  Fact Sheet for Patients:  BloggerCourse.com  Fact Sheet for Healthcare Providers:  SeriousBroker.it  This test is no t yet approved or cleared by the [place] FDA and  has been authorized for detection and/or diagnosis of SARS-CoV-2 by FDA under an Emergency Use Authorization (EUA). This EUA will remain  in effect (meaning this test can be used) for the duration of the COVID-19 declaration under Section 564(b)(1) of the Act, 21 U.S.C.section 360bbb-3(b)(1), unless the authorization is terminated  or revoked sooner.       Influenza A by PCR NEGATIVE NEGATIVE Final   Influenza B by PCR NEGATIVE NEGATIVE Final    Comment: (NOTE) The Xpert Xpress SARS-CoV-2/FLU/RSV plus assay is intended as an aid in the diagnosis of influenza from Nasopharyngeal swab specimens and should not be used as a sole basis for treatment. Nasal washings and aspirates are unacceptable for Xpert Xpress SARS-CoV-2/FLU/RSV testing.  Fact Sheet for Patients: BloggerCourse.com  Fact Sheet for Healthcare Providers: SeriousBroker.it  This test is not yet approved or cleared by the [place] FDA and has been authorized for detection and/or diagnosis of SARS-CoV-2 by FDA under an Emergency Use Authorization (EUA). This EUA will remain in effect (meaning this test can be used) for the duration of the COVID-19 declaration under Section 564(b)(1) of the Act, 21 U.S.C. section 360bbb-3(b)(1), unless the authorization is terminated  or revoked.  Performed at [facility], [address] [place], [place] [address]     Procedures and diagnostic studies:  CT Head Wo Contrast  Result Date: 01/01/2021 CLINICAL DATA:  Aphasia and weakness. EXAM: CT HEAD WITHOUT CONTRAST TECHNIQUE: Contiguous axial images were obtained from the base of the skull through the vertex without intravenous contrast. COMPARISON:  Brain CT 03/10/2020 FINDINGS: Brain: Ventricles and sulci are prominent compatible with atrophy. Periventricular and subcortical white matter hypodensities compatible with chronic microvascular ischemic changes. No evidence for acute cortically based infarct, intracranial hemorrhage, mass lesion or mass-effect. Probable chronic lacunar infarct within the left basal ganglia. Vascular: Unremarkable Skull: Intact Sinuses/Orbits: Paranasal sinuses are well aerated. Mastoid air cells are unremarkable. Other: None. IMPRESSION: No acute intracranial process. Electronically Signed   By: Annia Belt M.D.   On: 01/01/2021 14:47   MR ANGIO HEAD WO CONTRAST  Result Date: 01/01/2021 CLINICAL DATA:  Initial evaluation for acute TIA. EXAM: MRA HEAD WITHOUT CONTRAST TECHNIQUE: Angiographic images of the Circle of Willis were acquired using MRA technique without intravenous contrast. COMPARISON:  Prior study from 03/10/2020. FINDINGS: Anterior circulation: Petrous segments patent bilaterally. Atheromatous change throughout the carotid siphons with associated moderate stenosis at the anterior genu of the cavernous left ICA (series 5, image 48). More mild multifocal narrowing of a noted about the right siphon. A1 segments patent bilaterally. Normal anterior communicating artery complex. Anterior cerebral arteries patent to their distal aspects without stenosis. No M1 stenosis or occlusion. Normal MCA bifurcations. Distal MCA branches perfused and symmetric. Posterior circulation: Neither vertebral artery well assessed on this exam. Basilar  patent to its distal aspect without stenosis. Superior cerebral arteries patent bilaterally. Both PCAs primarily supplied via the basilar. Left PCA mildly irregular but well perfused to its distal aspect without stenosis. Moderate stenosis seen about the right P1 segment (series 1037, image 1). Right PCA irregular but otherwise patent to its distal aspect. Anatomic variants: None significant. Other: No aneurysm. IMPRESSION: 1. Negative intracranial MRA for large vessel occlusion. 2. Atherosclerotic change involving both carotid siphons with associated moderate stenosis at the anterior genu of the cavernous left ICA. 3. Moderate stenosis about the right P1 segment. Electronically Signed   By: Rise Mu M.D.   On: 01/01/2021 22:10   MR BRAIN WO CONTRAST  Result Date: 01/01/2021 CLINICAL DATA:  Transient ischemic attack. Additional history provided: Worsening right-sided leg weakness, fall, difficulty with speech. EXAM: MRI HEAD WITHOUT CONTRAST TECHNIQUE: Multiplanar, multiecho pulse sequences of the brain and surrounding structures were obtained without intravenous contrast. COMPARISON:  Prior head CT examinations 01/01/2021 and earlier.  Brain MRI 03/10/2020. CT angiogram head/neck 03/10/2020. FINDINGS: Brain: Moderate central predominant cerebral atrophy. Moderate multifocal T2/FLAIR hyperintensity within the cerebral white matter and pons, nonspecific but compatible with chronic small vessel ischemic disease. Redemonstrated chronic lacunar infarct within the right basal ganglia. There is no acute infarct. No evidence of an intracranial mass. No chronic intracranial blood products. No extra-axial fluid collection. No midline shift. Vascular: Unchanged signal abnormality within the V4 left vertebral artery compatible with known chronic occlusion of this vessel. Flow voids are otherwise maintained within the proximal large arterial vessels. Skull and upper cervical spine: No focal marrow lesion.  Sinuses/Orbits: Visualized orbits show no acute finding. Trace bilateral ethmoid, sphenoid and maxillary sinus mucosal thickening. IMPRESSION: No evidence of acute intracranial abnormality. Redemonstrated chronic lacunar infarct within the right basal ganglia. Moderate chronic small vessel ischemic changes within the cerebral white matter and pons, stable as compared to the brain MRI of 03/10/2020. Moderate central predominant cerebral atrophy, stable. 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 Minimal paranasal sinus disease, as described. Electronically Signed   By: Jackey LogeKyle  Golden DO   On: 01/01/2021 19:10   DG Chest Port 1 View  Result Date: 01/01/2021 CLINICAL DATA:  Weakness.  TIA. EXAM: PORTABLE CHEST 1 VIEW COMPARISON:  May 30, 2011 FINDINGS: The heart size and mediastinal contours are within normal limits. Both lungs are clear. The visualized skeletal structures are unremarkable. IMPRESSION: No active disease. Electronically Signed   By: Gerome Samavid  Williams III M.D   On: 01/01/2021 15:11   ECHOCARDIOGRAM COMPLETE  Result Date: 01/02/2021    ECHOCARDIOGRAM REPORT   Patient Name:   Leilani AbleJAY W Garate Date of Exam: 01/02/2021 Medical Rec #:  [id number]  Height:       68.0 in Accession #:    [id number] Weight:       206.0 lb Date of Birth:  10-06-1950  BSA:          2.070 m Patient Age:    70 years   BP:           116/67 mmHg Patient Gender: M          HR:           75 bpm. Exam Location:  Inpatient Procedure: 2D Echo, 3D Echo, Intracardiac Opacification Agent, Cardiac Doppler            and Color Doppler Indications:    TIA  History:        Patient has prior history of Echocardiogram examinations, most                 recent 03/10/2020. TIA and Stroke; Risk Factors:Dyslipidemia and                 Diabetes.  Sonographer:    Sheralyn Boatmanina West RDCS Referring Phys: [id number] [facility] GHEDAMU [facility] IncBRAHA  Sonographer Comments: Technically difficult study due to poor echo windows. Image acquisition challenging due to  patient body habitus. IMPRESSIONS  1. Left ventricular ejection fraction, by estimation, is 60 to 65%. The left ventricle has normal function. The left ventricle has no regional wall motion abnormalities. Left ventricular diastolic parameters were normal.  2. Right ventricular systolic function is normal. The right ventricular size is normal. Tricuspid regurgitation signal is inadequate for assessing PA pressure.  3. The mitral valve is normal in structure. No evidence of mitral valve regurgitation.  4. The aortic valve was not well visualized. Aortic valve regurgitation is trivial. Mild to moderate aortic valve sclerosis/calcification is  present, without any evidence of aortic stenosis.  5. The inferior vena cava is normal in size with greater than 50% respiratory variability, suggesting right atrial pressure of 3 mmHg. FINDINGS  Left Ventricle: Left ventricular ejection fraction, by estimation, is 60 to 65%. The left ventricle has normal function. The left ventricle has no regional wall motion abnormalities. Definity contrast agent was given IV to delineate the left ventricular  endocardial borders. The left ventricular internal cavity size was normal in size. There is no left ventricular hypertrophy. Left ventricular diastolic parameters were normal. Right Ventricle: The right ventricular size is normal. No increase in right ventricular wall thickness. Right ventricular systolic function is normal. Tricuspid regurgitation signal is inadequate for assessing PA pressure. Left Atrium: Left atrial size was normal in size. Right Atrium: Right atrial size was normal in size. Pericardium: There is no evidence of pericardial effusion. Mitral Valve: The mitral valve is normal in structure. No evidence of mitral valve regurgitation. Tricuspid Valve: The tricuspid valve is normal in structure. Tricuspid valve regurgitation is trivial. Aortic Valve: The aortic valve was not well visualized. Aortic valve regurgitation is  trivial. Mild to moderate aortic valve sclerosis/calcification is present, without any evidence of aortic stenosis. Pulmonic Valve: The pulmonic valve was not well visualized. Pulmonic valve regurgitation is trivial. Aorta: The aortic root and ascending aorta are structurally normal, with no evidence of dilitation. Venous: The inferior vena cava is normal in size with greater than 50% respiratory variability, suggesting right atrial pressure of 3 mmHg. IAS/Shunts: The interatrial septum was not well visualized.  LEFT VENTRICLE PLAX 2D LVIDd:         4.60 cm     Diastology LVIDs:         2.70 cm     LV e' medial:    7.72 cm/s LV PW:         0.90 cm     LV E/e' medial:  12.8 LV IVS:        1.00 cm     LV e' lateral:   10.10 cm/s LVOT diam:     2.00 cm     LV E/e' lateral: 9.8 LV SV:         68 LV SV Index:   33 LVOT Area:     3.14 cm  LV Volumes (MOD) LV vol d, MOD A2C: 50.7 ml LV vol d, MOD A4C: 89.5 ml LV vol s, MOD A2C: 13.7 ml LV vol s, MOD A4C: 37.8 ml LV SV MOD A2C:     37.0 ml LV SV MOD A4C:     89.5 ml LV SV MOD BP:      44.3 ml RIGHT VENTRICLE         IVC TAPSE (M-mode): 1.7 cm  IVC diam: 1.70 cm LEFT ATRIUM             Index       RIGHT ATRIUM          Index LA diam:        2.90 cm 1.40 cm/m  RA Area:     9.24 cm LA Vol (A2C):   41.4 ml 20.00 ml/m RA Volume:   16.20 ml 7.83 ml/m LA Vol (A4C):   26.9 ml 13.00 ml/m LA Biplane Vol: 35.2 ml 17.01 ml/m  AORTIC VALVE LVOT Vmax:   123.00 cm/s LVOT Vmean:  78.600 cm/s LVOT VTI:    0.218 m  AORTA Ao Root diam: 3.60 cm Ao Asc diam:  3.60  cm MITRAL VALVE MV Area (PHT): 3.21 cm     SHUNTS MV Decel Time: 236 msec     Systemic VTI:  0.22 m MV E velocity: 99.10 cm/s   Systemic Diam: 2.00 cm MV A velocity: 125.00 cm/s MV E/A ratio:  0.79 Epifanio Lesches MD Electronically signed by Epifanio Lesches MD Signature Date/Time: 01/02/2021/11:49:42 AM    Final     Medications:     stroke: mapping our early stages of recovery book   Does not apply Once   aspirin  EC  81 mg Oral Daily   enoxaparin (LOVENOX) injection  40 mg Subcutaneous Q24H   insulin aspart  0-15 Units Subcutaneous TID WC   insulin glargine-yfgn  30 Units Subcutaneous BID   levothyroxine  50 mcg Oral Q0600   pantoprazole  40 mg Oral QHS   rosuvastatin  40 mg Oral Daily   Continuous Infusions:  sodium chloride 75 mL/hr at 01/01/21 2037     LOS: 0 days   Joseph Art  [facility]   How to contact the [facility] Attending or Consulting provider 7A - 7P or covering provider during after hours 7P -7A, for this patient?  Check the care team in [facility] and look for a) attending/consulting [facility] provider listed and b) the [facility] team listed Log into [email or web address] and use 's universal password to access. If you do not have the password, please contact the hospital operator. Locate the [facility] provider you are looking for under [facility] and page to a number that you can be directly reached. If you still have difficulty reaching the provider, please page the [facility] (Director on Call) for the Hospitalists listed on amion for assistance.  01/02/2021, 2:49 PM

## 2021-01-03 ENCOUNTER — Other Ambulatory Visit (HOSPITAL_COMMUNITY): Payer: Self-pay

## 2021-01-03 ENCOUNTER — Observation Stay (HOSPITAL_COMMUNITY): Payer: No Typology Code available for payment source

## 2021-01-03 DIAGNOSIS — G459 Transient cerebral ischemic attack, unspecified: Secondary | ICD-10-CM | POA: Diagnosis not present

## 2021-01-03 DIAGNOSIS — E119 Type 2 diabetes mellitus without complications: Secondary | ICD-10-CM | POA: Diagnosis not present

## 2021-01-03 DIAGNOSIS — Z794 Long term (current) use of insulin: Secondary | ICD-10-CM

## 2021-01-03 HISTORY — PX: IR ANGIO VERTEBRAL SEL SUBCLAVIAN INNOMINATE UNI L MOD SED: IMG5364

## 2021-01-03 HISTORY — PX: IR US GUIDE VASC ACCESS RIGHT: IMG2390

## 2021-01-03 HISTORY — PX: IR ANGIO INTRA EXTRACRAN SEL COM CAROTID INNOMINATE BILAT MOD SED: IMG5360

## 2021-01-03 HISTORY — PX: IR ANGIO VERTEBRAL SEL VERTEBRAL UNI R MOD SED: IMG5368

## 2021-01-03 LAB — GLUCOSE, CAPILLARY
Glucose-Capillary: 155 mg/dL — ABNORMAL HIGH (ref 70–99)
Glucose-Capillary: 125 mg/dL — ABNORMAL HIGH (ref 70–99)

## 2021-01-03 LAB — CBC WITH DIFFERENTIAL/PLATELET
Abs Immature Granulocytes: 0.02 10*3/uL (ref 0.00–0.07)
Basophils Absolute: 0 10*3/uL (ref 0.0–0.1)
Basophils Relative: 0 %
Eosinophils Absolute: 0.3 10*3/uL (ref 0.0–0.5)
Eosinophils Relative: 5 %
HCT: 36.4 % — ABNORMAL LOW (ref 39.0–52.0)
Hemoglobin: 12.5 g/dL — ABNORMAL LOW (ref 13.0–17.0)
Immature Granulocytes: 0 %
Lymphocytes Relative: 25 %
Lymphs Abs: 1.4 10*3/uL (ref 0.7–4.0)
MCH: 28.5 pg (ref 26.0–34.0)
MCHC: 34.3 g/dL (ref 30.0–36.0)
MCV: 82.9 fL (ref 80.0–100.0)
Monocytes Absolute: 0.8 10*3/uL (ref 0.1–1.0)
Monocytes Relative: 15 %
Neutro Abs: 3 10*3/uL (ref 1.7–7.7)
Neutrophils Relative %: 55 %
Platelets: 169 10*3/uL (ref 150–400)
RBC: 4.39 MIL/uL (ref 4.22–5.81)
RDW: 13.2 % (ref 11.5–15.5)
WBC: 5.5 10*3/uL (ref 4.0–10.5)
nRBC: 0 % (ref 0.0–0.2)

## 2021-01-03 LAB — BASIC METABOLIC PANEL
Anion gap: 9 (ref 5–15)
BUN: 17 mg/dL (ref 8–23)
CO2: 26 mmol/L (ref 22–32)
Calcium: 8.9 mg/dL (ref 8.9–10.3)
Chloride: 103 mmol/L (ref 98–111)
Creatinine, Ser: 1.34 mg/dL — ABNORMAL HIGH (ref 0.61–1.24)
GFR, Estimated: 56 mL/min — ABNORMAL LOW (ref >60)
Glucose, Bld: 128 mg/dL — ABNORMAL HIGH (ref 70–99)
Potassium: 3.9 mmol/L (ref 3.5–5.1)
Sodium: 138 mmol/L (ref 135–145)

## 2021-01-03 LAB — PROTIME-INR
INR: 1.1 (ref 0.8–1.2)
Prothrombin Time: 14 seconds (ref 11.4–15.2)

## 2021-01-03 MED ORDER — ASPIRIN 81 MG PO TBEC: 81.0000 mg | DELAYED_RELEASE_TABLET | Freq: Every day | ORAL | 11 refills | Status: DC

## 2021-01-03 MED ORDER — FENTANYL CITRATE (PF) 100 MCG/2ML IJ SOLN
INTRAMUSCULAR | Status: DC | PRN
  Administered 2021-01-03 (×2): 25 ug via INTRAVENOUS

## 2021-01-03 MED ORDER — FENTANYL CITRATE (PF) 100 MCG/2ML IJ SOLN
INTRAMUSCULAR | Status: AC
  Filled 2021-01-03: qty 2

## 2021-01-03 MED ORDER — ONETOUCH VERIO VI STRP: 1.0000 | ORAL_STRIP | Freq: Three times a day (TID) | Status: AC

## 2021-01-03 MED ORDER — TICAGRELOR 90 MG PO TABS: 90.0000 mg | ORAL_TABLET | Freq: Two times a day (BID) | ORAL | Status: DC

## 2021-01-03 MED ORDER — VERAPAMIL HCL 2.5 MG/ML IV SOLN
INTRAVENOUS | Status: AC
  Filled 2021-01-03: qty 2

## 2021-01-03 MED ORDER — NITROGLYCERIN 1 MG/10 ML FOR IR/CATH LAB
INTRA_ARTERIAL | Status: AC
  Filled 2021-01-03: qty 10

## 2021-01-03 MED ORDER — IOHEXOL 300 MG/ML  SOLN
50.0000 mL | Freq: Once | INTRAMUSCULAR | Status: AC | PRN
  Administered 2021-01-03: 25 mL via INTRA_ARTERIAL

## 2021-01-03 MED ORDER — VERAPAMIL HCL 2.5 MG/ML IV SOLN: INTRA_ARTERIAL | Status: DC | PRN

## 2021-01-03 MED ORDER — TICAGRELOR 90 MG PO TABS
90.0000 mg | ORAL_TABLET | Freq: Two times a day (BID) | ORAL | 1 refills | Status: DC
  Filled 2021-01-03: qty 60, 30d supply, fill #0

## 2021-01-03 MED ORDER — LISINOPRIL 2.5 MG PO TABS: 2.5000 mg | ORAL_TABLET | Freq: Every day | ORAL | Status: DC

## 2021-01-03 MED ORDER — MIDAZOLAM HCL 2 MG/2ML IJ SOLN
INTRAMUSCULAR | Status: DC | PRN
  Administered 2021-01-03 (×2): 1 mg via INTRAVENOUS

## 2021-01-03 MED ORDER — MIDAZOLAM HCL 2 MG/2ML IJ SOLN
INTRAMUSCULAR | Status: AC
  Filled 2021-01-03: qty 2

## 2021-01-03 MED ORDER — METFORMIN HCL ER 500 MG PO TB24: 500.0000 mg | ORAL_TABLET | Freq: Two times a day (BID) | ORAL | Status: DC

## 2021-01-03 MED ORDER — INSULIN GLARGINE 100 UNIT/ML ~~LOC~~ SOLN: 45.0000 [IU] | Freq: Two times a day (BID) | SUBCUTANEOUS | Status: DC

## 2021-01-03 MED ORDER — PANTOPRAZOLE SODIUM 40 MG PO TBEC: 40.0000 mg | DELAYED_RELEASE_TABLET | Freq: Every day | ORAL | Status: AC

## 2021-01-03 MED ORDER — IOHEXOL 300 MG/ML  SOLN
100.0000 mL | Freq: Once | INTRAMUSCULAR | Status: AC | PRN
  Administered 2021-01-03: 83 mL via INTRA_ARTERIAL

## 2021-01-03 MED ORDER — HEPARIN SODIUM (PORCINE) 1000 UNIT/ML IJ SOLN
INTRAMUSCULAR | Status: AC
  Filled 2021-01-03: qty 1

## 2021-01-03 NOTE — Sedation Documentation (Signed)
Pt arrived to IR suite alert and oriented x4. Pt is npo, consent signed. Pt has no complaints at this time. Pt was able to move himself to procedure table without difficulty. VSS. Prepping pt for procedure at this time.

## 2021-01-03 NOTE — Procedures (Signed)
S/P 4 vessel cerebral . Rt Rad approach. Findings. 1.Severe stenosis of Lt MCA inf division and of the sup division prox due to ICAD. 2. 50 % stenosis RT ICA cavernous seg. 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 S.Carine Nordgren MD

## 2021-01-03 NOTE — Sedation Documentation (Signed)
Patient is resting comfortably. 

## 2021-01-03 NOTE — Progress Notes (Signed)
Patient educated and discharged with wife.

## 2021-01-03 NOTE — Discharge Summary (Signed)
Physician Discharge Summary  Jeff [name] FWY:[id number] DOB: 1951/05/24 DOA: 01/01/2021  PCP: Anson Fret, MD  Admit date: 01/01/2021 Discharge date: 01/03/2021  Admitted From: home Discharge disposition: home   Recommendations for Outpatient Follow-Up:   Neurology follow up  aspirin and Brilinta for 3 months followed by aspirin alone  Outpatient PT Needs better blood sugar control   Discharge Diagnosis:   Active Problems:   TIA (transient ischemic attack)    Discharge Condition: Improved.  Diet recommendation: Low sodium, heart healthy.  Carbohydrate-modified.  Wound care: None.  Code status: Full.   History of Present Illness:   Jeff [name] is a 70 y.o. male patient with hx diabetes, hyperlipidemia, hypertension and prior stroke with right leg weakness who presents with worsening right leg weakness, fall and difficulty with speech.  Patient was relatively doing well at around 8 PM when he went to bed last night.  This morning, he woke up late and when he got out to go to the bathroom, he felt weak on his right leg and fell.  He crawled back to his bed and waited until his wife returned from church.  When his wife returned from church, she noticed that she was having difficulty with his speech.  He was subsequently brought to the ED by EMS. He denies having cough or shortness of breath.  No chest pain.  No headache or blurring of vision.   Hospital Course by Problem:  TIA: Patient presented with transient speech difficulties and bilateral lower extremity weakness right greater than left and MR angiogram as well as previous CT angiogram from last admission for stroke shows severe carotid siphon stenosis however cerebral catheter angiogram showed only 50% right cavernous carotid stenosis and severe stenosis of anterior and posterior divisions of the middle cerebral artery which cannot explain his presentation.  Hence recommend recommend aggressive medical therapy  rather than angioplasty stenting at the present time.  Recommend aspirin and Brilinta for 3 months followed by aspirin alone and aggressive risk factor modification.  DM -resume home meds -delay metformin 48 hours due to contrast  HTN -resume home meds  HLD -resume home meds   Medical Consultants:   IR neurology   Discharge Exam:   Vitals:   01/03/21 0945 01/03/21 1110  BP: 118/75 117/76  Pulse: 70 65  Resp: 14 15  Temp:  97.7 F (36.5 C)  SpO2: 97% 99%   Vitals:   01/03/21 0935 01/03/21 0940 01/03/21 0945 01/03/21 1110  BP: 140/78 126/79 118/75 117/76  Pulse: 68 66 70 65  Resp: 13 19 14 15   Temp:    97.7 F (36.5 C)  TempSrc:    Oral  SpO2: 99% 100% 97% 99%  Weight:      Height:        General exam: Appears calm and comfortable.   The results of significant diagnostics from this hospitalization (including imaging, microbiology, ancillary and laboratory) are listed below for reference.     Procedures and Diagnostic Studies:   CT Head Wo Contrast  Result Date: 01/01/2021 CLINICAL DATA:  Aphasia and weakness. EXAM: CT HEAD WITHOUT CONTRAST TECHNIQUE: Contiguous axial images were obtained from the base of the skull through the vertex without intravenous contrast. COMPARISON:  Brain CT 03/10/2020 FINDINGS: Brain: Ventricles and sulci are prominent compatible with atrophy. Periventricular and subcortical white matter hypodensities compatible with chronic microvascular ischemic changes. No evidence for acute cortically based infarct, intracranial hemorrhage, mass lesion or mass-effect. Probable chronic  lacunar infarct within the left basal ganglia. Vascular: Unremarkable Skull: Intact Sinuses/Orbits: Paranasal sinuses are well aerated. Mastoid air cells are unremarkable. Other: None. IMPRESSION: No acute intracranial process. Electronically Signed   By: Annia Belt M.D.   On: 01/01/2021 14:47   MR ANGIO HEAD WO CONTRAST  Result Date: 01/01/2021 CLINICAL DATA:   Initial evaluation for acute TIA. EXAM: MRA HEAD WITHOUT CONTRAST TECHNIQUE: Angiographic images of the Circle of Willis were acquired using MRA technique without intravenous contrast. COMPARISON:  Prior study from 03/10/2020. FINDINGS: Anterior circulation: Petrous segments patent bilaterally. Atheromatous change throughout the carotid siphons with associated moderate stenosis at the anterior genu of the cavernous left ICA (series 5, image 48). More mild multifocal narrowing of a noted about the right siphon. A1 segments patent bilaterally. Normal anterior communicating artery complex. Anterior cerebral arteries patent to their distal aspects without stenosis. No M1 stenosis or occlusion. Normal MCA bifurcations. Distal MCA branches perfused and symmetric. Posterior circulation: Neither vertebral artery well assessed on this exam. Basilar patent to its distal aspect without stenosis. Superior cerebral arteries patent bilaterally. Both PCAs primarily supplied via the basilar. Left PCA mildly irregular but well perfused to its distal aspect without stenosis. Moderate stenosis seen about the right P1 segment (series 1037, image 1). Right PCA irregular but otherwise patent to its distal aspect. Anatomic variants: None significant. Other: No aneurysm. IMPRESSION: 1. Negative intracranial MRA for large vessel occlusion. 2. Atherosclerotic change involving both carotid siphons with associated moderate stenosis at the anterior genu of the cavernous left ICA. 3. Moderate stenosis about the right P1 segment. Electronically Signed   By: Rise Mu M.D.   On: 01/01/2021 22:10   MR BRAIN WO CONTRAST  Result Date: 01/01/2021 CLINICAL DATA:  Transient ischemic attack. Additional history provided: Worsening right-sided leg weakness, fall, difficulty with speech. EXAM: MRI HEAD WITHOUT CONTRAST TECHNIQUE: Multiplanar, multiecho pulse sequences of the brain and surrounding structures were obtained without intravenous  contrast. COMPARISON:  Prior head CT examinations 01/01/2021 and earlier. Brain MRI 03/10/2020. CT angiogram head/neck 03/10/2020. FINDINGS: Brain: Moderate central predominant cerebral atrophy. Moderate multifocal T2/FLAIR hyperintensity within the cerebral white matter and pons, nonspecific but compatible with chronic small vessel ischemic disease. Redemonstrated chronic lacunar infarct within the right basal ganglia. There is no acute infarct. No evidence of an intracranial mass. No chronic intracranial blood products. No extra-axial fluid collection. No midline shift. Vascular: Unchanged signal abnormality within the V4 left vertebral artery compatible with known chronic occlusion of this vessel. Flow voids are otherwise maintained within the proximal large arterial vessels. Skull and upper cervical spine: No focal marrow lesion. Sinuses/Orbits: Visualized orbits show no acute finding. Trace bilateral ethmoid, sphenoid and maxillary sinus mucosal thickening. IMPRESSION: No evidence of acute intracranial abnormality. Redemonstrated chronic lacunar infarct within the right basal ganglia. Moderate chronic small vessel ischemic changes within the cerebral white matter and pons, stable as compared to the brain MRI of 03/10/2020. Moderate central predominant cerebral atrophy, stable. 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 Minimal paranasal sinus disease, as described. Electronically Signed   By: Jackey Loge DO   On: 01/01/2021 19:10   DG Chest Port 1 View  Result Date: 01/01/2021 CLINICAL DATA:  Weakness.  TIA. EXAM: PORTABLE CHEST 1 VIEW COMPARISON:  May 30, 2011 FINDINGS: The heart size and mediastinal contours are within normal limits. Both lungs are clear. The visualized skeletal structures are unremarkable. IMPRESSION: No active disease. Electronically Signed   By: Gerome Sam III M.D  On: 01/01/2021 15:11   ECHOCARDIOGRAM COMPLETE  Result Date: 01/02/2021    ECHOCARDIOGRAM  REPORT   Patient Name:   Jeff [name] Date of Exam: 01/02/2021 Medical Rec #:  [id number]  Height:       68.0 in Accession #:    [id number] Weight:       206.0 lb Date of Birth:  [birth year]  BSA:          2.070 m Patient Age:    70 years   BP:           116/67 mmHg Patient Gender: M          HR:           75 bpm. Exam Location:  Inpatient Procedure: 2D Echo, 3D Echo, Intracardiac Opacification Agent, Cardiac Doppler            and Color Doppler Indications:    TIA  History:        Patient has prior history of Echocardiogram examinations, most                 recent 03/10/2020. TIA and Stroke; Risk Factors:Dyslipidemia and                 Diabetes.  Sonographer:    Sheralyn Boatman RDCS Referring Phys: [id number] [facility]  Sonographer Comments: Technically difficult study due to poor echo windows. Image acquisition challenging due to patient body habitus. IMPRESSIONS  1. Left ventricular ejection fraction, by estimation, is 60 to 65%. The left ventricle has normal function. The left ventricle has no regional wall motion abnormalities. Left ventricular diastolic parameters were normal.  2. Right ventricular systolic function is normal. The right ventricular size is normal. Tricuspid regurgitation signal is inadequate for assessing PA pressure.  3. The mitral valve is normal in structure. No evidence of mitral valve regurgitation.  4. The aortic valve was not well visualized. Aortic valve regurgitation is trivial. Mild to moderate aortic valve sclerosis/calcification is present, without any evidence of aortic stenosis.  5. The inferior vena cava is normal in size with greater than 50% respiratory variability, suggesting right atrial pressure of 3 mmHg. FINDINGS  Left Ventricle: Left ventricular ejection fraction, by estimation, is 60 to 65%. The left ventricle has normal function. The left ventricle has no regional wall motion abnormalities. Definity contrast agent was given IV to delineate the left ventricular   endocardial borders. The left ventricular internal cavity size was normal in size. There is no left ventricular hypertrophy. Left ventricular diastolic parameters were normal. Right Ventricle: The right ventricular size is normal. No increase in right ventricular wall thickness. Right ventricular systolic function is normal. Tricuspid regurgitation signal is inadequate for assessing PA pressure. Left Atrium: Left atrial size was normal in size. Right Atrium: Right atrial size was normal in size. Pericardium: There is no evidence of pericardial effusion. Mitral Valve: The mitral valve is normal in structure. No evidence of mitral valve regurgitation. Tricuspid Valve: The tricuspid valve is normal in structure. Tricuspid valve regurgitation is trivial. Aortic Valve: The aortic valve was not well visualized. Aortic valve regurgitation is trivial. Mild to moderate aortic valve sclerosis/calcification is present, without any evidence of aortic stenosis. Pulmonic Valve: The pulmonic valve was not well visualized. Pulmonic valve regurgitation is trivial. Aorta: The aortic root and ascending aorta are structurally normal, with no evidence of dilitation. Venous: The inferior vena cava is normal in size with greater than 50% respiratory variability, suggesting right atrial pressure of  3 mmHg. IAS/Shunts: The interatrial septum was not well visualized.  LEFT VENTRICLE PLAX 2D LVIDd:         4.60 cm     Diastology LVIDs:         2.70 cm     LV e' medial:    7.72 cm/s LV PW:         0.90 cm     LV E/e' medial:  12.8 LV IVS:        1.00 cm     LV e' lateral:   10.10 cm/s LVOT diam:     2.00 cm     LV E/e' lateral: 9.8 LV SV:         68 LV SV Index:   33 LVOT Area:     3.14 cm  LV Volumes (MOD) LV vol d, MOD A2C: 50.7 ml LV vol d, MOD A4C: 89.5 ml LV vol s, MOD A2C: 13.7 ml LV vol s, MOD A4C: 37.8 ml LV SV MOD A2C:     37.0 ml LV SV MOD A4C:     89.5 ml LV SV MOD BP:      44.3 ml RIGHT VENTRICLE         IVC TAPSE (M-mode): 1.7 cm   IVC diam: 1.70 cm LEFT ATRIUM             Index       RIGHT ATRIUM          Index LA diam:        2.90 cm 1.40 cm/m  RA Area:     9.24 cm LA Vol (A2C):   41.4 ml 20.00 ml/m RA Volume:   16.20 ml 7.83 ml/m LA Vol (A4C):   26.9 ml 13.00 ml/m LA Biplane Vol: 35.2 ml 17.01 ml/m  AORTIC VALVE LVOT Vmax:   123.00 cm/s LVOT Vmean:  78.600 cm/s LVOT VTI:    0.218 m  AORTA Ao Root diam: 3.60 cm Ao Asc diam:  3.60 cm MITRAL VALVE MV Area (PHT): 3.21 cm     SHUNTS MV Decel Time: 236 msec     Systemic VTI:  0.22 m MV E velocity: 99.10 cm/s   Systemic Diam: 2.00 cm MV A velocity: 125.00 cm/s MV E/A ratio:  0.79 Epifanio Lesches MD Electronically signed by Epifanio Lesches MD Signature Date/Time: 01/02/2021/11:49:42 AM    Final    VAS US CAROTID (at [facility] and [facility] only)  Result Date: 01/02/2021 Carotid Arterial Duplex Study Patient Name:  Jeff [name]  Date of Exam:   01/02/2021 Medical Rec #: [id number]   Accession #:    [id number] Date of Birth: 08-Jun-1950   Patient Gender: M Patient Age:   70Y Exam Location:  [facility] Procedure:      VAS US CAROTID Referring Phys: [id number] [facility] --------------------------------------------------------------------------------  Indications:  TIA. Risk Factors: Hypertension, hyperlipidemia, Diabetes, prior CVA. Performing Technologist: Jean Rosenthal RDMS,RVT  Examination Guidelines: A complete evaluation includes B-mode imaging, spectral Doppler, color Doppler, and power Doppler as needed of all accessible portions of each vessel. Bilateral testing is considered an integral part of a complete examination. Limited examinations for reoccurring indications may be performed as noted.  Right Carotid Findings: +----------+--------+--------+--------+---------------------+------------------+           PSV cm/sEDV cm/sStenosisPlaque Description   Comments           +----------+--------+--------+--------+---------------------+------------------+ CCA Prox  80       16  intimal thickening +----------+--------+--------+--------+---------------------+------------------+ CCA Distal62      17              heterogenous and                                                          diffuse                                 +----------+--------+--------+--------+---------------------+------------------+ ICA Prox  66      25      1-39%   hypoechoic and                                                            heterogenous                            +----------+--------+--------+--------+---------------------+------------------+ ICA Distal51      21                                                      +----------+--------+--------+--------+---------------------+------------------+ ECA       79                                                              +----------+--------+--------+--------+---------------------+------------------+ +----------+--------+-------+----------------+-------------------+           PSV cm/sEDV cmsDescribe        Arm Pressure (mmHG) +----------+--------+-------+----------------+-------------------+ [id number]            Multiphasic, WNL                    +----------+--------+-------+----------------+-------------------+ +---------+--------+--+--------+--+---------+ VertebralPSV cm/s48EDV cm/s15Antegrade +---------+--------+--+--------+--+---------+  Left Carotid Findings: +----------+--------+--------+--------+---------------------+------------------+           PSV cm/sEDV cm/sStenosisPlaque Description   Comments           +----------+--------+--------+--------+---------------------+------------------+ CCA Prox  99      18                                   intimal thickening +----------+--------+--------+--------+---------------------+------------------+ CCA Distal79      16                                   intimal thickening  +----------+--------+--------+--------+---------------------+------------------+ ICA Prox  67      19              hypoechoic and  heterogenous                            +----------+--------+--------+--------+---------------------+------------------+ ICA Distal58      19                                                      +----------+--------+--------+--------+---------------------+------------------+ ECA       93      10                                                      +----------+--------+--------+--------+---------------------+------------------+ +----------+--------+--------+-------------------+-------------------+           PSV cm/sEDV cm/sDescribe           Arm Pressure (mmHG) +----------+--------+--------+-------------------+-------------------+ Subclavian217             Elevated velocities                    +----------+--------+--------+-------------------+-------------------+ +---------+--------+--+--------+--------------+ VertebralPSV cm/s43EDV cm/sHigh resistant +---------+--------+--+--------+--------------+   Summary: Right Carotid: Velocities in the right ICA are consistent with a 1-39% stenosis. Left Carotid: Velocities in the left ICA are consistent with a 1-39% stenosis. Vertebrals:  Right vertebral artery demonstrates antegrade flow. Left vertebral              artery demonstrates high resistant flow. Subclavians: Normal flow hemodynamics were seen in the right subclavian artery.              High velocities seen in the left subclavian artery. *See table(s) above for measurements and observations.     Preliminary      Labs:   Basic Metabolic Panel: Recent Labs  Lab 01/01/21 1454 01/03/21 0355  NA 134* 138  K 4.0 3.9  CL 103 103  CO2 21* 26  GLUCOSE 177* 128*  BUN 17 17  CREATININE 1.56* 1.34*  CALCIUM 9.0 8.9   GFR Estimated Creatinine Clearance: 55.3 mL/min (A) (by  C-G formula based on SCr of 1.34 mg/dL (H)). Liver Function Tests: Recent Labs  Lab 01/01/21 1454  AST 17  ALT 20  ALKPHOS 50  BILITOT 0.8  PROT 6.8  ALBUMIN 3.6   No results for input(s): LIPASE, AMYLASE in the last 168 hours. No results for input(s): AMMONIA in the last 168 hours. Coagulation profile Recent Labs  Lab 01/03/21 0355  INR 1.1    CBC: Recent Labs  Lab 01/01/21 1454 01/03/21 0355  WBC 11.8* 5.5  NEUTROABS  --  3.0  HGB 14.2 12.5*  HCT 41.1 36.4*  MCV 83.4 82.9  PLT 213 169   Cardiac Enzymes: No results for input(s): CKTOTAL, CKMB, CKMBINDEX, TROPONINI in the last 168 hours. BNP: Invalid input(s): POCBNP CBG: Recent Labs  Lab 01/02/21 1211 01/02/21 1907 01/02/21 2126 01/03/21 0630 01/03/21 1156  GLUCAP 258* 321* 270* 155* 125*   D-Dimer No results for input(s): DDIMER in the last 72 hours. Hgb A1c Recent Labs    01/02/21 0237  HGBA1C 8.6*   Lipid Profile Recent Labs    01/02/21 0237  CHOL 96  HDL 25*  LDLCALC 41  TRIG 161*  CHOLHDL 3.8   Thyroid function studies No results  for input(s): TSH, T4TOTAL, T3FREE, THYROIDAB in the last 72 hours.  Invalid input(s): FREET3 Anemia work up No results for input(s): VITAMINB12, FOLATE, FERRITIN, TIBC, IRON, RETICCTPCT in the last 72 hours. Microbiology Recent Results (from the past 240 hour(s))  Resp Panel by RT-PCR (Flu A&B, Covid) Nasopharyngeal Swab     Status: None   Collection Time: 01/01/21  4:53 PM   Specimen: Nasopharyngeal Swab; Nasopharyngeal(NP) swabs in vial transport medium  Result Value Ref Range Status   SARS Coronavirus 2 by RT PCR NEGATIVE NEGATIVE Final    Comment: (NOTE) SARS-CoV-2 target nucleic acids are NOT DETECTED.  The SARS-CoV-2 RNA is generally detectable in upper respiratory specimens during the acute phase of infection. The lowest concentration of SARS-CoV-2 viral copies this assay can detect is 138 copies/mL. A negative result does not preclude  SARS-Cov-2 infection and should not be used as the sole basis for treatment or other patient management decisions. A negative result may occur with  improper specimen collection/handling, submission of specimen other than nasopharyngeal swab, presence of viral mutation(s) within the areas targeted by this assay, and inadequate number of viral copies(<138 copies/mL). A negative result must be combined with clinical observations, patient history, and epidemiological information. The expected result is Negative.  Fact Sheet for Patients:  BloggerCourse.com  Fact Sheet for Healthcare Providers:  SeriousBroker.it  This test is no t yet approved or cleared by the [place] FDA and  has been authorized for detection and/or diagnosis of SARS-CoV-2 by FDA under an Emergency Use Authorization (EUA). This EUA will remain  in effect (meaning this test can be used) for the duration of the COVID-19 declaration under Section 564(b)(1) of the Act, 21 U.S.C.section 360bbb-3(b)(1), unless the authorization is terminated  or revoked sooner.       Influenza A by PCR NEGATIVE NEGATIVE Final   Influenza B by PCR NEGATIVE NEGATIVE Final    Comment: (NOTE) The Xpert Xpress SARS-CoV-2/FLU/RSV plus assay is intended as an aid in the diagnosis of influenza from Nasopharyngeal swab specimens and should not be used as a sole basis for treatment. Nasal washings and aspirates are unacceptable for Xpert Xpress SARS-CoV-2/FLU/RSV testing.  Fact Sheet for Patients: BloggerCourse.com  Fact Sheet for Healthcare Providers: SeriousBroker.it  This test is not yet approved or cleared by the [place] FDA and has been authorized for detection and/or diagnosis of SARS-CoV-2 by FDA under an Emergency Use Authorization (EUA). This EUA will remain in effect (meaning this test can be used) for the duration of  the COVID-19 declaration under Section 564(b)(1) of the Act, 21 U.S.C. section 360bbb-3(b)(1), unless the authorization is terminated or revoked.  Performed at [facility], [address] [place], [place] [address]      Discharge Instructions:   Discharge Instructions     Ambulatory referral to Neurology   Complete by: As directed    An appointment is requested in approximately: 8 weeks   Ambulatory referral to Physical Therapy   Complete by: As directed    [facility] at the [facility]   Diet Carb Modified   Complete by: As directed    Increase activity slowly   Complete by: As directed       Allergies as of 01/03/2021       Reactions   Flexeril [cyclobenzaprine Hcl] Other (See Comments)   Causes him to pass out and lowers his BP   Lidocaine Swelling   throat   Novocain [procaine Hcl] Swelling  Medication List     STOP taking these medications    clopidogrel 75 MG tablet Commonly known as: PLAVIX       TAKE these medications    acetaminophen 500 MG tablet Commonly known as: TYLENOL Take 500-1,000 mg by mouth every 6 (six) hours as needed for headache.   aspirin 81 MG EC tablet Take 1 tablet (81 mg total) by mouth daily. Swallow whole. Start taking on: January 04, 2021   Brilinta 90 MG Tabs tablet Generic drug: ticagrelor Take 1 tablet (90 mg total) by mouth 2 (two) times daily.   Glucagon 1 MG/0.2ML Soaj Inject 1 mg into the skin as needed (low blood sugar).   ICAPS AREDS 2 PO Take 1 capsule by mouth in the morning and at bedtime.   insulin glargine 100 UNIT/ML injection Commonly known as: LANTUS Inject 0.45-0.55 mLs (45-55 Units total) into the skin 2 (two) times daily. Inject 45 units in the morning and 55 units at bedtime   Insulin Syringe-Needle U-100 31G X 5/16" 1 ML Misc Commonly known as: B-D INS SYR ULTRAFINE 1CC/31G Used to inject insulin twice daily.   levothyroxine 50 MCG tablet Commonly known as:  SYNTHROID Take 50 mcg by mouth daily before breakfast.   lisinopril 2.5 MG tablet Commonly known as: ZESTRIL Take 1 tablet (2.5 mg total) by mouth daily. Start taking on: January 09, 2021 What changed: These instructions start on January 09, 2021. If you are unsure what to do until then, ask your doctor or other care provider.   Magnesium Oxide 420 MG Tabs Take 1 tablet by mouth in the morning and at bedtime.   metFORMIN 500 MG 24 hr tablet Commonly known as: GLUCOPHAGE-XR Take 1 tablet (500 mg total) by mouth in the morning and at bedtime. Start taking on: January 06, 2021 What changed: These instructions start on January 06, 2021. If you are unsure what to do until then, ask your doctor or other care provider.   NovoLOG FlexPen 100 UNIT/ML FlexPen Generic drug: insulin aspart Inject 23 Units into the skin 3 (three) times daily as needed for high blood sugar. Inject 23 units TID PRN If BS >120 BUT HOLD if BS<120   OneTouch Verio test strip Generic drug: glucose blood 1 each by Other route 4 (four) times daily -  before meals and at bedtime. dexcom 6   pantoprazole 40 MG tablet Commonly known as: PROTONIX Take 1 tablet (40 mg total) by mouth daily.   rosuvastatin 40 MG tablet Commonly known as: CRESTOR TAKE 1 TABLET BY MOUTH DAILY   Vitamin D 50 MCG (2000 UT) tablet Take 2,000 Units by mouth daily.        Follow-up Information     [facility] Follow [facility]   Specialty: Rehabilitation Why: The outpatient therapy will contact you for the first appointment Contact information: [address] [id number] [place] [id number] [phone]        Anson FretJones, Christopher, MD Follow up in 1 week(s).   Specialty: Family Medicine Contact information: [address] [place] [id number] [phone]                  Time coordinating discharge: 35 min  Signed:  Joseph ArtJessica U Carollee Nussbaumer  DO  [facility] 01/03/2021, 4:29 PM

## 2021-01-03 NOTE — Progress Notes (Signed)
Physical Therapy Treatment Patient Details Name: Jeff [name] MRN: [id number] DOB: Mar 23, 1951 Today's Date: 01/03/2021    History of Present Illness 70 yo male presents to [facility] on 7/31 for aphasia, increasing R weakness with fall, altered sensation LEs. CTH negative for acute findings, workup for TIA.  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 PMH includes CVA with residual mild R weakness, HLD, DM,HTN, retinopathy, CKD.    PT Comments    Pt making good progress with improved balance today.  He has been mobilizing in room with wife to supervise, therapy session focused on dynamic balance. He does present with deficits in higher level balance activities that will benefit from outpt PT, but is safe to continue to mobilize in room with family.  Therapy will continue to progress as able.     Follow Up Recommendations  Supervision - Intermittent (outpt PT)     Equipment Recommendations  None recommended by PT    Recommendations for Other Services       Precautions / Restrictions Precautions Precautions: Fall    Mobility  Bed Mobility Overal bed mobility: Modified Independent             General bed mobility comments: Pt mobilizing around room with supervision from wife    Transfers Overall transfer level: Needs assistance Equipment used: None Transfers: Sit to/from Stand Sit to Stand: Supervision         General transfer comment: Supervision for safety  Ambulation/Gait Ambulation/Gait assistance: Supervision Gait Distance (Feet): 350 Feet Assistive device: None   Gait velocity: normal   General Gait Details: Pt with step through pattern at normal speed.  He expressed fear of falling by catching feet with non-skid socks.  Cued for increased foot clearance and heel strike.  Pt demonstrated safe gait without LOB with normal gait.  See balance for further challenges   Stairs   Stairs assistance:  Supervision Stair Management: Alternating pattern;No rails Number of Stairs: 5     Wheelchair Mobility    Modified Rankin (Stroke Patients Only) Modified Rankin (Stroke Patients Only) Pre-Morbid Rankin Score: Slight disability Modified Rankin: Moderately severe disability     Balance Overall balance assessment: Needs assistance Sitting-balance support: No upper extremity supported Sitting balance-Leahy Scale: Good     Standing balance support: No upper extremity supported Standing balance-Leahy Scale: Good Standing balance comment: Static stand with weight shifts, head turns, reaching without LOB             High level balance activites: Side stepping;Backward walking;Direction changes;Turns;Head turns High Level Balance Comments: Performed above and marching with min guard but no overt LOB. Unable to tandem gait and pt reports difficulty on unlevel surfaces            Cognition Arousal/Alertness: Awake/alert Behavior During Therapy: WFL for tasks assessed/performed Overall Cognitive Status: Within Functional Limits for tasks assessed                                 General Comments: Pt expressing frustrations about being told different things by different MDs (one said medical management and one said stent) - verbalized understanding but addressed he would need to discuss further plans with MD. He did express "I'm going to have another stroke if they send me home without doing anything."  Wife present and verbalized a list of things that they are changing and have  discussed including lifestyle, medications, and f/u with MD.    RN recommended havign pt keep R arm still due to IR procedure this morning but ok to mobilize,  Pt needed continual cues to keep arm still - he tended to "talk with his hands"    Exercises      General Comments General comments (skin integrity, edema, etc.): Pt expresses interest in returning to outpt PT for balance and  strengthening - particularly aquatic therapy that he did in past.  He does have hx of falls and decreased dynamic balance - continue to recommend outpt PT. Noted case manager note contacting outpt and order sent.      Pertinent Vitals/Pain Pain Assessment: No/denies pain    Home Living                      Prior Function            PT Goals (current goals can now be found in the care plan section) Acute Rehab PT Goals Patient Stated Goal: to go home PT Goal Formulation: With patient/family Time For Goal Achievement: 01/16/21 Potential to Achieve Goals: Good Progress towards PT goals: Progressing toward goals    Frequency    Min 3X/week      PT Plan Frequency needs to be updated    Co-evaluation              AM-PAC PT "6 Clicks" Mobility   Outcome Measure  Help needed turning from your back to your side while in a flat bed without using bedrails?: None Help needed moving from lying on your back to sitting on the side of a flat bed without using bedrails?: None Help needed moving to and from a bed to a chair (including a wheelchair)?: None Help needed standing up from a chair using your arms (e.g., wheelchair or bedside chair)?: None Help needed to walk in hospital room?: None Help needed climbing 3-5 steps with a railing? : A Little 6 Click Score: 23    End of Session Equipment Utilized During Treatment: Gait belt Activity Tolerance: Patient tolerated treatment well Patient left: with call bell/phone within reach;with family/visitor present;in bed Nurse Communication: Mobility status PT Visit Diagnosis: Other abnormalities of gait and mobility (R26.89);Other symptoms and signs involving the nervous system (R29.898)     Time: 1884-1660 PT Time Calculation (min) (ACUTE ONLY): 17 min  Charges:  $Neuromuscular Re-education: 8-22 mins                     Anise Salvo, PT Acute Rehab Services Pager [phone] [facility] [phone]    Rayetta Humphrey 01/03/2021, 1:52 PM

## 2021-01-03 NOTE — Sedation Documentation (Signed)
Patient is resting comfortably. VSS. Procedure continues 

## 2021-01-03 NOTE — Progress Notes (Signed)
STROKE TEAM PROGRESS NOTE     INTERVAL HISTORY His wife is at the bedside.  Cerebral catheter angiogram shows severe focal stenosis of superior and inferior division of the left middle cerebral artery and 50% stenosis of right carotid cavernous carotid siphon.  Patient is doing well and wants to go home. Vital signs stable.  Neurological exam is unchanged   OBJECTIVE Vitals:   01/03/21 0935 01/03/21 0940 01/03/21 0945 01/03/21 1110  BP: 140/78 126/79 118/75 117/76  Pulse: 68 66 70 65  Resp: 13 19 14 15   Temp:    97.7 F (36.5 C)  TempSrc:    Oral  SpO2: 99% 100% 97% 99%  Weight:      Height:        CBC:  Recent Labs  Lab 01/01/21 1454 01/03/21 0355  WBC 11.8* 5.5  NEUTROABS  --  3.0  HGB 14.2 12.5*  HCT 41.1 36.4*  MCV 83.4 82.9  PLT 213 169    Basic Metabolic Panel:  Recent Labs  Lab 01/01/21 1454 01/03/21 0355  NA 134* 138  K 4.0 3.9  CL 103 103  CO2 21* 26  GLUCOSE 177* 128*  BUN 17 17  CREATININE 1.56* 1.34*  CALCIUM 9.0 8.9    Lipid Panel:     Component Value Date/Time   CHOL 96 01/02/2021 0237   CHOL 226 (H) 12/30/2017 0846   TRIG 152 (H) 01/02/2021 0237   HDL 25 (L) 01/02/2021 0237   HDL 40 12/30/2017 0846   CHOLHDL 3.8 01/02/2021 0237   VLDL 30 01/02/2021 0237   LDLCALC 41 01/02/2021 0237   LDLCALC 150 (H) 12/30/2017 0846   HgbA1c:  Lab Results  Component Value Date   HGBA1C 8.6 (H) 01/02/2021   Urine Drug Screen: No results found for: LABOPIA, COCAINSCRNUR, LABBENZ, AMPHETMU, THCU, LABBARB  Alcohol Level No results found for: ETH  IMAGING   MR ANGIO HEAD WO CONTRAST  Result Date: 01/01/2021 CLINICAL DATA:  Initial evaluation for acute TIA. EXAM: MRA HEAD WITHOUT CONTRAST TECHNIQUE: Angiographic images of the Circle of Willis were acquired using MRA technique without intravenous contrast. COMPARISON:  Prior study from 03/10/2020. FINDINGS: Anterior circulation: Petrous segments patent bilaterally. Atheromatous change throughout the  carotid siphons with associated moderate stenosis at the anterior genu of the cavernous left ICA (series 5, image 48). More mild multifocal narrowing of a noted about the right siphon. A1 segments patent bilaterally. Normal anterior communicating artery complex. Anterior cerebral arteries patent to their distal aspects without stenosis. No M1 stenosis or occlusion. Normal MCA bifurcations. Distal MCA branches perfused and symmetric. Posterior circulation: Neither vertebral artery well assessed on this exam. Basilar patent to its distal aspect without stenosis. Superior cerebral arteries patent bilaterally. Both PCAs primarily supplied via the basilar. Left PCA mildly irregular but well perfused to its distal aspect without stenosis. Moderate stenosis seen about the right P1 segment (series 1037, image 1). Right PCA irregular but otherwise patent to its distal aspect. Anatomic variants: None significant. Other: No aneurysm. IMPRESSION: 1. Negative intracranial MRA for large vessel occlusion. 2. Atherosclerotic change involving both carotid siphons with associated moderate stenosis at the anterior genu of the cavernous left ICA. 3. Moderate stenosis about the right P1 segment. Electronically Signed   By: 05/10/2020 M.D.   On: 01/01/2021 22:10   MR BRAIN WO CONTRAST  Result Date: 01/01/2021 CLINICAL DATA:  Transient ischemic attack. Additional history provided: Worsening right-sided leg weakness, fall, difficulty with speech. EXAM: MRI HEAD WITHOUT CONTRAST  TECHNIQUE: Multiplanar, multiecho pulse sequences of the brain and surrounding structures were obtained without intravenous contrast. COMPARISON:  Prior head CT examinations 01/01/2021 and earlier. Brain MRI 03/10/2020. CT angiogram head/neck 03/10/2020. FINDINGS: Brain: Moderate central predominant cerebral atrophy. Moderate multifocal T2/FLAIR hyperintensity within the cerebral white matter and pons, nonspecific but compatible with chronic small  vessel ischemic disease. Redemonstrated chronic lacunar infarct within the right basal ganglia. There is no acute infarct. No evidence of an intracranial mass. No chronic intracranial blood products. No extra-axial fluid collection. No midline shift. Vascular: Unchanged signal abnormality within the V4 left vertebral artery compatible with known chronic occlusion of this vessel. Flow voids are otherwise maintained within the proximal large arterial vessels. Skull and upper cervical spine: No focal marrow lesion. Sinuses/Orbits: Visualized orbits show no acute finding. Trace bilateral ethmoid, sphenoid and maxillary sinus mucosal thickening. IMPRESSION: No evidence of acute intracranial abnormality. Redemonstrated chronic lacunar infarct within the right basal ganglia. Moderate chronic small vessel ischemic changes within the cerebral white matter and pons, stable as compared to the brain MRI of 03/10/2020. Moderate central predominant cerebral atrophy, stable. 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 Minimal paranasal sinus disease, as described. Electronically Signed   By: Jackey LogeKyle  Golden DO   On: 01/01/2021 19:10   DG Chest Port 1 View  Result Date: 01/01/2021 CLINICAL DATA:  Weakness.  TIA. EXAM: PORTABLE CHEST 1 VIEW COMPARISON:  May 30, 2011 FINDINGS: The heart size and mediastinal contours are within normal limits. Both lungs are clear. The visualized skeletal structures are unremarkable. IMPRESSION: No active disease. Electronically Signed   By: Gerome Samavid  Williams III M.D   On: 01/01/2021 15:11   ECHOCARDIOGRAM COMPLETE  Result Date: 01/02/2021    ECHOCARDIOGRAM REPORT   Patient Name:   Jeff [name] Date of Exam: 01/02/2021 Medical Rec #:  [id number]  Height:       68.0 in Accession #:    [id number] Weight:       206.0 lb Date of Birth:  1950/10/01  BSA:          2.070 m Patient Age:    70 years   BP:           116/67 mmHg Patient Gender: M          HR:           75 bpm. Exam Location:   Inpatient Procedure: 2D Echo, 3D Echo, Intracardiac Opacification Agent, Cardiac Doppler            and Color Doppler Indications:    TIA  History:        Patient has prior history of Echocardiogram examinations, most                 recent 03/10/2020. TIA and Stroke; Risk Factors:Dyslipidemia and                 Diabetes.  Sonographer:    Sheralyn Boatmanina West RDCS Referring Phys: [id number] [facility]  Sonographer Comments: Technically difficult study due to poor echo windows. Image acquisition challenging due to patient body habitus. IMPRESSIONS  1. Left ventricular ejection fraction, by estimation, is 60 to 65%. The left ventricle has normal function. The left ventricle has no regional wall motion abnormalities. Left ventricular diastolic parameters were normal.  2. Right ventricular systolic function is normal. The right ventricular size is normal. Tricuspid regurgitation signal is inadequate for assessing PA pressure.  3. The mitral valve is normal in structure. No evidence  of mitral valve regurgitation.  4. The aortic valve was not well visualized. Aortic valve regurgitation is trivial. Mild to moderate aortic valve sclerosis/calcification is present, without any evidence of aortic stenosis.  5. The inferior vena cava is normal in size with greater than 50% respiratory variability, suggesting right atrial pressure of 3 mmHg. FINDINGS  Left Ventricle: Left ventricular ejection fraction, by estimation, is 60 to 65%. The left ventricle has normal function. The left ventricle has no regional wall motion abnormalities. Definity contrast agent was given IV to delineate the left ventricular  endocardial borders. The left ventricular internal cavity size was normal in size. There is no left ventricular hypertrophy. Left ventricular diastolic parameters were normal. Right Ventricle: The right ventricular size is normal. No increase in right ventricular wall thickness. Right ventricular systolic function is normal.  Tricuspid regurgitation signal is inadequate for assessing PA pressure. Left Atrium: Left atrial size was normal in size. Right Atrium: Right atrial size was normal in size. Pericardium: There is no evidence of pericardial effusion. Mitral Valve: The mitral valve is normal in structure. No evidence of mitral valve regurgitation. Tricuspid Valve: The tricuspid valve is normal in structure. Tricuspid valve regurgitation is trivial. Aortic Valve: The aortic valve was not well visualized. Aortic valve regurgitation is trivial. Mild to moderate aortic valve sclerosis/calcification is present, without any evidence of aortic stenosis. Pulmonic Valve: The pulmonic valve was not well visualized. Pulmonic valve regurgitation is trivial. Aorta: The aortic root and ascending aorta are structurally normal, with no evidence of dilitation. Venous: The inferior vena cava is normal in size with greater than 50% respiratory variability, suggesting right atrial pressure of 3 mmHg. IAS/Shunts: The interatrial septum was not well visualized.  LEFT VENTRICLE PLAX 2D LVIDd:         4.60 cm     Diastology LVIDs:         2.70 cm     LV e' medial:    7.72 cm/s LV PW:         0.90 cm     LV E/e' medial:  12.8 LV IVS:        1.00 cm     LV e' lateral:   10.10 cm/s LVOT diam:     2.00 cm     LV E/e' lateral: 9.8 LV SV:         68 LV SV Index:   33 LVOT Area:     3.14 cm  LV Volumes (MOD) LV vol d, MOD A2C: 50.7 ml LV vol d, MOD A4C: 89.5 ml LV vol s, MOD A2C: 13.7 ml LV vol s, MOD A4C: 37.8 ml LV SV MOD A2C:     37.0 ml LV SV MOD A4C:     89.5 ml LV SV MOD BP:      44.3 ml RIGHT VENTRICLE         IVC TAPSE (M-mode): 1.7 cm  IVC diam: 1.70 cm LEFT ATRIUM             Index       RIGHT ATRIUM          Index LA diam:        2.90 cm 1.40 cm/m  RA Area:     9.24 cm LA Vol (A2C):   41.4 ml 20.00 ml/m RA Volume:   16.20 ml 7.83 ml/m LA Vol (A4C):   26.9 ml 13.00 ml/m LA Biplane Vol: 35.2 ml 17.01 ml/m  AORTIC VALVE LVOT Vmax:   123.00  cm/s LVOT  Vmean:  78.600 cm/s LVOT VTI:    0.218 m  AORTA Ao Root diam: 3.60 cm Ao Asc diam:  3.60 cm MITRAL VALVE MV Area (PHT): 3.21 cm     SHUNTS MV Decel Time: 236 msec     Systemic VTI:  0.22 m MV E velocity: 99.10 cm/s   Systemic Diam: 2.00 cm MV A velocity: 125.00 cm/s MV E/A ratio:  0.79 Epifanio Lesches MD Electronically signed by Epifanio Lesches MD Signature Date/Time: 01/02/2021/11:49:42 AM    Final    VAS US CAROTID (at [facility] and [facility] only)  Result Date: 01/02/2021 Carotid Arterial Duplex Study Patient Name:  Jeff [name]  Date of Exam:   01/02/2021 Medical Rec #: [id number]   Accession #:    [id number] Date of Birth: 12-23-1950   Patient Gender: M Patient Age:   70Y Exam Location:  [facility] Procedure:      VAS US CAROTID Referring Phys: [id number] [facility] --------------------------------------------------------------------------------  Indications:  TIA. Risk Factors: Hypertension, hyperlipidemia, Diabetes, prior CVA. Performing Technologist: Jean Rosenthal RDMS,RVT  Examination Guidelines: A complete evaluation includes B-mode imaging, spectral Doppler, color Doppler, and power Doppler as needed of all accessible portions of each vessel. Bilateral testing is considered an integral part of a complete examination. Limited examinations for reoccurring indications may be performed as noted.  Right Carotid Findings: +----------+--------+--------+--------+---------------------+------------------+           PSV cm/sEDV cm/sStenosisPlaque Description   Comments           +----------+--------+--------+--------+---------------------+------------------+ CCA Prox  80      16                                   intimal thickening +----------+--------+--------+--------+---------------------+------------------+ CCA Distal62      17              heterogenous and                                                          diffuse                                  +----------+--------+--------+--------+---------------------+------------------+ ICA Prox  66      25      1-39%   hypoechoic and                                                            heterogenous                            +----------+--------+--------+--------+---------------------+------------------+ ICA Distal51      21                                                      +----------+--------+--------+--------+---------------------+------------------+ ECA  79                                                              +----------+--------+--------+--------+---------------------+------------------+ +----------+--------+-------+----------------+-------------------+           PSV cm/sEDV cmsDescribe        Arm Pressure (mmHG) +----------+--------+-------+----------------+-------------------+ [id number]            Multiphasic, WNL                    +----------+--------+-------+----------------+-------------------+ +---------+--------+--+--------+--+---------+ VertebralPSV cm/s48EDV cm/s15Antegrade +---------+--------+--+--------+--+---------+  Left Carotid Findings: +----------+--------+--------+--------+---------------------+------------------+           PSV cm/sEDV cm/sStenosisPlaque Description   Comments           +----------+--------+--------+--------+---------------------+------------------+ CCA Prox  99      18                                   intimal thickening +----------+--------+--------+--------+---------------------+------------------+ CCA Distal79      16                                   intimal thickening +----------+--------+--------+--------+---------------------+------------------+ ICA Prox  67      19              hypoechoic and                                                            heterogenous                             +----------+--------+--------+--------+---------------------+------------------+ ICA Distal58      19                                                      +----------+--------+--------+--------+---------------------+------------------+ ECA       93      10                                                      +----------+--------+--------+--------+---------------------+------------------+ +----------+--------+--------+-------------------+-------------------+           PSV cm/sEDV cm/sDescribe           Arm Pressure (mmHG) +----------+--------+--------+-------------------+-------------------+ Subclavian217             Elevated velocities                    +----------+--------+--------+-------------------+-------------------+ +---------+--------+--+--------+--------------+ VertebralPSV cm/s43EDV cm/sHigh resistant +---------+--------+--+--------+--------------+   Summary: Right Carotid: Velocities in the right ICA are consistent with a 1-39% stenosis. Left Carotid: Velocities in the left ICA are consistent with a 1-39% stenosis. Vertebrals:  Right vertebral artery demonstrates  antegrade flow. Left vertebral              artery demonstrates high resistant flow. Subclavians: Normal flow hemodynamics were seen in the right subclavian artery.              High velocities seen in the left subclavian artery. *See table(s) above for measurements and observations.     Preliminary      Transthoracic Echocardiogram  00/00/2021 Pending  Bilateral Carotid Dopplers  00/00/2021 Pending  ECG - SR rate 84 BPM. (See cardiology reading for complete details)  PHYSICAL EXAM Blood pressure 117/76, pulse 65, temperature 97.7 F (36.5 C), temperature source Oral, resp. rate 15, height 5\' 8"  (1.727 m), weight 93.4 kg, SpO2 99 %. Pleasant elderly Caucasian male not in distress. . Afebrile. Head is nontraumatic. Neck is supple without bruit.    Cardiac exam no murmur or gallop. Lungs are clear to  auscultation. Distal pulses are well felt.  Neurological Exam ;  Awake  Alert oriented x 3. Normal speech and language.eye movements full without nystagmus.fundi were not visualized. Vision acuity and fields appear normal. Hearing is normal. Palatal movements are normal. Face symmetric. Tongue midline. Normal strength, tone, reflexes and coordination. Normal sensation. Gait deferred.   ASSESSMENT/PLAN Jeff [name] is a 70 y.o. male with history of diabetes, hypertension, hyperlipidemia, ADHD, renal disorder, prior stroke with some residual right-sided weakness with some gait difficulty and evaluation in October of this year for concern for staring spell-seizure versus stroke-symptoms attributed to hypoglycemic episode causing strokelike symptoms presented with word finding difficulty, slurred speech and fall due to lower extremity weakness right worse than left. He did not receive IV t-PA due to resolution of deficits.   TIA: word finding difficulty, slurred speech and fall due to lower extremity weakness right worse than left - resolved. Likely from symptomatic stenosis.MRI head - No evidence of acute intracranial abnormality.  Resultant no deficits Code Stroke CT Head - not ordered CT head - No acute intracranial process. MRI head - No evidence of acute intracranial abnormality. Re-demonstrated chronic lacunar infarct within the right basal ganglia. Moderate chronic small vessel ischemic changes within the cerebral white matter and pons, stable as compared to the brain MRI of 03/10/2020. Moderate central predominant cerebral atrophy, stable. 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 MRA head - Negative intracranial MRA for large vessel occlusion. Atherosclerotic change involving both carotid siphons with associated moderate stenosis at the anterior genu of the cavernous left ICA. Moderate stenosis about the right P1 segment. CTA H&N - not ordered CT Perfusion - not  ordered Carotid Doppler -bilateral 1-39% carotid stenosis  2D Echo -  Ejection fraction 60-65%. 09-15-1996 Virus 2 - negative LDL - 41 HgbA1c - 8.6 UDS - not ordered VTE prophylaxis - Lovenox 40 mg daily Diet  Diet Order             Diet Carb Modified Fluid consistency: Thin; Room service appropriate? Yes  Diet effective now                  clopidogrel 75 mg daily prior to admission, now on aspirin 325 mg daily and clopidogrel 75 mg daily Patient will be counseled to be compliant with his antithrombotic medications Ongoing aggressive stroke risk factor management Therapy recommendations: Outpatient PT disposition: Home Hypertension Home BP meds: Lisinopril Current BP meds: none  Stable Permissive hypertension (OK if < 220/120) but gradually normalize in 5-7 days Long-term BP goal normotensive  Hyperlipidemia Home Lipid lowering medication: Crestor  40 mg daily LDL 41,  goal < 70 Current lipid lowering medication: Crestor 40 mg daily Continue statin at discharge  Diabetes Home diabetic meds: insulin and metformin Current diabetic meds: insulin HgbA1c 8.6, goal < 7.0 Recent Labs    01/02/21 2126 01/03/21 0630 01/03/21 1156  GLUCAP 270* 155* 125*    Other Stroke Risk Factors Advanced age Obesity, Body mass index is 31.32 kg/m., recommend weight loss, diet and exercise as appropriate  Hx stroke/TIA  Other Active Problems, Findings, Recommendations and/or Plan Code status - Full code  CKD - stage 3a - creatinine - 1.56  Mild leukocytosis - WBC's - 11.8  (temp - 99.1)  Patient presented with transient speech difficulties and bilateral lower extremity weakness right greater than left and MR angiogram as well as previous CT angiogram from last admission for stroke shows severe carotid siphon stenosis however cerebral catheter angiogram showed only 50% right cavernous carotid stenosis and severe stenosis of anterior and posterior divisions of the middle cerebral  artery which cannot explain his presentation.  Hence recommend recommend aggressive medical therapy rather than angioplasty stenting at the present time.  Recommend aspirin and Brilinta for 3 months followed by aspirin alone and aggressive risk factor modification.  Long discussion with patient, his wife and Dr. Benjamine Mola,  and answered questions.  Greater than 50% time during this 25-minute visit was spent in counseling and coordination of care and discussion with care team.  Follow-up as an outpatient in the stroke clinic in 2 months or call earlier if necessary.  Stroke team will sign off.  Call for questions  Delia Heady MD   Hospital day # 0  Delia Heady, MD  To contact Stroke Continuity provider, please refer to [email or web address]. After hours, contact General Neurology

## 2021-01-03 NOTE — Sedation Documentation (Signed)
Vital signs stable. 

## 2021-01-03 NOTE — Sedation Documentation (Signed)
Procedure finished. Applying TR Band at this time

## 2021-01-03 NOTE — Plan of Care (Signed)
  Problem: Education: Goal: Knowledge of General Education information will improve Description: Including pain rating scale, medication(s)/side effects and non-pharmacologic comfort measures Outcome: Progressing   Problem: Health Behavior/Discharge Planning: Goal: Ability to manage health-related needs will improve Outcome: Progressing   Problem: Clinical Measurements: Goal: Ability to maintain clinical measurements within normal limits will improve Outcome: Progressing Goal: Will remain free from infection Outcome: Progressing Goal: Diagnostic test results will improve Outcome: Progressing Goal: Respiratory complications will improve Outcome: Progressing Goal: Cardiovascular complication will be avoided Outcome: Progressing   Problem: Activity: Goal: Risk for activity intolerance will decrease Outcome: Progressing   Problem: Nutrition: Goal: Adequate nutrition will be maintained Outcome: Progressing   Problem: Coping: Goal: Level of anxiety will decrease Outcome: Progressing   Problem: Elimination: Goal: Will not experience complications related to bowel motility Outcome: Progressing Goal: Will not experience complications related to urinary retention Outcome: Progressing   Problem: Pain Managment: Goal: General experience of comfort will improve Outcome: Progressing   Problem: Safety: Goal: Ability to remain free from injury will improve Outcome: Progressing   Problem: Skin Integrity: Goal: Risk for impaired skin integrity will decrease Outcome: Progressing   Problem: Education: Goal: Knowledge of disease or condition will improve Outcome: Progressing Goal: Knowledge of secondary prevention will improve Outcome: Progressing Goal: Knowledge of patient specific risk factors addressed and post discharge goals established will improve Outcome: Progressing Goal: Individualized Educational Video(s) Outcome: Progressing   Problem: Coping: Goal: Will verbalize  positive feelings about self Outcome: Progressing   Problem: Health Behavior/Discharge Planning: Goal: Ability to manage health-related needs will improve Outcome: Progressing   Problem: Self-Care: Goal: Ability to participate in self-care as condition permits will improve Outcome: Progressing Goal: Ability to communicate needs accurately will improve Outcome: Progressing   Problem: Nutrition: Goal: Risk of aspiration will decrease Outcome: Progressing   Problem: Ischemic Stroke/TIA Tissue Perfusion: Goal: Complications of ischemic stroke/TIA will be minimized Outcome: Progressing

## 2021-01-03 NOTE — Sedation Documentation (Signed)
Pt tolerated procedure very well. Vitals are stable.  Totals: Time: 43 mins Fentanyl Versed 2 mg  10cc air in TR Band at 0943, pulse 2+ right radial artery, level 0  Report called to 3W RN

## 2021-01-03 NOTE — Sedation Documentation (Signed)
Vital signs stable. Procedure started 

## 2021-01-03 NOTE — Sedation Documentation (Signed)
Patient is resting comfortably. Pt is asleep. Procedure continues

## 2021-01-03 NOTE — TOC Transition Note (Signed)
Transition of Care [facility]) - CM/SW Discharge Note   Patient Details  Name: Jeff [name] MRN: [id number] Date of Birth: 07-28-1950  Transition of Care [facility]) CM/SW Contact:  Kermit Balo, RN Phone Number: 01/03/2021, 3:13 PM   Clinical Narrative:    Pt discharging home with outpatient therapy through [facility]. Information on the AVS.  Pt's d/c meds to be delivered to the room per [facility] pharmacy.  Pt has transport home.   Final next level of care: OP Rehab Barriers to Discharge: No Barriers Identified   Patient Goals and CMS Choice   CMS Medicare.gov Compare Post Acute Care list provided to:: (P) Patient Choice offered to / list presented to : Patient  Discharge Placement                       Discharge Plan and Services   Discharge Planning Services: CM Consult                                 Social Determinants of Health (SDOH) Interventions     Readmission Risk Interventions No flowsheet data found.

## 2021-01-03 NOTE — Progress Notes (Signed)
Inpatient Diabetes Program Recommendations  AACE/ADA: New Consensus Statement on Inpatient Glycemic Control   Target Ranges:  Prepandial:   less than 140 mg/dL      Peak postprandial:   less than 180 mg/dL (1-2 hours)      Critically ill patients:  140 - 180 mg/dL   Results for TORI, CUPPS (MRN [id number]) as of 01/03/2021 08:05  Ref. Range 01/02/2021 06:09 01/02/2021 12:11 01/02/2021 19:07 01/02/2021 21:26 01/03/2021 06:30  Glucose-Capillary Latest Ref Range: 70 - 99 mg/dL 017 (H) 793 (H) 903 (H) 270 (H) 155 (H)    Review of Glycemic Control  Diabetes history: DM2 Outpatient Diabetes medications: Lantus 45 units QAM, Lantus 55 units QHS, Novolog 23 units TID with meals (if CBG >120 mg/dl), Metformin 009 mg BID Current orders for Inpatient glycemic control: Semglee 30 units BID, Novolog 0-20 units TID with meals, Novolog 0-5 units QHS  Inpatient Diabetes Program Recommendations:    Insulin: Please consider increasing Semglee to 35 units QAM (continue Semglee 30 units QHS) and once diet is resumed ordering Novolog 5 units TID with meals for meal coverage if patient eats at least 50% of meals.  Thanks, Orlando Penner, RN, MSN, CDE Diabetes Coordinator Inpatient Diabetes Program [phone] (Team Pager from 8am to 5pm)

## 2021-01-05 ENCOUNTER — Other Ambulatory Visit (HOSPITAL_BASED_OUTPATIENT_CLINIC_OR_DEPARTMENT_OTHER): Payer: Self-pay

## 2021-01-09 ENCOUNTER — Other Ambulatory Visit (HOSPITAL_COMMUNITY): Payer: Self-pay | Admitting: Interventional Radiology

## 2021-01-09 ENCOUNTER — Other Ambulatory Visit (HOSPITAL_BASED_OUTPATIENT_CLINIC_OR_DEPARTMENT_OTHER): Payer: Self-pay

## 2021-01-09 DIAGNOSIS — I771 Stricture of artery: Secondary | ICD-10-CM

## 2021-01-12 ENCOUNTER — Other Ambulatory Visit: Payer: Self-pay

## 2021-01-12 ENCOUNTER — Ambulatory Visit (HOSPITAL_COMMUNITY)
Admission: RE | Admit: 2021-01-12 | Discharge: 2021-01-12 | Disposition: A | Discharge status: Home / Self Care | Payer: No Typology Code available for payment source | Source: Ambulatory Visit | Attending: Interventional Radiology | Admitting: Interventional Radiology

## 2021-01-12 DIAGNOSIS — I63232 Cerebral infarction due to unspecified occlusion or stenosis of left carotid arteries: Secondary | ICD-10-CM | POA: Diagnosis not present

## 2021-01-12 DIAGNOSIS — I6322 Cerebral infarction due to unspecified occlusion or stenosis of basilar arteries: Secondary | ICD-10-CM | POA: Diagnosis not present

## 2021-01-12 DIAGNOSIS — Z7902 Long term (current) use of antithrombotics/antiplatelets: Secondary | ICD-10-CM | POA: Diagnosis not present

## 2021-01-12 DIAGNOSIS — I771 Stricture of artery: Secondary | ICD-10-CM

## 2021-01-12 DIAGNOSIS — I63512 Cerebral infarction due to unspecified occlusion or stenosis of left middle cerebral artery: Secondary | ICD-10-CM | POA: Diagnosis not present

## 2021-01-13 HISTORY — PX: IR RADIOLOGIST EVAL & MGMT: IMG5224

## 2021-02-13 ENCOUNTER — Encounter: Payer: Self-pay | Admitting: Physical Therapy

## 2021-02-13 ENCOUNTER — Other Ambulatory Visit: Payer: Self-pay

## 2021-02-13 ENCOUNTER — Ambulatory Visit: Payer: No Typology Code available for payment source | Attending: Internal Medicine | Admitting: Physical Therapy

## 2021-02-13 DIAGNOSIS — R293 Abnormal posture: Secondary | ICD-10-CM | POA: Insufficient documentation

## 2021-02-13 DIAGNOSIS — R2681 Unsteadiness on feet: Secondary | ICD-10-CM

## 2021-02-13 DIAGNOSIS — I69351 Hemiplegia and hemiparesis following cerebral infarction affecting right dominant side: Secondary | ICD-10-CM | POA: Insufficient documentation

## 2021-02-13 DIAGNOSIS — R2689 Other abnormalities of gait and mobility: Secondary | ICD-10-CM | POA: Diagnosis not present

## 2021-02-14 NOTE — Therapy (Signed)
[facility] [address] [place], [place], [address] Phone: [phone]   Fax:  [phone]  Physical Therapy Evaluation  Patient Details  Name: Jeff [name] MRN: [id number] Date of Birth: 04-02-1951 Referring Provider (PT): Marlin Canary, DO   Encounter Date: 02/13/2021   PT End of Session - 02/14/21 1902     Visit Number 1    Number of Visits 15   15 visits authorized by [facility]   Date for PT Re-Evaluation 04/07/21    Authorization Type VA - 15 visits    Authorization Time Period 02-14-21 - 04-16-21    Authorization - Visit Number 1    Authorization - Number of Visits 15    PT Start Time 0803    PT Stop Time 0847    PT Time Calculation (min) 44 min    Activity Tolerance Patient tolerated treatment well    Behavior During Therapy [facility] for tasks assessed/performed             Past Medical History:  Diagnosis Date   ADHD (attention deficit hyperactivity disorder)    Diabetes mellitus    Hyperlipidemia    Hypertension    Renal disorder    Stroke [facility])     Past Surgical History:  Procedure Laterality Date   IR ANGIO INTRA EXTRACRAN SEL COM CAROTID INNOMINATE BILAT MOD SED  01/03/2021   IR ANGIO VERTEBRAL SEL SUBCLAVIAN INNOMINATE UNI L MOD SED  01/03/2021   IR ANGIO VERTEBRAL SEL VERTEBRAL UNI R MOD SED  01/03/2021   IR RADIOLOGIST EVAL & MGMT  01/13/2021   IR US GUIDE VASC ACCESS RIGHT  01/03/2021   NECK SURGERY     SPINE SURGERY     Cervical spine x 2; Vear Clock; Critzer.    There were no vitals filed for this visit.        [facility] PT Assessment - 02/14/21 0001       Assessment   Medical Diagnosis s/p TIA:  Gait abnormality    Referring Provider (PT) Marlin Canary, DO    Onset Date/Surgical Date 01/01/21    Hand Dominance Right    Prior Therapy pt had PT at this facility      Precautions   Precautions Fall      Restrictions   Weight Bearing Restrictions No      Balance Screen   Has the patient fallen  in the past 6 months Yes    How many times? 7-10    Has the patient had a decrease in activity level because of a fear of falling?  Yes    Is the patient reluctant to leave their home because of a fear of falling?  No      Home Environment   Living Environment Private residence    Living Arrangements Spouse/significant other    Type of Home House    Home Access Stairs to enter    Entrance Stairs-Number of Steps 2    Entrance Stairs-Rails None    Home Layout One level    Home Equipment Walker - 2 wheels;Cane - single point    Additional Comments pt is not using assistive device at this time      Prior Function   Level of Independence Independent   does not drive due to retinopathy (since March 2020)   Vocation Retired      Observation/Other Assessments   Focus on Therapeutic Outcomes (FOTO)  Stroke LE score 53/100: risk adjusted 50/100  ROM / Strength   AROM / PROM / Strength Strength      Strength   Overall Strength Deficits    Overall Strength Comments RLE is grossly 4- - 4/5; LLE is 5/5 throughout      Ambulation/Gait   Ambulation/Gait Yes    Ambulation/Gait Assistance 5: Supervision    Ambulation/Gait Assistance Details pt amb. with trunk flexed and also slight lateral trunk flexion ot Rt side    Ambulation Distance (Feet) 50 Feet    Assistive device None    Gait Pattern Step-through pattern    Ambulation Surface Level;Indoor    Gait velocity 27.81 secs = 1.18 ft/sec without device      Standardized Balance Assessment   Standardized Balance Assessment Berg Balance Test;Timed Up and Go Test      Berg Balance Test   Sit to Stand Able to stand  independently using hands    Standing Unsupported Able to stand 2 minutes with supervision    Sitting with Back Unsupported but Feet Supported on Floor or Stool Able to sit safely and securely 2 minutes    Stand to Sit Controls descent by using hands    Transfers Able to transfer safely, definite need of hands    Standing  Unsupported with Eyes Closed Able to stand 10 seconds with supervision    Standing Unsupported with Feet Together Able to place feet together independently but unable to hold for 30 seconds    From Standing, Reach Forward with Outstretched Arm Can reach forward >12 cm safely (5")    From Standing Position, Pick up Object from Floor Able to pick up shoe, needs supervision    From Standing Position, Turn to Look Behind Over each Shoulder Needs supervision when turning    Turn 360 Degrees Needs close supervision or verbal cueing    Standing Unsupported, Alternately Place Feet on Step/Stool Able to complete >2 steps/needs minimal assist    Standing Unsupported, One Foot in Front Able to take small step independently and hold 30 seconds    Standing on One Leg Tries to lift leg/unable to hold 3 seconds but remains standing independently    Total Score 33      Timed Up and Go Test   TUG Normal TUG    Normal TUG (seconds) 28.12                        Objective measurements completed on examination: See above findings.                PT Education - 02/14/21 1859     Education Details results of eval - discussed POC to include land and aquatic sessions    Person(s) Educated Patient;Spouse    Methods Explanation    Comprehension Verbalized understanding              PT Short Term Goals - 02/14/21 1918       PT SHORT TERM GOAL #1   Title Improve Berg score from 33/56 to >/= 37/56 for reduced fall risk.    Baseline 33/56 on 02-14-21    Time 4    Period Weeks    Status New    Target Date 03/17/21      PT SHORT TERM GOAL #2   Title Improve TUG score from 28.12 secs to </= 23 secs with no device for decr. fall risk.    Baseline 28.12 secs without device    Time 4  Period Weeks    Status New    Target Date 03/17/21      PT SHORT TERM GOAL #3   Title Incr. gait velocity from 1.18 ft/sec to >/= 1.5 ft/sec for incr. gait efficiency (without device).     Baseline 27.81 secs = 1.18 ft/sec without device    Time 4    Period Weeks    Status New    Target Date 03/17/21      PT SHORT TERM GOAL #4   Title Pt will perform sit to stand from mat table with 1 UE support to demo improved LE strength.    Time 4    Period Weeks    Status New    Target Date 03/17/21      PT SHORT TERM GOAL #5   Title Independent in HEP for balance & strengthening exs.    Time 4    Period Weeks    Status New    Target Date 03/17/21               PT Long Term Goals - 02/14/21 1926       PT LONG TERM GOAL #1   Title Increase Berg score to >/= 41/56 for reduced fall risk.    Baseline 33/56 on 02-13-21    Time 8    Period Weeks    Status New    Target Date 04/14/21      PT LONG TERM GOAL #2   Title Improve TUG score from 28.12 secs to </= 20 secs without device to reduce fall risk.    Baseline 28.12 secs without device    Time 8    Period Weeks    Status New    Target Date 04/14/21      PT LONG TERM GOAL #3   Title Pt will amb. 500' with SPC on even and uneven surfaces with supervision for incr. community accessibility.    Time 8    Period Weeks    Status New    Target Date 04/14/21      PT LONG TERM GOAL #4   Title Perform sit to stand from mat table without UE support to demo improved LE strength.    Time 8    Period Weeks    Status New    Target Date 04/14/21      PT LONG TERM GOAL #5   Title Independent in updated HEP for balance and strengthening.    Time 8    Period Weeks    Status New    Target Date 04/14/21      Additional Long Term Goals   Additional Long Term Goals Yes      PT LONG TERM GOAL #6   Title Increase Foto score from 53/100 to >/= 60/100 to demo improvement in functional status.    Baseline 53/100 on 02-13-21    Time 8    Period Weeks    Status New    Target Date 04/14/21                    Plan - 02/14/21 1905     Clinical Impression Statement Pt is a 70 yr old gentleman s/p TIA on 01-01-21;  pt presented to [facility] ED with bil. LE weakness with RLE weaker than LLE and with transient speech difficulties.  Pt was admitted to [facility] and discharged home on 01-03-21.  Pt has received OP PT at this facility in the past (01-15-19 - 05-11-19)  due to CVA sustained in March 2020.  PMH includes h/o prior CVA (March 2020), type II DM, CKD stage 3, HTN, h/o cervical fusion C5-7 (2012) and h/o neck injury in 2016.  Pt presents with balance and gait deficits and is at high risk for fall per TUG score of 28.12 secs without device and Berg score 33/56.  Pt was instructed to use his SPC to reduce fall risk at this time.  Pt is requesting combination of land and aquatic pool sessions.    Personal Factors and Comorbidities Age;Fitness;Comorbidity 2;Transportation;Past/Current Experience    Comorbidities h/o cervical fusion C5-7 (2012), h/o neck injury 2016, h/o prior CVA in March 2020, DM type II, HTN, stage 3 CKD    Examination-Activity Limitations Locomotion Level;Transfers;Bend;Stairs;Stand;Lift;Squat    Examination-Participation Restrictions Church;Cleaning;Meal Prep;Driving;Community Activity;Interpersonal Relationship;Laundry;Shop;Yard Work    Conservation officer, historic buildings Evolving/Moderate complexity    Clinical Decision Making Moderate    Rehab Potential Good    PT Frequency 2x / week    PT Duration 8 weeks    PT Treatment/Interventions ADLs/Self Care Home Management;Aquatic Therapy;Gait training;Stair training;Therapeutic activities;Therapeutic exercise;Balance training;DME Instruction;Neuromuscular re-education;Patient/family education    PT Next Visit Plan issue balance HEP and sit to stand exercise; gait train with [facility]    PT Home Exercise Plan balance and sit to stand    Consulted and Agree with Plan of Care Patient;Family member/caregiver             Patient will benefit from skilled therapeutic intervention in order to improve the following deficits and impairments:  Decreased balance,  Decreased endurance, Decreased strength, Difficulty walking, Pain, Postural dysfunction  Visit Diagnosis: Other abnormalities of gait and mobility - Plan: PT plan of care cert/re-cert  Hemiplegia and hemiparesis following cerebral infarction affecting right dominant side (HCC) - Plan: PT plan of care cert/re-cert  Unsteadiness on feet - Plan: PT plan of care cert/re-cert  Abnormal posture - Plan: PT plan of care cert/re-cert     Problem List Patient Active Problem List   Diagnosis Date Noted   TIA (transient ischemic attack) 03/10/2020   CKD (chronic kidney disease) 08/28/2018   Retinopathy of both eyes 08/28/2018   Stroke due to embolism of left vertebral artery (HCC) 08/12/2018   Stroke (HCC) 08/08/2018   Diabetes mellitus (HCC) 07/25/2015   Neck injury 06/22/2015   Hearing loss in right ear 10/14/2011   Agent orange exposure 10/14/2011   ADD (attention deficit disorder) 10/14/2011   Hyperlipidemia 10/14/2011   BMI 28.0-28.9,adult 10/14/2011    Georgi Tuel, Donavan Burnet, PT 02/14/2021, 7:46 PM  [facility] [address] [place], [place], [address] Phone: [phone]   Fax:  [phone]  Name: Jeff [name] MRN: [id number] Date of Birth: 1950-07-13

## 2021-02-20 ENCOUNTER — Other Ambulatory Visit: Payer: Self-pay

## 2021-02-20 ENCOUNTER — Ambulatory Visit: Payer: Self-pay | Admitting: Physical Therapy

## 2021-02-20 DIAGNOSIS — I69351 Hemiplegia and hemiparesis following cerebral infarction affecting right dominant side: Secondary | ICD-10-CM

## 2021-02-20 DIAGNOSIS — R2689 Other abnormalities of gait and mobility: Secondary | ICD-10-CM

## 2021-02-20 DIAGNOSIS — R2681 Unsteadiness on feet: Secondary | ICD-10-CM

## 2021-02-21 ENCOUNTER — Ambulatory Visit: Payer: No Typology Code available for payment source | Admitting: Physical Therapy

## 2021-02-21 DIAGNOSIS — R2689 Other abnormalities of gait and mobility: Secondary | ICD-10-CM | POA: Diagnosis not present

## 2021-02-21 DIAGNOSIS — R2681 Unsteadiness on feet: Secondary | ICD-10-CM

## 2021-02-21 DIAGNOSIS — R293 Abnormal posture: Secondary | ICD-10-CM

## 2021-02-21 NOTE — Therapy (Signed)
[facility] [address] [place], [place], [address] Phone: [phone]   Fax:  [phone]  Physical Therapy Treatment  Patient Details  Name: Jeff [name] MRN: [id number] Date of Birth: [birth year] Referring Provider (PT): Marlin Canary, DO   Encounter Date: 02/20/2021   PT End of Session - 02/21/21 1048     Visit Number 2    Number of Visits 15   15 visits authorized by [facility]   Date for PT Re-Evaluation 04/07/21    Authorization Type VA - 15 visits    Authorization Time Period 02-14-21 - 04-16-21    Authorization - Visit Number 2    Authorization - Number of Visits 15    PT Start Time 1415    PT Stop Time 1446   pt requested to end session early due to fatigue   PT Time Calculation (min) 31 min    Equipment Utilized During Treatment Other (comment)   pool noodle, large barbell   Activity Tolerance Patient tolerated treatment well    Behavior During Therapy WFL for tasks assessed/performed             Past Medical History:  Diagnosis Date   ADHD (attention deficit hyperactivity disorder)    Diabetes mellitus    Hyperlipidemia    Hypertension    Renal disorder    Stroke [facility])     Past Surgical History:  Procedure Laterality Date   IR ANGIO INTRA EXTRACRAN SEL COM CAROTID INNOMINATE BILAT MOD SED  01/03/2021   IR ANGIO VERTEBRAL SEL SUBCLAVIAN INNOMINATE UNI L MOD SED  01/03/2021   IR ANGIO VERTEBRAL SEL VERTEBRAL UNI R MOD SED  01/03/2021   IR RADIOLOGIST EVAL & MGMT  01/13/2021   IR US GUIDE VASC ACCESS RIGHT  01/03/2021   NECK SURGERY     SPINE SURGERY     Cervical spine x 2; Vear Clock; Critzer.    There were no vitals filed for this visit.   Subjective Assessment - 02/21/21 1046     Subjective Pt reports for first aquatic therapy session at [facility]    Patient is accompained by: Family member   wife [name]   Pertinent History h/o prior CVA (08-08-18):  h/o cervical fusion C5-7 (2012): h/o neck injury 2016:  DM type II, HTN,  stage 3 CKD    Limitations Walking;House hold activities;Lifting    Diagnostic tests MRI and MRA on 01-01-21    Patient Stated Goals Improve balance; walk without falling    Currently in Pain? No/denies    Pain Onset 1 to 4 weeks ago              Aquatic therapy at [facility] - water temp 94 degrees  Patient seen for aquatic therapy today.  Treatment took place in water 3.5-4.5 feet deep depending upon activity.  Pt entered  & exited the pool via step negotiation with use of bil. Hand rails with supervision.  Pt entered pool approx. 10" prior to official start of session and exercised/performed water walking holding onto side of pool prn for assist with balance  At start of session - pt performed stretching for hamstrings and gastrocs - Runner's stretch RLE & LLE 30 sec hold x 1 rep each leg  Marching in place 10 reps each leg with UE support on side pool prn for balance recovery  Pt performed marching forwards across pool 18' x 2 reps; marching backwards 18' x 1 rep  Pt performed standing alternating forward kicks,  backward kicks and side kicks 10 reps each leg with UE support on side of pool prn  Sidestepping 18' x 2 reps across pool; 18' x 1 rep with pt performing sidestepping with squat  Pt received notification from his phone that his blood sugar level was increasing (read 246) so pt sat on pool steps and rested for approx. 2" and then was able to resume exercises; blood sugar level had dropped to 187 at end of session  Pt performed forward ambulation with cues for arm swing 18' x 4 reps without UE support with SBA  Pt requires buoyancy of water for support for safety with balance exercises with reduced fall risk in aquatic environment compared to that on land Viscosity of water needed for strengthening RLE; Current of water provided perturbations with dynamic gait training and balance exercises.                                  PT Short Term Goals - 02/21/21 1057       PT SHORT TERM GOAL #1   Title Improve Berg score from 33/56 to >/= 37/56 for reduced fall risk.    Baseline 33/56 on 02-14-21    Time 4    Period Weeks    Status New    Target Date 03/17/21      PT SHORT TERM GOAL #2   Title Improve TUG score from 28.12 secs to </= 23 secs with no device for decr. fall risk.    Baseline 28.12 secs without device    Time 4    Period Weeks    Status New    Target Date 03/17/21      PT SHORT TERM GOAL #3   Title Incr. gait velocity from 1.18 ft/sec to >/= 1.5 ft/sec for incr. gait efficiency (without device).    Baseline 27.81 secs = 1.18 ft/sec without device    Time 4    Period Weeks    Status New    Target Date 03/17/21      PT SHORT TERM GOAL #4   Title Pt will perform sit to stand from mat table with 1 UE support to demo improved LE strength.    Time 4    Period Weeks    Status New    Target Date 03/17/21      PT SHORT TERM GOAL #5   Title Independent in HEP for balance & strengthening exs.    Time 4    Period Weeks    Status New    Target Date 03/17/21               PT Long Term Goals - 02/21/21 1057       PT LONG TERM GOAL #1   Title Increase Berg score to >/= 41/56 for reduced fall risk.    Baseline 33/56 on 02-13-21    Time 8    Period Weeks    Status New      PT LONG TERM GOAL #2   Title Improve TUG score from 28.12 secs to </= 20 secs without device to reduce fall risk.    Baseline 28.12 secs without device    Time 8    Period Weeks    Status New      PT LONG TERM GOAL #3   Title Pt will amb. 500' with SPC on even and uneven surfaces with supervision for incr. community accessibility.  Time 8    Period Weeks    Status New      PT LONG TERM GOAL #4   Title Perform sit to stand from mat table without UE support to demo improved LE strength.    Time 8    Period Weeks    Status New      PT LONG TERM GOAL #5   Title Independent in updated HEP for balance and  strengthening.    Time 8    Period Weeks    Status New      PT LONG TERM GOAL #6   Title Increase Foto score from 53/100 to >/= 60/100 to demo improvement in functional status.    Baseline 53/100 on 02-13-21    Time 8    Period Weeks    Status New                   Plan - 02/21/21 1050     Clinical Impression Statement Pt fatigued quickly with aquatic exercise and required frequent short rest breaks. Pt's sugar level increased (231) after approx. 15" exercise (was asymptomatic but alert was received on his phone) - pt sat for approx. 2" on steps in pool and sugar level decreased to 186 and pt was able to resume exercise.  Pt needed UE support on pool edge for assist with balance.  Pt demonstrates decreased SLS on RLE compared to that on LLE.  Cont with POC.    Personal Factors and Comorbidities Age;Fitness;Comorbidity 2;Transportation;Past/Current Experience    Comorbidities h/o cervical fusion C5-7 (2012), h/o neck injury 2016, h/o prior CVA in March 2020, DM type II, HTN, stage 3 CKD    Examination-Activity Limitations Locomotion Level;Transfers;Bend;Stairs;Stand;Lift;Squat    Examination-Participation Restrictions Church;Cleaning;Meal Prep;Driving;Community Activity;Interpersonal Relationship;Laundry;Shop;Yard Work    Conservation officer, historic buildings Evolving/Moderate complexity    Rehab Potential Good    PT Frequency 2x / week    PT Duration 8 weeks    PT Treatment/Interventions ADLs/Self Care Home Management;Aquatic Therapy;Gait training;Stair training;Therapeutic activities;Therapeutic exercise;Balance training;DME Instruction;Neuromuscular re-education;Patient/family education    PT Next Visit Plan Aquatic exercise - balance and gait - pt fatigues quickly ; cont balance and gait training - without device in clinic; (I will issue balance HEP from 02-21-21 session)    PT Home Exercise Plan balance and sit to stand    Consulted and Agree with Plan of Care Patient;Family  member/caregiver    Family Member Consulted [name]             Patient will benefit from skilled therapeutic intervention in order to improve the following deficits and impairments:  Decreased balance, Decreased endurance, Decreased strength, Difficulty walking, Pain, Postural dysfunction  Visit Diagnosis: Other abnormalities of gait and mobility  Hemiplegia and hemiparesis following cerebral infarction affecting right dominant side (HCC)  Unsteadiness on feet     Problem List Patient Active Problem List   Diagnosis Date Noted   TIA (transient ischemic attack) 03/10/2020   CKD (chronic kidney disease) 08/28/2018   Retinopathy of both eyes 08/28/2018   Stroke due to embolism of left vertebral artery (HCC) 08/12/2018   Stroke (HCC) 08/08/2018   Diabetes mellitus (HCC) 07/25/2015   Neck injury 06/22/2015   Hearing loss in right ear 10/14/2011   Agent orange exposure 10/14/2011   ADD (attention deficit disorder) 10/14/2011   Hyperlipidemia 10/14/2011   BMI 28.0-28.9,adult 10/14/2011    Aariyah Sampey, Donavan Burnet, PT, ATRIC 02/21/2021, 12:52 PM  [facility] [address] [place], [place], [address] Phone: [phone]   Fax:  [phone]  Name: LAKAI MOREE MRN: [id number] Date of Birth: 08/06/1950

## 2021-02-22 NOTE — Therapy (Signed)
[facility] [address] [place], [place], [address] Phone: [phone]   Fax:  [phone]  Physical Therapy Treatment  Patient Details  Name: Jeff [name] MRN: [id number] Date of Birth: 07-20-1950 Referring Provider (PT): Marlin Canary, DO   Encounter Date: 02/21/2021   PT End of Session - 02/22/21 0717     Visit Number 3    Number of Visits 15   15 visits authorized by [facility]   Date for PT Re-Evaluation 04/07/21    Authorization Type VA - 15 visits    Authorization Time Period 02-14-21 - 04-16-21    Authorization - Visit Number 3    Authorization - Number of Visits 15    PT Start Time 0802    PT Stop Time 0846    PT Time Calculation (min) 44 min    Equipment Utilized During Treatment Gait belt   pool noodle, large barbell   Activity Tolerance Patient tolerated treatment well    Behavior During Therapy WFL for tasks assessed/performed             Past Medical History:  Diagnosis Date   ADHD (attention deficit hyperactivity disorder)    Diabetes mellitus    Hyperlipidemia    Hypertension    Renal disorder    Stroke [facility])     Past Surgical History:  Procedure Laterality Date   IR ANGIO INTRA EXTRACRAN SEL COM CAROTID INNOMINATE BILAT MOD SED  01/03/2021   IR ANGIO VERTEBRAL SEL SUBCLAVIAN INNOMINATE UNI L MOD SED  01/03/2021   IR ANGIO VERTEBRAL SEL VERTEBRAL UNI R MOD SED  01/03/2021   IR RADIOLOGIST EVAL & MGMT  01/13/2021   IR US GUIDE VASC ACCESS RIGHT  01/03/2021   NECK SURGERY     SPINE SURGERY     Cervical spine x 2; Vear Clock; Critzer.    There were no vitals filed for this visit.   Subjective Assessment - 02/22/21 0701     Subjective Pt states he is very sore after doing pool therapy yesterday - states he is not used to doing that much exercise    Patient Stated Goals Improve balance; walk without falling    Currently in Pain? No/denies                               [facility] Adult  PT Treatment/Exercise - 02/22/21 0001       Transfers   Transfers Sit to Stand;Stand to Sit    Sit to Stand 5: Supervision    Stand to Sit 4: Min guard    Number of Reps Other reps (comment)   3 reps   Comments no UE support used      Ambulation/Gait   Ambulation/Gait Yes    Ambulation/Gait Assistance 5: Supervision    Ambulation/Gait Assistance Details cues for upright posture and to increase initial heel contact, especially on RLE    Ambulation Distance (Feet) 115 Feet    Assistive device None    Gait Pattern Step-through pattern    Ambulation Surface Level;Indoor      Exercises   Exercises Knee/Hip      Knee/Hip Exercises: Stretches   Active Hamstring Stretch Right;Left;1 rep;30 seconds   Runner's stretch   Gastroc Stretch Right;Left;1 rep;30 seconds      Knee/Hip Exercises: Aerobic   Nustep 5", level 3 - with LE's only      Knee/Hip Exercises: Standing   Heel  Raises Both;1 set;10 reps   RLE only 10 reps with bil. UE support on counter                Balance Exercises - 02/22/21 0001       Balance Exercises: Standing   SLS Eyes open;Solid surface;2 reps;Upper extremity support 1   10 sec hold   Sidestepping 2 reps   minimal UE support on counter   Marching Solid surface;Static;10 reps    Other Standing Exercises Pt performed standing alternating forward, back and side kicks 10 reps each leg; crossovers front along counter 4 reps; stepping behind 4 reps along counter;    Other Standing Exercises Comments Pt performed standing with feet together - 15 sec hold EO:  10 sec hold EC with CGA:  pt performed horizontal head turns 5 reps with UE support; vertical head turns 5 reps with UE support on counter as needed;  pt performed alternating tap ups to 1st step 10 reps each leg; to 2nd step 5 reps each with UE support prn with CGA            Pt performed stepping over/back of lower orange hurdle - 5 reps each leg - with UE support on counter  Pt performed SLS  activity - touching 3 balance bubbles with each leg in various sequences  - with UE support on Counter and also with CGA to min assist for balance recovery     PT Education - 02/22/21 0717     Education Details balance HEP    Person(s) Educated Patient;Spouse    Methods Explanation;Demonstration;Handout    Comprehension Verbalized understanding;Returned demonstration              PT Short Term Goals - 02/22/21 0723       PT SHORT TERM GOAL #1   Title Improve Berg score from 33/56 to >/= 37/56 for reduced fall risk.    Baseline 33/56 on 02-14-21    Time 4    Period Weeks    Status New    Target Date 03/17/21      PT SHORT TERM GOAL #2   Title Improve TUG score from 28.12 secs to </= 23 secs with no device for decr. fall risk.    Baseline 28.12 secs without device    Time 4    Period Weeks    Status New    Target Date 03/17/21      PT SHORT TERM GOAL #3   Title Incr. gait velocity from 1.18 ft/sec to >/= 1.5 ft/sec for incr. gait efficiency (without device).    Baseline 27.81 secs = 1.18 ft/sec without device    Time 4    Period Weeks    Status New    Target Date 03/17/21      PT SHORT TERM GOAL #4   Title Pt will perform sit to stand from mat table with 1 UE support to demo improved LE strength.    Time 4    Period Weeks    Status New    Target Date 03/17/21      PT SHORT TERM GOAL #5   Title Independent in HEP for balance & strengthening exs.    Time 4    Period Weeks    Status New    Target Date 03/17/21               PT Long Term Goals - 02/22/21 0723       PT LONG TERM GOAL #  1   Title Increase Berg score to >/= 41/56 for reduced fall risk.    Baseline 33/56 on 02-13-21    Time 8    Period Weeks    Status New      PT LONG TERM GOAL #2   Title Improve TUG score from 28.12 secs to </= 20 secs without device to reduce fall risk.    Baseline 28.12 secs without device    Time 8    Period Weeks    Status New      PT LONG TERM GOAL #3    Title Pt will amb. 500' with SPC on even and uneven surfaces with supervision for incr. community accessibility.    Time 8    Period Weeks    Status New      PT LONG TERM GOAL #4   Title Perform sit to stand from mat table without UE support to demo improved LE strength.    Time 8    Period Weeks    Status New      PT LONG TERM GOAL #5   Title Independent in updated HEP for balance and strengthening.    Time 8    Period Weeks    Status New      PT LONG TERM GOAL #6   Title Increase Foto score from 53/100 to >/= 60/100 to demo improvement in functional status.    Baseline 53/100 on 02-13-21    Time 8    Period Weeks    Status New                   Plan - 02/22/21 0539     Clinical Impression Statement Pt using SBQC (as requested) for assistance with amb. to today's session; pt continues to have decreased endurance/activity tolerance and requires frequent short seated rest periods.  Pt performed gait training without use of SBQC and had no significant LOB; pt did need cues to stand erect to reduce Rt lateral trunk lean and to increase Rt heel contact at initial stance to reduce slight shuffling gait pattern.  UE support needed with balance exercises for safety.  Cont with POC.    Personal Factors and Comorbidities Age;Fitness;Comorbidity 2;Transportation;Past/Current Experience    Comorbidities h/o cervical fusion C5-7 (2012), h/o neck injury 2016, h/o prior CVA in March 2020, DM type II, HTN, stage 3 CKD    Examination-Activity Limitations Locomotion Level;Transfers;Bend;Stairs;Stand;Lift;Squat    Examination-Participation Restrictions Church;Cleaning;Meal Prep;Driving;Community Activity;Interpersonal Relationship;Laundry;Shop;Yard Work    Conservation officer, historic buildings Evolving/Moderate complexity    Rehab Potential Good    PT Frequency 2x / week    PT Duration 8 weeks    PT Treatment/Interventions ADLs/Self Care Home Management;Aquatic Therapy;Gait training;Stair  training;Therapeutic activities;Therapeutic exercise;Balance training;DME Instruction;Neuromuscular re-education;Patient/family education    PT Next Visit Plan Aquatic exercise - balance and gait - pt fatigues quickly ; cont balance and gait training - without device in clinic    PT Home Exercise Plan balance and sit to stand    Consulted and Agree with Plan of Care Patient;Family member/caregiver    Family Member Consulted [name]             Patient will benefit from skilled therapeutic intervention in order to improve the following deficits and impairments:  Decreased balance, Decreased endurance, Decreased strength, Difficulty walking, Pain, Postural dysfunction  Visit Diagnosis: Unsteadiness on feet  Other abnormalities of gait and mobility  Abnormal posture     Problem List Patient Active Problem List  Diagnosis Date Noted   TIA (transient ischemic attack) 03/10/2020   CKD (chronic kidney disease) 08/28/2018   Retinopathy of both eyes 08/28/2018   Stroke due to embolism of left vertebral artery (HCC) 08/12/2018   Stroke (HCC) 08/08/2018   Diabetes mellitus (HCC) 07/25/2015   Neck injury 06/22/2015   Hearing loss in right ear 10/14/2011   Agent orange exposure 10/14/2011   ADD (attention deficit disorder) 10/14/2011   Hyperlipidemia 10/14/2011   BMI 28.0-28.9,adult 10/14/2011    Yeison Sippel, Donavan Burnet, PT 02/22/2021, 7:24 AM  [facility] [address] [place], [place], [address] Phone: [phone]   Fax:  [phone]  Name: JERMY COUPER MRN: [id number] Date of Birth: 17-Jan-1951

## 2021-02-22 NOTE — Patient Instructions (Signed)
Standing Marching   Using a chair if necessary, march in place. Repeat 10 times. Do 1 sessions per day.     Hip Backward Kick   Using a chair for balance, keep legs shoulder width apart and toes pointed for- ward. Slowly extend one leg back, keeping knee straight. Do not lean forward. Repeat with other leg. Repeat 10 times. Do 1 sessions per day.     Hip Side Kick   Holding a chair for balance, keep legs shoulder width apart and toes pointed forward. Swing a leg out to side, keeping knee straight. Do not lean. Repeat using other leg. Repeat 10 times. Do 1 sessions per day.    ALSO DO FORWARD KICKS - 10 REPS EACH LEG ALTERNATING - HOLD ONTO COUNTER AS NEEDED   Standing On One Leg Without Support .  Stand on one leg in neutral spine without support. Hold 10 seconds. Repeat on other leg. Do 2 repetitions, 1 sets.  http://bt.exer.us/36     Side-Stepping   Walk to left side with eyes open. Take even steps, leading with same foot. Make sure each foot lifts off the floor. Repeat in opposite direction. Repeat for 2 times along counter. Do 1 sessions per day.

## 2021-02-27 ENCOUNTER — Other Ambulatory Visit: Payer: Self-pay

## 2021-02-27 ENCOUNTER — Encounter: Payer: Self-pay | Admitting: Physical Therapy

## 2021-02-27 ENCOUNTER — Ambulatory Visit: Payer: Self-pay | Admitting: Physical Therapy

## 2021-02-27 DIAGNOSIS — R2689 Other abnormalities of gait and mobility: Secondary | ICD-10-CM

## 2021-02-27 DIAGNOSIS — R293 Abnormal posture: Secondary | ICD-10-CM

## 2021-02-27 DIAGNOSIS — R2681 Unsteadiness on feet: Secondary | ICD-10-CM

## 2021-02-27 DIAGNOSIS — I69351 Hemiplegia and hemiparesis following cerebral infarction affecting right dominant side: Secondary | ICD-10-CM

## 2021-02-27 NOTE — Therapy (Signed)
[facility] [address] [place], [place], [address] Phone: [phone]   Fax:  [phone]  Physical Therapy Treatment  Patient Details  Name: Jeff [name] MRN: [id number] Date of Birth: [birth year] Referring Provider (PT): Marlin Canary, DO   Encounter Date: 02/27/2021   PT End of Session - 02/27/21 1834     Visit Number 4    Number of Visits 15   15 visits authorized by [facility]   Date for PT Re-Evaluation 04/07/21    Authorization Type VA - 15 visits    Authorization Time Period 02-14-21 - 04-16-21    Authorization - Visit Number 4    Authorization - Number of Visits 15    PT Start Time 1100    PT Stop Time 1143    PT Time Calculation (min) 43 min    Equipment Utilized During Treatment Other (comment)   pool noodle   Activity Tolerance Patient tolerated treatment well    Behavior During Therapy [facility] for tasks assessed/performed             Past Medical History:  Diagnosis Date   ADHD (attention deficit hyperactivity disorder)    Diabetes mellitus    Hyperlipidemia    Hypertension    Renal disorder    Stroke [facility])     Past Surgical History:  Procedure Laterality Date   IR ANGIO INTRA EXTRACRAN SEL COM CAROTID INNOMINATE BILAT MOD SED  01/03/2021   IR ANGIO VERTEBRAL SEL SUBCLAVIAN INNOMINATE UNI L MOD SED  01/03/2021   IR ANGIO VERTEBRAL SEL VERTEBRAL UNI R MOD SED  01/03/2021   IR RADIOLOGIST EVAL & MGMT  01/13/2021   IR US GUIDE VASC ACCESS RIGHT  01/03/2021   NECK SURGERY     SPINE SURGERY     Cervical spine x 2; Vear Clock; Critzer.    There were no vitals filed for this visit.   Subjective Assessment - 02/27/21 1824     Subjective No new complaints. No falls or pain. Blood sugar prior to starting pool session 201.    Patient is accompained by: Family member   spouse [name]   Pertinent History h/o prior CVA (08-08-18):  h/o cervical fusion C5-7 (2012): h/o neck injury 2016: DM type II, HTN,  stage 3 CKD     Limitations Walking;House hold activities;Lifting    Diagnostic tests MRI and MRA on 01-01-21    Patient Stated Goals Improve balance; walk without falling    Currently in Pain? No/denies    Pain Score 0-No pain            Aquatic therapy at [facility] - pool temp 94 degrees   Patient seen for aquatic therapy today.  Treatment took place in water 3.5-4.5 feet deep depending upon activity.  Pt entered and  Exited the pool via step negotiation with use of bil. Hands rails, step by step both ways  Working on gait across pool in ~4 foot depth of water. Forward/backward with no AD for 4 laps, then side stepping for 4 laps each way. Min guard assist. Then had pt work on more dynamic gait patterns across the pool: cross overs, tandem forward, toe walking, heel walking, all for 4 laps across pool with no AD for balance, min guard to min assist.  For strengthening: seated on bench in pool sit to stands with feet in parallel position for 10 reps, then with feet in stride position for 10 reps each foot forward. At edge of pool: hip  abduction, extension and flexion for 10 reps each with bil LE's.   Working on balance: holding a blue foam noodle with bil hands out in front with UE extension- moving upper trunk in rotation left<>right with noodle at water level with min guard to min assist. Then holding the noodle out in front- shoulder extension bring noodle down to legs, then back up to water level with assist for balance needed. With no AD: in stance with feet hip width apart worked on various arm movements to challenge balance in ~4 foot water depth. Then with single UE support on wall worked on single leg stance for forward>lateral>back kicks for 1 set of 5 reps each side.     Pt requires buoyancy of water for support for reduced fall risk with gait training and balance exercises with minimal UE support; exercises able to be performed safely in water without the risk of fall compared to those same  exercises performed on land;  viscosity of water needed for resistance for strengthening.  Current of water provides perturbations for challenging static & dynamic standing balance.            PT Short Term Goals - 02/22/21 0723       PT SHORT TERM GOAL #1   Title Improve Berg score from 33/56 to >/= 37/56 for reduced fall risk.    Baseline 33/56 on 02-14-21    Time 4    Period Weeks    Status New    Target Date 03/17/21      PT SHORT TERM GOAL #2   Title Improve TUG score from 28.12 secs to </= 23 secs with no device for decr. fall risk.    Baseline 28.12 secs without device    Time 4    Period Weeks    Status New    Target Date 03/17/21      PT SHORT TERM GOAL #3   Title Incr. gait velocity from 1.18 ft/sec to >/= 1.5 ft/sec for incr. gait efficiency (without device).    Baseline 27.81 secs = 1.18 ft/sec without device    Time 4    Period Weeks    Status New    Target Date 03/17/21      PT SHORT TERM GOAL #4   Title Pt will perform sit to stand from mat table with 1 UE support to demo improved LE strength.    Time 4    Period Weeks    Status New    Target Date 03/17/21      PT SHORT TERM GOAL #5   Title Independent in HEP for balance & strengthening exs.    Time 4    Period Weeks    Status New    Target Date 03/17/21               PT Long Term Goals - 02/22/21 0723       PT LONG TERM GOAL #1   Title Increase Berg score to >/= 41/56 for reduced fall risk.    Baseline 33/56 on 02-13-21    Time 8    Period Weeks    Status New      PT LONG TERM GOAL #2   Title Improve TUG score from 28.12 secs to </= 20 secs without device to reduce fall risk.    Baseline 28.12 secs without device    Time 8    Period Weeks    Status New      PT LONG TERM  GOAL #3   Title Pt will amb. 500' with SPC on even and uneven surfaces with supervision for incr. community accessibility.    Time 8    Period Weeks    Status New      PT LONG TERM GOAL #4   Title Perform  sit to stand from mat table without UE support to demo improved LE strength.    Time 8    Period Weeks    Status New      PT LONG TERM GOAL #5   Title Independent in updated HEP for balance and strengthening.    Time 8    Period Weeks    Status New      PT LONG TERM GOAL #6   Title Increase Foto score from 53/100 to >/= 60/100 to demo improvement in functional status.    Baseline 53/100 on 02-13-21    Time 8    Period Weeks    Status New                   Plan - 02/27/21 1837     Clinical Impression Statement Today's skilled session was continued to addresss strengthening, balance and gait in the aquatic setting. Pt's blood sugar dropped from 201 to 198 with sesssion with no symptoms or issues noted or reported. The pt continues to fatigue with aquatic ex's, needing short rest breaks through out sesssion. No other issues noted or reported in session. The pt is making steady progress toward goals and should benefit from continued PT to progress toward unmet goals.    Personal Factors and Comorbidities Age;Fitness;Comorbidity 2;Transportation;Past/Current Experience    Comorbidities h/o cervical fusion C5-7 (2012), h/o neck injury 2016, h/o prior CVA in March 2020, DM type II, HTN, stage 3 CKD    Examination-Activity Limitations Locomotion Level;Transfers;Bend;Stairs;Stand;Lift;Squat    Examination-Participation Restrictions Church;Cleaning;Meal Prep;Driving;Community Activity;Interpersonal Relationship;Laundry;Shop;Yard Work    Conservation officer, historic buildings Evolving/Moderate complexity    Rehab Potential Good    PT Frequency 2x / week    PT Duration 8 weeks    PT Treatment/Interventions ADLs/Self Care Home Management;Aquatic Therapy;Gait training;Stair training;Therapeutic activities;Therapeutic exercise;Balance training;DME Instruction;Neuromuscular re-education;Patient/family education    PT Next Visit Plan Aquatic exercise - balance and gait - pt fatigues quickly ; cont  balance and gait training - without device in clinic    PT Home Exercise Plan balance and sit to stand    Consulted and Agree with Plan of Care Patient;Family member/caregiver    Family Member Consulted [name]             Patient will benefit from skilled therapeutic intervention in order to improve the following deficits and impairments:  Decreased balance, Decreased endurance, Decreased strength, Difficulty walking, Pain, Postural dysfunction  Visit Diagnosis: Unsteadiness on feet  Other abnormalities of gait and mobility  Abnormal posture  Hemiplegia and hemiparesis following cerebral infarction affecting right dominant side [facility])     Problem List Patient Active Problem List   Diagnosis Date Noted   TIA (transient ischemic attack) 03/10/2020   CKD (chronic kidney disease) 08/28/2018   Retinopathy of both eyes 08/28/2018   Stroke due to embolism of left vertebral artery (HCC) 08/12/2018   Stroke (HCC) 08/08/2018   Diabetes mellitus (HCC) 07/25/2015   Neck injury 06/22/2015   Hearing loss in right ear 10/14/2011   Agent orange exposure 10/14/2011   ADD (attention deficit disorder) 10/14/2011   Hyperlipidemia 10/14/2011   BMI 28.0-28.9,adult 10/14/2011    Sallyanne Kuster, PTA 02/27/2021, 7:52  PM  [facility] [address] [place], [place], [address] Phone: [phone]   Fax:  [phone]  Name: LAMARI BECKLES MRN: [id number] Date of Birth: 1950-07-10

## 2021-02-28 ENCOUNTER — Ambulatory Visit: Payer: No Typology Code available for payment source | Admitting: Physical Therapy

## 2021-03-02 ENCOUNTER — Other Ambulatory Visit: Payer: Self-pay

## 2021-03-02 ENCOUNTER — Ambulatory Visit: Payer: No Typology Code available for payment source | Admitting: Physical Therapy

## 2021-03-02 VITALS — BP 130/78

## 2021-03-02 DIAGNOSIS — R2681 Unsteadiness on feet: Secondary | ICD-10-CM

## 2021-03-02 DIAGNOSIS — R2689 Other abnormalities of gait and mobility: Secondary | ICD-10-CM | POA: Diagnosis not present

## 2021-03-02 NOTE — Therapy (Signed)
[facility] [address] [place], [place], [address] Phone: [phone]   Fax:  [phone]  Physical Therapy Treatment  Patient Details  Name: Jeff [name] MRN: [id number] Date of Birth: [birth year] Referring Provider (PT): Marlin Canary, DO   Encounter Date: 03/02/2021   PT End of Session - 03/02/21 0846     Visit Number 6    Number of Visits 15   15 visits authorized by [facility]   Date for PT Re-Evaluation 04/07/21    Authorization Type [facility] - 15 visits    Authorization Time Period 02-14-21 - 04-16-21    Authorization - Visit Number 5    Authorization - Number of Visits 15    PT Start Time 0845    PT Stop Time 0930    PT Time Calculation (min) 45 min    Equipment Utilized During Treatment Other (comment)   pool noodle, large barbell   Activity Tolerance Patient tolerated treatment well    Behavior During Therapy WFL for tasks assessed/performed             Past Medical History:  Diagnosis Date   ADHD (attention deficit hyperactivity disorder)    Diabetes mellitus    Hyperlipidemia    Hypertension    Renal disorder    Stroke [facility])     Past Surgical History:  Procedure Laterality Date   IR ANGIO INTRA EXTRACRAN SEL COM CAROTID INNOMINATE BILAT MOD SED  01/03/2021   IR ANGIO VERTEBRAL SEL SUBCLAVIAN INNOMINATE UNI L MOD SED  01/03/2021   IR ANGIO VERTEBRAL SEL VERTEBRAL UNI R MOD SED  01/03/2021   IR RADIOLOGIST EVAL & MGMT  01/13/2021   IR US GUIDE VASC ACCESS RIGHT  01/03/2021   NECK SURGERY     SPINE SURGERY     Cervical spine x 2; Vear Clock; Critzer.    Vitals:   03/02/21 0920  BP: 130/78     Subjective Assessment - 03/02/21 1927     Subjective Pt reports he had a really good pool session on Monday - states his legs were sore; pt is requesting exercises for strengthening his core    Patient is accompained by: Family member   spouse [name]   Pertinent History h/o prior CVA (08-08-18):  h/o cervical fusion C5-7  (2012): h/o neck injury 2016: DM type II, HTN,  stage 3 CKD    Limitations Walking;House hold activities;Lifting    Diagnostic tests MRI and MRA on 01-01-21    Patient Stated Goals Improve balance; walk without falling    Currently in Pain? No/denies                               [facility] Adult PT Treatment/Exercise - 03/02/21 0001       Transfers   Transfers Sit to Stand;Stand to Sit    Sit to Stand 5: Supervision;4: Min guard    Stand to Sit 5: Supervision    Number of Reps 10 reps    Comments no UE support   5 reps feet on floor; 5 reps feet on Airex     Ambulation/Gait   Ambulation/Gait Yes    Ambulation/Gait Assistance 5: Supervision    Ambulation/Gait Assistance Details cues for increased arm swing and upright posture    Ambulation Distance (Feet) 115 Feet    Assistive device None    Gait Pattern Step-through pattern    Ambulation Surface Level;Indoor  Neuro Re-ed    Neuro Re-ed Details  Tall kneeling - UE support on blue ball; lifting one UE, thne lifitng both arms up to 90 degrees; tall kneeling with head turns without UE support - 5 reps side to side; moving each leg forward /back 3 reps with UE support on blue ball      Exercises   Exercises Knee/Hip      Knee/Hip Exercises: Standing   Heel Raises Both;Right;Left;3 sets;10 reps   3 sets bil LE's, 10 reps RLE and 10 reps LLE   Other Standing Knee Exercises alternate tap ups to 1st step 10 reps each leg with CGA with minimal UE support      Knee/Hip Exercises: Supine   Bridges AROM;Both;1 set;5 reps    Bridges with Clamshell AROM;Both;1 set;5 reps    Straight Leg Raises AROM;Both;1 set;10 reps   pelvic tilt before lifting   Other Supine Knee/Hip Exercises abdominal crunches 20 reps: elbows behind head; arms straight by each side    Other Supine Knee/Hip Exercises Bridging with marching 5 reps each; bridging with LE extension 5 reps each leg; pelvic tilt - hip flexion in hooklying 10 reps each leg                      PT Education - 03/02/21 1946     Education Details [employer] HEP for core strengthening    Person(s) Educated Patient    Methods Explanation;Demonstration;Handout    Comprehension Verbalized understanding;Returned demonstration              PT Short Term Goals - 03/02/21 1948       PT SHORT TERM GOAL #1   Title Improve Berg score from 33/56 to >/= 37/56 for reduced fall risk.    Baseline 33/56 on 02-14-21    Time 4    Period Weeks    Status New    Target Date 03/17/21      PT SHORT TERM GOAL #2   Title Improve TUG score from 28.12 secs to </= 23 secs with no device for decr. fall risk.    Baseline 28.12 secs without device    Time 4    Period Weeks    Status New    Target Date 03/17/21      PT SHORT TERM GOAL #3   Title Incr. gait velocity from 1.18 ft/sec to >/= 1.5 ft/sec for incr. gait efficiency (without device).    Baseline 27.81 secs = 1.18 ft/sec without device    Time 4    Period Weeks    Status New    Target Date 03/17/21      PT SHORT TERM GOAL #4   Title Pt will perform sit to stand from mat table with 1 UE support to demo improved LE strength.    Time 4    Period Weeks    Status New    Target Date 03/17/21      PT SHORT TERM GOAL #5   Title Independent in HEP for balance & strengthening exs.    Time 4    Period Weeks    Status New    Target Date 03/17/21               PT Long Term Goals - 03/02/21 1948       PT LONG TERM GOAL #1   Title Increase Berg score to >/= 41/56 for reduced fall risk.    Baseline 33/56 on 02-13-21  Time 8    Period Weeks    Status New      PT LONG TERM GOAL #2   Title Improve TUG score from 28.12 secs to </= 20 secs without device to reduce fall risk.    Baseline 28.12 secs without device    Time 8    Period Weeks    Status New      PT LONG TERM GOAL #3   Title Pt will amb. 500' with SPC on even and uneven surfaces with supervision for incr. community accessibility.     Time 8    Period Weeks    Status New      PT LONG TERM GOAL #4   Title Perform sit to stand from mat table without UE support to demo improved LE strength.    Time 8    Period Weeks    Status New      PT LONG TERM GOAL #5   Title Independent in updated HEP for balance and strengthening.    Time 8    Period Weeks    Status New      PT LONG TERM GOAL #6   Title Increase Foto score from 53/100 to >/= 60/100 to demo improvement in functional status.    Baseline 53/100 on 02-13-21    Time 8    Period Weeks    Status New                   Plan - 03/02/21 1949     Clinical Impression Statement Skilled session focused on HEP instruction for core strengthening and on balance and gait training.  Pt able to perform pelvic tilt with tactile and verbal cues for correct movement.  Pt's posture during gait training at end of session much improved compared to that at start of session with pt standing more erect with less Lt lateral trunk lean.  Cont with POC.    Personal Factors and Comorbidities Age;Fitness;Comorbidity 2;Transportation;Past/Current Experience    Comorbidities h/o cervical fusion C5-7 (2012), h/o neck injury 2016, h/o prior CVA in March 2020, DM type II, HTN, stage 3 CKD    Examination-Activity Limitations Locomotion Level;Transfers;Bend;Stairs;Stand;Lift;Squat    Examination-Participation Restrictions Church;Cleaning;Meal Prep;Driving;Community Activity;Interpersonal Relationship;Laundry;Shop;Yard Work    Conservation officer, historic buildings Evolving/Moderate complexity    Rehab Potential Good    PT Frequency 2x / week    PT Duration 8 weeks    PT Treatment/Interventions ADLs/Self Care Home Management;Aquatic Therapy;Gait training;Stair training;Therapeutic activities;Therapeutic exercise;Balance training;DME Instruction;Neuromuscular re-education;Patient/family education    PT Next Visit Plan Aquatic exercise - balance and gait training;  cont balance and gait training -  without device in clinic    PT Home Exercise Plan balance and sit to stand    Consulted and Agree with Plan of Care Patient;Family member/caregiver    Family Member Consulted [name]             Patient will benefit from skilled therapeutic intervention in order to improve the following deficits and impairments:  Decreased balance, Decreased endurance, Decreased strength, Difficulty walking, Pain, Postural dysfunction  Visit Diagnosis: Unsteadiness on feet  Other abnormalities of gait and mobility     Problem List Patient Active Problem List   Diagnosis Date Noted   TIA (transient ischemic attack) 03/10/2020   CKD (chronic kidney disease) 08/28/2018   Retinopathy of both eyes 08/28/2018   Stroke due to embolism of left vertebral artery (HCC) 08/12/2018   Stroke (HCC) 08/08/2018   Diabetes mellitus (HCC) 07/25/2015  Neck injury 06/22/2015   Hearing loss in right ear 10/14/2011   Agent orange exposure 10/14/2011   ADD (attention deficit disorder) 10/14/2011   Hyperlipidemia 10/14/2011   BMI 28.0-28.9,adult 10/14/2011    Mercedies Ganesh, Donavan Burnet, PT 03/02/2021, 7:55 PM  [facility] [address] [place], [place], [address] Phone: [phone]   Fax:  [phone]  Name: Jeff [name] MRN: [id number] Date of Birth: [birth year]

## 2021-03-06 ENCOUNTER — Ambulatory Visit: Payer: Self-pay | Admitting: Physical Therapy

## 2021-03-07 ENCOUNTER — Ambulatory Visit: Payer: No Typology Code available for payment source | Attending: Internal Medicine | Admitting: Physical Therapy

## 2021-03-07 ENCOUNTER — Ambulatory Visit (INDEPENDENT_AMBULATORY_CARE_PROVIDER_SITE_OTHER): Payer: Medicare PPO | Admitting: Neurology

## 2021-03-07 ENCOUNTER — Encounter: Payer: Self-pay | Admitting: Neurology

## 2021-03-07 ENCOUNTER — Ambulatory Visit: Payer: No Typology Code available for payment source | Admitting: Physical Therapy

## 2021-03-07 ENCOUNTER — Other Ambulatory Visit: Payer: Self-pay

## 2021-03-07 VITALS — BP 109/72 | HR 79 | Ht 68.0 in | Wt 205.0 lb

## 2021-03-07 DIAGNOSIS — I6602 Occlusion and stenosis of left middle cerebral artery: Secondary | ICD-10-CM | POA: Diagnosis not present

## 2021-03-07 DIAGNOSIS — R293 Abnormal posture: Secondary | ICD-10-CM | POA: Diagnosis present

## 2021-03-07 DIAGNOSIS — R2689 Other abnormalities of gait and mobility: Secondary | ICD-10-CM | POA: Diagnosis not present

## 2021-03-07 DIAGNOSIS — R2681 Unsteadiness on feet: Secondary | ICD-10-CM | POA: Diagnosis present

## 2021-03-07 DIAGNOSIS — I69351 Hemiplegia and hemiparesis following cerebral infarction affecting right dominant side: Secondary | ICD-10-CM | POA: Insufficient documentation

## 2021-03-07 DIAGNOSIS — G459 Transient cerebral ischemic attack, unspecified: Secondary | ICD-10-CM

## 2021-03-07 DIAGNOSIS — I6521 Occlusion and stenosis of right carotid artery: Secondary | ICD-10-CM | POA: Diagnosis not present

## 2021-03-07 NOTE — Patient Instructions (Signed)
I had a long d/w patient about his recent TIA and intracranial stenosis,, risk for recurrent stroke/TIAs, personally independently reviewed imaging studies and stroke evaluation results and answered questions.Continue aspirin 81 mg daily and Brilinta (ticagrelor) 90 mg bid  for 1 more month and then stop Brilinta and stay on aspirin alone secondary stroke prevention and maintain strict control of hypertension with blood pressure goal below 130/90, diabetes with hemoglobin A1c goal below 6.5% and lipids with LDL cholesterol goal below 70 mg/dL. I also advised the patient to eat a healthy diet with plenty of whole grains, cereals, fruits and vegetables, exercise regularly and maintain ideal body weight.  I advised the patient [facility] for his sleep apnea.  Also follow-up with endocrinologist for tighter control of his diabetes.  Followup in the future with my nurse practitioner Shanda Bumps in 3 months or call earlier if necessary. Stroke Prevention Some medical conditions and behaviors are associated with a higher chance of having a stroke. You can help prevent a stroke by making nutrition, lifestyle, and other changes, including managing any medical conditions you may have. What nutrition changes can be made?  Eat healthy foods. You can do this by: Choosing foods high in fiber, such as fresh fruits and vegetables and whole grains. Eating at least 5 or more servings of fruits and vegetables a day. Try to fill half of your plate at each meal with fruits and vegetables. Choosing lean protein foods, such as lean cuts of meat, poultry without skin, fish, tofu, beans, and nuts. Eating low-fat dairy products. Avoiding foods that are high in salt (sodium). This can help lower blood pressure. Avoiding foods that have saturated fat, trans fat, and cholesterol. This can help prevent high cholesterol. Avoiding processed and  premade foods. Follow your health care provider's specific guidelines for losing weight, controlling high blood pressure (hypertension), lowering high cholesterol, and managing diabetes. These may include: Reducing your daily calorie intake. Limiting your daily sodium intake to 1,500 milligrams (mg). Using only healthy fats for cooking, such as olive oil, canola oil, or sunflower oil. Counting your daily carbohydrate intake. What lifestyle changes can be made? Maintain a healthy weight. Talk to your health care provider about your ideal weight. Get at least 30 minutes of moderate physical activity at least 5 days a week. Moderate activity includes brisk walking, biking, and swimming. Do not use any products that contain nicotine or tobacco, such as cigarettes and e-cigarettes. If you need help quitting, ask your health care provider. It may also be helpful to avoid exposure to secondhand smoke. Limit alcohol intake to no more than 1 drink a day for nonpregnant women and 2 drinks a day for men. One drink equals 12 oz of beer, 5 oz of wine, or 1 oz of hard liquor. Stop any illegal drug use. Avoid taking birth control pills. Talk to your health care provider about the risks of taking birth control pills if: You are over 46 years old. You smoke. You get migraines. You have ever had a blood clot. What other changes can be made? Manage your cholesterol levels. Eating a healthy diet is important for preventing high cholesterol. If cholesterol cannot be managed through diet alone, you may also need to take medicines. Take any prescribed medicines to control your cholesterol as told by your health care provider. Manage your diabetes. Eating a healthy diet and exercising regularly are important parts of managing your blood  sugar. If your blood sugar cannot be managed through diet and exercise, you may need to take medicines. Take any prescribed medicines to control your diabetes as told by your health  care provider. Control your hypertension. To reduce your risk of stroke, try to keep your blood pressure below 130/80. Eating a healthy diet and exercising regularly are an important part of controlling your blood pressure. If your blood pressure cannot be managed through diet and exercise, you may need to take medicines. Take any prescribed medicines to control hypertension as told by your health care provider. Ask your health care provider if you should monitor your blood pressure at home. Have your blood pressure checked every year, even if your blood pressure is normal. Blood pressure increases with age and some medical conditions. Get evaluated for sleep disorders (sleep apnea). Talk to your health care provider about getting a sleep evaluation if you snore a lot or have excessive sleepiness. Take over-the-counter and prescription medicines only as told by your health care provider. Aspirin or blood thinners (antiplatelets or anticoagulants) may be recommended to reduce your risk of forming blood clots that can lead to stroke. Make sure that any other medical conditions you have, such as atrial fibrillation or atherosclerosis, are managed. What are the warning signs of a stroke? The warning signs of a stroke can be easily remembered as BEFAST. B is for balance. Signs include: Dizziness. Loss of balance or coordination. Sudden trouble walking. E is for eyes. Signs include: A sudden change in vision. Trouble seeing. F is for face. Signs include: Sudden weakness or numbness of the face. The face or eyelid drooping to one side. A is for arms. Signs include: Sudden weakness or numbness of the arm, usually on one side of the body. S is for speech. Signs include: Trouble speaking (aphasia). Trouble understanding. T is for time. These symptoms may represent a serious problem that is an emergency. Do not wait to see if the symptoms will go away. Get medical help right away. Call your local  emergency services (911 in the [place] Do not drive yourself to the hospital. Other signs of stroke may include: A sudden, severe headache with no known cause. Nausea or vomiting. Seizure. Where to find more information For more information, visit: American Stroke Association: www.strokeassociation.org National Stroke Association: www.stroke.org Summary You can prevent a stroke by eating healthy, exercising, not smoking, limiting alcohol intake, and managing any medical conditions you may have. Do not use any products that contain nicotine or tobacco, such as cigarettes and e-cigarettes. If you need help quitting, ask your health care provider. It may also be helpful to avoid exposure to secondhand smoke. Remember BEFAST for warning signs of stroke. Get help right away if you or a loved one has any of these signs. This information is not intended to replace advice given to you by your health care provider. Make sure you discuss any questions you have with your health care provider. Document Revised: 05/03/2017 Document Reviewed: 06/26/2016 Elsevier Patient Education  2021 [employer].

## 2021-03-07 NOTE — Progress Notes (Signed)
[facility] [address] [place]. [place] [address] [phone]       OFFICE FOLLOW-UP NOTE  [name] Date of Birth:  02-Feb-1951 Medical Record Number:  [id number]   HPI: [name] is a pleasant 70 year old Caucasian male seen today for initial office follow-up visit following hospital consultation for TIA.  History is obtained from the patient and and wife and have personally reviewed electronic medical records as well as pertinent available imaging films in PACS. [name] has past medical history of diabetes, pretension, hyperlipidemia, obstructive sleep apnea and prior stroke with some residual right-sided weakness and gait difficulty who presented on 01/01/2021 with sudden onset of word finding difficulties, slurred speech and fall due to bilateral right greater than left leg weakness.  He was fine when he went to bed the night prior but when he woke up he could not remember the exact time but noticed that his legs were weak and he had a fall while trying to get up and when he tried to speak his speech was not right.  His speech was slurred and he had trouble getting the words out.  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  He had a prior history of a stroke in March 2020 which was a punctate left medullary infarct for which she was treated with tPA with modest improvement but with some residual right-sided deficits and gait imbalance.  MRI scan of the brain showed no acute abnormality and showed chronic lacunar infarcts in the right basal ganglia carotid ultrasound showed only 1-39% bilateral carotid stenosis.  2D echo showed ejection fraction of 60 to 65% without definite clot.  LDL cholesterol 41 mg  percent and hemoglobin A1c was elevated 8.6.  Patient also had diagnosis of a catheter angiogram on 01/03/2021 which showed only moderate 50% bilateral cavernous carotid stenosis and 50 to 60% proximal right PCA stenosis.  Left vertebral artery was occluded in the distal segment.  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  There was felt to be high-grade stenosis of the superior division of the left middle cerebral artery but this was difficult to appreciate on the corresponding CT angio and MR angiogram images and states it was felt that patient should be treated with maximal medical therapy and was discharged on aspirin and Brilinta for 3 months..  He states he is doing well is tolerating both medications without significant bruising or bleeding.  Blood pressures under good control today it is 109/72.  Is tolerating Crestor well without muscle aches and pains.  His sugars however fluctuate quite a bit.  He has an appointment to see endocrinologist to discuss better diabetes control.  Patient does have history of sleep apnea and snores a lot.  However he has tried 4 different CPAP mask and has not been able to tolerate them.  He sees a neurologist in the [place] system and plans to discuss alternative treatment options at next visit.  He is currently doing outpatient physical occupational therapy.  He still has mild paresthesias in his feet from diabetic neuropathy.  He is also getting some outpatient occupational therapy for right frozen shoulder at the [place].  He has no new complaints.  ROS:  14 system review of systems is positive for, speech difficulty, aphasia, weakness, numbness, snoring and all other systems negative  PMH:  Past Medical History:  Diagnosis Date   ADHD (attention deficit hyperactivity disorder)    Diabetes mellitus    Hyperlipidemia    Hypertension    Renal disorder    Stroke [facility])     Social History:  Social History   Socioeconomic History   Marital status:  Married    Spouse name: Not on file   Number of children: Not on file   Years of education: Not on file   Highest education level: Not on file  Occupational History   Occupation: Archivist  Tobacco Use   Smoking status: Never   Smokeless tobacco: Never  Vaping Use   Vaping Use: Never used  Substance and Sexual Activity   Alcohol use: No    Alcohol/week: 0.0 standard drinks   Drug use: No   Sexual activity: Yes  Other Topics Concern   Not on file  Social History Narrative   Not on file   Social Determinants of Health   Financial Resource Strain: Not on file  Food Insecurity: Not on file  Transportation Needs: Not on file  Physical Activity: Not on file  Stress: Not on file  Social Connections: Not on file  Intimate Partner Violence: Not on file    Medications:   Current Outpatient Medications on File Prior to Visit  Medication Sig Dispense Refill   acetaminophen (TYLENOL) 500 MG tablet Take 500-1,000 mg by mouth every 6 (six) hours as needed for headache.     aspirin EC 81 MG EC tablet Take 1 tablet (81 mg total) by mouth daily. Swallow whole. 30 tablet 11   Cholecalciferol (VITAMIN D) 50 MCG (2000 UT) tablet Take 2,000 Units by mouth daily.     empagliflozin (JARDIANCE) 25 MG TABS tablet TAKE ONE-HALF TABLET BY MOUTH EVERY MORNING FOR DIABETES; THIS REPLACES METFORMIN XR.     Glucagon 1 MG/0.2ML SOAJ Inject 1 mg into the skin as needed (low blood sugar).     glucose blood (ONETOUCH VERIO) test strip 1 each by Other route 4 (four) times daily -  before meals and at bedtime. dexcom 6     insulin aspart (NOVOLOG FLEXPEN) 100 UNIT/ML FlexPen Inject 23 Units into the skin 3 (three) times daily as needed for high blood sugar. Inject 23 units TID PRN If BS >120 BUT HOLD if BS<120     insulin glargine (LANTUS) 100 UNIT/ML injection Inject 0.45-0.55 mLs (45-55 Units total) into the skin 2 (two) times daily. Inject 45 units in the morning and 55 units at bedtime      Insulin Syringe-Needle U-100 (B-D INS SYR ULTRAFINE 1CC/31G) 31G X 5/16" 1 ML MISC Used to inject insulin twice daily. 90 each 2   levothyroxine (SYNTHROID) 50 MCG tablet Take 50 mcg by mouth daily before breakfast.     lisinopril (ZESTRIL) 2.5 MG tablet Take 1 tablet (2.5 mg total) by mouth daily.     Magnesium Oxide 420 MG TABS Take 1 tablet by mouth in the morning and at bedtime.     Multiple Vitamins-Minerals (ICAPS AREDS 2 PO) Take 1 capsule by mouth in the morning and at bedtime.     pantoprazole (PROTONIX) 40 MG tablet Take 1 tablet (40 mg total) by mouth daily.     rosuvastatin (CRESTOR) 40 MG tablet TAKE 1 TABLET BY MOUTH DAILY 90 tablet 0   ticagrelor (BRILINTA) 90  MG TABS tablet Take 1 tablet (90 mg total) by mouth 2 (two) times daily. 60 tablet 1   No current facility-administered medications on file prior to visit.    Allergies:   Allergies  Allergen Reactions   Flexeril [Cyclobenzaprine Hcl] Other (See Comments)    Causes him to pass out and lowers his BP   Lidocaine Swelling    throat   Novocain [Procaine Hcl] Swelling    Physical Exam General: well developed, well nourished elderly Caucasian male, seated, in no evident distress Head: head normocephalic and atraumatic.  Neck: supple with no carotid or supraclavicular bruits Cardiovascular: regular rate and rhythm, no murmurs Musculoskeletal: no deformity Skin:  no rash/petichiae Vascular:  Normal pulses all extremities Vitals:   03/07/21 0938  BP: 109/72  Pulse: 79   Neurologic Exam Mental Status: Awake and fully alert. Oriented to place and time. Recent and remote memory intact. Attention span, concentration and fund of knowledge appropriate. Mood and affect appropriate.  Cranial Nerves: Fundoscopic exam reveals sharp disc margins. Pupils equal, briskly reactive to light. Extraocular movements full without nystagmus. Visual fields full to confrontation. Hearing intact. Facial sensation intact. Face, tongue,  palate moves normally and symmetrically.  Motor: Normal bulk and tone. Normal strength in all tested extremity muscles. Sensory.: intact to touch ,pinprick but slightly impaired.position and vibratory sensation over both feet bilaterally..  Romberg is mildly positive Coordination: Rapid alternating movements normal in all extremities. Finger-to-nose and heel-to-shin performed accurately bilaterally. Gait and Station: Arises from chair without difficulty. Stance is normal. Gait demonstrates normal stride length and mild imbalance .  Unsteady while standing on either foot unsupported not able to heel, toe and tandem walk without difficulty.  Reflexes: 1+ and symmetric. Toes downgoing.   NIHSS  0 Modified Rankin  1   ASSESSMENT: 70 year old Caucasian male with transient episode of expressive aphasia and right hemiparesis likely left hemispheric TIA with vascular risk factors of diabetes, hypertension, hyperlipidemia , prior stroke and intracranial stenosis and sleep apnea     PLAN: I had a long d/w patient about his recent TIA and intracranial stenosis,, risk for recurrent stroke/TIAs, personally independently reviewed imaging studies and stroke evaluation results and answered questions.Continue aspirin 81 mg daily and Brilinta (ticagrelor) 90 mg bid  for 1 more month and then stop Brilinta and stay on aspirin alone secondary stroke prevention and maintain strict control of hypertension with blood pressure goal below 130/90, diabetes with hemoglobin A1c goal below 6.5% and lipids with LDL cholesterol goal below 70 mg/dL. I also advised the patient to eat a healthy diet with plenty of whole grains, cereals, fruits and vegetables, exercise regularly and maintain ideal body weight.  I advised the patient [facility] for his sleep apnea.  Also follow-up with endocrinologist for tighter control of his diabetes.   Followup in the future with my nurse practitioner Shanda Bumps in 3 months or call earlier if necessary. Greater than 50% of time during this 35 minute visit was spent on counseling,explanation of diagnosis of TIA and intracranial stenosis, planning of further management, discussion with patient and family and coordination of care Delia Heady MD Note: This document was prepared with digital dictation and possible smart phrase technology. Any transcriptional errors that result from this process are unintentional

## 2021-03-08 ENCOUNTER — Encounter: Payer: Self-pay | Admitting: Physical Therapy

## 2021-03-08 NOTE — Therapy (Signed)
[facility] [address] [place], [place], [address] Phone: [phone]   Fax:  [phone]  Physical Therapy Treatment  Patient Details  Name: Jeff [name] MRN: [id number] Date of Birth: 05-12-1951 Referring Provider (PT): Marlin Canary, DO   Encounter Date: 03/07/2021   PT End of Session - 03/08/21 0949     Visit Number 6    Number of Visits 15   15 visits authorized by [facility]   Date for PT Re-Evaluation 04/07/21    Authorization Type [facility] - 15 visits    Authorization Time Period 02-14-21 - 04-16-21    Authorization - Visit Number 6    Authorization - Number of Visits 15    PT Start Time 0850    PT Stop Time 0923   pt requested to leave early due to another appt at 9:30   PT Time Calculation (min) 33 min    Equipment Utilized During Treatment Other (comment)   pool noodle, large barbell   Activity Tolerance Patient tolerated treatment well    Behavior During Therapy WFL for tasks assessed/performed             Past Medical History:  Diagnosis Date   ADHD (attention deficit hyperactivity disorder)    Diabetes mellitus    Hyperlipidemia    Hypertension    Renal disorder    Stroke [facility])     Past Surgical History:  Procedure Laterality Date   IR ANGIO INTRA EXTRACRAN SEL COM CAROTID INNOMINATE BILAT MOD SED  01/03/2021   IR ANGIO VERTEBRAL SEL SUBCLAVIAN INNOMINATE UNI L MOD SED  01/03/2021   IR ANGIO VERTEBRAL SEL VERTEBRAL UNI R MOD SED  01/03/2021   IR RADIOLOGIST EVAL & MGMT  01/13/2021   IR US GUIDE VASC ACCESS RIGHT  01/03/2021   NECK SURGERY     SPINE SURGERY     Cervical spine x 2; Vear Clock; Critzer.    There were no vitals filed for this visit.   Subjective Assessment - 03/08/21 0935     Subjective Pt reports he had a minor fall Sunday night - says he was putting on pants and his Rt heel got caught on bottom of pant leg, caused him to lose his balance and fall- states he hit the top of his head on the  vanity in bathroom but did not get hurt;  sees Dr. Pearlean Brownie today after PT appt.    Patient is accompained by: Family member   spouse [name]   Pertinent History h/o prior CVA (08-08-18):  h/o cervical fusion C5-7 (2012): h/o neck injury 2016: DM type II, HTN,  stage 3 CKD    Limitations Walking;House hold activities;Lifting    Diagnostic tests MRI and MRA on 01-01-21    Patient Stated Goals Improve balance; walk without falling    Currently in Pain? No/denies                               [facility] Adult PT Treatment/Exercise - 03/08/21 0001       Transfers   Transfers Sit to Stand;Stand to Sit    Sit to Stand 5: Supervision    Stand to Sit 5: Supervision    Number of Reps Other reps (comment)   5 reps   Comments no UE support - feet on floor      Ambulation/Gait   Ambulation/Gait Yes    Ambulation/Gait Assistance 5: Supervision  Ambulation/Gait Assistance Details less Lt lateral trunk lean noted    Ambulation Distance (Feet) 115 Feet    Assistive device None    Gait Pattern Step-through pattern    Ambulation Surface Level;Indoor      Knee/Hip Exercises: Stretches   Active Hamstring Stretch 1 rep;30 seconds;Right      Knee/Hip Exercises: Standing   Heel Raises Both;1 set;10 reps                 Balance Exercises - 03/08/21 0001       Balance Exercises: Standing   Stepping Strategy Anterior;Posterior;Lateral;5 reps   5 reps each leg - performed inside // bars with UE support prn   Partial Tandem Stance Eyes open;2 reps   each position 1 rep - 15 sec hold - EO no head turns   Sidestepping 2 reps   no UE support   Marching Solid surface;Static;Dynamic   10' x 2 reps forward/back inside // bars   Other Standing Exercises Pt performed amb. tossing/catching medium sized purple ball 115' around track for improved balance with multi-tasking with gait    Other Standing Exercises Comments tap ups to 2nd step 10 reps each foot with CGA with UE support prn with LOB                   PT Short Term Goals - 03/08/21 0954       PT SHORT TERM GOAL #1   Title Improve Berg score from 33/56 to >/= 37/56 for reduced fall risk.    Baseline 33/56 on 02-14-21    Time 4    Period Weeks    Status New    Target Date 03/17/21      PT SHORT TERM GOAL #2   Title Improve TUG score from 28.12 secs to </= 23 secs with no device for decr. fall risk.    Baseline 28.12 secs without device    Time 4    Period Weeks    Status New    Target Date 03/17/21      PT SHORT TERM GOAL #3   Title Incr. gait velocity from 1.18 ft/sec to >/= 1.5 ft/sec for incr. gait efficiency (without device).    Baseline 27.81 secs = 1.18 ft/sec without device    Time 4    Period Weeks    Status New    Target Date 03/17/21      PT SHORT TERM GOAL #4   Title Pt will perform sit to stand from mat table with 1 UE support to demo improved LE strength.    Time 4    Period Weeks    Status New    Target Date 03/17/21      PT SHORT TERM GOAL #5   Title Independent in HEP for balance & strengthening exs.    Time 4    Period Weeks    Status New    Target Date 03/17/21               PT Long Term Goals - 03/08/21 0954       PT LONG TERM GOAL #1   Title Increase Berg score to >/= 41/56 for reduced fall risk.    Baseline 33/56 on 02-13-21    Time 8    Period Weeks    Status New      PT LONG TERM GOAL #2   Title Improve TUG score from 28.12 secs to </= 20 secs without device to reduce  fall risk.    Baseline 28.12 secs without device    Time 8    Period Weeks    Status New      PT LONG TERM GOAL #3   Title Pt will amb. 500' with SPC on even and uneven surfaces with supervision for incr. community accessibility.    Time 8    Period Weeks    Status New      PT LONG TERM GOAL #4   Title Perform sit to stand from mat table without UE support to demo improved LE strength.    Time 8    Period Weeks    Status New      PT LONG TERM GOAL #5   Title Independent in  updated HEP for balance and strengthening.    Time 8    Period Weeks    Status New      PT LONG TERM GOAL #6   Title Increase Foto score from 53/100 to >/= 60/100 to demo improvement in functional status.    Baseline 53/100 on 02-13-21    Time 8    Period Weeks    Status New                   Plan - 03/08/21 0951     Clinical Impression Statement Pt's standing posture is much improved at today's session with pt standing more upright with less lateral trunk lean noted; balance is improving with pt able to perform dynamic gait activity (tossing & catching ball) 115' around track with only 2 minor occurrences of LOB.  Pt did state "this is hard" during activity.  Session ended 7" early per pt's request due to appt at 9:30 at [facility] after PT session.  Cont with POC.    Personal Factors and Comorbidities Age;Fitness;Comorbidity 2;Transportation;Past/Current Experience    Comorbidities h/o cervical fusion C5-7 (2012), h/o neck injury 2016, h/o prior CVA in March 2020, DM type II, HTN, stage 3 CKD    Examination-Activity Limitations Locomotion Level;Transfers;Bend;Stairs;Stand;Lift;Squat    Examination-Participation Restrictions Church;Cleaning;Meal Prep;Driving;Community Activity;Interpersonal Relationship;Laundry;Shop;Yard Work    Conservation officer, historic buildings Evolving/Moderate complexity    Rehab Potential Good    PT Frequency 2x / week    PT Duration 8 weeks    PT Treatment/Interventions ADLs/Self Care Home Management;Aquatic Therapy;Gait training;Stair training;Therapeutic activities;Therapeutic exercise;Balance training;DME Instruction;Neuromuscular re-education;Patient/family education    PT Next Visit Plan Aquatic exercise - balance and gait training;  cont balance and gait training - without device in clinic    PT Home Exercise Plan balance and sit to stand    Consulted and Agree with Plan of Care Patient;Family member/caregiver    Family Member Consulted [name]              Patient will benefit from skilled therapeutic intervention in order to improve the following deficits and impairments:  Decreased balance, Decreased endurance, Decreased strength, Difficulty walking, Pain, Postural dysfunction  Visit Diagnosis: Other abnormalities of gait and mobility  Unsteadiness on feet     Problem List Patient Active Problem List   Diagnosis Date Noted   TIA (transient ischemic attack) 03/10/2020   CKD (chronic kidney disease) 08/28/2018   Retinopathy of both eyes 08/28/2018   Stroke due to embolism of left vertebral artery (HCC) 08/12/2018   Stroke (HCC) 08/08/2018   Diabetes mellitus (HCC) 07/25/2015   Neck injury 06/22/2015   Hearing loss in right ear 10/14/2011   Agent orange exposure 10/14/2011   ADD (attention deficit disorder) 10/14/2011  Hyperlipidemia 10/14/2011   BMI 28.0-28.9,adult 10/14/2011    DildayDonavan Burnet, PT 03/08/2021, 9:56 AM  [facility] [address] [place], [place], [address] Phone: [phone]   Fax:  [phone]  Name: RAYSHAUN NEEDLE MRN: [id number] Date of Birth: [birth year]

## 2021-03-13 ENCOUNTER — Encounter: Payer: Self-pay | Admitting: Physical Therapy

## 2021-03-13 ENCOUNTER — Ambulatory Visit: Payer: No Typology Code available for payment source | Admitting: Physical Therapy

## 2021-03-13 ENCOUNTER — Other Ambulatory Visit: Payer: Self-pay

## 2021-03-13 DIAGNOSIS — R2681 Unsteadiness on feet: Secondary | ICD-10-CM

## 2021-03-13 DIAGNOSIS — R2689 Other abnormalities of gait and mobility: Secondary | ICD-10-CM

## 2021-03-13 DIAGNOSIS — R293 Abnormal posture: Secondary | ICD-10-CM

## 2021-03-13 DIAGNOSIS — I69351 Hemiplegia and hemiparesis following cerebral infarction affecting right dominant side: Secondary | ICD-10-CM

## 2021-03-13 NOTE — Therapy (Signed)
[facility] [address] [place], [place], [address] Phone: [phone]   Fax:  [phone]  Physical Therapy Treatment  Patient Details  Name: Jeff [name] MRN: [id number] Date of Birth: [birth year] Referring Provider (PT): Marlin Canary, DO   Encounter Date: 03/13/2021   PT End of Session - 03/13/21 1135     Visit Number 7    Number of Visits 15   15 visits authorized by [facility]   Date for PT Re-Evaluation 04/07/21    Authorization Type VA - 15 visits    Authorization Time Period 02-14-21 - 04-16-21    Authorization - Visit Number 7    Authorization - Number of Visits 15    PT Start Time 1050   started early   PT Stop Time 1130   ended early   PT Time Calculation (min) 40 min    Equipment Utilized During Treatment Other (comment)   pool noodle, small barbell, blue foam wraps/weights   Activity Tolerance Patient tolerated treatment well    Behavior During Therapy WFL for tasks assessed/performed             Past Medical History:  Diagnosis Date   ADHD (attention deficit hyperactivity disorder)    Diabetes mellitus    Hyperlipidemia    Hypertension    Renal disorder    Stroke [facility])     Past Surgical History:  Procedure Laterality Date   IR ANGIO INTRA EXTRACRAN SEL COM CAROTID INNOMINATE BILAT MOD SED  01/03/2021   IR ANGIO VERTEBRAL SEL SUBCLAVIAN INNOMINATE UNI L MOD SED  01/03/2021   IR ANGIO VERTEBRAL SEL VERTEBRAL UNI R MOD SED  01/03/2021   IR RADIOLOGIST EVAL & MGMT  01/13/2021   IR US GUIDE VASC ACCESS RIGHT  01/03/2021   NECK SURGERY     SPINE SURGERY     Cervical spine x 2; Vear Clock; Critzer.    There were no vitals filed for this visit.   Subjective Assessment - 03/13/21 1134     Subjective No new complaitns. No new falls or pain to report. "Took it easy this weekend".    Patient is accompained by: Family member   spouse [name]   Pertinent History h/o prior CVA (08-08-18):  h/o cervical fusion C5-7  (2012): h/o neck injury 2016: DM type II, HTN,  stage 3 CKD    Limitations Walking;House hold activities;Lifting    Diagnostic tests MRI and MRA on 01-01-21    Patient Stated Goals Improve balance; walk without falling    Currently in Pain? No/denies    Pain Score 0-No pain             Aquatic therapy at [facility] - pool temp degrees   Patient seen for aquatic therapy today.  Treatment took place in water 3.5-4.5 feet deep depending upon activity.  Pt entered and  Exited the pool via step negotiation with use of bil. Hands rails, step by step with descension into pool and ascension for exiting the pool.  Gait: performed in ~4 foot water Forward<>backward across pool for 4 laps with no AD Lateral stepping left<>right across pool for 4 laps  Strengthening/stretching: Seated on step: bil hamstring/heel cord stretching for 30 sec's x 3 reps each side passively by PTA. Then with feet parallel to each other sit<>stands x 10 reps, no UE assist, cues for full standing. Then with feet in staggered stance: sit<>stands x 10 reps each foot back, cues on full standing with no UE support.  Standing in ~4 foot water:  With UE support at wall pt performed alternating hip abduction, hip extension and hip flexion x 15 reps each with cues on posture/ex form.  Runner's stretch for 30 sec's x 2 each side  Balance: in ~4 foot water with min guard to min assist at times for balance assistance Toe walking forward across pool with no AD for 4 laps. Heel walking forward across pool with no AD for 2 laps, limited due to reports of ankle discomfort with this.  Standing with feet hip width apart: with single foam bar bells pt performed simultaneous shoulder horizontal abduction/adduction just below the water level,  then adduction to hips/abduction to water level (limited reps of this one due to reports of left shoulder pain), then attempted holding bar bells down by hips for cross country skiing with limited  range due to shoulder pain. Min guard to min assist for balance. Fwd walking while alternating punching on at water surface for 2 laps across pool with min guard assist.  Lateral stepping while moivng arms up/down at sides with foam weights at wrist for 2 laps across pool with min guard assist for balance.  [place] across pool with HHA for 1st lap, then no assistance for return lap with min guard to min assist for balance.    Pt requires buoyancy of water for support for reduced fall risk with gait training and balance exercises with minimal UE support; exercises able to be performed safely in water without the risk of fall compared to those same exercises performed on land;  viscosity of water needed for resistance for strengthening.  Current of water provides perturbations for challenging static & dynamic standing balance.                PT Short Term Goals - 03/08/21 0954       PT SHORT TERM GOAL #1   Title Improve Berg score from 33/56 to >/= 37/56 for reduced fall risk.    Baseline 33/56 on 02-14-21    Time 4    Period Weeks    Status New    Target Date 03/17/21      PT SHORT TERM GOAL #2   Title Improve TUG score from 28.12 secs to </= 23 secs with no device for decr. fall risk.    Baseline 28.12 secs without device    Time 4    Period Weeks    Status New    Target Date 03/17/21      PT SHORT TERM GOAL #3   Title Incr. gait velocity from 1.18 ft/sec to >/= 1.5 ft/sec for incr. gait efficiency (without device).    Baseline 27.81 secs = 1.18 ft/sec without device    Time 4    Period Weeks    Status New    Target Date 03/17/21      PT SHORT TERM GOAL #4   Title Pt will perform sit to stand from mat table with 1 UE support to demo improved LE strength.    Time 4    Period Weeks    Status New    Target Date 03/17/21      PT SHORT TERM GOAL #5   Title Independent in HEP for balance & strengthening exs.    Time 4    Period Weeks    Status New    Target Date  03/17/21               PT Long Term Goals -  03/08/21 0954       PT LONG TERM GOAL #1   Title Increase Berg score to >/= 41/56 for reduced fall risk.    Baseline 33/56 on 02-13-21    Time 8    Period Weeks    Status New      PT LONG TERM GOAL #2   Title Improve TUG score from 28.12 secs to </= 20 secs without device to reduce fall risk.    Baseline 28.12 secs without device    Time 8    Period Weeks    Status New      PT LONG TERM GOAL #3   Title Pt will amb. 500' with SPC on even and uneven surfaces with supervision for incr. community accessibility.    Time 8    Period Weeks    Status New      PT LONG TERM GOAL #4   Title Perform sit to stand from mat table without UE support to demo improved LE strength.    Time 8    Period Weeks    Status New      PT LONG TERM GOAL #5   Title Independent in updated HEP for balance and strengthening.    Time 8    Period Weeks    Status New      PT LONG TERM GOAL #6   Title Increase Foto score from 53/100 to >/= 60/100 to demo improvement in functional status.    Baseline 53/100 on 02-13-21    Time 8    Period Weeks    Status New                   Plan - 03/13/21 1137     Clinical Impression Statement Today's skilled session continued to focus on strengthening, gait and balance in the aquatic setting. Pt needing rest breaks throughout due to fatigue. No other issues noted or reported in session. The pt is making steady progress and should benefit from continued PT to progress toward unmet goals.    Personal Factors and Comorbidities Age;Fitness;Comorbidity 2;Transportation;Past/Current Experience    Comorbidities h/o cervical fusion C5-7 (2012), h/o neck injury 2016, h/o prior CVA in March 2020, DM type II, HTN, stage 3 CKD    Examination-Activity Limitations Locomotion Level;Transfers;Bend;Stairs;Stand;Lift;Squat    Examination-Participation Restrictions Church;Cleaning;Meal Prep;Driving;Community  Activity;Interpersonal Relationship;Laundry;Shop;Yard Work    Conservation officer, historic buildings Evolving/Moderate complexity    Rehab Potential Good    PT Frequency 2x / week    PT Duration 8 weeks    PT Treatment/Interventions ADLs/Self Care Home Management;Aquatic Therapy;Gait training;Stair training;Therapeutic activities;Therapeutic exercise;Balance training;DME Instruction;Neuromuscular re-education;Patient/family education    PT Next Visit Plan Aquatic exercise - balance and gait training;  cont balance and gait training - without device in clinic    PT Home Exercise Plan balance and sit to stand    Consulted and Agree with Plan of Care Patient;Family member/caregiver    Family Member Consulted [name]             Patient will benefit from skilled therapeutic intervention in order to improve the following deficits and impairments:  Decreased balance, Decreased endurance, Decreased strength, Difficulty walking, Pain, Postural dysfunction  Visit Diagnosis: Other abnormalities of gait and mobility  Unsteadiness on feet  Abnormal posture  Hemiplegia and hemiparesis following cerebral infarction affecting right dominant side [facility])     Problem List Patient Active Problem List   Diagnosis Date Noted   TIA (transient ischemic attack) 03/10/2020   CKD (  chronic kidney disease) 08/28/2018   Retinopathy of both eyes 08/28/2018   Stroke due to embolism of left vertebral artery (HCC) 08/12/2018   Stroke (HCC) 08/08/2018   Diabetes mellitus (HCC) 07/25/2015   Neck injury 06/22/2015   Hearing loss in right ear 10/14/2011   Agent orange exposure 10/14/2011   ADD (attention deficit disorder) 10/14/2011   Hyperlipidemia 10/14/2011   BMI 28.0-28.9,adult 10/14/2011    Sallyanne Kuster, PTA, [facility] [address] [place], [place] [address] [phone] 03/13/21, 7:13 PM   Name: SHONTE SODERLUND MRN: [id number] Date of Birth: 12/12/1950

## 2021-03-14 ENCOUNTER — Ambulatory Visit: Payer: No Typology Code available for payment source | Admitting: Physical Therapy

## 2021-03-14 DIAGNOSIS — R2689 Other abnormalities of gait and mobility: Secondary | ICD-10-CM | POA: Diagnosis not present

## 2021-03-14 DIAGNOSIS — R2681 Unsteadiness on feet: Secondary | ICD-10-CM

## 2021-03-15 NOTE — Therapy (Signed)
[facility] [address] [place], [place], [address] Phone: [phone]   Fax:  [phone]  Physical Therapy Treatment  Patient Details  Name: Jeff [name] MRN: [id number] Date of Birth: July 02, 1950 Referring Provider (PT): Eulogio Bear, DO   Encounter Date: 03/14/2021   PT End of Session - 03/15/21 1519     Visit Number 8    Number of Visits 15   15 visits authorized by [facility]   Date for PT Re-Evaluation 04/07/21    Authorization Type VA - 15 visits    Authorization Time Period 02-14-21 - 04-16-21    Authorization - Visit Number 8    Authorization - Number of Visits 15    PT Start Time 9458    PT Stop Time 0931    PT Time Calculation (min) 44 min    Equipment Utilized During Treatment Gait belt   pool noodle, small barbell, blue foam wraps/weights   Activity Tolerance Patient tolerated treatment well    Behavior During Therapy WFL for tasks assessed/performed             Past Medical History:  Diagnosis Date   ADHD (attention deficit hyperactivity disorder)    Diabetes mellitus    Hyperlipidemia    Hypertension    Renal disorder    Stroke [facility])     Past Surgical History:  Procedure Laterality Date   IR ANGIO INTRA EXTRACRAN SEL COM CAROTID INNOMINATE BILAT MOD SED  01/03/2021   IR ANGIO VERTEBRAL SEL SUBCLAVIAN INNOMINATE UNI L MOD SED  01/03/2021   IR ANGIO VERTEBRAL SEL VERTEBRAL UNI R MOD SED  01/03/2021   IR RADIOLOGIST EVAL & MGMT  01/13/2021   IR US GUIDE VASC ACCESS RIGHT  01/03/2021   NECK SURGERY     SPINE SURGERY     Cervical spine x 2; Hardin Negus; Critzer.    There were no vitals filed for this visit.   Subjective Assessment - 03/15/21 1141     Subjective Pt reports the therapy is really helping - states he had a good pool session on Monday and did some exercises for his Rt shoulder which helped alot; had PT eval for Rt shoulder last week at [facility] in [place]    Patient is accompained by: Family  member   spouse [name]   Pertinent History h/o prior CVA (08-08-18):  h/o cervical fusion C5-7 (2012): h/o neck injury 2016: DM type II, HTN,  stage 3 CKD    Limitations Walking;House hold activities;Lifting    Diagnostic tests MRI and MRA on 01-01-21    Patient Stated Goals Improve balance; walk without falling    Currently in Pain? No/denies                               [facility] Adult PT Treatment/Exercise - 03/15/21 0001       Transfers   Transfers Sit to Stand;Stand to Sit    Sit to Stand 5: Supervision    Stand to Sit 5: Supervision    Number of Reps Other reps (comment)   5 reps   Comments from mat table - no UE support used  - feet on floor      Ambulation/Gait   Ambulation/Gait Yes    Ambulation/Gait Assistance 5: Supervision    Ambulation/Gait Assistance Details cues for upright posture    Ambulation Distance (Feet) 115 Feet    Assistive device None  Gait Pattern Step-through pattern    Ambulation Surface Level;Indoor    Gait velocity 10.81 secs = 3.03 ft/sec    Gait Comments pt amb. 115' around track tossing and catching ball with CGA; amb. 40' with CGA tossing and catching ball on Rt/Lt sides      Standardized Balance Assessment   Standardized Balance Assessment Timed Up and Go Test      Timed Up and Go Test   TUG Normal TUG    Normal TUG (seconds) 12   no device     Knee/Hip Exercises: Standing   Forward Step Up Right;1 set;10 reps;Hand Hold: 1;Step Height: 6"                 Balance Exercises - 03/15/21 0001       Balance Exercises: Standing   SLS Eyes open;Solid surface;Intermittent upper extremity support;2 reps   10 sec hold attempted   Partial Tandem Stance Eyes open;15 secs   each foot position - 1 rep each position - standing on floor   Marching Foam/compliant surface;Static;10 reps   CGA to min assist for balance recovery   Other Standing Exercises Performed alternate tap ups to 1st step 10 reps with each foot with CGA  without UE support on rail; performed 5 reps alternating tap ups to 2nd step with CGA to min assist    Other Standing Exercises Comments Pt performed standing balance exercises on red and blue mats on floor for compliant surface training for increased challenges with balance - stood on red mat and performed cone taps to 4 cones, progressed to tipping cone over and standing upright with min assist for balance; stood on blue mat and performed SLS activity with touching balance bubbles - straight ahead 3 reps each foot, then 2 reps diagonally with each foot with CGA to min assist                  PT Short Term Goals - 03/15/21 1520       PT SHORT TERM GOAL #1   Title Improve Berg score from 33/56 to >/= 37/56 for reduced fall risk.    Baseline 33/56 on 02-14-21    Time 4    Period Weeks    Status New    Target Date 03/17/21      PT SHORT TERM GOAL #2   Title Improve TUG score from 28.12 secs to </= 23 secs with no device for decr. fall risk.    Baseline 28.12 secs without device;  12.00 secs without device on 03-14-21    Time 4    Period Weeks    Status Achieved    Target Date 03/17/21      PT SHORT TERM GOAL #3   Title Incr. gait velocity from 1.18 ft/sec to >/= 1.5 ft/sec for incr. gait efficiency (without device).    Baseline 27.81 secs = 1.18 ft/sec without device ; 10.81 secs = 3.03 ft/sec without device - 03-14-21    Time 4    Period Weeks    Status Achieved    Target Date 03/17/21      PT SHORT TERM GOAL #4   Title Pt will perform sit to stand from mat table with 1 UE support to demo improved LE strength.    Baseline no UE support used on 03-14-21    Time 4    Period Weeks    Status Achieved    Target Date 03/17/21      PT SHORT  TERM GOAL #5   Title Independent in HEP for balance & strengthening exs.    Time 4    Period Weeks    Status Achieved    Target Date 03/17/21               PT Long Term Goals - 03/08/21 0954       PT LONG TERM GOAL #1    Title Increase Berg score to >/= 41/56 for reduced fall risk.    Baseline 33/56 on 02-13-21    Time 8    Period Weeks    Status New      PT LONG TERM GOAL #2   Title Improve TUG score from 28.12 secs to </= 20 secs without device to reduce fall risk.    Baseline 28.12 secs without device    Time 8    Period Weeks    Status New      PT LONG TERM GOAL #3   Title Pt will amb. 500' with SPC on even and uneven surfaces with supervision for incr. community accessibility.    Time 8    Period Weeks    Status New      PT LONG TERM GOAL #4   Title Perform sit to stand from mat table without UE support to demo improved LE strength.    Time 8    Period Weeks    Status New      PT LONG TERM GOAL #5   Title Independent in updated HEP for balance and strengthening.    Time 8    Period Weeks    Status New      PT LONG TERM GOAL #6   Title Increase Foto score from 53/100 to >/= 60/100 to demo improvement in functional status.    Baseline 53/100 on 02-13-21    Time 8    Period Weeks    Status New                   Plan - 03/15/21 1522     Clinical Impression Statement Pt has met STG's #2-5; STG #1 not assessed due to time constraint.  Pt is making excellent progress with improving balance and gait with less gait deviations exhibited.  Pt able to maintain balance on compliant surface with CGA to intermittent min assist for recovery of LOB.  Cont with POC.    Personal Factors and Comorbidities Age;Fitness;Comorbidity 2;Transportation;Past/Current Experience    Comorbidities h/o cervical fusion C5-7 (2012), h/o neck injury 2016, h/o prior CVA in March 2020, DM type II, HTN, stage 3 CKD    Examination-Activity Limitations Locomotion Level;Transfers;Bend;Stairs;Stand;Lift;Squat    Examination-Participation Restrictions Church;Cleaning;Meal Prep;Driving;Community Activity;Interpersonal Relationship;Laundry;Shop;Yard Work    Merchant navy officer Evolving/Moderate complexity     Rehab Potential Good    PT Frequency 2x / week    PT Duration 8 weeks    PT Treatment/Interventions ADLs/Self Care Home Management;Aquatic Therapy;Gait training;Stair training;Therapeutic activities;Therapeutic exercise;Balance training;DME Instruction;Neuromuscular re-education;Patient/family education    PT Next Visit Plan Aquatic exercise - balance and gait training;   NEXT LAND APPT - PLEASE CHECK STG #1 (Berg)  - cont balance and gait training - without device in clinic    PT Home Exercise Plan balance and sit to stand    Consulted and Agree with Plan of Care Patient;Family member/caregiver    Family Member Consulted [name]             Patient will benefit from skilled therapeutic intervention in order to  improve the following deficits and impairments:  Decreased balance, Decreased endurance, Decreased strength, Difficulty walking, Pain, Postural dysfunction  Visit Diagnosis: Other abnormalities of gait and mobility  Unsteadiness on feet     Problem List Patient Active Problem List   Diagnosis Date Noted   TIA (transient ischemic attack) 03/10/2020   CKD (chronic kidney disease) 08/28/2018   Retinopathy of both eyes 08/28/2018   Stroke due to embolism of left vertebral artery ([place]) 08/12/2018   Stroke ([place]) 08/08/2018   Diabetes mellitus ([place]) 07/25/2015   Neck injury 06/22/2015   Hearing loss in right ear 10/14/2011   Agent orange exposure 10/14/2011   ADD (attention deficit disorder) 10/14/2011   Hyperlipidemia 10/14/2011   BMI 28.0-28.9,adult 10/14/2011    Avonell Lenig, [place], PT 03/15/2021, 3:28 PM  [facility] [address] [place], [place], [address] Phone: [phone]   Fax:  [phone]  Name: Jeff [name] MRN: [id number] Date of Birth: 1950/10/10

## 2021-03-20 ENCOUNTER — Ambulatory Visit: Payer: No Typology Code available for payment source | Admitting: Physical Therapy

## 2021-03-21 ENCOUNTER — Encounter: Payer: Self-pay | Admitting: Physical Therapy

## 2021-03-21 ENCOUNTER — Ambulatory Visit: Payer: No Typology Code available for payment source | Admitting: Physical Therapy

## 2021-03-21 ENCOUNTER — Other Ambulatory Visit: Payer: Self-pay

## 2021-03-21 DIAGNOSIS — R293 Abnormal posture: Secondary | ICD-10-CM

## 2021-03-21 DIAGNOSIS — R2689 Other abnormalities of gait and mobility: Secondary | ICD-10-CM | POA: Diagnosis not present

## 2021-03-21 DIAGNOSIS — I69351 Hemiplegia and hemiparesis following cerebral infarction affecting right dominant side: Secondary | ICD-10-CM

## 2021-03-21 DIAGNOSIS — R2681 Unsteadiness on feet: Secondary | ICD-10-CM

## 2021-03-22 NOTE — Therapy (Signed)
[facility] [address] [place], [place], [address] Phone: [phone]   Fax:  [phone]  Physical Therapy Treatment  Patient Details  Name: Jeff [name] MRN: [id number] Date of Birth: [birth year] Referring Provider (PT): Eulogio Bear, DO   Encounter Date: 03/21/2021   PT End of Session - 03/21/21 0853     Visit Number 9    Number of Visits 15   15 visits authorized by [facility]   Date for PT Re-Evaluation 04/07/21    Authorization Type [facility] - 15 visits    Authorization Time Period 02-14-21 - 04-16-21    Authorization - Visit Number 9    Authorization - Number of Visits 15    PT Start Time 6168    PT Stop Time 0930    PT Time Calculation (min) 40 min    Equipment Utilized During Treatment Gait belt    Activity Tolerance Patient tolerated treatment well    Behavior During Therapy WFL for tasks assessed/performed             Past Medical History:  Diagnosis Date   ADHD (attention deficit hyperactivity disorder)    Diabetes mellitus    Hyperlipidemia    Hypertension    Renal disorder    Stroke [facility])     Past Surgical History:  Procedure Laterality Date   IR ANGIO INTRA EXTRACRAN SEL COM CAROTID INNOMINATE BILAT MOD SED  01/03/2021   IR ANGIO VERTEBRAL SEL SUBCLAVIAN INNOMINATE UNI L MOD SED  01/03/2021   IR ANGIO VERTEBRAL SEL VERTEBRAL UNI R MOD SED  01/03/2021   IR RADIOLOGIST EVAL & MGMT  01/13/2021   IR US GUIDE VASC ACCESS RIGHT  01/03/2021   NECK SURGERY     SPINE SURGERY     Cervical spine x 2; Hardin Negus; Critzer.    There were no vitals filed for this visit.   Subjective Assessment - 03/21/21 0852     Subjective No new complaints. No falls. [facility]    Patient is accompained by: Family member   spouse Jeff [name]   Pertinent History h/o prior CVA (08-08-18):  h/o cervical fusion C5-7 (2012): h/o neck injury 2016: DM type II, HTN,  stage 3 CKD    Limitations Walking;House hold  activities;Lifting    Diagnostic tests MRI and MRA on 01-01-21    Patient Stated Goals Improve balance; walk without falling    Currently in Pain? No/denies    Pain Score 0-No pain                [facility] PT Assessment - 03/21/21 0854       Berg Balance Test   Sit to Stand Able to stand without using hands and stabilize independently    Standing Unsupported Able to stand safely 2 minutes    Sitting with Back Unsupported but Feet Supported on Floor or Stool Able to sit safely and securely 2 minutes    Stand to Sit Sits safely with minimal use of hands    Transfers Able to transfer safely, minor use of hands    Standing Unsupported with Eyes Closed Able to stand 10 seconds with supervision    Standing Unsupported with Feet Together Able to place feet together independently and stand for 1 minute with supervision    From Standing, Reach Forward with Outstretched Arm Can reach confidently >25 cm (10")    From Standing Position, Pick up Object from Floor Able to pick up shoe safely  and easily    From Standing Position, Turn to Look Behind Over each Shoulder Looks behind from both sides and weight shifts well    Turn 360 Degrees Able to turn 360 degrees safely in 4 seconds or less   <4 sec's both ways   Standing Unsupported, Alternately Place Feet on Step/Stool Able to complete 4 steps without aid or supervision    Standing Unsupported, One Foot in Front Able to take small step independently and hold 30 seconds    Standing on One Leg Able to lift leg independently and hold equal to or more than 3 seconds    Total Score 48                   [facility] Adult PT Treatment/Exercise - 03/21/21 0854       Transfers   Transfers Sit to Stand;Stand to Sit    Sit to Stand 5: Supervision    Stand to Sit 5: Supervision      Ambulation/Gait   Ambulation/Gait Yes    Ambulation/Gait Assistance 5: Supervision    Ambulation Distance (Feet) --   around clinic with session   Assistive device  None    Gait Pattern Step-through pattern    Ambulation Surface Level;Indoor      Knee/Hip Exercises: Aerobic   Other Aerobic Scifit LE/UE's level 3.0 x 5 minutes with goal >/= 85 steps per minute for strengthening and activity tolerance.                 Balance Exercises - 03/21/21 0912       Balance Exercises: Standing   Rockerboard Anterior/posterior;Lateral;EO;EC;20 seconds;30 seconds;Other reps (comment);Intermittent UE support;Limitations    Rockerboard Limitations performed both ways on balance board with progressing to no UE uspport in anterior/posterior direction, light support needed at all times in lateral directions. rocking the board with emphasis on tall posture with EO, progressing to EC. Then holding the board steady for EC 30 sec's x 3 reps. min guard to min assist for balance.    Other Standing Exercises on airex with intermittent UE support on chair back for balance: heel<>toe raises, then alternating slow marching, min guard to min assist for balance.                  PT Short Term Goals - 03/22/21 1052       PT SHORT TERM GOAL #1   Title Improve Berg score from 33/56 to >/= 37/56 for reduced fall risk.    Baseline 03/21/21: 48/56 scored today    Status Achieved      PT SHORT TERM GOAL #2   Title Improve TUG score from 28.12 secs to </= 23 secs with no device for decr. fall risk.    Baseline 12.00 secs without device on 03-14-21    Status Achieved      PT SHORT TERM GOAL #3   Title Incr. gait velocity from 1.18 ft/sec to >/= 1.5 ft/sec for incr. gait efficiency (without device).    Baseline 10.81 secs = 3.03 ft/sec without device - 03-14-21    Status Achieved      PT SHORT TERM GOAL #4   Title Pt will perform sit to stand from mat table with 1 UE support to demo improved LE strength.    Baseline no UE support used on 03-14-21    Status Achieved      PT SHORT TERM GOAL #5   Title Independent in HEP for balance & strengthening exs.  Baseline 03/21/21: met with current program    Status Achieved    Target Date --               PT Long Term Goals - 03/08/21 0954       PT LONG TERM GOAL #1   Title Increase Berg score to >/= 41/56 for reduced fall risk.    Baseline 33/56 on 02-13-21    Time 8    Period Weeks    Status New      PT LONG TERM GOAL #2   Title Improve TUG score from 28.12 secs to </= 20 secs without device to reduce fall risk.    Baseline 28.12 secs without device    Time 8    Period Weeks    Status New      PT LONG TERM GOAL #3   Title Pt will amb. 500' with SPC on even and uneven surfaces with supervision for incr. community accessibility.    Time 8    Period Weeks    Status New      PT LONG TERM GOAL #4   Title Perform sit to stand from mat table without UE support to demo improved LE strength.    Time 8    Period Weeks    Status New      PT LONG TERM GOAL #5   Title Independent in updated HEP for balance and strengthening.    Time 8    Period Weeks    Status New      PT LONG TERM GOAL #6   Title Increase Foto score from 53/100 to >/= 60/100 to demo improvement in functional status.    Baseline 53/100 on 02-13-21    Time 8    Period Weeks    Status New                   Plan - 03/21/21 0853     Clinical Impression Statement Today's skilled session focused on progress toward remaining STGs with goals met. Pt improved Berg Balance Test to 48/56. Remainder of session focused on strengthening and balance training with rest breaks taken as needed due to fatigue. No other issues noted or reported in session. The pt is progressing toward goals and should benefit from continued PT to progress toward unmet goals.    Personal Factors and Comorbidities Age;Fitness;Comorbidity 2;Transportation;Past/Current Experience    Comorbidities h/o cervical fusion C5-7 (2012), h/o neck injury 2016, h/o prior CVA in March 2020, DM type II, HTN, stage 3 CKD    Examination-Activity Limitations  Locomotion Level;Transfers;Bend;Stairs;Stand;Lift;Squat    Examination-Participation Restrictions Church;Cleaning;Meal Prep;Driving;Community Activity;Interpersonal Relationship;Laundry;Shop;Yard Work    [employer]chant [employer] officer Evolving/Moderate complexity    Rehab Potential Good    PT Frequency 2x / week    PT Duration 8 weeks    PT Treatment/Interventions ADLs/Self Care Home Management;Aquatic Therapy;Gait training;Stair training;Therapeutic activities;Therapeutic exercise;Balance training;DME Instruction;Neuromuscular re-education;Patient/family education    PT Next Visit Plan Aquatic exercise - balance and gait training;    LAND APPTs   - cont balance and gait training - without device in clinic    PT Home Exercise Plan balance and sit to stand    Consulted and Agree with Plan of Care Patient;Family member/caregiver    Family Member Consulted Jeff [name]             Patient will benefit from skilled therapeutic intervention in order to improve the following deficits and impairments:  Decreased balance, Decreased endurance, Decreased strength, Difficulty  walking, Pain, Postural dysfunction  Visit Diagnosis: Other abnormalities of gait and mobility  Unsteadiness on feet  Abnormal posture  Hemiplegia and hemiparesis following cerebral infarction affecting right dominant side [facility])     Problem List Patient Active Problem List   Diagnosis Date Noted   TIA (transient ischemic attack) 03/10/2020   CKD (chronic kidney disease) 08/28/2018   Retinopathy of both eyes 08/28/2018   Stroke due to embolism of left vertebral artery ([place]) 08/12/2018   Stroke ([place]) 08/08/2018   Diabetes mellitus ([place]) 07/25/2015   Neck injury 06/22/2015   Hearing loss in right ear 10/14/2011   Agent orange exposure 10/14/2011   ADD (attention deficit disorder) 10/14/2011   Hyperlipidemia 10/14/2011   BMI 28.0-28.9,adult 10/14/2011    Willow Ora, PTA, [facility] Outpatient Neuro [facility] [address], [place], [place] [address] [phone] 03/22/21, 3:21 PM   Name: RHIAN FUNARI MRN: [id number] Date of Birth: 11-Jan-1951

## 2021-03-27 ENCOUNTER — Ambulatory Visit: Payer: No Typology Code available for payment source | Admitting: Physical Therapy

## 2021-03-27 ENCOUNTER — Other Ambulatory Visit: Payer: Self-pay

## 2021-03-27 DIAGNOSIS — R293 Abnormal posture: Secondary | ICD-10-CM

## 2021-03-27 DIAGNOSIS — I69351 Hemiplegia and hemiparesis following cerebral infarction affecting right dominant side: Secondary | ICD-10-CM

## 2021-03-27 DIAGNOSIS — R2681 Unsteadiness on feet: Secondary | ICD-10-CM

## 2021-03-27 DIAGNOSIS — R2689 Other abnormalities of gait and mobility: Secondary | ICD-10-CM | POA: Diagnosis not present

## 2021-03-28 ENCOUNTER — Ambulatory Visit: Payer: No Typology Code available for payment source | Admitting: Physical Therapy

## 2021-03-28 ENCOUNTER — Encounter: Payer: Self-pay | Admitting: Physical Therapy

## 2021-03-28 NOTE — Therapy (Signed)
[facility] [address] [place], [place], [address] Phone: [phone]   Fax:  [phone]  Physical Therapy Treatment  Patient Details  Name: Jeff [name] MRN: [id number] Date of Birth: 02-19-1951 Referring Provider (PT): Eulogio Bear, DO   Encounter Date: 03/27/2021   PT End of Session - 03/27/21 2212     Visit Number 10    Number of Visits 15   15 visits authorized by [facility]   Date for PT Re-Evaluation 04/07/21    Authorization Type [facility] - 15 visits    Authorization Time Period 02-14-21 - 04-16-21    Authorization - Visit Number 10    Authorization - Number of Visits 15    PT Start Time 1055    PT Stop Time 1140    PT Time Calculation (min) 45 min    Equipment Utilized During Treatment Gait belt    Activity Tolerance Patient tolerated treatment well    Behavior During Therapy WFL for tasks assessed/performed             Past Medical History:  Diagnosis Date   ADHD (attention deficit hyperactivity disorder)    Diabetes mellitus    Hyperlipidemia    Hypertension    Renal disorder    Stroke [facility])     Past Surgical History:  Procedure Laterality Date   IR ANGIO INTRA EXTRACRAN SEL COM CAROTID INNOMINATE BILAT MOD SED  01/03/2021   IR ANGIO VERTEBRAL SEL SUBCLAVIAN INNOMINATE UNI L MOD SED  01/03/2021   IR ANGIO VERTEBRAL SEL VERTEBRAL UNI R MOD SED  01/03/2021   IR RADIOLOGIST EVAL & MGMT  01/13/2021   IR US GUIDE VASC ACCESS RIGHT  01/03/2021   NECK SURGERY     SPINE SURGERY     Cervical spine x 2; Hardin Negus; Critzer.    There were no vitals filed for this visit.   Subjective Assessment - 03/27/21 2211     Subjective No new complaints. No falls or pain to report.    Patient is accompained by: Family member   spouse [name]   Pertinent History h/o prior CVA (08-08-18):  h/o cervical fusion C5-7 (2012): h/o neck injury 2016: DM type II, HTN,  stage 3 CKD    Limitations Walking;House hold activities;Lifting     Diagnostic tests MRI and MRA on 01-01-21    Patient Stated Goals Improve balance; walk without falling    Currently in Pain? No/denies    Pain Score 0-No pain             Aquatic therapy at [facility] - pool temp 94 degrees   Patient seen for aquatic therapy today.  Treatment took place in water 3.5-4.5 feet deep depending upon activity.  Pt entered and  Exited the pool via step negotiation with use of bil. Hands rails, step by step with descension and  ascension for exiting the pool.   In ~4.0 foot depth water Forward/backward gait across pool for 4 laps no AD. supervision Lateral stepping across pool for 4 laps each way with no AD, supervision Forward/backward toe walking for 4 laps with min guard to min assist for balance Tandem gait forward with use of large bar bell for balance for 4 laps, min guard to min assist for balance.  With 2.5# ankle weights: in ~4 foot depth water. Light UE support on pool ledge.  Alternating hip abduction, extension, and flexion for 15 reps each side. Then alternating Hamstring curls x 15, followed by alternating marching  for 15 reps each side.  Balance: across pool in 4 foot water With blue pool noodle- Forward marching with upper trunk rotation x 2 laps with min guard assist for balance With large bar bell:Forward maching bringing bar bell down to knee x2 laps  With hand held single bar bells: Forward gait with alternating punching for 2 laps across and back Side stepping with shoulder abd/add for 2 laps across and back      Pt requires buoyancy of water for support for reduced fall risk with gait training and balance exercises with minimal UE support; exercises able to be performed safely in water without the risk of fall compared to those same exercises performed on land;  viscosity of water needed for resistance for strengthening.  Current of water provides perturbations for challenging static & dynamic standing balance.           PT  Short Term Goals - 03/22/21 1052       PT SHORT TERM GOAL #1   Title Improve Berg score from 33/56 to >/= 37/56 for reduced fall risk.    Baseline 03/21/21: 48/56 scored today    Status Achieved      PT SHORT TERM GOAL #2   Title Improve TUG score from 28.12 secs to </= 23 secs with no device for decr. fall risk.    Baseline 12.00 secs without device on 03-14-21    Status Achieved      PT SHORT TERM GOAL #3   Title Incr. gait velocity from 1.18 ft/sec to >/= 1.5 ft/sec for incr. gait efficiency (without device).    Baseline 10.81 secs = 3.03 ft/sec without device - 03-14-21    Status Achieved      PT SHORT TERM GOAL #4   Title Pt will perform sit to stand from mat table with 1 UE support to demo improved LE strength.    Baseline no UE support used on 03-14-21    Status Achieved      PT SHORT TERM GOAL #5   Title Independent in HEP for balance & strengthening exs.    Baseline 03/21/21: met with current program    Status Achieved    Target Date --               PT Long Term Goals - 03/08/21 0954       PT LONG TERM GOAL #1   Title Increase Berg score to >/= 41/56 for reduced fall risk.    Baseline 33/56 on 02-13-21    Time 8    Period Weeks    Status New      PT LONG TERM GOAL #2   Title Improve TUG score from 28.12 secs to </= 20 secs without device to reduce fall risk.    Baseline 28.12 secs without device    Time 8    Period Weeks    Status New      PT LONG TERM GOAL #3   Title Pt will amb. 500' with SPC on even and uneven surfaces with supervision for incr. community accessibility.    Time 8    Period Weeks    Status New      PT LONG TERM GOAL #4   Title Perform sit to stand from mat table without UE support to demo improved LE strength.    Time 8    Period Weeks    Status New      PT LONG TERM GOAL #5   Title Independent  in updated HEP for balance and strengthening.    Time 8    Period Weeks    Status New      PT LONG TERM GOAL #6   Title  Increase Foto score from 53/100 to >/= 60/100 to demo improvement in functional status.    Baseline 53/100 on 02-13-21    Time 8    Period Weeks    Status New                   Plan - 03/27/21 2212     Clinical Impression Statement Today's skilled session continued to focus on strengthening, gait and balance in the aquatic setting with rest breaks taken as needed. No other issues noted or reported in session. The pt is progressing toward goals and should benefit from continued PT to progress toward unmet goals.    Personal Factors and Comorbidities Age;Fitness;Comorbidity 2;Transportation;Past/Current Experience    Comorbidities h/o cervical fusion C5-7 (2012), h/o neck injury 2016, h/o prior CVA in March 2020, DM type II, HTN, stage 3 CKD    Examination-Activity Limitations Locomotion Level;Transfers;Bend;Stairs;Stand;Lift;Squat    Examination-Participation Restrictions Church;Cleaning;Meal Prep;Driving;Community Activity;Interpersonal Relationship;Laundry;Shop;Yard Work    [employer]chant [employer] officer Evolving/Moderate complexity    Rehab Potential Good    PT Frequency 2x / week    PT Duration 8 weeks    PT Treatment/Interventions ADLs/Self Care Home Management;Aquatic Therapy;Gait training;Stair training;Therapeutic activities;Therapeutic exercise;Balance training;DME Instruction;Neuromuscular re-education;Patient/family education    PT Next Visit Plan Aquatic exercise - balance and gait training;    LAND APPTs   - cont balance and gait training - without device in clinic    PT Home Exercise Plan balance and sit to stand    Consulted and Agree with Plan of Care Patient;Family member/caregiver    Family Member Consulted [name]             Patient will benefit from skilled therapeutic intervention in order to improve the following deficits and impairments:  Decreased balance, Decreased endurance, Decreased strength, Difficulty walking, Pain, Postural dysfunction  Visit  Diagnosis: Other abnormalities of gait and mobility  Unsteadiness on feet  Abnormal posture  Hemiplegia and hemiparesis following cerebral infarction affecting right dominant side [facility])     Problem List Patient Active Problem List   Diagnosis Date Noted   TIA (transient ischemic attack) 03/10/2020   CKD (chronic kidney disease) 08/28/2018   Retinopathy of both eyes 08/28/2018   Stroke due to embolism of left vertebral artery ([place]) 08/12/2018   Stroke ([place]) 08/08/2018   Diabetes mellitus ([place]) 07/25/2015   Neck injury 06/22/2015   Hearing loss in right ear 10/14/2011   Agent orange exposure 10/14/2011   ADD (attention deficit disorder) 10/14/2011   Hyperlipidemia 10/14/2011   BMI 28.0-28.9,adult 10/14/2011   Willow Ora, PTA, [facility] Outpatient Neuro [facility] [address] [place], [place] [address] [phone] 03/28/21, 10:34 PM   Name: ROWLAND ERICSSON MRN: [id number] Date of Birth: 1950-12-19

## 2021-04-03 ENCOUNTER — Ambulatory Visit: Payer: No Typology Code available for payment source | Admitting: Physical Therapy

## 2021-04-03 ENCOUNTER — Encounter: Payer: Self-pay | Admitting: Physical Therapy

## 2021-04-03 NOTE — Therapy (Signed)
[facility] [address] [place], [place], [address] Phone: [phone]   Fax:  [phone]  Physical Therapy Treatment  Patient Details  Name: Jeff [name] MRN: [id number] Date of Birth: 05/26/1951 Referring Provider (PT): Jeff Bear, DO   Encounter Date: 04/03/2021   PT End of Session - 04/03/21 1121     Visit Number 10   no change, arrived and cancelled   Number of Visits 15   15 visits authorized by [facility]   Date for PT Re-Evaluation 04/07/21    Authorization Type VA - 15 visits    Authorization Time Period 02-14-21 - 04-16-21    Authorization - Visit Number 10    Authorization - Number of Visits 15    PT Start Time 6962    PT Stop Time 1100   arrived, no charge   PT Time Calculation (min) 12 min    Equipment Utilized During Treatment Gait belt    Activity Tolerance Patient tolerated treatment well    Behavior During Therapy WFL for tasks assessed/performed             Past Medical History:  Diagnosis Date   ADHD (attention deficit hyperactivity disorder)    Diabetes mellitus    Hyperlipidemia    Hypertension    Renal disorder    Stroke [facility])     Past Surgical History:  Procedure Laterality Date   IR ANGIO INTRA EXTRACRAN SEL COM CAROTID INNOMINATE BILAT MOD SED  01/03/2021   IR ANGIO VERTEBRAL SEL SUBCLAVIAN INNOMINATE UNI L MOD SED  01/03/2021   IR ANGIO VERTEBRAL SEL VERTEBRAL UNI R MOD SED  01/03/2021   IR RADIOLOGIST EVAL & MGMT  01/13/2021   IR US GUIDE VASC ACCESS RIGHT  01/03/2021   NECK SURGERY     SPINE SURGERY     Cervical spine x 2; Hardin Negus; Critzer.    There were no vitals filed for this visit.   Subjective Assessment - 04/03/21 1120     Subjective Pt met halfway down hallway on way to pool, propped against the wall using his quad cane with spouse next to him. Pt stating he is not feel good today and "I don't think I can do this today".    Patient is accompained by: Family member   spouse,  Jeff [name]   Pertinent History h/o prior CVA (08-08-18):  h/o cervical fusion C5-7 (2012): h/o neck injury 2016: DM type II, HTN,  stage 3 CKD    Limitations Walking;House hold activities;Lifting    Diagnostic tests MRI and MRA on 01-01-21    Patient Stated Goals Improve balance; walk without falling    Currently in Pain? No/denies    Pain Score 0-No pain                       PT Short Term Goals - 03/22/21 1052       PT SHORT TERM GOAL #1   Title Improve Berg score from 33/56 to >/= 37/56 for reduced fall risk.    Baseline 03/21/21: 48/56 scored today    Status Achieved      PT SHORT TERM GOAL #2   Title Improve TUG score from 28.12 secs to </= 23 secs with no device for decr. fall risk.    Baseline 12.00 secs without device on 03-14-21    Status Achieved      PT SHORT TERM GOAL #3   Title Incr. gait velocity from 1.18 ft/sec to >/=  1.5 ft/sec for incr. gait efficiency (without device).    Baseline 10.81 secs = 3.03 ft/sec without device - 03-14-21    Status Achieved      PT SHORT TERM GOAL #4   Title Pt will perform sit to stand from mat table with 1 UE support to demo improved LE strength.    Baseline no UE support used on 03-14-21    Status Achieved      PT SHORT TERM GOAL #5   Title Independent in HEP for balance & strengthening exs.    Baseline 03/21/21: met with current program    Status Achieved    Target Date --               PT Long Term Goals - 03/08/21 0954       PT LONG TERM GOAL #1   Title Increase Berg score to >/= 41/56 for reduced fall risk.    Baseline 33/56 on 02-13-21    Time 8    Period Weeks    Status New      PT LONG TERM GOAL #2   Title Improve TUG score from 28.12 secs to </= 20 secs without device to reduce fall risk.    Baseline 28.12 secs without device    Time 8    Period Weeks    Status New      PT LONG TERM GOAL #3   Title Pt will amb. 500' with SPC on even and uneven surfaces with supervision for incr. community  accessibility.    Time 8    Period Weeks    Status New      PT LONG TERM GOAL #4   Title Perform sit to stand from mat table without UE support to demo improved LE strength.    Time 8    Period Weeks    Status New      PT LONG TERM GOAL #5   Title Independent in updated HEP for balance and strengthening.    Time 8    Period Weeks    Status New      PT LONG TERM GOAL #6   Title Increase Foto score from 53/100 to >/= 60/100 to demo improvement in functional status.    Baseline 53/100 on 02-13-21    Time 8    Period Weeks    Status New                   Plan - 04/03/21 1122     Clinical Impression Statement Pt met in hallway as PTA was walking towards pool. Pt leaning on wall, stating he did not feel good. Pt then stated he needed to sit down. Min to mod assist to walk ~20 feet to [facility] lobby for pt to sit down with use of small quad cane. Pt then needed supervision to min guard assist to transfer to wheelchair and escorted to car via wheelchair. Min guard assist to get into car. Pt's spouse stated she would call MD if pt not getting better. Blood glucose checked during this time and within pt's normal ranges. Pt stated he was unsure why he felt so run down today as he was not that busy yesterday, stated "I just woke up feeling this way". Aquatic session cancelled. Pt is scheduled for land session tomorrow. He is to call if he is not feeling better.    Personal Factors and Comorbidities Age;Fitness;Comorbidity 2;Transportation;Past/Current Experience    Comorbidities h/o cervical fusion C5-7 (2012),  h/o neck injury 2016, h/o prior CVA in March 2020, DM type II, HTN, stage 3 CKD    Examination-Activity Limitations Locomotion Level;Transfers;Bend;Stairs;Stand;Lift;Squat    Examination-Participation Restrictions Church;Cleaning;Meal Prep;Driving;Community Activity;Interpersonal Relationship;Laundry;Shop;Yard Work    [employer] officer Evolving/Moderate  complexity    Rehab Potential Good    PT Frequency 2x / week    PT Duration 8 weeks    PT Treatment/Interventions ADLs/Self Care Home Management;Aquatic Therapy;Gait training;Stair training;Therapeutic activities;Therapeutic exercise;Balance training;DME Instruction;Neuromuscular re-education;Patient/family education    PT Next Visit Plan Aquatic exercise - balance and gait training;    LAND APPTs   - cont balance and gait training - without device in clinic    PT Home Exercise Plan balance and sit to stand    Consulted and Agree with Plan of Care Patient;Family member/caregiver    Family Member Consulted Jeff [name]             Patient will benefit from skilled therapeutic intervention in order to improve the following deficits and impairments:  Decreased balance, Decreased endurance, Decreased strength, Difficulty walking, Pain, Postural dysfunction  Visit Diagnosis: Other abnormalities of gait and mobility  Unsteadiness on feet  Abnormal posture  Hemiplegia and hemiparesis following cerebral infarction affecting right dominant side [facility])     Problem List Patient Active Problem List   Diagnosis Date Noted   TIA (transient ischemic attack) 03/10/2020   CKD (chronic kidney disease) 08/28/2018   Retinopathy of both eyes 08/28/2018   Stroke due to embolism of left vertebral artery ([place]) 08/12/2018   Stroke ([place]) 08/08/2018   Diabetes mellitus ([place]) 07/25/2015   Neck injury 06/22/2015   Hearing loss in right ear 10/14/2011   Agent orange exposure 10/14/2011   ADD (attention deficit disorder) 10/14/2011   Hyperlipidemia 10/14/2011   BMI 28.0-28.9,adult 10/14/2011    Willow Ora, PTA, [facility] [address] [place], [place] [address] [phone] 04/03/21, 11:27 AM   Name: Jeff [name] MRN: [id number] Date of Birth: 11/14/1950

## 2021-04-04 ENCOUNTER — Encounter: Payer: Self-pay | Admitting: Physical Therapy

## 2021-04-04 ENCOUNTER — Ambulatory Visit: Payer: No Typology Code available for payment source | Attending: Internal Medicine | Admitting: Physical Therapy

## 2021-04-04 ENCOUNTER — Other Ambulatory Visit: Payer: Self-pay

## 2021-04-04 VITALS — BP 140/72 | HR 87

## 2021-04-04 DIAGNOSIS — R293 Abnormal posture: Secondary | ICD-10-CM | POA: Insufficient documentation

## 2021-04-04 DIAGNOSIS — R2689 Other abnormalities of gait and mobility: Secondary | ICD-10-CM | POA: Insufficient documentation

## 2021-04-04 DIAGNOSIS — I69351 Hemiplegia and hemiparesis following cerebral infarction affecting right dominant side: Secondary | ICD-10-CM | POA: Diagnosis present

## 2021-04-04 DIAGNOSIS — R2681 Unsteadiness on feet: Secondary | ICD-10-CM | POA: Insufficient documentation

## 2021-04-04 NOTE — Therapy (Signed)
[facility] [address] [place], [place], [address] Phone: [phone]   Fax:  [phone]  Physical Therapy Treatment  Patient Details  Name: Jeff [name] MRN: [id number] Date of Birth: [birth year] Referring Provider (PT): Eulogio Bear, DO   Encounter Date: 04/04/2021   PT End of Session - 04/04/21 0916     Visit Number 11    Number of Visits 15   15 visits authorized by [facility]   Date for PT Re-Evaluation 04/07/21    Authorization Type VA - 15 visits    Authorization Time Period 02-14-21 - 04-16-21    Authorization - Visit Number 11    Authorization - Number of Visits 15    PT Start Time 2703    PT Stop Time 0930    PT Time Calculation (min) 40 min    Equipment Utilized During Treatment Gait belt    Activity Tolerance Patient tolerated treatment well    Behavior During Therapy WFL for tasks assessed/performed             Past Medical History:  Diagnosis Date   ADHD (attention deficit hyperactivity disorder)    Diabetes mellitus    Hyperlipidemia    Hypertension    Renal disorder    Stroke [facility])     Past Surgical History:  Procedure Laterality Date   IR ANGIO INTRA EXTRACRAN SEL COM CAROTID INNOMINATE BILAT MOD SED  01/03/2021   IR ANGIO VERTEBRAL SEL SUBCLAVIAN INNOMINATE UNI L MOD SED  01/03/2021   IR ANGIO VERTEBRAL SEL VERTEBRAL UNI R MOD SED  01/03/2021   IR RADIOLOGIST EVAL & MGMT  01/13/2021   IR US GUIDE VASC ACCESS RIGHT  01/03/2021   NECK SURGERY     SPINE SURGERY     Cervical spine x 2; Hardin Negus; Critzer.    Vitals:   04/04/21 0919  BP: 140/72  Pulse: 87     Subjective Assessment - 04/04/21 0854     Subjective Pt reports he is feeling better today than he was at pool yesterday (unable to perform aquatic therapy due to weakness- wheelchair was obtained for patient to get from pool area back to his car)  - wonders if he had a TIA.  Wife states he went home and slept for a few hours and felt better  when he woke up. Pt states he fell in bathroom last Monday, the 24th, after pool session - lost his balance while pulling pants up.    Patient is accompained by: Family member   spouse, Jeff [name]   Pertinent History h/o prior CVA (08-08-18):  h/o cervical fusion C5-7 (2012): h/o neck injury 2016: DM type II, HTN,  stage 3 CKD    Limitations Walking;House hold activities;Lifting    Diagnostic tests MRI and MRA on 01-01-21    Patient Stated Goals Improve balance; walk without falling    Currently in Pain? No/denies                               [facility] Adult PT Treatment/Exercise - 04/04/21 1032       Transfers   Transfers Sit to Stand;Stand to Sit    Sit to Stand 4: Min guard;3: Mod assist   min guard with feet on floor; mod assist with feet on Airex   Stand to Sit 5: Supervision    Comments 5 reps feet on floor;  5 reps x 2 sets feet on  Airex      Ambulation/Gait   Ambulation/Gait Yes    Ambulation/Gait Assistance 4: Min guard    Ambulation/Gait Assistance Details --   cues to increase arm swing, incr. step length   Ambulation Distance (Feet) 115 Feet    Assistive device None    Gait Pattern Step-through pattern    Ambulation Surface Level;Indoor      Knee/Hip Exercises: Standing   Heel Raises Both;1 set;10 reps   RLE unilateral 10 reps with UE support   Forward Step Up Right;1 set;10 reps;Hand Hold: 2;Step Height: 6"    Step Down Right;1 set;10 reps;Hand Hold: 2;Step Height: 6"                 Balance Exercises - 04/04/21 0001       Balance Exercises: Standing   Other Standing Exercises Pt performed stepping over/back of lower height orange hurdle 5 reps each leg with LUE support on counter; 2nd set of 5 reps with 2 finger support on counter with min assist for balance    Other Standing Exercises Comments Pt performed touching balance bubbles - 5 reps straight ahead and then diagonals with each foot with min to mod assist for balance; progressed to touching  top of cone - striaght ahead 5 reps each foot;            Tap ups 5 reps with each foot to 1st step with bil. UE support; 1 UE support 5 reps with each foot to 1st step  2# weight used on RLE - pt performed tap ups to 1st step 10 reps; 10 reps to 2nd step with 2# weight with bil. UE support      PT Short Term Goals - 04/04/21 1046       PT SHORT TERM GOAL #1   Title Improve Berg score from 33/56 to >/= 37/56 for reduced fall risk.    Baseline 03/21/21: 48/56 scored today    Status Achieved      PT SHORT TERM GOAL #2   Title Improve TUG score from 28.12 secs to </= 23 secs with no device for decr. fall risk.    Baseline 12.00 secs without device on 03-14-21    Status Achieved      PT SHORT TERM GOAL #3   Title Incr. gait velocity from 1.18 ft/sec to >/= 1.5 ft/sec for incr. gait efficiency (without device).    Baseline 10.81 secs = 3.03 ft/sec without device - 03-14-21    Status Achieved      PT SHORT TERM GOAL #4   Title Pt will perform sit to stand from mat table with 1 UE support to demo improved LE strength.    Baseline no UE support used on 03-14-21    Status Achieved      PT SHORT TERM GOAL #5   Title Independent in HEP for balance & strengthening exs.    Baseline 03/21/21: met with current program    Status Achieved               PT Long Term Goals - 04/04/21 0856       PT LONG TERM GOAL #1   Title Increase Berg score to >/= 41/56 for reduced fall risk.    Baseline 33/56 on 02-13-21    Time 8    Period Weeks    Status New      PT LONG TERM GOAL #2   Title Improve TUG score from 28.12 secs to </= 20  secs without device to reduce fall risk.    Baseline 28.12 secs without device    Time 8    Period Weeks    Status New      PT LONG TERM GOAL #3   Title Pt will amb. 500' with SPC on even and uneven surfaces with supervision for incr. community accessibility.    Time 8    Period Weeks    Status New      PT LONG TERM GOAL #4   Title Perform sit  to stand from mat table without UE support to demo improved LE strength.    Time 8    Period Weeks    Status New      PT LONG TERM GOAL #5   Title Independent in updated HEP for balance and strengthening.    Time 8    Period Weeks    Status New      PT LONG TERM GOAL #6   Title Increase Foto score from 53/100 to >/= 60/100 to demo improvement in functional status.    Baseline 53/100 on 02-13-21    Time 8    Period Weeks    Status New                   Plan - 04/04/21 0856     Clinical Impression Statement Pt's RLE noted to be slightly weaker today compared to that in previous land session on 03-14-21.  Pt had moderate LOB with sit to stand exercise with feet on Airex, requiring mod assist on several reps to prevent LOB/fall.  Pt also had difficulty performing tap ups onto 1st step with LLE due to difficulty performing RLE SLS.  Pt was amb. without use of quad cane today, which was much improved compared to status on 04-03-21 when he presented to pool for aquatic therapy session.  Pt's symptoms with episode on 04-03-21 are consistent with possible TIA.  Cont with POC.    Personal Factors and Comorbidities Age;Fitness;Comorbidity 2;Transportation;Past/Current Experience    Comorbidities h/o cervical fusion C5-7 (2012), h/o neck injury 2016, h/o prior CVA in March 2020, DM type II, HTN, stage 3 CKD    Examination-Activity Limitations Locomotion Level;Transfers;Bend;Stairs;Stand;Lift;Squat    Examination-Participation Restrictions Church;Cleaning;Meal Prep;Driving;Community Activity;Interpersonal Relationship;Laundry;Shop;Yard Work    [employer] officer Evolving/Moderate complexity    Rehab Potential Good    PT Frequency 2x / week    PT Duration 8 weeks    PT Treatment/Interventions ADLs/Self Care Home Management;Aquatic Therapy;Gait training;Stair training;Therapeutic activities;Therapeutic exercise;Balance training;DME Instruction;Neuromuscular  re-education;Patient/family education    PT Next Visit Plan Aquatic exercise - balance and gait training;    LAND APPTs   - check LTG's on 04-13-21 and renew ; cont balance and gait training - without device in clinic    PT Home Exercise Plan balance and sit to stand    Consulted and Agree with Plan of Care Patient;Family member/caregiver    Family Member Consulted Jeff [name]             Patient will benefit from skilled therapeutic intervention in order to improve the following deficits and impairments:  Decreased balance, Decreased endurance, Decreased strength, Difficulty walking, Pain, Postural dysfunction  Visit Diagnosis: Other abnormalities of gait and mobility  Unsteadiness on feet  Hemiplegia and hemiparesis following cerebral infarction affecting right dominant side [facility])     Problem List Patient Active Problem List   Diagnosis Date Noted   TIA (transient ischemic attack) 03/10/2020   CKD (chronic kidney disease)  08/28/2018   Retinopathy of both eyes 08/28/2018   Stroke due to embolism of left vertebral artery ([place]) 08/12/2018   Stroke ([place]) 08/08/2018   Diabetes mellitus ([place]) 07/25/2015   Neck injury 06/22/2015   Hearing loss in right ear 10/14/2011   Agent orange exposure 10/14/2011   ADD (attention deficit disorder) 10/14/2011   Hyperlipidemia 10/14/2011   BMI 28.0-28.9,adult 10/14/2011    Carmino Ocain, [place], PT 04/04/2021, 10:50 AM  [facility] [address] [place], [place], [address] Phone: [phone]   Fax:  [phone]  Name: GATLIN KITTELL MRN: [id number] Date of Birth: 1951/01/09

## 2021-04-10 ENCOUNTER — Ambulatory Visit: Payer: No Typology Code available for payment source | Admitting: Physical Therapy

## 2021-04-11 ENCOUNTER — Ambulatory Visit: Payer: No Typology Code available for payment source | Admitting: Physical Therapy

## 2021-04-11 DIAGNOSIS — R2689 Other abnormalities of gait and mobility: Secondary | ICD-10-CM

## 2021-04-11 DIAGNOSIS — R293 Abnormal posture: Secondary | ICD-10-CM

## 2021-04-11 DIAGNOSIS — R2681 Unsteadiness on feet: Secondary | ICD-10-CM

## 2021-04-11 DIAGNOSIS — I69351 Hemiplegia and hemiparesis following cerebral infarction affecting right dominant side: Secondary | ICD-10-CM

## 2021-04-11 NOTE — Therapy (Signed)
[facility] [address] [place], [place], [address] Phone: [phone]   Fax:  [phone]  Physical Therapy Treatment  Patient Details  Name: Jeff [name] MRN: [id number] Date of Birth: Mar 16, 1951 Referring Provider (PT): Eulogio Bear, DO   Encounter Date: 04/11/2021   PT End of Session - 04/11/21 1349     Visit Number 12    Number of Visits 15   15 visits authorized by [facility]   Date for PT Re-Evaluation 04/16/21    Authorization Type VA - 15 visits    Authorization Time Period 02-14-21 - 04-16-21    Authorization - Visit Number 12    Authorization - Number of Visits 15    PT Start Time 9381    PT Stop Time 0175    PT Time Calculation (min) 40 min    Equipment Utilized During Treatment --   pool step, foam noodles   Activity Tolerance Patient tolerated treatment well    Behavior During Therapy WFL for tasks assessed/performed             Past Medical History:  Diagnosis Date   ADHD (attention deficit hyperactivity disorder)    Diabetes mellitus    Hyperlipidemia    Hypertension    Renal disorder    Stroke [facility])     Past Surgical History:  Procedure Laterality Date   IR ANGIO INTRA EXTRACRAN SEL COM CAROTID INNOMINATE BILAT MOD SED  01/03/2021   IR ANGIO VERTEBRAL SEL SUBCLAVIAN INNOMINATE UNI L MOD SED  01/03/2021   IR ANGIO VERTEBRAL SEL VERTEBRAL UNI R MOD SED  01/03/2021   IR RADIOLOGIST EVAL & MGMT  01/13/2021   IR US GUIDE VASC ACCESS RIGHT  01/03/2021   NECK SURGERY     SPINE SURGERY     Cervical spine x 2; Hardin Negus; Critzer.    There were no vitals filed for this visit.   Subjective Assessment - 04/11/21 1348     Subjective Pt and wife report no episodes since last time coming to pool.  Pt arrived via transport wheelchair.  Has not had a f/u with physician.  Wife thinks it was related to CBG; reports pt ate a good breakfast and CBG is currently 105.    Patient is accompained by: Family member    spouse, [name]   Pertinent History h/o prior CVA (08-08-18):  h/o cervical fusion C5-7 (2012): h/o neck injury 2016: DM type II, HTN,  stage 3 CKD    Limitations Walking;House hold activities;Lifting    Diagnostic tests MRI and MRA on 01-01-21    Patient Stated Goals Improve balance; walk without falling    Currently in Pain? No/denies              Patient seen for aquatic therapy today.  Treatment took place in water 3-4 feet deep depending upon activity.  Pt entered and exited the pool via stairs with bilat rails.   Warm up: walking forwards, backwards, laterally in 4.5 ft/water.    Performed forward step up<>forward step downs on submerged step for ankle ROM, weight shifting, LE strengthening and SLS training: performed 8 reps alternating LE with high knee march at the top.  Intermittent assistance from PT to prevent posterior LOB when stepping up or holding SLS; required one UE support for balance.  Turned and performed 8 reps lateral step up<>down to each side with UE support on side of pool.    SLS training standing with back to wall and placing  one foot on noodle - allowed water and noodle to lift one LE while balancing on contralateral LE; cued to push noodle down with control and let it return upwards with control; performed 8 reps each LE with bilat UE support on wall.  Tandem walking across width of pool in 4.3 ft of water holding noodle in front for balance; performed two laps of tandem with gaze forwards, third lap added in head turns to L and R; therapist provided min A for balance.  Performed static standing balance training in 4.3 ft of water standing with feet together with EC - required UE support on wall on L to maintain balance due to frequent LOB posterior and to the R.  Added in head turns and nods x 10 reps.  Pt requested to stop intermittently due to pain in top of ankle; guided pt through dorsal ankle stretches.  Pt requires buoyancy of water for support to reduce fall  risk with gait training and balance exercises with minimal UE support; exercises able to be performed safely in water without the risk of fall compared to those same exercises performed on land; viscosity of water needed for resistance for strengthening.  Current of water provides perturbations for challenging static & dynamic standing balance.     PT Short Term Goals - 04/04/21 1046       PT SHORT TERM GOAL #1   Title Improve Berg score from 33/56 to >/= 37/56 for reduced fall risk.    Baseline 03/21/21: 48/56 scored today    Status Achieved      PT SHORT TERM GOAL #2   Title Improve TUG score from 28.12 secs to </= 23 secs with no device for decr. fall risk.    Baseline 12.00 secs without device on 03-14-21    Status Achieved      PT SHORT TERM GOAL #3   Title Incr. gait velocity from 1.18 ft/sec to >/= 1.5 ft/sec for incr. gait efficiency (without device).    Baseline 10.81 secs = 3.03 ft/sec without device - 03-14-21    Status Achieved      PT SHORT TERM GOAL #4   Title Pt will perform sit to stand from mat table with 1 UE support to demo improved LE strength.    Baseline no UE support used on 03-14-21    Status Achieved      PT SHORT TERM GOAL #5   Title Independent in HEP for balance & strengthening exs.    Baseline 03/21/21: met with current program    Status Achieved               PT Long Term Goals - 04/04/21 0856       PT LONG TERM GOAL #1   Title Increase Berg score to >/= 41/56 for reduced fall risk.    Baseline 33/56 on 02-13-21    Time 8    Period Weeks    Status New      PT LONG TERM GOAL #2   Title Improve TUG score from 28.12 secs to </= 20 secs without device to reduce fall risk.    Baseline 28.12 secs without device    Time 8    Period Weeks    Status New      PT LONG TERM GOAL #3   Title Pt will amb. 500' with SPC on even and uneven surfaces with supervision for incr. community accessibility.    Time 8    Period Weeks  Status New       PT LONG TERM GOAL #4   Title Perform sit to stand from mat table without UE support to demo improved LE strength.    Time 8    Period Weeks    Status New      PT LONG TERM GOAL #5   Title Independent in updated HEP for balance and strengthening.    Time 8    Period Weeks    Status New      PT LONG TERM GOAL #6   Title Increase Foto score from 53/100 to >/= 60/100 to demo improvement in functional status.    Baseline 53/100 on 02-13-21    Time 8    Period Weeks    Status New                   Plan - 04/11/21 1350     Clinical Impression Statement Continued to utilize aquatic therapy for LE strengthening and dynamic balance training.  Pt reported increased pain/cramping in anterior lower leg and ankles, relieved with stretches.  At end of session pt reporting significant fatigue but able to exit pool unassisted and perform shower/clothing change without assistance.  After exiting locker room pt's CBG reading dropped into lower 60's.  Wife provided pt with snack and glucose packet.  Pt able to leave with wife without difficulty.    Personal Factors and Comorbidities Age;Fitness;Comorbidity 2;Transportation;Past/Current Experience    Comorbidities h/o cervical fusion C5-7 (2012), h/o neck injury 2016, h/o prior CVA in March 2020, DM type II, HTN, stage 3 CKD    Examination-Activity Limitations Locomotion Level;Transfers;Bend;Stairs;Stand;Lift;Squat    Examination-Participation Restrictions Church;Cleaning;Meal Prep;Driving;Community Activity;Interpersonal Relationship;Laundry;Shop;Yard Work    [employer]chant [employer] officer Evolving/Moderate complexity    Rehab Potential Good    PT Frequency 2x / week    PT Duration 8 weeks    PT Treatment/Interventions ADLs/Self Care Home Management;Aquatic Therapy;Gait training;Stair training;Therapeutic activities;Therapeutic exercise;Balance training;DME Instruction;Neuromuscular re-education;Patient/family education    PT Next Visit Plan  [facility] APPTs   - check LTG's on 04-13-21 and submit to [place] For more visits; cont balance and gait training - without device in clinic.  Continue aquatic training: gait and balance, tandem gait, activities with EC    PT Home Exercise Plan balance and sit to stand    Consulted and Agree with Plan of Care Patient;Family member/caregiver    Family Member Consulted [name]             Patient will benefit from skilled therapeutic intervention in order to improve the following deficits and impairments:  Decreased balance, Decreased endurance, Decreased strength, Difficulty walking, Pain, Postural dysfunction  Visit Diagnosis: Other abnormalities of gait and mobility  Unsteadiness on feet  Hemiplegia and hemiparesis following cerebral infarction affecting right dominant side (HCC)  Abnormal posture     Problem List Patient Active Problem List   Diagnosis Date Noted   TIA (transient ischemic attack) 03/10/2020   CKD (chronic kidney disease) 08/28/2018   Retinopathy of both eyes 08/28/2018   Stroke due to embolism of left vertebral artery ([place]) 08/12/2018   Stroke ([place]) 08/08/2018   Diabetes mellitus ([place]) 07/25/2015   Neck injury 06/22/2015   Hearing loss in right ear 10/14/2011   Agent orange exposure 10/14/2011   ADD (attention deficit disorder) 10/14/2011   Hyperlipidemia 10/14/2011   BMI 28.0-28.9,adult 10/14/2011   Rico Junker, PT, DPT 04/11/21    1:54 PM   [facility] [address]  [place], [place], [address] Phone: [phone]   Fax:  [phone]  Name: MICA RELEFORD MRN: [id number] Date of Birth: 1951-01-09

## 2021-04-13 ENCOUNTER — Ambulatory Visit: Payer: No Typology Code available for payment source | Admitting: Physical Therapy

## 2021-04-13 ENCOUNTER — Other Ambulatory Visit: Payer: Self-pay

## 2021-04-13 DIAGNOSIS — E1122 Type 2 diabetes mellitus with diabetic chronic kidney disease: Secondary | ICD-10-CM | POA: Diagnosis not present

## 2021-04-13 DIAGNOSIS — I639 Cerebral infarction, unspecified: Secondary | ICD-10-CM | POA: Diagnosis not present

## 2021-04-13 DIAGNOSIS — N1831 Chronic kidney disease, stage 3a: Secondary | ICD-10-CM | POA: Diagnosis not present

## 2021-04-13 DIAGNOSIS — I129 Hypertensive chronic kidney disease with stage 1 through stage 4 chronic kidney disease, or unspecified chronic kidney disease: Secondary | ICD-10-CM | POA: Diagnosis not present

## 2021-04-18 ENCOUNTER — Ambulatory Visit: Payer: No Typology Code available for payment source | Admitting: Physical Therapy

## 2021-05-09 DIAGNOSIS — E1165 Type 2 diabetes mellitus with hyperglycemia: Secondary | ICD-10-CM | POA: Diagnosis not present

## 2021-05-09 DIAGNOSIS — I10 Essential (primary) hypertension: Secondary | ICD-10-CM | POA: Diagnosis not present

## 2021-05-09 DIAGNOSIS — E039 Hypothyroidism, unspecified: Secondary | ICD-10-CM | POA: Diagnosis not present

## 2021-05-09 DIAGNOSIS — N189 Chronic kidney disease, unspecified: Secondary | ICD-10-CM | POA: Diagnosis not present

## 2021-05-09 DIAGNOSIS — E7800 Pure hypercholesterolemia, unspecified: Secondary | ICD-10-CM | POA: Diagnosis not present

## 2021-05-09 DIAGNOSIS — I639 Cerebral infarction, unspecified: Secondary | ICD-10-CM | POA: Diagnosis not present

## 2021-05-18 ENCOUNTER — Ambulatory Visit: Payer: No Typology Code available for payment source | Attending: Internal Medicine | Admitting: Physical Therapy

## 2021-05-18 ENCOUNTER — Other Ambulatory Visit: Payer: Self-pay

## 2021-05-18 DIAGNOSIS — R293 Abnormal posture: Secondary | ICD-10-CM | POA: Diagnosis present

## 2021-05-18 DIAGNOSIS — M6281 Muscle weakness (generalized): Secondary | ICD-10-CM | POA: Insufficient documentation

## 2021-05-18 DIAGNOSIS — R2689 Other abnormalities of gait and mobility: Secondary | ICD-10-CM | POA: Insufficient documentation

## 2021-05-18 DIAGNOSIS — I69351 Hemiplegia and hemiparesis following cerebral infarction affecting right dominant side: Secondary | ICD-10-CM | POA: Diagnosis present

## 2021-05-18 DIAGNOSIS — R2681 Unsteadiness on feet: Secondary | ICD-10-CM | POA: Diagnosis present

## 2021-05-19 ENCOUNTER — Encounter: Payer: Self-pay | Admitting: Physical Therapy

## 2021-05-19 NOTE — Therapy (Signed)
[facility] [address] [place], [place], [address] Phone: [phone]   Fax:  [phone]  Physical Therapy Treatment  Patient Details  Name: Jeff [name] MRN: [id number] Date of Birth: 07-Dec-1950 Referring Provider (PT): Eulogio Bear, DO   Encounter Date: 05/18/2021   PT End of Session - 05/19/21 1030     Visit Number 13   visit 1/15 for additional auth from [place]   Number of Visits 27   15 visits authorized by [facility]   Date for PT Re-Evaluation 07/14/21    Authorization Type VA - 15 visits    Authorization Time Period 02-14-21 - 04-16-21; additional auth 05-10-21 - 09-07-21    Authorization - Visit Number 1    Authorization - Number of Visits 15    PT Start Time 0100    PT Stop Time 7121    PT Time Calculation (min) 44 min    Equipment Utilized During Treatment --   pool step, foam noodles   Activity Tolerance Patient tolerated treatment well    Behavior During Therapy WFL for tasks assessed/performed             Past Medical History:  Diagnosis Date   ADHD (attention deficit hyperactivity disorder)    Diabetes mellitus    Hyperlipidemia    Hypertension    Renal disorder    Stroke [facility])     Past Surgical History:  Procedure Laterality Date   IR ANGIO INTRA EXTRACRAN SEL COM CAROTID INNOMINATE BILAT MOD SED  01/03/2021   IR ANGIO VERTEBRAL SEL SUBCLAVIAN INNOMINATE UNI L MOD SED  01/03/2021   IR ANGIO VERTEBRAL SEL VERTEBRAL UNI R MOD SED  01/03/2021   IR RADIOLOGIST EVAL & MGMT  01/13/2021   IR US GUIDE VASC ACCESS RIGHT  01/03/2021   NECK SURGERY     SPINE SURGERY     Cervical spine x 2; Hardin Negus; Critzer.    There were no vitals filed for this visit.   Subjective Assessment - 05/18/21 1409     Subjective Pt reports his [facility] MD did a walking test and signed him up for a handicap placard; feels balance has declined sincehe was here a month ago; states he has fallen twice since in the past week - both times were at  the front steps at his house (no rail present); pt states he finished up the PT for his shoulder yesterday    Patient is accompained by: Family member   spouse, Jeff [name]   Pertinent History h/o prior CVA (08-08-18):  h/o cervical fusion C5-7 (2012): h/o neck injury 2016: DM type II, HTN,  stage 3 CKD    Limitations Walking;House hold activities;Lifting    Diagnostic tests MRI and MRA on 01-01-21    Patient Stated Goals Improve balance; walk without falling    Currently in Pain? No/denies                [facility] PT Assessment - 05/19/21 0001       Berg Balance Test   Sit to Stand Able to stand without using hands and stabilize independently    Standing Unsupported Able to stand safely 2 minutes    Sitting with Back Unsupported but Feet Supported on Floor or Stool Able to sit safely and securely 2 minutes    Stand to Sit Sits safely with minimal use of hands    Transfers Able to transfer safely, minor use of hands    Standing Unsupported with Eyes Closed  Able to stand 10 seconds with supervision    Standing Unsupported with Feet Together Able to place feet together independently and stand for 1 minute with supervision    From Standing, Reach Forward with Outstretched Arm Can reach confidently >25 cm (10")    From Standing Position, Pick up Object from Floor Unable to try/needs assist to keep balance    From Standing Position, Turn to Look Behind Over each Shoulder Looks behind from both sides and weight shifts well    Turn 360 Degrees Able to turn 360 degrees safely but slowly   6.63, 6.79   Standing Unsupported, Alternately Place Feet on Step/Stool Able to complete 4 steps without aid or supervision    Standing Unsupported, One Foot in Front Able to plae foot ahead of the other independently and hold 30 seconds    Standing on One Leg Tries to lift leg/unable to hold 3 seconds but remains standing independently    Total Score 42             TUG score 19.03 secs without  device                [facility] Adult PT Treatment/Exercise - 05/19/21 0001       Transfers   Transfers Sit to Stand;Stand to Sit    Sit to Stand 5: Supervision    Stand to Sit 5: Supervision      Ambulation/Gait   Ambulation/Gait Yes    Ambulation/Gait Assistance 4: Min guard    Ambulation Distance (Feet) 115 Feet    Assistive device None    Gait Pattern Step-through pattern    Ambulation Surface Level;Indoor    Gait velocity 14.78 secs = 2.22 ft/sec without device      Standardized Balance Assessment   Standardized Balance Assessment Berg Balance Test                     PT Education - 05/19/21 1029     Education Details reviewed LTG's and progress -plan to continue for 14 more visits as addiitonal auth received from [employer]) Educated Patient    Comprehension Verbalized understanding              PT Short Term Goals - 05/18/21 1414       PT SHORT TERM GOAL #1   Title Improve Berg score from 42/56 to >/= 45/56 for reduced fall risk.    Baseline 03/21/21: 48/56 scored today;  05-18-21 42/56    Time 4    Period Weeks    Status New    Target Date 06/16/21      PT SHORT TERM GOAL #2   Title Improve TUG score from 19.03 secs to </= 16.5 secs with no device for decr. fall risk.    Baseline 12.00 secs without device on 03-14-21; 19 secs on 05-18-21    Time 4    Period Weeks    Status New    Target Date 06/16/21      PT SHORT TERM GOAL #3   Title Incr. gait velocity from 2.22 ft/sec to >/= 2.6 ft/sec for incr. gait efficiency (without device).    Baseline 10.81 secs = 3.03 ft/sec without device - 03-14-21; 14.78 secs = 2.22 ft/sec on 05-18-21    Time 4    Period Weeks    Status New    Target Date 06/16/21      PT SHORT TERM GOAL #4   Title Amb. 350'  nonstop without device with SBA on flat, even surface to demo improved endurance.    Baseline 115' on 05-18-21    Time 4    Period Weeks    Status New    Target Date 06/16/21       PT SHORT TERM GOAL #5   Title --    Baseline --    Status --               PT Long Term Goals - 05/18/21 1414       PT LONG TERM GOAL #1   Title Increase Berg score to >/= 48/56 for reduced fall risk;    Baseline 33/56 on 02-13-21; score 42/56 on 05-18-21    Time 8    Period Weeks    Status New    Target Date 07/14/21      PT LONG TERM GOAL #2   Title Improve TUG score from 19.03 secs to </= 15 secs without device to reduce fall risk.    Baseline 28.12 secs without device;  19.03 secs with bil. UE support on 05-18-21    Time 8    Period Weeks    Status New    Target Date 07/14/21      PT LONG TERM GOAL #3   Title Pt will amb. 500' with SPC on even and uneven surfaces with supervision for incr. community accessibility.    Baseline 115' with no device at end of session - stopped due to fatigue - 05-18-21    Time 8    Period Weeks    Status On-going    Target Date 07/14/21      PT LONG TERM GOAL #4   Title Perform sit to stand from mat table without UE support to demo improved LE strength. UPDATED - perform sit to stand 10 times without UE support without LOB upon initial standing to demo improved LE strength.    Baseline met 05-18-21    Time 8    Period Weeks    Status New    Target Date 07/14/21      PT LONG TERM GOAL #5   Title Independent in updated HEP for balance and strengthening.    Baseline met 05-18-21 but pt is not compliant with HEP at home    Time 8    Period Weeks    Status On-going    Target Date 07/14/21      Additional Long Term Goals   Additional Long Term Goals Yes      PT LONG TERM GOAL #6   Title Increase Foto score from 53/100 to >/= 60/100 to demo improvement in functional status.    Baseline 53/100 on 02-13-21    Time 8    Period Weeks    Status On-going    Target Date 07/14/21      PT LONG TERM GOAL #7   Title Negotiate 4 steps with 1 hand rail using a step over step sequence.    Time 8    Period Weeks    Status New    Target  Date 07/14/21                   Plan - 05/19/21 1050     Clinical Impression Statement Session focused on assessment of LTG's as additional authorization for 15 visits received from the [place].  Pt met LTG's #2 and 4 with TUG score improving to 19.03 secs and pt able to perform sit to stand without  UE support from the mat.  LTG #1 not met as his Berg score has decreased from 48/56 to 42/56.  LTG # 3 not met as pt unable to amb. 500' due to fatigue.  Pt is not using an assistive device (cane has been recommended but pt declines to use); pt amb. 115' prior to fatigue on 05-18-21.  LTG #5 is ongoing as pt is not compliant with HEP at this time.  Pt will benefit from continued PT to address balance and gait deficits.    Personal Factors and Comorbidities Age;Fitness;Comorbidity 2;Transportation;Past/Current Experience    Comorbidities h/o cervical fusion C5-7 (2012), h/o neck injury 2016, h/o prior CVA in March 2020, DM type II, HTN, stage 3 CKD    Examination-Activity Limitations Locomotion Level;Transfers;Bend;Stairs;Stand;Lift;Squat    Examination-Participation Restrictions Church;Cleaning;Meal Prep;Driving;Community Activity;Interpersonal Relationship;Laundry;Shop;Yard Work    [employer]chant [employer] officer Evolving/Moderate complexity    Rehab Potential Good    PT Frequency 2x / week    PT Duration 8 weeks    PT Treatment/Interventions ADLs/Self Care Home Management;Aquatic Therapy;Gait training;Stair training;Therapeutic activities;Therapeutic exercise;Balance training;DME Instruction;Neuromuscular re-education;Patient/family education    PT Next Visit Plan [facility] APPTs  ; cont balance and gait training - without device in clinic.  Continue aquatic training: gait and balance, tandem gait, activities with EC    PT Home Exercise Plan balance and sit to stand    Consulted and Agree with Plan of Care Patient;Family member/caregiver    Family Member Consulted Jeff [name]             Patient  will benefit from skilled therapeutic intervention in order to improve the following deficits and impairments:  Decreased balance, Decreased endurance, Decreased strength, Difficulty walking, Pain, Postural dysfunction  Visit Diagnosis: Other abnormalities of gait and mobility - Plan: PT plan of care cert/re-cert  Unsteadiness on feet - Plan: PT plan of care cert/re-cert  Muscle weakness (generalized) - Plan: PT plan of care cert/re-cert  Hemiplegia and hemiparesis following cerebral infarction affecting right dominant side ([place]) - Plan: PT plan of care cert/re-cert     Problem List Patient Active Problem List   Diagnosis Date Noted   TIA (transient ischemic attack) 03/10/2020   CKD (chronic kidney disease) 08/28/2018   Retinopathy of both eyes 08/28/2018   Stroke due to embolism of left vertebral artery ([place]) 08/12/2018   Stroke ([place]) 08/08/2018   Diabetes mellitus ([place]) 07/25/2015   Neck injury 06/22/2015   Hearing loss in right ear 10/14/2011   Agent orange exposure 10/14/2011   ADD (attention deficit disorder) 10/14/2011   Hyperlipidemia 10/14/2011   BMI 28.0-28.9,adult 10/14/2011    Dane Kopke, [place], PT 05/19/2021, 11:11 AM  [facility] [address] [place], [place], [address] Phone: [phone]   Fax:  [phone]  Name: JOSYAH ACHOR MRN: [id number] Date of Birth: [birth year]

## 2021-05-22 ENCOUNTER — Encounter: Payer: Self-pay | Admitting: Physical Therapy

## 2021-05-22 ENCOUNTER — Ambulatory Visit: Payer: No Typology Code available for payment source | Admitting: Physical Therapy

## 2021-05-22 ENCOUNTER — Other Ambulatory Visit: Payer: Self-pay

## 2021-05-22 DIAGNOSIS — I69351 Hemiplegia and hemiparesis following cerebral infarction affecting right dominant side: Secondary | ICD-10-CM

## 2021-05-22 DIAGNOSIS — M6281 Muscle weakness (generalized): Secondary | ICD-10-CM

## 2021-05-22 DIAGNOSIS — R2681 Unsteadiness on feet: Secondary | ICD-10-CM

## 2021-05-22 DIAGNOSIS — R293 Abnormal posture: Secondary | ICD-10-CM

## 2021-05-22 DIAGNOSIS — R2689 Other abnormalities of gait and mobility: Secondary | ICD-10-CM | POA: Diagnosis not present

## 2021-05-22 NOTE — Therapy (Signed)
[facility] [address] [place], [place], [address] Phone: [phone]   Fax:  [phone]  Physical Therapy Treatment  Patient Details  Name: Jeff [name] MRN: [id number] Date of Birth: Feb 10, 1951 Referring Provider (PT): Eulogio Bear, DO   Encounter Date: 05/22/2021   PT End of Session - 05/22/21 1747     Visit Number 2    Number of Visits 27   15 visits authorized by [facility]   Date for PT Re-Evaluation 07/14/21    Authorization Type [facility] - 15 visits    Authorization Time Period additional auth 05-10-21 - 09-07-21    Authorization - Visit Number 2    Authorization - Number of Visits 15    PT Start Time 1100    PT Stop Time 1143    PT Time Calculation (min) 43 min    Equipment Utilized During Treatment --   pool step, aquatic ankle weights   Activity Tolerance Patient tolerated treatment well    Behavior During Therapy WFL for tasks assessed/performed             Past Medical History:  Diagnosis Date   ADHD (attention deficit hyperactivity disorder)    Diabetes mellitus    Hyperlipidemia    Hypertension    Renal disorder    Stroke [facility])     Past Surgical History:  Procedure Laterality Date   IR ANGIO INTRA EXTRACRAN SEL COM CAROTID INNOMINATE BILAT MOD SED  01/03/2021   IR ANGIO VERTEBRAL SEL SUBCLAVIAN INNOMINATE UNI L MOD SED  01/03/2021   IR ANGIO VERTEBRAL SEL VERTEBRAL UNI R MOD SED  01/03/2021   IR RADIOLOGIST EVAL & MGMT  01/13/2021   IR US GUIDE VASC ACCESS RIGHT  01/03/2021   NECK SURGERY     SPINE SURGERY     Cervical spine x 2; Hardin Negus; [facility].    There were no vitals filed for this visit.   Subjective Assessment - 05/22/21 1746     Subjective No new complaints. No falls or pain to report. Pt reports ready to resume aquatic therapy.    Patient is accompained by: Family member   spouse, [name]   Pertinent History h/o prior CVA (08-08-18):  h/o cervical fusion C5-7 (2012): h/o neck injury 2016: DM type  II, HTN,  stage 3 CKD    Limitations Walking;House hold activities;Lifting    Diagnostic tests MRI and MRA on 01-01-21    Patient Stated Goals Improve balance; walk without falling    Currently in Pain? No/denies    Pain Score 0-No pain             Aquatic therapy at [facility] - pool temp 96 degrees   Patient seen for aquatic therapy today.  Treatment took place in water 3.5-4.5 feet deep depending upon activity.  Pt entered/exited the pool using stairs with bil rails and step to pattern.   At bench in water: Long sitting on bench with one UE support at wall bil passive heel cord stretching for 30 seconds x 3 reps each. Seated at edge of bench with bil feet on aquatic step: - Bil hamstring stretching for 30 sec's x 3 reps each side with assist to keep knee straight on side of stretch and foot stationary on bend on other side. - Sit<>stands with no UE support: feet in parallel position for 10 reps, then feet in staggered position for 10 reps each foot forward. Min guard to min assist for balance upon standing. - With  2.5 ankle weights to bil LE's: long arc quads for 2 sets of 10 reps, alternating marching for 2 sets of 10 reps. Then had pt perform a flow movement consisting of bringing foot up for knee extension<>into hip abduction in available hip range<>back into hip adduction to neutral<>knee flexion to bring foot back onto step for 10 reps each side. Cues to keep toes/foot flexed with hip movements.   In water level of ~4.0 feet with no AD, min guard assist to occasional min assist for balance Forward walking with emphasis on heel>toe step progression and posture for 4 laps across pool/back Backward walking across pool and back for 4 laps with emphasis on tall posture and step length with gait Side stepping left<>right across the pool for 4 laps each way with cues on posture and to keep feet facing forward.    Pt requires buoyancy of water for support for reduced fall risk with gait  training and balance exercises with minimal UE support; exercises able to be performed safely in water without the risk of fall compared to those same exercises performed on land;  viscosity of water needed for resistance for strengthening.  Current of water provides perturbations for challenging static & dynamic standing balance.                PT Short Term Goals - 05/18/21 1414       PT SHORT TERM GOAL #1   Title Improve Berg score from 42/56 to >/= 45/56 for reduced fall risk.    Baseline 03/21/21: 48/56 scored today;  05-18-21 42/56    Time 4    Period Weeks    Status New    Target Date 06/16/21      PT SHORT TERM GOAL #2   Title Improve TUG score from 19.03 secs to </= 16.5 secs with no device for decr. fall risk.    Baseline 12.00 secs without device on 03-14-21; 19 secs on 05-18-21    Time 4    Period Weeks    Status New    Target Date 06/16/21      PT SHORT TERM GOAL #3   Title Incr. gait velocity from 2.22 ft/sec to >/= 2.6 ft/sec for incr. gait efficiency (without device).    Baseline 10.81 secs = 3.03 ft/sec without device - 03-14-21; 14.78 secs = 2.22 ft/sec on 05-18-21    Time 4    Period Weeks    Status New    Target Date 06/16/21      PT SHORT TERM GOAL #4   Title Amb. 350' nonstop without device with SBA on flat, even surface to demo improved endurance.    Baseline 115' on 05-18-21    Time 4    Period Weeks    Status New    Target Date 06/16/21      PT SHORT TERM GOAL #5   Title --    Baseline --    Status --               PT Long Term Goals - 05/18/21 1414       PT LONG TERM GOAL #1   Title Increase Berg score to >/= 48/56 for reduced fall risk;    Baseline 33/56 on 02-13-21; score 42/56 on 05-18-21    Time 8    Period Weeks    Status New    Target Date 07/14/21      PT LONG TERM GOAL #2   Title Improve TUG score  from 19.03 secs to </= 15 secs without device to reduce fall risk.    Baseline 28.12 secs without device;  19.03 secs  with bil. UE support on 05-18-21    Time 8    Period Weeks    Status New    Target Date 07/14/21      PT LONG TERM GOAL #3   Title Pt will amb. 500' with SPC on even and uneven surfaces with supervision for incr. community accessibility.    Baseline 115' with no device at end of session - stopped due to fatigue - 05-18-21    Time 8    Period Weeks    Status On-going    Target Date 07/14/21      PT LONG TERM GOAL #4   Title Perform sit to stand from mat table without UE support to demo improved LE strength. UPDATED - perform sit to stand 10 times without UE support without LOB upon initial standing to demo improved LE strength.    Baseline met 05-18-21    Time 8    Period Weeks    Status New    Target Date 07/14/21      PT LONG TERM GOAL #5   Title Independent in updated HEP for balance and strengthening.    Baseline met 05-18-21 but pt is not compliant with HEP at home    Time 8    Period Weeks    Status On-going    Target Date 07/14/21      Additional Long Term Goals   Additional Long Term Goals Yes      PT LONG TERM GOAL #6   Title Increase Foto score from 53/100 to >/= 60/100 to demo improvement in functional status.    Baseline 53/100 on 02-13-21    Time 8    Period Weeks    Status On-going    Target Date 07/14/21      PT LONG TERM GOAL #7   Title Negotiate 4 steps with 1 hand rail using a step over step sequence.    Time 8    Period Weeks    Status New    Target Date 07/14/21                   Plan - 05/22/21 1748     Clinical Impression Statement Today's skilled session continued to focus on stretching, strengthening and gait/balance in the aquatic setting. Occasional rest breaks needed due to fatigue, otherwise no issues noted or reported by pt in session. Pt's glucose levels remainded WNL's with session. The pt is making progress toward goals and should benefit from continued PT to progress toward unmet goals.    Personal Factors and Comorbidities  Age;Fitness;Comorbidity 2;Transportation;Past/Current Experience    Comorbidities h/o cervical fusion C5-7 (2012), h/o neck injury 2016, h/o prior CVA in March 2020, DM type II, HTN, stage 3 CKD    Examination-Activity Limitations Locomotion Level;Transfers;Bend;Stairs;Stand;Lift;Squat    Examination-Participation Restrictions Church;Cleaning;Meal Prep;Driving;Community Activity;Interpersonal Relationship;Laundry;Shop;Yard Work    [employer]chant [employer] officer Evolving/Moderate complexity    Rehab Potential Good    PT Frequency 2x / week    PT Duration 8 weeks    PT Treatment/Interventions ADLs/Self Care Home Management;Aquatic Therapy;Gait training;Stair training;Therapeutic activities;Therapeutic exercise;Balance training;DME Instruction;Neuromuscular re-education;Patient/family education    PT Next Visit Plan LAND APPTs  ; cont balance and gait training - without device in clinic.  Continue aquatic training: gait and balance, tandem gait, activities with EC    PT Home Exercise Plan  balance and sit to stand    Consulted and Agree with Plan of Care Patient;Family member/caregiver    Family Member Consulted [name]             Patient will benefit from skilled therapeutic intervention in order to improve the following deficits and impairments:  Decreased balance, Decreased endurance, Decreased strength, Difficulty walking, Pain, Postural dysfunction  Visit Diagnosis: Other abnormalities of gait and mobility  Unsteadiness on feet  Muscle weakness (generalized)  Hemiplegia and hemiparesis following cerebral infarction affecting right dominant side (HCC)  Abnormal posture     Problem List Patient Active Problem List   Diagnosis Date Noted   TIA (transient ischemic attack) 03/10/2020   CKD (chronic kidney disease) 08/28/2018   Retinopathy of both eyes 08/28/2018   Stroke due to embolism of left vertebral artery ([place]) 08/12/2018   Stroke ([place]) 08/08/2018   Diabetes mellitus  ([place]) 07/25/2015   Neck injury 06/22/2015   Hearing loss in right ear 10/14/2011   Agent orange exposure 10/14/2011   ADD (attention deficit disorder) 10/14/2011   Hyperlipidemia 10/14/2011   BMI 28.0-28.9,adult 10/14/2011    Willow Ora, PTA, [facility] [address], [place], [place] [address] [phone] 05/22/21, 7:07 PM   Name: Jeff [name] MRN: [id number] Date of Birth: Oct 27, 1950

## 2021-06-07 ENCOUNTER — Other Ambulatory Visit: Payer: Self-pay

## 2021-06-07 ENCOUNTER — Ambulatory Visit (INDEPENDENT_AMBULATORY_CARE_PROVIDER_SITE_OTHER): Payer: Medicare PPO | Admitting: Adult Health

## 2021-06-07 ENCOUNTER — Encounter: Payer: Self-pay | Admitting: Physical Therapy

## 2021-06-07 ENCOUNTER — Encounter: Payer: Self-pay | Admitting: Adult Health

## 2021-06-07 ENCOUNTER — Ambulatory Visit: Payer: No Typology Code available for payment source | Attending: Internal Medicine | Admitting: Physical Therapy

## 2021-06-07 VITALS — BP 128/82 | HR 70 | Ht 68.0 in | Wt 198.0 lb

## 2021-06-07 DIAGNOSIS — R293 Abnormal posture: Secondary | ICD-10-CM | POA: Diagnosis present

## 2021-06-07 DIAGNOSIS — R2689 Other abnormalities of gait and mobility: Secondary | ICD-10-CM | POA: Insufficient documentation

## 2021-06-07 DIAGNOSIS — I63532 Cerebral infarction due to unspecified occlusion or stenosis of left posterior cerebral artery: Secondary | ICD-10-CM

## 2021-06-07 DIAGNOSIS — M6281 Muscle weakness (generalized): Secondary | ICD-10-CM | POA: Insufficient documentation

## 2021-06-07 DIAGNOSIS — R519 Headache, unspecified: Secondary | ICD-10-CM | POA: Diagnosis not present

## 2021-06-07 DIAGNOSIS — H5712 Ocular pain, left eye: Secondary | ICD-10-CM | POA: Diagnosis not present

## 2021-06-07 DIAGNOSIS — R2681 Unsteadiness on feet: Secondary | ICD-10-CM | POA: Diagnosis present

## 2021-06-07 DIAGNOSIS — I69351 Hemiplegia and hemiparesis following cerebral infarction affecting right dominant side: Secondary | ICD-10-CM | POA: Insufficient documentation

## 2021-06-07 DIAGNOSIS — G459 Transient cerebral ischemic attack, unspecified: Secondary | ICD-10-CM

## 2021-06-07 NOTE — Patient Instructions (Signed)
Access Code: [email or web address] URL: [email or web address] Date: 06/07/2021 Prepared by: Willow Ora  Exercises Standing March with Counter Support - 1 x daily - 5 x weekly - 1 sets - 10 reps Standing Hip Abduction with Counter Support - 1 x daily - 5 x weekly - 1 sets - 10 reps Standing Hip Extension with Counter Support - 1 x daily - 5 x weekly - 1 sets - 10 reps Standing Hip Flexion with Counter Support - 1 x daily - 5 x weekly - 1 sets - 10 reps Standing Single Leg Stance with Counter Support - 1 x daily - 5 x weekly - 1 sets - 3 reps - 20 hold

## 2021-06-07 NOTE — Progress Notes (Signed)
[facility] [address] [place]. [place] [address] [phone]       OFFICE FOLLOW-UP NOTE  [name] Date of Birth:  01/11/1951 Medical Record Number:  [id number]   Primary neurologist: Dr. Leonie Man   Chief Complaint  Patient presents with   Follow-up    Rm  3 with wife has noticed an increase in h/a since last visit. Reports he will have a sharp pain behind his left eye, this can happen up to 2-3 time per day.   HISTORY:  Jeff [name] is a 71 y.o. male with hx of left cerebellum DWI negative stroke on 08/09/2018 due to left VA occlusion with residual imbalance/gait impairment and right-sided ataxia, TIA 12/2020, transient AMS 08/2019 possibly in setting of hypoglycemia, OSA, HTN, HLD and DM.    HPI:   Update 06/07/2020 Jeff [name] is here today for a stroke follow-up accompanied by his wife.  Denies new or reoccurring stroke/TIA symptoms. Does report left orbital headache 2-3x per day present past 2 months - quick sharp pain - only lasts for a few seconds. Will have occasional dull headache in same area relieved by Tylenol. No visual changes.  Denies photophobia, phonophobia or N/V. Does follow with Dr. Katy Fitch and Dr. Rodena Piety (retina) - has f/u with Dr. Zigmund Daniel 1/27.  No prior history of headaches or migraines.  Recently established care with endocrinologist Dr. Chalmers Cater with improvement of glucose levels.  Prior A1c 7.8 (02/2021) - believes more recent level completed but unknown level -unable to view via epic. Remains on Aspirin and Crestor without side effects. More recent lipid panel 01/2021 LDL 41. BP today 128/82.  Does not routinely monitor at home.  No further concerns at this time   UPDATE 03/07/2021 Jeff [name] is a pleasant 71 year old Caucasian male seen today for initial office follow-up visit following hospital consultation for TIA.  History is obtained from the patient and and wife and have personally reviewed electronic medical records as well as pertinent  available imaging films in PACS. Jeff [name] has past medical history of diabetes, pretension, hyperlipidemia, obstructive sleep apnea and prior stroke with some residual right-sided weakness and gait difficulty who presented on 01/01/2021 with sudden onset of word finding difficulties, slurred speech and fall due to bilateral right greater than left leg weakness.  He was fine when he went to bed the night prior but when he woke up he could not remember the exact time but noticed that his legs were weak and he had a fall while trying to get up and when he tried to speak his speech was not right.  His speech was slurred and he had trouble getting the words out.  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  He had a prior history of a stroke in March 2020 which was a punctate left medullary infarct for which she was treated with tPA with modest improvement but with some residual right-sided deficits and gait imbalance.  MRI scan of the brain showed no acute abnormality and showed chronic lacunar infarcts in the right basal ganglia carotid ultrasound showed only 1-39% bilateral carotid stenosis.  2D echo showed ejection fraction of 60 to 65% without definite clot.  LDL cholesterol  41 mg percent and hemoglobin A1c was elevated 8.6.  Patient also had diagnosis of a catheter angiogram on 01/03/2021 which showed only moderate 50% bilateral cavernous carotid stenosis and 50 to 60% proximal right PCA stenosis.  Left vertebral artery was occluded in the distal segment.  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  There was felt to be high-grade stenosis of the superior division of the left middle cerebral artery but this was difficult to  appreciate on the corresponding CT angio and MR angiogram images and states it was felt that patient should be treated with maximal medical therapy and was discharged on aspirin and Brilinta for 3 months..  He states he is doing well is tolerating both medications without significant bruising or bleeding.  Blood pressures under good control today it is 109/72.  Is tolerating Crestor well without muscle aches and pains.  His sugars however fluctuate quite a bit.  He has an appointment to see endocrinologist to discuss better diabetes control.  Patient does have history of sleep apnea and snores a lot.  However he has tried 4 different CPAP mask and has not been able to tolerate them.  He sees a neurologist in the [place] system and plans to discuss alternative treatment options at next visit.  He is currently doing outpatient physical occupational therapy.  He still has mild paresthesias in his feet from diabetic neuropathy.  He is also getting some outpatient occupational therapy for right frozen shoulder at the [place].  He has no new complaints.    ROS:   14 system review of systems is positive for those listed in HPI and all other systems negative  PMH:  Past Medical History:  Diagnosis Date   ADHD (attention deficit hyperactivity disorder)    Diabetes mellitus    Hyperlipidemia    Hypertension    Renal disorder    Stroke [facility])     Social History:  Social History   Socioeconomic History   Marital status: Married    Spouse name: Not on file   Number of children: Not on file   Years of education: Not on file   Highest education level: Not on file  Occupational History   Occupation: Press photographer  Tobacco Use   Smoking status: Never   Smokeless tobacco: Never  Vaping Use   Vaping Use: Never used  Substance and Sexual Activity   Alcohol use: No    Alcohol/week: 0.0 standard drinks   Drug use: No   Sexual activity: Yes  Other Topics Concern   Not on file  Social History  Narrative   Not on file   Social Determinants of Health   Financial Resource Strain: Not on file  Food Insecurity: Not on file  Transportation Needs: Not on file  Physical Activity: Not on file  Stress: Not on file  Social Connections: Not on file  Intimate Partner Violence: Not on file    Medications:   Current Outpatient Medications on File Prior to Visit  Medication Sig Dispense Refill   acetaminophen (TYLENOL) 500 MG tablet Take 500-1,000 mg by mouth every 6 (six) hours as needed for headache.     aspirin EC 81 MG EC tablet Take 1 tablet (81 mg total) by mouth Jeff. Swallow whole. 30 tablet 11   Cholecalciferol (VITAMIN D) 50 MCG (2000 UT) tablet Take 2,000 Units by mouth Jeff.     empagliflozin (JARDIANCE) 25 MG TABS tablet TAKE ONE-HALF TABLET BY MOUTH EVERY MORNING FOR DIABETES;  THIS REPLACES METFORMIN XR.     Glucagon 1 MG/0.2ML SOAJ Inject 1 mg into the skin as needed (low blood sugar).     glucose blood (ONETOUCH VERIO) test strip 1 each by Other route 4 (four) times Jeff -  before meals and at bedtime. dexcom 6     insulin aspart (NOVOLOG FLEXPEN) 100 UNIT/ML FlexPen 23 Units 3 (three) times Jeff as needed for high blood sugar. Sliding scale per endocrinologist     insulin glargine (LANTUS) 100 UNIT/ML injection Inject 0.45-0.55 mLs (45-55 Units total) into the skin 2 (two) times Jeff. Inject 45 units in the morning and 55 units at bedtime (Patient taking differently: Inject into the skin 2 (two) times Jeff. Inject 55 units in the morning and 45 units at bedtime)     Insulin Syringe-Needle U-100 (B-D INS SYR ULTRAFINE 1CC/31G) 31G X 5/16" 1 ML MISC Used to inject insulin twice Jeff. 90 each 2   levothyroxine (SYNTHROID) 50 MCG tablet Take 50 mcg by mouth Jeff before breakfast.     lisinopril (ZESTRIL) 2.5 MG tablet Take 1 tablet (2.5 mg total) by mouth Jeff.     Magnesium Oxide 420 MG TABS Take 1 tablet by mouth in the morning and at bedtime.     Multiple  Vitamins-Minerals (ICAPS AREDS 2 PO) Take 1 capsule by mouth in the morning and at bedtime.     pantoprazole (PROTONIX) 40 MG tablet Take 1 tablet (40 mg total) by mouth Jeff.     rosuvastatin (CRESTOR) 40 MG tablet TAKE 1 TABLET BY MOUTH Jeff 90 tablet 0   No current facility-administered medications on file prior to visit.    Allergies:   Allergies  Allergen Reactions   Flexeril [Cyclobenzaprine Hcl] Other (See Comments)    Causes him to pass out and lowers his BP   Lidocaine Swelling    throat   Novocain [Procaine Hcl] Swelling    Physical Exam Today's Vitals   06/07/21 0852  BP: 128/82  Pulse: 70  SpO2: 96%  Weight: 198 lb (89.8 kg)  Height: '5\' 8"'  (1.727 m)   Body mass index is 30.11 kg/m.   General: well developed, well nourished very pleasant elderly Caucasian male, seated, in no evident distress Head: head normocephalic and atraumatic. Mild tenderness with palpation over left temple Neck: supple with no carotid or supraclavicular bruits Cardiovascular: regular rate and rhythm, no murmurs Musculoskeletal: no deformity Skin:  no rash/petichiae Vascular:  Normal pulses all extremities  Neurologic Exam Mental Status: Awake and fully alert.  Fluent speech and language.  Oriented to place and time. Recent and remote memory intact. Attention span, concentration and fund of knowledge appropriate. Mood and affect appropriate.  Cranial Nerves: Pupils equal, briskly reactive to light. Extraocular movements full without nystagmus. Visual fields full to confrontation. Hearing intact. Facial sensation intact. Face, tongue, palate moves normally and symmetrically.  Motor: Normal bulk and tone. Normal strength in all tested extremity muscles. Sensory.: intact to touch ,pinprick but slightly impaired.position and vibratory sensation over both feet bilaterally..  Romberg is mildly positive Coordination: Rapid alternating movements normal in all extremities. Finger-to-nose and  heel-to-shin mild right sided ataxia - chronic, stable. Gait and Station: Arises from chair without difficulty. Stance is normal. Gait demonstrates normal stride length and mild imbalance without use of AD.  Unable to perform heel, toe and tandem walk.  Reflexes: 1+ and symmetric. Toes downgoing.       ASSESSMENT/PLAN: Jeff [name] is a 71 y.o. year old male  with left cerebellum DWI negative infarct s/p TPA on 08/09/2018 secondary to left VA occlusion versus arthrosclerosis given risk factors and ICA siphon atherosclerosis and residual right-sided ataxia and gait impairment, and left hemispheric TIA 12/2020.  Vascular risk factors include HTN, HLD, intracranial stenosis and DM. C/o 2 mo onset of left orbital pain    Left upper outer orbital pain Quick sharp sensation 2-3x/day, no visual changes, mild tenderness with left temporal palpation Obtain CRP and ESR to rule out GCA although lower suspicion No red flag symptoms or concerning exam findings Recommend scheduling f/u visit with established ophthalmologist Dr. Katy Fitch - if unremarkable, will proceed with further evaluation with imaging if indicated (based on age, hx of stroke and no prior hx of headaches/migraines) but patient wishes to hold off at this time Discussed warning signs and to proceed to 911 immediately if any should occur or headaches worsen  Hx of stroke, TIA Continue aspirin and Crestor for secondary stroke prevention Ensure close PCP follow-up for aggressive stroke risk factor management including HTN with BP goal<130/90 and HLD with LDL goal<70 and close follow-up with endocrinology for DM management with A1c goal<7.0    CC:  [facility] provider: Dr. Bedelia Person, MD   I spent 38 minutes of face-to-face and non-face-to-face time with patient and wife.  This included previsit chart review, lab review, study review, order entry, electronic health record documentation, patient and wife education and discussion regarding  new onset left eye symptoms as noted above, history of stroke and TIA with residual deficits, secondary stroke prevention measures and aggressive stroke risk factor management and answered all other questions to patient and wife satisfaction   Jeff [name], AGNP-BC  [facility] [address] [place], [place] [address]  Phone [phone] Fax [phone] Note: This document was prepared with digital dictation and possible smart phrase technology. Any transcriptional errors that result from this process are unintentional.

## 2021-06-07 NOTE — Patient Instructions (Signed)
Please follow up with your eye doctor to further evaluate your recent onset headaches - if they believe this is more of a neurological/brain issue, let us know and we can discuss pursing imaging. If these worsen or accompanied by blurred/loss of vision or temporal pain, please proceed to ED immediately   Continue aspirin 81 mg daily  and Crestor  for secondary stroke prevention  Continue to follow up with PCP/endocrinology regarding cholesterol, blood pressure and diabetes management  Maintain strict control of hypertension with blood pressure goal below 130/90, diabetes with hemoglobin A1c goal below 7.0 % and cholesterol with LDL cholesterol (bad cholesterol) goal below 70 mg/dL.   Signs of a Stroke? Follow the BEFAST method:  Balance Watch for a sudden loss of balance, trouble with coordination or vertigo Eyes Is there a sudden loss of vision in one or both eyes? Or double vision?  Face: Ask the person to smile. Does one side of the face droop or is it numb?  Arms: Ask the person to raise both arms. Does one arm drift downward? Is there weakness or numbness of a leg? Speech: Ask the person to repeat a simple phrase. Does the speech sound slurred/strange? Is the person confused ? Time: If you observe any of these signs, call 911.     Followup in the future with me in 6 months or call earlier if needed       Thank you for coming to see [place] at [facility]. I hope we have been able to provide you high quality care today.  You may receive a patient satisfaction survey over the next few weeks. We would appreciate your feedback and comments so that we may continue to improve ourselves and the health of our patients.

## 2021-06-07 NOTE — Therapy (Signed)
[facility] [address] [place], [place], [address] Phone: [phone]   Fax:  [phone]  Physical Therapy Treatment  Patient Details  Name: Jeff [name] MRN: [id number] Date of Birth: 08/28/1950 Referring Provider (PT): Eulogio Bear, DO   Encounter Date: 06/07/2021   PT End of Session - 06/07/21 0940     Visit Number 15    Number of Visits 27   15 visits authorized by [facility]   Date for PT Re-Evaluation 07/14/21    Authorization Type VA - 15 visits    Authorization Time Period additional auth 05-10-21 - 09-07-21    Authorization - Visit Number 3    Authorization - Number of Visits 15    PT Start Time 9242   running late from MD office next door   PT Stop Time 1015    PT Time Calculation (min) 39 min    Equipment Utilized During Treatment Gait belt    Activity Tolerance Patient tolerated treatment well    Behavior During Therapy WFL for tasks assessed/performed             Past Medical History:  Diagnosis Date   ADHD (attention deficit hyperactivity disorder)    Diabetes mellitus    Hyperlipidemia    Hypertension    Renal disorder    Stroke [facility])     Past Surgical History:  Procedure Laterality Date   IR ANGIO INTRA EXTRACRAN SEL COM CAROTID INNOMINATE BILAT MOD SED  01/03/2021   IR ANGIO VERTEBRAL SEL SUBCLAVIAN INNOMINATE UNI L MOD SED  01/03/2021   IR ANGIO VERTEBRAL SEL VERTEBRAL UNI R MOD SED  01/03/2021   IR RADIOLOGIST EVAL & MGMT  01/13/2021   IR US GUIDE VASC ACCESS RIGHT  01/03/2021   NECK SURGERY     SPINE SURGERY     Cervical spine x 2; Hardin Negus; Critzer.    There were no vitals filed for this visit.   Subjective Assessment - 06/07/21 0939     Subjective No new complaitns. No falls or pain to report. Was a little tired after aquatic session, "not too much".    Patient is accompained by: Family member   spouse   Pertinent History h/o prior CVA (08-08-18):  h/o cervical fusion C5-7 (2012): h/o neck  injury 2016: DM type II, HTN,  stage 3 CKD    Limitations Walking;House hold activities;Lifting    Diagnostic tests MRI and MRA on 01-01-21    Patient Stated Goals Improve balance; walk without falling    Currently in Pain? No/denies    Pain Score 0-No pain                   [facility] Adult PT Treatment/Exercise - 06/07/21 0941       Transfers   Transfers Sit to Stand;Stand to Sit    Sit to Stand 5: Supervision    Stand to Sit 5: Supervision      Ambulation/Gait   Ambulation/Gait Yes    Ambulation/Gait Assistance 4: Min guard    Ambulation Distance (Feet) --   around clinic with session   Assistive device None    Gait Pattern Step-through pattern    Ambulation Surface Level;Indoor      Exercises   Exercises Other Exercises    Other Exercises  reviewed and re-issued HEP for strengthening and balance. no issues reported with performance in session. Refer to [place] program for full details.      Knee/Hip Exercises: Aerobic  Nustep UE/LE's level 4.0 x 8 minutes with goal 50-60 steps per minute for strengthening and activity tolerance.                 Balance Exercises - 06/07/21 1004       Balance Exercises: Standing   Standing Eyes Opened Narrow base of support (BOS);Head turns;Solid surface;Other reps (comment);Limitations    Standing Eyes Opened Limitations on floor with feet together: head movements left<>right, then up<>down for ~10 reps each with min guard to min assist for balance.    Standing Eyes Closed Wide (BOA);Solid surface;3 reps;30 secs;Limitations    Standing Eyes Closed Limitations on floor with feet hip width apart: 30 sec's x 3 reps with min assist for balance. pt with posterior bias. cues on weight shifting to assist with balance recovery.            issued to HEP this session:  Access Code: [email or web address] URL: [email or web address] Date: 06/07/2021 Prepared by: Willow Ora  Exercises Standing March with Counter Support - 1 x  daily - 5 x weekly - 1 sets - 10 reps Standing Hip Abduction with Counter Support - 1 x daily - 5 x weekly - 1 sets - 10 reps Standing Hip Extension with Counter Support - 1 x daily - 5 x weekly - 1 sets - 10 reps Standing Hip Flexion with Counter Support - 1 x daily - 5 x weekly - 1 sets - 10 reps Standing Single Leg Stance with Counter Support - 1 x daily - 5 x weekly - 1 sets - 3 reps - 20 hold     PT Education - 06/07/21 1139     Education Details re-issued HEP after review/performance in session    Person(s) Educated Patient;Spouse    Methods Explanation;Demonstration;Verbal cues;Handout    Comprehension Verbalized understanding;Returned demonstration;Verbal cues required;Need further instruction              PT Short Term Goals - 05/18/21 1414       PT SHORT TERM GOAL #1   Title Improve Berg score from 42/56 to >/= 45/56 for reduced fall risk.    Baseline 03/21/21: 48/56 scored today;  05-18-21 42/56    Time 4    Period Weeks    Status New    Target Date 06/16/21      PT SHORT TERM GOAL #2   Title Improve TUG score from 19.03 secs to </= 16.5 secs with no device for decr. fall risk.    Baseline 12.00 secs without device on 03-14-21; 19 secs on 05-18-21    Time 4    Period Weeks    Status New    Target Date 06/16/21      PT SHORT TERM GOAL #3   Title Incr. gait velocity from 2.22 ft/sec to >/= 2.6 ft/sec for incr. gait efficiency (without device).    Baseline 10.81 secs = 3.03 ft/sec without device - 03-14-21; 14.78 secs = 2.22 ft/sec on 05-18-21    Time 4    Period Weeks    Status New    Target Date 06/16/21      PT SHORT TERM GOAL #4   Title Amb. 350' nonstop without device with SBA on flat, even surface to demo improved endurance.    Baseline 115' on 05-18-21    Time 4    Period Weeks    Status New    Target Date 06/16/21      PT SHORT TERM GOAL #5   Title --  Baseline --    Status --               PT Long Term Goals - 05/18/21 1414        PT LONG TERM GOAL #1   Title Increase Berg score to >/= 48/56 for reduced fall risk;    Baseline 33/56 on 02-13-21; score 42/56 on 05-18-21    Time 8    Period Weeks    Status New    Target Date 07/14/21      PT LONG TERM GOAL #2   Title Improve TUG score from 19.03 secs to </= 15 secs without device to reduce fall risk.    Baseline 28.12 secs without device;  19.03 secs with bil. UE support on 05-18-21    Time 8    Period Weeks    Status New    Target Date 07/14/21      PT LONG TERM GOAL #3   Title Pt will amb. 500' with SPC on even and uneven surfaces with supervision for incr. community accessibility.    Baseline 115' with no device at end of session - stopped due to fatigue - 05-18-21    Time 8    Period Weeks    Status On-going    Target Date 07/14/21      PT LONG TERM GOAL #4   Title Perform sit to stand from mat table without UE support to demo improved LE strength. UPDATED - perform sit to stand 10 times without UE support without LOB upon initial standing to demo improved LE strength.    Baseline met 05-18-21    Time 8    Period Weeks    Status New    Target Date 07/14/21      PT LONG TERM GOAL #5   Title Independent in updated HEP for balance and strengthening.    Baseline met 05-18-21 but pt is not compliant with HEP at home    Time 8    Period Weeks    Status On-going    Target Date 07/14/21      Additional Long Term Goals   Additional Long Term Goals Yes      PT LONG TERM GOAL #6   Title Increase Foto score from 53/100 to >/= 60/100 to demo improvement in functional status.    Baseline 53/100 on 02-13-21    Time 8    Period Weeks    Status On-going    Target Date 07/14/21      PT LONG TERM GOAL #7   Title Negotiate 4 steps with 1 hand rail using a step over step sequence.    Time 8    Period Weeks    Status New    Target Date 07/14/21                   Plan - 06/07/21 0941     Clinical Impression Statement Today's skilled session  focused on strengthening and balance training. Reviewed and re-issued HEP as pt reports not doing them at home. No issues noted or reported in session today other than fatigue with rest breaks taken as needed. The pt is making progress toward goals and should benefit from continued PT to progress toward unmet goals.    Personal Factors and Comorbidities Age;Fitness;Comorbidity 2;Transportation;Past/Current Experience    Comorbidities h/o cervical fusion C5-7 (2012), h/o neck injury 2016, h/o prior CVA in March 2020, DM type II, HTN, stage 3 CKD    Examination-Activity Limitations  Locomotion Level;Transfers;Bend;Stairs;Stand;Lift;Squat    Examination-Participation Restrictions Church;Cleaning;Meal Prep;Driving;Community Activity;Interpersonal Relationship;Laundry;Shop;Yard Work    [employer]chant [employer] officer Evolving/Moderate complexity    Rehab Potential Good    PT Frequency 2x / week    PT Duration 8 weeks    PT Treatment/Interventions ADLs/Self Care Home Management;Aquatic Therapy;Gait training;Stair training;Therapeutic activities;Therapeutic exercise;Balance training;DME Instruction;Neuromuscular re-education;Patient/family education    PT Next Visit Plan [facility] APPTs  ; cont balance and gait training - without device in clinic.  Continue aquatic training: gait and balance, tandem gait, activities with EC    PT Home Exercise Plan balance and sit to stand    Consulted and Agree with Plan of Care Patient;Family member/caregiver    Family Member Consulted [name]             Patient will benefit from skilled therapeutic intervention in order to improve the following deficits and impairments:  Decreased balance, Decreased endurance, Decreased strength, Difficulty walking, Pain, Postural dysfunction  Visit Diagnosis: Other abnormalities of gait and mobility  Unsteadiness on feet  Muscle weakness (generalized)     Problem List Patient Active Problem List   Diagnosis Date Noted    TIA (transient ischemic attack) 03/10/2020   CKD (chronic kidney disease) 08/28/2018   Retinopathy of both eyes 08/28/2018   Stroke due to embolism of left vertebral artery ([place]) 08/12/2018   Stroke ([place]) 08/08/2018   Diabetes mellitus ([place]) 07/25/2015   Neck injury 06/22/2015   Hearing loss in right ear 10/14/2011   Agent orange exposure 10/14/2011   ADD (attention deficit disorder) 10/14/2011   Hyperlipidemia 10/14/2011   BMI 28.0-28.9,adult 10/14/2011    Willow Ora, PTA, [facility] [address] [place], [place] [address] [phone] 06/07/21, 11:40 AM   Name: JAISHAWN WITZKE MRN: [id number] Date of Birth: 11-15-1950

## 2021-06-08 LAB — SEDIMENTATION RATE: Sed Rate: 9 mm/hr (ref 0–30)

## 2021-06-08 LAB — C-REACTIVE PROTEIN: CRP: 3 mg/L (ref 0–10)

## 2021-06-12 ENCOUNTER — Ambulatory Visit: Payer: No Typology Code available for payment source | Admitting: Physical Therapy

## 2021-06-12 ENCOUNTER — Encounter: Payer: Self-pay | Admitting: Physical Therapy

## 2021-06-12 ENCOUNTER — Other Ambulatory Visit: Payer: Self-pay

## 2021-06-12 DIAGNOSIS — R2689 Other abnormalities of gait and mobility: Secondary | ICD-10-CM

## 2021-06-12 DIAGNOSIS — I69351 Hemiplegia and hemiparesis following cerebral infarction affecting right dominant side: Secondary | ICD-10-CM

## 2021-06-12 DIAGNOSIS — R2681 Unsteadiness on feet: Secondary | ICD-10-CM

## 2021-06-12 DIAGNOSIS — M6281 Muscle weakness (generalized): Secondary | ICD-10-CM

## 2021-06-12 NOTE — Therapy (Signed)
[facility] [address] [place], [place], [address] Phone: [phone]   Fax:  [phone]  Physical Therapy Treatment  Patient Details  Name: Jeff [name] MRN: [id number] Date of Birth: 1950-06-25 Referring Provider (PT): Eulogio Bear, DO   Encounter Date: 06/12/2021   PT End of Session - 06/12/21 2050     Visit Number 16    Number of Visits 27   15 visits authorized by [facility]   Date for PT Re-Evaluation 07/14/21    Authorization Type VA - 15 visits    Authorization Time Period additional auth 05-10-21 - 09-07-21    Authorization - Visit Number 4    Authorization - Number of Visits 15    PT Start Time 1100    PT Stop Time 4970    PT Time Calculation (min) 43 min    Equipment Utilized During Treatment Gait belt    Activity Tolerance Patient tolerated treatment well    Behavior During Therapy WFL for tasks assessed/performed             Past Medical History:  Diagnosis Date   ADHD (attention deficit hyperactivity disorder)    Diabetes mellitus    Hyperlipidemia    Hypertension    Renal disorder    Stroke [facility])     Past Surgical History:  Procedure Laterality Date   IR ANGIO INTRA EXTRACRAN SEL COM CAROTID INNOMINATE BILAT MOD SED  01/03/2021   IR ANGIO VERTEBRAL SEL SUBCLAVIAN INNOMINATE UNI L MOD SED  01/03/2021   IR ANGIO VERTEBRAL SEL VERTEBRAL UNI R MOD SED  01/03/2021   IR RADIOLOGIST EVAL & MGMT  01/13/2021   IR US GUIDE VASC ACCESS RIGHT  01/03/2021   NECK SURGERY     SPINE SURGERY     Cervical spine x 2; Hardin Negus; Critzer.    There were no vitals filed for this visit.   Subjective Assessment - 06/12/21 2046     Subjective Pt reports having a fall in the shower yesterday. Was washing his hair and soap was getting in his eye. Closed his eyes and lost balance, turning to grab the wall he grabbed the curtain instead and fell on his right side/hip. Having some soreness today with no bruising. No other new  issues. Repots he has done the ex's from his HEP at home.    Patient is accompained by: Family member   spouse   Pertinent History h/o prior CVA (08-08-18):  h/o cervical fusion C5-7 (2012): h/o neck injury 2016: DM type II, HTN,  stage 3 CKD    Limitations Walking;House hold activities;Lifting    Diagnostic tests MRI and MRA on 01-01-21    Patient Stated Goals Improve balance; walk without falling    Currently in Pain? Yes    Pain Score 4     Pain Location Hip    Pain Orientation Right    Pain Descriptors / Indicators Aching;Sharp    Pain Type Acute pain    Pain Onset Yesterday    Pain Frequency Constant    Aggravating Factors  recent fall    Pain Relieving Factors rest             Aquatic therapy at [facility] - pool temp 96 degrees   Patient seen for aquatic therapy today.  Treatment took place in water 3.5-4.5 feet deep depending upon activity.  Pt entered/exited pool with bil rails with step over step pattern.   Pt seated on bench in water on PTA  arrival to pool.  At edge of bench for bil hamstring stretching with foot propped on aquatic step for 30 sec's x 3 on each side With 2# ankle weights on each LE: long arc quads, hip abduction/adduction and flow movement of hip flexion>knee extension>hip abduction>adduction>knee flexion>hip extension for 10 reps each bil sides.   Min guard assist to walk into ~4.0-4.3 foot depth water Forward walking across pool/back for 3 laps with cues on posture as pt tends to lean forward Backward walking across pool/back for 3 laps with cues on step length Side stepping left<>right across pool/back for 3 laps with cues for posture  In ~4.0-4.3 foot water depth Static standing working on balance With feet close together for EC 30 sec's x 4 reps with posterior sway/bias noted With feet wider apart for EC head movements left<>right, up<>down and diagonals both ways for ~10 reps with min guard to min assist with increased postural sway noted  In  ~4.0 foot water depth With blue foam noodle working on dynamic balance Forward high knee marching for 3 laps across pool/back with min guard to min assist for balance Forward walking with upper trunk rotation for 3 laps across pool/back with min guard assist    Pt requires buoyancy of water for support for reduced fall risk with gait training and balance exercises with minimal UE support; exercises able to be performed safely in water without the risk of fall compared to those same exercises performed on land;  viscosity of water needed for resistance for strengthening.  Current of water provides perturbations for challenging static & dynamic standing balance.           PT Short Term Goals - 05/18/21 1414       PT SHORT TERM GOAL #1   Title Improve Berg score from 42/56 to >/= 45/56 for reduced fall risk.    Baseline 03/21/21: 48/56 scored today;  05-18-21 42/56    Time 4    Period Weeks    Status New    Target Date 06/16/21      PT SHORT TERM GOAL #2   Title Improve TUG score from 19.03 secs to </= 16.5 secs with no device for decr. fall risk.    Baseline 12.00 secs without device on 03-14-21; 19 secs on 05-18-21    Time 4    Period Weeks    Status New    Target Date 06/16/21      PT SHORT TERM GOAL #3   Title Incr. gait velocity from 2.22 ft/sec to >/= 2.6 ft/sec for incr. gait efficiency (without device).    Baseline 10.81 secs = 3.03 ft/sec without device - 03-14-21; 14.78 secs = 2.22 ft/sec on 05-18-21    Time 4    Period Weeks    Status New    Target Date 06/16/21      PT SHORT TERM GOAL #4   Title Amb. 350' nonstop without device with SBA on flat, even surface to demo improved endurance.    Baseline 115' on 05-18-21    Time 4    Period Weeks    Status New    Target Date 06/16/21      PT SHORT TERM GOAL #5   Title --    Baseline --    Status --               PT Long Term Goals - 05/18/21 1414       PT LONG TERM GOAL #1   Title  Increase Berg score  to >/= 48/56 for reduced fall risk;    Baseline 33/56 on 02-13-21; score 42/56 on 05-18-21    Time 8    Period Weeks    Status New    Target Date 07/14/21      PT LONG TERM GOAL #2   Title Improve TUG score from 19.03 secs to </= 15 secs without device to reduce fall risk.    Baseline 28.12 secs without device;  19.03 secs with bil. UE support on 05-18-21    Time 8    Period Weeks    Status New    Target Date 07/14/21      PT LONG TERM GOAL #3   Title Pt will amb. 500' with SPC on even and uneven surfaces with supervision for incr. community accessibility.    Baseline 115' with no device at end of session - stopped due to fatigue - 05-18-21    Time 8    Period Weeks    Status On-going    Target Date 07/14/21      PT LONG TERM GOAL #4   Title Perform sit to stand from mat table without UE support to demo improved LE strength. UPDATED - perform sit to stand 10 times without UE support without LOB upon initial standing to demo improved LE strength.    Baseline met 05-18-21    Time 8    Period Weeks    Status New    Target Date 07/14/21      PT LONG TERM GOAL #5   Title Independent in updated HEP for balance and strengthening.    Baseline met 05-18-21 but pt is not compliant with HEP at home    Time 8    Period Weeks    Status On-going    Target Date 07/14/21      Additional Long Term Goals   Additional Long Term Goals Yes      PT LONG TERM GOAL #6   Title Increase Foto score from 53/100 to >/= 60/100 to demo improvement in functional status.    Baseline 53/100 on 02-13-21    Time 8    Period Weeks    Status On-going    Target Date 07/14/21      PT LONG TERM GOAL #7   Title Negotiate 4 steps with 1 hand rail using a step over step sequence.    Time 8    Period Weeks    Status New    Target Date 07/14/21                   Plan - 06/12/21 2050     Clinical Impression Statement Today's skilled session continued to focus on strengthening, gait and balance  in the aquatic setting with no issues noted or reported. No increase in right hip soreness. The pt is making steady progress and should benefit from continued PT to progress toward unmet goals.    Personal Factors and Comorbidities Age;Fitness;Comorbidity 2;Transportation;Past/Current Experience    Comorbidities h/o cervical fusion C5-7 (2012), h/o neck injury 2016, h/o prior CVA in March 2020, DM type II, HTN, stage 3 CKD    Examination-Activity Limitations Locomotion Level;Transfers;Bend;Stairs;Stand;Lift;Squat    Examination-Participation Restrictions Church;Cleaning;Meal Prep;Driving;Community Activity;Interpersonal Relationship;Laundry;Shop;Yard Work    [employer]chant [employer] officer Evolving/Moderate complexity    Rehab Potential Good    PT Frequency 2x / week    PT Duration 8 weeks    PT Treatment/Interventions ADLs/Self Care Home Management;Aquatic Therapy;Gait training;Stair training;Therapeutic activities;Therapeutic  exercise;Balance training;DME Instruction;Neuromuscular re-education;Patient/family education    PT Next Visit Plan [facility] APPTs  ; cont balance and gait training - without device in clinic.  Continue aquatic training: gait and balance, tandem gait, activities with EC    PT Home Exercise Plan balance and sit to stand    Consulted and Agree with Plan of Care Patient;Family member/caregiver    Family Member Consulted [name]             Patient will benefit from skilled therapeutic intervention in order to improve the following deficits and impairments:  Decreased balance, Decreased endurance, Decreased strength, Difficulty walking, Pain, Postural dysfunction  Visit Diagnosis: Other abnormalities of gait and mobility  Unsteadiness on feet  Muscle weakness (generalized)  Hemiplegia and hemiparesis following cerebral infarction affecting right dominant side [facility])     Problem List Patient Active Problem List   Diagnosis Date Noted   TIA (transient ischemic  attack) 03/10/2020   CKD (chronic kidney disease) 08/28/2018   Retinopathy of both eyes 08/28/2018   Stroke due to embolism of left vertebral artery ([place]) 08/12/2018   Stroke ([place]) 08/08/2018   Diabetes mellitus ([place]) 07/25/2015   Neck injury 06/22/2015   Hearing loss in right ear 10/14/2011   Agent orange exposure 10/14/2011   ADD (attention deficit disorder) 10/14/2011   Hyperlipidemia 10/14/2011   BMI 28.0-28.9,adult 10/14/2011    Willow Ora, PTA, [facility] [address] [place], [place] [address] [phone] 06/12/21, 10:48 PM   Name: Jeff [name] MRN: [id number] Date of Birth: 1951/02/21

## 2021-06-13 ENCOUNTER — Ambulatory Visit: Payer: No Typology Code available for payment source | Admitting: Physical Therapy

## 2021-06-13 DIAGNOSIS — R2689 Other abnormalities of gait and mobility: Secondary | ICD-10-CM | POA: Diagnosis not present

## 2021-06-13 DIAGNOSIS — M6281 Muscle weakness (generalized): Secondary | ICD-10-CM

## 2021-06-13 DIAGNOSIS — R2681 Unsteadiness on feet: Secondary | ICD-10-CM

## 2021-06-14 NOTE — Therapy (Signed)
[facility] [address] [place], [place], [address] Phone: [phone]   Fax:  [phone]  Physical Therapy Treatment  Patient Details  Name: Jeff [name] MRN: [id number] Date of Birth: 03/01/1951 Referring Provider (PT): Eulogio Bear, DO   Encounter Date: 06/13/2021   PT End of Session - 06/14/21 0954     Visit Number 17    Number of Visits 27   15 visits authorized by [facility]   Date for PT Re-Evaluation 07/14/21    Authorization Type VA - 15 visits    Authorization Time Period additional auth 05-10-21 - 09-07-21    Authorization - Visit Number 5    Authorization - Number of Visits 15    PT Start Time 3299    PT Stop Time 2426    PT Time Calculation (min) 44 min    Activity Tolerance Patient tolerated treatment well    Behavior During Therapy [facility] for tasks assessed/performed             Past Medical History:  Diagnosis Date   ADHD (attention deficit hyperactivity disorder)    Diabetes mellitus    Hyperlipidemia    Hypertension    Renal disorder    Stroke [facility])     Past Surgical History:  Procedure Laterality Date   IR ANGIO INTRA EXTRACRAN SEL COM CAROTID INNOMINATE BILAT MOD SED  01/03/2021   IR ANGIO VERTEBRAL SEL SUBCLAVIAN INNOMINATE UNI L MOD SED  01/03/2021   IR ANGIO VERTEBRAL SEL VERTEBRAL UNI R MOD SED  01/03/2021   IR RADIOLOGIST EVAL & MGMT  01/13/2021   IR US GUIDE VASC ACCESS RIGHT  01/03/2021   NECK SURGERY     SPINE SURGERY     Cervical spine x 2; Hardin Negus; Critzer.    There were no vitals filed for this visit.   Subjective Assessment - 06/14/21 0940     Subjective Pt reports his right hip is still sore from the fall he had in the shower on Sunday evening; also reports some muscle soreness from the pool exercise yesterday (Monday)    Patient is accompained by: Family member   spouse   Pertinent History h/o prior CVA (08-08-18):  h/o cervical fusion C5-7 (2012): h/o neck injury 2016: DM type II,  HTN,  stage 3 CKD    Limitations Walking;House hold activities;Lifting    Diagnostic tests MRI and MRA on 01-01-21    Patient Stated Goals Improve balance; walk without falling    Currently in Pain? Yes    Pain Score 4     Pain Location Hip    Pain Orientation Right    Pain Descriptors / Indicators Aching;Sharp    Pain Type Acute pain    Pain Onset In the past 7 days    Pain Frequency Constant    Aggravating Factors  recent fall (Sunday evening, 06-11-21)    Pain Relieving Factors rest, non-weightbearing                               [facility] Adult PT Treatment/Exercise - 06/14/21 0001       Transfers   Transfers Sit to Stand;Stand to Sit    Sit to Stand 5: Supervision    Stand to Sit 5: Supervision    Comments 5 reps feet on floor without UE support:  5 reps feet on Airex no UE support with CGA for recovery of LOB  Ambulation/Gait   Ambulation/Gait Yes    Ambulation/Gait Assistance 5: Supervision    Ambulation Distance (Feet) 115 Feet    Assistive device None    Gait Pattern Step-through pattern    Ambulation Surface Level;Indoor      Exercises   Exercises Knee/Hip;Ankle      Knee/Hip Exercises: Standing   Heel Raises Both;1 set;10 reps   RLE unilateral 10 reps with UE support   Hip Flexion Stengthening;Right;10 reps;2 sets;Knee bent;Knee straight   1 set each knee position - 3# weight used   Hip Abduction Stengthening;Right;1 set;10 reps   3# weight used   Hip Extension Stengthening;Right;1 set;10 reps   3# weight used   Forward Step Up Right;1 set;10 reps;Hand Hold: 2;Step Height: 6"                 Balance Exercises - 06/14/21 0001       Balance Exercises: Standing   Rockerboard Anterior/posterior;EO;10 reps;UE support    Other Standing Exercises Pt performed stepping over/back of lower height orange hurdle 5 reps each leg with LUE support on counter; 2nd set of 5 reps with 2 finger support on counter with min assist for balance     Other Standing Exercises Comments touching balance bubbles (3) with each foot - standing by counter for UE support prn; cone taps (2 cones) 3 reps each foot with UE support on counter as needed;             Pt performed stepping over and back of black balance beam 10 reps each leg with UE support on counter prn with CGA     PT Short Term Goals - 06/14/21 1000       PT SHORT TERM GOAL #1   Title Improve Berg score from 42/56 to >/= 45/56 for reduced fall risk.    Baseline 03/21/21: 48/56 scored today;  05-18-21 42/56    Time 4    Period Weeks    Status New    Target Date 06/16/21      PT SHORT TERM GOAL #2   Title Improve TUG score from 19.03 secs to </= 16.5 secs with no device for decr. fall risk.    Baseline 12.00 secs without device on 03-14-21; 19 secs on 05-18-21    Time 4    Period Weeks    Status New    Target Date 06/16/21      PT SHORT TERM GOAL #3   Title Incr. gait velocity from 2.22 ft/sec to >/= 2.6 ft/sec for incr. gait efficiency (without device).    Baseline 10.81 secs = 3.03 ft/sec without device - 03-14-21; 14.78 secs = 2.22 ft/sec on 05-18-21    Time 4    Period Weeks    Status New    Target Date 06/16/21      PT SHORT TERM GOAL #4   Title Amb. 350' nonstop without device with SBA on flat, even surface to demo improved endurance.    Baseline 115' on 05-18-21    Time 4    Period Weeks    Status New    Target Date 06/16/21               PT Long Term Goals - 06/14/21 1001       PT LONG TERM GOAL #1   Title Increase Berg score to >/= 48/56 for reduced fall risk;    Baseline 33/56 on 02-13-21; score 42/56 on 05-18-21    Time 8  Period Weeks    Status New    Target Date 07/14/21      PT LONG TERM GOAL #2   Title Improve TUG score from 19.03 secs to </= 15 secs without device to reduce fall risk.    Baseline 28.12 secs without device;  19.03 secs with bil. UE support on 05-18-21    Time 8    Period Weeks    Status New    Target Date  07/14/21      PT LONG TERM GOAL #3   Title Pt will amb. 500' with SPC on even and uneven surfaces with supervision for incr. community accessibility.    Baseline 115' with no device at end of session - stopped due to fatigue - 05-18-21    Time 8    Period Weeks    Status On-going    Target Date 07/14/21      PT LONG TERM GOAL #4   Title Perform sit to stand from mat table without UE support to demo improved LE strength. UPDATED - perform sit to stand 10 times without UE support without LOB upon initial standing to demo improved LE strength.    Baseline met 05-18-21    Time 8    Period Weeks    Status New    Target Date 07/14/21      PT LONG TERM GOAL #5   Title Independent in updated HEP for balance and strengthening.    Baseline met 05-18-21 but pt is not compliant with HEP at home    Time 8    Period Weeks    Status On-going    Target Date 07/14/21      PT LONG TERM GOAL #6   Title Increase Foto score from 53/100 to >/= 60/100 to demo improvement in functional status.    Baseline 53/100 on 02-13-21    Time 8    Period Weeks    Status On-going    Target Date 07/14/21      PT LONG TERM GOAL #7   Title Negotiate 4 steps with 1 hand rail using a step over step sequence.    Time 8    Period Weeks    Status New    Target Date 07/14/21                   Plan - 06/14/21 0955     Clinical Impression Statement PT session focused on RLE strengthening and gait and balance training without use of assistive device.  Pt noted to have unsteady gait with mild LOB toward Rt side when amb. from lobby to clinic gym for start of PT session.  Pt had c/o Rt hip pain due to fall sustained in shower on Sunday (06-11-21) evening when he had eyes closed due to washing hair and had LOB when he turned around in shower.  Pt noted to have decreased RLE SLS in today's session, with need for UE support when performing SLS activities - which appears to be impacted by Rt hip soreness.  Cont with  POC.    Personal Factors and Comorbidities Age;Fitness;Comorbidity 2;Transportation;Past/Current Experience    Comorbidities h/o cervical fusion C5-7 (2012), h/o neck injury 2016, h/o prior CVA in March 2020, DM type II, HTN, stage 3 CKD    Examination-Activity Limitations Locomotion Level;Transfers;Bend;Stairs;Stand;Lift;Squat    Examination-Participation Restrictions Church;Cleaning;Meal Prep;Driving;Community Activity;Interpersonal Relationship;Laundry;Shop;Yard Work    Stability/Clinical Decision Making Evolving/Moderate complexity    Rehab Potential Good    PT Frequency 2x /  week    PT Duration 8 weeks    PT Treatment/Interventions ADLs/Self Care Home Management;Aquatic Therapy;Gait training;Stair training;Therapeutic activities;Therapeutic exercise;Balance training;DME Instruction;Neuromuscular re-education;Patient/family education    PT Next Visit Plan LAND APPTs  ; cont balance and gait training - without device in clinic.  Continue aquatic training: gait and balance, tandem gait, activities with EC    PT Home Exercise Plan balance and sit to stand    Consulted and Agree with Plan of Care Patient;Family member/caregiver    Family Member Consulted [name]             Patient will benefit from skilled therapeutic intervention in order to improve the following deficits and impairments:  Decreased balance, Decreased endurance, Decreased strength, Difficulty walking, Pain, Postural dysfunction  Visit Diagnosis: Other abnormalities of gait and mobility  Unsteadiness on feet  Muscle weakness (generalized)     Problem List Patient Active Problem List   Diagnosis Date Noted   TIA (transient ischemic attack) 03/10/2020   CKD (chronic kidney disease) 08/28/2018   Retinopathy of both eyes 08/28/2018   Stroke due to embolism of left vertebral artery ([place]) 08/12/2018   Stroke ([place]) 08/08/2018   Diabetes mellitus ([place]) 07/25/2015   Neck injury 06/22/2015   Hearing loss in right ear  10/14/2011   Agent orange exposure 10/14/2011   ADD (attention deficit disorder) 10/14/2011   Hyperlipidemia 10/14/2011   BMI 28.0-28.9,adult 10/14/2011    [place], [place], PT 06/14/2021, 10:06 AM  [facility] [address] [place], [place], [address] Phone: [phone]   Fax:  [phone]  Name: Jeff [name] MRN: [id number] Date of Birth: 07-04-1950

## 2021-06-19 ENCOUNTER — Other Ambulatory Visit: Payer: Self-pay

## 2021-06-19 ENCOUNTER — Ambulatory Visit: Payer: No Typology Code available for payment source | Admitting: Physical Therapy

## 2021-06-19 DIAGNOSIS — R2689 Other abnormalities of gait and mobility: Secondary | ICD-10-CM

## 2021-06-19 DIAGNOSIS — M6281 Muscle weakness (generalized): Secondary | ICD-10-CM

## 2021-06-19 DIAGNOSIS — R2681 Unsteadiness on feet: Secondary | ICD-10-CM

## 2021-06-20 ENCOUNTER — Encounter: Payer: Self-pay | Admitting: Physical Therapy

## 2021-06-20 ENCOUNTER — Ambulatory Visit: Payer: No Typology Code available for payment source | Admitting: Physical Therapy

## 2021-06-20 DIAGNOSIS — N189 Chronic kidney disease, unspecified: Secondary | ICD-10-CM | POA: Diagnosis not present

## 2021-06-20 DIAGNOSIS — E7800 Pure hypercholesterolemia, unspecified: Secondary | ICD-10-CM | POA: Diagnosis not present

## 2021-06-20 DIAGNOSIS — E039 Hypothyroidism, unspecified: Secondary | ICD-10-CM | POA: Diagnosis not present

## 2021-06-20 DIAGNOSIS — I639 Cerebral infarction, unspecified: Secondary | ICD-10-CM | POA: Diagnosis not present

## 2021-06-20 DIAGNOSIS — G629 Polyneuropathy, unspecified: Secondary | ICD-10-CM | POA: Diagnosis not present

## 2021-06-20 DIAGNOSIS — H3500 Unspecified background retinopathy: Secondary | ICD-10-CM | POA: Diagnosis not present

## 2021-06-20 DIAGNOSIS — I10 Essential (primary) hypertension: Secondary | ICD-10-CM | POA: Diagnosis not present

## 2021-06-20 DIAGNOSIS — R0989 Other specified symptoms and signs involving the circulatory and respiratory systems: Secondary | ICD-10-CM | POA: Diagnosis not present

## 2021-06-20 DIAGNOSIS — E1165 Type 2 diabetes mellitus with hyperglycemia: Secondary | ICD-10-CM | POA: Diagnosis not present

## 2021-06-20 NOTE — Therapy (Signed)
[facility] [address] [place], [place], [address] Phone: [phone]   Fax:  [phone]  Physical Therapy Treatment  Patient Details  Name: Jeff [name] MRN: [id number] Date of Birth: July 11, 1950 Referring Provider (PT): Eulogio Bear, DO   Encounter Date: 06/19/2021   PT End of Session - 06/20/21 0813     Visit Number 18    Number of Visits 27   15 visits authorized by [facility]   Date for PT Re-Evaluation 07/14/21    Authorization Type VA - 15 visits    Authorization Time Period additional auth 05-10-21 - 09-07-21    Authorization - Visit Number 6    Authorization - Number of Visits 15    PT Start Time 0801    PT Stop Time 0845    PT Time Calculation (min) 44 min    Equipment Utilized During Treatment Gait belt    Activity Tolerance Patient tolerated treatment well    Behavior During Therapy WFL for tasks assessed/performed             Past Medical History:  Diagnosis Date   ADHD (attention deficit hyperactivity disorder)    Diabetes mellitus    Hyperlipidemia    Hypertension    Renal disorder    Stroke [facility])     Past Surgical History:  Procedure Laterality Date   IR ANGIO INTRA EXTRACRAN SEL COM CAROTID INNOMINATE BILAT MOD SED  01/03/2021   IR ANGIO VERTEBRAL SEL SUBCLAVIAN INNOMINATE UNI L MOD SED  01/03/2021   IR ANGIO VERTEBRAL SEL VERTEBRAL UNI R MOD SED  01/03/2021   IR RADIOLOGIST EVAL & MGMT  01/13/2021   IR US GUIDE VASC ACCESS RIGHT  01/03/2021   NECK SURGERY     SPINE SURGERY     Cervical spine x 2; Hardin Negus; Critzer.    There were no vitals filed for this visit.   Subjective Assessment - 06/19/21 0759     Subjective Pt reports he is doing well today - has not had any other falls since previous session last week; pt states he feels the pool sessions are more beneficial than the land - would like to use more visits for the pool    Patient is accompained by: Family member   spouse   Pertinent History  h/o prior CVA (08-08-18):  h/o cervical fusion C5-7 (2012): h/o neck injury 2016: DM type II, HTN,  stage 3 CKD    Limitations Walking;House hold activities;Lifting    Diagnostic tests MRI and MRA on 01-01-21    Patient Stated Goals Improve balance; walk without falling    Currently in Pain? No/denies    Pain Onset In the past 7 days                [facility] PT Assessment - 06/20/21 0001       Berg Balance Test   Sit to Stand Able to stand without using hands and stabilize independently    Standing Unsupported Able to stand safely 2 minutes    Sitting with Back Unsupported but Feet Supported on Floor or Stool Able to sit safely and securely 2 minutes    Stand to Sit Sits safely with minimal use of hands    Transfers Able to transfer safely, minor use of hands    Standing Unsupported with Eyes Closed Able to stand 10 seconds with supervision    Standing Unsupported with Feet Together Able to place feet together independently and stand for 1 minute  with supervision    From Standing, Reach Forward with Outstretched Arm Can reach confidently >25 cm (10")    From Standing Position, Pick up Object from [place] to pick up shoe, needs supervision    From Standing Position, Turn to Look Behind Over each Shoulder Looks behind one side only/other side shows less weight shift    Turn 360 Degrees Able to turn 360 degrees safely but slowly   6.63, 6.79   Standing Unsupported, Alternately Place Feet on Step/Stool Able to complete 4 steps without aid or supervision    Standing Unsupported, One Foot in Front Able to plae foot ahead of the other independently and hold 30 seconds    Standing on One Leg Able to lift leg independently and hold equal to or more than 3 seconds    Total Score 45      Timed Up and Go Test   Normal TUG (seconds) 11.66   no device                          [facility] Adult PT Treatment/Exercise - 06/20/21 0001       Transfers   Transfers Sit to Stand;Stand to  Sit    Sit to Stand 5: Supervision    Stand to Sit 5: Supervision    Comments 5 reps feet on floor without UE support:  5 reps feet on Airex no UE support with CGA for recovery of LOB      Ambulation/Gait   Ambulation/Gait Yes    Ambulation/Gait Assistance 5: Supervision;4: Min guard   min guard after 290' due to increased forward trunk lean   Ambulation Distance (Feet) 350 Feet    Assistive device None    Gait Pattern Step-through pattern    Ambulation Surface Level;Indoor    Gait velocity 11.41 secs = 2.87 ft/sec with no device      Knee/Hip Exercises: Standing   Hip Flexion Stengthening;Right;10 reps;2 sets;Knee bent;Knee straight   1 set each knee position - 3# weight used   Hip Abduction Stengthening;Right;1 set;10 reps   3# weight used   Hip Extension Stengthening;Right;1 set;10 reps   3# weight used                Balance Exercises - 06/20/21 0001       Balance Exercises: Standing   Other Standing Exercises performed stepping over and back of balance beam 10 reps each leg with CGA    Other Standing Exercises Comments tap ups to 1st step (6") alternating feet x 10 reps with UE support prn on rails with CGA                  PT Short Term Goals - 06/19/21 0802       PT SHORT TERM GOAL #1   Title Improve Berg score from 42/56 to >/= 45/56 for reduced fall risk.    Baseline 03/21/21: 48/56 scored today;  05-18-21 42/56;   06-19-21   45/56    Time 4    Period Weeks    Status Achieved    Target Date 06/16/21      PT SHORT TERM GOAL #2   Title Improve TUG score from 19.03 secs to </= 16.5 secs with no device for decr. fall risk.    Baseline 12.00 secs without device on 03-14-21; 19 secs on 05-18-21;    11.66 secs without device on 06-19-21    Time 4  Period Weeks    Status Achieved    Target Date 06/16/21      PT SHORT TERM GOAL #3   Title Incr. gait velocity from 2.22 ft/sec to >/= 2.6 ft/sec for incr. gait efficiency (without device).    Baseline  10.81 secs = 3.03 ft/sec without device - 03-14-21; 14.78 secs = 2.22 ft/sec on 05-18-21;    11.41 secs = 2.87 ft/sec without device    Time 4    Period Weeks    Status Achieved    Target Date 06/16/21      PT SHORT TERM GOAL #4   Title Amb. 350' nonstop without device with SBA on flat, even surface to demo improved endurance.    Baseline 115' on 05-18-21;  350' with SBA on flat, even surface- 06-19-21 - pt needed to stop after amb. approx. 290' due to increased trunk lean and slight shuffling of Rt foot    Time 4    Period Weeks    Status Achieved    Target Date 06/16/21      PT SHORT TERM GOAL #5   Title Independent in HEP for balance & strengthening exs.               PT Long Term Goals - 06/19/21 0838       PT LONG TERM GOAL #1   Title Increase Berg score to >/= 48/56 for reduced fall risk;    Baseline 33/56 on 02-13-21; score 42/56 on 05-18-21    Time 8    Period Weeks    Status New    Target Date 07/14/21      PT LONG TERM GOAL #2   Title Improve TUG score from 19.03 secs to </= 15 secs without device to reduce fall risk.    Baseline 28.12 secs without device;  19.03 secs with bil. UE support on 05-18-21    Time 8    Period Weeks    Status New    Target Date 07/14/21      PT LONG TERM GOAL #3   Title Pt will amb. 500' with SPC on even and uneven surfaces with supervision for incr. community accessibility.    Baseline 115' with no device at end of session - stopped due to fatigue - 05-18-21    Time 8    Period Weeks    Status On-going    Target Date 07/14/21      PT LONG TERM GOAL #4   Title Perform sit to stand from mat table without UE support to demo improved LE strength. UPDATED - perform sit to stand 10 times without UE support without LOB upon initial standing to demo improved LE strength.    Baseline met 05-18-21    Time 8    Period Weeks    Status New    Target Date 07/14/21      PT LONG TERM GOAL #5   Title Independent in updated HEP for balance  and strengthening.    Baseline met 05-18-21 but pt is not compliant with HEP at home    Time 8    Period Weeks    Status On-going    Target Date 07/14/21      PT LONG TERM GOAL #6   Title Increase Foto score from 53/100 to >/= 60/100 to demo improvement in functional status.    Baseline 53/100 on 02-13-21    Time 8    Period Weeks    Status On-going  Target Date 07/14/21      PT LONG TERM GOAL #7   Title Negotiate 4 steps with 1 hand rail using a step over step sequence.    Time 8    Period Weeks    Status New    Target Date 07/14/21                   Plan - 06/20/21 0813     Clinical Impression Statement Pt has met 4/4 STG's;  Berg score has now increased to 45/56, from 42/56 in mid-Dec.; TUG score has improved to 11.66 secs and gait velocity has increased to 2.87 ft/sec without use of device.  Pt able to amb. approx. 290' prior to onset of unsteady gait with increased gait deviations including increased forward trunk lean and slight shuffling of Rt foot with decreased clearance.  Pt is progressing well -pt has made good progress since previous goal assessment on 05-18-21.  Cont with POC.    Personal Factors and Comorbidities Age;Fitness;Comorbidity 2;Transportation;Past/Current Experience    Comorbidities h/o cervical fusion C5-7 (2012), h/o neck injury 2016, h/o prior CVA in March 2020, DM type II, HTN, stage 3 CKD    Examination-Activity Limitations Locomotion Level;Transfers;Bend;Stairs;Stand;Lift;Squat    Examination-Participation Restrictions Church;Cleaning;Meal Prep;Driving;Community Activity;Interpersonal Relationship;Laundry;Shop;Yard Work    [employer] Evolving/Moderate complexity    Rehab Potential Good    PT Frequency 2x / week    PT Duration 8 weeks    PT Treatment/Interventions ADLs/Self Care Home Management;Aquatic Therapy;Gait training;Stair training;Therapeutic activities;Therapeutic exercise;Balance training;DME  Instruction;Neuromuscular re-education;Patient/family education    PT Next Visit Plan [facility] APPTs  ; cont balance and gait training - without device in clinic.  Continue aquatic training: gait and balance, tandem gait, activities with EC    PT Home Exercise Plan balance and sit to stand    Consulted and Agree with Plan of Care Patient;Family member/caregiver    Family Member Consulted [name]             Patient will benefit from skilled therapeutic intervention in order to improve the following deficits and impairments:  Decreased balance, Decreased endurance, Decreased strength, Difficulty walking, Pain, Postural dysfunction  Visit Diagnosis: Other abnormalities of gait and mobility  Unsteadiness on feet  Muscle weakness (generalized)     Problem List Patient Active Problem List   Diagnosis Date Noted   TIA (transient ischemic attack) 03/10/2020   CKD (chronic kidney disease) 08/28/2018   Retinopathy of both eyes 08/28/2018   Stroke due to embolism of left vertebral artery ([place]) 08/12/2018   Stroke ([place]) 08/08/2018   Diabetes mellitus ([place]) 07/25/2015   Neck injury 06/22/2015   Hearing loss in right ear 10/14/2011   Agent orange exposure 10/14/2011   ADD (attention deficit disorder) 10/14/2011   Hyperlipidemia 10/14/2011   BMI 28.0-28.9,adult 10/14/2011    Mikele Sifuentes, [place], PT 06/20/2021, 8:30 AM  [facility] [address] [place], [place], [address] Phone: [phone]   Fax:  [phone]  Name: Jeff [name] MRN: [id number] Date of Birth: [birth year]

## 2021-06-26 ENCOUNTER — Other Ambulatory Visit: Payer: Self-pay

## 2021-06-26 ENCOUNTER — Encounter: Payer: Self-pay | Admitting: Physical Therapy

## 2021-06-26 ENCOUNTER — Ambulatory Visit: Payer: No Typology Code available for payment source | Admitting: Physical Therapy

## 2021-06-26 DIAGNOSIS — R2681 Unsteadiness on feet: Secondary | ICD-10-CM

## 2021-06-26 DIAGNOSIS — R2689 Other abnormalities of gait and mobility: Secondary | ICD-10-CM

## 2021-06-26 DIAGNOSIS — R293 Abnormal posture: Secondary | ICD-10-CM

## 2021-06-26 DIAGNOSIS — I69351 Hemiplegia and hemiparesis following cerebral infarction affecting right dominant side: Secondary | ICD-10-CM

## 2021-06-26 DIAGNOSIS — M6281 Muscle weakness (generalized): Secondary | ICD-10-CM

## 2021-06-26 NOTE — Therapy (Signed)
[facility] [address] [place], [place], [address] Phone: [phone]   Fax:  [phone]  Physical Therapy Treatment  Patient Details  Name: Jeff [name] MRN: [id number] Date of Birth: [birth year] Referring Provider (PT): Eulogio Bear, DO   Encounter Date: 06/26/2021   PT End of Session - 06/26/21 1824     Visit Number 19    Number of Visits 27   15 visits authorized by [facility]   Date for PT Re-Evaluation 07/14/21    Authorization Type VA - 15 visits    Authorization Time Period additional auth 05-10-21 - 09-07-21    Authorization - Visit Number 7    Authorization - Number of Visits 15    PT Start Time 1100    PT Stop Time 5883    PT Time Calculation (min) 43 min    Equipment Utilized During Treatment Other (comment)   aquatic weights, single bar bells, pool noodle   Activity Tolerance Patient tolerated treatment well    Behavior During Therapy WFL for tasks assessed/performed             Past Medical History:  Diagnosis Date   ADHD (attention deficit hyperactivity disorder)    Diabetes mellitus    Hyperlipidemia    Hypertension    Renal disorder    Stroke [facility])     Past Surgical History:  Procedure Laterality Date   IR ANGIO INTRA EXTRACRAN SEL COM CAROTID INNOMINATE BILAT MOD SED  01/03/2021   IR ANGIO VERTEBRAL SEL SUBCLAVIAN INNOMINATE UNI L MOD SED  01/03/2021   IR ANGIO VERTEBRAL SEL VERTEBRAL UNI R MOD SED  01/03/2021   IR RADIOLOGIST EVAL & MGMT  01/13/2021   IR US GUIDE VASC ACCESS RIGHT  01/03/2021   NECK SURGERY     SPINE SURGERY     Cervical spine x 2; Hardin Negus; Critzer.    There were no vitals filed for this visit.   Subjective Assessment - 06/26/21 1814     Subjective No new complaints. No new falls or pain to report.    Patient is accompained by: Family member   spouse   Pertinent History h/o prior CVA (08-08-18):  h/o cervical fusion C5-7 (2012): h/o neck injury 2016: DM type II, HTN,  stage 3  CKD    Limitations Walking;House hold activities;Lifting    Diagnostic tests MRI and MRA on 01-01-21    Patient Stated Goals Improve balance; walk without falling    Currently in Pain? No/denies             Aquatic therapy at [facility] - pool temp 90 degrees   Patient seen for aquatic therapy today.  Treatment took place in water 3.5-4.5 feet deep depending upon activity.  Pt entered and  Exited the pool via step negotiation with use of bil rails with step to pattern, min guard assist for safety.    HHA to walk into pool till about 3.5-3.8 foot depth  In ~3.5 foot water depth with min guard assist pt performed the following with 2# ankle weights on bil LE's for strengthening and to assist with keeping feet anchored in pool: Forward "power" walking with emphasis on arm swing with walking for 3 laps across pool and back Backward walking with emphasis on tall posture and step length with walking for 3 laps across pool and back Side stepping across pool and back for 3 laps each way with cues on posture and step length  With noodle for support  in ~3.5 to 3.8 foot water depth working on dynamic balance activities with min guard to min assist for balance Forward marching across pool and back with emphasis on high knee marching for single leg stance balance for 3 laps across/back Tandem walking for 4 laps across and back with cues on posture and step placement.  Toe walking for 3 laps across/back with cues on posture and technique. Minor veering/instability noted at times with pt self correcting. Heel walking for 2 laps across/back with cues on posture and technique.  Minor veering/instability noted at times with pt self correcting.   At bench in water: With 2# weights pt performed the following ex's with cues on form and technique.  Long arc quads with emphasis on slow and controlled movements for 20 reps each side With LE's out in extension floating in water for hip abduction/adduction for  15 reps  Seated on bend with no weights on LE's: Sit to stand in staggered stance for 15 reps with each foot forward, HHA for balance with cues on tall posture and controlled descent for sitting back down.    Back in 4.3 foot water to work on balance activities with min guard to min assist for balance:  Static balance  with feet hip width apart Single bar bell was used for the following activities With pt holding bar bells under water at sides then lifting them up to water edge/back down for 10 reps With pt holding bar bells down under water at sides then moving them in small ranges forward/backward (cross country skiing) for 15 reps While holding the bar bells below the water at sides- pt performed shoulder horizontal abduction/adduction for 15 reps.   Dynamic balance/gait in water: Forward walking with alternating punching with single bar bells across pool/back for 3 laps with min guard to min assist. Side stepping with simultaneous shoulder abduction/adduction across pool.back for 3 laps each way with min guard assist for balance.   At side of pool Runner stretch bil LE's 30 sec's x 2 reps each side.     Pt requires buoyancy of water for support for reduced fall risk with gait training and balance exercises with minimal UE support; exercises able to be performed safely in water without the risk of fall compared to those same exercises performed on land;  viscosity of water needed for resistance for strengthening.  Current of water provides perturbations for challenging static & dynamic standing balance.              PT Short Term Goals - 06/19/21 0802       PT SHORT TERM GOAL #1   Title Improve Berg score from 42/56 to >/= 45/56 for reduced fall risk.    Baseline 03/21/21: 48/56 scored today;  05-18-21 42/56;   06-19-21   45/56    Time 4    Period Weeks    Status Achieved    Target Date 06/16/21      PT SHORT TERM GOAL #2   Title Improve TUG score from 19.03 secs to </=  16.5 secs with no device for decr. fall risk.    Baseline 12.00 secs without device on 03-14-21; 19 secs on 05-18-21;    11.66 secs without device on 06-19-21    Time 4    Period Weeks    Status Achieved    Target Date 06/16/21      PT SHORT TERM GOAL #3   Title Incr. gait velocity from 2.22 ft/sec to >/= 2.6 ft/sec  for incr. gait efficiency (without device).    Baseline 10.81 secs = 3.03 ft/sec without device - 03-14-21; 14.78 secs = 2.22 ft/sec on 05-18-21;    11.41 secs = 2.87 ft/sec without device    Time 4    Period Weeks    Status Achieved    Target Date 06/16/21      PT SHORT TERM GOAL #4   Title Amb. 350' nonstop without device with SBA on flat, even surface to demo improved endurance.    Baseline 115' on 05-18-21;  350' with SBA on flat, even surface- 06-19-21 - pt needed to stop after amb. approx. 290' due to increased trunk lean and slight shuffling of Rt foot    Time 4    Period Weeks    Status Achieved    Target Date 06/16/21      PT SHORT TERM GOAL #5   Title Independent in HEP for balance & strengthening exs.               PT Long Term Goals - 06/19/21 0838       PT LONG TERM GOAL #1   Title Increase Berg score to >/= 48/56 for reduced fall risk;    Baseline 33/56 on 02-13-21; score 42/56 on 05-18-21    Time 8    Period Weeks    Status New    Target Date 07/14/21      PT LONG TERM GOAL #2   Title Improve TUG score from 19.03 secs to </= 15 secs without device to reduce fall risk.    Baseline 28.12 secs without device;  19.03 secs with bil. UE support on 05-18-21    Time 8    Period Weeks    Status New    Target Date 07/14/21      PT LONG TERM GOAL #3   Title Pt will amb. 500' with SPC on even and uneven surfaces with supervision for incr. community accessibility.    Baseline 115' with no device at end of session - stopped due to fatigue - 05-18-21    Time 8    Period Weeks    Status On-going    Target Date 07/14/21      PT LONG TERM GOAL #4    Title Perform sit to stand from mat table without UE support to demo improved LE strength. UPDATED - perform sit to stand 10 times without UE support without LOB upon initial standing to demo improved LE strength.    Baseline met 05-18-21    Time 8    Period Weeks    Status New    Target Date 07/14/21      PT LONG TERM GOAL #5   Title Independent in updated HEP for balance and strengthening.    Baseline met 05-18-21 but pt is not compliant with HEP at home    Time 8    Period Weeks    Status On-going    Target Date 07/14/21      PT LONG TERM GOAL #6   Title Increase Foto score from 53/100 to >/= 60/100 to demo improvement in functional status.    Baseline 53/100 on 02-13-21    Time 8    Period Weeks    Status On-going    Target Date 07/14/21      PT LONG TERM GOAL #7   Title Negotiate 4 steps with 1 hand rail using a step over step sequence.    Time 8  Period Weeks    Status New    Target Date 07/14/21                   Plan - 06/26/21 1825     Clinical Impression Statement Today's skilled session continued to focus on stretching, strenghtening, balance and gait in the aquatic setting with no issues noted or reported. The pt is making steady progress and should benefit from continued PT to progress toward unmet goals.    Personal Factors and Comorbidities Age;Fitness;Comorbidity 2;Transportation;Past/Current Experience    Comorbidities h/o cervical fusion C5-7 (2012), h/o neck injury 2016, h/o prior CVA in March 2020, DM type II, HTN, stage 3 CKD    Examination-Activity Limitations Locomotion Level;Transfers;Bend;Stairs;Stand;Lift;Squat    Examination-Participation Restrictions Church;Cleaning;Meal Prep;Driving;Community Activity;Interpersonal Relationship;Laundry;Shop;Yard Work    [employer]chant [employer] officer Evolving/Moderate complexity    Rehab Potential Good    PT Frequency 2x / week    PT Duration 8 weeks    PT Treatment/Interventions ADLs/Self Care  Home Management;Aquatic Therapy;Gait training;Stair training;Therapeutic activities;Therapeutic exercise;Balance training;DME Instruction;Neuromuscular re-education;Patient/family education    PT Next Visit Plan LAND APPTs  ; cont balance and gait training - without device in clinic.  Continue aquatic training: gait and balance, tandem gait, activities with EC    PT Home Exercise Plan balance and sit to stand    Consulted and Agree with Plan of Care Patient;Family member/caregiver    Family Member Consulted [name]             Patient will benefit from skilled therapeutic intervention in order to improve the following deficits and impairments:  Decreased balance, Decreased endurance, Decreased strength, Difficulty walking, Pain, Postural dysfunction  Visit Diagnosis: Other abnormalities of gait and mobility  Unsteadiness on feet  Muscle weakness (generalized)  Hemiplegia and hemiparesis following cerebral infarction affecting right dominant side (HCC)  Abnormal posture     Problem List Patient Active Problem List   Diagnosis Date Noted   TIA (transient ischemic attack) 03/10/2020   CKD (chronic kidney disease) 08/28/2018   Retinopathy of both eyes 08/28/2018   Stroke due to embolism of left vertebral artery ([place]) 08/12/2018   Stroke ([place]) 08/08/2018   Diabetes mellitus ([place]) 07/25/2015   Neck injury 06/22/2015   Hearing loss in right ear 10/14/2011   Agent orange exposure 10/14/2011   ADD (attention deficit disorder) 10/14/2011   Hyperlipidemia 10/14/2011   BMI 28.0-28.9,adult 10/14/2011    Willow Ora, PTA, [facility] [address] [place], [place] [address] [phone] 06/26/21, 10:44 PM   Name: Jeff [name] MRN: [id number] Date of Birth: 1950/06/23

## 2021-06-27 ENCOUNTER — Ambulatory Visit: Payer: No Typology Code available for payment source | Admitting: Physical Therapy

## 2021-06-27 VITALS — BP 132/78

## 2021-06-27 NOTE — Therapy (Signed)
[facility] [address] [place], [place], [address] Phone: [phone]   Fax:  [phone]  Physical Therapy Treatment - ARRIVED/NO CHARGE  Patient Details  Name: Jeff [name] MRN: [id number] Date of Birth: 1951/02/25 Referring Provider (PT): Eulogio Bear, DO   Encounter Date: 06/27/2021   PT End of Session - 06/27/21 0904     Visit Number 19    Number of Visits 27   15 visits authorized by [facility]   Date for PT Re-Evaluation 07/14/21    Authorization Type VA - 15 visits    Authorization Time Period additional auth 05-10-21 - 09-07-21    Authorization - Visit Number 7    Authorization - Number of Visits 15    PT Start Time 7078    PT Stop Time 0902    PT Time Calculation (min) 12 min    Equipment Utilized During Treatment --   aquatic weights, single bar bells, pool noodle   Activity Tolerance Patient tolerated treatment well;Patient limited by fatigue;Patient limited by pain    Behavior During Therapy WFL for tasks assessed/performed             Past Medical History:  Diagnosis Date   ADHD (attention deficit hyperactivity disorder)    Diabetes mellitus    Hyperlipidemia    Hypertension    Renal disorder    Stroke [facility])     Past Surgical History:  Procedure Laterality Date   IR ANGIO INTRA EXTRACRAN SEL COM CAROTID INNOMINATE BILAT MOD SED  01/03/2021   IR ANGIO VERTEBRAL SEL SUBCLAVIAN INNOMINATE UNI L MOD SED  01/03/2021   IR ANGIO VERTEBRAL SEL VERTEBRAL UNI R MOD SED  01/03/2021   IR RADIOLOGIST EVAL & MGMT  01/13/2021   IR US GUIDE VASC ACCESS RIGHT  01/03/2021   NECK SURGERY     SPINE SURGERY     Cervical spine x 2; Hardin Negus; Critzer.    Vitals:   06/27/21 0850  BP: 132/78     Subjective Assessment - 06/27/21 0850     Subjective Pt states he does not feel well today; reports he was very tired after the pool session yesterday - went home and slept; states he feels bloated and has a headache at this time -  started this morning around 6:00 am; pt reports he didn't want to cancel this appt but states "I really want to go home"    Currently in Pain? Yes    Pain Score 6     Pain Location Head    Pain Descriptors / Indicators Headache    Pain Type Acute pain   started early this am, around 6:00 am   Pain Onset Today                    Pt arrived to PT session, stating he did not feel good at all and wanted to go back home; pt stated he didn't want to cancel today's PT appt because he didn't know if he would be able to make it up  Pt's blood sugar level WNL's and BP 132/78; pt reported he had HA (6-7/10 intensity) and had taken Tylenol but still had headache -  Arrived - no charge for today's visit                      PT Short Term Goals - 06/27/21 0907       PT SHORT TERM GOAL #1   Title  Improve Berg score from 42/56 to >/= 45/56 for reduced fall risk.    Baseline 03/21/21: 48/56 scored today;  05-18-21 42/56;   06-19-21   45/56    Time 4    Period Weeks    Status Achieved    Target Date 06/16/21      PT SHORT TERM GOAL #2   Title Improve TUG score from 19.03 secs to </= 16.5 secs with no device for decr. fall risk.    Baseline 12.00 secs without device on 03-14-21; 19 secs on 05-18-21;    11.66 secs without device on 06-19-21    Time 4    Period Weeks    Status Achieved    Target Date 06/16/21      PT SHORT TERM GOAL #3   Title Incr. gait velocity from 2.22 ft/sec to >/= 2.6 ft/sec for incr. gait efficiency (without device).    Baseline 10.81 secs = 3.03 ft/sec without device - 03-14-21; 14.78 secs = 2.22 ft/sec on 05-18-21;    11.41 secs = 2.87 ft/sec without device    Time 4    Period Weeks    Status Achieved    Target Date 06/16/21      PT SHORT TERM GOAL #4   Title Amb. 350' nonstop without device with SBA on flat, even surface to demo improved endurance.    Baseline 115' on 05-18-21;  350' with SBA on flat, even surface- 06-19-21 - pt needed to  stop after amb. approx. 290' due to increased trunk lean and slight shuffling of Rt foot    Time 4    Period Weeks    Status Achieved    Target Date 06/16/21      PT SHORT TERM GOAL #5   Title Independent in HEP for balance & strengthening exs.               PT Long Term Goals - 06/27/21 0908       PT LONG TERM GOAL #1   Title Increase Berg score to >/= 48/56 for reduced fall risk;    Baseline 33/56 on 02-13-21; score 42/56 on 05-18-21    Time 8    Period Weeks    Status New    Target Date 07/14/21      PT LONG TERM GOAL #2   Title Improve TUG score from 19.03 secs to </= 15 secs without device to reduce fall risk.    Baseline 28.12 secs without device;  19.03 secs with bil. UE support on 05-18-21    Time 8    Period Weeks    Status New    Target Date 07/14/21      PT LONG TERM GOAL #3   Title Pt will amb. 500' with SPC on even and uneven surfaces with supervision for incr. community accessibility.    Baseline 115' with no device at end of session - stopped due to fatigue - 05-18-21    Time 8    Period Weeks    Status On-going    Target Date 07/14/21      PT LONG TERM GOAL #4   Title Perform sit to stand from mat table without UE support to demo improved LE strength. UPDATED - perform sit to stand 10 times without UE support without LOB upon initial standing to demo improved LE strength.    Baseline met 05-18-21    Time 8    Period Weeks    Status New    Target Date  07/14/21      PT LONG TERM GOAL #5   Title Independent in updated HEP for balance and strengthening.    Baseline met 05-18-21 but pt is not compliant with HEP at home    Time 8    Period Weeks    Status On-going    Target Date 07/14/21      PT LONG TERM GOAL #6   Title Increase Foto score from 53/100 to >/= 60/100 to demo improvement in functional status.    Baseline 53/100 on 02-13-21    Time 8    Period Weeks    Status On-going    Target Date 07/14/21      PT LONG TERM GOAL #7   Title  Negotiate 4 steps with 1 hand rail using a step over step sequence.    Time 8    Period Weeks    Status New    Target Date 07/14/21                   Plan - 06/27/21 0906     Clinical Impression Statement No charge for today as pt arrived with c/o headache and not feeling well; pt had blood sugar monitor with reading 168 (WNL's) and BP was recorded as 132/78.  Pt reported feeling bloated;  requested to cancel today's PT session.  Arrived - No charge    Personal Factors and Comorbidities Age;Fitness;Comorbidity 2;Transportation;Past/Current Experience    Comorbidities h/o cervical fusion C5-7 (2012), h/o neck injury 2016, h/o prior CVA in March 2020, DM type II, HTN, stage 3 CKD    Examination-Activity Limitations Locomotion Level;Transfers;Bend;Stairs;Stand;Lift;Squat    Examination-Participation Restrictions Church;Cleaning;Meal Prep;Driving;Community Activity;Interpersonal Relationship;Laundry;Shop;Yard Work    [employer] officer Evolving/Moderate complexity    Rehab Potential Good    PT Frequency 2x / week    PT Duration 8 weeks    PT Treatment/Interventions ADLs/Self Care Home Management;Aquatic Therapy;Gait training;Stair training;Therapeutic activities;Therapeutic exercise;Balance training;DME Instruction;Neuromuscular re-education;Patient/family education    PT Next Visit Plan [facility] APPTs  ; cont balance and gait training - without device in clinic.  Continue aquatic training: gait and balance, tandem gait, activities with EC    PT Home Exercise Plan balance and sit to stand    Consulted and Agree with Plan of Care Patient;Family member/caregiver    Family Member Consulted [name]             Patient will benefit from skilled therapeutic intervention in order to improve the following deficits and impairments:  Decreased balance, Decreased endurance, Decreased strength, Difficulty walking, Pain, Postural dysfunction  Visit Diagnosis: Other abnormalities  of gait and mobility  Unsteadiness on feet     Problem List Patient Active Problem List   Diagnosis Date Noted   TIA (transient ischemic attack) 03/10/2020   CKD (chronic kidney disease) 08/28/2018   Retinopathy of both eyes 08/28/2018   Stroke due to embolism of left vertebral artery ([place]) 08/12/2018   Stroke ([place]) 08/08/2018   Diabetes mellitus ([place]) 07/25/2015   Neck injury 06/22/2015   Hearing loss in right ear 10/14/2011   Agent orange exposure 10/14/2011   ADD (attention deficit disorder) 10/14/2011   Hyperlipidemia 10/14/2011   BMI 28.0-28.9,adult 10/14/2011    Axle Parfait, [place], PT 06/27/2021, 9:10 AM  [facility] [address] [place], [place], [address] Phone: [phone]   Fax:  [phone]  Name: Jeff [name] MRN: [id number] Date of Birth: [birth year]

## 2021-06-29 ENCOUNTER — Other Ambulatory Visit: Payer: Self-pay

## 2021-06-29 ENCOUNTER — Encounter (INDEPENDENT_AMBULATORY_CARE_PROVIDER_SITE_OTHER): Payer: Medicare PPO | Admitting: Ophthalmology

## 2021-06-29 DIAGNOSIS — H35033 Hypertensive retinopathy, bilateral: Secondary | ICD-10-CM | POA: Diagnosis not present

## 2021-06-29 DIAGNOSIS — E113391 Type 2 diabetes mellitus with moderate nonproliferative diabetic retinopathy without macular edema, right eye: Secondary | ICD-10-CM | POA: Diagnosis not present

## 2021-06-29 DIAGNOSIS — E113312 Type 2 diabetes mellitus with moderate nonproliferative diabetic retinopathy with macular edema, left eye: Secondary | ICD-10-CM

## 2021-06-29 DIAGNOSIS — H2703 Aphakia, bilateral: Secondary | ICD-10-CM | POA: Diagnosis not present

## 2021-06-29 DIAGNOSIS — H43813 Vitreous degeneration, bilateral: Secondary | ICD-10-CM | POA: Diagnosis not present

## 2021-06-29 DIAGNOSIS — I10 Essential (primary) hypertension: Secondary | ICD-10-CM | POA: Diagnosis not present

## 2021-07-03 ENCOUNTER — Other Ambulatory Visit: Payer: Self-pay

## 2021-07-03 ENCOUNTER — Ambulatory Visit: Payer: No Typology Code available for payment source | Admitting: Physical Therapy

## 2021-07-03 ENCOUNTER — Encounter: Payer: Self-pay | Admitting: Physical Therapy

## 2021-07-03 DIAGNOSIS — M6281 Muscle weakness (generalized): Secondary | ICD-10-CM

## 2021-07-03 DIAGNOSIS — R293 Abnormal posture: Secondary | ICD-10-CM

## 2021-07-03 DIAGNOSIS — I69351 Hemiplegia and hemiparesis following cerebral infarction affecting right dominant side: Secondary | ICD-10-CM

## 2021-07-03 DIAGNOSIS — R2681 Unsteadiness on feet: Secondary | ICD-10-CM

## 2021-07-03 DIAGNOSIS — R2689 Other abnormalities of gait and mobility: Secondary | ICD-10-CM

## 2021-07-03 NOTE — Therapy (Signed)
[facility] [address] [place], [place], [address] Phone: [phone]   Fax:  [phone]  Physical Therapy Treatment  Patient Details  Name: Jeff [name] MRN: [id number] Date of Birth: March 31, 1951 Referring Provider (PT): Eulogio Bear, DO   Encounter Date: 07/03/2021   PT End of Session - 07/03/21 1323     Visit Number 20    Number of Visits 27   15 visits authorized by [facility]   Date for PT Re-Evaluation 07/14/21    Authorization Type VA - 15 visits    Authorization Time Period additional auth 05-10-21 - 09-07-21    Authorization - Visit Number 8    Authorization - Number of Visits 15    PT Start Time 1100    PT Stop Time 1140    PT Time Calculation (min) 40 min    Equipment Utilized During Treatment --   aquatic weights, pool noodle   Activity Tolerance Patient tolerated treatment well;Patient limited by fatigue    Behavior During Therapy WFL for tasks assessed/performed             Past Medical History:  Diagnosis Date   ADHD (attention deficit hyperactivity disorder)    Diabetes mellitus    Hyperlipidemia    Hypertension    Renal disorder    Stroke [facility])     Past Surgical History:  Procedure Laterality Date   IR ANGIO INTRA EXTRACRAN SEL COM CAROTID INNOMINATE BILAT MOD SED  01/03/2021   IR ANGIO VERTEBRAL SEL SUBCLAVIAN INNOMINATE UNI L MOD SED  01/03/2021   IR ANGIO VERTEBRAL SEL VERTEBRAL UNI R MOD SED  01/03/2021   IR RADIOLOGIST EVAL & MGMT  01/13/2021   IR US GUIDE VASC ACCESS RIGHT  01/03/2021   NECK SURGERY     SPINE SURGERY     Cervical spine x 2; Hardin Negus; Critzer.    There were no vitals filed for this visit.   Subjective Assessment - 07/03/21 1321     Subjective Pt's blood sugar dropping as he was walking into the building for aquatic therapy. Pt able to get a snack with blood sugar recovering to 120. No other new complaints or issues to report. No falls or pain to report.    Patient is  accompained by: Family member   spouse   Pertinent History h/o prior CVA (08-08-18):  h/o cervical fusion C5-7 (2012): h/o neck injury 2016: DM type II, HTN,  stage 3 CKD    Limitations Walking;House hold activities;Lifting    Diagnostic tests MRI and MRA on 01-01-21    Patient Stated Goals Improve balance; walk without falling    Currently in Pain? No/denies    Pain Score 0-No pain             Aquatic therapy at [facility] - pool temp 84 degrees   Patient seen for aquatic therapy today.  Treatment took place in water 3.5-4.5 feet deep depending upon activity.  Pt entered and  exited pool via steps with bil rails, reciprocal stepping pattern.  Pt transitioned from steps to water depth of ~4.0 to 4.3 foot water depth. Min guard to min assist for balance with the following: Forward walking with emphasis on "power walk" with arms bent at elbow working on arm swing and large steps for 4 laps across pool/back Backward walking with emphasis on tall posture and step length for 4 laps across pool/back Side stepping with emphasis on step length/posture for 4 laps across pool/back  With  yellow pool noodle: Forward high knee marching with target to hip noodle with knee for 4 laps across/back Tandem walking for 4 laps across/back with pt using targeting to assist with staying in straight line  Transitioned to warmer water of smaller Jacuzzi/pool: pt using bil rails to enter/exit this area as well Seated on bench in water: with water jets on at time to provide gentle pertubation's for increased balance challenge Sit to stands x 10 reps with feet in parallel position, then 10 reps each foot forward with feet in staggered stance  With 2.5# ankle weights on bil LE's: Alternating long arc quads for 20 reps each side Alternating marching x 20 reps on each side With LE's extended out to float in water for hip abduction/adduction x 20 reps  Pt requires buoyancy of water for support for reduced fall risk  with gait training and balance exercises with minimal UE support; exercises able to be performed safely in water without the risk of fall compared to those same exercises performed on land;  viscosity of water needed for resistance for strengthening.  Current of water provides perturbations for challenging static & dynamic standing balance.            PT Short Term Goals - 06/27/21 0907       PT SHORT TERM GOAL #1   Title Improve Berg score from 42/56 to >/= 45/56 for reduced fall risk.    Baseline 03/21/21: 48/56 scored today;  05-18-21 42/56;   06-19-21   45/56    Time 4    Period Weeks    Status Achieved    Target Date 06/16/21      PT SHORT TERM GOAL #2   Title Improve TUG score from 19.03 secs to </= 16.5 secs with no device for decr. fall risk.    Baseline 12.00 secs without device on 03-14-21; 19 secs on 05-18-21;    11.66 secs without device on 06-19-21    Time 4    Period Weeks    Status Achieved    Target Date 06/16/21      PT SHORT TERM GOAL #3   Title Incr. gait velocity from 2.22 ft/sec to >/= 2.6 ft/sec for incr. gait efficiency (without device).    Baseline 10.81 secs = 3.03 ft/sec without device - 03-14-21; 14.78 secs = 2.22 ft/sec on 05-18-21;    11.41 secs = 2.87 ft/sec without device    Time 4    Period Weeks    Status Achieved    Target Date 06/16/21      PT SHORT TERM GOAL #4   Title Amb. 350' nonstop without device with SBA on flat, even surface to demo improved endurance.    Baseline 115' on 05-18-21;  350' with SBA on flat, even surface- 06-19-21 - pt needed to stop after amb. approx. 290' due to increased trunk lean and slight shuffling of Rt foot    Time 4    Period Weeks    Status Achieved    Target Date 06/16/21      PT SHORT TERM GOAL #5   Title Independent in HEP for balance & strengthening exs.               PT Long Term Goals - 06/27/21 0908       PT LONG TERM GOAL #1   Title Increase Berg score to >/= 48/56 for reduced fall risk;     Baseline 33/56 on 02-13-21; score 42/56 on 05-18-21  Time 8    Period Weeks    Status New    Target Date 07/14/21      PT LONG TERM GOAL #2   Title Improve TUG score from 19.03 secs to </= 15 secs without device to reduce fall risk.    Baseline 28.12 secs without device;  19.03 secs with bil. UE support on 05-18-21    Time 8    Period Weeks    Status New    Target Date 07/14/21      PT LONG TERM GOAL #3   Title Pt will amb. 500' with SPC on even and uneven surfaces with supervision for incr. community accessibility.    Baseline 115' with no device at end of session - stopped due to fatigue - 05-18-21    Time 8    Period Weeks    Status On-going    Target Date 07/14/21      PT LONG TERM GOAL #4   Title Perform sit to stand from mat table without UE support to demo improved LE strength. UPDATED - perform sit to stand 10 times without UE support without LOB upon initial standing to demo improved LE strength.    Baseline met 05-18-21    Time 8    Period Weeks    Status New    Target Date 07/14/21      PT LONG TERM GOAL #5   Title Independent in updated HEP for balance and strengthening.    Baseline met 05-18-21 but pt is not compliant with HEP at home    Time 8    Period Weeks    Status On-going    Target Date 07/14/21      PT LONG TERM GOAL #6   Title Increase Foto score from 53/100 to >/= 60/100 to demo improvement in functional status.    Baseline 53/100 on 02-13-21    Time 8    Period Weeks    Status On-going    Target Date 07/14/21      PT LONG TERM GOAL #7   Title Negotiate 4 steps with 1 hand rail using a step over step sequence.    Time 8    Period Weeks    Status New    Target Date 07/14/21                   Plan - 07/03/21 1325     Clinical Impression Statement Today's skilled session continued to focus on strengthening, gait and balance in the aquatic setting with no issus noted. Blood glucose remained WNL's after snack before entering the  pool. The pt is making progress and shoud benefit from continued PT to progress toward unmet goals.    Personal Factors and Comorbidities Age;Fitness;Comorbidity 2;Transportation;Past/Current Experience    Comorbidities h/o cervical fusion C5-7 (2012), h/o neck injury 2016, h/o prior CVA in March 2020, DM type II, HTN, stage 3 CKD    Examination-Activity Limitations Locomotion Level;Transfers;Bend;Stairs;Stand;Lift;Squat    Examination-Participation Restrictions Church;Cleaning;Meal Prep;Driving;Community Activity;Interpersonal Relationship;Laundry;Shop;Yard Work    [employer]chant [employer] officer Evolving/Moderate complexity    Rehab Potential Good    PT Frequency 2x / week    PT Duration 8 weeks    PT Treatment/Interventions ADLs/Self Care Home Management;Aquatic Therapy;Gait training;Stair training;Therapeutic activities;Therapeutic exercise;Balance training;DME Instruction;Neuromuscular re-education;Patient/family education    PT Next Visit Plan LAND APPTs  ; cont balance and gait training - without device in clinic.  Continue aquatic training: gait and balance, tandem gait, activities with EC  PT Home Exercise Plan balance and sit to stand    Consulted and Agree with Plan of Care Patient;Family member/caregiver    Family Member Consulted [name]             Patient will benefit from skilled therapeutic intervention in order to improve the following deficits and impairments:  Decreased balance, Decreased endurance, Decreased strength, Difficulty walking, Pain, Postural dysfunction  Visit Diagnosis: Other abnormalities of gait and mobility  Unsteadiness on feet  Muscle weakness (generalized)  Hemiplegia and hemiparesis following cerebral infarction affecting right dominant side (HCC)  Abnormal posture     Problem List Patient Active Problem List   Diagnosis Date Noted   TIA (transient ischemic attack) 03/10/2020   CKD (chronic kidney disease) 08/28/2018   Retinopathy  of both eyes 08/28/2018   Stroke due to embolism of left vertebral artery ([place]) 08/12/2018   Stroke ([place]) 08/08/2018   Diabetes mellitus ([place]) 07/25/2015   Neck injury 06/22/2015   Hearing loss in right ear 10/14/2011   Agent orange exposure 10/14/2011   ADD (attention deficit disorder) 10/14/2011   Hyperlipidemia 10/14/2011   BMI 28.0-28.9,adult 10/14/2011    Willow Ora, PTA, [facility] [address] [place], [place] [address] [phone] 07/03/21, 1:28 PM   Name: MOO GRAVLEY MRN: [id number] Date of Birth: 1950/10/30

## 2021-07-04 ENCOUNTER — Ambulatory Visit: Payer: No Typology Code available for payment source | Admitting: Physical Therapy

## 2021-07-06 ENCOUNTER — Other Ambulatory Visit: Payer: Self-pay

## 2021-07-06 ENCOUNTER — Ambulatory Visit: Payer: No Typology Code available for payment source | Attending: Internal Medicine | Admitting: Physical Therapy

## 2021-07-06 ENCOUNTER — Encounter: Payer: Self-pay | Admitting: Physical Therapy

## 2021-07-06 DIAGNOSIS — R2689 Other abnormalities of gait and mobility: Secondary | ICD-10-CM | POA: Diagnosis present

## 2021-07-06 DIAGNOSIS — R2681 Unsteadiness on feet: Secondary | ICD-10-CM

## 2021-07-06 DIAGNOSIS — I69351 Hemiplegia and hemiparesis following cerebral infarction affecting right dominant side: Secondary | ICD-10-CM | POA: Insufficient documentation

## 2021-07-06 DIAGNOSIS — M6281 Muscle weakness (generalized): Secondary | ICD-10-CM | POA: Diagnosis present

## 2021-07-06 DIAGNOSIS — R293 Abnormal posture: Secondary | ICD-10-CM | POA: Diagnosis present

## 2021-07-06 NOTE — Therapy (Signed)
[facility] [address] [place], [place], [address] Phone: [phone]   Fax:  [phone]  Physical Therapy Treatment  Patient Details  Name: Jeff [name] MRN: [id number] Date of Birth: [birth year] Referring Provider (PT): Eulogio Bear, DO   Encounter Date: 07/06/2021   PT End of Session - 07/06/21 1831     Visit Number 21    Number of Visits 27   15 visits authorized by [facility]   Date for PT Re-Evaluation 07/14/21    Authorization Type VA - 15 visits    Authorization Time Period additional auth 05-10-21 - 09-07-21    Authorization - Visit Number 9    Authorization - Number of Visits 15    PT Start Time 0801    PT Stop Time 0845    PT Time Calculation (min) 44 min    Equipment Utilized During Treatment --   aquatic weights, pool noodle   Activity Tolerance Patient tolerated treatment well;Patient limited by fatigue    Behavior During Therapy WFL for tasks assessed/performed             Past Medical History:  Diagnosis Date   ADHD (attention deficit hyperactivity disorder)    Diabetes mellitus    Hyperlipidemia    Hypertension    Renal disorder    Stroke [facility])     Past Surgical History:  Procedure Laterality Date   IR ANGIO INTRA EXTRACRAN SEL COM CAROTID INNOMINATE BILAT MOD SED  01/03/2021   IR ANGIO VERTEBRAL SEL SUBCLAVIAN INNOMINATE UNI L MOD SED  01/03/2021   IR ANGIO VERTEBRAL SEL VERTEBRAL UNI R MOD SED  01/03/2021   IR RADIOLOGIST EVAL & MGMT  01/13/2021   IR US GUIDE VASC ACCESS RIGHT  01/03/2021   NECK SURGERY     SPINE SURGERY     Cervical spine x 2; Hardin Negus; Critzer.    There were no vitals filed for this visit.   Subjective Assessment - 07/06/21 0806     Subjective Pt reports he feels really good today - states he slept well last night and feels good this morning; reports his balance is better - feels that using the weights with leg exercises in the pool has helped his legs get stronger    Patient  is accompained by: Family member   wife, [name]   Currently in Pain? No/denies                               [facility] Adult PT Treatment/Exercise - 07/06/21 0814       Transfers   Transfers Sit to Stand;Stand to Sit    Sit to Stand 5: Supervision    Stand to Sit 5: Supervision    Number of Reps Other reps (comment)   5 reps feet on floor - no UE support used     Ambulation/Gait   Ambulation/Gait Yes    Ambulation/Gait Assistance 5: Supervision    Ambulation Distance (Feet) 230 Feet    Assistive device None    Gait Pattern Step-through pattern    Ambulation Surface Level;Indoor      Knee/Hip Exercises: Standing   Heel Raises Both;1 set;10 reps    Hip Flexion Stengthening;Right;10 reps;Knee straight;1 set;Left   5# weight used   Hip Abduction Stengthening;Right;1 set;10 reps;Left   5# weight used   Hip Extension Stengthening;Right;Left;1 set;10 reps;Knee straight   5# weight used   Forward Step Up Right;Left;1 set;10  reps;Hand Hold: 1;Step Height: 6"    Rocker Board Other (comment)   20 reps with 1 UE support                Balance Exercises - 07/06/21 0001       Balance Exercises: Standing   Rockerboard Anterior/posterior;EO;10 reps;UE support    Tandem Gait Forward;1 rep   inside // bars with UE support prn   Sidestepping 2 reps;Other (comment)   with crossover step in front of other leg - inside // bars with UE support prn   Marching Foam/compliant surface;Solid surface;Static;10 reps   10 reps on floor, 10 reps on Airex   Other Standing Exercises Cone taps to 2 cones without UE support with CGA    Other Standing Exercises Comments tap ups to 1st step (6") alternating feet x 5 reps with UE support prn on rails with CGA; tap ups to 2nd step 10 reps each and then to 3rd step with UE support on rails with CGA                  PT Short Term Goals - 07/06/21 1840       PT SHORT TERM GOAL #1   Title Improve Berg score from 42/56 to >/= 45/56  for reduced fall risk.    Baseline 03/21/21: 48/56 scored today;  05-18-21 42/56;   06-19-21   45/56    Time 4    Period Weeks    Status Achieved    Target Date 06/16/21      PT SHORT TERM GOAL #2   Title Improve TUG score from 19.03 secs to </= 16.5 secs with no device for decr. fall risk.    Baseline 12.00 secs without device on 03-14-21; 19 secs on 05-18-21;    11.66 secs without device on 06-19-21    Time 4    Period Weeks    Status Achieved    Target Date 06/16/21      PT SHORT TERM GOAL #3   Title Incr. gait velocity from 2.22 ft/sec to >/= 2.6 ft/sec for incr. gait efficiency (without device).    Baseline 10.81 secs = 3.03 ft/sec without device - 03-14-21; 14.78 secs = 2.22 ft/sec on 05-18-21;    11.41 secs = 2.87 ft/sec without device    Time 4    Period Weeks    Status Achieved    Target Date 06/16/21      PT SHORT TERM GOAL #4   Title Amb. 350' nonstop without device with SBA on flat, even surface to demo improved endurance.    Baseline 115' on 05-18-21;  350' with SBA on flat, even surface- 06-19-21 - pt needed to stop after amb. approx. 290' due to increased trunk lean and slight shuffling of Rt foot    Time 4    Period Weeks    Status Achieved    Target Date 06/16/21      PT SHORT TERM GOAL #5   Title Independent in HEP for balance & strengthening exs.               PT Long Term Goals - 07/06/21 1840       PT LONG TERM GOAL #1   Title Increase Berg score to >/= 48/56 for reduced fall risk;    Baseline 33/56 on 02-13-21; score 42/56 on 05-18-21    Time 8    Period Weeks    Status New    Target Date 07/14/21  PT LONG TERM GOAL #2   Title Improve TUG score from 19.03 secs to </= 15 secs without device to reduce fall risk.    Baseline 28.12 secs without device;  19.03 secs with bil. UE support on 05-18-21    Time 8    Period Weeks    Status New    Target Date 07/14/21      PT LONG TERM GOAL #3   Title Pt will amb. 500' with SPC on even and uneven  surfaces with supervision for incr. community accessibility.    Baseline 115' with no device at end of session - stopped due to fatigue - 05-18-21    Time 8    Period Weeks    Status On-going    Target Date 07/14/21      PT LONG TERM GOAL #4   Title Perform sit to stand from mat table without UE support to demo improved LE strength. UPDATED - perform sit to stand 10 times without UE support without LOB upon initial standing to demo improved LE strength.    Baseline met 05-18-21    Time 8    Period Weeks    Status New    Target Date 07/14/21      PT LONG TERM GOAL #5   Title Independent in updated HEP for balance and strengthening.    Baseline met 05-18-21 but pt is not compliant with HEP at home    Time 8    Period Weeks    Status On-going    Target Date 07/14/21      PT LONG TERM GOAL #6   Title Increase Foto score from 53/100 to >/= 60/100 to demo improvement in functional status.    Baseline 53/100 on 02-13-21    Time 8    Period Weeks    Status On-going    Target Date 07/14/21      PT LONG TERM GOAL #7   Title Negotiate 4 steps with 1 hand rail using a step over step sequence.    Time 8    Period Weeks    Status New    Target Date 07/14/21                   Plan - 07/06/21 1835     Clinical Impression Statement Pt's balance and gait significantly improved today compared to performance in previous sessions.  Pt amb. with normal BOS, compared to his usual wide BOS in previous sessions with lateral weight shift.  Pt's balance much improved with pt able to perform standing balance exercises with minimal to no UE support.  Cont with POC.    Personal Factors and Comorbidities Age;Fitness;Comorbidity 2;Transportation;Past/Current Experience    Comorbidities h/o cervical fusion C5-7 (2012), h/o neck injury 2016, h/o prior CVA in March 2020, DM type II, HTN, stage 3 CKD    Examination-Activity Limitations Locomotion Level;Transfers;Bend;Stairs;Stand;Lift;Squat     Examination-Participation Restrictions Church;Cleaning;Meal Prep;Driving;Community Activity;Interpersonal Relationship;Laundry;Shop;Yard Work    [employer] officer Evolving/Moderate complexity    Rehab Potential Good    PT Frequency 2x / week    PT Duration 8 weeks    PT Treatment/Interventions ADLs/Self Care Home Management;Aquatic Therapy;Gait training;Stair training;Therapeutic activities;Therapeutic exercise;Balance training;DME Instruction;Neuromuscular re-education;Patient/family education    PT Next Visit Plan [facility] APPTs  ; cont balance and gait training - without device in clinic.  Continue aquatic training: gait and balance, tandem gait, activities with EC    PT Home Exercise Plan balance and sit to stand  Consulted and Agree with Plan of Care Patient;Family member/caregiver    Family Member Consulted [name]             Patient will benefit from skilled therapeutic intervention in order to improve the following deficits and impairments:  Decreased balance, Decreased endurance, Decreased strength, Difficulty walking, Pain, Postural dysfunction  Visit Diagnosis: Other abnormalities of gait and mobility  Unsteadiness on feet  Muscle weakness (generalized)     Problem List Patient Active Problem List   Diagnosis Date Noted   TIA (transient ischemic attack) 03/10/2020   CKD (chronic kidney disease) 08/28/2018   Retinopathy of both eyes 08/28/2018   Stroke due to embolism of left vertebral artery ([place]) 08/12/2018   Stroke ([place]) 08/08/2018   Diabetes mellitus ([place]) 07/25/2015   Neck injury 06/22/2015   Hearing loss in right ear 10/14/2011   Agent orange exposure 10/14/2011   ADD (attention deficit disorder) 10/14/2011   Hyperlipidemia 10/14/2011   BMI 28.0-28.9,adult 10/14/2011    Melesio Madara, [place], PT 07/06/2021, [facility] [address] [place], [place], [address] Phone:  [phone]   Fax:  [phone]  Name: OSMANI KERSTEN MRN: [id number] Date of Birth: 1951-05-21

## 2021-07-10 ENCOUNTER — Ambulatory Visit: Payer: No Typology Code available for payment source | Admitting: Physical Therapy

## 2021-07-10 ENCOUNTER — Other Ambulatory Visit: Payer: Self-pay

## 2021-07-10 ENCOUNTER — Encounter: Payer: Self-pay | Admitting: Physical Therapy

## 2021-07-10 DIAGNOSIS — R2681 Unsteadiness on feet: Secondary | ICD-10-CM

## 2021-07-10 DIAGNOSIS — M6281 Muscle weakness (generalized): Secondary | ICD-10-CM

## 2021-07-10 DIAGNOSIS — R2689 Other abnormalities of gait and mobility: Secondary | ICD-10-CM | POA: Diagnosis not present

## 2021-07-10 NOTE — Therapy (Signed)
[facility] [address] [place], [place], [address] Phone: [phone]   Fax:  [phone]  Physical Therapy Treatment  Patient Details  Name: Jeff [name] MRN: [id number] Date of Birth: 1950/12/10 Referring Provider (PT): Eulogio Bear, DO   Encounter Date: 07/10/2021   PT End of Session - 07/10/21 1220     Visit Number 22    Number of Visits 27   15 visits authorized by [facility]   Date for PT Re-Evaluation 07/14/21    Authorization Type VA - 15 visits    Authorization Time Period additional auth 05-10-21 - 09-07-21    Authorization - Visit Number 10    Authorization - Number of Visits 15    PT Start Time 1100    PT Stop Time 6160    PT Time Calculation (min) 45 min    Equipment Utilized During Treatment --   aquatic weights, pool noodle, single bar bells   Activity Tolerance Patient tolerated treatment well    Behavior During Therapy WFL for tasks assessed/performed             Past Medical History:  Diagnosis Date   ADHD (attention deficit hyperactivity disorder)    Diabetes mellitus    Hyperlipidemia    Hypertension    Renal disorder    Stroke [facility])     Past Surgical History:  Procedure Laterality Date   IR ANGIO INTRA EXTRACRAN SEL COM CAROTID INNOMINATE BILAT MOD SED  01/03/2021   IR ANGIO VERTEBRAL SEL SUBCLAVIAN INNOMINATE UNI L MOD SED  01/03/2021   IR ANGIO VERTEBRAL SEL VERTEBRAL UNI R MOD SED  01/03/2021   IR RADIOLOGIST EVAL & MGMT  01/13/2021   IR US GUIDE VASC ACCESS RIGHT  01/03/2021   NECK SURGERY     SPINE SURGERY     Cervical spine x 2; Hardin Negus; Critzer.    There were no vitals filed for this visit.   Subjective Assessment - 07/10/21 1218     Subjective No new complaints. Reports he felt really good after last pool session with ending in the hot water for leg exercises. No falls or pain to report.    Patient is accompained by: Family member   spouse Jeff [name]   Pertinent History h/o prior CVA  (08-08-18):  h/o cervical fusion C5-7 (2012): h/o neck injury 2016: DM type II, HTN,  stage 3 CKD    Diagnostic tests MRI and MRA on 01-01-21    Patient Stated Goals Improve balance; walk without falling    Currently in Pain? No/denies    Pain Score 0-No pain             Aquatic therapy at [facility] - pool temp 89 degrees   Patient seen for aquatic therapy today.  Treatment took place in water 3.5-4.5 feet deep depending upon activity.  Pt entered and  exited pool via stairs with bil rails, step to pattern.  Pt used pool wall to walk from steps to bench in water Seated at edge of bench for hamstring stretches for 30 sec's x 2 reps each side.  Gait with no device, min guard assist from bench to ~4.3-4.5 water depth Forward "power walk"  with emphasis arm swing with elbows bent and large steps for 4 laps across pool and back, cues on technique Backward gait for 4 laps across pool and back with emphasis on tall posture and step length Side stepping left<>right for 4 laps across pool and back with cues on  posture (keep feet/pelvis forward facing) and step lenght  With yellow pool noodle in 4.3 to 4.5 foot water depth High knee marching forward across pool and back for 4 laps with min guard assist, cues on high knee Tandem gait forward across pool and back with min assist, pt using target to "spot" to assist with staying in straight line  No device for static balance in 4.5 foot water Wide base of support for EC 30 sec's x 3 reps with min guard to min assist, increased posterior postural sway noted.   Transitioned into heated water of Jacuzzi/pool- use of steps with bil rails to enter/exit Seated on bench for sit<>stands for 2 sets of 10 reps with use of water jets on to create increased current to challenge balance, min guard to min assist for balance in standing.     Pt requires buoyancy of water for support for reduced fall risk with gait training and balance exercises with minimal UE  support; exercises able to be performed safely in water without the risk of fall compared to those same exercises performed on land;  viscosity of water needed for resistance for strengthening.  Current of water provides perturbations for challenging static & dynamic standing balance.              PT Short Term Goals - 07/06/21 1840       PT SHORT TERM GOAL #1   Title Improve Berg score from 42/56 to >/= 45/56 for reduced fall risk.    Baseline 03/21/21: 48/56 scored today;  05-18-21 42/56;   06-19-21   45/56    Time 4    Period Weeks    Status Achieved    Target Date 06/16/21      PT SHORT TERM GOAL #2   Title Improve TUG score from 19.03 secs to </= 16.5 secs with no device for decr. fall risk.    Baseline 12.00 secs without device on 03-14-21; 19 secs on 05-18-21;    11.66 secs without device on 06-19-21    Time 4    Period Weeks    Status Achieved    Target Date 06/16/21      PT SHORT TERM GOAL #3   Title Incr. gait velocity from 2.22 ft/sec to >/= 2.6 ft/sec for incr. gait efficiency (without device).    Baseline 10.81 secs = 3.03 ft/sec without device - 03-14-21; 14.78 secs = 2.22 ft/sec on 05-18-21;    11.41 secs = 2.87 ft/sec without device    Time 4    Period Weeks    Status Achieved    Target Date 06/16/21      PT SHORT TERM GOAL #4   Title Amb. 350' nonstop without device with SBA on flat, even surface to demo improved endurance.    Baseline 115' on 05-18-21;  350' with SBA on flat, even surface- 06-19-21 - pt needed to stop after amb. approx. 290' due to increased trunk lean and slight shuffling of Rt foot    Time 4    Period Weeks    Status Achieved    Target Date 06/16/21      PT SHORT TERM GOAL #5   Title Independent in HEP for balance & strengthening exs.               PT Long Term Goals - 07/06/21 1840       PT LONG TERM GOAL #1   Title Increase Berg score to >/= 48/56 for reduced fall risk;  Baseline 33/56 on 02-13-21; score 42/56 on  05-18-21    Time 8    Period Weeks    Status New    Target Date 07/14/21      PT LONG TERM GOAL #2   Title Improve TUG score from 19.03 secs to </= 15 secs without device to reduce fall risk.    Baseline 28.12 secs without device;  19.03 secs with bil. UE support on 05-18-21    Time 8    Period Weeks    Status New    Target Date 07/14/21      PT LONG TERM GOAL #3   Title Pt will amb. 500' with SPC on even and uneven surfaces with supervision for incr. community accessibility.    Baseline 115' with no device at end of session - stopped due to fatigue - 05-18-21    Time 8    Period Weeks    Status On-going    Target Date 07/14/21      PT LONG TERM GOAL #4   Title Perform sit to stand from mat table without UE support to demo improved LE strength. UPDATED - perform sit to stand 10 times without UE support without LOB upon initial standing to demo improved LE strength.    Baseline met 05-18-21    Time 8    Period Weeks    Status New    Target Date 07/14/21      PT LONG TERM GOAL #5   Title Independent in updated HEP for balance and strengthening.    Baseline met 05-18-21 but pt is not compliant with HEP at home    Time 8    Period Weeks    Status On-going    Target Date 07/14/21      PT LONG TERM GOAL #6   Title Increase Foto score from 53/100 to >/= 60/100 to demo improvement in functional status.    Baseline 53/100 on 02-13-21    Time 8    Period Weeks    Status On-going    Target Date 07/14/21      PT LONG TERM GOAL #7   Title Negotiate 4 steps with 1 hand rail using a step over step sequence.    Time 8    Period Weeks    Status New    Target Date 07/14/21                   Plan - 07/10/21 1221     Clinical Impression Statement Today's skilled session continued to focus on strengthening, gait and balance in the aquatic setting with no issues noted. The pt is progressing toward goals and should benefit from continued PT to progress toward unmet goals.     Personal Factors and Comorbidities Age;Fitness;Comorbidity 2;Transportation;Past/Current Experience    Comorbidities h/o cervical fusion C5-7 (2012), h/o neck injury 2016, h/o prior CVA in March 2020, DM type II, HTN, stage 3 CKD    Examination-Activity Limitations Locomotion Level;Transfers;Bend;Stairs;Stand;Lift;Squat    Examination-Participation Restrictions Church;Cleaning;Meal Prep;Driving;Community Activity;Interpersonal Relationship;Laundry;Shop;Yard Work    [employer]chant [employer] officer Evolving/Moderate complexity    Rehab Potential Good    PT Frequency 2x / week    PT Duration 8 weeks    PT Treatment/Interventions ADLs/Self Care Home Management;Aquatic Therapy;Gait training;Stair training;Therapeutic activities;Therapeutic exercise;Balance training;DME Instruction;Neuromuscular re-education;Patient/family education    PT Next Visit Plan [facility] APPTs  ; cont balance and gait training - without device in clinic.  Continue aquatic training: gait and balance, tandem gait, activities with  EC    PT Home Exercise Plan balance and sit to stand    Consulted and Agree with Plan of Care Patient;Family member/caregiver    Family Member Consulted Jeff [name]             Patient will benefit from skilled therapeutic intervention in order to improve the following deficits and impairments:  Decreased balance, Decreased endurance, Decreased strength, Difficulty walking, Pain, Postural dysfunction  Visit Diagnosis: Other abnormalities of gait and mobility  Unsteadiness on feet  Muscle weakness (generalized)     Problem List Patient Active Problem List   Diagnosis Date Noted   TIA (transient ischemic attack) 03/10/2020   CKD (chronic kidney disease) 08/28/2018   Retinopathy of both eyes 08/28/2018   Stroke due to embolism of left vertebral artery ([place]) 08/12/2018   Stroke ([place]) 08/08/2018   Diabetes mellitus ([place]) 07/25/2015   Neck injury 06/22/2015   Hearing loss in right ear  10/14/2011   Agent orange exposure 10/14/2011   ADD (attention deficit disorder) 10/14/2011   Hyperlipidemia 10/14/2011   BMI 28.0-28.9,adult 10/14/2011   Willow Ora, PTA, [facility] [address] [place], [place] [address] [phone] 07/10/21, 12:53 PM   Name: Jeff [name] MRN: [id number] Date of Birth: Oct 15, 1950

## 2021-07-13 ENCOUNTER — Ambulatory Visit: Payer: No Typology Code available for payment source | Admitting: Physical Therapy

## 2021-07-13 ENCOUNTER — Other Ambulatory Visit: Payer: Self-pay

## 2021-07-13 DIAGNOSIS — M6281 Muscle weakness (generalized): Secondary | ICD-10-CM

## 2021-07-13 DIAGNOSIS — R2689 Other abnormalities of gait and mobility: Secondary | ICD-10-CM

## 2021-07-13 DIAGNOSIS — R2681 Unsteadiness on feet: Secondary | ICD-10-CM

## 2021-07-13 DIAGNOSIS — I69351 Hemiplegia and hemiparesis following cerebral infarction affecting right dominant side: Secondary | ICD-10-CM

## 2021-07-14 ENCOUNTER — Encounter (INDEPENDENT_AMBULATORY_CARE_PROVIDER_SITE_OTHER): Payer: Medicare PPO | Admitting: Ophthalmology

## 2021-07-14 DIAGNOSIS — E113312 Type 2 diabetes mellitus with moderate nonproliferative diabetic retinopathy with macular edema, left eye: Secondary | ICD-10-CM | POA: Diagnosis not present

## 2021-07-14 NOTE — Therapy (Signed)
[facility] [address] [place], [place], [address] Phone: [phone]   Fax:  [phone]  Physical Therapy Treatment  Patient Details  Name: Jeff [name] MRN: [id number] Date of Birth: 1950/09/26 Referring Provider (PT): Eulogio Bear, DO   Encounter Date: 07/13/2021   PT End of Session - 07/13/21 1107     Visit Number 23    Number of Visits 27   15 visits authorized by [facility]   Date for PT Re-Evaluation 08/11/21    Authorization Type VA - 15 visits    Authorization Time Period additional auth 05-10-21 - 09-07-21    Authorization - Visit Number 11    Authorization - Number of Visits 15    PT Start Time 0801    PT Stop Time 6378    PT Time Calculation (min) 45 min    Equipment Utilized During Treatment --   aquatic weights, pool noodle, single bar bells   Activity Tolerance Patient tolerated treatment well    Behavior During Therapy WFL for tasks assessed/performed             Past Medical History:  Diagnosis Date   ADHD (attention deficit hyperactivity disorder)    Diabetes mellitus    Hyperlipidemia    Hypertension    Renal disorder    Stroke [facility])     Past Surgical History:  Procedure Laterality Date   IR ANGIO INTRA EXTRACRAN SEL COM CAROTID INNOMINATE BILAT MOD SED  01/03/2021   IR ANGIO VERTEBRAL SEL SUBCLAVIAN INNOMINATE UNI L MOD SED  01/03/2021   IR ANGIO VERTEBRAL SEL VERTEBRAL UNI R MOD SED  01/03/2021   IR RADIOLOGIST EVAL & MGMT  01/13/2021   IR US GUIDE VASC ACCESS RIGHT  01/03/2021   NECK SURGERY     SPINE SURGERY     Cervical spine x 2; Hardin Negus; Critzer.    There were no vitals filed for this visit.   Subjective Assessment - 07/14/21 0953     Subjective Pt reports he is doing good and feels good - likes ending the pool session in the hot tub because he still gets chilled in the therapy pool    Patient is accompained by: Family member   spouse [name]   Pertinent History h/o prior CVA (08-08-18):   h/o cervical fusion C5-7 (2012): h/o neck injury 2016: DM type II, HTN,  stage 3 CKD    Diagnostic tests MRI and MRA on 01-01-21    Patient Stated Goals Improve balance; walk without falling    Currently in Pain? No/denies                [facility] PT Assessment - 07/14/21 0001       Transfers   Number of Reps 10 reps    Comments no UE support used - performed from high/low mat table - LTG #4 met      Berg Balance Test   Sit to Stand Able to stand without using hands and stabilize independently    Standing Unsupported Able to stand safely 2 minutes    Sitting with Back Unsupported but Feet Supported on Floor or Stool Able to sit safely and securely 2 minutes    Stand to Sit Sits safely with minimal use of hands    Transfers Able to transfer safely, minor use of hands    Standing Unsupported with Eyes Closed Able to stand 10 seconds safely    Standing Unsupported with Feet Together Able to place feet  together independently and stand 1 minute safely    From Standing, Reach Forward with Outstretched Arm Can reach confidently >25 cm (10")    From Standing Position, Pick up Object from [place] to pick up shoe safely and easily    From Standing Position, Turn to Look Behind Over each Shoulder Looks behind one side only/other side shows less weight shift    Turn 360 Degrees Able to turn 360 degrees safely in 4 seconds or less   6.63, 6.79   Standing Unsupported, Alternately Place Feet on Step/Stool Able to stand independently and safely and complete 8 steps in 20 seconds    Standing Unsupported, One Foot in Front Able to plae foot ahead of the other independently and hold 30 seconds    Standing on One Leg Able to lift leg independently and hold equal to or more than 3 seconds   4.68 secs   Total Score 52                           [facility] Adult PT Treatment/Exercise - 07/14/21 0001       Transfers   Transfers Sit to Stand;Stand to Sit    Sit to Stand 5: Supervision     Stand to Sit 5: Supervision      Ambulation/Gait   Ambulation/Gait Assistance 5: Supervision    Ambulation/Gait Assistance Details pt had tendency to lean forward (trunk flexion) and slightly drag RLE with fatigue - occurred after approx. 400' amb. - pt was cued to stop and "reset" - cues to stand more upright and to lift RLE with attempting increased Rt initial heel contact at stance    Ambulation Distance (Feet) 500 Feet    Assistive device None    Gait Pattern Step-through pattern    Ambulation Surface Level;Indoor    Stairs Yes    Stairs Assistance 5: Supervision    Stair Management Technique One rail Right;Alternating pattern;Forwards    Number of Stairs 4    Height of Stairs 6      Timed Up and Go Test   TUG Normal TUG    Normal TUG (seconds) 10.59   no device     Knee/Hip Exercises: Standing   Heel Raises Both;1 set;10 reps    Hip Flexion Stengthening;Right;10 reps;Knee straight;1 set;Left   3# weight used   Hip Abduction Stengthening;Right;1 set;10 reps;Left   3# weight used   Hip Extension Stengthening;Right;Left;1 set;10 reps;Knee straight   3# weight used   Forward Step Up Right;Left;1 set;10 reps;Hand Hold: 1;Step Height: 6"                       PT Short Term Goals - 07/14/21 0958       PT SHORT TERM GOAL #1   Title Improve Berg score from 42/56 to >/= 45/56 for reduced fall risk.    Baseline 03/21/21: 48/56 scored today;  05-18-21 42/56;   06-19-21   45/56    Time 4    Period Weeks    Status Achieved    Target Date 06/16/21      PT SHORT TERM GOAL #2   Title Improve TUG score from 19.03 secs to </= 16.5 secs with no device for decr. fall risk.    Baseline 12.00 secs without device on 03-14-21; 19 secs on 05-18-21;    11.66 secs without device on 06-19-21    Time 4    Period Weeks  Status Achieved    Target Date 06/16/21      PT SHORT TERM GOAL #3   Title Incr. gait velocity from 2.22 ft/sec to >/= 2.6 ft/sec for incr. gait efficiency (without  device).    Baseline 10.81 secs = 3.03 ft/sec without device - 03-14-21; 14.78 secs = 2.22 ft/sec on 05-18-21;    11.41 secs = 2.87 ft/sec without device    Time 4    Period Weeks    Status Achieved    Target Date 06/16/21      PT SHORT TERM GOAL #4   Title Amb. 350' nonstop without device with SBA on flat, even surface to demo improved endurance.    Baseline 115' on 05-18-21;  350' with SBA on flat, even surface- 06-19-21 - pt needed to stop after amb. approx. 290' due to increased trunk lean and slight shuffling of Rt foot    Time 4    Period Weeks    Status Achieved    Target Date 06/16/21      PT SHORT TERM GOAL #5   Title Independent in HEP for balance & strengthening exs.               PT Long Term Goals - 07/13/21 0809       PT LONG TERM GOAL #1   Title Increase Berg score to >/= 48/56 for reduced fall risk;  UPGRADED LTG - increase Berg score to >/= 54/56 to demo improved balance.    Baseline 33/56 on 02-13-21; score 42/56 on 05-18-21;   52/56 on 07-13-21    Time 8    Period Weeks    Status Revised    Target Date 08/11/21      PT LONG TERM GOAL #2   Title Improve TUG score from 19.03 secs to </= 15 secs without device to reduce fall risk.    Baseline 28.12 secs without device;  19.03 secs with bil. UE support on 05-18-21;  10.59 secs on 07-13-21 with no device    Time 8    Period Weeks    Status Achieved    Target Date 07/14/21      PT LONG TERM GOAL #3   Title Pt will amb. 500' with SPC on even and uneven surfaces with supervision for incr. community accessibility.    Baseline 115' with no device at end of session - stopped due to fatigue - 05-18-21;  07-13-21;  500' without cane without SPC    Time 8    Period Weeks    Status Achieved    Target Date 07/14/21      PT LONG TERM GOAL #4   Title Perform sit to stand from mat table without UE support to demo improved LE strength. UPDATED - perform sit to stand 10 times without UE support without LOB upon initial  standing to demo improved LE strength.    Baseline met 05-18-21 ;  updated goal met 07-13-21    Time 8    Period Weeks    Status Achieved    Target Date 07/14/21      PT LONG TERM GOAL #5   Title Independent in updated HEP for balance and strengthening.  Revised LTG; independent in updated HEP to include aquatic exercises if pt chooses to join community facility with a pool after D/C from PT.    Baseline met 05-18-21 but pt is not compliant with HEP at home    Time 8    Period Weeks  Status Revised    Target Date 08/11/21      Additional Long Term Goals   Additional Long Term Goals Yes      PT LONG TERM GOAL #6   Title Increase Foto score from 53/100 to >/= 60/100 to demo improvement in functional status.    Baseline 53/100 on 02-13-21    Time 8    Period Weeks    Status On-going    Target Date 08/11/21      PT LONG TERM GOAL #7   Title Negotiate 4 steps with 1 hand rail using a step over step sequence.    Time 8    Period Weeks    Status Achieved    Target Date 07/14/21      PT LONG TERM GOAL #8   Title Amb. 5" nonstop in pool to demo increased endurance/activity tolerance.    Time 4    Period Weeks    Status New    Target Date 08/11/21                   Plan - 07/14/21 1023     Clinical Impression Statement Session focused on assessment of LTG's for completion of recertification (pt has 4 remaining authorized visits through [place]); pt has met all LTG's  with goal #6 (FOTO) ongoing as it will be completed at D/C.  Berg score has increased from 42/56 to 52/56 and TUG score has increased from 28 secs at initial eval to 10.59 secs without device in today's session.  Pt amb. 500' without device in clinic gym but did get fatigued after approx. 400' with increased gait deviations occurring.  Pt will continue to benefit from PT to address gait and balance deficits and RLE weakness.    Personal Factors and Comorbidities Age;Fitness;Comorbidity 2;Transportation;Past/Current  Experience    Comorbidities h/o cervical fusion C5-7 (2012), h/o neck injury 2016, h/o prior CVA in March 2020, DM type II, HTN, stage 3 CKD    Examination-Activity Limitations Locomotion Level;Transfers;Bend;Stairs;Stand;Lift;Squat    Examination-Participation Restrictions Church;Cleaning;Meal Prep;Driving;Community Activity;Interpersonal Relationship;Laundry;Shop;Yard Work    Merchant [employer] officer Evolving/Moderate complexity    Rehab Potential Good    PT Frequency 2x / week    PT Duration 4 weeks    PT Treatment/Interventions ADLs/Self Care Home Management;Aquatic Therapy;Gait training;Stair training;Therapeutic activities;Therapeutic exercise;Balance training;DME Instruction;Neuromuscular re-education;Patient/family education    PT Next Visit Plan [facility] APPTs  ; cont balance and gait training - without device in clinic.  Continue aquatic training: gait and balance, tandem gait, activities with EC    PT Home Exercise Plan balance and sit to stand    Consulted and Agree with Plan of Care Patient;Family member/caregiver    Family Member Consulted [name]             Patient will benefit from skilled therapeutic intervention in order to improve the following deficits and impairments:  Decreased balance, Decreased endurance, Decreased strength, Difficulty walking, Pain, Postural dysfunction  Visit Diagnosis: Other abnormalities of gait and mobility - Plan: PT plan of care cert/re-cert  Unsteadiness on feet - Plan: PT plan of care cert/re-cert  Muscle weakness (generalized) - Plan: PT plan of care cert/re-cert  Hemiplegia and hemiparesis following cerebral infarction affecting right dominant side ([place]) - Plan: PT plan of care cert/re-cert     Problem List Patient Active Problem List   Diagnosis Date Noted   TIA (transient ischemic attack) 03/10/2020   CKD (chronic kidney disease) 08/28/2018   Retinopathy of both eyes 08/28/2018  Stroke due to embolism of left  vertebral artery ([place]) 08/12/2018   Stroke ([place]) 08/08/2018   Diabetes mellitus ([place]) 07/25/2015   Neck injury 06/22/2015   Hearing loss in right ear 10/14/2011   Agent orange exposure 10/14/2011   ADD (attention deficit disorder) 10/14/2011   Hyperlipidemia 10/14/2011   BMI 28.0-28.9,adult 10/14/2011    Tatem Holsonback, [place], PT 07/14/2021, 10:34 AM  [facility] [address] [place], [place], [address] Phone: [phone]   Fax:  [phone]  Name: Jeff [name] MRN: [id number] Date of Birth: Sep 04, 1950

## 2021-07-17 ENCOUNTER — Encounter: Payer: Self-pay | Admitting: Physical Therapy

## 2021-07-17 ENCOUNTER — Ambulatory Visit: Payer: No Typology Code available for payment source | Admitting: Physical Therapy

## 2021-07-17 ENCOUNTER — Other Ambulatory Visit: Payer: Self-pay

## 2021-07-17 DIAGNOSIS — M6281 Muscle weakness (generalized): Secondary | ICD-10-CM

## 2021-07-17 DIAGNOSIS — R2689 Other abnormalities of gait and mobility: Secondary | ICD-10-CM

## 2021-07-17 DIAGNOSIS — R2681 Unsteadiness on feet: Secondary | ICD-10-CM

## 2021-07-17 NOTE — Therapy (Addendum)
[facility] [address] [name], [place], [address] Phone: [phone]   Fax:  [phone]  Physical Therapy Treatment  Patient Details  Name: Jeff [name] MRN: [id number] Date of Birth: June 23, 1950 Referring Provider (PT): Eulogio Bear, DO   Encounter Date: 07/17/2021   PT End of Session - 07/17/21 9735     Visit Number 24    Number of Visits 27   15 visits authorized by [facility]   Date for PT Re-Evaluation 08/11/21    Authorization Type VA - 15 visits    Authorization Time Period additional auth 05-10-21 - 09-07-21    Authorization - Visit Number 12    Authorization - Number of Visits 15    PT Start Time 3299    PT Stop Time 2426    PT Time Calculation (min) 41 min    Equipment Utilized During Treatment --   aquatic weights, pool noodle, single bar bells   Activity Tolerance Patient tolerated treatment well    Behavior During Therapy WFL for tasks assessed/performed             Past Medical History:  Diagnosis Date   ADHD (attention deficit hyperactivity disorder)    Diabetes mellitus    Hyperlipidemia    Hypertension    Renal disorder    Stroke [facility])     Past Surgical History:  Procedure Laterality Date   IR ANGIO INTRA EXTRACRAN SEL COM CAROTID INNOMINATE BILAT MOD SED  01/03/2021   IR ANGIO VERTEBRAL SEL SUBCLAVIAN INNOMINATE UNI L MOD SED  01/03/2021   IR ANGIO VERTEBRAL SEL VERTEBRAL UNI R MOD SED  01/03/2021   IR RADIOLOGIST EVAL & MGMT  01/13/2021   IR US GUIDE VASC ACCESS RIGHT  01/03/2021   NECK SURGERY     SPINE SURGERY     Cervical spine x 2; Hardin Negus; Critzer.    There were no vitals filed for this visit.   Subjective Assessment - 07/17/21 1832     Subjective No new complaints. No falls or pain to report.    Patient is accompained by: Family member   spouse Jeff [name]   Pertinent History h/o prior CVA (08-08-18):  h/o cervical fusion C5-7 (2012): h/o neck injury 2016: DM type II, HTN,  stage 3 CKD     Limitations Walking;House hold activities;Lifting    Diagnostic tests MRI and MRA on 01-01-21    Patient Stated Goals Improve balance; walk without falling    Currently in Pain? No/denies    Pain Score 0-No pain             Aquatic therapy at [facility] - pool temp 90 degrees   Patient seen for aquatic therapy today.  Treatment took place in water 3.5-4.5 feet deep depending upon activity.  Pt entered and exited pill via steps with bil rails, step to pattern.   Supervision for gait into water depth of ~4.3-4.5 foot deep Forward power walking with emphasis on large steps and arm swing with elbow flexion of 90* for 18 feet across pool/back x 4 laps Backward walking with emphasis on tall posture and large steps for 18 feet across pool/back for 4 laps Side stepping with simultaneous arm movements for 18 feet across pool/back for 4 laps  Working on balance in ~4.3-4.5 foot depth water with min guard to occasional min assist for balance With yellow noodile-  high knee marching for 18 feet across pool/back for 4 laps working to "tap" noodle with knee each time  Started tandem gait for the 18 feet across pool/back with noodle  for 2 laps, then progressing to no UE support or device for 2 more laps. Pt using target on pool side to assist with staying straight.  With single bar bells- min guard to min assist for balance Forward walking with alternating punching at water level for 18 feet across pool/back for 4 laps Side stepping with shoulder abd/add holding bar bells under water (arms out with stepping out<>arms in with stepping feet back together) for 18 feet across pool/back for 4 laps each way  For strengthening performed the following: Seated on bench in water with 2.5# ankle weights  Long arc quads with emphasis on slow, controlled movements for 20 reps each side Alternating marching for 20 reps each side with emphasis on slow, controlled movements With LE"s out in extension floating in  water for hip abd/add for 20 reps Lower abdominal strengthening with LE' movements as follows: with feet on pool floor lifting knees up to chest<>extending LE's into full extension out into water<>pulling knees back to chest<>lowering feet back to pool floor for 10 reps  Seated on bench in water with no weights Sit to stands: With feet in parallel position for 10 reps no UE support With feet in staggered stance for 10 reps each foot forward with limited UE assist     Pt requires buoyancy of water for support for reduced fall risk with gait training and balance exercises with minimal UE support; exercises able to be performed safely in water without the risk of fall compared to those same exercises performed on land;  viscosity of water needed for resistance for strengthening.  Current of water provides perturbations for challenging static & dynamic standing balance.                                PT Short Term Goals - 07/14/21 0958       PT SHORT TERM GOAL #1   Title Improve Berg score from 42/56 to >/= 45/56 for reduced fall risk.    Baseline 03/21/21: 48/56 scored today;  05-18-21 42/56;   06-19-21   45/56    Time 4    Period Weeks    Status Achieved    Target Date 06/16/21      PT SHORT TERM GOAL #2   Title Improve TUG score from 19.03 secs to </= 16.5 secs with no device for decr. fall risk.    Baseline 12.00 secs without device on 03-14-21; 19 secs on 05-18-21;    11.66 secs without device on 06-19-21    Time 4    Period Weeks    Status Achieved    Target Date 06/16/21      PT SHORT TERM GOAL #3   Title Incr. gait velocity from 2.22 ft/sec to >/= 2.6 ft/sec for incr. gait efficiency (without device).    Baseline 10.81 secs = 3.03 ft/sec without device - 03-14-21; 14.78 secs = 2.22 ft/sec on 05-18-21;    11.41 secs = 2.87 ft/sec without device    Time 4    Period Weeks    Status Achieved    Target Date 06/16/21      PT SHORT TERM GOAL #4   Title  Amb. 350' nonstop without device with SBA on flat, even surface to demo improved endurance.    Baseline 115' on 05-18-21;  350' with SBA on flat, even surface- 06-19-21 -  pt needed to stop after amb. approx. 290' due to increased trunk lean and slight shuffling of Rt foot    Time 4    Period Weeks    Status Achieved    Target Date 06/16/21      PT SHORT TERM GOAL #5   Title Independent in HEP for balance & strengthening exs.               PT Long Term Goals - 07/13/21 0809       PT LONG TERM GOAL #1   Title Increase Berg score to >/= 48/56 for reduced fall risk;  UPGRADED LTG - increase Berg score to >/= 54/56 to demo improved balance.    Baseline 33/56 on 02-13-21; score 42/56 on 05-18-21;   52/56 on 07-13-21    Time 8    Period Weeks    Status Revised    Target Date 08/11/21      PT LONG TERM GOAL #2   Title Improve TUG score from 19.03 secs to </= 15 secs without device to reduce fall risk.    Baseline 28.12 secs without device;  19.03 secs with bil. UE support on 05-18-21;  10.59 secs on 07-13-21 with no device    Time 8    Period Weeks    Status Achieved    Target Date 07/14/21      PT LONG TERM GOAL #3   Title Pt will amb. 500' with SPC on even and uneven surfaces with supervision for incr. community accessibility.    Baseline 115' with no device at end of session - stopped due to fatigue - 05-18-21;  07-13-21;  500' without cane without SPC    Time 8    Period Weeks    Status Achieved    Target Date 07/14/21      PT LONG TERM GOAL #4   Title Perform sit to stand from mat table without UE support to demo improved LE strength. UPDATED - perform sit to stand 10 times without UE support without LOB upon initial standing to demo improved LE strength.    Baseline met 05-18-21 ;  updated goal met 07-13-21    Time 8    Period Weeks    Status Achieved    Target Date 07/14/21      PT LONG TERM GOAL #5   Title Independent in updated HEP for balance and strengthening.  Revised  LTG; independent in updated HEP to include aquatic exercises if pt chooses to join community facility with a pool after D/C from PT.    Baseline met 05-18-21 but pt is not compliant with HEP at home    Time 8    Period Weeks    Status Revised    Target Date 08/11/21      Additional Long Term Goals   Additional Long Term Goals Yes      PT LONG TERM GOAL #6   Title Increase Foto score from 53/100 to >/= 60/100 to demo improvement in functional status.    Baseline 53/100 on 02-13-21    Time 8    Period Weeks    Status On-going    Target Date 08/11/21      PT LONG TERM GOAL #7   Title Negotiate 4 steps with 1 hand rail using a step over step sequence.    Time 8    Period Weeks    Status Achieved    Target Date 07/14/21      PT LONG  TERM GOAL #8   Title Amb. 5" nonstop in pool to demo increased endurance/activity tolerance.    Time 4    Period Weeks    Status New    Target Date 08/11/21                   Plan - 07/17/21 1834     Clinical Impression Statement Today's skilled session continued to focus on strengthening, gait and balance in the aquatic setting with no issues noted or reported by patient. The pt is making steady progress toward goals and should benefit from continued PT to progress toward unmet goals.    Personal Factors and Comorbidities Age;Fitness;Comorbidity 2;Transportation;Past/Current Experience    Comorbidities h/o cervical fusion C5-7 (2012), h/o neck injury 2016, h/o prior CVA in March 2020, DM type II, HTN, stage 3 CKD    Examination-Activity Limitations Locomotion Level;Transfers;Bend;Stairs;Stand;Lift;Squat    Examination-Participation Restrictions Church;Cleaning;Meal Prep;Driving;Community Activity;Interpersonal Relationship;Laundry;Shop;Yard Work    [employer] officer Evolving/Moderate complexity    Rehab Potential Good    PT Frequency 2x / week    PT Duration 4 weeks    PT Treatment/Interventions ADLs/Self Care Home  Management;Aquatic Therapy;Gait training;Stair training;Therapeutic activities;Therapeutic exercise;Balance training;DME Instruction;Neuromuscular re-education;Patient/family education    PT Next Visit Plan [facility] APPTs  ; cont balance and gait training - without device in clinic.  Continue aquatic training: gait and balance, tandem gait, activities with EC    PT Home Exercise Plan balance and sit to stand    Consulted and Agree with Plan of Care Patient;Family member/caregiver    Family Member Consulted Jeff [name]             Patient will benefit from skilled therapeutic intervention in order to improve the following deficits and impairments:  Decreased balance, Decreased endurance, Decreased strength, Difficulty walking, Pain, Postural dysfunction  Visit Diagnosis: Other abnormalities of gait and mobility  Unsteadiness on feet  Muscle weakness (generalized)     Problem List Patient Active Problem List   Diagnosis Date Noted   TIA (transient ischemic attack) 03/10/2020   CKD (chronic kidney disease) 08/28/2018   Retinopathy of both eyes 08/28/2018   Stroke due to embolism of left vertebral artery ([place]) 08/12/2018   Stroke ([place]) 08/08/2018   Diabetes mellitus ([place]) 07/25/2015   Neck injury 06/22/2015   Hearing loss in right ear 10/14/2011   Agent orange exposure 10/14/2011   ADD (attention deficit disorder) 10/14/2011   Hyperlipidemia 10/14/2011   BMI 28.0-28.9,adult 10/14/2011    Willow Ora, PTA 07/17/2021, 8:28 PM  [facility] [address] [place], [place], [address] Phone: [phone]   Fax:  [phone]  Name: Jeff [name] MRN: [id number] Date of Birth: 04-09-1951

## 2021-07-20 ENCOUNTER — Ambulatory Visit: Payer: No Typology Code available for payment source | Admitting: Physical Therapy

## 2021-07-24 ENCOUNTER — Other Ambulatory Visit: Payer: Self-pay

## 2021-07-24 ENCOUNTER — Ambulatory Visit: Payer: No Typology Code available for payment source | Admitting: Physical Therapy

## 2021-07-24 ENCOUNTER — Encounter: Payer: Self-pay | Admitting: Physical Therapy

## 2021-07-24 DIAGNOSIS — M6281 Muscle weakness (generalized): Secondary | ICD-10-CM

## 2021-07-24 DIAGNOSIS — I69351 Hemiplegia and hemiparesis following cerebral infarction affecting right dominant side: Secondary | ICD-10-CM

## 2021-07-24 DIAGNOSIS — R2681 Unsteadiness on feet: Secondary | ICD-10-CM

## 2021-07-24 DIAGNOSIS — R2689 Other abnormalities of gait and mobility: Secondary | ICD-10-CM | POA: Diagnosis not present

## 2021-07-24 NOTE — Therapy (Signed)
[facility] [address] [place], [place], [address] Phone: [phone]   Fax:  [phone]  Physical Therapy Treatment  Patient Details  Name: Jeff [name] MRN: [id number] Date of Birth: 06-10-1950 Referring Provider (PT): Eulogio Bear, DO   Encounter Date: 07/24/2021   PT End of Session - 07/24/21 2026     Visit Number 25    Number of Visits 27   15 visits authorized by [facility]   Date for PT Re-Evaluation 08/11/21    Authorization Type VA - 15 visits    Authorization Time Period additional auth 05-10-21 - 09-07-21    Authorization - Visit Number 13    Authorization - Number of Visits 15    PT Start Time 1100    PT Stop Time 7494    PT Time Calculation (min) 42 min    Equipment Utilized During Treatment Other (comment)   aquatic weights, pool noodle, single bar bells   Activity Tolerance Patient tolerated treatment well    Behavior During Therapy WFL for tasks assessed/performed             Past Medical History:  Diagnosis Date   ADHD (attention deficit hyperactivity disorder)    Diabetes mellitus    Hyperlipidemia    Hypertension    Renal disorder    Stroke [facility])     Past Surgical History:  Procedure Laterality Date   IR ANGIO INTRA EXTRACRAN SEL COM CAROTID INNOMINATE BILAT MOD SED  01/03/2021   IR ANGIO VERTEBRAL SEL SUBCLAVIAN INNOMINATE UNI L MOD SED  01/03/2021   IR ANGIO VERTEBRAL SEL VERTEBRAL UNI R MOD SED  01/03/2021   IR RADIOLOGIST EVAL & MGMT  01/13/2021   IR US GUIDE VASC ACCESS RIGHT  01/03/2021   NECK SURGERY     SPINE SURGERY     Cervical spine x 2; Hardin Negus; Critzer.    There were no vitals filed for this visit.   Subjective Assessment - 07/24/21 2025     Subjective No new complaints. No falls or pain to report. Had a good trip over the weekend to the beach and was able to do a lot of walking.    Patient is accompained by: Family member   spouse [name]   Pertinent History h/o prior CVA  (08-08-18):  h/o cervical fusion C5-7 (2012): h/o neck injury 2016: DM type II, HTN,  stage 3 CKD    Limitations Walking;House hold activities;Lifting    Diagnostic tests MRI and MRA on 01-01-21    Patient Stated Goals Improve balance; walk without falling    Currently in Pain? No/denies    Pain Score 0-No pain            Aquatic therapy at [facility] - pool temp 90 degrees   Patient seen for aquatic therapy today.  Treatment took place in water 3.5-4.5 feet deep depending upon activity.  Pt entered and exited pool via steps with bil rails, step to pattern.   Supervision for gait into ~4.3-4.5 foot depth water Forward power walking with emphasis on arm swing and large steps for 18 feet across/then back for 5 laps Back walking with emphasis on tall posture Sies tepping with arm ovmenets With yellow pool noodle- high knee marching, tadndme in bloc practice At pool wall: hip abd, hip ext x 15 reps each, back to wall for up<>hip abd/add<>back down alternaring for 10 reps  For balance: With single bar balls Shoulder abd/add at water level, shoulder adduction/abduction, cross counter  ski  No UE support Alter fwd stepping with reaching forward to scoop water<>back Alter bwd stepping with reaching back with back arch<>bac to stance  Seated at bench with 2.5 pound weights Long arc quads x 20 Hip abd/add x 20 reps    Pt requires buoyancy of water for support for reduced fall risk with gait training and balance exercises with minimal UE support; exercises able to be performed safely in water without the risk of fall compared to those same exercises performed on land;  viscosity of water needed for resistance for strengthening.  Current of water provides perturbations for challenging static & dynamic standing balance.                                 PT Short Term Goals - 07/14/21 0958       PT SHORT TERM GOAL #1   Title Improve Berg score from 42/56 to >/=  45/56 for reduced fall risk.    Baseline 03/21/21: 48/56 scored today;  05-18-21 42/56;   06-19-21   45/56    Time 4    Period Weeks    Status Achieved    Target Date 06/16/21      PT SHORT TERM GOAL #2   Title Improve TUG score from 19.03 secs to </= 16.5 secs with no device for decr. fall risk.    Baseline 12.00 secs without device on 03-14-21; 19 secs on 05-18-21;    11.66 secs without device on 06-19-21    Time 4    Period Weeks    Status Achieved    Target Date 06/16/21      PT SHORT TERM GOAL #3   Title Incr. gait velocity from 2.22 ft/sec to >/= 2.6 ft/sec for incr. gait efficiency (without device).    Baseline 10.81 secs = 3.03 ft/sec without device - 03-14-21; 14.78 secs = 2.22 ft/sec on 05-18-21;    11.41 secs = 2.87 ft/sec without device    Time 4    Period Weeks    Status Achieved    Target Date 06/16/21      PT SHORT TERM GOAL #4   Title Amb. 350' nonstop without device with SBA on flat, even surface to demo improved endurance.    Baseline 115' on 05-18-21;  350' with SBA on flat, even surface- 06-19-21 - pt needed to stop after amb. approx. 290' due to increased trunk lean and slight shuffling of Rt foot    Time 4    Period Weeks    Status Achieved    Target Date 06/16/21      PT SHORT TERM GOAL #5   Title Independent in HEP for balance & strengthening exs.               PT Long Term Goals - 07/13/21 0809       PT LONG TERM GOAL #1   Title Increase Berg score to >/= 48/56 for reduced fall risk;  UPGRADED LTG - increase Berg score to >/= 54/56 to demo improved balance.    Baseline 33/56 on 02-13-21; score 42/56 on 05-18-21;   52/56 on 07-13-21    Time 8    Period Weeks    Status Revised    Target Date 08/11/21      PT LONG TERM GOAL #2   Title Improve TUG score from 19.03 secs to </= 15 secs without device to reduce fall risk.  Baseline 28.12 secs without device;  19.03 secs with bil. UE support on 05-18-21;  10.59 secs on 07-13-21 with no device    Time  8    Period Weeks    Status Achieved    Target Date 07/14/21      PT LONG TERM GOAL #3   Title Pt will amb. 500' with SPC on even and uneven surfaces with supervision for incr. community accessibility.    Baseline 115' with no device at end of session - stopped due to fatigue - 05-18-21;  07-13-21;  500' without cane without SPC    Time 8    Period Weeks    Status Achieved    Target Date 07/14/21      PT LONG TERM GOAL #4   Title Perform sit to stand from mat table without UE support to demo improved LE strength. UPDATED - perform sit to stand 10 times without UE support without LOB upon initial standing to demo improved LE strength.    Baseline met 05-18-21 ;  updated goal met 07-13-21    Time 8    Period Weeks    Status Achieved    Target Date 07/14/21      PT LONG TERM GOAL #5   Title Independent in updated HEP for balance and strengthening.  Revised LTG; independent in updated HEP to include aquatic exercises if pt chooses to join community facility with a pool after D/C from PT.    Baseline met 05-18-21 but pt is not compliant with HEP at home    Time 8    Period Weeks    Status Revised    Target Date 08/11/21      Additional Long Term Goals   Additional Long Term Goals Yes      PT LONG TERM GOAL #6   Title Increase Foto score from 53/100 to >/= 60/100 to demo improvement in functional status.    Baseline 53/100 on 02-13-21    Time 8    Period Weeks    Status On-going    Target Date 08/11/21      PT LONG TERM GOAL #7   Title Negotiate 4 steps with 1 hand rail using a step over step sequence.    Time 8    Period Weeks    Status Achieved    Target Date 07/14/21      PT LONG TERM GOAL #8   Title Amb. 5" nonstop in pool to demo increased endurance/activity tolerance.    Time 4    Period Weeks    Status New    Target Date 08/11/21                   Plan - 07/24/21 2031     Clinical Impression Statement Today's skilled session continued to focus on  strengthening, balance and gait in the aquatic setting. Pt did have a drop in his blood glucose to the 70's during session. He sat on bench with a snack with blood glucose improving prior to going into locker room to change. No other issues noted or reported in session. The pt is making progress and plan to wrap up after one more pool and then one more land session.    Personal Factors and Comorbidities Age;Fitness;Comorbidity 2;Transportation;Past/Current Experience    Comorbidities h/o cervical fusion C5-7 (2012), h/o neck injury 2016, h/o prior CVA in March 2020, DM type II, HTN, stage 3 CKD    Examination-Activity Limitations Locomotion Level;Transfers;Bend;Stairs;Stand;Lift;Squat  Examination-Participation Restrictions Church;Cleaning;Meal Prep;Driving;Community Activity;Interpersonal Relationship;Laundry;Shop;Yard Work    [employer] officer Evolving/Moderate complexity    Rehab Potential Good    PT Frequency 2x / week    PT Duration 4 weeks    PT Treatment/Interventions ADLs/Self Care Home Management;Aquatic Therapy;Gait training;Stair training;Therapeutic activities;Therapeutic exercise;Balance training;DME Instruction;Neuromuscular re-education;Patient/family education    PT Next Visit Plan pt has 2 more visits- aquatic, then land with anticipated discharge at last land session    PT Home Exercise Plan balance and sit to stand    Consulted and Agree with Plan of Care Patient;Family member/caregiver    Family Member Consulted [name]             Patient will benefit from skilled therapeutic intervention in order to improve the following deficits and impairments:  Decreased balance, Decreased endurance, Decreased strength, Difficulty walking, Pain, Postural dysfunction  Visit Diagnosis: Other abnormalities of gait and mobility  Unsteadiness on feet  Muscle weakness (generalized)  Hemiplegia and hemiparesis following cerebral infarction affecting right dominant side  [facility])     Problem List Patient Active Problem List   Diagnosis Date Noted   TIA (transient ischemic attack) 03/10/2020   CKD (chronic kidney disease) 08/28/2018   Retinopathy of both eyes 08/28/2018   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

## 2021-07-31 ENCOUNTER — Encounter: Payer: Self-pay | Admitting: Physical Therapy

## 2021-07-31 ENCOUNTER — Other Ambulatory Visit: Payer: Self-pay

## 2021-07-31 ENCOUNTER — Ambulatory Visit: Payer: No Typology Code available for payment source | Admitting: Physical Therapy

## 2021-07-31 DIAGNOSIS — I69351 Hemiplegia and hemiparesis following cerebral infarction affecting right dominant side: Secondary | ICD-10-CM

## 2021-07-31 DIAGNOSIS — R2681 Unsteadiness on feet: Secondary | ICD-10-CM

## 2021-07-31 DIAGNOSIS — R293 Abnormal posture: Secondary | ICD-10-CM

## 2021-07-31 DIAGNOSIS — R2689 Other abnormalities of gait and mobility: Secondary | ICD-10-CM

## 2021-07-31 DIAGNOSIS — M6281 Muscle weakness (generalized): Secondary | ICD-10-CM

## 2021-07-31 NOTE — Therapy (Signed)
[facility] [address] [place], [place], [address] Phone: [phone]   Fax:  [phone]  Physical Therapy Treatment  Patient Details  Name: Jeff [name] MRN: [id number] Date of Birth: 1950-06-12 Referring Provider (PT): Eulogio Bear, DO   Encounter Date: 07/31/2021   PT End of Session - 07/31/21 1854     Visit Number 26    Number of Visits 27   15 visits authorized by [facility]   Date for PT Re-Evaluation 08/11/21    Authorization Type VA - 15 visits    Authorization Time Period additional auth 05-10-21 - 09-07-21    Authorization - Visit Number 14    Authorization - Number of Visits 15    PT Start Time 1100    PT Stop Time 1141    PT Time Calculation (min) 41 min    Equipment Utilized During Treatment Other (comment)   aquatic weights, pool noodle, single bar bells, aquatic cuffs   Activity Tolerance Patient tolerated treatment well    Behavior During Therapy WFL for tasks assessed/performed             Past Medical History:  Diagnosis Date   ADHD (attention deficit hyperactivity disorder)    Diabetes mellitus    Hyperlipidemia    Hypertension    Renal disorder    Stroke [facility])     Past Surgical History:  Procedure Laterality Date   IR ANGIO INTRA EXTRACRAN SEL COM CAROTID INNOMINATE BILAT MOD SED  01/03/2021   IR ANGIO VERTEBRAL SEL SUBCLAVIAN INNOMINATE UNI L MOD SED  01/03/2021   IR ANGIO VERTEBRAL SEL VERTEBRAL UNI R MOD SED  01/03/2021   IR RADIOLOGIST EVAL & MGMT  01/13/2021   IR US GUIDE VASC ACCESS RIGHT  01/03/2021   NECK SURGERY     SPINE SURGERY     Cervical spine x 2; Hardin Negus; Critzer.    There were no vitals filed for this visit.   Subjective Assessment - 07/31/21 1853     Subjective No new complaints. No falls or pain to report.    Patient is accompained by: Family member   spouse [name]   Pertinent History h/o prior CVA (08-08-18):  h/o cervical fusion C5-7 (2012): h/o neck injury 2016: DM type  II, HTN,  stage 3 CKD    Limitations Walking;House hold activities;Lifting    Diagnostic tests MRI and MRA on 01-01-21    Patient Stated Goals Improve balance; walk without falling    Currently in Pain? No/denies    Pain Score 0-No pain             Aquatic therapy at [facility] - pool temp 86 degrees   Patient seen for aquatic therapy today.  Treatment took place in water 3.5-4.5 feet deep depending upon activity.  Pt entered and exited the pool via stairs with bil rails, reciprocal pattern with min guard assist for safety.  Headed into deeper water of ~4.3-4.5 foot depth with no AD for the following activities:  Forward power walking with emphasis on large steps and reciprocal arm swings for 18 feet x 8 laps Backward walking with emphasis on posture, step length for 18 feet x 8 laps Side stepping left<>right with arms moving out as pt stepped out<>arms in as pt stepped feet back together for 18 feet for 6 laps each way  With yellow pool noodle: Forward walking with upper trunk rotation left<>right for 18 feet x 6 laps Forward high knee marching while bringing the  noodle down to tap knees each time for 18 feet x 8 laps Tandem stepping for 18 feet for 7 laps with pt using target on pool edge to try and stay straight. Stepping out/stepping strategy used by pt for balance losses.   At pool wall in ~4.3-4.5 water depth: With 2.5 ankle weights to bil LE"s pt performed the following; Alternating slow marching x 15 reps each side Alternating hip side kicks with emphasis on tall posture/not leaning for 15 reps each side Alternating hip back kicks with emphasis on not leaning forward for 15 reps each side With back to wall: lifting LE up to float in water with knee extensions<>moving leg out to side/back in<>brining leg back down for 15 reps each side, alternating sides  Gait over to bench in water:  With aquatic cuffs on bil LE's Slow long arc quads, alternating for 15 reps each side With  LE"s out in knee extension floating in water- hip abductions/adduction for 15 reps, flutter kicks for 15 reps and scissor kicks for 15 reps.      Pt requires buoyancy of water for support for reduced fall risk with gait training and balance exercises with minimal UE support; exercises able to be performed safely in water without the risk of fall compared to those same exercises performed on land;  viscosity of water needed for resistance for strengthening.  Current of water provides perturbations for challenging static & dynamic standing balance.              PT Short Term Goals - 07/14/21 0958       PT SHORT TERM GOAL #1   Title Improve Berg score from 42/56 to >/= 45/56 for reduced fall risk.    Baseline 03/21/21: 48/56 scored today;  05-18-21 42/56;   06-19-21   45/56    Time 4    Period Weeks    Status Achieved    Target Date 06/16/21      PT SHORT TERM GOAL #2   Title Improve TUG score from 19.03 secs to </= 16.5 secs with no device for decr. fall risk.    Baseline 12.00 secs without device on 03-14-21; 19 secs on 05-18-21;    11.66 secs without device on 06-19-21    Time 4    Period Weeks    Status Achieved    Target Date 06/16/21      PT SHORT TERM GOAL #3   Title Incr. gait velocity from 2.22 ft/sec to >/= 2.6 ft/sec for incr. gait efficiency (without device).    Baseline 10.81 secs = 3.03 ft/sec without device - 03-14-21; 14.78 secs = 2.22 ft/sec on 05-18-21;    11.41 secs = 2.87 ft/sec without device    Time 4    Period Weeks    Status Achieved    Target Date 06/16/21      PT SHORT TERM GOAL #4   Title Amb. 350' nonstop without device with SBA on flat, even surface to demo improved endurance.    Baseline 115' on 05-18-21;  350' with SBA on flat, even surface- 06-19-21 - pt needed to stop after amb. approx. 290' due to increased trunk lean and slight shuffling of Rt foot    Time 4    Period Weeks    Status Achieved    Target Date 06/16/21      PT SHORT TERM GOAL  #5   Title Independent in HEP for balance & strengthening exs.  PT Long Term Goals - 07/13/21 0809       PT LONG TERM GOAL #1   Title Increase Berg score to >/= 48/56 for reduced fall risk;  UPGRADED LTG - increase Berg score to >/= 54/56 to demo improved balance.    Baseline 33/56 on 02-13-21; score 42/56 on 05-18-21;   52/56 on 07-13-21    Time 8    Period Weeks    Status Revised    Target Date 08/11/21      PT LONG TERM GOAL #2   Title Improve TUG score from 19.03 secs to </= 15 secs without device to reduce fall risk.    Baseline 28.12 secs without device;  19.03 secs with bil. UE support on 05-18-21;  10.59 secs on 07-13-21 with no device    Time 8    Period Weeks    Status Achieved    Target Date 07/14/21      PT LONG TERM GOAL #3   Title Pt will amb. 500' with SPC on even and uneven surfaces with supervision for incr. community accessibility.    Baseline 115' with no device at end of session - stopped due to fatigue - 05-18-21;  07-13-21;  500' without cane without SPC    Time 8    Period Weeks    Status Achieved    Target Date 07/14/21      PT LONG TERM GOAL #4   Title Perform sit to stand from mat table without UE support to demo improved LE strength. UPDATED - perform sit to stand 10 times without UE support without LOB upon initial standing to demo improved LE strength.    Baseline met 05-18-21 ;  updated goal met 07-13-21    Time 8    Period Weeks    Status Achieved    Target Date 07/14/21      PT LONG TERM GOAL #5   Title Independent in updated HEP for balance and strengthening.  Revised LTG; independent in updated HEP to include aquatic exercises if pt chooses to join community facility with a pool after D/C from PT.    Baseline met 05-18-21 but pt is not compliant with HEP at home    Time 8    Period Weeks    Status Revised    Target Date 08/11/21      Additional Long Term Goals   Additional Long Term Goals Yes      PT LONG TERM GOAL #6    Title Increase Foto score from 53/100 to >/= 60/100 to demo improvement in functional status.    Baseline 53/100 on 02-13-21    Time 8    Period Weeks    Status On-going    Target Date 08/11/21      PT LONG TERM GOAL #7   Title Negotiate 4 steps with 1 hand rail using a step over step sequence.    Time 8    Period Weeks    Status Achieved    Target Date 07/14/21      PT LONG TERM GOAL #8   Title Amb. 5" nonstop in pool to demo increased endurance/activity tolerance.    Time 4    Period Weeks    Status New    Target Date 08/11/21                   Plan - 07/31/21 1854     Clinical Impression Statement Today's skilled session continued to focus on gait, strengthening  and balance in the aquatic setting wtih no issues noted or reported. Pt's blood glucose remained stable with session today. The pt is progressing toward goals. Plan for discharge at next land visit.    Personal Factors and Comorbidities Age;Fitness;Comorbidity 2;Transportation;Past/Current Experience    Comorbidities h/o cervical fusion C5-7 (2012), h/o neck injury 2016, h/o prior CVA in March 2020, DM type II, HTN, stage 3 CKD    Examination-Activity Limitations Locomotion Level;Transfers;Bend;Stairs;Stand;Lift;Squat    Examination-Participation Restrictions Church;Cleaning;Meal Prep;Driving;Community Activity;Interpersonal Relationship;Laundry;Shop;Yard Work    [employer]chant [employer] officer Evolving/Moderate complexity    Rehab Potential Good    PT Frequency 2x / week    PT Duration 4 weeks    PT Treatment/Interventions ADLs/Self Care Home Management;Aquatic Therapy;Gait training;Stair training;Therapeutic activities;Therapeutic exercise;Balance training;DME Instruction;Neuromuscular re-education;Patient/family education    PT Next Visit Plan anticipated discharge at next session on land    PT Home Exercise Plan balance and sit to stand    Consulted and Agree with Plan of Care Patient;Family  member/caregiver    Family Member Consulted [name]             Patient will benefit from skilled therapeutic intervention in order to improve the following deficits and impairments:  Decreased balance, Decreased endurance, Decreased strength, Difficulty walking, Pain, Postural dysfunction  Visit Diagnosis: Other abnormalities of gait and mobility  Unsteadiness on feet  Muscle weakness (generalized)  Hemiplegia and hemiparesis following cerebral infarction affecting right dominant side (HCC)  Abnormal posture     Problem List Patient Active Problem List   Diagnosis Date Noted   TIA (transient ischemic attack) 03/10/2020   CKD (chronic kidney disease) 08/28/2018   Retinopathy of both eyes 08/28/2018   Stroke due to embolism of left vertebral artery ([place]) 08/12/2018   Stroke ([place]) 08/08/2018   Diabetes mellitus ([place]) 07/25/2015   Neck injury 06/22/2015   Hearing loss in right ear 10/14/2011   Agent orange exposure 10/14/2011   ADD (attention deficit disorder) 10/14/2011   Hyperlipidemia 10/14/2011   BMI 28.0-28.9,adult 10/14/2011    Willow Ora, PTA, [facility] [address], [place], [place] [address] [phone] 07/31/21, 7:29 PM   Name: ABDIAZIZ KLAHN MRN: [id number] Date of Birth: 27-Nov-1950

## 2021-08-03 ENCOUNTER — Other Ambulatory Visit: Payer: Self-pay

## 2021-08-03 ENCOUNTER — Ambulatory Visit: Payer: No Typology Code available for payment source | Attending: Internal Medicine | Admitting: Physical Therapy

## 2021-08-03 ENCOUNTER — Encounter: Payer: Self-pay | Admitting: Physical Therapy

## 2021-08-03 DIAGNOSIS — R2689 Other abnormalities of gait and mobility: Secondary | ICD-10-CM | POA: Insufficient documentation

## 2021-08-03 DIAGNOSIS — R2681 Unsteadiness on feet: Secondary | ICD-10-CM | POA: Diagnosis present

## 2021-08-03 DIAGNOSIS — M6281 Muscle weakness (generalized): Secondary | ICD-10-CM | POA: Diagnosis present

## 2021-08-04 NOTE — Therapy (Signed)
[facility] [address] [place], [place], [address] Phone: [phone]   Fax:  [phone]  Physical Therapy Treatment & Discharge Summary  Patient Details  Name: Jeff [name] MRN: [id number] Date of Birth: Aug 15, 1950 Referring Provider (PT): Eulogio Bear, DO   Encounter Date: 08/03/2021   PT End of Session - 08/04/21 1401     Visit Number 27    Number of Visits 27   15 visits authorized by [facility]   Date for PT Re-Evaluation 08/11/21    Authorization Type VA - 15 visits    Authorization Time Period additional auth 05-10-21 - 09-07-21    Authorization - Visit Number 15    Authorization - Number of Visits 15    PT Start Time 6962    PT Stop Time 0931    PT Time Calculation (min) 44 min    Equipment Utilized During Treatment Other (comment)   aquatic weights, pool noodle, single bar bells, aquatic cuffs   Activity Tolerance Patient tolerated treatment well    Behavior During Therapy WFL for tasks assessed/performed             Past Medical History:  Diagnosis Date   ADHD (attention deficit hyperactivity disorder)    Diabetes mellitus    Hyperlipidemia    Hypertension    Renal disorder    Stroke [facility])     Past Surgical History:  Procedure Laterality Date   IR ANGIO INTRA EXTRACRAN SEL COM CAROTID INNOMINATE BILAT MOD SED  01/03/2021   IR ANGIO VERTEBRAL SEL SUBCLAVIAN INNOMINATE UNI L MOD SED  01/03/2021   IR ANGIO VERTEBRAL SEL VERTEBRAL UNI R MOD SED  01/03/2021   IR RADIOLOGIST EVAL & MGMT  01/13/2021   IR US GUIDE VASC ACCESS RIGHT  01/03/2021   NECK SURGERY     SPINE SURGERY     Cervical spine x 2; Hardin Negus; [facility].    There were no vitals filed for this visit.   Subjective Assessment - 08/03/21 0847     Subjective No new complaints - still needs help going up steps without handrail but is doing well overall - ready for discharge today    Patient is accompained by: Family member   spouse [name]   Pertinent  History h/o prior CVA (08-08-18):  h/o cervical fusion C5-7 (2012): h/o neck injury 2016: DM type II, HTN,  stage 3 CKD    Limitations Walking;House hold activities;Lifting    Diagnostic tests MRI and MRA on 01-01-21    Patient Stated Goals Improve balance; walk without falling    Currently in Pain? No/denies                [facility] PT Assessment - 08/04/21 0001       Berg Balance Test   Sit to Stand Able to stand without using hands and stabilize independently    Standing Unsupported Able to stand safely 2 minutes    Sitting with Back Unsupported but Feet Supported on Floor or Stool Able to sit safely and securely 2 minutes    Stand to Sit Sits safely with minimal use of hands    Transfers Able to transfer safely, minor use of hands    Standing Unsupported with Eyes Closed Able to stand 10 seconds safely    Standing Unsupported with Feet Together Able to place feet together independently and stand 1 minute safely    From Standing, Reach Forward with Outstretched Arm Can reach confidently >25 cm (10")  From Standing Position, Pick up Object from [place] to pick up shoe safely and easily    From Standing Position, Turn to Look Behind Over each Shoulder Looks behind from both sides and weight shifts well    Turn 360 Degrees Able to turn 360 degrees safely in 4 seconds or less    Standing Unsupported, Alternately Place Feet on Step/Stool Able to stand independently and safely and complete 8 steps in 20 seconds    Standing Unsupported, One Foot in [place] to place foot tandem independently and hold 30 seconds    Standing on One Leg Able to lift leg independently and hold equal to or more than 3 seconds    Total Score 54                           [facility] Adult PT Treatment/Exercise - 08/04/21 0001       Transfers   Transfers Sit to Stand;Stand to Sit    Sit to Stand 5: Supervision    Stand to Sit 5: Supervision    Number of Reps 10 reps    Comments no UE support  used from mat table      Ambulation/Gait   Ambulation/Gait Yes    Ambulation/Gait Assistance 6: Modified independent (Device/Increase time)    Ambulation Distance (Feet) 230 Feet    Assistive device None    Gait Pattern Step-through pattern    Ambulation Surface Level;Indoor    Gait velocity 12.91 secs = 2.54 ft/sec    Stairs Yes    Stairs Assistance 6: Modified independent (Device/Increase time)    Stair Management Technique One rail Right;Alternating pattern;Forwards    Number of Stairs 4    Height of Stairs 6      Timed Up and Go Test   TUG Normal TUG    Normal TUG (seconds) 9.94   no device     High Level Balance   High Level Balance Comments LLE SLS = 3.56 secs:  RLE SLS 2.13 secs                       PT Short Term Goals - 08/04/21 1402       PT SHORT TERM GOAL #1   Title Improve Berg score from 42/56 to >/= 45/56 for reduced fall risk.    Baseline 03/21/21: 48/56 scored today;  05-18-21 42/56;   06-19-21   45/56    Time 4    Period Weeks    Status Achieved    Target Date 06/16/21      PT SHORT TERM GOAL #2   Title Improve TUG score from 19.03 secs to </= 16.5 secs with no device for decr. fall risk.    Baseline 12.00 secs without device on 03-14-21; 19 secs on 05-18-21;    11.66 secs without device on 06-19-21    Time 4    Period Weeks    Status Achieved    Target Date 06/16/21      PT SHORT TERM GOAL #3   Title Incr. gait velocity from 2.22 ft/sec to >/= 2.6 ft/sec for incr. gait efficiency (without device).    Baseline 10.81 secs = 3.03 ft/sec without device - 03-14-21; 14.78 secs = 2.22 ft/sec on 05-18-21;    11.41 secs = 2.87 ft/sec without device    Time 4    Period Weeks    Status Achieved    Target Date 06/16/21  PT SHORT TERM GOAL #4   Title Amb. 350' nonstop without device with SBA on flat, even surface to demo improved endurance.    Baseline 115' on 05-18-21;  350' with SBA on flat, even surface- 06-19-21 - pt needed to stop after  amb. approx. 290' due to increased trunk lean and slight shuffling of Rt foot    Time 4    Period Weeks    Status Achieved    Target Date 06/16/21      PT SHORT TERM GOAL #5   Title Independent in HEP for balance & strengthening exs.               PT Long Term Goals - 08/03/21 0849       PT LONG TERM GOAL #1   Title Increase Berg score to >/= 48/56 for reduced fall risk;  UPGRADED LTG - increase Berg score to >/= 54/56 to demo improved balance.    Baseline 33/56 on 02-13-21; score 42/56 on 05-18-21;   52/56 on 07-13-21;  54/56 on 08-03-21    Time 8    Period Weeks    Status Achieved    Target Date 08/11/21      PT LONG TERM GOAL #2   Title Improve TUG score from 19.03 secs to </= 15 secs without device to reduce fall risk.    Baseline 28.12 secs without device;  19.03 secs with bil. UE support on 05-18-21;  10.59 secs on 07-13-21 with no device;    9.94 secs on 08-03-21    Time 8    Period Weeks    Status Achieved    Target Date 07/14/21      PT LONG TERM GOAL #3   Title Pt will amb. 500' with SPC on even and uneven surfaces with supervision for incr. community accessibility.    Baseline 115' with no device at end of session - stopped due to fatigue - 05-18-21;  07-13-21;   met per pt report 08-03-21;  500' without cane without SPC    Time 8    Period Weeks    Status Achieved    Target Date 07/14/21      PT LONG TERM GOAL #4   Title Perform sit to stand from mat table without UE support to demo improved LE strength. UPDATED - perform sit to stand 10 times without UE support without LOB upon initial standing to demo improved LE strength.    Baseline met 05-18-21 ;  updated goal met 07-13-21; goal met 08-03-21    Time 8    Period Weeks    Status Achieved    Target Date 07/14/21      PT LONG TERM GOAL #5   Title Independent in updated HEP for balance and strengthening.  Revised LTG; independent in updated HEP to include aquatic exercises if pt chooses to join community facility with a  pool after D/C from PT.    Baseline met 05-18-21 but pt is not compliant with HEP at home; pt reports independence with HEP for land exercises    Time 8    Period Weeks    Status Achieved    Target Date 08/11/21      PT LONG TERM GOAL #6   Title Increase Foto score from 53/100 to >/= 60/100 to demo improvement in functional status.    Baseline 53/100 on 02-13-21    Time 8    Period Weeks    Status Achieved    Target Date 08/11/21  PT LONG TERM GOAL #7   Title Negotiate 4 steps with 1 hand rail using a step over step sequence.    Time 8    Period Weeks    Status Achieved    Target Date 07/14/21      PT LONG TERM GOAL #8   Title Amb. 5" nonstop in pool to demo increased endurance/activity tolerance.    Baseline met per pt report 08-03-21    Time 4    Period Weeks    Status Achieved    Target Date 08/11/21                   Plan - 08/04/21 1411     Clinical Impression Statement Pt has met 8/8 LTG's; Berg score = 54/56 and TUG score 9.94 secs without use of device, indicative of no fall risk at this time.  Pt has made excellent progress with improving balance and gait.  Pt is discharged due to goals met and completion of program with no remaining authorized visits at this time.    Personal Factors and Comorbidities Age;Fitness;Comorbidity 2;Transportation;Past/Current Experience    Comorbidities h/o cervical fusion C5-7 (2012), h/o neck injury 2016, h/o prior CVA in March 2020, DM type II, HTN, stage 3 CKD    Examination-Activity Limitations Locomotion Level;Transfers;Bend;Stairs;Stand;Lift;Squat    Examination-Participation Restrictions Church;Cleaning;Meal Prep;Driving;Community Activity;Interpersonal Relationship;Laundry;Shop;Yard Work    [employer]chant [employer] officer Evolving/Moderate complexity    Rehab Potential Good    PT Frequency 2x / week    PT Duration 4 weeks    PT Treatment/Interventions ADLs/Self Care Home Management;Aquatic Therapy;Gait  training;Stair training;Therapeutic activities;Therapeutic exercise;Balance training;DME Instruction;Neuromuscular re-education;Patient/family education    PT Next Visit Plan D/C on 08-03-21    PT Home Exercise Plan balance and sit to stand    Consulted and Agree with Plan of Care Patient;Family member/caregiver    Family Member Consulted [name]             Patient will benefit from skilled therapeutic intervention in order to improve the following deficits and impairments:  Decreased balance, Decreased endurance, Decreased strength, Difficulty walking, Pain, Postural dysfunction  Visit Diagnosis: Other abnormalities of gait and mobility  Unsteadiness on feet  Muscle weakness (generalized)     Problem List Patient Active Problem List   Diagnosis Date Noted   TIA (transient ischemic attack) 03/10/2020   CKD (chronic kidney disease) 08/28/2018   Retinopathy of both eyes 08/28/2018   Stroke due to embolism of left vertebral artery ([place]) 08/12/2018   Stroke ([place]) 08/08/2018   Diabetes mellitus ([place]) 07/25/2015   Neck injury 06/22/2015   Hearing loss in right ear 10/14/2011   Agent orange exposure 10/14/2011   ADD (attention deficit disorder) 10/14/2011   Hyperlipidemia 10/14/2011   BMI 28.0-28.9,adult 10/14/2011    PHYSICAL THERAPY DISCHARGE SUMMARY  Visits from Start of Care: 27  Current functional level related to goals / functional outcomes: See above for progress towards goals - all LTG's met   Remaining deficits: Decreased high level balance skills   Education / Equipment: Pt has been instructed in HEP for balance and LE strengthening.     Patient agrees to discharge. Patient goals were met. Patient is being discharged due to meeting the stated rehab goals.   FYBOFB, PZWCH ENIDPOE, PT 08/04/2021, 2:15 PM  [facility] [address] [place], [place], [address] Phone: [phone]   Fax:   [phone]  Name: Jeff [name] MRN: [id number] Date of Birth: 07-Aug-1950

## 2021-08-10 ENCOUNTER — Ambulatory Visit: Payer: No Typology Code available for payment source | Admitting: Physical Therapy

## 2021-10-18 DIAGNOSIS — N1831 Chronic kidney disease, stage 3a: Secondary | ICD-10-CM | POA: Diagnosis not present

## 2021-10-18 DIAGNOSIS — I129 Hypertensive chronic kidney disease with stage 1 through stage 4 chronic kidney disease, or unspecified chronic kidney disease: Secondary | ICD-10-CM | POA: Diagnosis not present

## 2021-10-18 DIAGNOSIS — E1122 Type 2 diabetes mellitus with diabetic chronic kidney disease: Secondary | ICD-10-CM | POA: Diagnosis not present

## 2021-10-18 DIAGNOSIS — I639 Cerebral infarction, unspecified: Secondary | ICD-10-CM | POA: Diagnosis not present

## 2021-11-13 ENCOUNTER — Encounter (INDEPENDENT_AMBULATORY_CARE_PROVIDER_SITE_OTHER): Payer: No Typology Code available for payment source | Admitting: Ophthalmology

## 2021-11-13 DIAGNOSIS — E113393 Type 2 diabetes mellitus with moderate nonproliferative diabetic retinopathy without macular edema, bilateral: Secondary | ICD-10-CM

## 2021-11-13 DIAGNOSIS — H43813 Vitreous degeneration, bilateral: Secondary | ICD-10-CM | POA: Diagnosis not present

## 2021-11-13 DIAGNOSIS — H35033 Hypertensive retinopathy, bilateral: Secondary | ICD-10-CM | POA: Diagnosis not present

## 2021-11-13 DIAGNOSIS — I10 Essential (primary) hypertension: Secondary | ICD-10-CM | POA: Diagnosis not present

## 2021-11-30 ENCOUNTER — Ambulatory Visit (INDEPENDENT_AMBULATORY_CARE_PROVIDER_SITE_OTHER): Payer: Medicare PPO | Admitting: Adult Health

## 2021-11-30 ENCOUNTER — Encounter: Payer: Self-pay | Admitting: Adult Health

## 2021-11-30 VITALS — BP 111/68 | HR 80 | Ht 68.0 in | Wt 206.0 lb

## 2021-11-30 DIAGNOSIS — I63532 Cerebral infarction due to unspecified occlusion or stenosis of left posterior cerebral artery: Secondary | ICD-10-CM | POA: Diagnosis not present

## 2021-11-30 DIAGNOSIS — G459 Transient cerebral ischemic attack, unspecified: Secondary | ICD-10-CM | POA: Diagnosis not present

## 2021-11-30 DIAGNOSIS — R519 Headache, unspecified: Secondary | ICD-10-CM | POA: Diagnosis not present

## 2021-11-30 DIAGNOSIS — R404 Transient alteration of awareness: Secondary | ICD-10-CM

## 2021-11-30 MED ORDER — TOPIRAMATE 25 MG PO TABS: 25.0000 mg | ORAL_TABLET | Freq: Two times a day (BID) | ORAL | 5 refills | Status: DC

## 2021-11-30 NOTE — Patient Instructions (Addendum)
Recommend completing EEG and MRI brain  Recommend starting topamax 25mg  twice daily for headaches  Continue aspirin 81 mg daily  and Crestor  for secondary stroke prevention  Continue to follow up with PCP/endocrinology regarding cholesterol, blood pressure and diabetes management  Maintain strict control of hypertension with blood pressure goal below 130/90, diabetes with hemoglobin A1c goal below 7.0 % and cholesterol with LDL cholesterol (bad cholesterol) goal below 70 mg/dL.   Signs of a Stroke? Follow the BEFAST method:  Balance Watch for a sudden loss of balance, trouble with coordination or vertigo Eyes Is there a sudden loss of vision in one or both eyes? Or double vision?  Face: Ask the person to smile. Does one side of the face droop or is it numb?  Arms: Ask the person to raise both arms. Does one arm drift downward? Is there weakness or numbness of a leg? Speech: Ask the person to repeat a simple phrase. Does the speech sound slurred/strange? Is the person confused ? Time: If you observe any of these signs, call 911.     Followup in the future with me in 3 months or call earlier if needed       Thank you for coming to see at [facility]. I hope we have been able to provide you high quality care today.  You may receive a patient satisfaction survey over the next few weeks. We would appreciate your feedback and comments so that we may continue to improve ourselves and the health of our patients.

## 2021-11-30 NOTE — Progress Notes (Signed)
[facility] [address] [place]. [place] [address] [phone]       OFFICE FOLLOW-UP NOTE  [name] Date of Birth:  09/26/1950 Medical Record Number:  [id number]   Primary neurologist: Dr. Leonie [name]   Chief Complaint  Patient presents with   Follow-up    Rm 3 with spouse Jeff [name]  Pt states he is doing well but has been having more headaches recently.   HISTORY:  Jeff [name] is a 71 y.o. male with hx of left cerebellum DWI negative stroke on 08/09/2018 due to left VA occlusion with residual imbalance/gait impairment and right-sided ataxia, TIA 12/2020, transient AMS 08/2019 possibly in setting of hypoglycemia, OSA, HTN, HLD and DM.    HPI:   Update 11/30/2021 JM: Patient returns for follow-up visit after prior visit just about 6 months ago.  Accompanied by his wife.   Prior concerns of left-sided orbital headache have persisted, inflammatory markers negative. Seen by ophthalmologist Dr. Zigmund [name] who did not feel headache related to his retinopathy or any other eye condition. Currently, occurring 2-3x per week, previously using Tylenol with resolution but is unable to continue to use due to altering results of Dexcom meter.  Headaches will typically resolve after laying down and taking a nap.  Occasional photophobia, denies phonophobia or N/V.  Denies any prior history of headaches or migraines or any family history.  Also mentions 2 episodes in March and May of "zoning out", does not lose conscousness, able to hear people around him, able to follow commands but unable to speak. Does report sensation of dizziness and hazy vision prior to episodes. Wife notes he becomes emotional during episode which he is unable to control. Lasts approx 3 to 4 minutes. Reports fatigue after episode. Does mention episode in May, noted hypoglycemic but unable to recall level.  Blood pressure not checked during these times.  No prior diagnosis of seizures or family history of seizures.   Has had prior EEG in 03/2020 which was normal after transient disorientation and speech difficulty in setting of hypoglycemia with resolution of symptoms after correction.  Compliant on aspirin and Crestor, denies side effects.  Blood pressure today 111/68.  [facility] which has been satisfactory (unable to view via epic).  No further concerns at this time       History provided for reference purposes only Update 06/07/2020 JM: [name] is here today for a stroke follow-up accompanied by his wife.  Denies new or reoccurring stroke/TIA symptoms. Does report left orbital headache 2-3x per day present past 2 months - quick sharp pain - only lasts for a few seconds. Will have occasional dull headache in same area relieved by Tylenol. No visual changes.  Denies photophobia, phonophobia or N/V. Does follow with Dr. Katy [name] and Dr. Rodena [name] (retina) - has f/u with Dr. Zigmund [name] 1/27.  No prior history of headaches or migraines.  Recently established care with endocrinologist Dr. Chalmers [name] with improvement of glucose levels.  Prior A1c 7.8 (02/2021) - believes more recent level completed but unknown level -unable to view via epic. Remains on Aspirin and Crestor without side effects. More recent lipid panel 01/2021 LDL 41. BP today 128/82.  Does not routinely monitor at home.  No further concerns at this time   UPDATE 03/07/2021 PS: [name] is a pleasant 71 year old Caucasian male seen today for initial office follow-up visit following hospital consultation for TIA.  History is obtained from the patient and and wife  and have personally reviewed electronic medical records as well as pertinent available imaging films in PACS. [name] has past medical history of diabetes, pretension, hyperlipidemia, obstructive sleep apnea and prior stroke with some residual right-sided weakness and gait difficulty who presented on 01/01/2021 with sudden onset of word finding difficulties, slurred speech  and fall due to bilateral right greater than left leg weakness.  He was fine when he went to bed the night prior but when he woke up he could not remember the exact time but noticed that his legs were weak and he had a fall while trying to get up and when he tried to speak his speech was not right.  His speech was slurred and he had trouble getting the words out.  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  He had a prior history of a stroke in March 2020 which was a punctate left medullary infarct for which she was treated with tPA with modest improvement but with some residual right-sided deficits and gait imbalance.  MRI scan of the brain showed no acute abnormality and showed chronic lacunar infarcts in the right basal ganglia carotid ultrasound showed only 1-39% bilateral carotid stenosis.  2D echo showed ejection fraction of 60 to 65% without definite clot.  LDL cholesterol 41 mg percent and hemoglobin A1c was elevated 8.6.  Patient also had diagnosis of a catheter angiogram on 01/03/2021 which showed only moderate 50% bilateral cavernous carotid stenosis and 50 to 60% proximal right PCA stenosis.  Left vertebral artery was occluded in the distal segment.  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  There was felt to be high-grade stenosis of the superior division of the left middle cerebral artery but this was difficult to appreciate on the corresponding CT angio and MR angiogram images and states it was felt that patient should be treated with maximal medical therapy and was discharged on aspirin and Brilinta for 3 months..   He states he is doing well is tolerating both medications without significant bruising or  bleeding.  Blood pressures under good control today it is 109/72.  Is tolerating Crestor well without muscle aches and pains.  His sugars however fluctuate quite a bit.  He has an appointment to see endocrinologist to discuss better diabetes control.  Patient does have history of sleep apnea and snores a lot.  However he has tried 4 different CPAP mask and has not been able to tolerate them.  He sees a neurologist in the [place] system and plans to discuss alternative treatment options at next visit.  He is currently doing outpatient physical occupational therapy.  He still has mild paresthesias in his feet from diabetic neuropathy.  He is also getting some outpatient occupational therapy for right frozen shoulder at the [place].  He has no new complaints.    ROS:   14 system review of systems is positive for those listed in HPI and all other systems negative  PMH:  Past Medical History:  Diagnosis Date   ADHD (attention deficit hyperactivity disorder)    Diabetes mellitus    Hyperlipidemia    Hypertension    Renal disorder    Stroke [facility])     Social History:  Social History   Socioeconomic History   Marital status: Married    Spouse name: Not on file   Number of children: Not on file   Years of education: Not on file   Highest education level: Not on file  Occupational History   Occupation: Archivist  Tobacco Use   Smoking status: Never   Smokeless tobacco: Never  Vaping Use   Vaping Use: Never used  Substance and Sexual Activity   Alcohol use: No    Alcohol/week: 0.0 standard drinks of alcohol   Drug use: No   Sexual activity: Yes  Other Topics Concern   Not on file  Social History Narrative   Not on file   Social Determinants of Health   Financial Resource Strain: Not on file  Food Insecurity: Not on file  Transportation Needs: Not on file  Physical Activity: Not on file  Stress: Not on file  Social Connections: Not on file  Intimate Partner  Violence: Not on file    Medications:   Current Outpatient Medications on File Prior to Visit  Medication Sig Dispense Refill   acetaminophen (TYLENOL) 500 MG tablet Take 500-1,000 mg by mouth every 6 (six) hours as needed for headache.     aspirin EC 81 MG EC tablet Take 1 tablet (81 mg total) by mouth daily. Swallow whole. 30 tablet 11   Cholecalciferol (VITAMIN D) 50 MCG (2000 UT) tablet Take 2,000 Units by mouth daily.     empagliflozin (JARDIANCE) 25 MG TABS tablet TAKE ONE-HALF TABLET BY MOUTH EVERY MORNING FOR DIABETES; THIS REPLACES METFORMIN XR.     Glucagon 1 MG/0.2ML SOAJ Inject 1 mg into the skin as needed (low blood sugar).     glucose blood (ONETOUCH VERIO) test strip 1 each by Other route 4 (four) times daily -  before meals and at bedtime. dexcom 6     insulin aspart (NOVOLOG FLEXPEN) 100 UNIT/ML FlexPen 23 Units 3 (three) times daily as needed for high blood sugar. Sliding scale per endocrinologist     insulin glargine (LANTUS) 100 UNIT/ML injection Inject 0.45-0.55 mLs (45-55 Units total) into the skin 2 (two) times daily. Inject 45 units in the morning and 55 units at bedtime (Patient taking differently: Inject into the skin 2 (two) times daily. Inject 55 units in the morning and 45 units at bedtime)     Insulin Syringe-Needle U-100 (B-D INS SYR ULTRAFINE 1CC/31G) 31G X 5/16" 1 ML MISC Used to inject insulin twice daily. 90 each 2   levothyroxine (SYNTHROID) 50 MCG tablet Take 50 mcg by mouth daily before breakfast.     lisinopril (ZESTRIL) 2.5 MG tablet Take 1 tablet (2.5 mg total) by mouth daily.     Magnesium Oxide 420 MG TABS Take 1 tablet by mouth in the morning and at bedtime.     Multiple Vitamins-Minerals (ICAPS AREDS 2 PO) Take 1 capsule by mouth in the morning and at bedtime.     OZEMPIC, 1 MG/DOSE, 4 MG/3ML SOPN Inject into the skin.     pantoprazole (PROTONIX) 40 MG tablet Take 1 tablet (40 mg total) by mouth daily.     rosuvastatin (CRESTOR) 40 MG tablet TAKE 1  TABLET BY MOUTH DAILY 90 tablet 0   No current facility-administered medications on file prior to visit.    Allergies:   Allergies  Allergen Reactions   Flexeril [Cyclobenzaprine Hcl] Other (See Comments)    Causes him to pass out and lowers his BP   Lidocaine  Swelling    throat   Novocain [Procaine Hcl] Swelling    Physical Exam Today's Vitals   11/30/21 1237  BP: 111/68  Pulse: 80  Weight: 206 lb (93.4 kg)  Height: 5\' 8"  (1.727 m)   Body mass index is 31.32 kg/m.    General: well developed, well nourished very pleasant elderly Caucasian male, seated, in no evident distress Head: head normocephalic and atraumatic. Mild tenderness with palpation over left temple Neck: supple with no carotid or supraclavicular bruits Cardiovascular: regular rate and rhythm, no murmurs Musculoskeletal: no deformity Skin:  no rash/petichiae Vascular:  Normal pulses all extremities  Neurologic Exam Mental Status: Awake and fully alert.  Fluent speech and language.  Oriented to place and time. Recent and remote memory intact. Attention span, concentration and fund of knowledge appropriate. Mood and affect appropriate.  Cranial Nerves: Pupils equal, briskly reactive to light. Extraocular movements full without nystagmus. Visual fields full to confrontation. Hearing intact. Facial sensation intact. Face, tongue, palate moves normally and symmetrically.  Motor: Normal bulk and tone. Normal strength in all tested extremity muscles. Sensory.: intact to touch ,pinprick but slightly impaired.position and vibratory sensation over both feet bilaterally..  Romberg is mildly positive Coordination: Rapid alternating movements normal in all extremities. Finger-to-nose and heel-to-shin mild right sided ataxia - chronic, stable. Gait and Station: Arises from chair without difficulty. Stance is normal. Gait demonstrates normal stride length and mild imbalance without use of AD.  Unable to perform heel, toe and  tandem walk.  Reflexes: 1+ and symmetric. Toes downgoing.       ASSESSMENT/PLAN: JACSEN POWLES is a 71 y.o. year old male with left cerebellum DWI negative infarct s/p TPA on 08/09/2018 secondary to left VA occlusion versus arthrosclerosis given risk factors and ICA siphon atherosclerosis and residual right-sided ataxia and gait impairment, and left hemispheric TIA 12/2020.  Vascular risk factors include HTN, HLD, intracranial stenosis and DM. Onset of left orbital headaches starting around 04/2021 and 2x transient altered awareness and speech arrest in 08/2021 and 10/2021     Orbital headaches Quick sharp sensation 2-3x per week, no red flag symptoms, exam stable, denies cranial autonomic symptoms Negative inflammatory markers Complete MR brain as headaches have persisted over the past 7 months  Initiate topiramate 25 mg twice daily -discussed potential side effects.  Advised to call after 1 to 2 weeks if no benefit or sooner if any difficulty tolerating Discussed warning signs and to proceed to 911 immediately if any should occur or headaches worsen  Transient episodes Unknown etiology - DDx seizures vs TIA/stroke vs hypoglycemia Obtain EEG to rule out seizures as he is at increased risk with history of prior strokes, emotional lability during event and fatigue following event Complete MR brain  May need to consider initiating antiseizure medication if additional events occur to see if this helps cease events but as only 2 prior episodes, will hold off at this time - advised to call with any additional events as well as note glucose level and blood pressure during that time. Starting topiramate for headache prevention which can also be beneficial in this case if these are not back seizures  Hx of stroke, TIA Continue aspirin and Crestor for secondary stroke prevention Ensure close PCP follow-up for aggressive stroke risk factor management including HTN with BP goal<130/90 and HLD with LDL goal<70  and close follow-up with endocrinology for DM management with A1c goal<7.0 Stroke labs 08/2021: A1c 8.1, LDL 63    Follow-up in 3 months  or call earlier if needed     CC:  [facility] provider: Dr. Bedelia Person, MD   I spent 39 minutes of face-to-face and non-face-to-face time with patient and wife.  This included previsit chart review, lab review, study review, order entry, electronic health record documentation, patient and wife education and discussion regarding above diagnoses and answered all other questions to patient and wife satisfaction   Frann Rider, AGNP-BC  [facility] [address] [place], [place] [address]  Phone [phone] Fax [phone] Note: This document was prepared with digital dictation and possible smart phrase technology. Any transcriptional errors that result from this process are unintentional.

## 2021-12-07 ENCOUNTER — Ambulatory Visit: Payer: No Typology Code available for payment source | Admitting: Adult Health

## 2021-12-11 ENCOUNTER — Telehealth: Payer: Self-pay | Admitting: Adult Health

## 2021-12-11 NOTE — Telephone Encounter (Signed)
Francine Graven Berkley Harvey: [id number] exp. 12/11/21-01/10/22 sent to GI   Our office never received a referral from the [place]. I sent the patient a message asking [facility] to [facility] [place] [facility].

## 2021-12-27 ENCOUNTER — Ambulatory Visit (INDEPENDENT_AMBULATORY_CARE_PROVIDER_SITE_OTHER): Payer: Medicare PPO | Admitting: Neurology

## 2021-12-27 DIAGNOSIS — I63532 Cerebral infarction due to unspecified occlusion or stenosis of left posterior cerebral artery: Secondary | ICD-10-CM

## 2021-12-27 DIAGNOSIS — R4182 Altered mental status, unspecified: Secondary | ICD-10-CM | POA: Diagnosis not present

## 2021-12-27 DIAGNOSIS — R404 Transient alteration of awareness: Secondary | ICD-10-CM

## 2022-01-04 ENCOUNTER — Ambulatory Visit
Admission: RE | Admit: 2022-01-04 | Discharge: 2022-01-04 | Disposition: A | Discharge status: Home / Self Care | Payer: No Typology Code available for payment source | Source: Ambulatory Visit | Attending: Adult Health | Admitting: Adult Health

## 2022-01-04 DIAGNOSIS — I63532 Cerebral infarction due to unspecified occlusion or stenosis of left posterior cerebral artery: Secondary | ICD-10-CM | POA: Diagnosis not present

## 2022-01-04 DIAGNOSIS — R404 Transient alteration of awareness: Secondary | ICD-10-CM | POA: Diagnosis not present

## 2022-01-04 DIAGNOSIS — R519 Headache, unspecified: Secondary | ICD-10-CM

## 2022-01-04 DIAGNOSIS — G459 Transient cerebral ischemic attack, unspecified: Secondary | ICD-10-CM

## 2022-01-05 DIAGNOSIS — I63532 Cerebral infarction due to unspecified occlusion or stenosis of left posterior cerebral artery: Secondary | ICD-10-CM | POA: Diagnosis not present

## 2022-01-08 ENCOUNTER — Telehealth: Payer: Self-pay

## 2022-01-08 NOTE — Telephone Encounter (Signed)
Contacted pt, went over results. Stated his headaches have been manageable and declined to increase any medications at this time. Advised to call back if things change. Pt verbally understood and was appreciative.

## 2022-01-08 NOTE — Telephone Encounter (Signed)
-----   Message from Ihor Austin, NP sent at 01/08/2022  7:29 AM EDT ----- Please call patient for results.  Please advise him that recent MRI brain did not show any new concerning findings compared to his prior MRIs.  Please see if his headaches have continued since starting topiramate and if so, we can consider increasing dosage.  Thank you.

## 2022-01-24 ENCOUNTER — Encounter: Payer: Self-pay | Admitting: Adult Health

## 2022-01-25 ENCOUNTER — Other Ambulatory Visit: Payer: Self-pay | Admitting: *Deleted

## 2022-01-25 MED ORDER — TOPIRAMATE 25 MG PO TABS: 25.0000 mg | ORAL_TABLET | Freq: Two times a day (BID) | ORAL | 5 refills | Status: DC

## 2022-03-12 NOTE — Progress Notes (Unsigned)
[facility] [address] [place]. [place] [address] [phone]       OFFICE FOLLOW-UP NOTE  [name] Date of Birth:  Nov 25, 1950 Medical Record Number:  [id number]   Primary neurologist: Dr. Leonie Man   No chief complaint on file.  HISTORY:  Jeff [name] is a 71 y.o. male with hx of left cerebellum DWI negative stroke on 08/09/2018 due to left VA occlusion with residual imbalance/gait impairment and right-sided ataxia, TIA 12/2020, transient AMS 08/2019 possibly in setting of hypoglycemia and recurrent episodes of "zoning out" in spring 2023, and left-sided headaches.     HPI:   Update 03/13/2022 Jeff [name]: Patient returns for follow-up visit.  Accompanied by his wife.  Headaches: Currently on topiramate 25 mg twice daily   Transient episodes: At prior visit, reported 2 episodes in March and May of "zoning out", did not lose consciousness, able to hear people around him, able to follow commands but unable to speak.  Becomes emotional during event.  Last 3 to 4 minutes.  Had fatigue after.  Completed EEG which was negative.  Repeat MRI brain negative for acute findings or changes compared to prior imaging 1 year ago.   Stable from stroke standpoint.  No new stroke/TIA symptoms.  Compliant on aspirin and Crestor.  Blood pressure well controlled.  [facility]     Update 11/30/2021 Jeff [name]: Patient returns for follow-up visit after prior visit just about 6 months ago.  Accompanied by his wife.   Prior concerns of left-sided orbital headache have persisted, inflammatory markers negative. Seen by ophthalmologist Dr. Zigmund Daniel who did not feel headache related to his retinopathy or any other eye condition. Currently, occurring 2-3x per week, previously using Tylenol with resolution but is unable to continue to use due to altering results of Dexcom meter.  Headaches will typically resolve after laying down and taking a nap.  Occasional photophobia, denies phonophobia  or N/V.  Denies any prior history of headaches or migraines or any family history.  Also mentions 2 episodes in March and May of "zoning out", does not lose conscousness, able to hear people around him, able to follow commands but unable to speak. Does report sensation of dizziness and hazy vision prior to episodes. Wife notes he becomes emotional during episode which he is unable to control. Lasts approx 3 to 4 minutes. Reports fatigue after episode. Does mention episode in May, noted hypoglycemic but unable to recall level.  Blood pressure not checked during these times.  No prior diagnosis of seizures or family history of seizures.  Has had prior EEG in 03/2020 which was normal after transient disorientation and speech difficulty in setting of hypoglycemia with resolution of symptoms after correction.  Compliant on aspirin and Crestor, denies side effects.  Blood pressure today 111/68.  [facility] with [facility] work which has been satisfactory (unable to view via epic).  No further concerns at this time       History provided for reference purposes only Update 06/07/2020 Jeff [name]: Jeff [name] is here today for a stroke follow-up accompanied by his wife.  Denies new or reoccurring stroke/TIA symptoms. Does report left orbital headache 2-3x per day present past 2 months - quick sharp pain - only lasts for a few seconds. Will have occasional dull headache in same area relieved by Tylenol. No visual changes.  Denies photophobia, phonophobia or N/V. Does follow with Dr. Katy Fitch and Dr. Rodena Piety (retina) - has f/u with Dr. Zigmund Daniel 1/27.  No prior history of headaches or migraines.  Recently established care with endocrinologist Dr. Chalmers Cater with improvement of glucose levels.  Prior A1c 7.8 (02/2021) - believes more recent level completed but unknown level -unable to view via epic. Remains on Aspirin and Crestor without side effects. More recent lipid panel 01/2021 LDL 41. BP today 128/82.  Does not  routinely monitor at home.  No further concerns at this time   UPDATE 03/07/2021 PS: Jeff [name] is a pleasant 71 year old Caucasian male seen today for initial office follow-up visit following hospital consultation for TIA.  History is obtained from the patient and and wife and have personally reviewed electronic medical records as well as pertinent available imaging films in PACS. Jeff [name] has past medical history of diabetes, pretension, hyperlipidemia, obstructive sleep apnea and prior stroke with some residual right-sided weakness and gait difficulty who presented on 01/01/2021 with sudden onset of word finding difficulties, slurred speech and fall due to bilateral right greater than left leg weakness.  He was fine when he went to bed the night prior but when he woke up he could not remember the exact time but noticed that his legs were weak and he had a fall while trying to get up and when he tried to speak his speech was not right.  His speech was slurred and he had trouble getting the words out.  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  He had a prior history of a stroke in March 2020 which was a punctate left medullary infarct for which she was treated with tPA with modest improvement but with some residual right-sided deficits and gait imbalance.  MRI scan of the brain showed no acute abnormality and showed chronic lacunar infarcts in the right basal ganglia carotid ultrasound showed only 1-39% bilateral carotid stenosis.  2D echo showed ejection fraction of 60 to 65% without definite clot.  LDL cholesterol 41 mg percent and hemoglobin A1c was elevated 8.6.  Patient also had diagnosis of a catheter angiogram on 01/03/2021 which showed  only moderate 50% bilateral cavernous carotid stenosis and 50 to 60% proximal right PCA stenosis.  Left vertebral artery was occluded in the distal segment.  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  There was felt to be high-grade stenosis of the superior division of the left middle cerebral artery but this was difficult to appreciate on the corresponding CT angio and MR angiogram images and states it was felt that patient should be treated with maximal medical therapy and was discharged on aspirin and Brilinta for 3 months..   He states he is doing well is tolerating both medications without significant bruising or bleeding.  Blood pressures under good control today it is 109/72.  Is tolerating Crestor well without muscle aches and pains.  His sugars however fluctuate quite a bit.  He has an appointment to see endocrinologist to discuss better diabetes control.  Patient does have history of sleep apnea and snores a lot.  However he has tried 4 different CPAP mask and has not been able to tolerate them.  He sees a neurologist in the [place] system and plans to discuss alternative treatment options at next visit.  He is currently doing outpatient  physical occupational therapy.  He still has mild paresthesias in his feet from diabetic neuropathy.  He is also getting some outpatient occupational therapy for right frozen shoulder at the [place].  He has no new complaints.    ROS:   14 system review of systems is positive for those listed in HPI and all other systems negative  PMH:  Past Medical History:  Diagnosis Date   ADHD (attention deficit hyperactivity disorder)    Diabetes mellitus    Hyperlipidemia    Hypertension    Renal disorder    Stroke [facility])     Social History:  Social History   Socioeconomic History   Marital status: Married    Spouse name: Not on file   Number of children: Not on file   Years of education: Not on file   Highest education level: Not on file   Occupational History   Occupation: Archivist  Tobacco Use   Smoking status: Never   Smokeless tobacco: Never  Vaping Use   Vaping Use: Never used  Substance and Sexual Activity   Alcohol use: No    Alcohol/week: 0.0 standard drinks of alcohol   Drug use: No   Sexual activity: Yes  Other Topics Concern   Not on file  Social History Narrative   Not on file   Social Determinants of Health   Financial Resource Strain: Not on file  Food Insecurity: Not on file  Transportation Needs: Not on file  Physical Activity: Not on file  Stress: Not on file  Social Connections: Not on file  Intimate Partner Violence: Not on file    Medications:   Current Outpatient Medications on File Prior to Visit  Medication Sig Dispense Refill   acetaminophen (TYLENOL) 500 MG tablet Take 500-1,000 mg by mouth every 6 (six) hours as needed for headache.     aspirin EC 81 MG EC tablet Take 1 tablet (81 mg total) by mouth daily. Swallow whole. 30 tablet 11   Cholecalciferol (VITAMIN D) 50 MCG (2000 UT) tablet Take 2,000 Units by mouth daily.     empagliflozin (JARDIANCE) 25 MG TABS tablet TAKE ONE-HALF TABLET BY MOUTH EVERY MORNING FOR DIABETES; THIS REPLACES METFORMIN XR.     Glucagon 1 MG/0.2ML SOAJ Inject 1 mg into the skin as needed (low blood sugar).     glucose blood (ONETOUCH VERIO) test strip 1 each by Other route 4 (four) times daily -  before meals and at bedtime. dexcom 6     insulin aspart (NOVOLOG FLEXPEN) 100 UNIT/ML FlexPen 23 Units 3 (three) times daily as needed for high blood sugar. Sliding scale per endocrinologist     insulin glargine (LANTUS) 100 UNIT/ML injection Inject 0.45-0.55 mLs (45-55 Units total) into the skin 2 (two) times daily. Inject 45 units in the morning and 55 units at bedtime (Patient taking differently: Inject into the skin 2 (two) times daily. Inject 55 units in the morning and 45 units at bedtime)     Insulin Syringe-Needle U-100 (B-D INS SYR  ULTRAFINE 1CC/31G) 31G X 5/16" 1 ML MISC Used to inject insulin twice daily. 90 each 2   levothyroxine (SYNTHROID) 50 MCG tablet Take 50 mcg by mouth daily before breakfast.     lisinopril (ZESTRIL) 2.5 MG tablet Take 1 tablet (2.5 mg total) by mouth daily.     Magnesium Oxide 420 MG TABS Take 1 tablet by mouth in the morning and at bedtime.     Multiple Vitamins-Minerals (ICAPS AREDS 2  PO) Take 1 capsule by mouth in the morning and at bedtime.     OZEMPIC, 1 MG/DOSE, 4 MG/3ML SOPN Inject into the skin.     pantoprazole (PROTONIX) 40 MG tablet Take 1 tablet (40 mg total) by mouth daily.     rosuvastatin (CRESTOR) 40 MG tablet TAKE 1 TABLET BY MOUTH DAILY 90 tablet 0   topiramate (TOPAMAX) 25 MG tablet Take 1 tablet (25 mg total) by mouth 2 (two) times daily. 60 tablet 5   No current facility-administered medications on file prior to visit.    Allergies:   Allergies  Allergen Reactions   Flexeril [Cyclobenzaprine Hcl] Other (See Comments)    Causes him to pass out and lowers his BP   Lidocaine Swelling    throat   Novocain [Procaine Hcl] Swelling    Physical Exam There were no vitals filed for this visit.  There is no height or weight on file to calculate BMI.    General: well developed, well nourished very pleasant elderly Caucasian male, seated, in no evident distress Head: head normocephalic and atraumatic. Mild tenderness with palpation over left temple Neck: supple with no carotid or supraclavicular bruits Cardiovascular: regular rate and rhythm, no murmurs Musculoskeletal: no deformity Skin:  no rash/petichiae Vascular:  Normal pulses all extremities  Neurologic Exam Mental Status: Awake and fully alert.  Fluent speech and language.  Oriented to place and time. Recent and remote memory intact. Attention span, concentration and fund of knowledge appropriate. Mood and affect appropriate.  Cranial Nerves: Pupils equal, briskly reactive to light. Extraocular movements full  without nystagmus. Visual fields full to confrontation. Hearing intact. Facial sensation intact. Face, tongue, palate moves normally and symmetrically.  Motor: Normal bulk and tone. Normal strength in all tested extremity muscles. Sensory.: intact to touch ,pinprick but slightly impaired.position and vibratory sensation over both feet bilaterally..  Romberg is mildly positive Coordination: Rapid alternating movements normal in all extremities. Finger-to-nose and heel-to-shin mild right sided ataxia - chronic, stable. Gait and Station: Arises from chair without difficulty. Stance is normal. Gait demonstrates normal stride length and mild imbalance without use of AD.  Unable to perform heel, toe and tandem walk.  Reflexes: 1+ and symmetric. Toes downgoing.       ASSESSMENT/PLAN: Jeff [name] is a 71 y.o. year old male with left cerebellum DWI negative infarct s/p TPA on 08/09/2018 secondary to left VA occlusion versus arthrosclerosis given risk factors and ICA siphon atherosclerosis and residual right-sided ataxia and gait impairment, and left hemispheric TIA 12/2020.  Vascular risk factors include HTN, HLD, intracranial stenosis and DM. Onset of left orbital headaches starting around 04/2021 and 2x transient altered awareness and speech arrest in 08/2021 and 10/2021     Orbital headaches Quick sharp sensation 2-3x per week, no red flag symptoms, exam stable, denies cranial autonomic symptoms Negative inflammatory markers Complete MR brain as headaches have persisted over the past 7 months  Initiate topiramate 25 mg twice daily -discussed potential side effects.  Advised to call after 1 to 2 weeks if no benefit or sooner if any difficulty tolerating Discussed warning signs and to proceed to 911 immediately if any should occur or headaches worsen  Transient episodes Unknown etiology - DDx seizures vs TIA/stroke vs hypoglycemia EEG 12/2021 no definite epileptiform activity noted  to rule out seizures as  he is at increased risk with history of prior strokes, emotional lability during event and fatigue following event MR brain 01/2022 chronic microvascular ischemic changes (unchanged since  12/2020 imaging), moderate cortical atrophy (unchanged since 2022 imaging), no acute findings May need to consider initiating antiseizure medication if additional events occur to see if this helps cease events but as only 2 prior episodes, will hold off at this time - advised to call with any additional events as well as note glucose level and blood pressure during that time. Starting topiramate for headache prevention which can also be beneficial in this case if these are not back seizures  Hx of stroke, TIA Continue aspirin and Crestor for secondary stroke prevention Ensure close PCP follow-up for aggressive stroke risk factor management including HTN with BP goal<130/90 and HLD with LDL goal<70 and close follow-up with endocrinology for DM management with A1c goal<7.0 Stroke labs 08/2021: A1c 8.1, LDL 63    Follow-up in 3 months or call earlier if needed     CC:  [facility] provider: Dr. Bedelia Person, MD   I spent 39 minutes of face-to-face and non-face-to-face time with patient and wife.  This included previsit chart review, lab review, study review, order entry, electronic health record documentation, patient and wife education and discussion regarding above diagnoses and answered all other questions to patient and wife satisfaction   Frann Rider, AGNP-BC  [facility] [address] [place], [place] [address]  Phone [phone] Fax [phone] Note: This document was prepared with digital dictation and possible smart phrase technology. Any transcriptional errors that result from this process are unintentional.

## 2022-03-13 ENCOUNTER — Ambulatory Visit (INDEPENDENT_AMBULATORY_CARE_PROVIDER_SITE_OTHER): Payer: Medicare PPO | Admitting: Adult Health

## 2022-03-13 ENCOUNTER — Encounter: Payer: Self-pay | Admitting: Adult Health

## 2022-03-13 VITALS — BP 105/67 | HR 83 | Ht 68.0 in | Wt 201.4 lb

## 2022-03-13 DIAGNOSIS — R519 Headache, unspecified: Secondary | ICD-10-CM | POA: Diagnosis not present

## 2022-03-13 DIAGNOSIS — G459 Transient cerebral ischemic attack, unspecified: Secondary | ICD-10-CM

## 2022-03-13 DIAGNOSIS — I63532 Cerebral infarction due to unspecified occlusion or stenosis of left posterior cerebral artery: Secondary | ICD-10-CM | POA: Diagnosis not present

## 2022-03-13 DIAGNOSIS — R404 Transient alteration of awareness: Secondary | ICD-10-CM

## 2022-03-13 NOTE — Patient Instructions (Signed)
Continue topamax 25 mg twice daily for headache prevention - please let me know after a couple weeks if headaches persist or sooner if any difficulty tolerating   Continue aspirin 81 mg daily  and Crestor  for secondary stroke prevention  Continue to follow up with PCP regarding cholesterol, blood pressure and blood pressure management  Maintain strict control of hypertension with blood pressure goal below 130/90, diabetes with hemoglobin A1c goal below 7.0% and cholesterol with LDL cholesterol (bad cholesterol) goal below 70 mg/dL.   Signs of a Stroke? Follow the BEFAST method:  Balance Watch for a sudden loss of balance, trouble with coordination or vertigo Eyes Is there a sudden loss of vision in one or both eyes? Or double vision?  Face: Ask the person to smile. Does one side of the face droop or is it numb?  Arms: Ask the person to raise both arms. Does one arm drift downward? Is there weakness or numbness of a leg? Speech: Ask the person to repeat a simple phrase. Does the speech sound slurred/strange? Is the person confused ? Time: If you observe any of these signs, call 911.     Followup in the future with me in 6 months or call earlier if needed     Thank you for coming to see [place] at [facility]. I hope we have been able to provide you high quality care today.  You may receive a patient satisfaction survey over the next few weeks. We would appreciate your feedback and comments so that we may continue to improve ourselves and the health of our patients.

## 2022-04-02 DIAGNOSIS — N1831 Chronic kidney disease, stage 3a: Secondary | ICD-10-CM | POA: Diagnosis not present

## 2022-04-02 DIAGNOSIS — E1122 Type 2 diabetes mellitus with diabetic chronic kidney disease: Secondary | ICD-10-CM | POA: Diagnosis not present

## 2022-04-02 DIAGNOSIS — I639 Cerebral infarction, unspecified: Secondary | ICD-10-CM | POA: Diagnosis not present

## 2022-04-02 DIAGNOSIS — I129 Hypertensive chronic kidney disease with stage 1 through stage 4 chronic kidney disease, or unspecified chronic kidney disease: Secondary | ICD-10-CM | POA: Diagnosis not present

## 2022-04-10 ENCOUNTER — Encounter: Payer: Self-pay | Admitting: Adult Health

## 2022-04-10 NOTE — Telephone Encounter (Signed)
Okay to increase to 50mg  BID. Thank you

## 2022-04-23 DIAGNOSIS — G629 Polyneuropathy, unspecified: Secondary | ICD-10-CM | POA: Diagnosis not present

## 2022-04-23 DIAGNOSIS — H3500 Unspecified background retinopathy: Secondary | ICD-10-CM | POA: Diagnosis not present

## 2022-04-23 DIAGNOSIS — N189 Chronic kidney disease, unspecified: Secondary | ICD-10-CM | POA: Diagnosis not present

## 2022-04-23 DIAGNOSIS — E7800 Pure hypercholesterolemia, unspecified: Secondary | ICD-10-CM | POA: Diagnosis not present

## 2022-04-23 DIAGNOSIS — E039 Hypothyroidism, unspecified: Secondary | ICD-10-CM | POA: Diagnosis not present

## 2022-04-23 DIAGNOSIS — R6889 Other general symptoms and signs: Secondary | ICD-10-CM | POA: Diagnosis not present

## 2022-04-23 DIAGNOSIS — I10 Essential (primary) hypertension: Secondary | ICD-10-CM | POA: Diagnosis not present

## 2022-04-23 DIAGNOSIS — I639 Cerebral infarction, unspecified: Secondary | ICD-10-CM | POA: Diagnosis not present

## 2022-04-23 DIAGNOSIS — E1165 Type 2 diabetes mellitus with hyperglycemia: Secondary | ICD-10-CM | POA: Diagnosis not present

## 2022-05-14 ENCOUNTER — Encounter (INDEPENDENT_AMBULATORY_CARE_PROVIDER_SITE_OTHER): Payer: Medicare PPO | Admitting: Ophthalmology

## 2022-05-14 DIAGNOSIS — E113392 Type 2 diabetes mellitus with moderate nonproliferative diabetic retinopathy without macular edema, left eye: Secondary | ICD-10-CM | POA: Diagnosis not present

## 2022-05-14 DIAGNOSIS — H43813 Vitreous degeneration, bilateral: Secondary | ICD-10-CM

## 2022-05-14 DIAGNOSIS — E113311 Type 2 diabetes mellitus with moderate nonproliferative diabetic retinopathy with macular edema, right eye: Secondary | ICD-10-CM

## 2022-05-14 DIAGNOSIS — H35033 Hypertensive retinopathy, bilateral: Secondary | ICD-10-CM

## 2022-05-14 DIAGNOSIS — I10 Essential (primary) hypertension: Secondary | ICD-10-CM

## 2022-07-02 ENCOUNTER — Encounter: Payer: Self-pay | Admitting: Adult Health

## 2022-07-03 MED ORDER — TOPIRAMATE 50 MG PO TABS: 50.0000 mg | ORAL_TABLET | Freq: Two times a day (BID) | ORAL | 1 refills | Status: DC

## 2022-07-18 DIAGNOSIS — H53022 Refractive amblyopia, left eye: Secondary | ICD-10-CM | POA: Diagnosis not present

## 2022-07-18 DIAGNOSIS — H04123 Dry eye syndrome of bilateral lacrimal glands: Secondary | ICD-10-CM | POA: Diagnosis not present

## 2022-07-18 DIAGNOSIS — Z961 Presence of intraocular lens: Secondary | ICD-10-CM | POA: Diagnosis not present

## 2022-07-18 DIAGNOSIS — H538 Other visual disturbances: Secondary | ICD-10-CM | POA: Diagnosis not present

## 2022-07-18 DIAGNOSIS — E113393 Type 2 diabetes mellitus with moderate nonproliferative diabetic retinopathy without macular edema, bilateral: Secondary | ICD-10-CM | POA: Diagnosis not present

## 2022-07-18 DIAGNOSIS — H5712 Ocular pain, left eye: Secondary | ICD-10-CM | POA: Diagnosis not present

## 2022-07-18 DIAGNOSIS — H43811 Vitreous degeneration, right eye: Secondary | ICD-10-CM | POA: Diagnosis not present

## 2022-07-18 DIAGNOSIS — H43823 Vitreomacular adhesion, bilateral: Secondary | ICD-10-CM | POA: Diagnosis not present

## 2022-07-18 DIAGNOSIS — I679 Cerebrovascular disease, unspecified: Secondary | ICD-10-CM | POA: Diagnosis not present

## 2022-08-28 ENCOUNTER — Telehealth: Payer: Self-pay | Admitting: Adult Health

## 2022-08-28 NOTE — Progress Notes (Unsigned)
[facility] [address] [place]. [place] [address] [phone]       OFFICE FOLLOW-UP NOTE  [name] Date of Birth:  June 10, 1950 Medical Record Number:  [id number]   Primary neurologist: Dr. Leonie Man   No chief complaint on file.  HISTORY:  Jeff [name] is a 72 y.o. male with hx of left cerebellum DWI negative stroke on 08/09/2018 due to left VA occlusion with residual imbalance/gait impairment and right-sided ataxia, TIA 12/2020, transient AMS 08/2019 possibly in setting of hypoglycemia and recurrent episodes of "zoning out" in spring 2023, and left-sided headaches.     HPI:   Update 08/29/2022 JM: Patient returns for 95-month follow-up accompanied by his wife.  Headaches currently well-controlled on topiramate 50 mg twice daily  He has not had any additional transient episodes of zoning out  Denies any new stroke/TIA symptoms.  Compliant on aspirin and Crestor.  Blood pressure well-controlled.  [facility]       History provided for reference purposes only Update 03/13/2022 JM: Patient returns for follow-up visit.  Accompanied by his wife.  Headaches: Reports some improvement of headaches not as severe or frequent but still present.  Currently prescribed topiramate 25 mg twice daily, per wife does not take consistently, sometimes will take just as needed. He is willing to take twice daily as prescribed  Transient episodes: He has not had any additional episodes since prior visit.  At prior visit, reported 2 episodes in March and May of "zoning out", did not lose consciousness, able to hear people around him, able to follow commands but unable to speak.  Becomes emotional during event.  Last 3 to 4 minutes.  Had fatigue after.  Completed EEG which was negative.  Repeat MRI brain negative for acute findings or changes compared to prior imaging 1 year ago.  Stable from stroke standpoint.  No new stroke/TIA symptoms.  Compliant on aspirin  and Crestor.  Blood pressure well controlled.  [facility]. Has been working on diet, has being losing weight intentionally, cut down on red meats, eating more chicken, salad, seafood, veggies and fruits.  Is scheduled to see PCP at the [place] in December.  Update 11/30/2021 JM: Patient returns for follow-up visit after prior visit just about 6 months ago.  Accompanied by his wife.   Prior concerns of left-sided orbital headache have persisted, inflammatory markers negative. Seen by ophthalmologist Dr. Zigmund Daniel who did not feel headache related to his retinopathy or any other eye condition. Currently, occurring 2-3x per week, previously using Tylenol with resolution but is unable to continue to use due to altering results of Dexcom meter.  Headaches will typically resolve after laying down and taking a nap.  Occasional photophobia, denies phonophobia or N/V.  Denies any prior history of headaches or migraines or any family history.  Also mentions 2 episodes in March and May of "zoning out", does not lose conscousness, able to hear people around him, able to follow commands but unable to speak. Does report sensation of dizziness and hazy vision prior to episodes. Wife notes he becomes emotional during episode which he is unable to control. Lasts approx 3 to 4 minutes. Reports fatigue after episode. Does mention episode in May, noted hypoglycemic but unable to recall level.  Blood pressure not checked during these times.  No prior diagnosis of seizures or family history of seizures.  Has had prior EEG in 03/2020 which was normal after transient disorientation and speech difficulty  in setting of hypoglycemia with resolution of symptoms after correction.  Compliant on aspirin and Crestor, denies side effects.  Blood pressure today 111/68.  [facility] with [facility] work which has been satisfactory (unable to view via epic).  No further concerns at this time   Update 06/07/2020 JM: Mr.  Jeff [name] is here today for a stroke follow-up accompanied by his wife.  Denies new or reoccurring stroke/TIA symptoms. Does report left orbital headache 2-3x per day present past 2 months - quick sharp pain - only lasts for a few seconds. Will have occasional dull headache in same area relieved by Tylenol. No visual changes.  Denies photophobia, phonophobia or N/V. Does follow with Dr. Katy Fitch and Dr. Rodena Piety (retina) - has f/u with Dr. Zigmund Daniel 1/27.  No prior history of headaches or migraines.  Recently established care with endocrinologist Dr. Chalmers Cater with improvement of glucose levels.  Prior A1c 7.8 (02/2021) - believes more recent level completed but unknown level -unable to view via epic. Remains on Aspirin and Crestor without side effects. More recent lipid panel 01/2021 LDL 41. BP today 128/82.  Does not routinely monitor at home.  No further concerns at this time   UPDATE 03/07/2021 PS: Jeff [name] is a pleasant 72 year old Caucasian male seen today for initial office follow-up visit following hospital consultation for TIA.  History is obtained from the patient and and wife and have personally reviewed electronic medical records as well as pertinent available imaging films in PACS. Jeff [name] has past medical history of diabetes, pretension, hyperlipidemia, obstructive sleep apnea and prior stroke with some residual right-sided weakness and gait difficulty who presented on 01/01/2021 with sudden onset of word finding difficulties, slurred speech and fall due to bilateral right greater than left leg weakness.  He was fine when he went to bed the night prior but when he woke up he could not remember the exact time but noticed that his legs were weak and he had a fall while trying to get up and when he tried to speak his speech was not right.  His speech was slurred and he had trouble getting the words out.  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  He had a prior history of a stroke in March 2020 which was a punctate left medullary infarct for which she was treated with tPA with modest improvement but with some residual right-sided deficits and gait imbalance.  MRI scan of the brain showed no acute abnormality and showed chronic lacunar infarcts in the right basal ganglia carotid ultrasound showed only 1-39% bilateral carotid stenosis.  2D echo showed ejection fraction of 60 to 65% without definite clot.  LDL cholesterol 41 mg percent and hemoglobin A1c was elevated 8.6.  Patient also had diagnosis of a catheter angiogram on 01/03/2021 which showed only moderate 50% bilateral cavernous carotid stenosis and 50 to 60% proximal right PCA stenosis.  Left vertebral artery was occluded in the distal segment.  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  There was felt to be high-grade stenosis of the superior division of the left middle cerebral artery but this was difficult to appreciate on the corresponding CT angio and MR angiogram images and states  it was felt that patient should be treated with maximal medical therapy and was discharged on aspirin and Brilinta for 3 months..   He states he is doing well is tolerating both medications without significant bruising or bleeding.  Blood pressures under good control today it is 109/72.  Is tolerating Crestor well without muscle aches and pains.  His sugars however fluctuate quite a bit.  He has an appointment to see endocrinologist to discuss better diabetes control.  Patient does have history of sleep apnea and snores a lot.  However he has tried 4 different CPAP mask and has not been able to tolerate them.  He sees a neurologist in the [place] system and plans to discuss alternative treatment  options at next visit.  He is currently doing outpatient physical occupational therapy.  He still has mild paresthesias in his feet from diabetic neuropathy.  He is also getting some outpatient occupational therapy for right frozen shoulder at the [place].  He has no new complaints.    ROS:   14 system review of systems is positive for those listed in HPI and all other systems negative  PMH:  Past Medical History:  Diagnosis Date   ADHD (attention deficit hyperactivity disorder)    Diabetes mellitus    Hyperlipidemia    Hypertension    Renal disorder    Stroke [facility])     Social History:  Social History   Socioeconomic History   Marital status: Married    Spouse name: [name]   Number of children: Not on file   Years of education: Not on file   Highest education level: Not on file  Occupational History   Occupation: Press photographer  Tobacco Use   Smoking status: Never   Smokeless tobacco: Never  Vaping Use   Vaping Use: Never used  Substance and Sexual Activity   Alcohol use: No    Alcohol/week: 0.0 standard drinks of alcohol   Drug use: No   Sexual activity: Yes  Other Topics Concern   Not on file  Social History Narrative   Lives with wife   Social Determinants of Health   Financial Resource Strain: Not on file  Food Insecurity: Not on file  Transportation Needs: Not on file  Physical Activity: Not on file  Stress: Not on file  Social Connections: Not on file  Intimate Partner Violence: Not on file    Medications:   Current Outpatient Medications on File Prior to Visit  Medication Sig Dispense Refill   acetaminophen (TYLENOL) 500 MG tablet Take 500-1,000 mg by mouth every 6 (six) hours as needed for headache.     aspirin EC 81 MG EC tablet Take 1 tablet (81 mg total) by mouth daily. Swallow whole. 30 tablet 11   Cholecalciferol (VITAMIN D) 50 MCG (2000 UT) tablet Take 2,000 Units by mouth daily.     empagliflozin (JARDIANCE) 25 MG TABS tablet TAKE  ONE-HALF TABLET BY MOUTH EVERY MORNING FOR DIABETES; THIS REPLACES METFORMIN XR.     Glucagon 1 MG/0.2ML SOAJ Inject 1 mg into the skin as needed (low blood sugar).     glucose blood (ONETOUCH VERIO) test strip 1 each by Other route 4 (four) times daily -  before meals and at bedtime. dexcom 6     insulin aspart (NOVOLOG FLEXPEN) 100 UNIT/ML FlexPen 23 Units 3 (three) times daily as needed for high blood sugar. Sliding scale per endocrinologist     insulin glargine (LANTUS) 100 UNIT/ML injection Inject 0.45-0.55 mLs (  45-55 Units total) into the skin 2 (two) times daily. Inject 45 units in the morning and 55 units at bedtime (Patient taking differently: Inject into the skin 2 (two) times daily. Inject 55 units in the morning and 45 units at bedtime)     Insulin Syringe-Needle U-100 (B-D INS SYR ULTRAFINE 1CC/31G) 31G X 5/16" 1 ML MISC Used to inject insulin twice daily. 90 each 2   levothyroxine (SYNTHROID) 50 MCG tablet Take 50 mcg by mouth daily before breakfast.     lisinopril (ZESTRIL) 2.5 MG tablet Take 1 tablet (2.5 mg total) by mouth daily.     Magnesium Oxide 420 MG TABS Take 1 tablet by mouth in the morning and at bedtime.     Multiple Vitamins-Minerals (ICAPS AREDS 2 PO) Take 1 capsule by mouth in the morning and at bedtime.     OZEMPIC, 1 MG/DOSE, 4 MG/3ML SOPN Inject into the skin.     pantoprazole (PROTONIX) 40 MG tablet Take 1 tablet (40 mg total) by mouth daily.     rosuvastatin (CRESTOR) 40 MG tablet TAKE 1 TABLET BY MOUTH DAILY 90 tablet 0   topiramate (TOPAMAX) 50 MG tablet Take 1 tablet (50 mg total) by mouth 2 (two) times daily. 180 tablet 1   No current facility-administered medications on file prior to visit.    Allergies:   Allergies  Allergen Reactions   Flexeril [Cyclobenzaprine Hcl] Other (See Comments)    Causes him to pass out and lowers his BP   Lidocaine Swelling    throat   Novocain [Procaine Hcl] Swelling    Physical Exam There were no vitals filed for this  visit.  There is no height or weight on file to calculate BMI.    General: well developed, well nourished very pleasant elderly Caucasian male, seated, in no evident distress Head: head normocephalic and atraumatic. Mild tenderness with palpation over left temple Neck: supple with no carotid or supraclavicular bruits Cardiovascular: regular rate and rhythm, no murmurs Musculoskeletal: no deformity Skin:  no rash/petichiae Vascular:  Normal pulses all extremities  Neurologic Exam Mental Status: Awake and fully alert.  Fluent speech and language.  Oriented to place and time. Recent and remote memory intact. Attention span, concentration and fund of knowledge appropriate. Mood and affect appropriate.  Cranial Nerves: Pupils equal, briskly reactive to light. Extraocular movements full without nystagmus. Visual fields full to confrontation. Hearing intact. Facial sensation intact. Face, tongue, palate moves normally and symmetrically.  Motor: Normal bulk and tone. Normal strength in all tested extremity muscles although mild RUE deltoid weakness from prior shoulder injury noted Sensory.: intact to touch ,pinprick but slightly impaired.position and vibratory sensation over both feet bilaterally..   Coordination: Rapid alternating movements normal in all extremities. Finger-to-nose and heel-to-shin mild right sided ataxia - chronic, stable. Gait and Station: Arises from chair without difficulty. Stance is normal. Gait demonstrates normal stride length and mild imbalance without use of AD.  Unable to perform heel, toe and tandem walk.  Reflexes: 1+ and symmetric. Toes downgoing.       ASSESSMENT/PLAN: Jeff [name] is a 72 y.o. year old male with left cerebellum DWI negative infarct s/p TPA on 08/09/2018 secondary to left VA occlusion versus arthrosclerosis given risk factors and ICA siphon atherosclerosis and residual right-sided ataxia and gait impairment, and left hemispheric TIA 12/2020.  Vascular  risk factors include HTN, HLD, intracranial stenosis and DM. Onset of left orbital headaches starting around 04/2021 and 2x transient altered awareness and speech  arrest in 08/2021 and 10/2021     Orbital headaches Headaches have improved since intermittent use of topiramate but still persistent, recommend taking topiramate 25 mg twice daily.  Advised to call after few weeks if no benefit or sooner if any difficulty tolerating. Quick sharp sensation 2-3x per week, no red flag symptoms, exam stable, denies cranial autonomic symptoms Negative inflammatory markers MRI brain negative for acute findings  Transient episodes Unknown etiology - DDx seizures vs TIA/stroke vs hypoglycemia EEG 12/2021 no definite epileptiform activity noted  MR brain 01/2022 chronic microvascular ischemic changes (unchanged since 12/2020 imaging), moderate cortical atrophy (unchanged since 2022 imaging), no acute findings May need to consider initiating antiseizure medication if additional events occur to see if this helps cease events but as only 2 prior episodes, will hold off at this time - advised to call with any additional events as well as note glucose level and blood pressure during that time. Starting topiramate for headache prevention which can also be beneficial in this case if these are not back seizures  Hx of stroke, TIA Continue aspirin and Crestor for secondary stroke prevention Ensure close PCP follow-up for aggressive stroke risk factor management including HTN with BP goal<130/90 and HLD with LDL goal<70 and close follow-up with endocrinology for DM management with A1c goal<7.0 Stroke labs 08/2021: A1c 8.1, LDL 63     Follow-up in 6 months or call earlier if needed    CC:  Landry Mellow, MD   I spent 33 minutes of face-to-face and non-face-to-face time with patient and wife.  This included previsit chart review, lab review, study review, electronic health record documentation, patient and wife  education and discussion regarding above diagnoses and answered all other questions to patient and wife satisfaction   Frann Rider, AGNP-BC  [facility] [address] [place], [place] [address]  Phone [phone] Fax [phone] Note: This document was prepared with digital dictation and possible smart phrase technology. Any transcriptional errors that result from this process are unintentional.

## 2022-08-28 NOTE — Telephone Encounter (Signed)
LVM and sent mychart msg informing pt of need to reschedule 09/12/22 appointment due to NP being out.   If pt calls back, I have held a slot for today at 3:45pm and for tomorrow at 12:45pm on Jessica's schedule that we can use

## 2022-08-29 ENCOUNTER — Encounter: Payer: Self-pay | Admitting: Adult Health

## 2022-08-29 ENCOUNTER — Ambulatory Visit: Payer: No Typology Code available for payment source | Admitting: Adult Health

## 2022-08-29 VITALS — BP 136/81 | HR 74 | Ht 68.0 in | Wt 197.0 lb

## 2022-08-29 DIAGNOSIS — I6502 Occlusion and stenosis of left vertebral artery: Secondary | ICD-10-CM | POA: Diagnosis not present

## 2022-08-29 DIAGNOSIS — R404 Transient alteration of awareness: Secondary | ICD-10-CM

## 2022-08-29 DIAGNOSIS — I6523 Occlusion and stenosis of bilateral carotid arteries: Secondary | ICD-10-CM | POA: Diagnosis not present

## 2022-08-29 DIAGNOSIS — I63532 Cerebral infarction due to unspecified occlusion or stenosis of left posterior cerebral artery: Secondary | ICD-10-CM

## 2022-08-29 NOTE — Patient Instructions (Addendum)
Your Plan:  Continue Topamax 50 mg twice daily for headache prevention  Continue aspirin and Crestor for secondary stroke prevention and continue close follow-up with PCP for aggressive stroke risk factor management  You will be contacted to schedule a carotid ultrasound for surveillance monitoring      Follow up in 6 months or call earlier if needed       Thank you for coming to see [place] at [facility]. I hope we have been able to provide you high quality care today.  You may receive a patient satisfaction survey over the next few weeks. We would appreciate your feedback and comments so that we may continue to improve ourselves and the health of our patients.

## 2022-09-03 ENCOUNTER — Encounter (INDEPENDENT_AMBULATORY_CARE_PROVIDER_SITE_OTHER): Payer: Medicare PPO | Admitting: Ophthalmology

## 2022-09-03 DIAGNOSIS — E113393 Type 2 diabetes mellitus with moderate nonproliferative diabetic retinopathy without macular edema, bilateral: Secondary | ICD-10-CM | POA: Diagnosis not present

## 2022-09-03 DIAGNOSIS — I10 Essential (primary) hypertension: Secondary | ICD-10-CM

## 2022-09-03 DIAGNOSIS — H35033 Hypertensive retinopathy, bilateral: Secondary | ICD-10-CM | POA: Diagnosis not present

## 2022-09-03 DIAGNOSIS — H43813 Vitreous degeneration, bilateral: Secondary | ICD-10-CM | POA: Diagnosis not present

## 2022-09-12 ENCOUNTER — Ambulatory Visit: Payer: No Typology Code available for payment source | Admitting: Adult Health

## 2022-09-12 ENCOUNTER — Ambulatory Visit (HOSPITAL_COMMUNITY)
Admission: RE | Admit: 2022-09-12 | Discharge: 2022-09-12 | Disposition: A | Discharge status: Home / Self Care | Payer: Medicare PPO | Source: Ambulatory Visit | Attending: Adult Health | Admitting: Adult Health

## 2022-09-12 DIAGNOSIS — I6523 Occlusion and stenosis of bilateral carotid arteries: Secondary | ICD-10-CM | POA: Diagnosis not present

## 2022-09-12 DIAGNOSIS — I6502 Occlusion and stenosis of left vertebral artery: Secondary | ICD-10-CM | POA: Diagnosis not present

## 2022-09-16 NOTE — Progress Notes (Signed)
Kindly inform patient that carotid ultrasound study shows no significant blockages of either carotid artery in the neck.

## 2022-10-05 ENCOUNTER — Encounter (HOSPITAL_COMMUNITY): Payer: Self-pay

## 2022-10-05 ENCOUNTER — Emergency Department (HOSPITAL_COMMUNITY)
Admission: EM | Admit: 2022-10-05 | Discharge: 2022-10-05 | Disposition: A | Discharge status: Home / Self Care | Payer: No Typology Code available for payment source | Attending: Emergency Medicine | Admitting: Emergency Medicine

## 2022-10-05 ENCOUNTER — Emergency Department (HOSPITAL_COMMUNITY): Payer: No Typology Code available for payment source

## 2022-10-05 ENCOUNTER — Other Ambulatory Visit: Payer: Self-pay

## 2022-10-05 DIAGNOSIS — I129 Hypertensive chronic kidney disease with stage 1 through stage 4 chronic kidney disease, or unspecified chronic kidney disease: Secondary | ICD-10-CM | POA: Insufficient documentation

## 2022-10-05 DIAGNOSIS — Z8673 Personal history of transient ischemic attack (TIA), and cerebral infarction without residual deficits: Secondary | ICD-10-CM | POA: Diagnosis not present

## 2022-10-05 DIAGNOSIS — E1122 Type 2 diabetes mellitus with diabetic chronic kidney disease: Secondary | ICD-10-CM | POA: Diagnosis not present

## 2022-10-05 DIAGNOSIS — N189 Chronic kidney disease, unspecified: Secondary | ICD-10-CM | POA: Diagnosis not present

## 2022-10-05 DIAGNOSIS — R569 Unspecified convulsions: Secondary | ICD-10-CM | POA: Insufficient documentation

## 2022-10-05 DIAGNOSIS — R531 Weakness: Secondary | ICD-10-CM | POA: Diagnosis not present

## 2022-10-05 DIAGNOSIS — I639 Cerebral infarction, unspecified: Secondary | ICD-10-CM | POA: Diagnosis not present

## 2022-10-05 DIAGNOSIS — N1831 Chronic kidney disease, stage 3a: Secondary | ICD-10-CM | POA: Diagnosis not present

## 2022-10-05 LAB — COMPREHENSIVE METABOLIC PANEL
ALT: 21 U/L (ref 0–44)
AST: 20 U/L (ref 15–41)
Albumin: 3.7 g/dL (ref 3.5–5.0)
Alkaline Phosphatase: 54 U/L (ref 38–126)
Anion gap: 10 (ref 5–15)
BUN: 27 mg/dL — ABNORMAL HIGH (ref 8–23)
CO2: 21 mmol/L — ABNORMAL LOW (ref 22–32)
Calcium: 8.6 mg/dL — ABNORMAL LOW (ref 8.9–10.3)
Chloride: 105 mmol/L (ref 98–111)
Creatinine, Ser: 1.53 mg/dL — ABNORMAL HIGH (ref 0.61–1.24)
GFR, Estimated: 48 mL/min — ABNORMAL LOW (ref >60)
Glucose, Bld: 137 mg/dL — ABNORMAL HIGH (ref 70–99)
Potassium: 4.2 mmol/L (ref 3.5–5.1)
Sodium: 136 mmol/L (ref 135–145)
Total Bilirubin: 0.5 mg/dL (ref 0.3–1.2)
Total Protein: 6.8 g/dL (ref 6.5–8.1)

## 2022-10-05 LAB — CBC WITH DIFFERENTIAL/PLATELET
Abs Immature Granulocytes: 0.02 10*3/uL (ref 0.00–0.07)
Basophils Absolute: 0 10*3/uL (ref 0.0–0.1)
Basophils Relative: 1 %
Eosinophils Absolute: 0.3 10*3/uL (ref 0.0–0.5)
Eosinophils Relative: 5 %
HCT: 45.1 % (ref 39.0–52.0)
Hemoglobin: 15.1 g/dL (ref 13.0–17.0)
Immature Granulocytes: 0 %
Lymphocytes Relative: 29 %
Lymphs Abs: 1.9 10*3/uL (ref 0.7–4.0)
MCH: 28.6 pg (ref 26.0–34.0)
MCHC: 33.5 g/dL (ref 30.0–36.0)
MCV: 85.4 fL (ref 80.0–100.0)
Monocytes Absolute: 0.7 10*3/uL (ref 0.1–1.0)
Monocytes Relative: 10 %
Neutro Abs: 3.6 10*3/uL (ref 1.7–7.7)
Neutrophils Relative %: 55 %
Platelets: 221 10*3/uL (ref 150–400)
RBC: 5.28 MIL/uL (ref 4.22–5.81)
RDW: 13.8 % (ref 11.5–15.5)
WBC: 6.5 10*3/uL (ref 4.0–10.5)
nRBC: 0 % (ref 0.0–0.2)

## 2022-10-05 LAB — CBG MONITORING, ED
Glucose-Capillary: 105 mg/dL — ABNORMAL HIGH (ref 70–99)
Glucose-Capillary: 133 mg/dL — ABNORMAL HIGH (ref 70–99)
Glucose-Capillary: 141 mg/dL — ABNORMAL HIGH (ref 70–99)

## 2022-10-05 NOTE — ED Notes (Signed)
THE PT HAD AN EPISODE EARLIER TODAY WHEN HE LOST CONSCIOUSNESS AND HIS WIFE THOUGHT THAT HIS BLOOD SUGAR DROPPED OUT THE BOTTOM  HE WAS SL CONFUSED  JUST AFTER A JERKING EPISODE  ALSO  AT PRESENT ALERT AND ORIENTED SKIN WARM AND DRY  NO DISTRESS

## 2022-10-05 NOTE — ED Triage Notes (Addendum)
Pt BIB [facility] from home d/t stroke-like s/s that pt & wife reports presented like the stroke he had back in 2020. Wife reported to EMS his Christinia Gully was 1445 then he began having an "episode" where he was staring, bil arm contractures, not verbalizing or responding on command. Then he slowly began responding, CBG was 66 at home then Fire CBG was 75 & then EMS CBG was 99. Pt reports he "spaced out & couldn't talk." States that he could hear the fireman talking but could not speak. EMS reports upon their arrival on scene at 1526 he was back at his baseline with no new deficits. Does have Rt sided deficits from past stroke in 2020. A/Ox4 upon arrival to ED, GCS 15, 20 Lt AC PIV, 116/66, 76 bpm, 20 resp, 97% on RA. Pt states he fears he may be having seizures.

## 2022-10-05 NOTE — ED Provider Notes (Signed)
[place] [facility]ERGENCY DEPARTMENT AT [facility] Provider Note  Medical Decision Making   HPI: Jeff [name] is a 72 y.o. 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 Patient arrived via EMS from home.  History provided by patient, wife.  No interpreter required for this encounter  Patient reports that he does not specifically recall episode.  Reports that he remembers sitting in his recliner, next thing he remembers he woke up and he felt that he had some with EMS present, denies any fevers, chills, chest pain, shortness of breath, nausea, vomiting, diarrhea, reports that he does have some baseline mild weakness in his right upper extremity and right lower extremity secondary to prior CVA.  Reports that he has never been diagnosed with seizures, reports that he has been worked up for this by his outpatient neurologist with [facility] neurology.  Wife at bedside states that patient was in his recliner, she noted that his blood sugar was low, he had a few sips of a Coca-Cola, and then slumped sideways and was having flailing movements of the bilateral upper extremities.  Reports that this went on for several minutes.  She called emergency services, fire arrived upon scene and found the patient to be hypoglycemic.  Dextrose was administered, and by the time of EMS arrival patient was back to baseline.  They report that the patient has continued to be at baseline while they were waiting in the ED.  Patient states that he is ready to go home.  ROS: As per HPI. Please see MAR for complete past medical history, surgical history, and social history.   Physical exam is pertinent for mild weakness and right upper and right lower extremity, at baseline per patient, otherwise no focal deficits  The differential includes but is not limited to stroke, seizure, TIA, hypoglycemia, electrolyte  derangement, AKI.  Additional history obtained from: Chart review, wife External records from outside source obtained and reviewed including: Reviewed patient's prior neurology notes, patient has been seen primarily for stroke by [facility] neurology, no documentation of known seizure disorder  ED provider interpretation of radiology/imaging: CT head with ventriculomegaly, no ICH, displaced fracture or loss of gray/white matter differentiation  Labs ordered were interpreted by myself as well as my attending and were incorporated into the medical decision making process for this patient.  ED provider interpretation of labs: CBC without leukocytosis, anemia, thrombocytopenia.  CMP without emergent electrolyte derangement, no AKI, creatinine of 1.5 is within patient's baseline, baseline range appears to be approximately 1.3-1.5.  CBG upon arrival 133, upon recheck 141.  Interventions: None  See the EMR for full details regarding lab and imaging results.  Well-appearing and at baseline on my exam.  Patient is fang D-, do not feel that patient requires activation as a code stroke.  Patient did not have any bladder incontinence, nor any tongue biting during the episode, overall description of episode sounds inconsistent with seizure-like activity, however discussed with patient and wife at bedside that unable to definitively rule in or rule out seizure-like activity unless spell is captured electroencephalographically.  Overall given there were no localizing symptoms reported, less consistent with TIA.  Per report patient was hypoglycemic during the episode, therefore could be related to hypoglycemia, patient's blood sugar was checked multiple times in the ED, and was appropriate in the low 100s.  CT head was performed prior to my evaluation and does not  demonstrate any overt mass lesion, midline shift, ICH.  Do feel that patient warrants screening CBC and CMP to evaluate for any particular electrolyte  derangement, these are reassuring.  Given patient is back to baseline, labs and exam are reassuring, feel that patient is appropriate for discharge and outpatient follow-up, patient and wife at bedside are amenable to this plan.  Discharged with plan for close follow-up with [facility] neurology.  Consults: None  Disposition: DISCHARGE: I believe that the patient is safe for discharge home with outpatient follow-up. Patient was informed of all pertinent physical exam, laboratory, and imaging findings. Patient's suspected etiology of their symptom presentation was discussed with the patient and all questions were answered. We discussed following up with PCP, neurology. I provided thorough ED return precautions. The patient feels safe and comfortable with this plan.  The plan for this patient was discussed with Dr. Jacqulyn Bath, who voiced agreement and who oversaw evaluation and treatment of this patient.  Clinical Impression:  1. Seizure-like activity [facility])    Discharge  Therapies: These medications and interventions were provided for the patient while in the ED. Medications - No data to display  MDM generated using voice dictation software and may contain dictation errors.  Please contact me for any clarification or with any questions.  Clinical Complexity A medically appropriate history, review of systems, and physical exam was performed.  Collateral history obtained from: Wife, chart review I personally reviewed the labs, EKG, imaging as discussed above. Patient's presentation is most consistent with acute complicated illness / injury requiring diagnostic workup Considered and ruled out life and body threatening conditions  Treatment: Outpatient follow-up Medications: None Discussed patient's care with providers from the following different specialties: None  Physical Exam   ED Triage Vitals  Enc Vitals Group     BP 10/05/22 1619 124/83     Pulse Rate 10/05/22 1619 75     Resp 10/05/22  1619 16     Temp 10/05/22 1619 97.9 F (36.6 C)     Temp Source 10/05/22 1619 Oral     SpO2 10/05/22 1619 96 %     Weight --      Height --      Head Circumference --      Peak Flow --      Pain Score 10/05/22 1628 0     Pain Loc --      Pain Edu? --      Excl. in GC? --      Physical Exam Vitals and nursing note reviewed.  Constitutional:      General: He is not in acute distress.    Appearance: He is well-developed. He is obese.  HENT:     Head: Normocephalic and atraumatic.  Eyes:     Extraocular Movements: Extraocular movements intact.     Conjunctiva/sclera: Conjunctivae normal.     Pupils: Pupils are equal, round, and reactive to light.  Cardiovascular:     Rate and Rhythm: Normal rate and regular rhythm.     Heart sounds: No murmur heard. Pulmonary:     Effort: Pulmonary effort is normal. No respiratory distress.     Breath sounds: Normal breath sounds.  Abdominal:     Palpations: Abdomen is soft.     Tenderness: There is no abdominal tenderness.  Musculoskeletal:        General: No swelling.     Cervical back: Neck supple.  Skin:    General: Skin is warm and dry.  Capillary Refill: Capillary refill takes less than 2 seconds.  Neurological:     Mental Status: He is alert and oriented to person, place, and time. Mental status is at baseline.     Cranial Nerves: No cranial nerve deficit.     Sensory: No sensory deficit.     Motor: Weakness (4+/5 in RHP consistent with patient's known baseline) present.     Coordination: Coordination normal.  Psychiatric:        Mood and Affect: Mood normal.       Procedure Note  Procedures  CT Head Wo Contrast  Final Result      Julianne Rice, MD Emergency Medicine, PGY-2   Curley Spice, MD 10/06/22 1914    Maia Plan, MD 10/09/22 1356

## 2022-10-05 NOTE — Discharge Instructions (Addendum)
You were seen in the emerged part today with seizure-like activity.  I would like you to coordinate with your neurologist on Monday for follow-up appointment in the coming week.  They may decide to do additional testing or start new medications.  If experience any new or suddenly worsening symptoms please return to the emergency department for reevaluation.

## 2022-10-18 ENCOUNTER — Encounter: Payer: Self-pay | Admitting: Neurology

## 2022-10-18 ENCOUNTER — Ambulatory Visit (INDEPENDENT_AMBULATORY_CARE_PROVIDER_SITE_OTHER): Payer: No Typology Code available for payment source | Admitting: Neurology

## 2022-10-18 VITALS — BP 136/73 | HR 81 | Ht 68.0 in | Wt 197.5 lb

## 2022-10-18 DIAGNOSIS — R404 Transient alteration of awareness: Secondary | ICD-10-CM | POA: Diagnosis not present

## 2022-10-18 DIAGNOSIS — R269 Unspecified abnormalities of gait and mobility: Secondary | ICD-10-CM | POA: Diagnosis not present

## 2022-10-18 NOTE — Progress Notes (Signed)
[facility]  PATIENT: Jeff [name] DOB: 12/18/1950  REQUESTING CLINICIAN: Anson Fret, MD HISTORY FROM: Patient, spouse and chart review  REASON FOR VISIT: Episode of unresponsiveness    HISTORICAL  CHIEF COMPLAINT:  Chief Complaint  Patient presents with   Hospitalization Follow-up    Rm12 wife susan present  Sz hospital followup    HISTORY OF PRESENT ILLNESS:  This is a 72 year old gentleman past medical history of hypertension, hyperlipidemia, diabetes mellitus, TIA, previous stroke who is presenting after being admitted to the hospital for an episode of altered mental status/unresponsiveness.  Patient reports being at home, sitting on recliner and his glucose monitor warned him for low blood sugar.  Wife gave him two sips of soda and then he became unresponsive.  Wife reports eyes were open, tongue rolled back, initially unresponsive to touch and he started flailing.  EMS was called, per chart review when EMS arrived he was still hypoglycemic and he received dextrose.  He was taken to the hospital.  His initial workup including head CT was negative.  Wife reports that he did have episodes similar but not this severe and those episodes were not related to low blood sugar.  He had an EEG a year ago which was negative for any epileptic epileptiform discharge but show mild diffuse slowing.  Since discharge from the hospital he is back to his normal self, no other complaints   Handedness: Right handed   Onset: May 3  Seizure Type: Episode of unresponsiveness  Current frequency: Once  Any injuries from seizures: None   Seizure risk factors: CVA   Previous ASMs: None   Currenty ASMs: None   ASMs side effects: Not applicable  Brain Images: Head CT with no acute  Previous EEGs: Mild diffuse slowing   OTHER MEDICAL CONDITIONS: Hypertension, hyperlipidemia, diabetes mellitus, TIA, previous stroke  REVIEW OF SYSTEMS: Full 14 system review of systems  performed and negative with exception of: As noted in the HPI   ALLERGIES: Allergies  Allergen Reactions   Flexeril [Cyclobenzaprine Hcl] Other (See Comments)    Causes him to pass out and lowers his BP   Lidocaine Swelling    throat   Novocain [Procaine Hcl] Swelling    HOME MEDICATIONS: Outpatient Medications Prior to Visit  Medication Sig Dispense Refill   acetaminophen (TYLENOL) 500 MG tablet Take 500-1,000 mg by mouth every 6 (six) hours as needed for headache.     aspirin EC 81 MG EC tablet Take 1 tablet (81 mg total) by mouth daily. Swallow whole. 30 tablet 11   Cholecalciferol (VITAMIN D) 50 MCG (2000 UT) tablet Take 2,000 Units by mouth daily.     empagliflozin (JARDIANCE) 25 MG TABS tablet TAKE ONE-HALF TABLET BY MOUTH EVERY MORNING FOR DIABETES; THIS REPLACES METFORMIN XR.     Glucagon 1 MG/0.2ML SOAJ Inject 1 mg into the skin as needed (low blood sugar).     glucose blood (ONETOUCH VERIO) test strip 1 each by Other route 4 (four) times daily -  before meals and at bedtime. dexcom 6     insulin aspart (NOVOLOG FLEXPEN) 100 UNIT/ML FlexPen 23 Units 3 (three) times daily as needed for high blood sugar. Sliding scale per endocrinologist     insulin glargine (LANTUS) 100 UNIT/ML injection Inject 0.45-0.55 mLs (45-55 Units total) into the skin 2 (two) times daily. Inject 45 units in the morning and 55 units at bedtime (Patient taking differently: Inject into the skin 2 (two) times daily. Inject 55  units in the morning and 45 units at bedtime)     Insulin Syringe-Needle U-100 (B-D INS SYR ULTRAFINE 1CC/31G) 31G X 5/16" 1 ML MISC Used to inject insulin twice daily. 90 each 2   levothyroxine (SYNTHROID) 50 MCG tablet Take 50 mcg by mouth daily before breakfast.     lisinopril (ZESTRIL) 2.5 MG tablet Take 1 tablet (2.5 mg total) by mouth daily.     Magnesium Oxide 420 MG TABS Take 1 tablet by mouth in the morning and at bedtime.     Multiple Vitamins-Minerals (ICAPS AREDS 2 PO) Take 1  capsule by mouth in the morning and at bedtime.     OZEMPIC, 1 MG/DOSE, 4 MG/3ML SOPN Inject into the skin.     pantoprazole (PROTONIX) 40 MG tablet Take 1 tablet (40 mg total) by mouth daily.     rosuvastatin (CRESTOR) 40 MG tablet TAKE 1 TABLET BY MOUTH DAILY 90 tablet 0   topiramate (TOPAMAX) 50 MG tablet Take 1 tablet (50 mg total) by mouth 2 (two) times daily. 180 tablet 1   No facility-administered medications prior to visit.    PAST MEDICAL HISTORY: Past Medical History:  Diagnosis Date   ADHD (attention deficit hyperactivity disorder)    Diabetes mellitus    Hyperlipidemia    Hypertension    Renal disorder    Seizures (HCC)    Stroke (HCC)     PAST SURGICAL HISTORY: Past Surgical History:  Procedure Laterality Date   IR ANGIO INTRA EXTRACRAN SEL COM CAROTID INNOMINATE BILAT MOD SED  01/03/2021   IR ANGIO VERTEBRAL SEL SUBCLAVIAN INNOMINATE UNI L MOD SED  01/03/2021   IR ANGIO VERTEBRAL SEL VERTEBRAL UNI R MOD SED  01/03/2021   IR RADIOLOGIST EVAL & MGMT  01/13/2021   IR US GUIDE VASC ACCESS RIGHT  01/03/2021   NECK SURGERY     SPINE SURGERY     Cervical spine x 2; Vear Clock; Critzer.    FAMILY HISTORY: Family History  Problem Relation Age of Onset   Heart disease Mother        CHF   Diabetes Mother    Heart disease Father    Cancer Brother     SOCIAL HISTORY: Social History   Socioeconomic History   Marital status: Married    Spouse name: [name]   Number of children: 2   Years of education: Not on file   Highest education level: Not on file  Occupational History   Occupation: Archivist  Tobacco Use   Smoking status: Never   Smokeless tobacco: Never  Vaping Use   Vaping Use: Never used  Substance and Sexual Activity   Alcohol use: No    Alcohol/week: 0.0 standard drinks of alcohol   Drug use: No   Sexual activity: Yes  Other Topics Concern   Not on file  Social History Narrative   Lives with wife   Social Determinants of Health    Financial Resource Strain: Not on file  Food Insecurity: Not on file  Transportation Needs: Not on file  Physical Activity: Not on file  Stress: Not on file  Social Connections: Not on file  Intimate Partner Violence: Not on file    PHYSICAL EXAM  GENERAL EXAM/CONSTITUTIONAL: Vitals:  Vitals:   10/18/22 1415  BP: 136/73  Pulse: 81  Weight: 197 lb 8 oz (89.6 kg)  Height: 5\' 8"  (1.727 m)   Body mass index is 30.03 kg/m. Wt Readings from Last 3 Encounters:  10/18/22 197  lb 8 oz (89.6 kg)  08/29/22 197 lb (89.4 kg)  03/13/22 201 lb 6.4 oz (91.4 kg)   Patient is in no distress; well developed, nourished and groomed; neck is supple  MUSCULOSKELETAL: Gait, strength, tone, movements noted in Neurologic exam below  NEUROLOGIC: MENTAL STATUS:      No data to display         awake, alert, oriented to person, place and time recent and remote memory intact normal attention and concentration language fluent, comprehension intact, naming intact fund of knowledge appropriate  CRANIAL NERVE:  2nd, 3rd, 4th, 6th - Visual fields full to confrontation, extraocular muscles intact, no nystagmus 5th - facial sensation symmetric 7th - facial strength symmetric 8th - hearing intact 9th - palate elevates symmetrically, uvula midline 11th - shoulder shrug symmetric 12th - tongue protrusion midline  MOTOR:  normal bulk and tone, full strength in the BUE, BLE  SENSORY:  normal and symmetric to light touch  COORDINATION:  finger-nose-finger, fine finger movements normal  GAIT/STATION:  Difficulty getting up from a seated position, unsteady, wide based, small steps    DIAGNOSTIC DATA (LABS, IMAGING, TESTING) - I reviewed patient records, labs, notes, testing and imaging myself where available.  Lab Results  Component Value Date   WBC 6.5 10/05/2022   HGB 15.1 10/05/2022   HCT 45.1 10/05/2022   MCV 85.4 10/05/2022   PLT 221 10/05/2022      Component Value Date/Time    NA 136 10/05/2022 2146   NA 139 07/29/2018 0947   K 4.2 10/05/2022 2146   CL 105 10/05/2022 2146   CO2 21 (L) 10/05/2022 2146   GLUCOSE 137 (H) 10/05/2022 2146   BUN 27 (H) 10/05/2022 2146   BUN 13 07/29/2018 0947   CREATININE 1.53 (H) 10/05/2022 2146   CREATININE 1.30 (H) 08/17/2015 1557   CALCIUM 8.6 (L) 10/05/2022 2146   PROT 6.8 10/05/2022 2146   PROT 7.3 07/29/2018 0947   ALBUMIN 3.7 10/05/2022 2146   ALBUMIN 4.6 07/29/2018 0947   AST 20 10/05/2022 2146   ALT 21 10/05/2022 2146   ALKPHOS 54 10/05/2022 2146   BILITOT 0.5 10/05/2022 2146   BILITOT 0.4 07/29/2018 0947   GFRNONAA 48 (L) 10/05/2022 2146   GFRNONAA 57 (L) 11/22/2013 1730   GFRAA 58 (L) 08/25/2018 0557   GFRAA 66 11/22/2013 1730   Lab Results  Component Value Date   CHOL 96 01/02/2021   HDL 25 (L) 01/02/2021   LDLCALC 41 01/02/2021   TRIG 152 (H) 01/02/2021   Lab Results  Component Value Date   HGBA1C 8.6 (H) 01/02/2021   No results found for: "VITAMINB12" Lab Results  Component Value Date   TSH 1.915 02/27/2014    Head CT 10/05/2022 No acute intracranial abnormality.   EEG 12/2021: Mild diffuse slowing   I personally reviewed brain Images and previous EEG reports.   ASSESSMENT AND PLAN  72 y.o. year old male  with history hypertension, hyperlipidemia, diabetes mellitus, TIA, previous stroke who is presenting after an episode of unresponsiveness in the setting of hypoglycemia.  Because he had 2 previous similar episodes, we will repeat routine EEG to rule out seizure; episode likely might be also related to hypoglycemia.  I will contact the patient to go over the result. For his gait abnormality, I will refer him to PT for gait training.  Both patient and spouse, comfortable with plan.  Continue to follow with PCP and return as scheduled in September.    1.  Transient alteration of awareness   2. Gait abnormality     Patient Instructions  Routine EEG, I will contact you to go over the  result Referral to PT for gait training Continue to follow with PCP Follow up as scheduled with Shanda Bumps in September.   Per [facility] statutes, patients with seizures are not allowed to drive until they have been seizure-free for six months.  Other recommendations include using caution when using heavy equipment or power tools. Avoid working on ladders or at heights. Take showers instead of baths.  Do not swim alone.  Ensure the water temperature is not too high on the home water heater. Do not go swimming alone. Do not lock yourself in a room alone (i.e. bathroom). When caring for infants or small children, sit down when holding, feeding, or changing them to minimize risk of injury to the child in the event you have a seizure. Maintain good sleep hygiene. Avoid alcohol.  Also recommend adequate sleep, hydration, good diet and minimize stress.   During the Seizure  - First, ensure adequate ventilation and place patients on the floor on their left side  Loosen clothing around the neck and ensure the airway is patent. If the patient is clenching the teeth, do not force the mouth open with any object as this can cause severe damage - Remove all items from the surrounding that can be hazardous. The patient may be oblivious to what's happening and may not even know what he or she is doing. If the patient is confused and wandering, either gently guide him/her away and block access to outside areas - Reassure the individual and be comforting - Call 911. In most cases, the seizure ends before EMS arrives. However, there are cases when seizures may last over 3 to 5 minutes. Or the individual may have developed breathing difficulties or severe injuries. If a pregnant patient or a person with diabetes develops a seizure, it is prudent to call an ambulance. - Finally, if the patient does not regain full consciousness, then call EMS. Most patients will remain confused for about 45 to 90 minutes after a  seizure, so you must use judgment in calling for help. - Avoid restraints but make sure the patient is in a bed with padded side rails - Place the individual in a lateral position with the neck slightly flexed; this will help the saliva drain from the mouth and prevent the tongue from falling backward - Remove all nearby furniture and other hazards from the area - Provide verbal assurance as the individual is regaining consciousness - Provide the patient with privacy if possible - Call for help and start treatment as ordered by the caregiver   After the Seizure (Postictal Stage)  After a seizure, most patients experience confusion, fatigue, muscle pain and/or a headache. Thus, one should permit the individual to sleep. For the next few days, reassurance is essential. Being calm and helping reorient the person is also of importance.  Most seizures are painless and end spontaneously. Seizures are not harmful to others but can lead to complications such as stress on the lungs, brain and the heart. Individuals with prior lung problems may develop labored breathing and respiratory distress.     Orders Placed This Encounter  Procedures   Ambulatory referral to Physical Therapy   EEG adult    No orders of the defined types were placed in this encounter.   No follow-ups on file.    Windell Norfolk, MD  10/19/2022, 10:07 AM  [facility] [address] [place], [place] [address] [phone]

## 2022-10-19 ENCOUNTER — Encounter: Payer: Self-pay | Admitting: Neurology

## 2022-10-19 NOTE — Patient Instructions (Signed)
Routine EEG, I will contact you to go over the result Referral to PT for gait training Continue to follow with PCP Follow up as scheduled with Shanda Bumps in September.

## 2022-10-26 ENCOUNTER — Ambulatory Visit: Payer: Medicare PPO | Attending: Neurology | Admitting: Physical Therapy

## 2022-10-26 VITALS — BP 121/80 | HR 89

## 2022-10-26 DIAGNOSIS — R2689 Other abnormalities of gait and mobility: Secondary | ICD-10-CM | POA: Diagnosis not present

## 2022-10-26 DIAGNOSIS — M6281 Muscle weakness (generalized): Secondary | ICD-10-CM | POA: Insufficient documentation

## 2022-10-26 DIAGNOSIS — R404 Transient alteration of awareness: Secondary | ICD-10-CM | POA: Insufficient documentation

## 2022-10-26 DIAGNOSIS — R2681 Unsteadiness on feet: Secondary | ICD-10-CM | POA: Insufficient documentation

## 2022-10-26 NOTE — Therapy (Signed)
OUTPATIENT PHYSICAL THERAPY NEURO EVALUATION   Patient Name: Jeff [name] MRN: [id number] DOB:May 16, 1951, 72 y.o., male Today's Date: 10/26/2022   PCP: Anson Fret, MD REFERRING PROVIDER: Windell Norfolk, MD  END OF SESSION:  PT End of Session - 10/26/22 4098     Visit Number 1    Number of Visits 13   with eval   Date for PT Re-Evaluation 12/21/22    Authorization Type [employer]    Progress Note Due on Visit 10    PT Start Time 0922    PT Stop Time 1015    PT Time Calculation (min) 53 min    Equipment Utilized During Treatment Gait belt    Activity Tolerance Treatment limited secondary to medical complications (Comment)   lightheadedness/dizziness   Behavior During Therapy WFL for tasks assessed/performed             Past Medical History:  Diagnosis Date   ADHD (attention deficit hyperactivity disorder)    Diabetes mellitus    Hyperlipidemia    Hypertension    Renal disorder    Seizures (HCC)    Stroke [facility])    Past Surgical History:  Procedure Laterality Date   IR ANGIO INTRA EXTRACRAN SEL COM CAROTID INNOMINATE BILAT MOD SED  01/03/2021   IR ANGIO VERTEBRAL SEL SUBCLAVIAN INNOMINATE UNI L MOD SED  01/03/2021   IR ANGIO VERTEBRAL SEL VERTEBRAL UNI R MOD SED  01/03/2021   IR RADIOLOGIST EVAL & MGMT  01/13/2021   IR US GUIDE VASC ACCESS RIGHT  01/03/2021   NECK SURGERY     SPINE SURGERY     Cervical spine x 2; Vear Clock; Critzer.   Patient Active Problem List   Diagnosis Date Noted   TIA (transient ischemic attack) 03/10/2020   CKD (chronic kidney disease) 08/28/2018   Retinopathy of both eyes 08/28/2018   Stroke due to embolism of left vertebral artery (HCC) 08/12/2018   Stroke (HCC) 08/08/2018   Diabetes mellitus (HCC) 07/25/2015   Neck injury 06/22/2015   Hearing loss in right ear 10/14/2011   Agent orange exposure 10/14/2011   ADD (attention deficit disorder) 10/14/2011   Hyperlipidemia 10/14/2011   BMI 28.0-28.9,adult 10/14/2011    ONSET DATE:  10/19/2022  REFERRING DIAG: R40.4 (ICD-10-CM) - Transient alteration of awareness  THERAPY DIAG:  Other abnormalities of gait and mobility  Unsteadiness on feet  Muscle weakness (generalized)  Rationale for Evaluation and Treatment: Rehabilitation  SUBJECTIVE:  SUBJECTIVE STATEMENT: Pt recounts his recent visit to the hospital, states he is unsure if he has been having seizures. Pt reports he has an EEG scheduled 11/08/22. Pt reports overall being unsteady, not very stable on his feet, states, "my balance isn't real good". Pt does also recount his history of a stroke (2020) with residual R-sided weakness as well as peripheral neuropathy from his diabetes. Pt asking about diabetic shoes to improve his balance.  Pt accompanied by: self and significant other wife Jeff [name]  PERTINENT HISTORY: PMH: hypertension, hyperlipidemia, diabetes mellitus, TIA, previous stroke (March 2020), h/o cervical fusion C5-7 (2012), h/o neck injury 2016, stage 3 CKD  Per Dr. [name] note 10/18/22: HISTORY OF PRESENT ILLNESS:  This is a 72 year old gentleman past medical history of hypertension, hyperlipidemia, diabetes mellitus, TIA, previous stroke who is presenting after being admitted to the hospital for an episode of altered mental status/unresponsiveness.  Patient reports being at home, sitting on recliner and his glucose monitor warned him for low blood sugar.  Wife gave him two sips of soda and then he became unresponsive.  Wife reports eyes were open, tongue rolled back, initially unresponsive to touch and he started flailing.  EMS was called, per chart review when EMS arrived he was still hypoglycemic and he received dextrose.  He was taken to the hospital.  His initial workup including head CT was negative.  Wife reports that he  did have episodes similar but not this severe and those episodes were not related to low blood sugar.  He had an EEG a year ago which was negative for any epileptic epileptiform discharge but show mild diffuse slowing.  Since discharge from the hospital he is back to his normal self, no other complaints.   PAIN:  Are you having pain? No  PRECAUTIONS: Fall  WEIGHT BEARING RESTRICTIONS: No  FALLS: Has patient fallen in last 6 months? Yes. Number of falls several but less than one per month; no injuries with falls, able to get back up independently; unsure of cause "just lost my balance"  LIVING ENVIRONMENT: Lives with: lives with their family Lives in: House/apartment Stairs: Yes: External: 2 steps; none (uses his wife for support when navigating stairs) Has following equipment at home: Single point cane, Walker - 2 wheeled, Wheelchair (manual), and Ramped entry  PLOF: Independent with gait and Independent with transfers  PATIENT GOALS: "I'd like to have better balance, walk better"  OBJECTIVE:   DIAGNOSTIC FINDINGS:  Pending EEG on 11/08/22  Head CT 10/05/22 after taken to hospital for AMS IMPRESSION: No acute intracranial abnormality.  COGNITION: Overall cognitive status: Within functional limits for tasks assessed   SENSATION: Light touch: Impaired  Impaired light touch in feet  COORDINATION: WFL  POSTURE: No Significant postural limitations   LOWER EXTREMITY MMT:    MMT Right Eval Left Eval  Hip flexion 3 4  Hip extension    Hip abduction    Hip adduction    Hip internal rotation    Hip external rotation    Knee flexion 3 4  Knee extension 3 4  Ankle dorsiflexion 2- 4  Ankle plantarflexion    Ankle inversion    Ankle eversion    (Blank rows = not tested)  BED MOBILITY:  Mod I per pt report  TRANSFERS: Assistive device utilized: None  Sit to stand: Modified independence (increased time, needs to push up from arms of chair) Stand to sit: Modified  independence Chair to chair: Modified independence Floor:  not assessed  at eval   GAIT: Gait pattern: decreased hip/knee flexion- Right, decreased ankle dorsiflexion- Right, and poor foot clearance- Right Distance walked: various clinic distances Assistive device utilized: None Level of assistance: CGA Comments: ongoing RLE impairments from CVA in 2020 as noted above  FUNCTIONAL TESTS:   [facility] PT Assessment - 10/26/22 0940       Ambulation/Gait   Gait velocity 32.8 ft over 18.88 sec = 1.74 ft/sec      Standardized Balance Assessment   Standardized Balance Assessment Berg Balance Test;Timed Up and Go Test;Five Times Sit to Stand    Five times sit to stand comments  13.53 sec   use of B armrests of chair     Timed Up and Go Test   TUG Normal TUG    Normal TUG (seconds) 15.72   no AD     Functional Gait  Assessment   Gait assessed  Yes    Gait Level Surface Walks 20 ft in less than 7 sec but greater than 5.5 sec, uses assistive device, slower speed, mild gait deviations, or deviates 6-10 in outside of the 12 in walkway width.    Change in Gait Speed Makes only minor adjustments to walking speed, or accomplishes a change in speed with significant gait deviations, deviates 10-15 in outside the 12 in walkway width, or changes speed but loses balance but is able to recover and continue walking.    Gait with Horizontal Head Turns Performs head turns with moderate changes in gait velocity, slows down, deviates 10-15 in outside 12 in walkway width but recovers, can continue to walk.    Gait with Vertical Head Turns Performs task with severe disruption of gait (eg, staggers 15 in outside 12 in walkway width, loses balance, stops, reaches for wall).    Gait and Pivot Turn Turns slowly, requires verbal cueing, or requires several small steps to catch balance following turn and stop    Step Over Obstacle Cannot perform without assistance.    Gait with Eyes Closed Cannot walk 20 ft without  assistance, severe gait deviations or imbalance, deviates greater than 15 in outside 12 in walkway width or will not attempt task.    Ambulating Backwards Cannot walk 20 ft without assistance, severe gait deviations or imbalance, deviates greater than 15 in outside 12 in walkway width or will not attempt task.            Unable to complete FGA assessment due to onset of dizziness/lightheadedness. BP assessed at noted below (in seated and in standing) and symptoms do somewhat resolve with seated rest break but deferred further assessment due to pt becoming symptomatic again upon attempts to perform remainder of exam.   TODAY'S TREATMENT:                                                                                                                               Vitals:   10/26/22 1004 10/26/22 1005  BP:  138/84 121/80  Pulse: 86 89     PATIENT EDUCATION: Education details: Eval findings, PT POC Person educated: Patient and Spouse Education method: Explanation Education comprehension: verbalized understanding and needs further education  HOME EXERCISE PROGRAM: To be initiated  GOALS: Goals reviewed with patient? No  SHORT TERM GOALS: Target date: 11/16/2022  Pt will be independent with initial HEP for improved strength, balance, transfers and gait. Baseline: Goal status: INITIAL  2.  FGA assessment to be completed and STG set Baseline:  Goal status: INITIAL  3.  Pt will improve gait velocity to at least 2.0 ft/sec for improved gait efficiency and performance at SBA level  Baseline: 1.74 ft/sec CGA (5/24) Goal status: INITIAL   LONG TERM GOALS: Target date: 12/07/2022   Pt will be independent with final HEP for improved strength, balance, transfers and gait. Baseline:  Goal status: INITIAL  2.  FGA assessment to be completed and LTG set Baseline:  Goal status: INITIAL  3.  Pt will improve gait velocity to at least 2.25 ft/sec for improved gait efficiency and  performance at SBA level  Baseline: 1.74 ft/sec CGA (5/24) Goal status: INITIAL  4.  Pt will improve normal TUG to less than or equal to 13 seconds for improved functional mobility and decreased fall risk. Baseline: 15.72 sec (5/24) Goal status: INITIAL  5.  mCTSIB to be assessed and LTG set Baseline:  Goal status: INITIAL   ASSESSMENT:  CLINICAL IMPRESSION: Patient is a 72 year old male referred to Neuro OPPT for gait abnormalities and frequent falls.   Pt's PMH is significant for: hypertension, hyperlipidemia, diabetes mellitus, TIA, previous stroke (March 2020), h/o cervical fusion C5-7 (2012), h/o neck injury 2016, stage 3 CKD. The following deficits were present during the exam: decreased LE strength, decreased sensation, and impaired balance. Based on his gait speed of 1.74 ft/sec, TUG score of 15.72, and balance impairments noted on FGA so far, pt is an increased risk for falls. Pt would benefit from skilled PT to address these impairments and functional limitations to maximize functional mobility independence.   OBJECTIVE IMPAIRMENTS: Abnormal gait, decreased activity tolerance, decreased balance, decreased knowledge of condition, difficulty walking, decreased strength, impaired perceived functional ability, and impaired sensation.   ACTIVITY LIMITATIONS: stairs and transfers  PARTICIPATION LIMITATIONS: driving and community activity  PERSONAL FACTORS: Age, Time since onset of injury/illness/exacerbation, and 3+ comorbidities:    hypertension, hyperlipidemia, diabetes mellitus, TIA, previous stroke (March 2020), h/o cervical fusion C5-7 (2012), h/o neck injury 2016, stage 3 CKD are also affecting patient's functional outcome.   REHAB POTENTIAL: Good  CLINICAL DECISION MAKING: Stable/uncomplicated  EVALUATION COMPLEXITY: Moderate  PLAN:  PT FREQUENCY: 2x/week  PT DURATION: 6 weeks  PLANNED INTERVENTIONS: Therapeutic exercises, Therapeutic activity, Neuromuscular  re-education, Balance training, Gait training, Patient/Family education, Self Care, Joint mobilization, Stair training, Vestibular training, Canalith repositioning, Visual/preceptual remediation/compensation, Orthotic/Fit training, DME instructions, Aquatic Therapy, Electrical stimulation, Manual therapy, and Re-evaluation  PLAN FOR NEXT SESSION: monitor BP and symptoms of dizziness/lightheadedness; finish FGA and write STG/LTG; assess vestibular system?, assess mCTSIB and write LTG; VERY UNSAFE WITH EYES CLOSED DURING GAIT-GUARD CLOSELY   Peter Congo, PT, DPT, CSRS 10/26/2022, 10:16 AM

## 2022-10-30 ENCOUNTER — Ambulatory Visit: Payer: Medicare PPO | Admitting: Physical Therapy

## 2022-10-30 ENCOUNTER — Encounter: Payer: Self-pay | Admitting: Physical Therapy

## 2022-10-30 DIAGNOSIS — R2681 Unsteadiness on feet: Secondary | ICD-10-CM | POA: Diagnosis not present

## 2022-10-30 DIAGNOSIS — M6281 Muscle weakness (generalized): Secondary | ICD-10-CM

## 2022-10-30 DIAGNOSIS — R404 Transient alteration of awareness: Secondary | ICD-10-CM | POA: Diagnosis not present

## 2022-10-30 DIAGNOSIS — R2689 Other abnormalities of gait and mobility: Secondary | ICD-10-CM | POA: Diagnosis not present

## 2022-10-30 NOTE — Therapy (Signed)
OUTPATIENT PHYSICAL THERAPY NEURO TREATMENT NOTE   Patient Name: Jeff [name] MRN: [id number] DOB:11/19/1950, 72 y.o., male Today's Date: 10/30/2022   PCP: Anson Fret, MD REFERRING PROVIDER: Windell Norfolk, MD  END OF SESSION:  PT End of Session - 10/30/22 1807     Visit Number 2    Number of Visits 13   with eval   Date for PT Re-Evaluation 12/21/22    Authorization Type [employer]    Progress Note Due on Visit 10    PT Start Time 1533    PT Stop Time 1620    PT Time Calculation (min) 47 min    Equipment Utilized During Treatment Gait belt    Activity Tolerance Patient tolerated treatment well   lightheadedness/dizziness   Behavior During Therapy WFL for tasks assessed/performed              Past Medical History:  Diagnosis Date   ADHD (attention deficit hyperactivity disorder)    Diabetes mellitus    Hyperlipidemia    Hypertension    Renal disorder    Seizures (HCC)    Stroke [facility])    Past Surgical History:  Procedure Laterality Date   IR ANGIO INTRA EXTRACRAN SEL COM CAROTID INNOMINATE BILAT MOD SED  01/03/2021   IR ANGIO VERTEBRAL SEL SUBCLAVIAN INNOMINATE UNI L MOD SED  01/03/2021   IR ANGIO VERTEBRAL SEL VERTEBRAL UNI R MOD SED  01/03/2021   IR RADIOLOGIST EVAL & MGMT  01/13/2021   IR US GUIDE VASC ACCESS RIGHT  01/03/2021   NECK SURGERY     SPINE SURGERY     Cervical spine x 2; Vear Clock; Critzer.   Patient Active Problem List   Diagnosis Date Noted   TIA (transient ischemic attack) 03/10/2020   CKD (chronic kidney disease) 08/28/2018   Retinopathy of both eyes 08/28/2018   Stroke due to embolism of left vertebral artery (HCC) 08/12/2018   Stroke (HCC) 08/08/2018   Diabetes mellitus (HCC) 07/25/2015   Neck injury 06/22/2015   Hearing loss in right ear 10/14/2011   Agent orange exposure 10/14/2011   ADD (attention deficit disorder) 10/14/2011   Hyperlipidemia 10/14/2011   BMI 28.0-28.9,adult 10/14/2011    ONSET DATE: 10/19/2022  REFERRING  DIAG: R40.4 (ICD-10-CM) - Transient alteration of awareness  THERAPY DIAG:  Other abnormalities of gait and mobility  Unsteadiness on feet  Muscle weakness (generalized)  Rationale for Evaluation and Treatment: Rehabilitation  SUBJECTIVE:  SUBJECTIVE STATEMENT:  Pt states he feels good today - better than he did on Friday at eval; checks blood sugar monitor - reading 167; says he had seizure like episode May 3 - paramedics were called; pt states he changed into his tennis shoes (from dress shoes) just prior to start of session  Pt accompanied by: self and significant other wife Jeff [name]  PERTINENT HISTORY: PMH: hypertension, hyperlipidemia, diabetes mellitus, TIA, previous stroke (March 2020), h/o cervical fusion C5-7 (2012), h/o neck injury 2016, stage 3 CKD  Per Dr. [name] note 10/18/22: HISTORY OF PRESENT ILLNESS:  This is a 72 year old gentleman past medical history of hypertension, hyperlipidemia, diabetes mellitus, TIA, previous stroke who is presenting after being admitted to the hospital for an episode of altered mental status/unresponsiveness.  Patient reports being at home, sitting on recliner and his glucose monitor warned him for low blood sugar.  Wife gave him two sips of soda and then he became unresponsive.  Wife reports eyes were open, tongue rolled back, initially unresponsive to touch and he started flailing.  EMS was called, per chart review when EMS arrived he was still hypoglycemic and he received dextrose.  He was taken to the hospital.  His initial workup including head CT was negative.  Wife reports that he did have episodes similar but not this severe and those episodes were not related to low blood sugar.  He had an EEG a year ago which was negative for any epileptic epileptiform  discharge but show mild diffuse slowing.  Since discharge from the hospital he is back to his normal self, no other complaints.   PAIN:  Are you having pain? No  PRECAUTIONS: Fall  WEIGHT BEARING RESTRICTIONS: No  FALLS: Has patient fallen in last 6 months? Yes. Number of falls several but less than one per month; no injuries with falls, able to get back up independently; unsure of cause "just lost my balance"  LIVING ENVIRONMENT: Lives with: lives with their family Lives in: House/apartment Stairs: Yes: External: 2 steps; none (uses his wife for support when navigating stairs) Has following equipment at home: Single point cane, Environmental consultant - 2 wheeled, Wheelchair (manual), and Ramped entry  PLOF: Independent with gait and Independent with transfers  PATIENT GOALS: "I'd like to have better balance, walk better"  OBJECTIVE:   TRANSFERS: Assistive device utilized: None  Sit to stand: Modified independence (increased time, needs to push up from arms of chair) Stand to sit: Modified independence  GAIT: Gait pattern: decreased hip/knee flexion- Right, decreased ankle dorsiflexion- Right, and poor foot clearance- Right Distance walked: various clinic distances Assistive device utilized: None Level of assistance: CGA Comments: ongoing RLE impairments from CVA in 2020 as noted above  FUNCTIONAL TESTS:     [facility] PT Assessment - 10/30/22 0001       Functional Gait  Assessment   Gait Level Surface Walks 20 ft, slow speed, abnormal gait pattern, evidence for imbalance or deviates 10-15 in outside of the 12 in walkway width. Requires more than 7 sec to ambulate 20 ft.    Change in Gait Speed Makes only minor adjustments to walking speed, or accomplishes a change in speed with significant gait deviations, deviates 10-15 in outside the 12 in walkway width, or changes speed but loses balance but is able to recover and continue walking.    Gait with Horizontal Head Turns Performs head turns with  moderate changes in gait velocity, slows down, deviates 10-15 in outside 12 in walkway width but recovers,  can continue to walk.    Gait with Vertical Head Turns Performs task with severe disruption of gait (eg, staggers 15 in outside 12 in walkway width, loses balance, stops, reaches for wall).    Gait and Pivot Turn Turns slowly, requires verbal cueing, or requires several small steps to catch balance following turn and stop    Step Over Obstacle Cannot perform without assistance.    Gait with Eyes Closed Cannot walk 20 ft without assistance, severe gait deviations or imbalance, deviates greater than 15 in outside 12 in walkway width or will not attempt task.    Ambulating Backwards Walks 20 ft, slow speed, abnormal gait pattern, evidence for imbalance, deviates 10-15 in outside 12 in walkway width.            Tandem walking = 0 (unable)   Steps 2 - pt required use of hand rail     Total score = 7/30  (unable to get entire FGA to pull into note - last 2 items scored separately from template)   TODAY'S TREATMENT:                                                                                                                                BP 124/82 at start of session   mCTSIB:  condition 1 = 30 secs with mild postural sway;  Condition 2 = 9.7 secs with EC:  Condition 3 =  11 secs:  Condition 4 = 0  secs (unable)  Pt performed exercises listed below for HEP;  pt stood in corner with feet close together with EO for 15 sec hold; then added horizontal head turns 5 reps and then vertical head turns 5 reps with CGA - pt was instructed to perform in corner with chair in front at home  Access Code: [email or web address] URL: [email or web address] Date: 10/30/2022 Prepared by: Maebelle Munroe  Exercises - Standing Hip Flexion with Counter Support  - 1 x daily - 7 x weekly - 1 sets - 10 reps - Standing March with Counter Support  - 1 x daily - 7 x weekly - 3 sets - 10 reps - Standing Hip  Extension with Counter Support  - 1 x daily - 7 x weekly - 3 sets - 10 reps - Standing Hip Abduction with Counter Support  - 1 x daily - 7 x weekly - 1 sets - 10 reps - Standing with Eyes Open  - 1 x daily - 7 x weekly - 1 sets - 5 reps - Sit to Stand with Counter Support  - 1 x daily - 7 x weekly - 1 sets - 10 reps   PATIENT EDUCATION: Education details:  [employer] [id number]; discussed need for use of assistive device (SPC) for increased safety with ambulation -pt reluctant to use device at this time Person educated: Patient and Spouse Education method: Explanation, demonstration and handouts Education comprehension: verbalized understanding and needs further education  HOME EXERCISE  PROGRAM: To be initiated  GOALS: Goals reviewed with patient? No  SHORT TERM GOALS: Target date: 11/16/2022  Pt will be independent with initial HEP for improved strength, balance, transfers and gait. Baseline: Goal status: INITIAL  2.  FGA assessment to be completed and STG set;  Increase FGA score to >/=11/30  Baseline:  Goal status: INITIAL  3.  Pt will improve gait velocity to at least 2.0 ft/sec for improved gait efficiency and performance at SBA level  Baseline: 1.74 ft/sec CGA (5/24) Goal status: INITIAL   LONG TERM GOALS: Target date: 12/07/2022   Pt will be independent with final HEP for improved strength, balance, transfers and gait. Baseline:  Goal status: INITIAL  2.  FGA assessment to be completed and LTG set; increased FGA score to >/= 15/30 to increase safety with gait. Baseline:  Goal status: INITIAL  3.  Pt will improve gait velocity to at least 2.25 ft/sec for improved gait efficiency and performance at SBA level  Baseline: 1.74 ft/sec CGA (5/24) Goal status: INITIAL  4.  Pt will improve normal TUG to less than or equal to 13 seconds for improved functional mobility and decreased fall risk. Baseline: 15.72 sec (5/24) Goal status: INITIAL  5.  mCTSIB to be assessed and  LTG set;  maintain balance on condition 3 to >/= 20 secs to improve standing balance on compliant surfaces.  Baseline: pt unable to stand with EC on compliant or noncompliant surfaces Goal status: INITIAL   ASSESSMENT:  CLINICAL IMPRESSION: PT session focused on further balance and gait assessment with pt scoring 7/30 on FGA and unable to maintain balance with EC on compliant or noncompliant surfaces due to apparent vestibular hypofunction and also due to peripheral neuropathy.  Pt is at very high risk of fall due to FGA score of 7/30; pt drags RLE but is able to lift leg and increase foot clearance with verbal cues.  SPC was recommended but pt is reluctant to use assistive device at this time.  Remainder of session focused on establishing HEP.     OBJECTIVE IMPAIRMENTS: Abnormal gait, decreased activity tolerance, decreased balance, decreased knowledge of condition, difficulty walking, decreased strength, impaired perceived functional ability, and impaired sensation.   ACTIVITY LIMITATIONS: stairs and transfers  PARTICIPATION LIMITATIONS: driving and community activity  PERSONAL FACTORS: Age, Time since onset of injury/illness/exacerbation, and 3+ comorbidities:    hypertension, hyperlipidemia, diabetes mellitus, TIA, previous stroke (March 2020), h/o cervical fusion C5-7 (2012), h/o neck injury 2016, stage 3 CKD are also affecting patient's functional outcome.   REHAB POTENTIAL: Good  CLINICAL DECISION MAKING: Stable/uncomplicated  EVALUATION COMPLEXITY: Moderate  PLAN:  PT FREQUENCY: 2x/week  PT DURATION: 6 weeks  PLANNED INTERVENTIONS: Therapeutic exercises, Therapeutic activity, Neuromuscular re-education, Balance training, Gait training, Patient/Family education, Self Care, Joint mobilization, Stair training, Vestibular training, Canalith repositioning, Visual/preceptual remediation/compensation, Orthotic/Fit training, DME instructions, Aquatic Therapy, Electrical stimulation,  Manual therapy, and Re-evaluation   PLAN FOR NEXT SESSION:  check HEP for any questions or problems; cont with balance and gait training    Jazalyn Mondor, Donavan Burnet, PT 10/30/2022, 6:10 PM

## 2022-11-01 ENCOUNTER — Ambulatory Visit: Payer: Medicare PPO | Admitting: Physical Therapy

## 2022-11-01 ENCOUNTER — Encounter: Payer: Self-pay | Admitting: Physical Therapy

## 2022-11-01 DIAGNOSIS — M6281 Muscle weakness (generalized): Secondary | ICD-10-CM | POA: Diagnosis not present

## 2022-11-01 DIAGNOSIS — R2689 Other abnormalities of gait and mobility: Secondary | ICD-10-CM | POA: Diagnosis not present

## 2022-11-01 DIAGNOSIS — R2681 Unsteadiness on feet: Secondary | ICD-10-CM

## 2022-11-01 DIAGNOSIS — R404 Transient alteration of awareness: Secondary | ICD-10-CM | POA: Diagnosis not present

## 2022-11-01 NOTE — Therapy (Signed)
OUTPATIENT PHYSICAL THERAPY NEURO TREATMENT NOTE   Patient Name: Jeff [name] MRN: [id number] DOB:1950-07-22, 72 y.o., male Today's Date: 11/01/2022   PCP: Anson Fret, MD REFERRING PROVIDER: Windell Norfolk, MD  END OF SESSION:  PT End of Session - 11/01/22 1620     Visit Number 3    Number of Visits 13   with eval   Date for PT Re-Evaluation 12/21/22    Authorization Type [employer]    Progress Note Due on Visit 10    PT Start Time 1447    PT Stop Time 1530    PT Time Calculation (min) 43 min    Activity Tolerance Patient tolerated treatment well   lightheadedness/dizziness   Behavior During Therapy WFL for tasks assessed/performed               Past Medical History:  Diagnosis Date   ADHD (attention deficit hyperactivity disorder)    Diabetes mellitus    Hyperlipidemia    Hypertension    Renal disorder    Seizures (HCC)    Stroke [facility])    Past Surgical History:  Procedure Laterality Date   IR ANGIO INTRA EXTRACRAN SEL COM CAROTID INNOMINATE BILAT MOD SED  01/03/2021   IR ANGIO VERTEBRAL SEL SUBCLAVIAN INNOMINATE UNI L MOD SED  01/03/2021   IR ANGIO VERTEBRAL SEL VERTEBRAL UNI R MOD SED  01/03/2021   IR RADIOLOGIST EVAL & MGMT  01/13/2021   IR US GUIDE VASC ACCESS RIGHT  01/03/2021   NECK SURGERY     SPINE SURGERY     Cervical spine x 2; Vear Clock; Critzer.   Patient Active Problem List   Diagnosis Date Noted   TIA (transient ischemic attack) 03/10/2020   CKD (chronic kidney disease) 08/28/2018   Retinopathy of both eyes 08/28/2018   Stroke due to embolism of left vertebral artery (HCC) 08/12/2018   Stroke (HCC) 08/08/2018   Diabetes mellitus (HCC) 07/25/2015   Neck injury 06/22/2015   Hearing loss in right ear 10/14/2011   Agent orange exposure 10/14/2011   ADD (attention deficit disorder) 10/14/2011   Hyperlipidemia 10/14/2011   BMI 28.0-28.9,adult 10/14/2011    ONSET DATE: 10/19/2022  REFERRING DIAG: R40.4 (ICD-10-CM) - Transient alteration  of awareness  THERAPY DIAG:  Other abnormalities of gait and mobility  Unsteadiness on feet  Muscle weakness (generalized)  Rationale for Evaluation and Treatment: Rehabilitation  SUBJECTIVE:                                                                                                                                                                                             SUBJECTIVE STATEMENT:  Pt accompanied to PT by his wife, [name]; she states she had pt put on his sneakers (changed from dress shoes) before leaving home; pt catches Rt toes on floor when ambulating from lobby back to clinic gym, requiring min to mod assist for balance recovery - pt states "these shoes are going to kill me".  Pt reports he did not do any exercises in HEP yesterday.   Pt accompanied by: self and significant other wife [name]  PERTINENT HISTORY: PMH: hypertension, hyperlipidemia, diabetes mellitus, TIA, previous stroke (March 2020), h/o cervical fusion C5-7 (2012), h/o neck injury 2016, stage 3 CKD  Per Dr. [name] note 10/18/22: HISTORY OF PRESENT ILLNESS:  This is a 72 year old gentleman past medical history of hypertension, hyperlipidemia, diabetes mellitus, TIA, previous stroke who is presenting after being admitted to the hospital for an episode of altered mental status/unresponsiveness.  Patient reports being at home, sitting on recliner and his glucose monitor warned him for low blood sugar.  Wife gave him two sips of soda and then he became unresponsive.  Wife reports eyes were open, tongue rolled back, initially unresponsive to touch and he started flailing.  EMS was called, per chart review when EMS arrived he was still hypoglycemic and he received dextrose.  He was taken to the hospital.  His initial workup including head CT was negative.  Wife reports that he did have episodes similar but not this severe and those episodes were not related to low blood sugar.  He had an EEG a year ago which was  negative for any epileptic epileptiform discharge but show mild diffuse slowing.  Since discharge from the hospital he is back to his normal self, no other complaints.   PAIN:  Are you having pain? No  PRECAUTIONS: Fall  WEIGHT BEARING RESTRICTIONS: No  FALLS: Has patient fallen in last 6 months? Yes. Number of falls several but less than one per month; no injuries with falls, able to get back up independently; unsure of cause "just lost my balance"  LIVING ENVIRONMENT: Lives with: lives with their family Lives in: House/apartment Stairs: Yes: External: 2 steps; none (uses his wife for support when navigating stairs) Has following equipment at home: Single point cane, Environmental consultant - 2 wheeled, Wheelchair (manual), and Ramped entry  PLOF: Independent with gait and Independent with transfers  PATIENT GOALS: "I'd like to have better balance, walk better"  OBJECTIVE:   Today's treatment: 11-01-22  TRANSFERS: Assistive device utilized: None  Sit to stand: Modified independence (increased time, needs to push up from arms of chair) Stand to sit: Modified independence  GAIT: Gait pattern: decreased hip/knee flexion- Right, decreased ankle dorsiflexion- Right, and poor foot clearance- Right Distance walked: various clinic distances; pt amb. With min assist approx. 125' at end of session from gym to car (pulled up to front door) - after seizure-like episode Assistive device utilized: None Level of assistance: CGA - min assist with balance recovery when Rt foot/toes catch on floor Comments: ongoing RLE impairments from CVA in 2020 as noted above  THEREX: Bridging 5 reps - pt reported no difficulty with this exercise Bridging with LLE extension 5 reps;  Rt unilateral bridging 5 reps Rt SLR 10 reps - no weight - cues to lift to height of Lt flexed knee and lower with control Rt hip abduction 10 reps in Lt sidelying Rt clamshell exercise - 2# weight for resistance - in Lt sidelying 10 reps Rt  hip extension control exercise with 2# weight - lifting leg off  side of mat and lifting back up onto mat 10 reps with cues to keep knee in neutral alignment Sit to stand from mat table - 1 rep without UE support but with wide stance:  instructed pt to bring feet closer together (shoulder width apart) - pt performed 3 reps with RUE support;   NeuroRe-ed: Pt performed standing balance exercises as instructed for HEP; at // bars with UE support Standing hip abduction alternating legs 10 reps Standing hip extension alternating 10 reps Standing hip flexion alternating 10 reps; marching 10 reps each leg with LUE support on // bars Tap ups to 6" step with RUE support on outside of // bars - 10 reps each leg Performed stepping strategy exercise - stepping forward/back with RLE with cues to step with initial heel contact rather than forefoot contact; performed stepping laterally RLE 5 reps with min hand held assist;  pt then performed stepping forward/back and laterally 5 reps each with LLE for improved SLS on RLE 3 colored discs placed on floor - pt held // bar with LUE - used as targets - pt touched each disc in 1/2 circle pattern with each foot 3 reps each with min assist for balance                                                                                                                 Pt had seizure like episode at end of session when he was seated on mat, waiting on HEP to be put to be put together by PT & printed; wife present and stated that this is what they have been going through and reported that he would become labile after this episode.  Pt blanked out for approx. 10 secs -she held his hands after he regained consciousness and he did become labile (crying);  she assured him it was going to be alright - pt rested for approx. 5" and was assisted with walking to car, as he refused use of wheelchair   Added strengthening exs. To HEP:  11-01-22 Access Code: [email or web address] URL:  [email or web address] Date: 11/01/2022 Prepared by: Maebelle Munroe  Exercises 11-01-22 - Supine Active Straight Leg Raise  - 1 x daily - 7 x weekly - 1 sets - 10 reps - Sidelying Hip Abduction  - 1 x daily - 7 x weekly - 1 sets - 10 reps - Clamshell with Resistance  - 1 x daily - 7 x weekly - 1 sets - 10 reps - 3 sec hold - Supine Knee to Chest with Leg Straight  - 1 x daily - 7 x weekly - 1 sets - 10 reps   Access Code: [email or web address]  10-30-22 URL: [email or web address] Date: 10/30/2022 Prepared by: Maebelle Munroe  Exercises - Standing Hip Flexion with Counter Support  - 1 x daily - 7 x weekly - 1 sets - 10 reps - Standing March with Counter Support  - 1 x daily - 7 x weekly - 3 sets - 10 reps - Standing Hip Extension with Counter Support  -  1 x daily - 7 x weekly - 3 sets - 10 reps - Standing Hip Abduction with Counter Support  - 1 x daily - 7 x weekly - 1 sets - 10 reps - Standing with Eyes Open  - 1 x daily - 7 x weekly - 1 sets - 5 reps - Sit to Stand with Counter Support  - 1 x daily - 7 x weekly - 1 sets - 10 reps   PATIENT EDUCATION: Education details:  [employer] [id number]; discussed need for use of assistive device (SPC) for increased safety with ambulation -pt reluctant to use device at this time Person educated: Patient and Spouse Education method: Explanation, demonstration and handouts Education comprehension: verbalized understanding and needs further education  HOME EXERCISE PROGRAM: To be initiated  GOALS: Goals reviewed with patient? No  SHORT TERM GOALS: Target date: 11/16/2022  Pt will be independent with initial HEP for improved strength, balance, transfers and gait. Baseline: Goal status: INITIAL  2.  FGA assessment to be completed and STG set;  Increase FGA score to >/=11/30  Baseline:  Goal status: INITIAL  3.  Pt will improve gait velocity to at least 2.0 ft/sec for improved gait efficiency and performance at SBA level  Baseline:  1.74 ft/sec CGA (5/24) Goal status: INITIAL   LONG TERM GOALS: Target date: 12/07/2022   Pt will be independent with final HEP for improved strength, balance, transfers and gait. Baseline:  Goal status: INITIAL  2.  FGA assessment to be completed and LTG set; increased FGA score to >/= 15/30 to increase safety with gait. Baseline:  Goal status: INITIAL  3.  Pt will improve gait velocity to at least 2.25 ft/sec for improved gait efficiency and performance at SBA level  Baseline: 1.74 ft/sec CGA (5/24) Goal status: INITIAL  4.  Pt will improve normal TUG to less than or equal to 13 seconds for improved functional mobility and decreased fall risk. Baseline: 15.72 sec (5/24) Goal status: INITIAL  5.  mCTSIB to be assessed and LTG set;  maintain balance on condition 3 to >/= 20 secs to improve standing balance on compliant surfaces.  Baseline: pt unable to stand with EC on compliant or noncompliant surfaces Goal status: INITIAL   ASSESSMENT:  CLINICAL IMPRESSION: PT session focused on RLE strengthening, standing balance and coordination exercises for RLE.  Pt wearing HOKA sneakers to PT session today but was very unsafe as he had episode of Rt toes catching on floor, requiring min to mod assist for balance recovery.  Instructed wife to bring these shoes to next session but allow pt to wear dress shoes for increased safety with gait.  Pt continues to decline use of assistive device for assistance with ambulation; pt is at very high risk for fall without assistive device.  Pt had seizure like episode at end of session while seated on mat, waiting for HEP to be prepared. Wife present and assured him everything would be alright, as she was familiar with this occurrence and predicted the lability that would occur after episode was over.  Pt was assisted with ambulation to car as he refused to use wheelchair for exiting building after session.  Cont with POC.    OBJECTIVE IMPAIRMENTS: Abnormal  gait, decreased activity tolerance, decreased balance, decreased knowledge of condition, difficulty walking, decreased strength, impaired perceived functional ability, and impaired sensation.   ACTIVITY LIMITATIONS: stairs and transfers  PARTICIPATION LIMITATIONS: driving and community activity  PERSONAL FACTORS: Age, Time since onset of injury/illness/exacerbation, and  3+ comorbidities:    hypertension, hyperlipidemia, diabetes mellitus, TIA, previous stroke (March 2020), h/o cervical fusion C5-7 (2012), h/o neck injury 2016, stage 3 CKD are also affecting patient's functional outcome.   REHAB POTENTIAL: Good  CLINICAL DECISION MAKING: Stable/uncomplicated  EVALUATION COMPLEXITY: Moderate  PLAN:  PT FREQUENCY: 2x/week  PT DURATION: 6 weeks  PLANNED INTERVENTIONS: Therapeutic exercises, Therapeutic activity, Neuromuscular re-education, Balance training, Gait training, Patient/Family education, Self Care, Joint mobilization, Stair training, Vestibular training, Canalith repositioning, Visual/preceptual remediation/compensation, Orthotic/Fit training, DME instructions, Aquatic Therapy, Electrical stimulation, Manual therapy, and Re-evaluation   PLAN FOR NEXT SESSION:   Pt to BRING sneakers to session and wear dress shoes - trial AFO RLE and assess need; check HEP for any ?'s or problems;  cont balance and gait training    Tamecca Artiga, Donavan Burnet, PT 11/01/2022, 6:59 PM

## 2022-11-06 ENCOUNTER — Ambulatory Visit: Payer: Medicare PPO | Attending: Neurology | Admitting: Physical Therapy

## 2022-11-06 DIAGNOSIS — R2689 Other abnormalities of gait and mobility: Secondary | ICD-10-CM | POA: Diagnosis not present

## 2022-11-06 DIAGNOSIS — M6281 Muscle weakness (generalized): Secondary | ICD-10-CM | POA: Insufficient documentation

## 2022-11-06 DIAGNOSIS — I69351 Hemiplegia and hemiparesis following cerebral infarction affecting right dominant side: Secondary | ICD-10-CM | POA: Insufficient documentation

## 2022-11-06 DIAGNOSIS — R2681 Unsteadiness on feet: Secondary | ICD-10-CM | POA: Insufficient documentation

## 2022-11-06 DIAGNOSIS — R293 Abnormal posture: Secondary | ICD-10-CM | POA: Insufficient documentation

## 2022-11-06 NOTE — Therapy (Signed)
OUTPATIENT PHYSICAL THERAPY NEURO TREATMENT NOTE   Patient Name: Jeff [name] MRN: [id number] DOB:01-17-1951, 72 y.o., male Today's Date: 11/06/2022   PCP: Anson Fret, MD REFERRING PROVIDER: Windell Norfolk, MD  END OF SESSION:  PT End of Session - 11/06/22 0850     Visit Number 4    Number of Visits 13   with eval   Date for PT Re-Evaluation 12/21/22    Authorization Type [employer]    Progress Note Due on Visit 10    PT Start Time 0845    PT Stop Time 0930    PT Time Calculation (min) 45 min    Equipment Utilized During Treatment Gait belt    Activity Tolerance Patient tolerated treatment well   lightheadedness/dizziness   Behavior During Therapy WFL for tasks assessed/performed                Past Medical History:  Diagnosis Date   ADHD (attention deficit hyperactivity disorder)    Diabetes mellitus    Hyperlipidemia    Hypertension    Renal disorder    Seizures (HCC)    Stroke [facility])    Past Surgical History:  Procedure Laterality Date   IR ANGIO INTRA EXTRACRAN SEL COM CAROTID INNOMINATE BILAT MOD SED  01/03/2021   IR ANGIO VERTEBRAL SEL SUBCLAVIAN INNOMINATE UNI L MOD SED  01/03/2021   IR ANGIO VERTEBRAL SEL VERTEBRAL UNI R MOD SED  01/03/2021   IR RADIOLOGIST EVAL & MGMT  01/13/2021   IR US GUIDE VASC ACCESS RIGHT  01/03/2021   NECK SURGERY     SPINE SURGERY     Cervical spine x 2; Vear Clock; Critzer.   Patient Active Problem List   Diagnosis Date Noted   TIA (transient ischemic attack) 03/10/2020   CKD (chronic kidney disease) 08/28/2018   Retinopathy of both eyes 08/28/2018   Stroke due to embolism of left vertebral artery (HCC) 08/12/2018   Stroke (HCC) 08/08/2018   Diabetes mellitus (HCC) 07/25/2015   Neck injury 06/22/2015   Hearing loss in right ear 10/14/2011   Agent orange exposure 10/14/2011   ADD (attention deficit disorder) 10/14/2011   Hyperlipidemia 10/14/2011   BMI 28.0-28.9,adult 10/14/2011    ONSET DATE:  10/19/2022  REFERRING DIAG: R40.4 (ICD-10-CM) - Transient alteration of awareness  THERAPY DIAG:  Other abnormalities of gait and mobility  Unsteadiness on feet  Muscle weakness (generalized)  Hemiplegia and hemiparesis following cerebral infarction affecting right dominant side (HCC)  Abnormal posture  Rationale for Evaluation and Treatment: Rehabilitation  SUBJECTIVE:  SUBJECTIVE STATEMENT:  Pt reports no more episodes of "zoning out" since last week. Pt has been doing his HEP. Pt wearing dress shoes today because his feet catch when he wears tennis shoes.  Pt accompanied by: self and significant other wife Jeff [name]  PERTINENT HISTORY: PMH: hypertension, hyperlipidemia, diabetes mellitus, TIA, previous stroke (March 2020), h/o cervical fusion C5-7 (2012), h/o neck injury 2016, stage 3 CKD  Per Dr. [name] note 10/18/22: HISTORY OF PRESENT ILLNESS:  This is a 72 year old gentleman past medical history of hypertension, hyperlipidemia, diabetes mellitus, TIA, previous stroke who is presenting after being admitted to the hospital for an episode of altered mental status/unresponsiveness.  Patient reports being at home, sitting on recliner and his glucose monitor warned him for low blood sugar.  Wife gave him two sips of soda and then he became unresponsive.  Wife reports eyes were open, tongue rolled back, initially unresponsive to touch and he started flailing.  EMS was called, per chart review when EMS arrived he was still hypoglycemic and he received dextrose.  He was taken to the hospital.  His initial workup including head CT was negative.  Wife reports that he did have episodes similar but not this severe and those episodes were not related to low blood sugar.  He had an EEG a year ago which was negative  for any epileptic epileptiform discharge but show mild diffuse slowing.  Since discharge from the hospital he is back to his normal self, no other complaints.   PAIN:  Are you having pain? No  PRECAUTIONS: Fall  WEIGHT BEARING RESTRICTIONS: No  FALLS: Has patient fallen in last 6 months? Yes. Number of falls several but less than one per month; no injuries with falls, able to get back up independently; unsure of cause "just lost my balance"  LIVING ENVIRONMENT: Lives with: lives with their family Lives in: House/apartment Stairs: Yes: External: 2 steps; none (uses his wife for support when navigating stairs) Has following equipment at home: Single point cane, Environmental consultant - 2 wheeled, Wheelchair (manual), and Ramped entry  PLOF: Independent with gait and Independent with transfers  PATIENT GOALS: "I'd like to have better balance, walk better"  OBJECTIVE:    Gait Red light/green light x 115 ft with CGA with no issues catching balance with sudden stops, does tend to drag RLE along the floor with gait.  Gait pattern: decreased hip/knee flexion- Right, decreased ankle dorsiflexion- Right, and poor foot clearance- Right Distance walked: 115 ft Assistive device utilized: None Level of assistance: CGA Comments: following blocked practice of increasing step length, still scoots RLE along the floor  Gait pattern: decreased hip/knee flexion- Right Distance walked: 115 ft Assistive device utilized: None Level of assistance: CGA Comments: focus on heel strike during gait, improved RLE clearance  R foot drag likely due to decreased attention to limb along with onset of weakness with onset of fatigue  Gait pattern: decreased hip/knee flexion- Right and decreased ankle dorsiflexion- Right Distance walked: 115 ft Assistive device utilized: None Level of assistance: CGA Comments: with 4# ankle weights  Gait pattern: decreased hip/knee flexion- Right and decreased ankle dorsiflexion-  Right Distance walked: 115 ft Assistive device utilized: None Level of assistance: CGA Comments: following removal of 4# ankle weight, improved RLE step height and limb clearance with ongoing cues for heel strike   NMR Forward/backward 4" beam step overs with RLE 3 x 10 reps with min A for balance Pt exhibits good clearance of RLE overall and good ability to  recover balance, does fatigue quickly  Ambulation over 4" hurdles with 4# ankle weight on RLE Initially with some circumduction and not enough hip flexion to clear hurdle, improves as task progresses  Ambuation over 4# hurdles unweighted with decreased control of RLE initially then progression to improved step height    PATIENT EDUCATION: Education details: added walking program to [employer] Person educated: Patient and Spouse Education method: Explanation, demonstration and handouts Education comprehension: verbalized understanding and needs further education  HOME EXERCISE PROGRAM: Access Code: [email or web address] URL: [email or web address] Date: 10/30/2022 Prepared by: Maebelle Munroe  Exercises - Standing Hip Flexion with Counter Support  - 1 x daily - 7 x weekly - 1 sets - 10 reps - Standing March with Counter Support  - 1 x daily - 7 x weekly - 3 sets - 10 reps - Standing Hip Extension with Counter Support  - 1 x daily - 7 x weekly - 3 sets - 10 reps - Standing Hip Abduction with Counter Support  - 1 x daily - 7 x weekly - 1 sets - 10 reps - Standing with Eyes Open  - 1 x daily - 7 x weekly - 1 sets - 5 reps - Sit to Stand with Counter Support  - 1 x daily - 7 x weekly - 1 sets - 10 reps - Supine Active Straight Leg Raise  - 1 x daily - 7 x weekly - 1 sets - 10 reps - Sidelying Hip Abduction  - 1 x daily - 7 x weekly - 1 sets - 10 reps - Clamshell with Resistance  - 1 x daily - 7 x weekly - 1 sets - 10 reps - 3 sec hold - Supine Knee to Chest with Leg Straight  - 1 x daily - 7 x weekly - 1 sets - 10 reps  Walking  Program: Walk with your family. Walk during cooler times of the day. Starting this week, walk 1 min per day for 7 days. Following week add 1 minutes to your total time. Week 1: 1 min Week 2: 2 min Week 3: 3 min Week 4: 4 min...Marland KitchenMarland KitchenUntil you can walk to 15 min per day    GOALS: Goals reviewed with patient? No  SHORT TERM GOALS: Target date: 11/16/2022  Pt will be independent with initial HEP for improved strength, balance, transfers and gait. Baseline: Goal status: INITIAL  2.  FGA assessment to be completed and STG set;  Increase FGA score to >/=11/30  Baseline:  Goal status: INITIAL  3.  Pt will improve gait velocity to at least 2.0 ft/sec for improved gait efficiency and performance at SBA level  Baseline: 1.74 ft/sec CGA (5/24) Goal status: INITIAL   LONG TERM GOALS: Target date: 12/07/2022   Pt will be independent with final HEP for improved strength, balance, transfers and gait. Baseline:  Goal status: INITIAL  2.  FGA assessment to be completed and LTG set; increased FGA score to >/= 15/30 to increase safety with gait. Baseline:  Goal status: INITIAL  3.  Pt will improve gait velocity to at least 2.25 ft/sec for improved gait efficiency and performance at SBA level  Baseline: 1.74 ft/sec CGA (5/24) Goal status: INITIAL  4.  Pt will improve normal TUG to less than or equal to 13 seconds for improved functional mobility and decreased fall risk. Baseline: 15.72 sec (5/24) Goal status: INITIAL  5.  mCTSIB to be assessed and LTG set;  maintain balance on condition 3 to >/= 20 secs to  improve standing balance on compliant surfaces.  Baseline: pt unable to stand with EC on compliant or noncompliant surfaces Goal status: INITIAL   ASSESSMENT:  CLINICAL IMPRESSION: Emphasis of skilled PT session on continuing to work on improving RLE step height and limb clearance during gait for decreased fall risk. Unable to assess bracing this date as pt did not bring tennis shoes,  plan to assess next session. Pt exhibits good strength in his RLE but does exhibit decreased attention to limb leading to above-mentioned gait impairments. With cues to focus on R heel strike pt does exhibit improved limb clearance during gait and decreased fall risk. Pt continues to benefit from skilled therapy services to work towards improving his balance, improving his RLE control, and decreasing his fall risk. Continue POC.    OBJECTIVE IMPAIRMENTS: Abnormal gait, decreased activity tolerance, decreased balance, decreased knowledge of condition, difficulty walking, decreased strength, impaired perceived functional ability, and impaired sensation.   ACTIVITY LIMITATIONS: stairs and transfers  PARTICIPATION LIMITATIONS: driving and community activity  PERSONAL FACTORS: Age, Time since onset of injury/illness/exacerbation, and 3+ comorbidities:    hypertension, hyperlipidemia, diabetes mellitus, TIA, previous stroke (March 2020), h/o cervical fusion C5-7 (2012), h/o neck injury 2016, stage 3 CKD are also affecting patient's functional outcome.   REHAB POTENTIAL: Good  CLINICAL DECISION MAKING: Stable/uncomplicated  EVALUATION COMPLEXITY: Moderate  PLAN:  PT FREQUENCY: 2x/week  PT DURATION: 6 weeks  PLANNED INTERVENTIONS: Therapeutic exercises, Therapeutic activity, Neuromuscular re-education, Balance training, Gait training, Patient/Family education, Self Care, Joint mobilization, Stair training, Vestibular training, Canalith repositioning, Visual/preceptual remediation/compensation, Orthotic/Fit training, DME instructions, Aquatic Therapy, Electrical stimulation, Manual therapy, and Re-evaluation   PLAN FOR NEXT SESSION:   Pt to BRING sneakers to session and wear dress shoes - trial AFO RLE and assess need; check HEP for any ?'s or problems;  cont balance and gait training, try foot up brace, needs to leave a little early for EEG appt    Peter Congo, PT, DPT, CSRS 11/06/2022, 9:32  AM

## 2022-11-08 ENCOUNTER — Ambulatory Visit: Payer: Medicare PPO | Admitting: Physical Therapy

## 2022-11-08 ENCOUNTER — Ambulatory Visit (INDEPENDENT_AMBULATORY_CARE_PROVIDER_SITE_OTHER): Payer: Medicare PPO | Admitting: Neurology

## 2022-11-08 DIAGNOSIS — R4182 Altered mental status, unspecified: Secondary | ICD-10-CM

## 2022-11-08 DIAGNOSIS — R2681 Unsteadiness on feet: Secondary | ICD-10-CM | POA: Diagnosis not present

## 2022-11-08 DIAGNOSIS — R293 Abnormal posture: Secondary | ICD-10-CM

## 2022-11-08 DIAGNOSIS — R2689 Other abnormalities of gait and mobility: Secondary | ICD-10-CM | POA: Diagnosis not present

## 2022-11-08 DIAGNOSIS — M6281 Muscle weakness (generalized): Secondary | ICD-10-CM | POA: Diagnosis not present

## 2022-11-08 DIAGNOSIS — I69351 Hemiplegia and hemiparesis following cerebral infarction affecting right dominant side: Secondary | ICD-10-CM | POA: Diagnosis not present

## 2022-11-08 DIAGNOSIS — R404 Transient alteration of awareness: Secondary | ICD-10-CM

## 2022-11-08 NOTE — Therapy (Signed)
OUTPATIENT PHYSICAL THERAPY NEURO TREATMENT NOTE   Patient Name: Jeff [name] MRN: [id number] DOB:05-Jan-1951, 72 y.o., male Today's Date: 11/08/2022   PCP: Anson Fret, MD REFERRING PROVIDER: Windell Norfolk, MD  END OF SESSION:  PT End of Session - 11/08/22 1148     Visit Number 5    Number of Visits 13   with eval   Date for PT Re-Evaluation 12/21/22    Authorization Type [employer]    Progress Note Due on Visit 10    PT Start Time 1145    PT Stop Time 1227    PT Time Calculation (min) 42 min    Equipment Utilized During Treatment Gait belt    Activity Tolerance Patient tolerated treatment well   lightheadedness/dizziness   Behavior During Therapy WFL for tasks assessed/performed                 Past Medical History:  Diagnosis Date   ADHD (attention deficit hyperactivity disorder)    Diabetes mellitus    Hyperlipidemia    Hypertension    Renal disorder    Seizures (HCC)    Stroke [facility])    Past Surgical History:  Procedure Laterality Date   IR ANGIO INTRA EXTRACRAN SEL COM CAROTID INNOMINATE BILAT MOD SED  01/03/2021   IR ANGIO VERTEBRAL SEL SUBCLAVIAN INNOMINATE UNI L MOD SED  01/03/2021   IR ANGIO VERTEBRAL SEL VERTEBRAL UNI R MOD SED  01/03/2021   IR RADIOLOGIST EVAL & MGMT  01/13/2021   IR US GUIDE VASC ACCESS RIGHT  01/03/2021   NECK SURGERY     SPINE SURGERY     Cervical spine x 2; Vear Clock; Critzer.   Patient Active Problem List   Diagnosis Date Noted   TIA (transient ischemic attack) 03/10/2020   CKD (chronic kidney disease) 08/28/2018   Retinopathy of both eyes 08/28/2018   Stroke due to embolism of left vertebral artery (HCC) 08/12/2018   Stroke (HCC) 08/08/2018   Diabetes mellitus (HCC) 07/25/2015   Neck injury 06/22/2015   Hearing loss in right ear 10/14/2011   Agent orange exposure 10/14/2011   ADD (attention deficit disorder) 10/14/2011   Hyperlipidemia 10/14/2011   BMI 28.0-28.9,adult 10/14/2011    ONSET DATE:  10/19/2022  REFERRING DIAG: R40.4 (ICD-10-CM) - Transient alteration of awareness  THERAPY DIAG:  Other abnormalities of gait and mobility  Unsteadiness on feet  Muscle weakness (generalized)  Abnormal posture  Rationale for Evaluation and Treatment: Rehabilitation  SUBJECTIVE:  SUBJECTIVE STATEMENT:  Pt had a near fall this morning, caught himself with the wall. Pt does bring in tennis shoes to trial foot up brace this session, wearing dress shoes in/out of appointment.  Pt accompanied by: self and significant other wife [name]  PERTINENT HISTORY: PMH: hypertension, hyperlipidemia, diabetes mellitus, TIA, previous stroke (March 2020), h/o cervical fusion C5-7 (2012), h/o neck injury 2016, stage 3 CKD  Per Dr. [name] note 10/18/22: HISTORY OF PRESENT ILLNESS:  This is a 72 year old gentleman past medical history of hypertension, hyperlipidemia, diabetes mellitus, TIA, previous stroke who is presenting after being admitted to the hospital for an episode of altered mental status/unresponsiveness.  Patient reports being at home, sitting on recliner and his glucose monitor warned him for low blood sugar.  Wife gave him two sips of soda and then he became unresponsive.  Wife reports eyes were open, tongue rolled back, initially unresponsive to touch and he started flailing.  EMS was called, per chart review when EMS arrived he was still hypoglycemic and he received dextrose.  He was taken to the hospital.  His initial workup including head CT was negative.  Wife reports that he did have episodes similar but not this severe and those episodes were not related to low blood sugar.  He had an EEG a year ago which was negative for any epileptic epileptiform discharge but show mild diffuse slowing.  Since discharge  from the hospital he is back to his normal self, no other complaints.   PAIN:  Are you having pain? No  PRECAUTIONS: Fall  WEIGHT BEARING RESTRICTIONS: No  FALLS: Has patient fallen in last 6 months? Yes. Number of falls several but less than one per month; no injuries with falls, able to get back up independently; unsure of cause "just lost my balance"  LIVING ENVIRONMENT: Lives with: lives with their family Lives in: House/apartment Stairs: Yes: External: 2 steps; none (uses his wife for support when navigating stairs) Has following equipment at home: Single point cane, Environmental consultant - 2 wheeled, Wheelchair (manual), and Ramped entry  PLOF: Independent with gait and Independent with transfers  PATIENT GOALS: "I'd like to have better balance, walk better"  OBJECTIVE:   Gait Gait pattern: decreased hip/knee flexion- Right and decreased ankle dorsiflexion- Right Distance walked: 115 ft Assistive device utilized: None Level of assistance: CGA Comments: gait with tennis shoes with RLE foot up brace, L foot does catch occasionally; adjusted brace to increase pull and pt exhibits improved RLE clearance and no instances of L foot catching on the floor though he is attending to his L limb more with gait   TherAct To work on SLS stability: Blaze pods Random 6 configuration with focus on alt LE tapping with min A for balance Pt scores 20 hits, 29 hits before onset of dizziness so further attempts at this activity deferred  To work on increasing R hip and knee flexion and ankle DF during gait: Obstacle course: Stepping over 4" beam and foam beam; 30 sec balance on airex with Romberg stance Pt needs min A for balance while navigating obstacle course, needs cues to decrease RLE circumduction   PATIENT EDUCATION: Education details: continue HEP and walking program, handout for where to purchase foot up brace Person educated: Patient and Spouse Education method: Explanation, demonstration and  handouts Education comprehension: verbalized understanding and needs further education  HOME EXERCISE PROGRAM: Access Code: [email or web address] URL: [email or web address] Date: 10/30/2022 Prepared by: Maebelle Munroe  Exercises - Standing Hip Flexion with Counter Support  -  1 x daily - 7 x weekly - 1 sets - 10 reps - Standing March with Counter Support  - 1 x daily - 7 x weekly - 3 sets - 10 reps - Standing Hip Extension with Counter Support  - 1 x daily - 7 x weekly - 3 sets - 10 reps - Standing Hip Abduction with Counter Support  - 1 x daily - 7 x weekly - 1 sets - 10 reps - Standing with Eyes Open  - 1 x daily - 7 x weekly - 1 sets - 5 reps - Sit to Stand with Counter Support  - 1 x daily - 7 x weekly - 1 sets - 10 reps - Supine Active Straight Leg Raise  - 1 x daily - 7 x weekly - 1 sets - 10 reps - Sidelying Hip Abduction  - 1 x daily - 7 x weekly - 1 sets - 10 reps - Clamshell with Resistance  - 1 x daily - 7 x weekly - 1 sets - 10 reps - 3 sec hold - Supine Knee to Chest with Leg Straight  - 1 x daily - 7 x weekly - 1 sets - 10 reps  Walking Program: Walk with your family. Walk during cooler times of the day. Starting this week, walk 1 min per day for 7 days. Following week add 1 minutes to your total time. Week 1: 1 min Week 2: 2 min Week 3: 3 min Week 4: 4 min...Marland KitchenMarland KitchenUntil you can walk to 15 min per day    GOALS: Goals reviewed with patient? No  SHORT TERM GOALS: Target date: 11/16/2022  Pt will be independent with initial HEP for improved strength, balance, transfers and gait. Baseline: Goal status: INITIAL  2.  FGA assessment to be completed and STG set;  Increase FGA score to >/=11/30  Baseline:  Goal status: INITIAL  3.  Pt will improve gait velocity to at least 2.0 ft/sec for improved gait efficiency and performance at SBA level  Baseline: 1.74 ft/sec CGA (5/24) Goal status: INITIAL   LONG TERM GOALS: Target date: 12/07/2022   Pt will be independent with  final HEP for improved strength, balance, transfers and gait. Baseline:  Goal status: INITIAL  2.  FGA assessment to be completed and LTG set; increased FGA score to >/= 15/30 to increase safety with gait. Baseline:  Goal status: INITIAL  3.  Pt will improve gait velocity to at least 2.25 ft/sec for improved gait efficiency and performance at SBA level  Baseline: 1.74 ft/sec CGA (5/24) Goal status: INITIAL  4.  Pt will improve normal TUG to less than or equal to 13 seconds for improved functional mobility and decreased fall risk. Baseline: 15.72 sec (5/24) Goal status: INITIAL  5.  mCTSIB to be assessed and LTG set;  maintain balance on condition 3 to >/= 20 secs to improve standing balance on compliant surfaces.  Baseline: pt unable to stand with EC on compliant or noncompliant surfaces Goal status: INITIAL   ASSESSMENT:  CLINICAL IMPRESSION: Emphasis of skilled PT session on trialing foot up brace to determine effectiveness for managing decreased R ankle DF during gait as well as continuing to work on SLS stability and dynamic balance over obstacles. Pt exhibits improved RLE clearance with use of foot-up brace this session, though he rarely wears tennis shoes. Educated pt on purpose of brace and that he would be at a decreased fall risk with this type of brace as he does not clear  his RLE due to decreased attention to this limb. Pt agreeable to purchase a foot up brace and to try wearing tennis shoes more often, will bring to next therapy session to have it set up in his shoes. Pt also continues to exhibit impaired balance and increased fall risk with difficulty performing tasks in SLS and difficulty clearing RLE over obstacles. Pt continues to benefit from skilled therapy services to work towards LTGs. Continue POC.    OBJECTIVE IMPAIRMENTS: Abnormal gait, decreased activity tolerance, decreased balance, decreased knowledge of condition, difficulty walking, decreased strength, impaired  perceived functional ability, and impaired sensation.   ACTIVITY LIMITATIONS: stairs and transfers  PARTICIPATION LIMITATIONS: driving and community activity  PERSONAL FACTORS: Age, Time since onset of injury/illness/exacerbation, and 3+ comorbidities:    hypertension, hyperlipidemia, diabetes mellitus, TIA, previous stroke (March 2020), h/o cervical fusion C5-7 (2012), h/o neck injury 2016, stage 3 CKD are also affecting patient's functional outcome.   REHAB POTENTIAL: Good  CLINICAL DECISION MAKING: Stable/uncomplicated  EVALUATION COMPLEXITY: Moderate  PLAN:  PT FREQUENCY: 2x/week  PT DURATION: 6 weeks  PLANNED INTERVENTIONS: Therapeutic exercises, Therapeutic activity, Neuromuscular re-education, Balance training, Gait training, Patient/Family education, Self Care, Joint mobilization, Stair training, Vestibular training, Canalith repositioning, Visual/preceptual remediation/compensation, Orthotic/Fit training, DME instructions, Aquatic Therapy, Electrical stimulation, Manual therapy, and Re-evaluation   PLAN FOR NEXT SESSION:   Pt to BRING sneakers to session and wear dress shoes, check HEP for any ?'s or problems;  cont balance and gait training, did pt purchase foot up brace? If so we can set up in his shoes, SLS stability, RLE strengthening    Peter Congo, PT, DPT, CSRS 11/08/2022, 12:29 PM

## 2022-11-08 NOTE — Procedures (Signed)
    History:  72 year old man with transient alteration of awareness  EEG classification: Awake and drowsy  Description of the recording: The background rhythms of this recording consists of a fairly well modulated medium amplitude alpha rhythm of 9 Hz that is reactive to eye opening and closure. Present in the anterior head region is a 15-20 Hz beta activity. Photic stimulation was performed, did not show any abnormalities. Hyperventilation was also performed, did not show any abnormalities. Drowsiness was manifested by background fragmentation. No abnormal epileptiform discharges seen during this recording. There was no focal slowing. There were no electrographic seizure identified.   Abnormality: None   Impression: This is a normal EEG recorded while drowsy and awake. No evidence of interictal epileptiform discharges. Normal EEGs, however, do not rule out epilepsy.    Windell Norfolk, MD [facility]

## 2022-11-13 ENCOUNTER — Ambulatory Visit: Payer: Medicare PPO | Admitting: Physical Therapy

## 2022-11-13 VITALS — BP 122/78 | HR 88

## 2022-11-13 DIAGNOSIS — R2689 Other abnormalities of gait and mobility: Secondary | ICD-10-CM | POA: Diagnosis not present

## 2022-11-13 DIAGNOSIS — R293 Abnormal posture: Secondary | ICD-10-CM | POA: Diagnosis not present

## 2022-11-13 DIAGNOSIS — M6281 Muscle weakness (generalized): Secondary | ICD-10-CM | POA: Diagnosis not present

## 2022-11-13 DIAGNOSIS — R2681 Unsteadiness on feet: Secondary | ICD-10-CM

## 2022-11-13 DIAGNOSIS — I69351 Hemiplegia and hemiparesis following cerebral infarction affecting right dominant side: Secondary | ICD-10-CM | POA: Diagnosis not present

## 2022-11-13 NOTE — Therapy (Unsigned)
OUTPATIENT PHYSICAL THERAPY NEURO TREATMENT NOTE   Patient Name: MATI STELLWAGEN MRN: [id number] DOB:12/19/1950, 72 y.o., male Today's Date: 11/14/2022   PCP: Anson Fret, MD REFERRING PROVIDER: Windell Norfolk, MD  END OF SESSION:  PT End of Session - 11/14/22 1507     Visit Number 6    Number of Visits 13   with eval   Date for PT Re-Evaluation 12/21/22    Authorization Type [employer]    Authorization Time Period 5-24 - 12-21-22    Authorization - Visit Number 6    Authorization - Number of Visits 13    Progress Note Due on Visit 10    PT Start Time 1535    PT Stop Time 1619    PT Time Calculation (min) 44 min    Equipment Utilized During Treatment Gait belt    Activity Tolerance Treatment limited secondary to medical complications (Comment);Other (comment)   lightheadedness reported after pt had another episode of seizure like activity -   Behavior During Therapy WFL for tasks assessed/performed                  Past Medical History:  Diagnosis Date   ADHD (attention deficit hyperactivity disorder)    Diabetes mellitus    Hyperlipidemia    Hypertension    Renal disorder    Seizures (HCC)    Stroke [facility])    Past Surgical History:  Procedure Laterality Date   IR ANGIO INTRA EXTRACRAN SEL COM CAROTID INNOMINATE BILAT MOD SED  01/03/2021   IR ANGIO VERTEBRAL SEL SUBCLAVIAN INNOMINATE UNI L MOD SED  01/03/2021   IR ANGIO VERTEBRAL SEL VERTEBRAL UNI R MOD SED  01/03/2021   IR RADIOLOGIST EVAL & MGMT  01/13/2021   IR US GUIDE VASC ACCESS RIGHT  01/03/2021   NECK SURGERY     SPINE SURGERY     Cervical spine x 2; Vear Clock; Critzer.   Patient Active Problem List   Diagnosis Date Noted   TIA (transient ischemic attack) 03/10/2020   CKD (chronic kidney disease) 08/28/2018   Retinopathy of both eyes 08/28/2018   Stroke due to embolism of left vertebral artery (HCC) 08/12/2018   Stroke (HCC) 08/08/2018   Diabetes mellitus (HCC) 07/25/2015   Neck injury  06/22/2015   Hearing loss in right ear 10/14/2011   Agent orange exposure 10/14/2011   ADD (attention deficit disorder) 10/14/2011   Hyperlipidemia 10/14/2011   BMI 28.0-28.9,adult 10/14/2011    ONSET DATE: 10/19/2022  REFERRING DIAG: R40.4 (ICD-10-CM) - Transient alteration of awareness  THERAPY DIAG:  Other abnormalities of gait and mobility  Unsteadiness on feet  Muscle weakness (generalized)  Rationale for Evaluation and Treatment: Rehabilitation  SUBJECTIVE:  SUBJECTIVE STATEMENT:  Pt wearing dress shoes to PT appt; did not bring his sneakers ([employer]); pt states he decided against ordering the foot up brace at this time because he doesn't think he will wear his tennis shoes enough to justify getting it - states he plans on wearing his dress shoes almost all the time. Wife states pt had EEG last Thursday - results did not show anything.   Pt accompanied by: self and significant other wife [name]  PERTINENT HISTORY: PMH: hypertension, hyperlipidemia, diabetes mellitus, TIA, previous stroke (March 2020), h/o cervical fusion C5-7 (2012), h/o neck injury 2016, stage 3 CKD  Per Dr. [name] note 10/18/22: HISTORY OF PRESENT ILLNESS:  This is a 72 year old gentleman past medical history of hypertension, hyperlipidemia, diabetes mellitus, TIA, previous stroke who is presenting after being admitted to the hospital for an episode of altered mental status/unresponsiveness.  Patient reports being at home, sitting on recliner and his glucose monitor warned him for low blood sugar.  Wife gave him two sips of soda and then he became unresponsive.  Wife reports eyes were open, tongue rolled back, initially unresponsive to touch and he started flailing.  EMS was called, per chart review when EMS arrived he was  still hypoglycemic and he received dextrose.  He was taken to the hospital.  His initial workup including head CT was negative.  Wife reports that he did have episodes similar but not this severe and those episodes were not related to low blood sugar.  He had an EEG a year ago which was negative for any epileptic epileptiform discharge but show mild diffuse slowing.  Since discharge from the hospital he is back to his normal self, no other complaints.   PAIN:  Are you having pain? No  PRECAUTIONS: Fall  WEIGHT BEARING RESTRICTIONS: No  FALLS: Has patient fallen in last 6 months? Yes. Number of falls several but less than one per month; no injuries with falls, able to get back up independently; unsure of cause "just lost my balance"  LIVING ENVIRONMENT: Lives with: lives with their family Lives in: House/apartment Stairs: Yes: External: 2 steps; none (uses his wife for support when navigating stairs) Has following equipment at home: Single point cane, Environmental consultant - 2 wheeled, Wheelchair (manual), and Ramped entry  PLOF: Independent with gait and Independent with transfers  PATIENT GOALS: "I'd like to have better balance, walk better"  OBJECTIVE: 11-13-22  Vitals:   11/13/22 1606  BP: 122/78  Pulse: 88     Gait Gait pattern: decreased hip/knee flexion- Right and decreased ankle dorsiflexion- Right Distance walked: 115 ft x 2 reps Assistive device utilized: None Level of assistance: CGA Comments:  On 1st rep - large green noodle was placed behind pt's back with arms in shoulder extension to facilitate upright posture; CGA to amb. 115';  2nd rep - 115' pt held medium sized ball (purple ball) overhead to facilitate upright posture with look up rather than down and to decrease trunk flexion  Neuro Re-ed: SLS activities - pt performed tap ups to 1st step (6" step) with minimal UE support 5 reps each foot; then tap ups to 2nd step 5 reps each foot with CGA to min assist for recovery of LOB -  pt needed intermittent cues to lift foot high off step to avoid catching foot with return back to mid stance on floor  Pt performed stepping exercise at bar at mirror - performed alternate stepping forward/back 5 reps each foot, out/in 5 reps each foot  and then back/forward 5 reps each foot with CGA and with UE support on bar as needed for assist with balance Pt stood on Airex - performed unilateral UE flexion 5 reps each arm; then bilateral UE flexion to approx. 100 degrees shoulder flexion 5 reps with min assist for balance - performed this activity for core stabilization  TherEx; Rt step ups onto 1st step (6") 10 reps for Rt hip flexor & quad strengthening  Sit to stand transfers from mat table 3 reps with feet on floor; 5 reps with feet on Airex with CGA to min assist for balance   PATIENT EDUCATION: Education details: continue HEP and walking program, handout for where to purchase foot up brace Person educated: Patient and Spouse Education method: Explanation, demonstration and handouts Education comprehension: verbalized understanding and needs further education  HOME EXERCISE PROGRAM: Access Code: [email or web address] URL: [email or web address] Date: 10/30/2022 Prepared by: Maebelle Munroe  Exercises - Standing Hip Flexion with Counter Support  - 1 x daily - 7 x weekly - 1 sets - 10 reps - Standing March with Counter Support  - 1 x daily - 7 x weekly - 3 sets - 10 reps - Standing Hip Extension with Counter Support  - 1 x daily - 7 x weekly - 3 sets - 10 reps - Standing Hip Abduction with Counter Support  - 1 x daily - 7 x weekly - 1 sets - 10 reps - Standing with Eyes Open  - 1 x daily - 7 x weekly - 1 sets - 5 reps - Sit to Stand with Counter Support  - 1 x daily - 7 x weekly - 1 sets - 10 reps - Supine Active Straight Leg Raise  - 1 x daily - 7 x weekly - 1 sets - 10 reps - Sidelying Hip Abduction  - 1 x daily - 7 x weekly - 1 sets - 10 reps - Clamshell with Resistance  - 1 x  daily - 7 x weekly - 1 sets - 10 reps - 3 sec hold - Supine Knee to Chest with Leg Straight  - 1 x daily - 7 x weekly - 1 sets - 10 reps  Walking Program: Walk with your family. Walk during cooler times of the day. Starting this week, walk 1 min per day for 7 days. Following week add 1 minutes to your total time. Week 1: 1 min Week 2: 2 min Week 3: 3 min Week 4: 4 min...Marland KitchenMarland KitchenUntil you can walk to 15 min per day    GOALS: Goals reviewed with patient? No  SHORT TERM GOALS: Target date: 11/16/2022  Pt will be independent with initial HEP for improved strength, balance, transfers and gait. Baseline: Goal status: Goal met 11-13-22  2.  FGA assessment to be completed and STG set;  Increase FGA score to >/=11/30  Baseline:  Goal status: INITIAL  3.  Pt will improve gait velocity to at least 2.0 ft/sec for improved gait efficiency and performance at SBA level  Baseline: 1.74 ft/sec CGA (5/24);  11.78 , 14.00 = 2.78 ft/sec (11.78 secs) with no device Goal status:  Goal met  11-13-22   LONG TERM GOALS: Target date: 12/07/2022   Pt will be independent with final HEP for improved strength, balance, transfers and gait. Baseline:  Goal status: INITIAL  2.  FGA assessment to be completed and LTG set; increased FGA score to >/= 15/30 to increase safety with gait. Baseline:  Goal status: INITIAL  3.  Pt will  improve gait velocity to at least 2.25 ft/sec for improved gait efficiency and performance at SBA level  Baseline: 1.74 ft/sec CGA (5/24) Goal status: INITIAL  4.  Pt will improve normal TUG to less than or equal to 13 seconds for improved functional mobility and decreased fall risk. Baseline: 15.72 sec (5/24) Goal status: INITIAL  5.  mCTSIB to be assessed and LTG set;  maintain balance on condition 3 to >/= 20 secs to improve standing balance on compliant surfaces.  Baseline: pt unable to stand with EC on compliant or noncompliant surfaces Goal status:  INITIAL   ASSESSMENT:  CLINICAL IMPRESSION: PT session focused on gait training with emphasis on postural retraining with use of noodle and ball held overhead to facilitate erect posture with less forward lean during gait.  Pt had another seizure like episode with lability occurring afterward, but not as intense or as long duration as the previous episode which occurred approx. 2 weeks ago in PT.  Pt able to continue therapy after approx. 3"  - pt was seated on mat when episode occurred.  Pt's blood sugar was 141 (his monitor) and BP was WNL's.  Pt's gait appears to be somewhat improved with slight increase in upright posture noted  - pt wore his dress shoes in today's session.  Continue POC.    OBJECTIVE IMPAIRMENTS: Abnormal gait, decreased activity tolerance, decreased balance, decreased knowledge of condition, difficulty walking, decreased strength, impaired perceived functional ability, and impaired sensation.   ACTIVITY LIMITATIONS: stairs and transfers  PARTICIPATION LIMITATIONS: driving and community activity  PERSONAL FACTORS: Age, Time since onset of injury/illness/exacerbation, and 3+ comorbidities:    hypertension, hyperlipidemia, diabetes mellitus, TIA, previous stroke (March 2020), h/o cervical fusion C5-7 (2012), h/o neck injury 2016, stage 3 CKD are also affecting patient's functional outcome.   REHAB POTENTIAL: Good  CLINICAL DECISION MAKING: Stable/uncomplicated  EVALUATION COMPLEXITY: Moderate  PLAN:  PT FREQUENCY: 2x/week  PT DURATION: 6 weeks  PLANNED INTERVENTIONS: Therapeutic exercises, Therapeutic activity, Neuromuscular re-education, Balance training, Gait training, Patient/Family education, Self Care, Joint mobilization, Stair training, Vestibular training, Canalith repositioning, Visual/preceptual remediation/compensation, Orthotic/Fit training, DME instructions, Aquatic Therapy, Electrical stimulation, Manual therapy, and Re-evaluation   PLAN FOR NEXT  SESSION:   Check STG #2:  check HEP for any ?'s or problems;  cont balance and gait training, SLS stability, RLE strengthening    Delonna Ney, Donavan Burnet, PT 11/14/2022, 3:11 PM

## 2022-11-14 ENCOUNTER — Encounter: Payer: Self-pay | Admitting: Physical Therapy

## 2022-11-15 ENCOUNTER — Ambulatory Visit: Payer: Medicare PPO | Admitting: Physical Therapy

## 2022-11-15 ENCOUNTER — Encounter: Payer: Self-pay | Admitting: Physical Therapy

## 2022-11-15 DIAGNOSIS — R2689 Other abnormalities of gait and mobility: Secondary | ICD-10-CM | POA: Diagnosis not present

## 2022-11-15 DIAGNOSIS — R293 Abnormal posture: Secondary | ICD-10-CM | POA: Diagnosis not present

## 2022-11-15 DIAGNOSIS — R2681 Unsteadiness on feet: Secondary | ICD-10-CM | POA: Diagnosis not present

## 2022-11-15 DIAGNOSIS — M6281 Muscle weakness (generalized): Secondary | ICD-10-CM | POA: Diagnosis not present

## 2022-11-15 DIAGNOSIS — I69351 Hemiplegia and hemiparesis following cerebral infarction affecting right dominant side: Secondary | ICD-10-CM | POA: Diagnosis not present

## 2022-11-15 NOTE — Therapy (Signed)
OUTPATIENT PHYSICAL THERAPY NEURO TREATMENT NOTE   Patient Name: Jeff [name] MRN: [id number] DOB:[birth year], 72 y.o., male Today's Date: 11/15/2022   PCP: Anson Fret, MD REFERRING PROVIDER: Windell Norfolk, MD  END OF SESSION:  PT End of Session - 11/15/22 1849     Visit Number 7    Number of Visits 13   with eval   Date for PT Re-Evaluation 12/21/22    Authorization Type [employer]    Authorization Time Period 5-24 - 12-21-22    Authorization - Number of Visits 13    Progress Note Due on Visit 10    PT Start Time 1536    PT Stop Time 1620    PT Time Calculation (min) 44 min    Equipment Utilized During Treatment Gait belt    Activity Tolerance Patient tolerated treatment well   lightheadedness reported after pt had another episode of seizure like activity -   Behavior During Therapy WFL for tasks assessed/performed                   Past Medical History:  Diagnosis Date   ADHD (attention deficit hyperactivity disorder)    Diabetes mellitus    Hyperlipidemia    Hypertension    Renal disorder    Seizures (HCC)    Stroke [facility])    Past Surgical History:  Procedure Laterality Date   IR ANGIO INTRA EXTRACRAN SEL COM CAROTID INNOMINATE BILAT MOD SED  01/03/2021   IR ANGIO VERTEBRAL SEL SUBCLAVIAN INNOMINATE UNI L MOD SED  01/03/2021   IR ANGIO VERTEBRAL SEL VERTEBRAL UNI R MOD SED  01/03/2021   IR RADIOLOGIST EVAL & MGMT  01/13/2021   IR US GUIDE VASC ACCESS RIGHT  01/03/2021   NECK SURGERY     SPINE SURGERY     Cervical spine x 2; Vear Clock; Critzer.   Patient Active Problem List   Diagnosis Date Noted   TIA (transient ischemic attack) 03/10/2020   CKD (chronic kidney disease) 08/28/2018   Retinopathy of both eyes 08/28/2018   Stroke due to embolism of left vertebral artery (HCC) 08/12/2018   Stroke (HCC) 08/08/2018   Diabetes mellitus (HCC) 07/25/2015   Neck injury 06/22/2015   Hearing loss in right ear 10/14/2011   Agent orange exposure 10/14/2011    ADD (attention deficit disorder) 10/14/2011   Hyperlipidemia 10/14/2011   BMI 28.0-28.9,adult 10/14/2011    ONSET DATE: 10/19/2022  REFERRING DIAG: R40.4 (ICD-10-CM) - Transient alteration of awareness  THERAPY DIAG:  Other abnormalities of gait and mobility  Unsteadiness on feet  Muscle weakness (generalized)  Rationale for Evaluation and Treatment: Rehabilitation  SUBJECTIVE:  SUBJECTIVE STATEMENT:  Pt walking much better today - states he had an appt with his PCP today and he (PCP) told him he was pleased with how he is doing; pt states his vitals, including his BP, oxygen and pulse were all good.  When asked how did he make so much improvement - he says "I've been working on it"; wife states pt has been doing his exercises at home  Pt accompanied by: self and significant other wife [name]  PERTINENT HISTORY: PMH: hypertension, hyperlipidemia, diabetes mellitus, TIA, previous stroke (March 2020), h/o cervical fusion C5-7 (2012), h/o neck injury 2016, stage 3 CKD  Per Dr. [name] note 10/18/22: HISTORY OF PRESENT ILLNESS:  This is a 72 year old gentleman past medical history of hypertension, hyperlipidemia, diabetes mellitus, TIA, previous stroke who is presenting after being admitted to the hospital for an episode of altered mental status/unresponsiveness.  Patient reports being at home, sitting on recliner and his glucose monitor warned him for low blood sugar.  Wife gave him two sips of soda and then he became unresponsive.  Wife reports eyes were open, tongue rolled back, initially unresponsive to touch and he started flailing.  EMS was called, per chart review when EMS arrived he was still hypoglycemic and he received dextrose.  He was taken to the hospital.  His initial workup including head  CT was negative.  Wife reports that he did have episodes similar but not this severe and those episodes were not related to low blood sugar.  He had an EEG a year ago which was negative for any epileptic epileptiform discharge but show mild diffuse slowing.  Since discharge from the hospital he is back to his normal self, no other complaints.   PAIN:  Are you having pain? No  PRECAUTIONS: Fall  WEIGHT BEARING RESTRICTIONS: No  FALLS: Has patient fallen in last 6 months? Yes. Number of falls several but less than one per month; no injuries with falls, able to get back up independently; unsure of cause "just lost my balance"  LIVING ENVIRONMENT: Lives with: lives with their family Lives in: House/apartment Stairs: Yes: External: 2 steps; none (uses his wife for support when navigating stairs) Has following equipment at home: Single point cane, Walker - 2 wheeled, Wheelchair (manual), and Ramped entry  PLOF: Independent with gait and Independent with transfers  PATIENT GOALS: "I'd like to have better balance, walk better"  OBJECTIVE: 11-15-22  There were no vitals filed for this visit.    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    Change in Gait Speed Able  to change speed, demonstrates mild gait deviations, deviates 6-10 in outside of the 12 in walkway  width, or no gait deviations, unable to achieve a major change in velocity, or uses a change in velocity, or uses an assistive device.    Gait with Horizontal Head Turns Performs head turns with moderate changes in gait velocity, slows down, deviates 10-15 in outside 12 in walkway width but recovers, can continue to walk.    Gait with Vertical Head Turns Performs task with moderate change in gait velocity, slows down, deviates 10-15 in outside 12 in walkway width but recovers, can continue to walk.    Gait and Pivot Turn Pivot turns safely in greater than 3 sec and stops with no loss of balance, or pivot turns safely within 3 sec and stops with mild imbalance, requires small steps to catch balance.    Step Over Obstacle Is able to step over one shoe box (4.5 in total height) but must slow down and adjust steps to clear box safely. May require verbal cueing.    Gait with Eyes Closed Cannot walk 20 ft without assistance, severe gait deviations or imbalance, deviates greater than 15 in outside 12 in walkway width or will not attempt task.    Ambulating Backwards Walks 20 ft, uses assistive device, slower speed, mild gait deviations, deviates 6-10 in outside 12 in walkway width.             Tandem walking = 0 (unable) Steps =  2 - pt required use of hand rail   Scores = 2,2, 1,1,2, 1, 0, 2, 0 , 2 = 13/30    Total score = 13/30  (unable to get entire FGA to pull into note - last 2 items scored separately from template)      Neuro Re-ed:  Standing Balance: Surface: Pillow (1) Position: Feet Hip Width Apart Completed with: Eyes Open and Eyes Closed; Head Turns x 5 Reps and Head Nods x 5 Reps  Marching on pillow - 10 reps each leg with EO with SBA:  then performed stepping down to floor and back up onto pillow 5 reps each foot with CGA   TherEx; Rt step ups onto 1st step (6") 10 reps for Rt hip  flexor & quad strengthening  Sit to stand transfers from mat table 5 reps without UE support Pt performed Rt hip flexor strengthening exercise with 3# weight 10 reps - 5 reps tapping 1st step:  10 reps lifting to tap 2nd step - pt used handrails for assist with balance Step ups RLE to 1st step 10 reps with 3# weight  Lateral step ups to 6" step - RLE with 3# weight 10 reps with UE support on handrail with min assist as pt did not fully place Rt foot on step consistently   PATIENT EDUCATION: Education details: continue HEP and walking program; use 2# weight on RLE for strengthening when walking in home but remove when leg feels fatigued or if he feels he is starting to drag RLE Person educated: Patient and Spouse Education method: Explanation, demonstration and handouts Education comprehension: verbalized understanding and needs further education  HOME EXERCISE PROGRAM: Access Code: [email or web address] URL: [email or web address] Date: 10/30/2022 Prepared by: Maebelle Munroe  Exercises - Standing Hip Flexion with Counter Support  - 1 x daily - 7 x weekly - 1 sets - 10 reps - Standing March with Counter Support  - 1 x daily - 7 x weekly - 3 sets - 10 reps - Standing Hip Extension with Counter Support  - 1 x daily - 7 x weekly - 3 sets -  10 reps - Standing Hip Abduction with Counter Support  - 1 x daily - 7 x weekly - 1 sets - 10 reps - Standing with Eyes Open  - 1 x daily - 7 x weekly - 1 sets - 5 reps - Sit to Stand with Counter Support  - 1 x daily - 7 x weekly - 1 sets - 10 reps - Supine Active Straight Leg Raise  - 1 x daily - 7 x weekly - 1 sets - 10 reps - Sidelying Hip Abduction  - 1 x daily - 7 x weekly - 1 sets - 10 reps - Clamshell with Resistance  - 1 x daily - 7 x weekly - 1 sets - 10 reps - 3 sec hold - Supine Knee to Chest with Leg Straight  - 1 x daily - 7 x weekly - 1 sets - 10 reps  Walking Program: Walk with your family. Walk during cooler times of the day. Starting  this week, walk 1 min per day for 7 days. Following week add 1 minutes to your total time. Week 1: 1 min Week 2: 2 min Week 3: 3 min Week 4: 4 min...Marland KitchenMarland KitchenUntil you can walk to 15 min per day    GOALS: Goals reviewed with patient? No  SHORT TERM GOALS: Target date: 11/16/2022  Pt will be independent with initial HEP for improved strength, balance, transfers and gait. Baseline: Goal status: Goal met 11-13-22  2.  FGA assessment to be completed and STG set;  Increase FGA score to >/=11/30  Baseline: Initial score 7/30:     score 13/30 on 11-15-22 Goal status: Goal met   3.  Pt will improve gait velocity to at least 2.0 ft/sec for improved gait efficiency and performance at SBA level  Baseline: 1.74 ft/sec CGA (5/24);  11.78 , 14.00 = 2.78 ft/sec (11.78 secs) with no device Goal status:  Goal met  11-13-22   LONG TERM GOALS: Target date: 12/07/2022   Pt will be independent with final HEP for improved strength, balance, transfers and gait. Baseline:  Goal status: INITIAL  2.  FGA assessment to be completed and LTG set; increased FGA score to >/= 15/30 to increase safety with gait. Baseline:  Goal status: INITIAL  3.  Pt will improve gait velocity to at least 2.25 ft/sec for improved gait efficiency and performance at SBA level  Baseline: 1.74 ft/sec CGA (5/24) Goal status: INITIAL  4.  Pt will improve normal TUG to less than or equal to 13 seconds for improved functional mobility and decreased fall risk. Baseline: 15.72 sec (5/24) Goal status: INITIAL  5.  mCTSIB to be assessed and LTG set;  maintain balance on condition 3 to >/= 20 secs to improve standing balance on compliant surfaces.  Baseline: pt unable to stand with EC on compliant or noncompliant surfaces Goal status: INITIAL   ASSESSMENT:  CLINICAL IMPRESSION: Pt has met STG #2 as FGA score has increased from 7/30 on 10-30-22 to 13/30 in today's session.  Goal exceeded as set for score >/= 11/30.  Pt's gait pattern  significantly improved with pt demonstrating increased stability and improved clearance RLE in swing through with no dragging of Rt leg occurring in today's session.  Pt also demonstrating increased arm swing and upright posture.  Pt does continue to have much difficulty maintaining balance with EC on compliant surface and with head turns, indicative of vestibular hypofunction.  Continue POC.    OBJECTIVE IMPAIRMENTS: Abnormal gait, decreased activity tolerance,  decreased balance, decreased knowledge of condition, difficulty walking, decreased strength, impaired perceived functional ability, and impaired sensation.   ACTIVITY LIMITATIONS: stairs and transfers  PARTICIPATION LIMITATIONS: driving and community activity  PERSONAL FACTORS: Age, Time since onset of injury/illness/exacerbation, and 3+ comorbidities:    hypertension, hyperlipidemia, diabetes mellitus, TIA, previous stroke (March 2020), h/o cervical fusion C5-7 (2012), h/o neck injury 2016, stage 3 CKD are also affecting patient's functional outcome.   REHAB POTENTIAL: Good  CLINICAL DECISION MAKING: Stable/uncomplicated  EVALUATION COMPLEXITY: Moderate  PLAN:  PT FREQUENCY: 2x/week  PT DURATION: 6 weeks  PLANNED INTERVENTIONS: Therapeutic exercises, Therapeutic activity, Neuromuscular re-education, Balance training, Gait training, Patient/Family education, Self Care, Joint mobilization, Stair training, Vestibular training, Canalith repositioning, Visual/preceptual remediation/compensation, Orthotic/Fit training, DME instructions, Aquatic Therapy, Electrical stimulation, Manual therapy, and Re-evaluation   PLAN FOR NEXT SESSION:   Continue balance &  and gait training, SLS stability, RLE strengthening    Kary Kos, PT 11/15/2022, 6:56 PM

## 2022-11-20 ENCOUNTER — Encounter: Payer: Self-pay | Admitting: Neurology

## 2022-11-20 ENCOUNTER — Encounter (HOSPITAL_COMMUNITY): Payer: Self-pay

## 2022-11-20 ENCOUNTER — Inpatient Hospital Stay (HOSPITAL_COMMUNITY)
Admission: EM | Admit: 2022-11-20 | Discharge: 2022-11-22 | DRG: 637 | Disposition: A | Discharge status: Home / Self Care | Payer: No Typology Code available for payment source | Attending: Family Medicine | Admitting: Family Medicine

## 2022-11-20 ENCOUNTER — Ambulatory Visit: Payer: Medicare PPO | Admitting: Physical Therapy

## 2022-11-20 ENCOUNTER — Other Ambulatory Visit: Payer: Self-pay

## 2022-11-20 VITALS — BP 114/72 | HR 76

## 2022-11-20 DIAGNOSIS — Z7984 Long term (current) use of oral hypoglycemic drugs: Secondary | ICD-10-CM

## 2022-11-20 DIAGNOSIS — N179 Acute kidney failure, unspecified: Secondary | ICD-10-CM | POA: Diagnosis not present

## 2022-11-20 DIAGNOSIS — Z6829 Body mass index (BMI) 29.0-29.9, adult: Secondary | ICD-10-CM

## 2022-11-20 DIAGNOSIS — Z7982 Long term (current) use of aspirin: Secondary | ICD-10-CM

## 2022-11-20 DIAGNOSIS — E162 Hypoglycemia, unspecified: Secondary | ICD-10-CM | POA: Diagnosis present

## 2022-11-20 DIAGNOSIS — W19XXXA Unspecified fall, initial encounter: Secondary | ICD-10-CM | POA: Insufficient documentation

## 2022-11-20 DIAGNOSIS — E11649 Type 2 diabetes mellitus with hypoglycemia without coma: Principal | ICD-10-CM

## 2022-11-20 DIAGNOSIS — E161 Other hypoglycemia: Secondary | ICD-10-CM | POA: Diagnosis not present

## 2022-11-20 DIAGNOSIS — E039 Hypothyroidism, unspecified: Secondary | ICD-10-CM | POA: Diagnosis present

## 2022-11-20 DIAGNOSIS — E669 Obesity, unspecified: Secondary | ICD-10-CM | POA: Diagnosis present

## 2022-11-20 DIAGNOSIS — Z8249 Family history of ischemic heart disease and other diseases of the circulatory system: Secondary | ICD-10-CM

## 2022-11-20 DIAGNOSIS — Z7985 Long-term (current) use of injectable non-insulin antidiabetic drugs: Secondary | ICD-10-CM

## 2022-11-20 DIAGNOSIS — Z91199 Patient's noncompliance with other medical treatment and regimen due to unspecified reason: Secondary | ICD-10-CM

## 2022-11-20 DIAGNOSIS — G40909 Epilepsy, unspecified, not intractable, without status epilepticus: Secondary | ICD-10-CM | POA: Diagnosis present

## 2022-11-20 DIAGNOSIS — R231 Pallor: Secondary | ICD-10-CM | POA: Diagnosis not present

## 2022-11-20 DIAGNOSIS — M6281 Muscle weakness (generalized): Secondary | ICD-10-CM

## 2022-11-20 DIAGNOSIS — R293 Abnormal posture: Secondary | ICD-10-CM

## 2022-11-20 DIAGNOSIS — I129 Hypertensive chronic kidney disease with stage 1 through stage 4 chronic kidney disease, or unspecified chronic kidney disease: Secondary | ICD-10-CM | POA: Diagnosis present

## 2022-11-20 DIAGNOSIS — R296 Repeated falls: Secondary | ICD-10-CM | POA: Insufficient documentation

## 2022-11-20 DIAGNOSIS — R2681 Unsteadiness on feet: Secondary | ICD-10-CM

## 2022-11-20 DIAGNOSIS — Z794 Long term (current) use of insulin: Secondary | ICD-10-CM

## 2022-11-20 DIAGNOSIS — E872 Acidosis, unspecified: Secondary | ICD-10-CM | POA: Diagnosis present

## 2022-11-20 DIAGNOSIS — G9341 Metabolic encephalopathy: Secondary | ICD-10-CM | POA: Diagnosis present

## 2022-11-20 DIAGNOSIS — I69351 Hemiplegia and hemiparesis following cerebral infarction affecting right dominant side: Secondary | ICD-10-CM

## 2022-11-20 DIAGNOSIS — R55 Syncope and collapse: Secondary | ICD-10-CM | POA: Insufficient documentation

## 2022-11-20 DIAGNOSIS — Z79890 Hormone replacement therapy (postmenopausal): Secondary | ICD-10-CM

## 2022-11-20 DIAGNOSIS — Z79899 Other long term (current) drug therapy: Secondary | ICD-10-CM

## 2022-11-20 DIAGNOSIS — G9340 Encephalopathy, unspecified: Secondary | ICD-10-CM | POA: Insufficient documentation

## 2022-11-20 DIAGNOSIS — F909 Attention-deficit hyperactivity disorder, unspecified type: Secondary | ICD-10-CM | POA: Diagnosis present

## 2022-11-20 DIAGNOSIS — E785 Hyperlipidemia, unspecified: Secondary | ICD-10-CM | POA: Diagnosis present

## 2022-11-20 DIAGNOSIS — G4733 Obstructive sleep apnea (adult) (pediatric): Secondary | ICD-10-CM | POA: Diagnosis present

## 2022-11-20 DIAGNOSIS — R410 Disorientation, unspecified: Secondary | ICD-10-CM | POA: Diagnosis not present

## 2022-11-20 DIAGNOSIS — R339 Retention of urine, unspecified: Secondary | ICD-10-CM | POA: Diagnosis present

## 2022-11-20 DIAGNOSIS — R2689 Other abnormalities of gait and mobility: Secondary | ICD-10-CM

## 2022-11-20 DIAGNOSIS — E1122 Type 2 diabetes mellitus with diabetic chronic kidney disease: Secondary | ICD-10-CM | POA: Diagnosis present

## 2022-11-20 DIAGNOSIS — Z833 Family history of diabetes mellitus: Secondary | ICD-10-CM

## 2022-11-20 DIAGNOSIS — I10 Essential (primary) hypertension: Secondary | ICD-10-CM | POA: Insufficient documentation

## 2022-11-20 DIAGNOSIS — N189 Chronic kidney disease, unspecified: Secondary | ICD-10-CM | POA: Diagnosis present

## 2022-11-20 DIAGNOSIS — R569 Unspecified convulsions: Principal | ICD-10-CM

## 2022-11-20 DIAGNOSIS — Z8673 Personal history of transient ischemic attack (TIA), and cerebral infarction without residual deficits: Secondary | ICD-10-CM

## 2022-11-20 DIAGNOSIS — N182 Chronic kidney disease, stage 2 (mild): Secondary | ICD-10-CM | POA: Diagnosis present

## 2022-11-20 LAB — LACTIC ACID, PLASMA: Lactic Acid, Venous: 1.6 mmol/L (ref 0.5–1.9)

## 2022-11-20 LAB — COMPREHENSIVE METABOLIC PANEL
ALT: 24 U/L (ref 0–44)
AST: 21 U/L (ref 15–41)
Albumin: 3.8 g/dL (ref 3.5–5.0)
Alkaline Phosphatase: 51 U/L (ref 38–126)
Anion gap: 15 (ref 5–15)
BUN: 25 mg/dL — ABNORMAL HIGH (ref 8–23)
CO2: 17 mmol/L — ABNORMAL LOW (ref 22–32)
Calcium: 9.1 mg/dL (ref 8.9–10.3)
Chloride: 104 mmol/L (ref 98–111)
Creatinine, Ser: 1.6 mg/dL — ABNORMAL HIGH (ref 0.61–1.24)
GFR, Estimated: 45 mL/min — ABNORMAL LOW (ref >60)
Glucose, Bld: 110 mg/dL — ABNORMAL HIGH (ref 70–99)
Potassium: 3.5 mmol/L (ref 3.5–5.1)
Sodium: 136 mmol/L (ref 135–145)
Total Bilirubin: 0.5 mg/dL (ref 0.3–1.2)
Total Protein: 6.5 g/dL (ref 6.5–8.1)

## 2022-11-20 LAB — CBC WITH DIFFERENTIAL/PLATELET
Abs Immature Granulocytes: 0.02 10*3/uL (ref 0.00–0.07)
Basophils Absolute: 0.1 10*3/uL (ref 0.0–0.1)
Basophils Relative: 1 %
Eosinophils Absolute: 0.2 10*3/uL (ref 0.0–0.5)
Eosinophils Relative: 3 %
HCT: 44.7 % (ref 39.0–52.0)
Hemoglobin: 15.1 g/dL (ref 13.0–17.0)
Immature Granulocytes: 0 %
Lymphocytes Relative: 17 %
Lymphs Abs: 1.2 10*3/uL (ref 0.7–4.0)
MCH: 28.9 pg (ref 26.0–34.0)
MCHC: 33.8 g/dL (ref 30.0–36.0)
MCV: 85.5 fL (ref 80.0–100.0)
Monocytes Absolute: 0.7 10*3/uL (ref 0.1–1.0)
Monocytes Relative: 10 %
Neutro Abs: 4.7 10*3/uL (ref 1.7–7.7)
Neutrophils Relative %: 69 %
Platelets: 199 10*3/uL (ref 150–400)
RBC: 5.23 MIL/uL (ref 4.22–5.81)
RDW: 13.3 % (ref 11.5–15.5)
WBC: 6.8 10*3/uL (ref 4.0–10.5)
nRBC: 0 % (ref 0.0–0.2)

## 2022-11-20 LAB — I-STAT VENOUS BLOOD GAS, ED
Acid-base deficit: 5 mmol/L — ABNORMAL HIGH (ref 0.0–2.0)
Bicarbonate: 19.4 mmol/L — ABNORMAL LOW (ref 20.0–28.0)
Calcium, Ion: 1.19 mmol/L (ref 1.15–1.40)
HCT: 42 % (ref 39.0–52.0)
Hemoglobin: 14.3 g/dL (ref 13.0–17.0)
O2 Saturation: 93 %
Potassium: 3.5 mmol/L (ref 3.5–5.1)
Sodium: 141 mmol/L (ref 135–145)
TCO2: 20 mmol/L — ABNORMAL LOW (ref 22–32)
pCO2, Ven: 33.6 mmHg — ABNORMAL LOW (ref 44–60)
pH, Ven: 7.369 (ref 7.25–7.43)
pO2, Ven: 69 mmHg — ABNORMAL HIGH (ref 32–45)

## 2022-11-20 LAB — RAPID URINE DRUG SCREEN, HOSP PERFORMED
Amphetamines: NOT DETECTED
Barbiturates: NOT DETECTED
Benzodiazepines: NOT DETECTED
Cocaine: NOT DETECTED
Opiates: NOT DETECTED
Tetrahydrocannabinol: NOT DETECTED

## 2022-11-20 LAB — BLOOD GAS, VENOUS
Acid-Base Excess: 0.6 mmol/L (ref 0.0–2.0)
Bicarbonate: 26.6 mmol/L (ref 20.0–28.0)
Drawn by: 7872
O2 Saturation: 42.3 %
Patient temperature: 36.4
pCO2, Ven: 46 mmHg (ref 44–60)
pH, Ven: 7.37 (ref 7.25–7.43)
pO2, Ven: <31 mmHg — CL (ref 32–45)

## 2022-11-20 LAB — GLUCOSE, CAPILLARY
Glucose-Capillary: 123 mg/dL — ABNORMAL HIGH (ref 70–99)
Glucose-Capillary: 146 mg/dL — ABNORMAL HIGH (ref 70–99)

## 2022-11-20 LAB — MAGNESIUM: Magnesium: 2 mg/dL (ref 1.7–2.4)

## 2022-11-20 LAB — CBG MONITORING, ED
Glucose-Capillary: 184 mg/dL — ABNORMAL HIGH (ref 70–99)
Glucose-Capillary: 51 mg/dL — ABNORMAL LOW (ref 70–99)
Glucose-Capillary: 124 mg/dL — ABNORMAL HIGH (ref 70–99)

## 2022-11-20 LAB — HEMOGLOBIN A1C
Hgb A1c MFr Bld: 6.7 % — ABNORMAL HIGH (ref 4.8–5.6)
Mean Plasma Glucose: 145.59 mg/dL

## 2022-11-20 MED ORDER — DEXTROSE 10 % IV SOLN: INTRAVENOUS | Status: DC

## 2022-11-20 MED ORDER — INSULIN ASPART 100 UNIT/ML IJ SOLN
0.0000 [IU] | Freq: Three times a day (TID) | INTRAMUSCULAR | Status: DC
  Administered 2022-11-20: 1 [IU] via SUBCUTANEOUS
  Administered 2022-11-21: 2 [IU] via SUBCUTANEOUS
  Administered 2022-11-21: 1 [IU] via SUBCUTANEOUS
  Administered 2022-11-21 – 2022-11-22 (×2): 2 [IU] via SUBCUTANEOUS
  Administered 2022-11-22: 3 [IU] via SUBCUTANEOUS

## 2022-11-20 MED ORDER — ROSUVASTATIN CALCIUM 20 MG PO TABS
40.0000 mg | ORAL_TABLET | Freq: Every day | ORAL | Status: DC
  Administered 2022-11-21 – 2022-11-22 (×2): 40 mg via ORAL
  Filled 2022-11-20 (×2): qty 2

## 2022-11-20 MED ORDER — LEVOTHYROXINE SODIUM 50 MCG PO TABS
50.0000 ug | ORAL_TABLET | Freq: Every day | ORAL | Status: DC
  Administered 2022-11-21 – 2022-11-22 (×2): 50 ug via ORAL
  Filled 2022-11-20 (×2): qty 1

## 2022-11-20 MED ORDER — LISINOPRIL 2.5 MG PO TABS
2.5000 mg | ORAL_TABLET | Freq: Every day | ORAL | Status: DC
  Administered 2022-11-21 – 2022-11-22 (×2): 2.5 mg via ORAL
  Filled 2022-11-20 (×2): qty 1

## 2022-11-20 MED ORDER — HEPARIN SODIUM (PORCINE) 5000 UNIT/ML IJ SOLN
5000.0000 [IU] | Freq: Two times a day (BID) | INTRAMUSCULAR | Status: DC
  Administered 2022-11-20 – 2022-11-22 (×4): 5000 [IU] via SUBCUTANEOUS
  Filled 2022-11-20 (×4): qty 1

## 2022-11-20 MED ORDER — ASPIRIN 81 MG PO TBEC
81.0000 mg | DELAYED_RELEASE_TABLET | Freq: Every day | ORAL | Status: DC
  Administered 2022-11-21 – 2022-11-22 (×2): 81 mg via ORAL
  Filled 2022-11-20 (×2): qty 1

## 2022-11-20 MED ORDER — ACETAMINOPHEN 500 MG PO TABS: 500.0000 mg | ORAL_TABLET | Freq: Four times a day (QID) | ORAL | Status: DC | PRN

## 2022-11-20 MED ORDER — PANTOPRAZOLE SODIUM 40 MG PO TBEC
40.0000 mg | DELAYED_RELEASE_TABLET | Freq: Every day | ORAL | Status: DC
  Administered 2022-11-21 – 2022-11-22 (×2): 40 mg via ORAL
  Filled 2022-11-20 (×2): qty 1

## 2022-11-20 MED ORDER — LORAZEPAM 2 MG/ML IJ SOLN
INTRAMUSCULAR | Status: AC
  Filled 2022-11-20: qty 1

## 2022-11-20 MED ORDER — DEXTROSE 50 % IV SOLN
1.0000 | Freq: Once | INTRAVENOUS | Status: AC
  Administered 2022-11-20: 50 mL via INTRAVENOUS
  Filled 2022-11-20: qty 50

## 2022-11-20 MED ORDER — TOPIRAMATE 25 MG PO TABS
50.0000 mg | ORAL_TABLET | Freq: Two times a day (BID) | ORAL | Status: DC
  Administered 2022-11-20 – 2022-11-22 (×4): 50 mg via ORAL
  Filled 2022-11-20 (×4): qty 2

## 2022-11-20 NOTE — ED Notes (Signed)
Pt having seizure that lasted 2 minutes. Pt had left sided gaze. PA notified. 2mg  of verbal ativan given.

## 2022-11-20 NOTE — ED Triage Notes (Signed)
Pt came by EMS . Pt was from outpatient neuro rehab for past stroke and had 2 seizures back to back that lasted 1 minute. EMS gave D10, bs was 69. [id number]

## 2022-11-20 NOTE — ED Provider Notes (Signed)
[facility] AT [facility] Provider Note   CSN: [id number] Arrival date & time: 11/20/22  1136     History  Chief Complaint  Patient presents with   Seizures    Jeff [name] is a 72 y.o. male with h/o CVA, HTN, CKD, HLD, DM, who presents with seizures.   Patient was from outpatient neuro rehab for past stroke and had 2 episodes of seizure-like activity back to back that lasted 1 minute each, terminated on their own. Described as episodes of staring straight ahead, decreased responsiveness. A/w some loss of bladder, no tongue biting. EMS gave D10 as BG was 69 mg/dL. Took normal insulin dose this morning, ate breakfast like normal. Woke up in ambulance bay and felt confused about where he was but otherwise felt okay. Patient doesn't have memory of event.   Patient has had seizure-like activity over the last months. Last one was two weeks ago. Was also seen in ED from 10/05/22 for seizure-like activity vs syncope.   Patient's wife states that they were at physical therapy and Dr. Teresa Coombs his neurologist was right next door. Dr. Teresa Coombs came over and witnessed the episode, told wife he wanted patient admitted for prolonged EEG. Per chart review of neuro note from 11/08/22, pt had transient alteration of awareness w/ EEG that was non-epileptiform in nature, but does not rule out epilepsy.      Seizures      Home Medications Prior to Admission medications   Medication Sig Start Date End Date Taking? Authorizing Provider  acetaminophen (TYLENOL) 500 MG tablet Take 500-1,000 mg by mouth every 6 (six) hours as needed for headache.   Yes [provider]  aspirin EC 81 MG EC tablet Take 1 tablet (81 mg total) by mouth daily. Swallow whole. 01/04/21  Yes Joseph Art, DO  Cholecalciferol (VITAMIN D) 50 MCG (2000 UT) tablet Take 2,000 Units by mouth daily.   Yes [provider]  empagliflozin (JARDIANCE) 25 MG TABS tablet Take 12.5 mg by mouth daily.  02/09/21  Yes [provider]  Glucagon 1 MG/0.2ML SOAJ Inject 1 mg into the skin as needed (low blood sugar).   Yes [provider]  insulin aspart (NOVOLOG FLEXPEN) 100 UNIT/ML FlexPen Inject 0-20 Units into the skin See admin instructions. Sliding scale per endocrinologist. (3 times with each meal) Less than 100 = 0  100 -150 = 10 units  150-200 = 15 units   200-250 = 20 units   Yes [provider]  insulin glargine (LANTUS) 100 UNIT/ML injection Inject 0.45-0.55 mLs (45-55 Units total) into the skin 2 (two) times daily. Inject 45 units in the morning and 55 units at bedtime Patient taking differently: Inject 40 Units into the skin 2 (two) times daily. 01/03/21  Yes Joseph Art, DO  levothyroxine (SYNTHROID) 50 MCG tablet Take 50 mcg by mouth daily before breakfast.   Yes [provider]  lisinopril (ZESTRIL) 2.5 MG tablet Take 1 tablet (2.5 mg total) by mouth daily. 01/09/21  Yes Joseph Art, DO  Magnesium 250 MG TABS Take 250 mg by mouth 2 (two) times daily.   Yes [provider]  Multiple Vitamins-Minerals (ICAPS AREDS 2 PO) Take 1 capsule by mouth in the morning and at bedtime.   Yes [provider]  OZEMPIC, 1 MG/DOSE, 4 MG/3ML SOPN Inject 3 mg into the skin once a week. Thursdays 11/28/21  Yes [provider]  pantoprazole (PROTONIX) 40 MG tablet Take  1 tablet (40 mg total) by mouth daily. 01/03/21  Yes Vann, Jessica U, DO  rosuvastatin (CRESTOR) 40 MG tablet TAKE 1 TABLET BY MOUTH DAILY Patient taking differently: Take 40 mg by mouth every evening. 08/28/18  Yes Lezlie Lye, Meda Coffee, MD  topiramate (TOPAMAX) 50 MG tablet Take 1 tablet (50 mg total) by mouth 2 (two) times daily. 07/03/22  Yes McCue, Shanda Bumps, NP  glucose blood (ONETOUCH VERIO) test strip 1 each by Other route 4 (four) times daily -  before meals and at bedtime. dexcom 6 01/03/21   Joseph Art, DO  Insulin Syringe-Needle U-100 (B-D INS SYR ULTRAFINE 1CC/31G) 31G  X 5/16" 1 ML MISC Used to inject insulin twice daily. 08/16/17   Romero Belling, MD      Allergies    Flexeril Fernande Boyden hcl], Lidocaine, and Novocain [procaine hcl]    Review of Systems   Review of Systems  Neurological:  Positive for seizures.   A 10 point review of systems was performed and is negative unless otherwise reported in HPI.  Physical Exam Updated Vital Signs BP 117/71   Pulse 79   Temp 98.9 F (37.2 C) (Oral)   Resp 19   Ht 5\' 8"  (1.727 m)   Wt 87.1 kg   SpO2 92%   BMI 29.19 kg/m  Physical Exam General: Normal appearing male, lying in bed.  HEENT: PERRLA, EOMI, no nystagmus, Sclera anicteric, MMM, trachea midline. NCAT. Tongue protrudes midline.  Cardiology: RRR, no murmurs/rubs/gallops. BL radial and DP pulses equal bilaterally.  Resp: Normal respiratory rate and effort. CTAB, no wheezes, rhonchi, crackles.  Abd: Soft, non-tender, non-distended. No rebound tenderness or guarding.  GU: Deferred. MSK: No peripheral edema or signs of trauma. Extremities without deformity or TTP. No cyanosis or clubbing. Skin: warm, dry.  Neuro: A&Ox4, CNs II-XII grossly intact. MAEs. Sensation grossly intact. Normal speech. Psych: Normal mood and affect.   ED Results / Procedures / Treatments   Labs (all labs ordered are listed, but only abnormal results are displayed) Labs Reviewed  COMPREHENSIVE METABOLIC PANEL - Abnormal; Notable for the following components:      Result Value   CO2 17 (*)    Glucose, Bld 110 (*)    BUN 25 (*)    Creatinine, Ser 1.60 (*)    GFR, Estimated 45 (*)    All other components within normal limits  HEMOGLOBIN A1C - Abnormal; Notable for the following components:   Hgb A1c MFr Bld 6.7 (*)    All other components within normal limits  CBG MONITORING, ED - Abnormal; Notable for the following components:   Glucose-Capillary 184 (*)    All other components within normal limits  CBG MONITORING, ED - Abnormal; Notable for the following  components:   Glucose-Capillary 51 (*)    All other components within normal limits  CBG MONITORING, ED - Abnormal; Notable for the following components:   Glucose-Capillary 124 (*)    All other components within normal limits  I-STAT VENOUS BLOOD GAS, ED - Abnormal; Notable for the following components:   pCO2, Ven 33.6 (*)    pO2, Ven 69 (*)    Bicarbonate 19.4 (*)    TCO2 20 (*)    Acid-base deficit 5.0 (*)    All other components within normal limits  CBC WITH DIFFERENTIAL/PLATELET  LACTIC ACID, PLASMA  MAGNESIUM  RAPID URINE DRUG SCREEN, HOSP PERFORMED  BLOOD GAS, VENOUS  CBG MONITORING, ED  CBG MONITORING, ED    EKG None  Radiology No results found.  Procedures .Critical Care  Performed by: Loetta Rough, MD Authorized by: Loetta Rough, MD   Critical care provider statement:    Critical care time (minutes):  30   Critical care was necessary to treat or prevent imminent or life-threatening deterioration of the following conditions:  Endocrine crisis   Critical care was time spent personally by me on the following activities:  Development of treatment plan with patient or surrogate, discussions with consultants, evaluation of patient's response to treatment, examination of patient, ordering and review of laboratory studies, ordering and review of radiographic studies, ordering and performing treatments and interventions, pulse oximetry, re-evaluation of patient's condition and review of old charts     Medications Ordered in ED Medications  dextrose 10 % infusion ( Intravenous New Bag/Given 11/20/22 1314)  acetaminophen (TYLENOL) tablet 500-1,000 mg (has no administration in time range)  aspirin EC tablet 81 mg (has no administration in time range)  lisinopril (ZESTRIL) tablet 2.5 mg (has no administration in time range)  rosuvastatin (CRESTOR) tablet 40 mg (has no administration in time range)  levothyroxine (SYNTHROID) tablet 50 mcg (has no administration in  time range)  pantoprazole (PROTONIX) EC tablet 40 mg (has no administration in time range)  topiramate (TOPAMAX) tablet 50 mg (has no administration in time range)  insulin aspart (novoLOG) injection 0-9 Units (has no administration in time range)  heparin injection 5,000 Units (has no administration in time range)  LORazepam (ATIVAN) 2 MG/ML injection (  Given 11/20/22 1305)  dextrose 50 % solution 50 mL (50 mLs Intravenous Given 11/20/22 1310)    ED Course/ Medical Decision Making/ A&P                          Medical Decision Making Amount and/or Complexity of Data Reviewed Labs: ordered.  Risk Prescription drug management. Decision regarding hospitalization.    This patient presents to the ED for concern of seizure-like activity, this involves an extensive number of treatment options, and is a complaint that carries with it a high risk of complications and morbidity.  I considered the following differential and admission for this acute, potentially life threatening condition. Pt is overall very well-appearing and HDS.  MDM:    Consider seizure-like episode vs syncope vs hypoglycemic episode given that patient had glucose 69 mg/dL and his AMS/transient alteration of awareness resolved w/ administration of intraoral glucose. Per chart review he has had o/p EEGs neg for epileptic activity before. Consider other causes of syncope or seizure-like activity including electrolyte derangements, underlying epilepsy, toxic ingestion no no recent changes to meds, uremia, hyperthyroidism though no associated symptoms.  No tremor or report of alcohol withdrawal.  Consider possible complex partial seizures or nonepileptic seizures.  Patient is currently asymptomatic with no headache or focal neurodeficits, low concern for intracranial cause such as ICH or hydrocephalus.   Clinical Course as of 11/20/22 1609  Tue Nov 20, 2022  1308 Pt with episode of seizure-like activity. His tongue appears to be  rolling back in his mouth, he is staring and decreased responsiveness. BG at this time is 51 mg/dL. Will give amp of D50 and start on D10 gtt.  [HN]  1325 Pt with resolution of symptoms after amp of D50. Pt with likely hypoglycemic episode causing symptoms. Other w/u including Mg, CMP, lactate, and CBC are unrevealing. Will start CBG monitoring q1h and consulted to hospitalist for admission.  [HN]    Clinical Course  User Index [HN] Loetta Rough, MD    Labs: I Ordered, and personally interpreted labs.  The pertinent results include: Those listed above  Additional history obtained from chart review, wife at bedside.    Cardiac Monitoring: The patient was maintained on a cardiac monitor.  I personally viewed and interpreted the cardiac monitored which showed an underlying rhythm of: Normal sinus rhythm  Reevaluation: After the interventions noted above, I reevaluated the patient and found that they have :worsened  Social Determinants of Health: Patient lives independently   Disposition:  Admit on D10 gtt  Co morbidities that complicate the patient evaluation  Past Medical History:  Diagnosis Date   ADHD (attention deficit hyperactivity disorder)    Diabetes mellitus    Hyperlipidemia    Hypertension    Renal disorder    Seizures (HCC)    Stroke (HCC)      Medicines Meds ordered this encounter  Medications   LORazepam (ATIVAN) 2 MG/ML injection    Deneen Harts, [place] D: cabinet override   dextrose 50 % solution 50 mL   dextrose 10 % infusion   acetaminophen (TYLENOL) tablet 500-1,000 mg   aspirin EC tablet 81 mg    Swallow whole.     lisinopril (ZESTRIL) tablet 2.5 mg   rosuvastatin (CRESTOR) tablet 40 mg   levothyroxine (SYNTHROID) tablet 50 mcg   pantoprazole (PROTONIX) EC tablet 40 mg   topiramate (TOPAMAX) tablet 50 mg   insulin aspart (novoLOG) injection 0-9 Units    Order Specific Question:   Correction coverage:    Answer:   Sensitive (thin, NPO, renal)     Order Specific Question:   CBG < 70:    Answer:   Implement Hypoglycemia Standing Orders and refer to Hypoglycemia Standing Orders sidebar report    Order Specific Question:   CBG 70 - 120:    Answer:   0 units    Order Specific Question:   CBG 121 - 150:    Answer:   1 unit    Order Specific Question:   CBG 151 - 200:    Answer:   2 units    Order Specific Question:   CBG 201 - 250:    Answer:   3 units    Order Specific Question:   CBG 251 - 300:    Answer:   5 units    Order Specific Question:   CBG 301 - 350:    Answer:   7 units    Order Specific Question:   CBG 351 - 400    Answer:   9 units    Order Specific Question:   CBG > 400    Answer:   call MD and obtain STAT lab verification   heparin injection 5,000 Units    I have reviewed the patients home medicines and have made adjustments as needed  Problem List / ED Course: Problem List Items Addressed This Visit   None Visit Diagnoses     Seizure-like activity (HCC)    -  Primary   Hypoglycemic event in diabetes (HCC)       Relevant Medications   aspirin EC tablet 81 mg (Start on 11/21/2022 10:00 AM)   lisinopril (ZESTRIL) tablet 2.5 mg (Start on 11/21/2022 10:00 AM)   rosuvastatin (CRESTOR) tablet 40 mg   insulin aspart (novoLOG) injection 0-9 Units (Start on 11/20/2022  5:00 PM)                   This  note was created using dictation software, which may contain spelling or grammatical errors.    Loetta Rough, MD 11/20/22 [phone]

## 2022-11-20 NOTE — ED Notes (Signed)
ED TO INPATIENT HANDOFF REPORT  ED Nurse Name and Phone #: Kyesha Balla [phone]   S Name/Age/Gender Jeff [name] 72 y.o. male Room/Bed: 043C/043C  Code Status   Code Status: Full Code  Home/SNF/Other Home Patient oriented to: self, place, time, and situation Is this baseline? Yes   Triage Complete: Triage complete  Chief Complaint Hypoglycemia [E16.2]  Triage Note Pt came by EMS . Pt was from outpatient neuro rehab for past stroke and had 2 seizures back to back that lasted 1 minute. EMS gave D10, bs was 69. axox4   Allergies Allergies  Allergen Reactions   Flexeril [Cyclobenzaprine Hcl] Other (See Comments)    Causes him to pass out and lowers his BP   Lidocaine Swelling    throat   Novocain [Procaine Hcl] Swelling    Level of Care/Admitting Diagnosis ED Disposition     ED Disposition  Admit   Condition  --   Comment  Hospital Area: [facility] [[id number]]  Level of Care: Telemetry Medical [104]  May place patient in observation at [facility] or [facility] if equivalent level of care is available:: No  Covid Evaluation: Asymptomatic - no recent exposure (last 10 days) testing not required  Diagnosis: Hypoglycemia [161096]  Admitting Physician: Emeline General [[id number]]  Attending Physician: Emeline General [[id number]]          B Medical/Surgery History Past Medical History:  Diagnosis Date   ADHD (attention deficit hyperactivity disorder)    Diabetes mellitus    Hyperlipidemia    Hypertension    Renal disorder    Seizures (HCC)    Stroke (HCC)    Past Surgical History:  Procedure Laterality Date   IR ANGIO INTRA EXTRACRAN SEL COM CAROTID INNOMINATE BILAT MOD SED  01/03/2021   IR ANGIO VERTEBRAL SEL SUBCLAVIAN INNOMINATE UNI L MOD SED  01/03/2021   IR ANGIO VERTEBRAL SEL VERTEBRAL UNI R MOD SED  01/03/2021   IR RADIOLOGIST EVAL & MGMT  01/13/2021   IR US GUIDE VASC ACCESS RIGHT  01/03/2021   NECK SURGERY     SPINE SURGERY     Cervical spine x 2;  Vear Clock; Critzer.     A IV Location/Drains/Wounds Patient Lines/Drains/Airways Status     Active Line/Drains/Airways     Name Placement date Placement time Site Days   Peripheral IV 11/20/22 20 G Anterior;Left Hand 11/20/22  1152  Hand  less than 1            Intake/Output Last 24 hours No intake or output data in the 24 hours ending 11/20/22 1538  Labs/Imaging Results for orders placed or performed during the hospital encounter of 11/20/22 (from the past 48 hour(s))  CBC with Differential     Status: None   Collection Time: 11/20/22 11:47 AM  Result Value Ref Range   WBC 6.8 4.0 - 10.5 K/uL   RBC 5.23 4.22 - 5.81 MIL/uL   Hemoglobin 15.1 13.0 - 17.0 g/dL   HCT 29.5 62.1 - 30.8 %   MCV 85.5 80.0 - 100.0 fL   MCH 28.9 26.0 - 34.0 pg   MCHC 33.8 30.0 - 36.0 g/dL   RDW 65.7 84.6 - 96.2 %   Platelets 199 150 - 400 K/uL   nRBC 0.0 0.0 - 0.2 %   Neutrophils Relative % 69 %   Neutro Abs 4.7 1.7 - 7.7 K/uL   Lymphocytes Relative 17 %   Lymphs Abs 1.2 0.7 - 4.0 K/uL  Monocytes Relative 10 %   Monocytes Absolute 0.7 0.1 - 1.0 K/uL   Eosinophils Relative 3 %   Eosinophils Absolute 0.2 0.0 - 0.5 K/uL   Basophils Relative 1 %   Basophils Absolute 0.1 0.0 - 0.1 K/uL   Immature Granulocytes 0 %   Abs Immature Granulocytes 0.02 0.00 - 0.07 K/uL    Comment: Performed at [facility], [address] [place], [place] [address]  Comprehensive metabolic panel     Status: Abnormal   Collection Time: 11/20/22 11:47 AM  Result Value Ref Range   Sodium 136 135 - 145 mmol/L   Potassium 3.5 3.5 - 5.1 mmol/L   Chloride 104 98 - 111 mmol/L   CO2 17 (L) 22 - 32 mmol/L   Glucose, Bld 110 (H) 70 - 99 mg/dL    Comment: Glucose reference range applies only to samples taken after fasting for at least 8 hours.   BUN 25 (H) 8 - 23 mg/dL   Creatinine, Ser 4.78 (H) 0.61 - 1.24 mg/dL   Calcium 9.1 8.9 - 29.5 mg/dL   Total Protein 6.5 6.5 - 8.1 g/dL   Albumin 3.8 3.5 - 5.0 g/dL   AST  21 15 - 41 U/L   ALT 24 0 - 44 U/L   Alkaline Phosphatase 51 38 - 126 U/L   Total Bilirubin 0.5 0.3 - 1.2 mg/dL   GFR, Estimated 45 (L) >60 mL/min    Comment: (NOTE) Calculated using the CKD-EPI Creatinine Equation (2021)    Anion gap 15 5 - 15    Comment: Performed at [facility], [address], [place], [place] [address]  Magnesium     Status: None   Collection Time: 11/20/22 11:47 AM  Result Value Ref Range   Magnesium 2.0 1.7 - 2.4 mg/dL    Comment: Performed at [facility], [address], [place], [place] [address]  Rapid urine drug screen (hospital performed)     Status: None   Collection Time: 11/20/22 11:47 AM  Result Value Ref Range   Opiates NONE DETECTED NONE DETECTED   Cocaine NONE DETECTED NONE DETECTED   Benzodiazepines NONE DETECTED NONE DETECTED   Amphetamines NONE DETECTED NONE DETECTED   Tetrahydrocannabinol NONE DETECTED NONE DETECTED   Barbiturates NONE DETECTED NONE DETECTED    Comment: (NOTE) DRUG SCREEN FOR MEDICAL PURPOSES ONLY.  IF CONFIRMATION IS NEEDED FOR ANY PURPOSE, NOTIFY LAB WITHIN 5 DAYS.  LOWEST DETECTABLE LIMITS FOR URINE DRUG SCREEN Drug Class                     Cutoff (ng/mL) Amphetamine and metabolites    1000 Barbiturate and metabolites    200 Benzodiazepine                 200 Opiates and metabolites        300 Cocaine and metabolites        300 THC                            50 Performed at [facility], [address], [place], [place] [address]   CBG monitoring, ED     Status: Abnormal   Collection Time: 11/20/22 11:59 AM  Result Value Ref Range   Glucose-Capillary 184 (H) 70 - 99 mg/dL    Comment: Glucose reference range applies only to samples taken after fasting for at least 8 hours.  Lactic acid, plasma     Status: None   Collection Time: 11/20/22 12:00 PM  Result Value Ref Range   Lactic Acid, Venous 1.6 0.5 - 1.9 mmol/L    Comment: Performed at [facility], [address]  [place], [place] [address]  CBG monitoring, ED     Status: Abnormal   Collection Time: 11/20/22  1:06 PM  Result Value Ref Range   Glucose-Capillary 51 (L) 70 - 99 mg/dL    Comment: Glucose reference range applies only to samples taken after fasting for at least 8 hours.  Hemoglobin A1c     Status: Abnormal   Collection Time: 11/20/22  2:00 PM  Result Value Ref Range   Hgb A1c MFr Bld 6.7 (H) 4.8 - 5.6 %    Comment: (NOTE) Pre diabetes:          5.7%-6.4%  Diabetes:              >6.4%  Glycemic control for   <7.0% adults with diabetes    Mean Plasma Glucose 145.59 mg/dL    Comment: Performed at [facility], [address] [place], [place] [address]  CBG monitoring, ED     Status: Abnormal   Collection Time: 11/20/22  2:16 PM  Result Value Ref Range   Glucose-Capillary 124 (H) 70 - 99 mg/dL    Comment: Glucose reference range applies only to samples taken after fasting for at least 8 hours.  I-Stat venous blood gas, ED     Status: Abnormal   Collection Time: 11/20/22  2:38 PM  Result Value Ref Range   pH, Ven 7.369 7.25 - 7.43   pCO2, Ven 33.6 (L) 44 - 60 mmHg   pO2, Ven 69 (H) 32 - 45 mmHg   Bicarbonate 19.4 (L) 20.0 - 28.0 mmol/L   TCO2 20 (L) 22 - 32 mmol/L   O2 Saturation 93 %   Acid-base deficit 5.0 (H) 0.0 - 2.0 mmol/L   Sodium 141 135 - 145 mmol/L   Potassium 3.5 3.5 - 5.1 mmol/L   Calcium, Ion 1.19 1.15 - 1.40 mmol/L   HCT 42.0 39.0 - 52.0 %   Hemoglobin 14.3 13.0 - 17.0 g/dL   Sample type VENOUS    No results found.  Pending Labs Unresulted Labs (From admission, onward)     Start     Ordered   11/21/22 0500  Basic metabolic panel  Tomorrow morning,   R        11/20/22 1400   11/20/22 1400  Blood gas, venous  Once,   R        11/20/22 1400            Vitals/Pain Today's Vitals   11/20/22 1500 11/20/22 1515 11/20/22 1530 11/20/22 1531  BP: 114/75 117/71    Pulse: 80 77 79   Resp: 16 14 19    Temp:    98.9 F (37.2 C)  TempSrc:    Oral  SpO2:  90% 91% 92%   Weight:      Height:      PainSc:        Isolation Precautions No active isolations  Medications Medications  dextrose 10 % infusion ( Intravenous New Bag/Given 11/20/22 1314)  acetaminophen (TYLENOL) tablet 500-1,000 mg (has no administration in time range)  aspirin EC tablet 81 mg (has no administration in time range)  lisinopril (ZESTRIL) tablet 2.5 mg (has no administration in time range)  rosuvastatin (CRESTOR) tablet 40  mg (has no administration in time range)  levothyroxine (SYNTHROID) tablet 50 mcg (has no administration in time range)  pantoprazole (PROTONIX) EC tablet 40 mg (has no administration in time range)  topiramate (TOPAMAX) tablet 50 mg (has no administration in time range)  insulin aspart (novoLOG) injection 0-9 Units (has no administration in time range)  heparin injection 5,000 Units (has no administration in time range)  LORazepam (ATIVAN) 2 MG/ML injection (  Given 11/20/22 1305)  dextrose 50 % solution 50 mL (50 mLs Intravenous Given 11/20/22 1310)    Mobility walks     Focused Assessments Neuro Assessment Handoff:  Swallow screen pass? Yes          Neuro Assessment: Within Defined Limits Neuro Checks:      Has TPA been given? No If patient is a Neuro Trauma and patient is going to OR before floor call report to 4N Charge nurse: [phone] or [phone]   R Recommendations: See Admitting Provider Note  Report given to:   Additional Notes:

## 2022-11-20 NOTE — ED Notes (Signed)
Pt having jerking movement post ativan. MD called to bedside. MD at bedside

## 2022-11-20 NOTE — Therapy (Signed)
OUTPATIENT PHYSICAL THERAPY NEURO TREATMENT NOTE   Patient Name: JOSHUALEE ESTRELA MRN: [id number] DOB:05/07/1951, 72 y.o., male Today's Date: 11/20/2022   PCP: Anson Fret, MD REFERRING PROVIDER: Windell Norfolk, MD  END OF SESSION:  PT End of Session - 11/20/22 1025     Visit Number 8    Number of Visits 13   with eval   Date for PT Re-Evaluation 12/21/22    Authorization Type [employer]    Authorization Time Period 5-24 - 12-21-22    Authorization - Number of Visits 13    Progress Note Due on Visit 10    PT Start Time 1021    PT Stop Time 1115    PT Time Calculation (min) 54 min    Equipment Utilized During Treatment Gait belt    Activity Tolerance Treatment limited secondary to medical complications (Comment)   2 episodes of seizure-like activity, did not come out of 2nd episode so pt left with EMS   Behavior During Therapy [facility] for tasks assessed/performed                    Past Medical History:  Diagnosis Date   ADHD (attention deficit hyperactivity disorder)    Diabetes mellitus    Hyperlipidemia    Hypertension    Renal disorder    Seizures (HCC)    Stroke [facility])    Past Surgical History:  Procedure Laterality Date   IR ANGIO INTRA EXTRACRAN SEL COM CAROTID INNOMINATE BILAT MOD SED  01/03/2021   IR ANGIO VERTEBRAL SEL SUBCLAVIAN INNOMINATE UNI L MOD SED  01/03/2021   IR ANGIO VERTEBRAL SEL VERTEBRAL UNI R MOD SED  01/03/2021   IR RADIOLOGIST EVAL & MGMT  01/13/2021   IR US GUIDE VASC ACCESS RIGHT  01/03/2021   NECK SURGERY     SPINE SURGERY     Cervical spine x 2; Vear Clock; Critzer.   Patient Active Problem List   Diagnosis Date Noted   TIA (transient ischemic attack) 03/10/2020   CKD (chronic kidney disease) 08/28/2018   Retinopathy of both eyes 08/28/2018   Stroke due to embolism of left vertebral artery (HCC) 08/12/2018   Stroke (HCC) 08/08/2018   Diabetes mellitus (HCC) 07/25/2015   Neck injury 06/22/2015   Hearing loss in right ear  10/14/2011   Agent orange exposure 10/14/2011   ADD (attention deficit disorder) 10/14/2011   Hyperlipidemia 10/14/2011   BMI 28.0-28.9,adult 10/14/2011    ONSET DATE: 10/19/2022  REFERRING DIAG: R40.4 (ICD-10-CM) - Transient alteration of awareness  THERAPY DIAG:  Other abnormalities of gait and mobility  Unsteadiness on feet  Muscle weakness (generalized)  Abnormal posture  Hemiplegia and hemiparesis following cerebral infarction affecting right dominant side (HCC)  Rationale for Evaluation and Treatment: Rehabilitation  SUBJECTIVE:  SUBJECTIVE STATEMENT: Pt reports he did have a fall this morning stepping over his dog in a doorway, hit his L shin on the doorframe and his chest and his head. Pt then corrects himself and says he just "grazed" his face on the doorframe during his fall. Pt having some pain in his LLE this morning following the fall. Pt reports he did not end up on the floor with this fall.  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 Pt reports no other falls since last visit other than one this morning. Pt reports no instances of seizure- like activity recently, states his blood sugar is doing well. Pt going to the beach next week.  Pt accompanied by: self and significant other wife [name]  PERTINENT HISTORY: PMH: hypertension, hyperlipidemia, diabetes mellitus, TIA, previous stroke (March 2020), h/o cervical fusion C5-7 (2012), h/o neck injury 2016, stage 3 CKD  Per Dr. [name] note 10/18/22: HISTORY OF PRESENT ILLNESS:  This is a 72 year old gentleman past medical history of hypertension, hyperlipidemia, diabetes mellitus, TIA, previous stroke who is presenting after being admitted to the  hospital for an episode of altered mental status/unresponsiveness.  Patient reports being at home, sitting on recliner and his glucose monitor warned him for low blood sugar.  Wife gave him two sips of soda and then he became unresponsive.  Wife reports eyes were open, tongue rolled back, initially unresponsive to touch and he started flailing.  EMS was called, per chart review when EMS arrived he was still hypoglycemic and he received dextrose.  He was taken to the hospital.  His initial workup including head CT was negative.  Wife reports that he did have episodes similar but not this severe and those episodes were not related to low blood sugar.  He had an EEG a year ago which was negative for any epileptic epileptiform discharge but show mild diffuse slowing.  Since discharge from the hospital he is back to his normal self, no other complaints.   PAIN:  Are you having pain? No  PRECAUTIONS: Fall  WEIGHT BEARING RESTRICTIONS: No  FALLS: Has patient fallen in last 6 months? Yes. Number of falls several but less than one per month; no injuries with falls, able to get back up independently; unsure of cause "just lost my balance"  LIVING ENVIRONMENT: Lives with: lives with their family Lives in: House/apartment Stairs: Yes: External: 2 steps; none (uses his wife for support when navigating stairs) Has following equipment at home: Single point cane, Environmental consultant - 2 wheeled, Wheelchair (manual), and Ramped entry  PLOF: Independent with gait and Independent with transfers  PATIENT GOALS: "I'd like to have better balance, walk better"  OBJECTIVE:  BP assessed in sitting at beginning of session: Vitals:   11/20/22 1033  BP: 114/72  Pulse: 76    TherAct: Resisted gait x 345 ft with green band with focus on multidirectional perturbations, pt able to utilize stepping strategy to prevent fall with no LOB noted, CGA overall for task. Pt does fatiuge after 3rd lap and needs a seated rest break.   Pt  performing Blaze pod alt L/R LE taps to work on SLS stability. Pt performing turns L/R during task, reports onset of of dizziness during 2nd 30 sec round of task. Pt sits down for rest break and for dizziness to subside. Pt states, "I am not doing so well" and then has onset of seizure-like symptoms  where he is not verbally responsive but his eyes are open. This episode lasts about 1 min then pt is able to squeeze his wife's hands and becomes emotional/tearful. Pt begins to come out of this seizure-like episode and states, "My head just feels weird" and rubs his temples. This episode is similar to what this therapist and what pt's wife have witnessed in this clinic before. However, pt's reports of his head feeling weird is a new symptom. BP assessed after this episode: 128/78, HR 79. Pt then has onset again of seizure-like symptoms and again becomes verbally unresponsive and is unable to follow motor commands to squeeze his wife's hands. Per pt's BG monitor his blood sugar is 71. Pt's wife able to rub gel on his gums and pt is able to follow cues to swallow gel. Pt does not come out of this seizure-like state so EMS was called and pt's wife was able to contact patient's neurologist Dr. Teresa Coombs from [facility] office next door and he is able to come to the clinic to witness patient's symptoms. Seated BP assessed to be 142/97, HR 97, SpO2 97% after about 7 min of being in this seizure-like state. Pt remained seated in chair with arms during this encounter. Pt is able to stand with assist x 2 and is total A to transfer to stretcher with EMS. Pt handed off to EMS after about 15 min of being in this state.   PATIENT EDUCATION: Education details: N/A due to pt leaving with EMS Person educated: Patient and Spouse Education method: Explanation Education comprehension:  N/A due to pt leaving with EMS  HOME EXERCISE PROGRAM: Access Code: [email or web address] URL: [email or web address] Date: 10/30/2022 Prepared by:  Maebelle Munroe  Exercises - Standing Hip Flexion with Counter Support  - 1 x daily - 7 x weekly - 1 sets - 10 reps - Standing March with Counter Support  - 1 x daily - 7 x weekly - 3 sets - 10 reps - Standing Hip Extension with Counter Support  - 1 x daily - 7 x weekly - 3 sets - 10 reps - Standing Hip Abduction with Counter Support  - 1 x daily - 7 x weekly - 1 sets - 10 reps - Standing with Eyes Open  - 1 x daily - 7 x weekly - 1 sets - 5 reps - Sit to Stand with Counter Support  - 1 x daily - 7 x weekly - 1 sets - 10 reps - Supine Active Straight Leg Raise  - 1 x daily - 7 x weekly - 1 sets - 10 reps - Sidelying Hip Abduction  - 1 x daily - 7 x weekly - 1 sets - 10 reps - Clamshell with Resistance  - 1 x daily - 7 x weekly - 1 sets - 10 reps - 3 sec hold - Supine Knee to Chest with Leg Straight  - 1 x daily - 7 x weekly - 1 sets - 10 reps  Walking Program: Walk with your family. Walk during cooler times of the day. Starting this week, walk 1 min per day for 7 days. Following week add 1 minutes to your total time. Week 1: 1 min Week 2: 2 min Week 3: 3 min Week 4: 4 min...Marland KitchenMarland KitchenUntil you can walk to 15 min per day    GOALS: Goals reviewed with patient? No  SHORT TERM GOALS: Target date: 11/16/2022  Pt will be independent with initial HEP for improved strength, balance, transfers and gait. Baseline: Goal  status: Goal met 11-13-22  2.  FGA assessment to be completed and STG set;  Increase FGA score to >/=11/30  Baseline: Initial score 7/30:     score 13/30 on 11-15-22 Goal status: Goal met   3.  Pt will improve gait velocity to at least 2.0 ft/sec for improved gait efficiency and performance at SBA level  Baseline: 1.74 ft/sec CGA (5/24);  11.78 , 14.00 = 2.78 ft/sec (11.78 secs) with no device Goal status:  Goal met  11-13-22   LONG TERM GOALS: Target date: 12/07/2022   Pt will be independent with final HEP for improved strength, balance, transfers and gait. Baseline:  Goal status:  INITIAL  2.  FGA assessment to be completed and LTG set; increased FGA score to >/= 15/30 to increase safety with gait. Baseline:  Goal status: INITIAL  3.  Pt will improve gait velocity to at least 2.25 ft/sec for improved gait efficiency and performance at SBA level  Baseline: 1.74 ft/sec CGA (5/24) Goal status: INITIAL  4.  Pt will improve normal TUG to less than or equal to 13 seconds for improved functional mobility and decreased fall risk. Baseline: 15.72 sec (5/24) Goal status: INITIAL  5.  mCTSIB to be assessed and LTG set;  maintain balance on condition 3 to >/= 20 secs to improve standing balance on compliant surfaces.  Baseline: pt unable to stand with EC on compliant or noncompliant surfaces Goal status: INITIAL   ASSESSMENT:  CLINICAL IMPRESSION: Emphasis of skilled PT session on continuing to work on dynamic standing balance and SLS stability. Pt exhibits good ability to recover balance with use of stepping strategy. Pt does continue to exhibit impaired balance in SLS and continues to benefit from practice of this in order to decrease his fall risk. Pt limited this session by onset of several seizure-like episodes, see above. Pt left in care of EMS.   OBJECTIVE IMPAIRMENTS: Abnormal gait, decreased activity tolerance, decreased balance, decreased knowledge of condition, difficulty walking, decreased strength, impaired perceived functional ability, and impaired sensation.   ACTIVITY LIMITATIONS: stairs and transfers  PARTICIPATION LIMITATIONS: driving and community activity  PERSONAL FACTORS: Age, Time since onset of injury/illness/exacerbation, and 3+ comorbidities:    hypertension, hyperlipidemia, diabetes mellitus, TIA, previous stroke (March 2020), h/o cervical fusion C5-7 (2012), h/o neck injury 2016, stage 3 CKD are also affecting patient's functional outcome.   REHAB POTENTIAL: Good  CLINICAL DECISION MAKING: Stable/uncomplicated  EVALUATION COMPLEXITY:  Moderate  PLAN:  PT FREQUENCY: 2x/week  PT DURATION: 6 weeks  PLANNED INTERVENTIONS: Therapeutic exercises, Therapeutic activity, Neuromuscular re-education, Balance training, Gait training, Patient/Family education, Self Care, Joint mobilization, Stair training, Vestibular training, Canalith repositioning, Visual/preceptual remediation/compensation, Orthotic/Fit training, DME instructions, Aquatic Therapy, Electrical stimulation, Manual therapy, and Re-evaluation   PLAN FOR NEXT SESSION:   Continue balance &  and gait training, SLS stability, RLE strengthening    Peter Congo, PT, DPT, CSRS 11/20/2022, 11:42 AM

## 2022-11-20 NOTE — Progress Notes (Signed)
Patient had an event while in rehab. I was able to witness it. When I arrived, he was sitting on the chair, eyes open, mouth open, unresponsive, but he did blink to loud noise (hands clapping). His initial vitals were normal, normal pressure, heart rate and pulse except for a slight low glucose level at 71.  There were no abnormal movements, no focal weakness but  he was generally weak. He was able to hold arms at least antigravity for a few seconds. The episode lasted about 10 minutes. EMS was called and patient transported to [facility]. He was still unresponsive. I do suspect an episode of focal seizures vs. Hypoglycemia (even though though glucose not critically low).    Windell Norfolk, MD [facility]

## 2022-11-20 NOTE — ED Notes (Signed)
Patient provided urinal, urine drug screen to be sent down.

## 2022-11-20 NOTE — H&P (Signed)
History and Physical    Jeff [name] ZHY:[id number] DOB: 12-15-1950 DOA: 11/20/2022  PCP: Anson Fret, MD (Confirm with patient/family/NH records and if not entered, this has to be entered at [facility] point of entry) Patient coming from: Home  I have personally briefly reviewed patient's old medical records in [facility]  Chief Complaint: Altered mentations  HPI: Jeff [name] is a 72 y.o. male with medical history significant of IDDM, HTN, HLD, TIA, seizure disorder, OSA noncompliant with CPAP brought in by family member for altered mentations and seizure-like activity.  Patient was at outpatient rehab center when he suddenly became more responsive with mouth wide open and breathing heavily with loud noise, moderate to sternal rub and patient woke up after about 8 to 10 minutes.  Clinic checked patient's sugar was 71 patient was given some candies and slowly mentation recovered.  EMS arrived and took patient to ED in the ED, patient developed another episode of unresponsiveness and glucose was in the 50s.  Patient was given D50 several rounds and then started on D10 drip.  CT head negative for acute findings.  Family reported that patient has been on insulin for diabetes and recently 3 weeks ago patient was started on Ozempic and so far he received 3 doses and since then patient has decreased appetite and oral intake in general.  And patient does have a continuous glucose meter which showed A1c 7.2 about 4 weeks ago and dropped to 6.7 last week.   Review of Systems: As per HPI otherwise 14 point review of systems negative.    Past Medical History:  Diagnosis Date   ADHD (attention deficit hyperactivity disorder)    Diabetes mellitus    Hyperlipidemia    Hypertension    Renal disorder    Seizures (HCC)    Stroke [facility])     Past Surgical History:  Procedure Laterality Date   IR ANGIO INTRA EXTRACRAN SEL COM CAROTID INNOMINATE BILAT MOD SED  01/03/2021   IR ANGIO VERTEBRAL SEL  SUBCLAVIAN INNOMINATE UNI L MOD SED  01/03/2021   IR ANGIO VERTEBRAL SEL VERTEBRAL UNI R MOD SED  01/03/2021   IR RADIOLOGIST EVAL & MGMT  01/13/2021   IR US GUIDE VASC ACCESS RIGHT  01/03/2021   NECK SURGERY     SPINE SURGERY     Cervical spine x 2; Vear Clock; Critzer.     reports that he has never smoked. He has never used smokeless tobacco. He reports that he does not drink alcohol and does not use drugs.  Allergies  Allergen Reactions   Flexeril [Cyclobenzaprine Hcl] Other (See Comments)    Causes him to pass out and lowers his BP   Lidocaine Swelling    throat   Novocain [Procaine Hcl] Swelling    Family History  Problem Relation Age of Onset   Heart disease Mother        CHF   Diabetes Mother    Heart disease Father    Cancer Brother      Prior to Admission medications   Medication Sig Start Date End Date Taking? Authorizing Provider  acetaminophen (TYLENOL) 500 MG tablet Take 500-1,000 mg by mouth every 6 (six) hours as needed for headache.    [provider]  aspirin EC 81 MG EC tablet Take 1 tablet (81 mg total) by mouth daily. Swallow whole. 01/04/21   Joseph Art, DO  Cholecalciferol (VITAMIN D) 50 MCG (2000 UT) tablet Take 2,000 Units by mouth daily.  [provider]  empagliflozin (JARDIANCE) 25 MG TABS tablet TAKE ONE-HALF TABLET BY MOUTH EVERY MORNING FOR DIABETES; THIS REPLACES METFORMIN XR. 02/09/21   [provider]  Glucagon 1 MG/0.2ML SOAJ Inject 1 mg into the skin as needed (low blood sugar).    [provider]  glucose blood (ONETOUCH VERIO) test strip 1 each by Other route 4 (four) times daily -  before meals and at bedtime. dexcom 6 01/03/21   Joseph Art, DO  insulin aspart (NOVOLOG FLEXPEN) 100 UNIT/ML FlexPen 23 Units 3 (three) times daily as needed for high blood sugar. Sliding scale per endocrinologist    [provider]  insulin glargine (LANTUS) 100 UNIT/ML injection Inject 0.45-0.55 mLs (45-55 Units total)  into the skin 2 (two) times daily. Inject 45 units in the morning and 55 units at bedtime Patient taking differently: Inject into the skin 2 (two) times daily. Inject 40 units in the morning and 40 units at bedtime 01/03/21   Marlin Canary U, DO  Insulin Syringe-Needle U-100 (B-D INS SYR ULTRAFINE 1CC/31G) 31G X 5/16" 1 ML MISC Used to inject insulin twice daily. 08/16/17   Romero Belling, MD  levothyroxine (SYNTHROID) 50 MCG tablet Take 50 mcg by mouth daily before breakfast.    [provider]  lisinopril (ZESTRIL) 2.5 MG tablet Take 1 tablet (2.5 mg total) by mouth daily. 01/09/21   Joseph Art, DO  Magnesium Oxide 420 MG TABS Take 1 tablet by mouth in the morning and at bedtime.    [provider]  Multiple Vitamins-Minerals (ICAPS AREDS 2 PO) Take 1 capsule by mouth in the morning and at bedtime.    [provider]  OZEMPIC, 1 MG/DOSE, 4 MG/3ML SOPN Inject 3 mg into the skin once a week. 11/28/21   [provider]  pantoprazole (PROTONIX) 40 MG tablet Take 1 tablet (40 mg total) by mouth daily. 01/03/21   Joseph Art, DO  rosuvastatin (CRESTOR) 40 MG tablet TAKE 1 TABLET BY MOUTH DAILY 08/28/18   Lezlie Lye, Meda Coffee, MD  topiramate (TOPAMAX) 50 MG tablet Take 1 tablet (50 mg total) by mouth 2 (two) times daily. 07/03/22   Ihor Austin, NP    Physical Exam: Vitals:   11/20/22 1139 11/20/22 1145 11/20/22 1200  BP:  (!) 116/103 127/82  Pulse:  80 73  Resp:  12 15  SpO2:  100% 95%  Weight: 87.1 kg    Height: 5\' 8"  (1.727 m)      Constitutional: NAD, calm, comfortable Vitals:   11/20/22 1139 11/20/22 1145 11/20/22 1200  BP:  (!) 116/103 127/82  Pulse:  80 73  Resp:  12 15  SpO2:  100% 95%  Weight: 87.1 kg    Height: 5\' 8"  (1.727 m)     Eyes: PERRL, lids and conjunctivae normal ENMT: Mucous membranes are moist. Posterior pharynx clear of any exudate or lesions.Normal dentition.  Neck: normal, supple, no masses, no thyromegaly Respiratory: clear  to auscultation bilaterally, no wheezing, no crackles. Normal respiratory effort. No accessory muscle use.  Cardiovascular: Regular rate and rhythm, no murmurs / rubs / gallops. No extremity edema. 2+ pedal pulses. No carotid bruits.  Abdomen: no tenderness, no masses palpated. No hepatosplenomegaly. Bowel sounds positive.  Musculoskeletal: no clubbing / cyanosis. No joint deformity upper and lower extremities. Good ROM, no contractures. Normal muscle tone.  Skin: no rashes, lesions, ulcers. No induration Neurologic: CN 2-12 grossly intact. Sensation intact, DTR normal. Strength 5/5 in all 4.  Psychiatric: Sleepy, easily arousable, confused    Labs on Admission: I have personally reviewed following labs and imaging studies  CBC: Recent Labs  Lab 11/20/22 1147  WBC 6.8  NEUTROABS 4.7  HGB 15.1  HCT 44.7  MCV 85.5  PLT 199   Basic Metabolic Panel: Recent Labs  Lab 11/20/22 1147  NA 136  K 3.5  CL 104  CO2 17*  GLUCOSE 110*  BUN 25*  CREATININE 1.60*  CALCIUM 9.1  MG 2.0   GFR: Estimated Creatinine Clearance: 44.1 mL/min (A) (by C-G formula based on SCr of 1.6 mg/dL (H)). Liver Function Tests: Recent Labs  Lab 11/20/22 1147  AST 21  ALT 24  ALKPHOS 51  BILITOT 0.5  PROT 6.5  ALBUMIN 3.8   No results for input(s): "LIPASE", "AMYLASE" in the last 168 hours. No results for input(s): "AMMONIA" in the last 168 hours. Coagulation Profile: No results for input(s): "INR", "PROTIME" in the last 168 hours. Cardiac Enzymes: No results for input(s): "CKTOTAL", "CKMB", "CKMBINDEX", "TROPONINI" in the last 168 hours. BNP (last 3 results) No results for input(s): "PROBNP" in the last 8760 hours. HbA1C: No results for input(s): "HGBA1C" in the last 72 hours. CBG: Recent Labs  Lab 11/20/22 1159 11/20/22 1306  GLUCAP 184* 51*   Lipid Profile: No results for input(s): "CHOL", "HDL", "LDLCALC", "TRIG", "CHOLHDL", "LDLDIRECT" in the last 72 hours. Thyroid Function  Tests: No results for input(s): "TSH", "T4TOTAL", "FREET4", "T3FREE", "THYROIDAB" in the last 72 hours. Anemia Panel: No results for input(s): "VITAMINB12", "FOLATE", "FERRITIN", "TIBC", "IRON", "RETICCTPCT" in the last 72 hours. Urine analysis:    Component Value Date/Time   COLORURINE YELLOW 01/01/2021 1911   APPEARANCEUR CLEAR 01/01/2021 1911   LABSPEC 1.018 01/01/2021 1911   PHURINE 6.0 01/01/2021 1911   GLUCOSEU 50 (A) 01/01/2021 1911   HGBUR NEGATIVE 01/01/2021 1911   BILIRUBINUR NEGATIVE 01/01/2021 1911   BILIRUBINUR negative 07/29/2018 0930   BILIRUBINUR negative 01/16/2017 1553   KETONESUR NEGATIVE 01/01/2021 1911   PROTEINUR 30 (A) 01/01/2021 1911   UROBILINOGEN 0.2 07/29/2018 0930   NITRITE NEGATIVE 01/01/2021 1911   LEUKOCYTESUR NEGATIVE 01/01/2021 1911    Radiological Exams on Admission: No results found.  EKG: Independently reviewed. Sinus, no significant ST-T changes.  Assessment/Plan Principal Problem:   Hypoglycemia Active Problems:   CKD (chronic kidney disease)   Uncontrolled type 2 diabetes mellitus with hypoglycemia, with long-term current use of insulin (HCC)   Acute encephalopathy  (please populate well all problems here in Problem List. (For example, if patient is on BP meds at home and you resume or decide to hold them, it is a problem that needs to be her. Same for CAD, COPD, HLD and so on)  Acute metabolic encephalopathy -Likely 2/2 hypoglycemia, probably mismatch of insulin and recent calorie intake after starting Ozempic, with evidenced by continuous glucose meter record showed last week A1c 6.7.  Recommend he cut down long-acting insulin as he also has CKD.  And family agreed.  For now we will hold off for home dosage of insulin.  Start renal SSI. -Other DDx, neurology is working up to rule out breakthrough seizure.  For now, continue Topamax.  Hypoglycemia -Continue D10 drip for now -Renal dosed SSI   Seizure disorder -EEG as per  neurology -Continue Topamax  CKD stage II -Euvolemic, creatinine level stable.  HLD -Stable.  DVT prophylaxis: Heparin subcu Code Status: Full Family Communication: Wife at bedside Disposition Plan: Expect less than 2 midnight hospital stay Consults called:  Neurology Admission status: Telemetry observation   Emeline General MD [facility] Pager [phone] 11/20/2022, 2:02 PM

## 2022-11-21 ENCOUNTER — Encounter: Payer: Self-pay | Admitting: Physical Therapy

## 2022-11-21 ENCOUNTER — Observation Stay (HOSPITAL_COMMUNITY): Payer: No Typology Code available for payment source

## 2022-11-21 DIAGNOSIS — Z7982 Long term (current) use of aspirin: Secondary | ICD-10-CM | POA: Diagnosis not present

## 2022-11-21 DIAGNOSIS — N179 Acute kidney failure, unspecified: Secondary | ICD-10-CM | POA: Diagnosis not present

## 2022-11-21 DIAGNOSIS — I129 Hypertensive chronic kidney disease with stage 1 through stage 4 chronic kidney disease, or unspecified chronic kidney disease: Secondary | ICD-10-CM | POA: Diagnosis present

## 2022-11-21 DIAGNOSIS — R569 Unspecified convulsions: Secondary | ICD-10-CM

## 2022-11-21 DIAGNOSIS — Z8673 Personal history of transient ischemic attack (TIA), and cerebral infarction without residual deficits: Secondary | ICD-10-CM | POA: Diagnosis not present

## 2022-11-21 DIAGNOSIS — E785 Hyperlipidemia, unspecified: Secondary | ICD-10-CM | POA: Diagnosis present

## 2022-11-21 DIAGNOSIS — Z91199 Patient's noncompliance with other medical treatment and regimen due to unspecified reason: Secondary | ICD-10-CM | POA: Diagnosis not present

## 2022-11-21 DIAGNOSIS — Z7985 Long-term (current) use of injectable non-insulin antidiabetic drugs: Secondary | ICD-10-CM | POA: Diagnosis not present

## 2022-11-21 DIAGNOSIS — E039 Hypothyroidism, unspecified: Secondary | ICD-10-CM | POA: Diagnosis present

## 2022-11-21 DIAGNOSIS — Z8249 Family history of ischemic heart disease and other diseases of the circulatory system: Secondary | ICD-10-CM | POA: Diagnosis not present

## 2022-11-21 DIAGNOSIS — Z79899 Other long term (current) drug therapy: Secondary | ICD-10-CM | POA: Diagnosis not present

## 2022-11-21 DIAGNOSIS — E669 Obesity, unspecified: Secondary | ICD-10-CM | POA: Diagnosis present

## 2022-11-21 DIAGNOSIS — E11649 Type 2 diabetes mellitus with hypoglycemia without coma: Secondary | ICD-10-CM | POA: Diagnosis present

## 2022-11-21 DIAGNOSIS — Z833 Family history of diabetes mellitus: Secondary | ICD-10-CM | POA: Diagnosis not present

## 2022-11-21 DIAGNOSIS — Z7984 Long term (current) use of oral hypoglycemic drugs: Secondary | ICD-10-CM | POA: Diagnosis not present

## 2022-11-21 DIAGNOSIS — E1122 Type 2 diabetes mellitus with diabetic chronic kidney disease: Secondary | ICD-10-CM | POA: Diagnosis present

## 2022-11-21 DIAGNOSIS — G40909 Epilepsy, unspecified, not intractable, without status epilepticus: Secondary | ICD-10-CM | POA: Diagnosis present

## 2022-11-21 DIAGNOSIS — Z6829 Body mass index (BMI) 29.0-29.9, adult: Secondary | ICD-10-CM | POA: Diagnosis not present

## 2022-11-21 DIAGNOSIS — G4733 Obstructive sleep apnea (adult) (pediatric): Secondary | ICD-10-CM | POA: Diagnosis present

## 2022-11-21 DIAGNOSIS — Z794 Long term (current) use of insulin: Secondary | ICD-10-CM | POA: Diagnosis not present

## 2022-11-21 DIAGNOSIS — Z79890 Hormone replacement therapy (postmenopausal): Secondary | ICD-10-CM | POA: Diagnosis not present

## 2022-11-21 DIAGNOSIS — G9341 Metabolic encephalopathy: Secondary | ICD-10-CM | POA: Diagnosis present

## 2022-11-21 DIAGNOSIS — F909 Attention-deficit hyperactivity disorder, unspecified type: Secondary | ICD-10-CM | POA: Diagnosis present

## 2022-11-21 DIAGNOSIS — E872 Acidosis, unspecified: Secondary | ICD-10-CM | POA: Diagnosis present

## 2022-11-21 DIAGNOSIS — N182 Chronic kidney disease, stage 2 (mild): Secondary | ICD-10-CM | POA: Diagnosis present

## 2022-11-21 DIAGNOSIS — E162 Hypoglycemia, unspecified: Secondary | ICD-10-CM | POA: Diagnosis present

## 2022-11-21 LAB — GLUCOSE, CAPILLARY
Glucose-Capillary: 167 mg/dL — ABNORMAL HIGH (ref 70–99)
Glucose-Capillary: 214 mg/dL — ABNORMAL HIGH (ref 70–99)
Glucose-Capillary: 166 mg/dL — ABNORMAL HIGH (ref 70–99)
Glucose-Capillary: 150 mg/dL — ABNORMAL HIGH (ref 70–99)
Glucose-Capillary: 221 mg/dL — ABNORMAL HIGH (ref 70–99)

## 2022-11-21 LAB — BASIC METABOLIC PANEL
Anion gap: 8 (ref 5–15)
BUN: 24 mg/dL — ABNORMAL HIGH (ref 8–23)
CO2: 21 mmol/L — ABNORMAL LOW (ref 22–32)
Calcium: 8.8 mg/dL — ABNORMAL LOW (ref 8.9–10.3)
Chloride: 108 mmol/L (ref 98–111)
Creatinine, Ser: 1.74 mg/dL — ABNORMAL HIGH (ref 0.61–1.24)
GFR, Estimated: 41 mL/min — ABNORMAL LOW (ref >60)
Glucose, Bld: 125 mg/dL — ABNORMAL HIGH (ref 70–99)
Potassium: 3.5 mmol/L (ref 3.5–5.1)
Sodium: 137 mmol/L (ref 135–145)

## 2022-11-21 MED ORDER — SODIUM CHLORIDE 0.9 % IV SOLN: INTRAVENOUS | Status: DC

## 2022-11-21 MED ORDER — GADOBUTROL 1 MMOL/ML IV SOLN
8.0000 mL | Freq: Once | INTRAVENOUS | Status: AC | PRN
  Administered 2022-11-21: 8 mL via INTRAVENOUS

## 2022-11-21 NOTE — Plan of Care (Signed)

## 2022-11-21 NOTE — Therapy (Signed)
[facility] [address] [place], [place], [address] Phone: [phone]   Fax:  [phone]  Patient Details  Name: Jeff [name] MRN: [id number] Date of Birth: 03/25/1951 Referring Provider:  No ref. provider found  Encounter Date: 11/21/2022   Patient has been admitted to the hospital following seizure-like episode in outpatient clinic yesterday. Pt to be d/c from OPPT services at this time and will need a new referral to resume therapy services to ensure that he is medically stable to return to services at this level.     Peter Congo, PT, DPT, CSRS  11/21/2022, 11:36 AM  [place] [facility] [address] [place], [place], [address] Phone: [phone]   Fax:  [phone]

## 2022-11-21 NOTE — Progress Notes (Signed)
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 admission with rehab stay subsequently Subsequent admission for stroke versus hypoglycemia 03/2020 Subsequent TIA 01/01/2021 medically managed  Patient was at outpatient rehab center 6/18-had an event with breathing heavily now twice mouth wide open-patient's sugar was checked was 71 patient given candy and mentation recovered patient found to be unresponsive and given several rounds of D50 and started on D10 CT head negative for acute findings Recently had been started on 21 days prior Ozempic resulting in decreased appetite etc.-is on insulin--- is supposed be wearing a continuous glucose monitor  It appears his neurologist Dr. [name] was next-door he witnessed the episode and requested a continuous EEG  Hospital-Problem based course  Hypoglycemia mimicking seizure? EEG in process neurology input appreciated-MRI negative for overt stroke Patient has lost several pounds since being on Ozempic-had not been eating properly day prior to coming into the hospital and had been prescribed this in an effort by his endocrinologist Dr. Cleon Gustin to more tightly control blood sugar We await EEG, but discontinue Ozempic until OV Dr. Cleon Gustin  Moderately well-controlled DM TY 2 Goal glycemia = A1c greater than 6.5 or 7-hesitate to control aggressively given risk of hypoglycemia/hospitalization Hold Jardiance 25 daily Hold Lantus 45-50 twice daily, hold specialist sliding scale at this time-if blood sugars >300 will reinitiate Will stop D10 infusion and observe CBGs as well as allow heart healthy diet  AKI superimposed on CKD 2-is dry and has mild acidosis from renal insufficiency  baseline creatinine anywhere from  1.4-1.5 Placed on NS 75 cc/H  Prior embolic CVA status post tPA 2020 Continue at this time ASA 81, Crestor 40 Presumably on Topamax 2/2 seizure?  Continue 50 twice daily  Hypothyroidism Continue Synthroid 50 every morning  DVT prophylaxis: Heparin Code Status: Full code Family Communication: Discussed with wife/daughter at bedside-they understand plan of care Disposition:  Status is: Observation The patient will require care spanning > 2 midnights and should be moved to inpatient because:   Will require further management and adjustment of insulin needs prior to discharge    Subjective: Awake coherent no distress Looks well feels well moving 4 limbs equally does not seem to have any deficit grossly  Objective: Vitals:   11/20/22 1725 11/20/22 2017 11/21/22 0000 11/21/22 0334  BP: 102/77 102/74 98/65 (!) 91/53  Pulse: 76 77 64 67  Resp: 16 18    Temp: 97.6 F (36.4 C) (!) 97.4 F (36.3 C) 98 F (36.7 C) 98 F (36.7 C)  TempSrc: Oral Oral Oral Oral  SpO2: 97% 97%  97%  Weight:      Height:        Intake/Output Summary (Last 24 hours) at 11/21/2022 8119 Last data filed at 11/20/2022 1900 Gross per 24 hour  Intake 247.8 ml  Output --  Net 247.8 ml   Filed Weights   11/20/22 1139  Weight: 87.1 kg    Examination:  EOMI NCAT no icterus no pallor, vision by direct confrontation is intact Finger-nose-finger intact Chest is clear no wheeze rales rhonchi S1-S2 no murmur-on monitors seems to be sinus, sinus tach Abdomen soft no rebound Straight leg raise is intact Slightly brisk reflexes lower extremities Psych euthymic coherent pleasant  Data Reviewed: personally reviewed   CBC  Component Value Date/Time   WBC 6.8 11/20/2022 1147   RBC 5.23 11/20/2022 1147   HGB 14.3 11/20/2022 1438   HGB 15.9 07/29/2018 0947   HCT 42.0 11/20/2022 1438   HCT 46.4 07/29/2018 0947   PLT 199 11/20/2022 1147   PLT 294 07/29/2018 0947   MCV 85.5 11/20/2022 1147   MCV 84  07/29/2018 0947   MCH 28.9 11/20/2022 1147   MCHC 33.8 11/20/2022 1147   RDW 13.3 11/20/2022 1147   RDW 13.2 07/29/2018 0947   LYMPHSABS 1.2 11/20/2022 1147   LYMPHSABS 1.9 07/29/2018 0947   MONOABS 0.7 11/20/2022 1147   EOSABS 0.2 11/20/2022 1147   EOSABS 0.5 (H) 07/29/2018 0947   BASOSABS 0.1 11/20/2022 1147   BASOSABS 0.1 07/29/2018 0947      Latest Ref Rng & Units 11/21/2022    2:15 AM 11/20/2022    2:38 PM 11/20/2022   11:47 AM  CMP  Glucose 70 - 99 mg/dL 161   096   BUN 8 - 23 mg/dL 24   25   Creatinine 0.45 - 1.24 mg/dL 4.09   8.11   Sodium 914 - 145 mmol/L 137  141  136   Potassium 3.5 - 5.1 mmol/L 3.5  3.5  3.5   Chloride 98 - 111 mmol/L 108   104   CO2 22 - 32 mmol/L 21   17   Calcium 8.9 - 10.3 mg/dL 8.8   9.1   Total Protein 6.5 - 8.1 g/dL   6.5   Total Bilirubin 0.3 - 1.2 mg/dL   0.5   Alkaline Phos 38 - 126 U/L   51   AST 15 - 41 U/L   21   ALT 0 - 44 U/L   24      Radiology Studies: No results found.   Scheduled Meds:  aspirin EC  81 mg Oral Daily   heparin  5,000 Units Subcutaneous Q12H   insulin aspart  0-9 Units Subcutaneous TID WC   levothyroxine  50 mcg Oral QAC breakfast   lisinopril  2.5 mg Oral Daily   pantoprazole  40 mg Oral Daily   rosuvastatin  40 mg Oral Daily   topiramate  50 mg Oral BID   Continuous Infusions:  dextrose 50 mL/hr at 11/20/22 1716     LOS: 0 days   Time spent: 71  Rhetta Mura, MD [facility] To contact the attending provider between 7A-7P or the covering provider during after hours 7P-7A, please log into the web site [email or web address] and access using universal [place] password for that web site. If you do not have the password, please call the hospital operator.  11/21/2022, 7:11 AM

## 2022-11-21 NOTE — Consult Note (Addendum)
Neurology Consultation Reason for Consult: Seizure versus hypoglycemia  Requesting Physician: [name]   CC:   History is obtained from: Chart review, wife and daughter at bedside, patient  HPI: Jeff [name] is a 72 y.o. right-handed man with a past medical history significant for hypertension, hyperlipidemia, diabetes, possible seizure versus recurrent hypoglycemia related events, stroke  He has had multiple episodes of staring spells in the setting of hypoglycemia, though focal seizure has been appropriately raised on the differential.  He follows outpatient with Dr. Teresa Coombs who was able to witness the first event yesterday that the patient had while in rehab. "When I arrived, he was sitting on the chair, eyes open, mouth open, unresponsive, but he did blink to loud noise (hands clapping). His initial vitals were normal, normal pressure, heart rate and pulse except for a slight low glucose level at 71.  There were no abnormal movements, no focal weakness but  he was generally weak. He was able to hold arms at least antigravity for a few seconds. The episode lasted about 10 minutes. EMS was called and patient transported to [facility]. He was still unresponsive. I do suspect an episode of focal seizures vs. Hypoglycemia (even though though glucose not critically low)."  Patient notes he is not as anxious about glucose control as his wife is, and that she worries about both highs and lows more than he does.   Subsequently on arrival to [facility], he had another event and glucose was noted to be 51 at that time.  He was recently started on Ozempic 3 weeks ago and has had decreased appetite and decreased oral intake in that setting with A1c dropped from 7.2 four weeks ago to 6.7 per primary team H&P  Prior workup EEG 03/11/2020, negative EEG 12/28/2021 This is a mildly abnormal EEG due to the presence of mild background and bihemispheric dysfunction which is a nonspecific finding seen in a variety of  degenerative, toxic, metabolic and hypoxic conditions.  No definite epileptiform activity is noted.  EEG 11/08/2022, negative   Most recent brain imaging 01/04/2022: This MRI of the brain without contrast shows the following: T2/FLAIR hyperintense foci in the hemispheres and a focus in the right basal ganglia consistent with chronic microvascular ischemic change, unchanged compared to the 01/01/2021 MRI. Reduced flow void in the V4 segment of the left vertebral artery, also seen on the 2022 MRI. Moderate cortical atrophy, unchanged compared to 2022 MRI. No acute findings.  Prior stroke March 2020 punctate left medullary infarct status post tPA.   ROS: All other review of systems was negative except as noted in the HPI.   Past Medical History:  Diagnosis Date   ADHD (attention deficit hyperactivity disorder)    Diabetes mellitus    Hyperlipidemia    Hypertension    Renal disorder    Seizures (HCC)    Stroke [facility])    Past Surgical History:  Procedure Laterality Date   IR ANGIO INTRA EXTRACRAN SEL COM CAROTID INNOMINATE BILAT MOD SED  01/03/2021   IR ANGIO VERTEBRAL SEL SUBCLAVIAN INNOMINATE UNI L MOD SED  01/03/2021   IR ANGIO VERTEBRAL SEL VERTEBRAL UNI R MOD SED  01/03/2021   IR RADIOLOGIST EVAL & MGMT  01/13/2021   IR US GUIDE VASC ACCESS RIGHT  01/03/2021   NECK SURGERY     SPINE SURGERY     Cervical spine x 2; Vear Clock; Critzer.     Family History  Problem Relation Age of Onset   Heart disease  Mother        CHF   Diabetes Mother    Heart disease Father    Cancer Brother    Current Outpatient Medications  Medication Instructions   acetaminophen (TYLENOL) 500-1,000 mg, Oral, Every 6 hours PRN   aspirin EC 81 mg, Oral, Daily, Swallow whole.   empagliflozin (JARDIANCE) 12.5 mg, Oral, Daily   Glucagon 1 mg, Subcutaneous, As needed   glucose blood (ONETOUCH VERIO) test strip 1 each, Other, 3 times daily before meals & bedtime, dexcom 6   insulin glargine (LANTUS) 45-55 Units,  Subcutaneous, 2 times daily, Inject 45 units in the morning and 55 units at bedtime   Insulin Syringe-Needle U-100 (B-D INS SYR ULTRAFINE 1CC/31G) 31G X 5/16" 1 ML MISC Used to inject insulin twice daily.   levothyroxine (SYNTHROID) 50 mcg, Oral, Daily before breakfast   lisinopril (ZESTRIL) 2.5 mg, Oral, Daily   Magnesium 250 mg, Oral, 2 times daily   Multiple Vitamins-Minerals (ICAPS AREDS 2 PO) 1 capsule, Oral, 2 times daily   NovoLOG FlexPen 0-20 Units, Subcutaneous, See admin instructions, Sliding scale per endocrinologist. (3 times with each meal)<BR>Less than 100 = 0<BR> 100 -150 = 10 units<BR> 150-200 = 15 units<BR>  200-250 = 20 units   Ozempic (1 MG/DOSE) 3 mg, Subcutaneous, Weekly, Thursdays   pantoprazole (PROTONIX) 40 mg, Oral, Daily   rosuvastatin (CRESTOR) 40 MG tablet TAKE 1 TABLET BY MOUTH DAILY   topiramate (TOPAMAX) 50 mg, Oral, 2 times daily   Vitamin D 2,000 Units, Oral, Daily     Social History:  reports that he has never smoked. He has never used smokeless tobacco. He reports that he does not drink alcohol and does not use drugs.   Exam: Current vital signs: BP (!) 91/53   Pulse 67   Temp 98 F (36.7 C) (Oral)   Resp 18   Ht 5\' 8"  (1.727 m)   Wt 87.1 kg   SpO2 97%   BMI 29.19 kg/m  Vital signs in last 24 hours: Temp:  [97.4 F (36.3 C)-98.9 F (37.2 C)] 98 F (36.7 C) (06/19 0334) Pulse Rate:  [64-80] 67 (06/19 0334) Resp:  [12-19] 18 (06/18 2017) BP: (91-127)/(53-103) 91/53 (06/19 0334) SpO2:  [90 %-100 %] 97 % (06/19 0334) Weight:  [87.1 kg] 87.1 kg (06/18 1139)   Physical Exam  Constitutional: Appears well-developed and well-nourished.  Psych: Affect appropriate to situation, irritable about bladder scans but cooperative and redirectable Eyes: No scleral injection HENT: No oropharyngeal obstruction.  MSK: no joint deformities.  Cardiovascular: Normal rate and regular rhythm. Perfusing extremities well Respiratory: Effort normal, non-labored  breathing GI: Soft.  No distension. There is no tenderness.  Skin: Warm dry and intact visible skin  Neuro: Mental Status: Patient is awake, alert, oriented to person, place, month, year, and situation. Patient is able to give a clear and coherent history. No signs of aphasia or neglect other than mild occasional word substitutions (saying insulin instead of sugar) Cranial Nerves: II: Visual Fields are full. Pupils are equal, round, and reactive to light.   III,IV, VI: EOMI without ptosis or diploplia. Saccadic pursuits V: Facial sensation is symmetric to temperature VII: Facial movement is symmetric.  VIII: hearing is intact to voice X: Uvula elevates symmetrically XI: Shoulder shrug is symmetric. XII: tongue is midline without atrophy or fasciculations.  Motor: Tone is normal. Bulk is normal. 5/5 strength was present in all four extremities.  Sensory: Sensation is symmetric to light touch and temperature in the  arms and legs. Cerebellar: FNF and HKS are intact bilaterally Gait:  Deferred    I have reviewed labs in epic and the results pertinent to this consultation are:  Basic Metabolic Panel: Recent Labs  Lab 11/20/22 1147 11/20/22 1438 11/21/22 0215  NA 136 141 137  K 3.5 3.5 3.5  CL 104  --  108  CO2 17*  --  21*  GLUCOSE 110*  --  125*  BUN 25*  --  24*  CREATININE 1.60*  --  1.74*  CALCIUM 9.1  --  8.8*  MG 2.0  --   --     CBC: Recent Labs  Lab 11/20/22 1147 11/20/22 1438  WBC 6.8  --   NEUTROABS 4.7  --   HGB 15.1 14.3  HCT 44.7 42.0  MCV 85.5  --   PLT 199  --     Coagulation Studies: No results for input(s): "LABPROT", "INR" in the last 72 hours.    I have reviewed the images obtained:  MRI brain personally reviewed, agree with radiology:   1. No acute intracranial abnormality or mass. No specific findings to explain seizures. 2. Unchanged moderate chronic small-vessel disease and central pattern of volume loss.  Initial  Recommendations: -MRI brain with without contrast -Routine EEG  Assessment and additional recommendations on review of studies   Given history provided and now 4th EEG without evidence of ictal/interictal activity, favor events to be due to hypoglycemia.   -Outpatient follow up -Euglycemia, medication adjustment and diet counseling per primary team  Brooke Dare MD-PhD [facility] [phone] Available 7 AM to 7 PM, outside these hours please contact Neurologist on call listed on AMION

## 2022-11-21 NOTE — Progress Notes (Signed)
Patient bladder scanned by NT, more than observed, patient urinated and rescanned, observed, educated patient with wife at bedside about bladder scan and Intermittent catheterization to empty bladder, patient refused, MD notified.

## 2022-11-21 NOTE — Progress Notes (Signed)
STAT EEG in complete.- Results pending.

## 2022-11-21 NOTE — Procedures (Signed)
Patient Name: Jeff [name]  MRN: [id number]  Epilepsy Attending: Charlsie Quest  Referring Physician/Provider: Rhetta Mura, MD  Date: 11/21/2022 Duration: 22.53 mins  Patient history: 72 year old male with seizure-like activity described as eyes open, mouth open, unresponsive, did not blink to light noise.  EEG to evaluate for seizure.  Level of alertness: Awake, asleep  AEDs during EEG study: TPM  Technical aspects: This EEG study was done with scalp electrodes positioned according to the 10-20 International system of electrode placement. Electrical activity was reviewed with band pass filter of 1-70Hz , sensitivity of 7 uV/mm, display speed of 22mm/sec with a 60Hz  notched filter applied as appropriate. EEG data were recorded continuously and digitally stored.  Video monitoring was available and reviewed as appropriate.  Description: The posterior dominant rhythm consists of 8-9 Hz activity of moderate voltage (25-35 uV) seen predominantly in posterior head regions, symmetric and reactive to eye opening and eye closing. Sleep was characterized by vertex waves, sleep spindles (12 to 14 Hz), maximal frontocentral region.  Physiologic photic driving was not seen during photic stimulation.  Hyperventilation was not performed.     IMPRESSION: This study is within normal limits. No seizures or epileptiform discharges were seen throughout the recording.  A normal interictal EEG does not exclude the diagnosis of epilepsy.  Mayli Covington Annabelle Harman

## 2022-11-22 ENCOUNTER — Ambulatory Visit: Payer: Medicare PPO | Admitting: Physical Therapy

## 2022-11-22 LAB — BASIC METABOLIC PANEL
Anion gap: 11 (ref 5–15)
BUN: 24 mg/dL — ABNORMAL HIGH (ref 8–23)
CO2: 22 mmol/L (ref 22–32)
Calcium: 9.2 mg/dL (ref 8.9–10.3)
Chloride: 106 mmol/L (ref 98–111)
Creatinine, Ser: 1.75 mg/dL — ABNORMAL HIGH (ref 0.61–1.24)
GFR, Estimated: 41 mL/min — ABNORMAL LOW (ref >60)
Glucose, Bld: 144 mg/dL — ABNORMAL HIGH (ref 70–99)
Potassium: 3.8 mmol/L (ref 3.5–5.1)
Sodium: 139 mmol/L (ref 135–145)

## 2022-11-22 LAB — GLUCOSE, CAPILLARY
Glucose-Capillary: 152 mg/dL — ABNORMAL HIGH (ref 70–99)
Glucose-Capillary: 221 mg/dL — ABNORMAL HIGH (ref 70–99)

## 2022-11-22 MED ORDER — NOVOLOG FLEXPEN 100 UNIT/ML ~~LOC~~ SOPN: 0.0000 [IU] | PEN_INJECTOR | SUBCUTANEOUS | 11 refills | Status: DC

## 2022-11-22 MED ORDER — TAMSULOSIN HCL 0.4 MG PO CAPS
0.4000 mg | ORAL_CAPSULE | Freq: Every day | ORAL | Status: DC
  Administered 2022-11-22: 0.4 mg via ORAL
  Filled 2022-11-22: qty 1

## 2022-11-22 MED ORDER — TAMSULOSIN HCL 0.4 MG PO CAPS: 0.4000 mg | ORAL_CAPSULE | Freq: Every day | ORAL | 0 refills | Status: AC

## 2022-11-22 MED ORDER — GLUCAGON 1 MG/0.2ML ~~LOC~~ SOAJ: 1.0000 mg | SUBCUTANEOUS | 0 refills | Status: AC | PRN

## 2022-11-22 MED ORDER — INSULIN GLARGINE 100 UNIT/ML ~~LOC~~ SOLN: 10.0000 [IU] | Freq: Two times a day (BID) | SUBCUTANEOUS | Status: DC

## 2022-11-22 NOTE — TOC CM/SW Note (Signed)
Patient receives care at [facility]  PCP at [facility] is Anson Fret   [facility] is [facility] [phone] ext [phone]

## 2022-11-22 NOTE — Discharge Summary (Signed)
Physician Discharge Summary  Jeff [name]:[id number] DOB: 28-Jun-1950 DOA: 11/20/2022  PCP: Anson Fret, MD  Admit date: 11/20/2022 Discharge date: 11/22/2022  Time spent: 46 minutes  Recommendations for Outpatient Follow-up:  Multiple medication changes made-please see MAR-discussed clearly with the patient/patient's wife with regards to down titration of insulins-discontinue Ozempic this admission Needs Chem-12 in 24 to 72 hours and then determine next steps with regards to medication dosing-suggest outpatient coordination with Dr. Arrie Aran renal who sees him already  Discharge Diagnoses:  MAIN problem for hospitalization   Hypoglycemia mimicking possible seizure-unfounded on discharge AKI superimposed on CKD 2 ?  Urinary retention-declined Flomax this admission Prior embolic CVA status post tPA 2020 Hypothyroidism  Please see below for itemized issues addressed in HOpsital- refer to other progress notes for clarity if needed  Discharge Condition: Improved  Diet recommendation: Diabetic  Filed Weights   11/20/22 1139  Weight: 87.1 kg    History of present illness:  72 year old male home dwelling HTN ADHD DM TY 2 HLD hypothyroidism obesity BMI 29 (down from 32) Left vertebral artery embolic stroke status post tPA 08/08/2018 admission with rehab stay subsequently Subsequent admission for stroke versus hypoglycemia 03/2020 Subsequent TIA 01/01/2021 medically managed   Patient was at outpatient rehab center 6/18-had an event with breathing heavily n-his mouth was wide open-patient's sugar was checked was 71  patient given candy and mentation recovered --was given D10 as blood sugar was 69 and became more awake oriented Transported to the ED patient found to be unresponsive-had a seizure lasting 2 minutes with left-sided gaze-Ativan 2 mg given and given several rounds of D50 and started on D10 CT head negative for acute findings  Recently had been started on 21 days prior  by his endocrinologist on Ozempic resulting in decreased appetite etc.-is on insulin--according to the wife endocrinology had cut back the dose of long-acting 45 units / 55 units-->40 a.m. 50 p.m. - is supposed be wearing a continuous glucose monitor--wife relates that the low alarm on the continuous monitor is set at 70  In either event, his neurologist Dr. [name] was next-door he witnessed the episode and requested a continuous EEG  Hospital Course:   Hypoglycemia mimicking seizure? EEG in process neurology input appreciated-MRI negative for overt stroke Patient has lost several pounds since being on Ozempic-had not been eating properly day prior to coming into the hospital and had been prescribed this in an effort by his endocrinologist Dr. Cleon Gustin to more tightly control blood sugar EEG was formed epileptologist and neurologist evaluated the patient Opinion was that this was NOT A SEIZURE-this instead was hypoglycemia masquerading as the same-he had no further events during hospital stay and was deemed medically appropriate for discharge on 6/20   Moderately well-controlled DM TY 2 Goal glycemia = A1c greater than 6.5 or 7-hesitate to control aggressively given risk of hypoglycemia/hospitalization Jardiance was resumed at discharge Ozempic will be held until follow-up with Dr. Marcelina Morel will CC her Long discussion with family at bedside-cut back Lantus way back to doses on MAR in addition to adjusted sliding scale downwards significantly and discussed this with the wife We discontinued the D10 on 6/19 and that sugars have responded appropriately and he is in the 200 range He is stable for discharge knows how to count carbs and knows that he needs to follow-up with Dr. Cleon Gustin in about 24 to 72 hours for adjustment of insulins etc.-I have told him that he can go to the beach for his beach trip after  he at least communicates with them at endocrinology office   AKI superimposed on CKD 2-is dry   Patient refused indwelling Foley catheter and I suspect he has some element of BPH I suggested to him to discuss this with his PCP in the outpatient setting and he may benefit from discussion of obtaining PSA etc. etc. with them   Prior embolic CVA status post tPA 2020 Continue at this time ASA 81, Crestor 40 Presumably on Topamax 2/2 seizure?  Continue 50 twice daily   Hypothyroidism Continue Synthroid 50 every morning Recommend TSH H in about 3 to 6 weeks at endocrinology office    Discharge Exam: Vitals:   11/22/22 0537 11/22/22 0822  BP: 101/68 116/67  Pulse: 72 75  Resp:  16  Temp:  97.9 F (36.6 C)  SpO2:  97%    Subj on day of d/c   Awake coherent no distress looks well feels comfortable Ambulatory Eating and drinking fairly well He is passing good urine and tells me that he was not aware that he needed to inform nursing staff when he needed to pass urine He does not complain of any specific dribbling and or any specific full feeling in his bladder after passing urine   General Exam on discharge  EOMI NCAT younger than stated age no icterus no pallor Neck soft supple S1-S2 no murmur Monitor show sinus rhythm Abdomen is soft no rebound no guarding No lower extremity edema   Discharge Instructions    Allergies as of 11/22/2022       Reactions   Flexeril [cyclobenzaprine Hcl] Other (See Comments)   Causes him to pass out and lowers his BP   Lidocaine Swelling   throat   Novocain [procaine Hcl] Swelling        Medication List     STOP taking these medications    Ozempic (1 MG/DOSE) 4 MG/3ML Sopn Generic drug: Semaglutide (1 MG/DOSE)       TAKE these medications    acetaminophen 500 MG tablet Commonly known as: TYLENOL Take 500-1,000 mg by mouth every 6 (six) hours as needed for headache.   aspirin EC 81 MG tablet Take 1 tablet (81 mg total) by mouth daily. Swallow whole.   empagliflozin 25 MG Tabs tablet Commonly known as:  JARDIANCE Take 12.5 mg by mouth daily.   Glucagon 1 MG/0.2ML Soaj Inject 1 mg into the skin as needed (low blood sugar).   ICAPS AREDS 2 PO Take 1 capsule by mouth in the morning and at bedtime.   insulin glargine 100 UNIT/ML injection Commonly known as: LANTUS Inject 0.1 mLs (10 Units total) into the skin 2 (two) times daily. What changed:  how much to take additional instructions   Insulin Syringe-Needle U-100 31G X 5/16" 1 ML Misc Commonly known as: B-D INS SYR ULTRAFINE 1CC/31G Used to inject insulin twice daily.   levothyroxine 50 MCG tablet Commonly known as: SYNTHROID Take 50 mcg by mouth daily before breakfast.   lisinopril 2.5 MG tablet Commonly known as: ZESTRIL Take 1 tablet (2.5 mg total) by mouth daily.   Magnesium 250 MG Tabs Take 250 mg by mouth 2 (two) times daily.   NovoLOG FlexPen 100 UNIT/ML FlexPen Generic drug: insulin aspart Inject 0-20 Units into the skin See admin instructions. Sliding scale per endocrinologist. (3 times with each meal) Less than 100 = 0  100 -150 = 5 units  150-200 = 8 units   200-250 =12 units What changed: additional instructions  OneTouch Verio test strip Generic drug: glucose blood 1 each by Other route 4 (four) times daily -  before meals and at bedtime. dexcom 6   pantoprazole 40 MG tablet Commonly known as: PROTONIX Take 1 tablet (40 mg total) by mouth daily.   rosuvastatin 40 MG tablet Commonly known as: CRESTOR TAKE 1 TABLET BY MOUTH DAILY What changed: when to take this   tamsulosin 0.4 MG Caps capsule Commonly known as: FLOMAX Take 1 capsule (0.4 mg total) by mouth daily. Start taking on: November 23, 2022   topiramate 50 MG tablet Commonly known as: TOPAMAX Take 1 tablet (50 mg total) by mouth 2 (two) times daily.   Vitamin D 50 MCG (2000 UT) tablet Take 2,000 Units by mouth daily.       Allergies  Allergen Reactions   Flexeril [Cyclobenzaprine Hcl] Other (See Comments)    Causes him to pass out and  lowers his BP   Lidocaine Swelling    throat   Novocain [Procaine Hcl] Swelling      The results of significant diagnostics from this hospitalization (including imaging, microbiology, ancillary and laboratory) are listed below for reference.    Significant Diagnostic Studies: EEG adult  Result Date: 11/25/22 Charlsie Quest, MD     2022-11-25 12:22 PM Patient Name: YASIM DERCK MRN: [id number] Epilepsy Attending: Charlsie Quest Referring Physician/Provider: Rhetta Mura, MD Date: 11-25-2022 Duration: 22.53 mins Patient history: 72 year old male with seizure-like activity described as eyes open, mouth open, unresponsive, did not blink to light noise.  EEG to evaluate for seizure. Level of alertness: Awake, asleep AEDs during EEG study: TPM Technical aspects: This EEG study was done with scalp electrodes positioned according to the 10-20 International system of electrode placement. Electrical activity was reviewed with band pass filter of 1-70Hz , sensitivity of 7 uV/mm, display speed of 55mm/sec with a 60Hz  notched filter applied as appropriate. EEG data were recorded continuously and digitally stored.  Video monitoring was available and reviewed as appropriate. Description: The posterior dominant rhythm consists of 8-9 Hz activity of moderate voltage (25-35 uV) seen predominantly in posterior head regions, symmetric and reactive to eye opening and eye closing. Sleep was characterized by vertex waves, sleep spindles (12 to 14 Hz), maximal frontocentral region.  Physiologic photic driving was not seen during photic stimulation.  Hyperventilation was not performed.   IMPRESSION: This study is within normal limits. No seizures or epileptiform discharges were seen throughout the recording. A normal interictal EEG does not exclude the diagnosis of epilepsy. Charlsie Quest   MR BRAIN W WO CONTRAST  Result Date: 11-25-2022 CLINICAL DATA:  Seizure, new-onset, no history of trauma. Neuro deficit,  acute, stroke suspected. EXAM: MRI HEAD WITHOUT AND WITH CONTRAST TECHNIQUE: Multiplanar, multiecho pulse sequences of the brain and surrounding structures were obtained without and with intravenous contrast. CONTRAST:  8mL GADAVIST GADOBUTROL 1 MMOL/ML IV SOLN COMPARISON:  MRI brain 01/04/2022.  Head CT 10/05/2022. FINDINGS: Brain: No acute infarct or hemorrhage. Unchanged moderate chronic small-vessel disease and central pattern of volume loss. No hydrocephalus or extra-axial collection. No foci of abnormal susceptibility. No mass or abnormal enhancement. Symmetric size and signal of the hippocampi. No evidence of cortical dysgenesis. Vascular: Normal flow voids and vessel enhancement. Skull and upper cervical spine: Normal marrow signal and enhancement. Sinuses/Orbits: Unremarkable. Other: None. IMPRESSION: 1. No acute intracranial abnormality or mass. No specific findings to explain seizures. 2. Unchanged moderate chronic small-vessel disease and central pattern of volume loss. Electronically Signed  By: Orvan Falconer M.D.   On: 11/21/2022 10:02   EEG adult  Result Date: 11/08/2022 Windell Norfolk, MD     11/08/2022  6:02 PM History: 72 year old man with transient alteration of awareness EEG classification: Awake and drowsy Description of the recording: The background rhythms of this recording consists of a fairly well modulated medium amplitude alpha rhythm of 9 Hz that is reactive to eye opening and closure. Present in the anterior head region is a 15-20 Hz beta activity. Photic stimulation was performed, did not show any abnormalities. Hyperventilation was also performed, did not show any abnormalities. Drowsiness was manifested by background fragmentation. No abnormal epileptiform discharges seen during this recording. There was no focal slowing. There were no electrographic seizure identified. Abnormality: None Impression: This is a normal EEG recorded while drowsy and awake. No evidence of interictal  epileptiform discharges. Normal EEGs, however, do not rule out epilepsy. Windell Norfolk, MD [facility]    Microbiology: No results found for this or any previous visit (from the past 240 hour(s)).   Labs: Basic Metabolic Panel: Recent Labs  Lab 11/20/22 1147 11/20/22 1438 11/21/22 0215 11/22/22 0357  NA 136 141 137 139  K 3.5 3.5 3.5 3.8  CL 104  --  108 106  CO2 17*  --  21* 22  GLUCOSE 110*  --  125* 144*  BUN 25*  --  24* 24*  CREATININE 1.60*  --  1.74* 1.75*  CALCIUM 9.1  --  8.8* 9.2  MG 2.0  --   --   --    Liver Function Tests: Recent Labs  Lab 11/20/22 1147  AST 21  ALT 24  ALKPHOS 51  BILITOT 0.5  PROT 6.5  ALBUMIN 3.8   No results for input(s): "LIPASE", "AMYLASE" in the last 168 hours. No results for input(s): "AMMONIA" in the last 168 hours. CBC: Recent Labs  Lab 11/20/22 1147 11/20/22 1438  WBC 6.8  --   NEUTROABS 4.7  --   HGB 15.1 14.3  HCT 44.7 42.0  MCV 85.5  --   PLT 199  --    Cardiac Enzymes: No results for input(s): "CKTOTAL", "CKMB", "CKMBINDEX", "TROPONINI" in the last 168 hours. BNP: BNP (last 3 results) No results for input(s): "BNP" in the last 8760 hours.  ProBNP (last 3 results) No results for input(s): "PROBNP" in the last 8760 hours.  CBG: Recent Labs  Lab 11/21/22 1128 11/21/22 1607 11/21/22 2153 11/22/22 0618 11/22/22 1124  GLUCAP 166* 150* 221* 152* 221*       Signed:  Rhetta Mura MD   [facility] 11/22/2022, 11:56 AM

## 2022-12-04 ENCOUNTER — Ambulatory Visit: Payer: Medicare PPO | Admitting: Physical Therapy

## 2022-12-07 ENCOUNTER — Ambulatory Visit: Payer: Medicare PPO | Admitting: Physical Therapy

## 2022-12-11 ENCOUNTER — Ambulatory Visit: Payer: Medicare PPO | Admitting: Physical Therapy

## 2022-12-13 ENCOUNTER — Ambulatory Visit: Payer: Medicare PPO | Admitting: Physical Therapy

## 2022-12-25 ENCOUNTER — Other Ambulatory Visit: Payer: Self-pay | Admitting: Anesthesiology

## 2022-12-25 MED ORDER — TOPIRAMATE 50 MG PO TABS: 50.0000 mg | ORAL_TABLET | Freq: Two times a day (BID) | ORAL | 1 refills | Status: DC

## 2023-01-14 DIAGNOSIS — H53022 Refractive amblyopia, left eye: Secondary | ICD-10-CM | POA: Diagnosis not present

## 2023-01-14 DIAGNOSIS — H43823 Vitreomacular adhesion, bilateral: Secondary | ICD-10-CM | POA: Diagnosis not present

## 2023-01-14 DIAGNOSIS — H5712 Ocular pain, left eye: Secondary | ICD-10-CM | POA: Diagnosis not present

## 2023-01-14 DIAGNOSIS — H43811 Vitreous degeneration, right eye: Secondary | ICD-10-CM | POA: Diagnosis not present

## 2023-01-14 DIAGNOSIS — E113393 Type 2 diabetes mellitus with moderate nonproliferative diabetic retinopathy without macular edema, bilateral: Secondary | ICD-10-CM | POA: Diagnosis not present

## 2023-01-14 DIAGNOSIS — Z961 Presence of intraocular lens: Secondary | ICD-10-CM | POA: Diagnosis not present

## 2023-01-14 DIAGNOSIS — I679 Cerebrovascular disease, unspecified: Secondary | ICD-10-CM | POA: Diagnosis not present

## 2023-03-04 ENCOUNTER — Ambulatory Visit: Payer: No Typology Code available for payment source | Admitting: Adult Health

## 2023-03-04 ENCOUNTER — Encounter: Payer: Self-pay | Admitting: Adult Health

## 2023-03-04 VITALS — BP 137/86 | HR 82 | Ht 68.0 in | Wt 195.0 lb

## 2023-03-04 DIAGNOSIS — R404 Transient alteration of awareness: Secondary | ICD-10-CM | POA: Diagnosis not present

## 2023-03-04 DIAGNOSIS — R519 Headache, unspecified: Secondary | ICD-10-CM

## 2023-03-04 DIAGNOSIS — G8191 Hemiplegia, unspecified affecting right dominant side: Secondary | ICD-10-CM

## 2023-03-04 DIAGNOSIS — Z8673 Personal history of transient ischemic attack (TIA), and cerebral infarction without residual deficits: Secondary | ICD-10-CM | POA: Diagnosis not present

## 2023-03-04 DIAGNOSIS — R269 Unspecified abnormalities of gait and mobility: Secondary | ICD-10-CM | POA: Diagnosis not present

## 2023-03-04 MED ORDER — TOPIRAMATE 100 MG PO TABS: 100.0000 mg | ORAL_TABLET | Freq: Two times a day (BID) | ORAL | 5 refills | Status: DC

## 2023-03-04 NOTE — Progress Notes (Signed)
[facility] [address] [place]. [place] [address] [phone]       OFFICE FOLLOW-UP NOTE  [name] Date of Birth:  Aug 24, 1950 Medical Record Number:  [id number]   Primary neurologist: Dr. Pearlean Brownie Reason for visit: hx of stroke, seizure-like episodes  Chief Complaint  Patient presents with   Follow-up    Patient in room # with his wife.  Patient states he has falling a few time lately and had some episodes but not an full seizure. Patient states he been having headaches and trouble sleeping.   HISTORY:  Jeff [name] is a 72 y.o. male with hx of left cerebellum DWI negative stroke on 08/09/2018 due to left VA occlusion with residual imbalance/gait impairment and right-sided ataxia, TIA 12/2020, transient AMS 08/2019 possibly in setting of hypoglycemia and recurrent episodes of "zoning out" in spring 2023, and left-sided headaches. Recurrent transient AMS 10/2022 in setting of hypoglycemia with repeat EEG negative and again in 11/2022 (see below)    HPI:   Update 03/04/2023 JM: Returns today accompanied by his wife.  Previously seen by Dr. Teresa Coombs after episode of transient alteration of awareness in setting of hypoglycemia, repeat EEG negative.  Hospitalized 6/18 with recurrent transient AMS and unresponsiveness while working with PT (mouth open, eyes open, unresponsive but did blink to loud noise), noted glucose 71, patient given candy and D10 and mentation recovered, during transport to ED patient found unresponsive and suspected seizure lasting 2 minutes with left-sided gaze preference, administered Ativan and several rounds of D50. MRI brain negative for acute abnormality.  Recurrent event in ED with glucose 51. Noted recently starting on Ozempic with altered appetite and continued use of insulin.  Repeat EEG unremarkable, evaluated by neurology and felt recurrent events most likely in setting of hypoglycemia.  Reports additional episodes since then, most recently on  9/22. Wife has noticed episodes are occurring with glucose levels now in the 90s. They have set his glucose meter to alert with levels lower than 100. Will return back to baseline after improvement of glucose levels.  Routinely followed by endocrinology, prior visit 8/30, remains on insulin, wife reports they recommended holding Jardiance and lisinopril, has f/u in December. Glucose levels have been ranging 250s-300s on average.   He also c/o occasional headaches, at times can be once a week and other weeks can occur 3-4x, reports continued use of topiramate 50 mg twice daily.   He also reports continued gait difficulty especially with use of stairs, feels this has worsened over the past couple of months. Wife notes patient dragging right leg.  He has not noticed any weakness, denies lower back or hip pain. Has had a couple falls but thankfully without significant injury. Upon further questioning, reports right hand feeling asleep over the past couple of months, denies weakness. Completed 8 sessions of PT but remainder of PT visits cancelled after hospitalization in June. He is interested in restarting.      History provided for reference purposes only Update 10/18/2022 Dr. Teresa Coombs: This is a 72 year old gentleman past medical history of hypertension, hyperlipidemia, diabetes mellitus, TIA, previous stroke who is presenting after being admitted to the hospital for an episode of altered mental status/unresponsiveness.  Patient reports being at home, sitting on recliner and his glucose monitor warned him for low blood sugar.  Wife gave him two sips of soda and then he became unresponsive.  Wife reports eyes were open, tongue rolled back, initially unresponsive to touch and he started flailing.  EMS was called, per chart review when EMS arrived he was still hypoglycemic and he received dextrose.  He was taken to the hospital.  His initial workup including head CT was negative.  Wife reports that he did have  episodes similar but not this severe and those episodes were not related to low blood sugar.  He had an EEG a year ago which was negative for any epileptic epileptiform discharge but show mild diffuse slowing.  Since discharge from the hospital he is back to his normal self, no other complaints    Update 08/29/2022 JM: Patient returns for 42-month follow-up accompanied by his wife.  Headaches currently well-controlled on topiramate 50 mg twice daily, notes improvement since prior visit. Unable to say how many per month but per wife, he rarely complains of headaches. Will take Tylenol as needed with improvement.  He has not had any additional transient episodes of zoning out Does report episode of upper and lower extremity tremor back in February, noted significantly elevated glucose levels in the 400s, administered insulin and quickly drank 3 bottles of water, glucose levels dropped rapidly down to the 50s and then symptoms started which subsided quickly.  Did not lose consciousness or had altered awareness.  No other associated symptoms.  Has not had any additional symptoms since that time. Denies any further episodes of rapid changes of glucose levels or hypoglycemia.  Does complain of gait difficulty which is chronic, denies any worsening, does have bilateral lower extremity neuropathy which has been nonprogressive, had a near fall 2 weeks ago, apparently was trying to kneel at church and went down too quickly and landed hard on his knee, he did not fall over or hit his head.  Denies any other falls.  Denies any new stroke/TIA symptoms.  Compliant on aspirin and Crestor.  Blood pressure well-controlled.  [facility]  Update 03/13/2022 JM: Patient returns for follow-up visit.  Accompanied by his wife.  Headaches: Reports some improvement of headaches not as severe or frequent but still present.  Currently prescribed topiramate 25 mg twice daily, per wife does not take consistently,  sometimes will take just as needed. He is willing to take twice daily as prescribed  Transient episodes: He has not had any additional episodes since prior visit.  At prior visit, reported 2 episodes in March and May of "zoning out", did not lose consciousness, able to hear people around him, able to follow commands but unable to speak.  Becomes emotional during event.  Last 3 to 4 minutes.  Had fatigue after.  Completed EEG which was negative.  Repeat MRI brain negative for acute findings or changes compared to prior imaging 1 year ago.  Stable from stroke standpoint.  No new stroke/TIA symptoms.  Compliant on aspirin and Crestor.  Blood pressure well controlled.  [facility]. Has been working on diet, has being losing weight intentionally, cut down on red meats, eating more chicken, salad, seafood, veggies and fruits.  Is scheduled to see PCP at the [place] in December.  Update 11/30/2021 JM: Patient returns for follow-up visit after prior visit just about 6 months ago.  Accompanied by his wife.   Prior concerns of left-sided orbital headache have persisted, inflammatory markers negative. Seen by ophthalmologist Dr. Ashley Royalty who did not feel headache related to his retinopathy or any other eye condition. Currently, occurring 2-3x per week, previously using Tylenol with resolution but is unable to continue to use due to altering results of Dexcom  meter.  Headaches will typically resolve after laying down and taking a nap.  Occasional photophobia, denies phonophobia or N/V.  Denies any prior history of headaches or migraines or any family history.  Also mentions 2 episodes in March and May of "zoning out", does not lose conscousness, able to hear people around him, able to follow commands but unable to speak. Does report sensation of dizziness and hazy vision prior to episodes. Wife notes he becomes emotional during episode which he is unable to control. Lasts approx 3 to 4 minutes. Reports  fatigue after episode. Does mention episode in May, noted hypoglycemic but unable to recall level.  Blood pressure not checked during these times.  No prior diagnosis of seizures or family history of seizures.  Has had prior EEG in 03/2020 which was normal after transient disorientation and speech difficulty in setting of hypoglycemia with resolution of symptoms after correction.  Compliant on aspirin and Crestor, denies side effects.  Blood pressure today 111/68.  [facility] with [facility] work which has been satisfactory (unable to view via epic).  No further concerns at this time   Update 06/07/2020 JM: Mr. Abbot is here today for a stroke follow-up accompanied by his wife.  Denies new or reoccurring stroke/TIA symptoms. Does report left orbital headache 2-3x per day present past 2 months - quick sharp pain - only lasts for a few seconds. Will have occasional dull headache in same area relieved by Tylenol. No visual changes.  Denies photophobia, phonophobia or N/V. Does follow with Dr. Dione Booze and Dr. Jerolyn Center (retina) - has f/u with Dr. Ashley Royalty 1/27.  No prior history of headaches or migraines.  Recently established care with endocrinologist Dr. Talmage Nap with improvement of glucose levels.  Prior A1c 7.8 (02/2021) - believes more recent level completed but unknown level -unable to view via epic. Remains on Aspirin and Crestor without side effects. More recent lipid panel 01/2021 LDL 41. BP today 128/82.  Does not routinely monitor at home.  No further concerns at this time   UPDATE 03/07/2021 PS: Mr. Abdala is a pleasant 72 year old Caucasian male seen today for initial office follow-up visit following hospital consultation for TIA.  History is obtained from the patient and and wife and have personally reviewed electronic medical records as well as pertinent available imaging films in PACS. Mr. Dabdoub has past medical history of diabetes, pretension, hyperlipidemia, obstructive sleep apnea and prior  stroke with some residual right-sided weakness and gait difficulty who presented on 01/01/2021 with sudden onset of word finding difficulties, slurred speech and fall due to bilateral right greater than left leg weakness.  He was fine when he went to bed the night prior but when he woke up he could not remember the exact time but noticed that his legs were weak and he had a fall while trying to get up and when he tried to speak his speech was not right.  His speech was slurred and he had trouble getting the words out.  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  He had a prior history of a stroke in March 2020 which was a punctate left medullary infarct for which she was treated with tPA with modest improvement but  with some residual right-sided deficits and gait imbalance.  MRI scan of the brain showed no acute abnormality and showed chronic lacunar infarcts in the right basal ganglia carotid ultrasound showed only 1-39% bilateral carotid stenosis.  2D echo showed ejection fraction of 60 to 65% without definite clot.  LDL cholesterol 41 mg percent and hemoglobin A1c was elevated 8.6.  Patient also had diagnosis of a catheter angiogram on 01/03/2021 which showed only moderate 50% bilateral cavernous carotid stenosis and 50 to 60% proximal right PCA stenosis.  Left vertebral artery was occluded in the distal segment.  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  There was felt to be high-grade stenosis of the superior division of the left middle cerebral artery but this was difficult to appreciate on the corresponding CT angio and MR angiogram images and states it was felt that patient should be treated with maximal medical therapy  and was discharged on aspirin and Brilinta for 3 months..   He states he is doing well is tolerating both medications without significant bruising or bleeding.  Blood pressures under good control today it is 109/72.  Is tolerating Crestor well without muscle aches and pains.  His sugars however fluctuate quite a bit.  He has an appointment to see endocrinologist to discuss better diabetes control.  Patient does have history of sleep apnea and snores a lot.  However he has tried 4 different CPAP mask and has not been able to tolerate them.  He sees a neurologist in the [place] [facility] and plans to discuss alternative treatment options at next visit.  He is currently doing outpatient physical occupational therapy.  He still has mild paresthesias in his feet from diabetic neuropathy.  He is also getting some outpatient occupational therapy for right frozen shoulder at the [place].  He has no new complaints.    ROS:   14 system review of systems is positive for those listed in HPI and all other systems negative  PMH:  Past Medical History:  Diagnosis Date   ADHD (attention deficit hyperactivity disorder)    Diabetes mellitus    Hyperlipidemia    Hypertension    Renal disorder    Seizures (HCC)    Stroke (HCC)     Social History:  Social History   Socioeconomic History   Marital status: Married    Spouse name: [name]   Number of children: 2   Years of education: Not on file   Highest education level: Not on file  Occupational History   Occupation: Archivist  Tobacco Use   Smoking status: Never   Smokeless tobacco: Never  Vaping Use   Vaping status: Never Used  Substance and Sexual Activity   Alcohol use: No    Alcohol/week: 0.0 standard drinks of alcohol   Drug use: No   Sexual activity: Yes  Other Topics Concern   Not on file  Social History Narrative   Lives with wife   Social Determinants of Health   Financial Resource Strain: Not on file  Food Insecurity: No  Food Insecurity (11/20/2022)   Hunger Vital Sign    Worried About Running Out of Food in the Last Year: Never true    Ran Out of Food in the Last Year: Never true  Transportation Needs: No Transportation Needs (11/20/2022)   PRAPARE - Administrator, Civil Service (Medical): No    Lack of Transportation (Non-Medical): No  Physical Activity: Not on file  Stress: Not on  file  Social Connections: Not on file  Intimate Partner Violence: Not At Risk (11/20/2022)   Humiliation, Afraid, Rape, and Kick questionnaire    Fear of Current or Ex-Partner: No    Emotionally Abused: No    Physically Abused: No    Sexually Abused: No    Medications:   Current Outpatient Medications on File Prior to Visit  Medication Sig Dispense Refill   acetaminophen (TYLENOL) 500 MG tablet Take 500-1,000 mg by mouth every 6 (six) hours as needed for headache.     aspirin EC 81 MG EC tablet Take 1 tablet (81 mg total) by mouth daily. Swallow whole. 30 tablet 11   Cholecalciferol (VITAMIN D) 50 MCG (2000 UT) tablet Take 2,000 Units by mouth daily.     Glucagon 1 MG/0.2ML SOAJ Inject 1 mg into the skin as needed (low blood sugar). 0.2 mL 0   glucose blood (ONETOUCH VERIO) test strip 1 each by Other route 4 (four) times daily -  before meals and at bedtime. dexcom 6     insulin aspart (NOVOLOG FLEXPEN) 100 UNIT/ML FlexPen Inject 0-20 Units into the skin See admin instructions. Sliding scale per endocrinologist. (3 times with each meal) Less than 100 = 0  100 -150 = 5 units  150-200 = 8 units   200-250 =12 units 15 mL 11   insulin glargine (LANTUS) 100 UNIT/ML injection Inject 0.1 mLs (10 Units total) into the skin 2 (two) times daily.     Insulin Syringe-Needle U-100 (B-D INS SYR ULTRAFINE 1CC/31G) 31G X 5/16" 1 ML MISC Used to inject insulin twice daily. 90 each 2   levothyroxine (SYNTHROID) 50 MCG tablet Take 50 mcg by mouth daily before breakfast.     Magnesium 250 MG TABS Take 250 mg by mouth 2 (two) times  daily.     Multiple Vitamins-Minerals (ICAPS AREDS 2 PO) Take 1 capsule by mouth in the morning and at bedtime.     pantoprazole (PROTONIX) 40 MG tablet Take 1 tablet (40 mg total) by mouth daily.     rosuvastatin (CRESTOR) 40 MG tablet TAKE 1 TABLET BY MOUTH DAILY (Patient taking differently: Take 40 mg by mouth every evening.) 90 tablet 0   sodium bicarbonate 650 MG tablet Take 650 mg by mouth 2 (two) times daily.     tamsulosin (FLOMAX) 0.4 MG CAPS capsule Take 1 capsule (0.4 mg total) by mouth daily. 30 capsule 0   topiramate (TOPAMAX) 50 MG tablet Take 1 tablet (50 mg total) by mouth 2 (two) times daily. 180 tablet 1   empagliflozin (JARDIANCE) 25 MG TABS tablet Take 12.5 mg by mouth daily. (Patient not taking: Reported on 03/04/2023)     lisinopril (ZESTRIL) 2.5 MG tablet Take 1 tablet (2.5 mg total) by mouth daily. (Patient not taking: Reported on 03/04/2023)     No current facility-administered medications on file prior to visit.    Allergies:   Allergies  Allergen Reactions   Flexeril [Cyclobenzaprine Hcl] Other (See Comments)    Causes him to pass out and lowers his BP   Lidocaine Swelling    throat   Novocain [Procaine Hcl] Swelling    Physical Exam Today's Vitals   03/04/23 1359  BP: 137/86  Pulse: 82  Weight: 195 lb (88.5 kg)  Height: 5\' 8"  (1.727 m)   Body mass index is 29.65 kg/m.    General: well developed, well nourished very pleasant elderly Caucasian male, seated, in no evident distress Head: head normocephalic and atraumatic.  Neck: supple with no carotid or supraclavicular bruits Cardiovascular: regular rate and rhythm, no murmurs Musculoskeletal: no deformity Skin:  no rash/petichiae Vascular:  Normal pulses all extremities  Neurologic Exam Mental Status: Awake and fully alert.  Fluent speech and language.  Oriented to place and time. Recent and remote memory intact. Attention span, concentration and fund of knowledge appropriate. Mood and affect  appropriate.  Cranial Nerves: Pupils equal, briskly reactive to light. Extraocular movements full without nystagmus. Visual fields full to confrontation. Hearing intact. Facial sensation intact. Tongue, palate moves normally and symmetrically. Right lower facial weakness - new since prior visit Motor: Normal strength, bulk and tone left upper and lower extremity. RUE 4/5 deltoid (chronic from prior shoulder surgery), 4/5 elbow extension and flexion, decreased grip strength and hand dexterity - new since prior visit. RLE 4/5 HF, slight KE, KF and ADF weakness although question some giveaway weakness - new since prior visit Sensory.: intact to touch ,pinprick but slightly impaired.position and vibratory sensation over both feet bilaterally..   Coordination: Rapid alternating movements decreased right hand. Finger-to-nose and heel-to-shin mild right sided ataxia - chronic Gait and Station: Arises from chair without difficulty. Stance is normal.  Gait abnormality with decreased stride length and step height of RLE, can lift higher when walking but challenging, imbalance greater with turns.  Tandem walk and heel toe not attempted Reflexes: 1+ and symmetric. Toes downgoing.        ASSESSMENT/PLAN: CAMRAN TIETJEN is a 72 y.o. year old male with left cerebellum DWI negative infarct s/p TPA on 08/09/2018 secondary to left VA occlusion versus arthrosclerosis given risk factors and ICA siphon atherosclerosis and residual right-sided ataxia and gait impairment, and left hemispheric TIA 12/2020.  Vascular risk factors include HTN, HLD, intracranial stenosis and DM. Onset of left orbital headaches starting around 04/2021 and recurrent transient altered awareness in setting of hypoglycemia 08/2021, 10/2021, 10/2022 and 11/2022. Today, evidence of right sided weakness and subjective right hand numbness with worsening gait and dragging of RLE and worsening headaches over the past 2 months (patient unaware of  weakness)    Neurological deficit Hx of stroke and TIA New symptoms present over the past 2 months per patient with worsening gait, dragging of right leg and right hand numbness as well as worsening headaches, unknown onset of right sided weakness but suspect similar timeframe  Concern of possible stroke vs recrudescence of prior stroke symptoms with hx of left cerebellar stroke Recommend MRI brain to rule out new stroke, prior MRI brain in June without significant concerning findings but symptom onset around July Referral placed to neurorehab PT/OT, may benefit from AD for fall prevention but will defer to PT Continuation of aspirin and Crestor for secondary stroke prevention managed/prescribed by PCP, if new stroke found will recommend stopping aspirin and switching to Plavix for further stroke prevention measures  Continue to follow with PCP and endocrinology for aggressive stroke risk factor management  Orbital headaches Headaches worsened past 2 months Increase topiramate to 100 mg twice daily Continue Tylenol as needed Negative inflammatory markers  Transient episodes Unknown etiology with recurrent EEGs negative, always in setting of hypoglycemia Multiple recurrent episodes in setting of hypoglycemia in the 70s although more recent events with glucose levels in the 90s, will resolve after improvement of glucose levels.  If these are any type of focal seizure, advised increasing topiramate dosage (as above) for headache prevention can also help prevent seizures Advise close follow-up with endocrinology for adequate glucose control EEG 727/2023,  11/08/2022 and 11/21/2022 no definite epileptiform activity noted  MR brain 11/2022 no acute abnormality  Carotid artery stenosis Left vertebral artery occlusion, chronic  Will plan on repeat CUS in 2026 for surveillance monitoring Carotid ultrasound 09/2022 bilateral ICA 1 to 39% stenosis, R VA antegrade flow, L VA high resistant flow Carotid  ultrasound 01/2021 bilateral ICA 1 to 39% stenosis, R VA antegrade flow, L VA high resistant flow     Follow-up in 6 months or call earlier if needed    CC:  Anson Fret, MD   I spent 45 minutes of face-to-face and non-face-to-face time with patient and wife.  This included previsit chart review, lab review, study review, electronic health record documentation, patient and wife education and discussion regarding above diagnoses and answered all other questions to patient and wife satisfaction  Ihor Austin, AGNP-BC  [facility] [address] [place], [place] [address]  Phone [phone] Fax [phone] Note: This document was prepared with digital dictation and possible smart phrase technology. Any transcriptional errors that result from this process are unintentional.

## 2023-03-04 NOTE — Patient Instructions (Addendum)
Your Plan:  Recommend increasing topamax to 100mg  twice daily for headaches, this can also help with possible seizures although higher suspicion in setting of low glucose levels   Recommend completing an MRI brain to rule out new stroke in setting of right sided weakness  Referral will be placed to physical and occupational therapy - you can call them mid week to schedule  Continue to follow with endocrinology for diabetic management       Follow up in 6 months or call earlier if needed      Thank you for coming to see [place] at [facility]. I hope we have been able to provide you high quality care today.  You may receive a patient satisfaction survey over the next few weeks. We would appreciate your feedback and comments so that we may continue to improve ourselves and the health of our patients.

## 2023-03-05 ENCOUNTER — Encounter (INDEPENDENT_AMBULATORY_CARE_PROVIDER_SITE_OTHER): Payer: Medicare PPO | Admitting: Ophthalmology

## 2023-03-05 DIAGNOSIS — H35033 Hypertensive retinopathy, bilateral: Secondary | ICD-10-CM | POA: Diagnosis not present

## 2023-03-05 DIAGNOSIS — E113391 Type 2 diabetes mellitus with moderate nonproliferative diabetic retinopathy without macular edema, right eye: Secondary | ICD-10-CM

## 2023-03-05 DIAGNOSIS — Z794 Long term (current) use of insulin: Secondary | ICD-10-CM | POA: Diagnosis not present

## 2023-03-05 DIAGNOSIS — Z7985 Long-term (current) use of injectable non-insulin antidiabetic drugs: Secondary | ICD-10-CM | POA: Diagnosis not present

## 2023-03-05 DIAGNOSIS — I10 Essential (primary) hypertension: Secondary | ICD-10-CM

## 2023-03-05 DIAGNOSIS — E113312 Type 2 diabetes mellitus with moderate nonproliferative diabetic retinopathy with macular edema, left eye: Secondary | ICD-10-CM | POA: Diagnosis not present

## 2023-03-05 DIAGNOSIS — H43813 Vitreous degeneration, bilateral: Secondary | ICD-10-CM

## 2023-03-07 ENCOUNTER — Telehealth: Payer: Self-pay | Admitting: Adult Health

## 2023-03-07 NOTE — Telephone Encounter (Signed)
Jeff [name]: [id number] exp. 03/07/23-05/06/23 sent to GI [phone]

## 2023-03-21 ENCOUNTER — Ambulatory Visit: Payer: Medicare PPO | Admitting: Occupational Therapy

## 2023-03-21 ENCOUNTER — Encounter: Payer: Self-pay | Admitting: Occupational Therapy

## 2023-03-21 ENCOUNTER — Ambulatory Visit: Payer: Medicare PPO | Attending: Otolaryngology | Admitting: Physical Therapy

## 2023-03-21 VITALS — BP 120/83 | HR 92

## 2023-03-21 DIAGNOSIS — R293 Abnormal posture: Secondary | ICD-10-CM | POA: Insufficient documentation

## 2023-03-21 DIAGNOSIS — R2689 Other abnormalities of gait and mobility: Secondary | ICD-10-CM | POA: Insufficient documentation

## 2023-03-21 DIAGNOSIS — G8191 Hemiplegia, unspecified affecting right dominant side: Secondary | ICD-10-CM | POA: Insufficient documentation

## 2023-03-21 DIAGNOSIS — R2681 Unsteadiness on feet: Secondary | ICD-10-CM | POA: Insufficient documentation

## 2023-03-21 DIAGNOSIS — I69351 Hemiplegia and hemiparesis following cerebral infarction affecting right dominant side: Secondary | ICD-10-CM | POA: Diagnosis not present

## 2023-03-21 DIAGNOSIS — R278 Other lack of coordination: Secondary | ICD-10-CM | POA: Diagnosis not present

## 2023-03-21 DIAGNOSIS — M6281 Muscle weakness (generalized): Secondary | ICD-10-CM | POA: Diagnosis not present

## 2023-03-21 NOTE — Therapy (Addendum)
OUTPATIENT PHYSICAL THERAPY NEURO EVALUATION   Patient Name: Jeff [name] MRN: [id number] DOB:01-01-1951, 72 y.o., male Today's Date: 03/22/2023   PCP: Anson Fret, MD  REFERRING PROVIDER: Ihor Austin, NP  END OF SESSION:  PT End of Session - 03/21/23 1531     Visit Number 1    Number of Visits 13   plus eval   Date for PT Re-Evaluation 05/17/23   to allow for scheduling delays   Authorization Type [employer]ICARE CHOICE PPO    PT Start Time 1536   received from OT   PT Stop Time 1620    PT Time Calculation (min) 44 min    Equipment Utilized During Treatment Gait belt    Activity Tolerance Patient tolerated treatment well    Behavior During Therapy WFL for tasks assessed/performed             Past Medical History:  Diagnosis Date   ADHD (attention deficit hyperactivity disorder)    Diabetes mellitus    Hyperlipidemia    Hypertension    Renal disorder    Seizures (HCC)    Stroke [facility])    Past Surgical History:  Procedure Laterality Date   IR ANGIO INTRA EXTRACRAN SEL COM CAROTID INNOMINATE BILAT MOD SED  01/03/2021   IR ANGIO VERTEBRAL SEL SUBCLAVIAN INNOMINATE UNI L MOD SED  01/03/2021   IR ANGIO VERTEBRAL SEL VERTEBRAL UNI R MOD SED  01/03/2021   IR RADIOLOGIST EVAL & MGMT  01/13/2021   IR US GUIDE VASC ACCESS RIGHT  01/03/2021   NECK SURGERY     SPINE SURGERY     Cervical spine x 2; Vear Clock; Critzer.   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 (HCC) 08/12/2018   Stroke (HCC) 08/08/2018   Diabetes mellitus (HCC) 07/25/2015   Neck injury 06/22/2015   Hearing loss in right ear 10/14/2011   Agent orange exposure 10/14/2011   ADD (attention deficit disorder) 10/14/2011   Hyperlipidemia 10/14/2011   BMI 28.0-28.9,adult 10/14/2011    ONSET DATE: 03/04/2023 (referral date)  REFERRING DIAG: G81.91 (ICD-10-CM) - Right hemiparesis (HCC)  THERAPY DIAG:  Other abnormalities of gait and mobility - Plan: PT plan of care cert/re-cert  Unsteadiness on feet - Plan: PT plan of care cert/re-cert  Muscle weakness (generalized) - Plan: PT plan of care cert/re-cert  Abnormal posture - Plan: PT plan of care cert/re-cert  Hemiplegia and hemiparesis following cerebral infarction affecting right dominant side (HCC) - Plan: PT plan of care cert/re-cert  Rationale for Evaluation and Treatment: Rehabilitation  SUBJECTIVE:  SUBJECTIVE STATEMENT: Pt familiar to this clinic and several therapists. Was making good progress in earlier therapy but had to stop due to recurrent seizures. Pt thinks he may have had another stroke since then and will have imaging in November to assess. Pt's most recent seizure activity was about 3 weeks ago at church- 1 in morning, 1 at night. His blood sugar has been running high for several months. Doctors want sugars above 100. Reports new-onset migraine-like headaches that will keep him in bed all day, they can last for days. New onset R grip strength noticed at last neurologist appointment.   Pt accompanied by: family member: wife, [name]  PERTINENT HISTORY: hx of left cerebellum stroke (March 2020) with residual imbalance/gait impairment and right-sided ataxia, TIA (July 2022), transient AMS possibly in setting of  hypoglycemia, left-sided headaches, hypertension, hyperlipidemia, diabetes mellitus, h/o cervical fusion C5-7 (2012), h/o neck injury 2016, stage 3 CKD, and intracranial stenosis   PAIN:  Are you having pain? No  PRECAUTIONS: Fall  RED FLAGS: Bowel or bladder incontinence: No   WEIGHT BEARING RESTRICTIONS: No  FALLS: Has patient fallen in last 6 months? Yes. Number of falls "Too many to remember": "in the last 2 weeks I fell once", has not gone to hospital for any of these falls, has hit head with one- "fell out of bed and hit head on nightstand" needed help from wife to get away from the bed but stood on own. Had fall in bathroom. Not typically falling when walking, but getting up from sitting. No reports of dizziness or lightheadedness.   LIVING ENVIRONMENT: Lives with: lives with their spouse Lives in: House/apartment Stairs: Yes: External: 1 to porch, 1 to foyer steps; none can use ramp at back of house Has following equipment at home:  "I've got it all"  PLOF: Needs assistance with ADLs- some dressing like little buttons on shirt sleeves or cufflinks, has not driven in over 2 years  PATIENT GOALS: "walk good", "get back to as normal as I can"  OBJECTIVE:  Note: Objective measures were completed at Evaluation unless otherwise noted.  DIAGNOSTIC FINDINGS:   MRI HEAD WITHOUT AND WITH CONTRAST (11/21/2022) IMPRESSION: 1. No acute intracranial abnormality or mass. No specific findings to explain seizures. 2. Unchanged moderate chronic small-vessel disease and central pattern of volume loss.  04/08/2023  5:20 PM repeat MRI WO Contrast  COGNITION: Overall cognitive status: Within functional limits for tasks assessed   SENSATION: R hand "has gone to sleep", feels like this all the time, pt feels that it is colder than the L , impaired sensation on the top of both feet  COORDINATION: Heel-to-shin WFL  EDEMA:  "Feet will swell every once in a while"   LOWER EXTREMITY ROM:      Active  Right Eval Left Eval  Hip flexion [facility]  Hip extension    Hip abduction    Hip adduction    Hip internal rotation    Hip external rotation    Knee flexion [facility]  Knee extension limited   Ankle dorsiflexion [facility]  Ankle plantarflexion    Ankle inversion    Ankle eversion     (Blank rows = not tested)  LOWER EXTREMITY MMT:    MMT Right Eval Left Eval  Hip flexion 3 4-  Hip extension    Hip abduction    Hip adduction    Hip internal rotation    Hip external rotation    Knee flexion 3 5  Knee extension 3 5  Ankle dorsiflexion 3+ 5  Ankle plantarflexion    Ankle inversion    Ankle eversion    (Blank rows = not tested)  BED MOBILITY:  Pt reports no difficulty   STAIRS: Level of Assistance: CGA and Min A Stair Negotiation Technique: Step to Pattern with Bilateral Rails Number of Stairs: 4  Height of Stairs: standard  Comments: pt w/ increased difficulty descending requiring Min A for balance "I'm not sure I could have done that without your help"  GAIT: Gait pattern: step through pattern, decreased arm swing- Right, decreased arm swing- Left, decreased step length- Right, decreased step length- Left, decreased hip/knee flexion- Right, decreased ankle dorsiflexion- Right, decreased ankle dorsiflexion- Left, trunk flexed, poor foot clearance- Right, and poor foot clearance- Left Distance walked: various clinical distances Assistive device utilized: None Level of assistance: SBA Comments: consider assessing w/ ADs  FUNCTIONAL TESTS:    [facility] PT Assessment - 03/21/23 1605       Ambulation/Gait   Gait velocity =32.8 ft over 17.03 seconds= 1.93 ft/sec   no AD, SBA>CGA     Standardized Balance Assessment   Standardized Balance Assessment Timed Up and Go Test      Timed Up and Go Test   Normal TUG (seconds) 12.92      Functional Gait  Assessment   Gait assessed  Yes    Gait Level Surface Walks 20 ft, slow speed, abnormal gait pattern, evidence  for imbalance or deviates 10-15 in outside of the 12 in walkway width. Requires more than 7 sec to ambulate 20 ft.   8.6 seconds   Change in Gait Speed Makes only minor adjustments to walking speed, or accomplishes a change in speed with significant gait deviations, deviates 10-15 in outside the 12 in walkway width, or changes speed but loses balance but is able to recover and continue walking.    Gait with Horizontal Head Turns Performs head turns with moderate changes in gait velocity, slows down, deviates 10-15 in outside 12 in walkway width but recovers, can continue to walk.   pt reports dizziness with activity   Gait with Vertical Head Turns Performs task with severe disruption of gait (eg, staggers 15 in outside 12 in walkway width, loses balance, stops, reaches for wall).   pt reports dizziness w/ this activity, 1 LOB requiring Mod A to recover   Gait and Pivot Turn Cannot turn safely, requires assistance to turn and stop.   pt loses balance, Mod A to recover   Step Over Obstacle Is able to step over one shoe box (4.5 in total height) but must slow down and adjust steps to clear box safely. May require verbal cueing.    Gait with Narrow Base of Support Ambulates less than 4 steps heel to toe or cannot perform without assistance.    Gait with Eyes Closed Walks 20 ft, slow speed, abnormal gait pattern, evidence for imbalance, deviates 10-15 in outside 12 in walkway width. Requires more than 9 sec to ambulate 20 ft.    Ambulating Backwards Cannot walk 20 ft without assistance, severe gait deviations or imbalance, deviates greater than 15 in outside 12 in walkway width or will not attempt task.    Steps Two feet to a stair, must use rail.    Total Score 6    FGA comment: high fall risk               VITALS:  Vitals:   03/21/23 1600  BP: 120/83  Pulse: 92     TODAY'S TREATMENT:                                                                                                                               None, Eval Only  Blood Sugar: 330 at 3:41pm Blood Sugar: 332 at 3:58 pm   PATIENT EDUCATION: Education details: POC, outcome measures Person educated: Patient and Spouse Education method: Explanation Education comprehension: verbalized understanding and needs further education  HOME EXERCISE PROGRAM: To be initiated  GOALS: Goals reviewed with patient? No  SHORT TERM GOALS: Target date: 04/11/2023    Pt will improve FGA to at least 10/30 (MCID 4, Fall risk <22) for improved dynamic balance and safety.   Baseline: 6/30 (10/17) Goal status: INITIAL  2.  Pt will be independent with initial HEP for improved strength, balance, transfers and gait.  Baseline:  Goal status: INITIAL  3.  Pt will improve gait velocity to at least 2.50 ft/sec for improved gait efficiency and safety in the home Baseline: 1.93 ft/sec (03/21/23) Goal status: INITIAL  4.  Perform mCTSIB and set goal as appropriate for standing balance Baseline:  Goal status: INITIAL   LONG TERM GOALS: Target date: 05/02/2023    Pt will improve FGA to at least 14/30 (MCID 4, Fall risk <22) for improved dynamic balance and safety.   Baseline: 6/30 (03/21/23) Goal status: INITIAL  2.  Pt will be independent with final HEP for improved strength, balance, transfers and gait.  Baseline:  Goal status: INITIAL  3.  Pt will improve gait velocity to at least 3.28 ft/sec (norm) for improved gait efficiency and safety in the community  Baseline: 1.93 ft/sec (03/21/23) Goal status: INITIAL  4.  Perform mCTSIB and set goal as appropriate for standing balance Baseline:  Goal status: INITIAL   ASSESSMENT:  CLINICAL IMPRESSION: Patient is a 72 year old male referred to Neuro OPPT for Right hemiparesis, .   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 The following deficits were present during the exam: decreased RLE strength, decreased R knee extension, impaired gait, impaired balance, dizziness, impaired sensation, and decreased awareness of impairments and safety. Based on the FGA score of 6/30, pt is an incr risk for falls. Pt would benefit from skilled PT to address these impairments and functional limitations to maximize functional mobility independence   OBJECTIVE IMPAIRMENTS: Abnormal gait, decreased activity tolerance, decreased balance, decreased endurance, decreased knowledge of use of DME, decreased mobility, difficulty walking, decreased ROM, decreased strength, decreased safety awareness, dizziness, hypomobility, impaired flexibility, and impaired sensation.   ACTIVITY LIMITATIONS: lifting, bending, squatting, stairs, transfers, bathing, dressing, and hygiene/grooming  PARTICIPATION LIMITATIONS: meal prep, cleaning, laundry,  driving, shopping, community activity, occupation, and yard work  PERSONAL FACTORS: Age, Time since onset of injury/illness/exacerbation, and 3+ comorbidities:    hx left cerebellum stroke, TIA (July 2022), transient AMS possibly in setting of hypoglycemia, left-sided headaches, hypertension, hyperlipidemia, diabetes mellitus, h/o cervical fusion C5-7 (2012), h/o neck injury 2016, stage 3 CKD, and intracranial stenosis are also affecting patient's functional outcome.   REHAB POTENTIAL: Fair original stroke was in 2020, potentially had a new stroke, balance and gait have declined since last seen at this clinic  CLINICAL DECISION MAKING: Evolving/moderate complexity  EVALUATION COMPLEXITY: Moderate  PLAN:   PT FREQUENCY: 2x/week  PT DURATION: 6 weeks  PLANNED INTERVENTIONS: 97164- PT Re-evaluation, 97110-Therapeutic exercises, 97530- Therapeutic  activity, 97112- Neuromuscular re-education, 97535- Self Care, 16109- Manual therapy, 97116- Gait training, [phone]- Canalith repositioning, 97014- Electrical stimulation (unattended), [phone]- Electrical stimulation (manual), Patient/Family education, Balance training, Stair training, Taping, Dry Needling, Joint mobilization, Joint manipulation, Spinal manipulation, Spinal mobilization, Vestibular training, DME instructions, Cryotherapy, and Moist heat  PLAN FOR NEXT SESSION: Add to HEP as appropriate, ask about triggers or signs for seizures, assess if RW improves gait, measure R knee extension?, vestibular system impairments?, mCTSIB, RLE strengthening, balance and gait training, very unsafe w/ most dynamic gait challenges: guard closely Hx of seizures, no blaze pods, ask about blood glucose (low)  Peter Congo, PT 03/22/2023, 8:37 AM

## 2023-03-21 NOTE — Therapy (Signed)
OUTPATIENT OCCUPATIONAL THERAPY NEURO EVALUATION  Patient Name: Jeff [name] MRN: [id number] DOB:September 17, 1950, 72 y.o., male Today's Date: 03/21/2023  PCP: Anson Fret, MD  REFERRING PROVIDER: Ihor Austin, NP  END OF SESSION:  OT End of Session - 03/21/23 1456     Visit Number 1    Number of Visits 13    Date for OT Re-Evaluation 05/03/23    Authorization Type [employer] - requires auth    Progress Note Due on Visit 10    OT Start Time 1455    OT Stop Time 1535    OT Time Calculation (min) 40 min    Activity Tolerance Patient tolerated treatment well    Behavior During Therapy WFL for tasks assessed/performed             Past Medical History:  Diagnosis Date   ADHD (attention deficit hyperactivity disorder)    Diabetes mellitus    Hyperlipidemia    Hypertension    Renal disorder    Seizures (HCC)    Stroke [facility])    Past Surgical History:  Procedure Laterality Date   IR ANGIO INTRA EXTRACRAN SEL COM CAROTID INNOMINATE BILAT MOD SED  01/03/2021   IR ANGIO VERTEBRAL SEL SUBCLAVIAN INNOMINATE UNI L MOD SED  01/03/2021   IR ANGIO VERTEBRAL SEL VERTEBRAL UNI R MOD SED  01/03/2021   IR RADIOLOGIST EVAL & MGMT  01/13/2021   IR US GUIDE VASC ACCESS RIGHT  01/03/2021   NECK SURGERY     SPINE SURGERY     Cervical spine x 2; Vear Clock; Critzer.   Patient Active Problem List   Diagnosis Date Noted   Benign essential hypertension 11/20/2022   Hypomagnesemia 11/20/2022   Falls 11/20/2022   Obstructive sleep apnea (adult) (pediatric) 11/20/2022   Syncope and collapse 11/20/2022   Uncontrolled type 2 diabetes mellitus with hypoglycemia, with long-term current use of insulin (HCC) 11/20/2022   Acute encephalopathy 11/20/2022   Hypoglycemia 11/20/2022   TIA (transient ischemic attack) 03/10/2020   CKD (chronic kidney disease) 08/28/2018   Retinopathy of both eyes 08/28/2018   Stroke due to embolism of left vertebral artery (HCC) 08/12/2018   Stroke (HCC) 08/08/2018    Diabetes mellitus (HCC) 07/25/2015   Neck injury 06/22/2015   Hearing loss in right ear 10/14/2011   Agent orange exposure 10/14/2011   ADD (attention deficit disorder) 10/14/2011   Hyperlipidemia 10/14/2011   BMI 28.0-28.9,adult 10/14/2011    ONSET DATE: 03/04/2023 (date of referral)  REFERRING DIAG: G81.91 (ICD-10-CM) - Right hemiparesis (HCC) R26.9 (ICD-10-CM) - Gait abnormality  THERAPY DIAG:  Muscle weakness (generalized)  Other lack of coordination  Hemiplegia and hemiparesis following cerebral infarction affecting right dominant side (HCC)  Rationale for Evaluation and Treatment: Rehabilitation  SUBJECTIVE:   SUBJECTIVE STATEMENT: Pt states he is most limited by the numbness in his R hand. He has adapted to using his LUE as his dominant hand. He states his blood sugar has been high lately and is working with his physicians to come to a resolution.   Pt accompanied by: significant other - [name]  PERTINENT HISTORY: PMH: hypertension, hyperlipidemia, diabetes mellitus, TIA, previous stroke (March 2020), h/o cervical fusion C5-7 (2012), h/o neck injury 2016, stage 3 CKD, seizures   Right sided weakness - decreased right hand dexterity, c/o numbness  PRECAUTIONS: Fall and Other: seizure  WEIGHT BEARING RESTRICTIONS: No  PAIN:  Are you having pain? No  FALLS: Has patient fallen in last 6 months? Yes. 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  HAND DOMINANCE: Right  ADLs: Overall ADLs: mod I Transfers/ambulation related to ADLs: UB Dressing: difficulty with buttons on sleeves and cufflinks LB Dressing: assistance with belt management to reach behind back  IADLs:  Community mobility: dependent Medication management: min A  Handwriting:  doesn't write a lot  MOBILITY STATUS: Independent  ACTIVITY TOLERANCE: Activity tolerance: Good to fair  FUNCTIONAL OUTCOME MEASURES: Quick Dash: 29.5 % disability with use of R hand   UPPER EXTREMITY ROM and MMT:    BUE - WFL  HAND FUNCTION: Grip strength: Right: 19.4 lbs; Left: 69.6 lbs  COORDINATION: To be tested  SENSATION: Reports severe paresthesias in R hand  EDEMA: none reported or observed  MUSCLE TONE: WFL  COGNITION: Overall cognitive status: Within functional limits for tasks assessed  PERCEPTION: Not tested  PRAXIS: Not tested  OBSERVATIONS: Pt appears well-kept. He ambulates without use of AD. No LOB.   TODAY'S TREATMENT:                                                                                                                               N/A This date  PATIENT EDUCATION: Education details: OT Role and POC Person educated: Patient and Spouse Education method: Explanation Education comprehension: verbalized understanding  HOME EXERCISE PROGRAM: N/A this date  GOALS:  SHORT TERM GOALS: Target date: 04/19/2023    Patient will demonstrate independence with initial RUE HEP. Baseline: Goal status: INITIAL  2.  Pt will report no more than moderate difficulty washing back with RUE using adaptive strategies or AD as needed.  Baseline: severe difficulty Goal status: INITIAL  3.  Pt will report RUE as primary hand for completion of at least 3 daily activities. Baseline: only uses R hand with TV remote Goal status: INITIAL  4.  Pt  will independently recall the 5 main sensory precautions (cold, heat, sharp, chemical, and heavy) as needed to prevent injury/harm secondary to impairments.   Baseline:  Goal status: INITIAL   LONG TERM GOALS: Target date: 05/03/2023  Patient will demonstrate updated RUE HEP with 25% verbal cues or less for proper execution. Baseline:  Goal status: INITIAL  2.  Pt will report use of RUE >50% of time vs non-dominant LUE. Baseline: less than 50% of the time Goal status: INITIAL  3.  Patient will demonstrate at least 30 lbs R grip strength as needed to open  jars and other containers. Baseline: 19.4 lbs Goal status: INITIAL  4.  Pt will be able to open a tight or new jar with no more than moderate difficulty reported.  Baseline: unable Goal status: INITIAL  ASSESSMENT:  CLINICAL IMPRESSION: Patient is a 72 y.o. male who was seen today for occupational therapy evaluation for Right hemiparesis. Hx includes hypertension, hyperlipidemia, diabetes mellitus, TIA, previous stroke (March 2020), h/o cervical fusion C5-7 (2012), h/o neck injury 2016, stage 3 CKD, seizures . Patient currently presents below baseline level of functioning demonstrating functional deficits and impairments as noted below. Pt would benefit from skilled OT services in the outpatient setting to work on impairments as noted below to help pt return to PLOF as able.    PERFORMANCE DEFICITS: in functional skills including ADLs, IADLs, coordination, sensation, strength, Fine motor control, and UE functional use.   IMPAIRMENTS: are limiting patient from ADLs, IADLs, and leisure.   CO-MORBIDITIES: may have co-morbidities  that affects occupational performance. Patient will benefit from skilled OT to address above impairments and improve overall function.  MODIFICATION OR ASSISTANCE TO COMPLETE EVALUATION: Min-Moderate modification of tasks or assist with assess necessary to complete an evaluation.  OT OCCUPATIONAL PROFILE AND  HISTORY: Problem focused assessment: Including review of records relating to presenting problem.  CLINICAL DECISION MAKING: LOW - limited treatment options, no task modification necessary  REHAB POTENTIAL: Fair given chronicity of symptoms  EVALUATION COMPLEXITY: Low    PLAN:  OT FREQUENCY: 2x/week  OT DURATION: 6 weeks  PLANNED INTERVENTIONS: 97168 OT Re-evaluation, 97535 self care/ADL training, 57846 therapeutic exercise, 97530 therapeutic activity, 97112 neuromuscular re-education, 97140 manual therapy, 97035 ultrasound, 97018 paraffin, 96295 fluidotherapy, 97010 moist heat, 97032 electrical stimulation (manual), 97760 Orthotics management and training, 28413 Splinting (initial encounter), [phone] Subsequent splinting/medication, functional mobility training, energy conservation, coping strategies training, patient/family education, and DME and/or AE instructions  RECOMMENDED OTHER SERVICES: none at this time  CONSULTED AND AGREED WITH PLAN OF CARE: Patient and family member/caregiver  PLAN FOR NEXT SESSION: Assess RUE fine motor coordination; RUE red putty and coordination HEPs; sensory precautions and stimulation   Delana Meyer, OT 03/21/2023, 5:29 PM

## 2023-03-22 NOTE — Addendum Note (Signed)
Addended by: Peter Congo on: 03/22/2023 08:37 AM   Modules accepted: Orders

## 2023-03-25 DIAGNOSIS — H53022 Refractive amblyopia, left eye: Secondary | ICD-10-CM | POA: Diagnosis not present

## 2023-03-25 DIAGNOSIS — I679 Cerebrovascular disease, unspecified: Secondary | ICD-10-CM | POA: Diagnosis not present

## 2023-03-25 DIAGNOSIS — E113393 Type 2 diabetes mellitus with moderate nonproliferative diabetic retinopathy without macular edema, bilateral: Secondary | ICD-10-CM | POA: Diagnosis not present

## 2023-03-25 DIAGNOSIS — Z961 Presence of intraocular lens: Secondary | ICD-10-CM | POA: Diagnosis not present

## 2023-03-25 DIAGNOSIS — H43823 Vitreomacular adhesion, bilateral: Secondary | ICD-10-CM | POA: Diagnosis not present

## 2023-03-25 DIAGNOSIS — H43811 Vitreous degeneration, right eye: Secondary | ICD-10-CM | POA: Diagnosis not present

## 2023-03-26 ENCOUNTER — Ambulatory Visit: Payer: Medicare PPO | Admitting: Occupational Therapy

## 2023-03-26 DIAGNOSIS — I69351 Hemiplegia and hemiparesis following cerebral infarction affecting right dominant side: Secondary | ICD-10-CM | POA: Diagnosis not present

## 2023-03-26 DIAGNOSIS — G8191 Hemiplegia, unspecified affecting right dominant side: Secondary | ICD-10-CM | POA: Diagnosis not present

## 2023-03-26 DIAGNOSIS — M6281 Muscle weakness (generalized): Secondary | ICD-10-CM | POA: Diagnosis not present

## 2023-03-26 DIAGNOSIS — R293 Abnormal posture: Secondary | ICD-10-CM | POA: Diagnosis not present

## 2023-03-26 DIAGNOSIS — R2681 Unsteadiness on feet: Secondary | ICD-10-CM | POA: Diagnosis not present

## 2023-03-26 DIAGNOSIS — R278 Other lack of coordination: Secondary | ICD-10-CM | POA: Diagnosis not present

## 2023-03-26 DIAGNOSIS — R2689 Other abnormalities of gait and mobility: Secondary | ICD-10-CM | POA: Diagnosis not present

## 2023-03-26 NOTE — Therapy (Signed)
OUTPATIENT OCCUPATIONAL THERAPY NEURO TREATMENT  Patient Name: Jeff [name] MRN: [id number] DOB:[birth year], 72 y.o., male Today's Date: 03/26/2023  PCP: Anson Fret, MD  REFERRING PROVIDER: Ihor Austin, NP  END OF SESSION:  OT End of Session - 03/26/23 1231     Visit Number 2    Number of Visits 13    Date for OT Re-Evaluation 05/03/23    Authorization Type [employer] - requires auth    Progress Note Due on Visit 10    OT Start Time 1233    OT Stop Time 1315    OT Time Calculation (min) 42 min    Activity Tolerance Patient tolerated treatment well    Behavior During Therapy WFL for tasks assessed/performed              Past Medical History:  Diagnosis Date   ADHD (attention deficit hyperactivity disorder)    Diabetes mellitus    Hyperlipidemia    Hypertension    Renal disorder    Seizures (HCC)    Stroke [facility])    Past Surgical History:  Procedure Laterality Date   IR ANGIO INTRA EXTRACRAN SEL COM CAROTID INNOMINATE BILAT MOD SED  01/03/2021   IR ANGIO VERTEBRAL SEL SUBCLAVIAN INNOMINATE UNI L MOD SED  01/03/2021   IR ANGIO VERTEBRAL SEL VERTEBRAL UNI R MOD SED  01/03/2021   IR RADIOLOGIST EVAL & MGMT  01/13/2021   IR US GUIDE VASC ACCESS RIGHT  01/03/2021   NECK SURGERY     SPINE SURGERY     Cervical spine x 2; Vear Clock; Critzer.   Patient Active Problem List   Diagnosis Date Noted   Benign essential hypertension 11/20/2022   Hypomagnesemia 11/20/2022   Falls 11/20/2022   Obstructive sleep apnea (adult) (pediatric) 11/20/2022   Syncope and collapse 11/20/2022   Uncontrolled type 2 diabetes mellitus with hypoglycemia, with long-term current use of insulin (HCC) 11/20/2022   Acute encephalopathy 11/20/2022   Hypoglycemia 11/20/2022   TIA (transient ischemic attack) 03/10/2020   CKD (chronic kidney disease) 08/28/2018   Retinopathy of both eyes 08/28/2018   Stroke due to embolism of left vertebral artery (HCC) 08/12/2018   Stroke (HCC) 08/08/2018    Diabetes mellitus (HCC) 07/25/2015   Neck injury 06/22/2015   Hearing loss in right ear 10/14/2011   Agent orange exposure 10/14/2011   ADD (attention deficit disorder) 10/14/2011   Hyperlipidemia 10/14/2011   BMI 28.0-28.9,adult 10/14/2011    ONSET DATE: 03/04/2023 (date of referral)  REFERRING DIAG: G81.91 (ICD-10-CM) - Right hemiparesis (HCC) R26.9 (ICD-10-CM) - Gait abnormality  THERAPY DIAG:  Muscle weakness (generalized)  Other lack of coordination  Hemiplegia and hemiparesis following cerebral infarction affecting right dominant side (HCC)  Rationale for Evaluation and Treatment: Rehabilitation  SUBJECTIVE:   SUBJECTIVE STATEMENT: His daughter told him to pinch a clothes pin in the right hand to help make it better, which he has been doing.   Pt accompanied by: significant other - [name]  PERTINENT HISTORY: PMH: hypertension, hyperlipidemia, diabetes mellitus, TIA, previous stroke (March 2020), h/o cervical fusion C5-7 (2012), h/o neck injury 2016, stage 3 CKD, seizures   Right sided weakness - decreased right hand dexterity, c/o numbness  PRECAUTIONS: Fall and Other: seizure  WEIGHT BEARING RESTRICTIONS: No  PAIN:  Are you having pain? No  FALLS: Has patient fallen in last 6 months? Yes. 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  HAND DOMINANCE:  Right  ADLs: Overall ADLs: mod I Transfers/ambulation related to ADLs: UB Dressing: difficulty with buttons on sleeves and cufflinks LB Dressing: assistance with belt management to reach behind back  IADLs:  Community mobility: dependent Medication management: min A  Handwriting:  doesn't write a lot  MOBILITY STATUS: Independent  ACTIVITY TOLERANCE: Activity tolerance: Good to fair  FUNCTIONAL OUTCOME MEASURES: Quick Dash: 29.5 % disability with use of R hand   UPPER EXTREMITY ROM and MMT:    BUE - WFL  HAND FUNCTION: Grip strength: Right: 19.4 lbs; Left: 69.6 lbs  COORDINATION: 03/26/2023: 9 -hole peg Right 37; Left 32  SENSATION: Reports severe paresthesias in R hand  EDEMA: none reported or observed  MUSCLE TONE: WFL  COGNITION: Overall cognitive status: Within functional limits for tasks assessed  PERCEPTION: Not tested  PRAXIS: Not tested  OBSERVATIONS: Pt appears well-kept. He ambulates without use of AD. No LOB.   TODAY'S TREATMENT:                                                                                                                              OT initiated RUE coordination HOME EXERCISE PROGRAM as noted in patient instructions including pick up and placement of items, shuffling and turning cards, item stacking and unstacking, rolling a piece of tissue paper into a ball, rolling golf balls in hand, rolling a pen in between fingers and thumb, picking up and storing items in hand, and then translating stored items to tips of thumb and index finger before placing them individually into a container. Patient able to return demonstration of all exercises. Handout provided.  OT initiated RUE red theraputty exercises (search, grip, pinch, and key grip) as noted in patient instructions for coordination and strength  PATIENT EDUCATION: Education details: red putty and coordination HEPs Person educated: Patient and Spouse Education method: Explanation,  Demonstration, and Handouts Education comprehension: verbalized understanding, returned demonstration, and needs further education  HOME EXERCISE PROGRAM: 03/26/2023: red putty and coordination HEPs  GOALS:  SHORT TERM GOALS: Target date: 04/19/2023    Patient will demonstrate independence with initial RUE HEP. Baseline: Goal status: INITIAL  2.  Pt will report no more than moderate difficulty washing back with RUE using adaptive strategies or AD as needed.  Baseline: severe difficulty Goal status: INITIAL  3.  Pt will report RUE as primary hand for completion of at least 3 daily activities. Baseline: only uses R hand with TV remote Goal status: INITIAL  4.  Pt will independently recall the 5 main sensory precautions (cold, heat, sharp, chemical, and heavy) as needed to prevent injury/harm secondary to impairments.   Baseline:  Goal status: INITIAL   LONG TERM GOALS: Target date: 05/03/2023  Patient will demonstrate updated RUE HEP with 25% verbal cues or less for proper execution. Baseline:  Goal status: INITIAL  2.  Pt will report use of RUE >50% of time vs non-dominant LUE. Baseline: less than 50% of the time Goal status: INITIAL  3.  Patient will demonstrate at least 30 lbs R grip strength as needed to open jars and other containers. Baseline: 19.4 lbs Goal status: INITIAL  4.  Pt will be able to open a tight or new jar with no more than moderate difficulty reported.  Baseline: unable Goal status: INITIAL  ASSESSMENT:  CLINICAL IMPRESSION: Patient demonstrates good understanding of HEP as needed to progress towards goals though requires rest breaks due to muscle fatigue.   PERFORMANCE DEFICITS: in functional skills including ADLs, IADLs, coordination, sensation, strength, Fine motor control, and UE functional use.   IMPAIRMENTS: are limiting patient from ADLs, IADLs, and leisure.   CO-MORBIDITIES: may have co-morbidities  that affects occupational  performance. Patient will benefit from skilled OT to address above impairments and improve overall function.  REHAB POTENTIAL: Fair given chronicity of symptoms  PLAN:  OT FREQUENCY: 2x/week  OT DURATION: 6 weeks  PLANNED INTERVENTIONS: 97168 OT Re-evaluation, 97535 self care/ADL training, 34742 therapeutic exercise, 97530 therapeutic activity, 97112 neuromuscular re-education, 97140 manual therapy, 97035 ultrasound, 97018 paraffin, 59563 fluidotherapy, 97010 moist heat, 97032 electrical stimulation (manual), 97760 Orthotics management and training, 87564 Splinting (initial encounter), [phone] Subsequent splinting/medication, functional mobility training, energy conservation, coping strategies training, patient/family education, and DME and/or AE instructions  RECOMMENDED OTHER SERVICES: none at this time  CONSULTED AND AGREED WITH PLAN OF CARE: Patient and family member/caregiver  PLAN FOR NEXT SESSION: review RUE red putty and coordination HEPs; sensory precautions and stimulation   Delana Meyer, OT 03/26/2023, 1:31 PM

## 2023-03-29 ENCOUNTER — Ambulatory Visit: Payer: Medicare PPO | Admitting: Physical Therapy

## 2023-03-29 ENCOUNTER — Ambulatory Visit: Payer: Medicare PPO | Admitting: Occupational Therapy

## 2023-04-02 ENCOUNTER — Ambulatory Visit: Payer: Medicare PPO | Admitting: Occupational Therapy

## 2023-04-02 ENCOUNTER — Ambulatory Visit: Payer: Medicare PPO | Admitting: Physical Therapy

## 2023-04-02 DIAGNOSIS — M6281 Muscle weakness (generalized): Secondary | ICD-10-CM

## 2023-04-02 DIAGNOSIS — I69351 Hemiplegia and hemiparesis following cerebral infarction affecting right dominant side: Secondary | ICD-10-CM

## 2023-04-02 DIAGNOSIS — R293 Abnormal posture: Secondary | ICD-10-CM

## 2023-04-02 DIAGNOSIS — R278 Other lack of coordination: Secondary | ICD-10-CM | POA: Diagnosis not present

## 2023-04-02 DIAGNOSIS — G8191 Hemiplegia, unspecified affecting right dominant side: Secondary | ICD-10-CM | POA: Diagnosis not present

## 2023-04-02 DIAGNOSIS — R2689 Other abnormalities of gait and mobility: Secondary | ICD-10-CM | POA: Diagnosis not present

## 2023-04-02 DIAGNOSIS — R2681 Unsteadiness on feet: Secondary | ICD-10-CM | POA: Diagnosis not present

## 2023-04-02 NOTE — Therapy (Cosign Needed)
OUTPATIENT PHYSICAL THERAPY NEURO TREATMENT   Patient Name: Jeff [name] MRN: [id number] DOB:09/08/1950, 72 y.o., male Today's Date: 04/03/2023   PCP: Anson Fret, MD  REFERRING PROVIDER: Ihor Austin, NP  END OF SESSION:  PT End of Session - 04/02/23 1402     Visit Number 2    Number of Visits 13   plus eval   Date for PT Re-Evaluation 05/17/23   to allow for scheduling delays   Authorization Type [employer]IC[employer] CHOICE PPO    PT Start Time 1402    PT Stop Time 1447    PT Time Calculation (min) 45 min    Equipment Utilized During Treatment Gait belt    Activity Tolerance Patient tolerated treatment well    Behavior During Therapy WFL for tasks assessed/performed              Past Medical History:  Diagnosis Date   ADHD (attention deficit hyperactivity disorder)    Diabetes mellitus    Hyperlipidemia    Hypertension    Renal disorder    Seizures (HCC)    Stroke [facility])    Past Surgical History:  Procedure Laterality Date   IR ANGIO INTRA EXTRACRAN SEL COM CAROTID INNOMINATE BILAT MOD SED  01/03/2021   IR ANGIO VERTEBRAL SEL SUBCLAVIAN INNOMINATE UNI L MOD SED  01/03/2021   IR ANGIO VERTEBRAL SEL VERTEBRAL UNI R MOD SED  01/03/2021   IR RADIOLOGIST EVAL & MGMT  01/13/2021   IR US GUIDE VASC ACCESS RIGHT  01/03/2021   NECK SURGERY     SPINE SURGERY     Cervical spine x 2; Vear Clock; Critzer.   Patient Active Problem List   Diagnosis Date Noted   Benign essential hypertension 11/20/2022   Hypomagnesemia 11/20/2022   Falls 11/20/2022   Obstructive sleep apnea (adult) (pediatric) 11/20/2022   Syncope and collapse 11/20/2022   Uncontrolled type 2 diabetes mellitus with hypoglycemia, with long-term current use of insulin (HCC) 11/20/2022   Acute encephalopathy 11/20/2022   Hypoglycemia 11/20/2022   TIA (transient ischemic attack) 03/10/2020   CKD (chronic kidney disease) 08/28/2018   Retinopathy of both eyes 08/28/2018   Stroke due to embolism of left vertebral  artery (HCC) 08/12/2018   Stroke (HCC) 08/08/2018   Diabetes mellitus (HCC) 07/25/2015   Neck injury 06/22/2015   Hearing loss in right ear 10/14/2011   Agent orange exposure 10/14/2011   ADD (attention deficit disorder) 10/14/2011   Hyperlipidemia 10/14/2011   BMI 28.0-28.9,adult 10/14/2011    ONSET DATE: 03/04/2023 (referral date)  REFERRING DIAG: G81.91 (ICD-10-CM) - Right hemiparesis (HCC)  THERAPY DIAG:  Muscle weakness (generalized)  Abnormal posture  Unsteadiness on feet  Other abnormalities of gait and mobility  Rationale for Evaluation and Treatment: Rehabilitation  SUBJECTIVE:  SUBJECTIVE STATEMENT:   "I'm about the same", "Well, I fell" on Saturday (10/26), pt was sitting on the bed putting on pants, leaned forwards and slid off of the bed to the floor. He did not hit his head but did scrape his back. Wife reports no bruising. He took a while, but turned onto knees and used furniture to stand. "Tom from the hospital taught me how to get up if I fell." Did not go to medical provider for this fall.  Pt accompanied by: family member: wife, [name]  PERTINENT HISTORY: hx of left cerebellum stroke (March 2020) with residual imbalance/gait impairment and right-sided ataxia, TIA (July 2022), transient AMS possibly in setting of hypoglycemia, left-sided headaches, hypertension, hyperlipidemia, diabetes mellitus, h/o cervical fusion C5-7 (2012), h/o neck injury 2016, stage 3 CKD, and intracranial stenosis   PAIN:  Are you having pain? Yes: NPRS scale: 7/10 Pain location: back Pain description: "pain! Pain!" Hard Aggravating factors: "every time I breathe I hurt", talking about it Relieving factors: "When [name] rubs it, but she doesn't rub it much any more"  PRECAUTIONS: Fall  RED  FLAGS: Bowel or bladder incontinence: No   WEIGHT BEARING RESTRICTIONS: No  FALLS: Has patient fallen in last 6 months? Yes. Number of falls "Too many to remember": "in the last 2 weeks I fell once", has not gone to hospital for any of these falls, has hit head with one- "fell out of bed and hit head on nightstand" needed help from wife to get away from the bed but stood on own. Had fall in bathroom. Not typically falling when walking, but getting up from sitting. No reports of dizziness or lightheadedness.   LIVING ENVIRONMENT: Lives with: lives with their spouse Lives in: House/apartment Stairs: Yes: External: 1 to porch, 1 to foyer steps; none can use ramp at back of house Has following equipment at home:  "I've got it all"  PLOF: Needs assistance with ADLs- some dressing like little buttons on shirt sleeves or cufflinks, has not driven in over 2 years  PATIENT GOALS: "walk good", "get back to as normal as I can"  OBJECTIVE:  Note: Objective measures were completed at Evaluation unless otherwise noted.  DIAGNOSTIC FINDINGS:   MRI HEAD WITHOUT AND WITH CONTRAST (11/21/2022) IMPRESSION: 1. No acute intracranial abnormality or mass. No specific findings to explain seizures. 2. Unchanged moderate chronic small-vessel disease and central pattern of volume loss.  04/08/2023  5:20 PM repeat MRI WO Contrast   SENSATION: R hand "has gone to sleep", feels like this all the time, pt feels that it is colder than the L , impaired sensation on the top of both feet  EDEMA:  "Feet will swell every once in a while"  LOWER EXTREMITY MMT:    MMT Right Eval Left Eval  Hip flexion 3 4-  Hip extension    Hip abduction    Hip adduction    Hip internal rotation    Hip external rotation    Knee flexion 3 5  Knee extension 3 5  Ankle dorsiflexion 3+ 5  Ankle plantarflexion    Ankle inversion    Ankle eversion    (Blank rows = not tested)   STAIRS: Level of Assistance: CGA and Min  A Stair Negotiation Technique: Step to Pattern with Bilateral Rails Number of Stairs: 4  Height of Stairs: standard  Comments: pt w/ increased difficulty descending requiring Min A for balance "I'm not sure I could have done that without your help"  GAIT: Gait pattern: step through pattern, decreased arm swing- Right, decreased arm swing- Left, decreased step length- Right, decreased step length- Left, decreased hip/knee flexion- Right, decreased ankle dorsiflexion- Right, decreased ankle dorsiflexion- Left, trunk flexed, poor foot clearance- Right, and poor foot clearance- Left Distance walked: various clinical distances Assistive device utilized: None Level of assistance: SBA Comments: consider assessing w/ ADs   TODAY'S TREATMENT:                                                                                                                               Blood Sugar at beginning of session: 273 (had to cancel Friday because it was low)  SEIZURES:  First: couple years   How often: not frequent- watching blood sugar, alarm is set for 100  Most recent: 3 weeks ago  Triggers: low blood sugar  Warning Signs: pt cannot tell  Usually last: varies  Look like: zones out, "I wet myself, I cry"  Medications: topiramate  Recovery looks like: "I don't even know I've had it until after its over" "they are very embarrassing" very emotional  Restrictions from neurologist: no driving   Hip abduction MMTs:  L 5 R 3   Attempted to assess mCTSIB, pt unable to stand in romberg stance on normal floor with eyes open, 0/120 Balance activities performed in corner with chair in front and CGA -> Min A Pt immediately leans all of weight into heels and has bottom and shoulders touch wall behind him in corner when attempting to stand in romberg stance without holding onto chair, requires Min A to recover to hold onto chair   NMR Pt's natural stance w/o UE support, eyes closed x30 seconds, CGA 1"  narrower than pt's natural stance w/o UE support, eyes open x30 seconds, CGA 1" narrower than pt's natural stance w/o UE support, eyes closed x30 seconds, CGA 2" narrower than pt's natural stance w/o UE support, eyes open x30 seconds, Min A Increased instability and AP sway as well as lean to R Staggered stance RLE in front w/o UE support, eyes open x10 seconds, Min A Increased instability, pt requires multiple attempts to maintain 10 second hold Staggered stance LLE in front w/o UE support, eyes open x10 seconds, Min A Increased instability, pt leans to R  Education to call attention to pt weightbearing only in heels of feet, recommended to try pressing through balls of feet as well Somewhat narrow stance w/o UE support, eyes open x30 seconds, CGA Pt initially with more Medial Lateral sway when trying to weight shift into balls of feet, improves throughout holding  Education for safe home set up for balance exercises, including finding an empty corner, putting sturdy furniture in front, and needing wife in the same room when trying these activities   PATIENT EDUCATION:  Education details: POC, HEP Person educated: Patient and Spouse Education method: Explanation, Demonstration, Tactile cues, Verbal cues, and Handouts Education comprehension: verbalized understanding, returned demonstration,  verbal cues required, tactile cues required, and needs further education  HOME EXERCISE PROGRAM:  To be initiated Access Code: [email or web address] URL: [email or web address] Date: 03/29/2023 Prepared by: Beverely Low  Exercises - Standing Balance in Corner  - 1 x daily - 7 x weekly - 3 sets - 10 reps - Standing Balance in Corner with Eyes Closed  - 1 x daily - 7 x weekly - 3 sets - 10 reps  GOALS: Goals reviewed with patient? No  SHORT TERM GOALS: Target date: 04/11/2023  Pt will improve FGA to at least 10/30 (MCID 4, Fall risk <22) for improved dynamic balance and safety.   Baseline:  6/30 (10/17) Goal status: INITIAL  2.  Pt will be independent with initial HEP for improved strength, balance, transfers and gait.  Baseline:  Goal status: INITIAL  3.  Pt will improve gait velocity to at least 2.50 ft/sec for improved gait efficiency and safety in the home Baseline: 1.93 ft/sec (03/21/23) Goal status: INITIAL  4.  Pt will hold romberg stance for 30 seconds with eyes open on solid ground with no LOB and SBA. Baseline:  Goal status: INITIAL   LONG TERM GOALS: Target date: 05/02/2023  Pt will improve FGA to at least 14/30 (MCID 4, Fall risk <22) for improved dynamic balance and safety.   Baseline: 6/30 (03/21/23) Goal status: INITIAL  2.  Pt will be independent with final HEP for improved strength, balance, transfers and gait.  Baseline:  Goal status: INITIAL  3.  Pt will improve gait velocity to at least 3.28 ft/sec (norm) for improved gait efficiency and safety in the community  Baseline: 1.93 ft/sec (03/21/23) Goal status: INITIAL  4.  Pt will hold romberg stance for 30 seconds with eyes closed on solid ground with no LOB and SBA. Baseline:  Goal status: INITIAL   ASSESSMENT:  CLINICAL IMPRESSION:  Emphasis of skilled PT session on further assessing seizure hx impact on POC, assessing standing balance, and static balance challenges. Pt seems to have a good understanding that his seizures are largely triggered by low blood sugars, however, he reports not being able to feel decreasing blood sugar, plan to ask pt frequently to check. Pt unable to maintain balance in romberg stance w/ eyes open on solid surface, challenged by narrowing stance progression from natural stance. Pt with good understanding of initial HEP and safety for practicing at home. Continue POC.   OBJECTIVE IMPAIRMENTS: Abnormal gait, decreased activity tolerance, decreased balance, decreased endurance, decreased knowledge of use of DME, decreased mobility, difficulty walking, decreased ROM,  decreased strength, decreased safety awareness, dizziness, hypomobility, impaired flexibility, and impaired sensation.   ACTIVITY LIMITATIONS: lifting, bending, squatting, stairs, transfers, bathing, dressing, and hygiene/grooming  PARTICIPATION LIMITATIONS: meal prep, cleaning, laundry, driving, shopping, community activity, occupation, and yard work  PERSONAL FACTORS: Age, Time since onset of injury/illness/exacerbation, and 3+ comorbidities:    hx left cerebellum stroke, TIA (July 2022), transient AMS possibly in setting of hypoglycemia, left-sided headaches, hypertension, hyperlipidemia, diabetes mellitus, h/o cervical fusion C5-7 (2012), h/o neck injury 2016, stage 3 CKD, and intracranial stenosis are also affecting patient's functional outcome.   REHAB POTENTIAL: Fair original stroke was in 2020, potentially had a new stroke, balance and gait have declined since last seen at this clinic  CLINICAL DECISION MAKING: Evolving/moderate complexity  EVALUATION COMPLEXITY: Moderate  PLAN:   PT FREQUENCY: 2x/week  PT DURATION: 6 weeks  PLANNED INTERVENTIONS: 97164- PT Re-evaluation, 97110-Therapeutic exercises, 97530- Therapeutic activity, [id number]- Neuromuscular re-education, 97535- Self Care, 16109-  Manual therapy, [id number]- Gait training, 95621- Canalith repositioning, 30865- Electrical stimulation (unattended), [phone]- Electrical stimulation (manual), Patient/Family education, Balance training, Stair training, Taping, Dry Needling, Joint mobilization, Joint manipulation, Spinal manipulation, Spinal mobilization, Vestibular training, DME instructions, Cryotherapy, and Moist heat  PLAN FOR NEXT SESSION: Add to HEP as appropriate for balance, LE and core strengthening, NMR, endurance; assess if RW improves gait, vestibular system impairments?, mCTSIB, RLE strengthening, balance and gait training, very unsafe w/ most dynamic gait challenges: guard closely, clamshells for R, posterior pelvic tilt+bridge,  marching, bent knee fall outs, sit-to-stand variations, counter support- marches, extension, abduction, heel raises, standing balance progress towards tandem stance Hx of seizures, no blaze pods, ask about blood glucose (low)   Beverely Low, Student-PT 04/03/2023, 8:40 AM

## 2023-04-02 NOTE — Therapy (Signed)
OUTPATIENT OCCUPATIONAL THERAPY NEURO TREATMENT  Patient Name: Jeff [name] MRN: [id number] DOB:1950-07-31, 72 y.o., male Today's Date: 04/02/2023  PCP: Anson Fret, MD  REFERRING PROVIDER: Ihor Austin, NP  END OF SESSION:  OT End of Session - 04/02/23 1315     Visit Number 3    Number of Visits 13    Date for OT Re-Evaluation 05/03/23    Authorization Type [employer] - requires auth    Progress Note Due on Visit 10    OT Start Time 1317    OT Stop Time 1355    OT Time Calculation (min) 38 min    Activity Tolerance Patient tolerated treatment well    Behavior During Therapy WFL for tasks assessed/performed              Past Medical History:  Diagnosis Date   ADHD (attention deficit hyperactivity disorder)    Diabetes mellitus    Hyperlipidemia    Hypertension    Renal disorder    Seizures (HCC)    Stroke [facility])    Past Surgical History:  Procedure Laterality Date   IR ANGIO INTRA EXTRACRAN SEL COM CAROTID INNOMINATE BILAT MOD SED  01/03/2021   IR ANGIO VERTEBRAL SEL SUBCLAVIAN INNOMINATE UNI L MOD SED  01/03/2021   IR ANGIO VERTEBRAL SEL VERTEBRAL UNI R MOD SED  01/03/2021   IR RADIOLOGIST EVAL & MGMT  01/13/2021   IR US GUIDE VASC ACCESS RIGHT  01/03/2021   NECK SURGERY     SPINE SURGERY     Cervical spine x 2; Vear Clock; Critzer.   Patient Active Problem List   Diagnosis Date Noted   Benign essential hypertension 11/20/2022   Hypomagnesemia 11/20/2022   Falls 11/20/2022   Obstructive sleep apnea (adult) (pediatric) 11/20/2022   Syncope and collapse 11/20/2022   Uncontrolled type 2 diabetes mellitus with hypoglycemia, with long-term current use of insulin (HCC) 11/20/2022   Acute encephalopathy 11/20/2022   Hypoglycemia 11/20/2022   TIA (transient ischemic attack) 03/10/2020   CKD (chronic kidney disease) 08/28/2018   Retinopathy of both eyes 08/28/2018   Stroke due to embolism of left vertebral artery (HCC) 08/12/2018   Stroke (HCC) 08/08/2018    Diabetes mellitus (HCC) 07/25/2015   Neck injury 06/22/2015   Hearing loss in right ear 10/14/2011   Agent orange exposure 10/14/2011   ADD (attention deficit disorder) 10/14/2011   Hyperlipidemia 10/14/2011   BMI 28.0-28.9,adult 10/14/2011    ONSET DATE: 03/04/2023 (date of referral)  REFERRING DIAG: G81.91 (ICD-10-CM) - Right hemiparesis (HCC) R26.9 (ICD-10-CM) - Gait abnormality  THERAPY DIAG:  Muscle weakness (generalized)  Other lack of coordination  Hemiplegia and hemiparesis following cerebral infarction affecting right dominant side (HCC)  Rationale for Evaluation and Treatment: Rehabilitation  SUBJECTIVE:   SUBJECTIVE STATEMENT: Pt reports recent fall (updated under "falls" below) and reports "it hurt." Pt reports completing finger exercises though not attempting theraputty exercises. Pt requested to work on buttons. Pt and pt's spouse reports pt is working with clothes pin to increase pinch strength.  Pt accompanied by: significant other - [name]  PERTINENT HISTORY: PMH: hypertension, hyperlipidemia, diabetes mellitus, TIA, previous stroke (March 2020), h/o cervical fusion C5-7 (2012), h/o neck injury 2016, stage 3 CKD, seizures   Right sided weakness - decreased right hand dexterity, c/o numbness  PRECAUTIONS: Fall and Other: seizure  WEIGHT BEARING RESTRICTIONS: No  PAIN:  Are you having pain? No  FALLS: Has patient fallen in last 6 months? Yes. Number of falls "many" ~6 .  Pt reports recent fall on 03/30/23 attempting to don pants while seated and slipped off side of bed.  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  HAND DOMINANCE: Right  ADLs: Overall ADLs: mod I Transfers/ambulation related to ADLs: UB Dressing: difficulty with buttons on sleeves and cufflinks LB Dressing: assistance with belt management to reach behind back  IADLs:  Community mobility: dependent Medication management: min A  Handwriting:  doesn't write a lot  MOBILITY STATUS: Independent  ACTIVITY TOLERANCE: Activity tolerance: Good to fair  FUNCTIONAL OUTCOME MEASURES: Quick Dash: 29.5 % disability with use of R hand   UPPER EXTREMITY ROM and MMT:    BUE - WFL  HAND FUNCTION: Grip strength: Right: 19.4 lbs; Left: 69.6 lbs  COORDINATION: 03/26/2023: 9 -hole peg Right 37; Left 32  SENSATION: Reports severe paresthesias in R hand  EDEMA: none reported or observed  MUSCLE TONE: WFL  COGNITION: Overall cognitive status: Within functional limits for tasks assessed  PERCEPTION: Not tested  PRAXIS: Not tested  OBSERVATIONS: Pt appears well-kept. He ambulates without use of AD. No LOB.   TODAY'S TREATMENT:                                                                                                                               Therapeutic Activities Placing clips with functional reach and return, standing, CGA with gait belt -  to improve RUE pinch strengthening, coordination, functional reach - Pt placed and took off x7 blue clips and x7 black clips (2 sets), functional reach. Alternating colors to improve coordination.  Self-Care Buttons - to increase ind for ADL/IADL dressing/laundry tasks, to improve FM coordination of RUE - Pt trialed buttoning x5 buttons on a shirt at tabletop level. OT noted pt demo'd some difficulty d/t decreased coordination. Therefore,  OT educated pt on button hook. Pt demo'd understanding and demo'd increased speed when buttoning shirt with button hook at tabletop level.   Pt then attempted to button donned shirt with button hook. Pt demo'd increased difficulty and benefited from mod v/c for placement of button hook. Pt reported frustration by stating "I just prefer to button normally." Pt then buttoned donned shirt ind. OT and pt discussed using button hook when completing buttoning tasks at tabletop level for laundry and buttoning without button hook when donning shirts. Pt agreed and verbalized understanding.  Pt and pt's spouse reported having button hook available at home.  Therapeutic  Activities OT educated pt on safety considerations for loss of sensation in affected UE. Pt verbalized understanding.  Grasp of styrofoam cup - to improve RUE sensation, grading of force during grasp - Pt practiced "light" and "heavy" grasp of styrofoam cup with min visual cues and v/c. 5 reps each.  Locating items in dried rice container with eyes closed - to improve RUE sensation, stereognosis, proprioception - Pt located items of comparative sizes using sense of touch (e.g. "find an item smaller than this item").  PATIENT EDUCATION: Education details: see today's treatment above Person educated: Patient and Spouse Education method: Explanation, Demonstration, and Handouts Education comprehension: verbalized understanding, returned demonstration, verbal cues required, tactile cues required, and needs further education  HOME EXERCISE PROGRAM: 03/26/2023: red putty and coordination HEPs 04/02/23 - sensory safety considerations (handout provided, see pt instructions)  GOALS:  SHORT TERM GOALS: Target date: 04/19/2023    Patient will demonstrate independence with initial RUE HEP. Baseline: Goal status: INITIAL  2.  Pt will report no more than moderate difficulty washing back with RUE using adaptive strategies or AD as needed.   Baseline: severe difficulty Goal status: INITIAL  3.  Pt will report RUE as primary hand for completion of at least 3 daily activities. Baseline: only uses R hand with TV remote Goal status: INITIAL  4.  Pt will independently recall the 5 main sensory precautions (cold, heat, sharp, chemical, and heavy) as needed to prevent injury/harm secondary to impairments.   Baseline:  Goal status: INITIAL   LONG TERM GOALS: Target date: 05/03/2023  Patient will demonstrate updated RUE HEP with 25% verbal cues or less for proper execution. Baseline:  Goal status: INITIAL  2.  Pt will report use of RUE >50% of time vs non-dominant LUE. Baseline: less than 50% of the time Goal status: INITIAL  3.  Patient will demonstrate at least 30 lbs R grip strength as needed to open jars and other containers. Baseline: 19.4 lbs Goal status: INITIAL  4.  Pt will be able to open a tight or new jar with no more than moderate difficulty reported.  Baseline: unable Goal status: INITIAL  ASSESSMENT:  CLINICAL IMPRESSION: Pt tolerated treatment well. Pt benefited from additional v/c and repeated verbal instructions PRN. Pt would benefit from skilled OT services to address deficits, increase overall independence, and return patient to PLOF as able.    PERFORMANCE DEFICITS: in functional skills including ADLs, IADLs, coordination, sensation, strength, Fine motor control, and UE functional use.   IMPAIRMENTS: are limiting patient from ADLs, IADLs, and leisure.   CO-MORBIDITIES: may have co-morbidities  that affects occupational performance. Patient will benefit from skilled OT to address above impairments and improve overall function.  REHAB POTENTIAL: Fair given chronicity of symptoms  PLAN:  OT FREQUENCY: 2x/week  OT DURATION: 6 weeks  PLANNED INTERVENTIONS: 97168 OT Re-evaluation, 97535 self care/ADL training, 14782 therapeutic exercise, 97530 therapeutic activity, 97112 neuromuscular re-education,  97140 manual therapy, 97035 ultrasound, 97018 paraffin, 95621 fluidotherapy, 97010 moist heat, 97032 electrical stimulation (manual), 97760 Orthotics management and training, 30865 Splinting (initial encounter), [phone] Subsequent splinting/medication, functional mobility training, energy conservation, coping strategies training, patient/family education, and DME and/or AE instructions  RECOMMENDED OTHER SERVICES: none at this time  CONSULTED AND AGREED WITH PLAN OF CARE: Patient and family member/caregiver  PLAN FOR NEXT SESSION:  review RUE red putty and coordination HEPs Review sensory precautions and stimulation Coordination tasks ADL - discuss adaptive strategies and A/E (e.g. bath mit or long-handled sponge) to increase  ind with bathing tasks   Wynetta Emery, OT 04/02/2023, 4:56 PM

## 2023-04-04 ENCOUNTER — Encounter: Payer: Self-pay | Admitting: Physical Therapy

## 2023-04-04 ENCOUNTER — Ambulatory Visit: Payer: Medicare PPO | Admitting: Physical Therapy

## 2023-04-04 ENCOUNTER — Ambulatory Visit: Payer: Medicare PPO | Admitting: Occupational Therapy

## 2023-04-04 DIAGNOSIS — R2681 Unsteadiness on feet: Secondary | ICD-10-CM | POA: Diagnosis not present

## 2023-04-04 DIAGNOSIS — G8191 Hemiplegia, unspecified affecting right dominant side: Secondary | ICD-10-CM | POA: Diagnosis not present

## 2023-04-04 DIAGNOSIS — M6281 Muscle weakness (generalized): Secondary | ICD-10-CM

## 2023-04-04 DIAGNOSIS — I69351 Hemiplegia and hemiparesis following cerebral infarction affecting right dominant side: Secondary | ICD-10-CM | POA: Diagnosis not present

## 2023-04-04 DIAGNOSIS — R2689 Other abnormalities of gait and mobility: Secondary | ICD-10-CM | POA: Diagnosis not present

## 2023-04-04 DIAGNOSIS — R278 Other lack of coordination: Secondary | ICD-10-CM | POA: Diagnosis not present

## 2023-04-04 DIAGNOSIS — R293 Abnormal posture: Secondary | ICD-10-CM | POA: Diagnosis not present

## 2023-04-04 NOTE — Therapy (Signed)
OUTPATIENT PHYSICAL THERAPY NEURO TREATMENT   Patient Name: Jeff [name] MRN: [id number] DOB:1951-01-21, 72 y.o., male Today's Date: 04/04/2023   PCP: Anson Fret, MD  REFERRING PROVIDER: Ihor Austin, NP  END OF SESSION:  PT End of Session - 04/04/23 1947     Visit Number 3    Number of Visits 13   plus eval   Date for PT Re-Evaluation 05/17/23   to allow for scheduling delays   Authorization Type [employer] CHOICE PPO    Authorization Time Period 10-17 - 05-17-23    Authorization - Visit Number 3    Authorization - Number of Visits 13    PT Start Time 1401    PT Stop Time 1445    PT Time Calculation (min) 44 min    Activity Tolerance Patient tolerated treatment well    Behavior During Therapy WFL for tasks assessed/performed               Past Medical History:  Diagnosis Date   ADHD (attention deficit hyperactivity disorder)    Diabetes mellitus    Hyperlipidemia    Hypertension    Renal disorder    Seizures (HCC)    Stroke (HCC)    Past Surgical History:  Procedure Laterality Date   IR ANGIO INTRA EXTRACRAN SEL COM CAROTID INNOMINATE BILAT MOD SED  01/03/2021   IR ANGIO VERTEBRAL SEL SUBCLAVIAN INNOMINATE UNI L MOD SED  01/03/2021   IR ANGIO VERTEBRAL SEL VERTEBRAL UNI R MOD SED  01/03/2021   IR RADIOLOGIST EVAL & MGMT  01/13/2021   IR US GUIDE VASC ACCESS RIGHT  01/03/2021   NECK SURGERY     SPINE SURGERY     Cervical spine x 2; Vear Clock; Critzer.   Patient Active Problem List   Diagnosis Date Noted   Benign essential hypertension 11/20/2022   Hypomagnesemia 11/20/2022   Falls 11/20/2022   Obstructive sleep apnea (adult) (pediatric) 11/20/2022   Syncope and collapse 11/20/2022   Uncontrolled type 2 diabetes mellitus with hypoglycemia, with long-term current use of insulin (HCC) 11/20/2022   Acute encephalopathy 11/20/2022   Hypoglycemia 11/20/2022   TIA (transient ischemic attack) 03/10/2020   CKD (chronic kidney disease) 08/28/2018    Retinopathy of both eyes 08/28/2018   Stroke due to embolism of left vertebral artery (HCC) 08/12/2018   Stroke (HCC) 08/08/2018   Diabetes mellitus (HCC) 07/25/2015   Neck injury 06/22/2015   Hearing loss in right ear 10/14/2011   Agent orange exposure 10/14/2011   ADD (attention deficit disorder) 10/14/2011   Hyperlipidemia 10/14/2011   BMI 28.0-28.9,adult 10/14/2011    ONSET DATE: 03/04/2023 (referral date)  REFERRING DIAG: G81.91 (ICD-10-CM) - Right hemiparesis (HCC)  THERAPY DIAG:  Muscle weakness (generalized)  Unsteadiness on feet  Rationale for Evaluation and Treatment: Rehabilitation  SUBJECTIVE:  SUBJECTIVE STATEMENT:   Pt reports he has not been doing his exercises at home -  says he is unable to stand with eyes closed; Blood sugar level 333 at start of session - says he has been drinking a lot of water all day in attempt to get it to come down; pt states he feels fine  Pt accompanied by: family member: wife, [name]  PERTINENT HISTORY: hx of left cerebellum stroke (March 2020) with residual imbalance/gait impairment and right-sided ataxia, TIA (July 2022), transient AMS possibly in setting of hypoglycemia, left-sided headaches, hypertension, hyperlipidemia, diabetes mellitus, h/o cervical fusion C5-7 (2012), h/o neck injury 2016, stage 3 CKD, and intracranial stenosis   PAIN:  Are you having pain? Yes: NPRS scale: 7/10 Pain location: back Pain description: "pain! Pain!" Hard Aggravating factors: "every time I breathe I hurt", talking about it Relieving factors: "When [name] rubs it, but she doesn't rub it much any more"  PRECAUTIONS: Fall  RED FLAGS: Bowel or bladder incontinence: No   WEIGHT BEARING RESTRICTIONS: No  FALLS: Has patient fallen in last 6 months? Yes. Number of  falls "Too many to remember": "in the last 2 weeks I fell once", has not gone to hospital for any of these falls, has hit head with one- "fell out of bed and hit head on nightstand" needed help from wife to get away from the bed but stood on own. Had fall in bathroom. Not typically falling when walking, but getting up from sitting. No reports of dizziness or lightheadedness.   LIVING ENVIRONMENT: Lives with: lives with their spouse Lives in: House/apartment Stairs: Yes: External: 1 to porch, 1 to foyer steps; none can use ramp at back of house Has following equipment at home:  "I've got it all"  PLOF: Needs assistance with ADLs- some dressing like little buttons on shirt sleeves or cufflinks, has not driven in over 2 years  PATIENT GOALS: "walk good", "get back to as normal as I can"  OBJECTIVE:  Note: Objective measures were completed at Evaluation unless otherwise noted.  DIAGNOSTIC FINDINGS:   MRI HEAD WITHOUT AND WITH CONTRAST (11/21/2022) IMPRESSION: 1. No acute intracranial abnormality or mass. No specific findings to explain seizures. 2. Unchanged moderate chronic small-vessel disease and central pattern of volume loss.  04/08/2023  5:20 PM repeat MRI WO Contrast   SENSATION: R hand "has gone to sleep", feels like this all the time, pt feels that it is colder than the L , impaired sensation on the top of both feet  EDEMA:  "Feet will swell every once in a while"  LOWER EXTREMITY MMT:    MMT Right Eval Left Eval  Hip flexion 3 4-  Hip extension    Hip abduction    Hip adduction    Hip internal rotation    Hip external rotation    Knee flexion 3 5  Knee extension 3 5  Ankle dorsiflexion 3+ 5  Ankle plantarflexion    Ankle inversion    Ankle eversion    (Blank rows = not tested)   STAIRS:ta Level of Assistance: CGA and Min A Stair Negotiation Technique: Step to Pattern with Bilateral Rails Number of Stairs: 4  Height of Stairs: standard  Comments: pt w/  increased difficulty descending requiring Min A for balance "I'm not sure I could have done that without your help"  GAIT: Gait pattern: step through pattern, decreased arm swing- Right, decreased arm swing- Left, decreased step length- Right, decreased step length- Left, decreased hip/knee flexion-  Right, decreased ankle dorsiflexion- Right, decreased ankle dorsiflexion- Left, trunk flexed, poor foot clearance- Right, and poor foot clearance- Left Distance walked: various clinical distances Assistive device utilized: None Level of assistance: SBA Comments: consider assessing w/ ADs   TODAY'S TREATMENT:                                                                                                                               Blood Sugar at beginning of session: 333  NeuroRe-ed: Sit to stand from mat without UE support (hands on thighs) 1 rep  Standing balance exercises at counter with UE support -     Forward, backward and side kicks alternating LE's - 5 reps each direction each leg (due to fatigue)    Marching in place 10 reps each leg     Bil. Heel raises 10 reps with bil. UE support on balance bar at mirror  TherEx: for RLE strengthening:   Bridging x 5 reps   RLE SLR 10 reps with initially min assist to lift leg to height of Lt flexed knee; 2nd set 10 reps pt performed independently   Rt hip flexion in hooklying position 10 reps   Rt hip abduction in Lt sidelying position 10 reps with min assist initially   Rt clam shell 10 reps in Lt sidelying position   Sit to stand with Lt foot on 4" step from high/low mat table 5 reps with LUE support  Standing tap ups to 4" step with LLE for RLE closed chain strengthening Seated RLE LAQ's with 2# 10 reps Standing Rt knee flexion with 2# weight 4 reps due to fatigue - exercise stopped due to pt reporting RLE getting very weak and tired  PATIENT EDUCATION:  Education details: POC, HEP Person educated: Patient and Spouse Education  method: Explanation, Demonstration, Tactile cues, Verbal cues, and Handouts Education comprehension: verbalized understanding, returned demonstration, verbal cues required, tactile cues required, and needs further education  HOME EXERCISE PROGRAM:  To be initiated Access Code: [email or web address] URL: [email or web address] Date: 03/29/2023 Prepared by: Beverely Low  Exercises - Standing Balance in Corner  - 1 x daily - 7 x weekly - 3 sets - 10 reps - Standing Balance in Corner with Eyes Closed  - 1 x daily - 7 x weekly - 3 sets - 10 reps  GOALS: Goals reviewed with patient? No  SHORT TERM GOALS: Target date: 04/11/2023  Pt will improve FGA to at least 10/30 (MCID 4, Fall risk <22) for improved dynamic balance and safety.   Baseline: 6/30 (10/17) Goal status: INITIAL  2.  Pt will be independent with initial HEP for improved strength, balance, transfers and gait.  Baseline:  Goal status: INITIAL  3.  Pt will improve gait velocity to at least 2.50 ft/sec for improved gait efficiency and safety in the home Baseline: 1.93 ft/sec (03/21/23) Goal status: INITIAL  4.  Pt will hold romberg stance for 30 seconds with eyes open  on solid ground with no LOB and SBA. Baseline:  Goal status: INITIAL   LONG TERM GOALS: Target date: 05/02/2023  Pt will improve FGA to at least 14/30 (MCID 4, Fall risk <22) for improved dynamic balance and safety.   Baseline: 6/30 (03/21/23) Goal status: INITIAL  2.  Pt will be independent with final HEP for improved strength, balance, transfers and gait.  Baseline:  Goal status: INITIAL  3.  Pt will improve gait velocity to at least 3.28 ft/sec (norm) for improved gait efficiency and safety in the community  Baseline: 1.93 ft/sec (03/21/23) Goal status: INITIAL  4.  Pt will hold romberg stance for 30 seconds with eyes closed on solid ground with no LOB and SBA. Baseline:  Goal status: INITIAL   ASSESSMENT:  CLINICAL IMPRESSION:  PT session  focused on RLE strengthening exercises and on standing balance exercises to improve SLS on RLE.  Pt's RLE fatigued with standing bil. Heel raise exercise, with tremoring noted, requiring seated rest period after 10 reps completed.  Pt reported some discomfort with Rt SLR but none reported with Rt hip flexion in hooklying position.  HEP to be established.  Continue POC.   OBJECTIVE IMPAIRMENTS: Abnormal gait, decreased activity tolerance, decreased balance, decreased endurance, decreased knowledge of use of DME, decreased mobility, difficulty walking, decreased ROM, decreased strength, decreased safety awareness, dizziness, hypomobility, impaired flexibility, and impaired sensation.   ACTIVITY LIMITATIONS: lifting, bending, squatting, stairs, transfers, bathing, dressing, and hygiene/grooming  PARTICIPATION LIMITATIONS: meal prep, cleaning, laundry, driving, shopping, community activity, occupation, and yard work  PERSONAL FACTORS: Age, Time since onset of injury/illness/exacerbation, and 3+ comorbidities:    hx left cerebellum stroke, TIA (July 2022), transient AMS possibly in setting of hypoglycemia, left-sided headaches, hypertension, hyperlipidemia, diabetes mellitus, h/o cervical fusion C5-7 (2012), h/o neck injury 2016, stage 3 CKD, and intracranial stenosis are also affecting patient's functional outcome.   REHAB POTENTIAL: Fair original stroke was in 2020, potentially had a new stroke, balance and gait have declined since last seen at this clinic  CLINICAL DECISION MAKING: Evolving/moderate complexity  EVALUATION COMPLEXITY: Moderate  PLAN:   PT FREQUENCY: 2x/week  PT DURATION: 6 weeks  PLANNED INTERVENTIONS: 97164- PT Re-evaluation, 97110-Therapeutic exercises, 97530- Therapeutic activity, 97112- Neuromuscular re-education, 97535- Self Care, 40981- Manual therapy, [phone]- Gait training, [phone]- Canalith repositioning, 97014- Electrical stimulation (unattended), [phone]- Electrical  stimulation (manual), Patient/Family education, Balance training, Stair training, Taping, Dry Needling, Joint mobilization, Joint manipulation, Spinal manipulation, Spinal mobilization, Vestibular training, DME instructions, Cryotherapy, and Moist heat  PLAN FOR NEXT SESSION: Review exercises for HEP for RLE strengthening (pt noncompliant with corner balance exercises issued); cont gait and balance training  Add to HEP as appropriate for balance, LE and core strengthening, NMR, endurance; assess if RW improves gait, vestibular system impairments?, mCTSIB, RLE strengthening, balance and gait training, very unsafe w/ most dynamic gait challenges: guard closely, clamshells for R, posterior pelvic tilt+bridge, marching, bent knee fall outs, sit-to-stand variations, counter support- marches, extension, abduction, heel raises, standing balance progress towards tandem stance Hx of seizures, no blaze pods, ask about blood glucose (low)   Charlen Bakula, Donavan Burnet, PT 04/04/2023, 8:17 PM

## 2023-04-04 NOTE — Therapy (Signed)
OUTPATIENT OCCUPATIONAL THERAPY NEURO TREATMENT  Patient Name: Jeff [name] MRN: [id number] DOB:20-Dec-1950, 72 y.o., male Today's Date: 04/04/2023  PCP: Anson Fret, MD  REFERRING PROVIDER: Ihor Austin, NP  END OF SESSION:  OT End of Session - 04/04/23 1447     Visit Number 4    Number of Visits 13    Date for OT Re-Evaluation 05/03/23    Authorization Type [employer] - requires auth    Progress Note Due on Visit 10    OT Start Time 1449    OT Stop Time 1529    OT Time Calculation (min) 40 min    Activity Tolerance Patient tolerated treatment well    Behavior During Therapy WFL for tasks assessed/performed               Past Medical History:  Diagnosis Date   ADHD (attention deficit hyperactivity disorder)    Diabetes mellitus    Hyperlipidemia    Hypertension    Renal disorder    Seizures (HCC)    Stroke [facility])    Past Surgical History:  Procedure Laterality Date   IR ANGIO INTRA EXTRACRAN SEL COM CAROTID INNOMINATE BILAT MOD SED  01/03/2021   IR ANGIO VERTEBRAL SEL SUBCLAVIAN INNOMINATE UNI L MOD SED  01/03/2021   IR ANGIO VERTEBRAL SEL VERTEBRAL UNI R MOD SED  01/03/2021   IR RADIOLOGIST EVAL & MGMT  01/13/2021   IR US GUIDE VASC ACCESS RIGHT  01/03/2021   NECK SURGERY     SPINE SURGERY     Cervical spine x 2; Vear Clock; Critzer.   Patient Active Problem List   Diagnosis Date Noted   Benign essential hypertension 11/20/2022   Hypomagnesemia 11/20/2022   Falls 11/20/2022   Obstructive sleep apnea (adult) (pediatric) 11/20/2022   Syncope and collapse 11/20/2022   Uncontrolled type 2 diabetes mellitus with hypoglycemia, with long-term current use of insulin (HCC) 11/20/2022   Acute encephalopathy 11/20/2022   Hypoglycemia 11/20/2022   TIA (transient ischemic attack) 03/10/2020   CKD (chronic kidney disease) 08/28/2018   Retinopathy of both eyes 08/28/2018   Stroke due to embolism of left vertebral artery (HCC) 08/12/2018   Stroke (HCC) 08/08/2018    Diabetes mellitus (HCC) 07/25/2015   Neck injury 06/22/2015   Hearing loss in right ear 10/14/2011   Agent orange exposure 10/14/2011   ADD (attention deficit disorder) 10/14/2011   Hyperlipidemia 10/14/2011   BMI 28.0-28.9,adult 10/14/2011    ONSET DATE: 03/04/2023 (date of referral)  REFERRING DIAG: G81.91 (ICD-10-CM) - Right hemiparesis (HCC) R26.9 (ICD-10-CM) - Gait abnormality  THERAPY DIAG:  Muscle weakness (generalized)  Other lack of coordination  Hemiplegia and hemiparesis following cerebral infarction affecting right dominant side (HCC)  Rationale for Evaluation and Treatment: Rehabilitation  SUBJECTIVE:   SUBJECTIVE STATEMENT: Pt reports pain/fatigue after PT session today. Pt reports no recent falls. Pt reports not using theraputty at home. Pt is using clothespins at home and "some finger exercises." Pt reports not completing coordination tasks except for stacking pennies and placing pennies in container.   Pt accompanied by: significant other - [name]  PERTINENT HISTORY: PMH: hypertension, hyperlipidemia, diabetes mellitus, TIA, previous stroke (March 2020), h/o cervical fusion C5-7 (2012), h/o neck injury 2016, stage 3 CKD, seizures   Right sided weakness - decreased right hand dexterity, c/o numbness  PRECAUTIONS: Fall and Other: seizure  WEIGHT BEARING RESTRICTIONS: No  PAIN:  Are you having pain? Yes: NPRS scale: 3/10 Pain location: "in my legs" Pain description: aching Aggravating factors:  exercises Relieving factors: rest  FALLS: Has patient fallen in last 6 months? Yes. Number of falls "many" ~6 . Pt reported recent fall on 03/30/23 attempting to don pants while seated and slipped off side of bed.  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  HAND DOMINANCE: Right  ADLs: Overall ADLs: mod I Transfers/ambulation related to ADLs: UB Dressing: difficulty with buttons on sleeves and cufflinks LB Dressing: assistance with belt management to reach behind back  IADLs:  Community mobility: dependent Medication management: min A  Handwriting:  doesn't write a lot  MOBILITY STATUS: Independent  ACTIVITY TOLERANCE: Activity tolerance: Good to fair  FUNCTIONAL OUTCOME MEASURES: Quick Dash: 29.5 % disability with use of R hand   UPPER EXTREMITY ROM and MMT:    BUE - WFL  HAND FUNCTION: Grip strength: Right: 19.4 lbs; Left: 69.6 lbs  COORDINATION: 03/26/2023: 9 -hole peg Right 37; Left 32  SENSATION: Reports severe paresthesias in R hand  EDEMA: none reported or observed  MUSCLE TONE: WFL  COGNITION: Overall cognitive status: Within functional limits for tasks assessed  PERCEPTION: Not tested  PRAXIS: Not tested  OBSERVATIONS: Pt appears well-kept. He ambulates without use of AD. No LOB.   TODAY'S TREATMENT:                                                                                                                               Therapeutic Activities OT reviewed sensory precautions with pt. Pt named 3 out of 5 sensory precautions ind (hot, cold, sharp) and required v/c for remaining (heavy, breakable, chemical).  Pt stated he had improved grip strength, therefore grip strength assessed and LTG updated. Pt met LTG grip strength today.  Pt reported not consistently completing theraputty and coordination HEP. OT reviewed RUE  theraputty and coordination HEP with pt. Pt verbalized understanding for coordination and demo'd understanding of each theraputty exercise (5 reps each) with mod v/c. OT educated on importance of completing tasks to improve RUE grip strength and coordination. Pt verbalized understanding. Pt demo'd an exercise he has been doing at home of gripping and pulling a hankerchief.  OT educated pt on importance of using RUE during daily tasks. Pt verbalized understanding. Pt demo'd opening/closing a water bottle with R hand while L hand stabilized bottle. Pt reported using both hands to navigate iPad and for buttoning tasks at home.  Pt placed small pegs on pegboard and removed pegs with  tweezers - to improve FM coordination and dexterity, increase RUE functional use.  Self-Care Bathing ADL and A/E - to increase ind during bathing tasks, to improve RUE functional use - OT educated pt on using long-handled sponge to complete bathing tasks with increased ind. Pt verbalized preference for using washcloth, therefore OT educated pt on option of sewing or tying a washcloth around end of long-handled sponge. Pt verbalized understanding and demo'd understanding by trialing use of long-handled sponge to simulate washing back and lower extremities. Pt used long-handled sponge with both R and L hands.  Pt reported "[The shower chair] is just in my way" and therefore he completes showering tasks while standing up. OT educated pt on reducing fall risk in shower by using shower chair or bench and avoiding leaning against walls to keep center of gravity stable. Pt acknowledged understanding. Pt consistently reported preference for his spouse to assist with tasks at home, including bathing tasks. OT educated pt on OT role, goals for OT, increasing ind for functional tasks, and improving use of RUE for functional tasks. Pt acknowledged understanding.  PATIENT EDUCATION: Education details: see today's treatment above Person  educated: Patient and Spouse Education method: Explanation, Demonstration, and Handouts Education comprehension: verbalized understanding, returned demonstration, verbal cues required, tactile cues required, and needs further education  HOME EXERCISE PROGRAM: 03/26/2023: red putty and coordination HEPs 04/02/23 - sensory safety considerations (handout provided, see pt instructions)  GOALS:  SHORT TERM GOALS: Target date: 04/19/2023    Patient will demonstrate independence with initial RUE HEP. Baseline: Goal status: INITIAL  2.  Pt will report no more than moderate difficulty washing back with RUE using adaptive strategies or AD as needed.  Baseline: severe difficulty Goal status: INITIAL  3.  Pt will report RUE as primary hand for completion of at least 3 daily activities. Baseline: only uses R hand with TV remote Goal status: INITIAL  4.  Pt will independently recall the 5 main sensory precautions (cold, heat, sharp, chemical, and heavy) as needed to prevent injury/harm secondary to impairments.   Baseline:  Goal status: INITIAL   LONG TERM GOALS: Target date: 05/03/2023  Patient will demonstrate updated RUE HEP with 25% verbal cues or less for proper execution. Baseline:  Goal status: INITIAL  2.  Pt will report use of RUE >50% of time vs non-dominant LUE. Baseline: less than 50% of the time Goal status: INITIAL  3.  Patient will demonstrate at least 30 lbs R grip strength as needed to open jars and other containers. Baseline: 19.4 lbs 04/04/23 - 44.3 lbs Goal status: 04/04/23 - MET  4.  Pt will be able to open a tight or new jar with no more than moderate difficulty reported.  Baseline: unable Goal status: INITIAL  ASSESSMENT:  CLINICAL IMPRESSION: Pt tolerated treatment well and met 1 LTG for grip strength today. Pt reported and demo'd more frequent use of R hand for functional tasks. Pt would continue to benefit from education to improve safety, reduce fall  risk, and increase ind for functional tasks. Pt would benefit from skilled OT services to address deficits, increase overall independence, and return patient to PLOF as able.    PERFORMANCE DEFICITS: in functional skills including ADLs, IADLs, coordination, sensation, strength, Fine motor control, and UE functional use.   IMPAIRMENTS: are limiting patient from ADLs, IADLs, and leisure.   CO-MORBIDITIES: may have co-morbidities  that affects occupational performance. Patient will benefit from skilled OT to address above impairments and improve overall function.  REHAB POTENTIAL: Fair given chronicity of symptoms  PLAN:  OT FREQUENCY: 2x/week  OT DURATION: 6 weeks  PLANNED INTERVENTIONS: 97168 OT Re-evaluation, 97535 self care/ADL training, 44010 therapeutic exercise, 97530 therapeutic activity, 97112 neuromuscular re-education, 97140 manual therapy, 97035 ultrasound, 97018 paraffin, 27253 fluidotherapy, 97010 moist heat, 97032 electrical stimulation (manual), 97760 Orthotics management and training, 66440 Splinting (initial encounter), [phone] Subsequent splinting/medication, functional mobility training, energy conservation, coping strategies training, patient/family education, and DME and/or AE instructions  RECOMMENDED OTHER SERVICES: none at this time  CONSULTED AND AGREED WITH PLAN OF CARE: Patient and family member/caregiver  PLAN FOR NEXT SESSION:  Review RUE red putty and coordination HEP PRN Review sensory precautions and stimulation PRN Bathing tasks - Did pt obtain a long-handled sponge? Is pt using shower chair to decrease fall risk?  Coordination tasks - continue UE strengthening tasks    Wynetta Emery, OT 04/04/2023, 4:49 PM

## 2023-04-08 ENCOUNTER — Ambulatory Visit
Admission: RE | Admit: 2023-04-08 | Discharge: 2023-04-08 | Disposition: A | Discharge status: Home / Self Care | Payer: No Typology Code available for payment source | Source: Ambulatory Visit | Attending: Adult Health

## 2023-04-08 DIAGNOSIS — G8190 Hemiplegia, unspecified affecting unspecified side: Secondary | ICD-10-CM | POA: Diagnosis not present

## 2023-04-08 DIAGNOSIS — I129 Hypertensive chronic kidney disease with stage 1 through stage 4 chronic kidney disease, or unspecified chronic kidney disease: Secondary | ICD-10-CM | POA: Diagnosis not present

## 2023-04-08 DIAGNOSIS — R269 Unspecified abnormalities of gait and mobility: Secondary | ICD-10-CM | POA: Diagnosis not present

## 2023-04-08 DIAGNOSIS — E1122 Type 2 diabetes mellitus with diabetic chronic kidney disease: Secondary | ICD-10-CM | POA: Diagnosis not present

## 2023-04-08 DIAGNOSIS — I639 Cerebral infarction, unspecified: Secondary | ICD-10-CM | POA: Diagnosis not present

## 2023-04-08 DIAGNOSIS — G8191 Hemiplegia, unspecified affecting right dominant side: Secondary | ICD-10-CM | POA: Diagnosis not present

## 2023-04-08 DIAGNOSIS — R519 Headache, unspecified: Secondary | ICD-10-CM

## 2023-04-08 DIAGNOSIS — N1831 Chronic kidney disease, stage 3a: Secondary | ICD-10-CM | POA: Diagnosis not present

## 2023-04-09 ENCOUNTER — Ambulatory Visit: Payer: Medicare PPO | Admitting: Physical Therapy

## 2023-04-09 ENCOUNTER — Ambulatory Visit: Payer: Medicare PPO | Admitting: Occupational Therapy

## 2023-04-09 NOTE — Therapy (Deleted)
OUTPATIENT OCCUPATIONAL THERAPY NEURO TREATMENT  Patient Name: Jeff [name] MRN: [id number] DOB:[birth year], 72 y.o., male Today's Date: 04/09/2023  PCP: Anson Fret, MD  REFERRING PROVIDER: Ihor Austin, NP  END OF SESSION:      Past Medical History:  Diagnosis Date   ADHD (attention deficit hyperactivity disorder)    Diabetes mellitus    Hyperlipidemia    Hypertension    Renal disorder    Seizures (HCC)    Stroke [facility])    Past Surgical History:  Procedure Laterality Date   IR ANGIO INTRA EXTRACRAN SEL COM CAROTID INNOMINATE BILAT MOD SED  01/03/2021   IR ANGIO VERTEBRAL SEL SUBCLAVIAN INNOMINATE UNI L MOD SED  01/03/2021   IR ANGIO VERTEBRAL SEL VERTEBRAL UNI R MOD SED  01/03/2021   IR RADIOLOGIST EVAL & MGMT  01/13/2021   IR US GUIDE VASC ACCESS RIGHT  01/03/2021   NECK SURGERY     SPINE SURGERY     Cervical spine x 2; Vear Clock; Critzer.   Patient Active Problem List   Diagnosis Date Noted   Benign essential hypertension 11/20/2022   Hypomagnesemia 11/20/2022   Falls 11/20/2022   Obstructive sleep apnea (adult) (pediatric) 11/20/2022   Syncope and collapse 11/20/2022   Uncontrolled type 2 diabetes mellitus with hypoglycemia, with long-term current use of insulin (HCC) 11/20/2022   Acute encephalopathy 11/20/2022   Hypoglycemia 11/20/2022   TIA (transient ischemic attack) 03/10/2020   CKD (chronic kidney disease) 08/28/2018   Retinopathy of both eyes 08/28/2018   Stroke due to embolism of left vertebral artery (HCC) 08/12/2018   Stroke (HCC) 08/08/2018   Diabetes mellitus (HCC) 07/25/2015   Neck injury 06/22/2015   Hearing loss in right ear 10/14/2011   Agent orange exposure 10/14/2011   ADD (attention deficit disorder) 10/14/2011   Hyperlipidemia 10/14/2011   BMI 28.0-28.9,adult 10/14/2011    ONSET DATE: 03/04/2023 (date of referral)  REFERRING DIAG: G81.91 (ICD-10-CM) - Right hemiparesis (HCC) R26.9 (ICD-10-CM) - Gait abnormality  THERAPY DIAG:  No  diagnosis found.  Rationale for Evaluation and Treatment: Rehabilitation  SUBJECTIVE:   SUBJECTIVE STATEMENT: Pt reports pain/fatigue after PT session today. Pt reports no recent falls. Pt reports not using theraputty at home. Pt is using clothespins at home and "some finger exercises." Pt reports not completing coordination tasks except for stacking pennies and placing pennies in container.   Pt accompanied by: significant other - [name]  PERTINENT HISTORY: PMH: hypertension, hyperlipidemia, diabetes mellitus, TIA, previous stroke (March 2020), h/o cervical fusion C5-7 (2012), h/o neck injury 2016, stage 3 CKD, seizures   Right sided weakness - decreased right hand dexterity, c/o numbness  PRECAUTIONS: Fall and Other: seizure  WEIGHT BEARING RESTRICTIONS: No  PAIN:  Are you having pain? Yes: NPRS scale: 3/10 Pain location: "in my legs" Pain description: aching Aggravating factors: exercises Relieving factors: rest  FALLS: Has patient fallen in last 6 months? Yes. Number of falls "many" ~6 . Pt reported recent fall on 03/30/23 attempting to don pants while seated and slipped off side of bed.  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  HAND DOMINANCE: Right  ADLs: Overall ADLs: mod I Transfers/ambulation related to ADLs: UB Dressing: difficulty with buttons on sleeves and cufflinks LB  Dressing: assistance with belt management to reach behind back  IADLs:  Community mobility: dependent Medication management: min A  Handwriting:  doesn't write a lot  MOBILITY STATUS: Independent  ACTIVITY TOLERANCE: Activity tolerance: Good to fair  FUNCTIONAL OUTCOME MEASURES: Quick Dash: 29.5 % disability with use of R hand   UPPER EXTREMITY ROM and MMT:    BUE - WFL  HAND FUNCTION: Grip strength: Right: 19.4 lbs; Left: 69.6 lbs  COORDINATION: 03/26/2023: 9 -hole peg Right 37; Left 32  SENSATION: Reports severe paresthesias in R hand  EDEMA: none reported or observed  MUSCLE TONE: WFL  COGNITION: Overall cognitive status: Within functional limits for tasks assessed  PERCEPTION: Not tested  PRAXIS: Not tested  OBSERVATIONS: Pt appears well-kept. He ambulates without use of AD. No LOB.   TODAY'S TREATMENT:                                                                                                                               Therapeutic Activities OT reviewed sensory precautions with pt. Pt named 3 out of 5 sensory precautions ind (hot, cold, sharp) and required v/c for remaining (heavy, breakable, chemical).  Pt stated he had improved grip strength, therefore grip strength assessed and LTG updated. Pt met LTG grip strength today.  Pt reported not consistently completing theraputty and coordination HEP. OT reviewed RUE theraputty and coordination HEP with pt. Pt verbalized understanding for coordination and demo'd understanding of each theraputty exercise (5 reps each) with mod v/c. OT educated on importance of completing tasks to improve RUE grip strength and coordination. Pt verbalized understanding. Pt demo'd an exercise he has been doing at home of gripping and pulling a hankerchief.  OT educated pt on importance of using RUE during daily tasks. Pt verbalized understanding. Pt demo'd opening/closing a water bottle with R hand while L hand stabilized  bottle. Pt reported using both hands to navigate iPad and for buttoning tasks at home.  Pt placed small pegs on pegboard and removed pegs with tweezers - to improve FM coordination and dexterity, increase RUE functional use.  Self-Care Bathing ADL and A/E - to increase ind during bathing tasks, to improve RUE functional use - OT educated pt on using long-handled sponge to complete bathing tasks with increased ind. Pt verbalized preference for using washcloth, therefore OT educated pt on option of sewing or tying a washcloth around end of long-handled sponge. Pt verbalized understanding and demo'd understanding by trialing use of long-handled sponge to simulate washing back and lower extremities. Pt used long-handled sponge with both R and L hands.  Pt reported "[The shower chair] is just in my way" and therefore he completes showering tasks while standing up.  OT educated pt on reducing fall risk in shower by using shower chair or bench and avoiding leaning against walls to keep center of gravity stable. Pt acknowledged understanding. Pt consistently reported preference for his spouse to assist with tasks at home, including bathing tasks. OT educated pt on OT role, goals for OT, increasing ind for functional tasks, and improving use of RUE for functional tasks. Pt acknowledged understanding.  PATIENT EDUCATION: Education details: see today's treatment above Person educated: Patient and Spouse Education method: Explanation, Demonstration, and Handouts Education comprehension: verbalized understanding, returned demonstration, verbal cues required, tactile cues required, and needs further education  HOME EXERCISE PROGRAM: 03/26/2023: red putty and coordination HEPs 04/02/23 - sensory safety considerations (handout provided, see pt instructions)  GOALS:  SHORT TERM GOALS: Target date: 04/19/2023    Patient will demonstrate independence with initial RUE HEP. Baseline: Goal status: INITIAL  2.   Pt will report no more than moderate difficulty washing back with RUE using adaptive strategies or AD as needed.  Baseline: severe difficulty Goal status: INITIAL  3.  Pt will report RUE as primary hand for completion of at least 3 daily activities. Baseline: only uses R hand with TV remote Goal status: INITIAL  4.  Pt will independently recall the 5 main sensory precautions (cold, heat, sharp, chemical, and heavy) as needed to prevent injury/harm secondary to impairments.   Baseline:  Goal status: INITIAL   LONG TERM GOALS: Target date: 05/03/2023  Patient will demonstrate updated RUE HEP with 25% verbal cues or less for proper execution. Baseline:  Goal status: INITIAL  2.  Pt will report use of RUE >50% of time vs non-dominant LUE. Baseline: less than 50% of the time Goal status: INITIAL  3.  Patient will demonstrate at least 30 lbs R grip strength as needed to open jars and other containers. Baseline: 19.4 lbs 04/04/23 - 44.3 lbs Goal status: 04/04/23 - MET  4.  Pt will be able to open a tight or new jar with no more than moderate difficulty reported.  Baseline: unable Goal status: INITIAL  ASSESSMENT:  CLINICAL IMPRESSION: Pt tolerated treatment well and met 1 LTG for grip strength today. Pt reported and demo'd more frequent use of R hand for functional tasks. Pt would continue to benefit from education to improve safety, reduce fall risk, and increase ind for functional tasks. Pt would benefit from skilled OT services to address deficits, increase overall independence, and return patient to PLOF as able.    PERFORMANCE DEFICITS: in functional skills including ADLs, IADLs, coordination, sensation, strength, Fine motor control, and UE functional use.   IMPAIRMENTS: are limiting patient from ADLs, IADLs, and leisure.   CO-MORBIDITIES: may have co-morbidities  that affects occupational performance. Patient will benefit from skilled OT to address above impairments and  improve overall function.  REHAB POTENTIAL: Fair given chronicity of symptoms  PLAN:  OT FREQUENCY: 2x/week  OT DURATION: 6 weeks  PLANNED INTERVENTIONS: 97168 OT Re-evaluation, 97535 self care/ADL training, 82956 therapeutic exercise, 97530 therapeutic activity, 97112 neuromuscular re-education, 97140 manual therapy, 97035 ultrasound, 97018 paraffin, 21308 fluidotherapy, 97010 moist heat, 97032 electrical stimulation (manual), 97760 Orthotics management and training, 65784 Splinting (initial encounter), [phone] Subsequent splinting/medication, functional mobility training, energy conservation, coping strategies training, patient/family education, and DME and/or AE instructions  RECOMMENDED OTHER SERVICES: none at this time  CONSULTED AND AGREED WITH PLAN OF CARE: Patient and family member/caregiver  PLAN FOR NEXT SESSION:  Review RUE red putty and coordination HEP PRN Review sensory  precautions and stimulation PRN Bathing tasks - Did pt obtain a long-handled sponge? Is pt using shower chair to decrease fall risk?  Coordination tasks - continue UE strengthening tasks    Delana Meyer, OT 04/09/2023, 9:35 AM

## 2023-04-11 ENCOUNTER — Ambulatory Visit: Payer: Medicare PPO | Admitting: Occupational Therapy

## 2023-04-11 ENCOUNTER — Ambulatory Visit: Payer: Medicare PPO | Attending: Otolaryngology | Admitting: Physical Therapy

## 2023-04-11 DIAGNOSIS — M6281 Muscle weakness (generalized): Secondary | ICD-10-CM | POA: Diagnosis not present

## 2023-04-11 DIAGNOSIS — R293 Abnormal posture: Secondary | ICD-10-CM | POA: Insufficient documentation

## 2023-04-11 DIAGNOSIS — R2689 Other abnormalities of gait and mobility: Secondary | ICD-10-CM | POA: Diagnosis not present

## 2023-04-11 DIAGNOSIS — I69351 Hemiplegia and hemiparesis following cerebral infarction affecting right dominant side: Secondary | ICD-10-CM

## 2023-04-11 DIAGNOSIS — R278 Other lack of coordination: Secondary | ICD-10-CM | POA: Diagnosis not present

## 2023-04-11 DIAGNOSIS — R208 Other disturbances of skin sensation: Secondary | ICD-10-CM | POA: Insufficient documentation

## 2023-04-11 DIAGNOSIS — R29818 Other symptoms and signs involving the nervous system: Secondary | ICD-10-CM | POA: Insufficient documentation

## 2023-04-11 DIAGNOSIS — R2681 Unsteadiness on feet: Secondary | ICD-10-CM | POA: Diagnosis not present

## 2023-04-11 NOTE — Therapy (Signed)
OUTPATIENT OCCUPATIONAL THERAPY NEURO TREATMENT  Patient Name: Jeff [name] MRN: [id number] DOB:24-Oct-1950, 72 y.o., male Today's Date: 04/11/2023  PCP: Anson Fret, MD  REFERRING PROVIDER: Ihor Austin, NP  END OF SESSION:  OT End of Session - 04/11/23 1437     Visit Number 5    Number of Visits 13    Date for OT Re-Evaluation 05/03/23    Authorization Type [employer] - requires auth    Progress Note Due on Visit 10    OT Start Time 1401    OT Stop Time 1435    OT Time Calculation (min) 34 min    Activity Tolerance Patient tolerated treatment well    Behavior During Therapy WFL for tasks assessed/performed                Past Medical History:  Diagnosis Date   ADHD (attention deficit hyperactivity disorder)    Diabetes mellitus    Hyperlipidemia    Hypertension    Renal disorder    Seizures (HCC)    Stroke [facility])    Past Surgical History:  Procedure Laterality Date   IR ANGIO INTRA EXTRACRAN SEL COM CAROTID INNOMINATE BILAT MOD SED  01/03/2021   IR ANGIO VERTEBRAL SEL SUBCLAVIAN INNOMINATE UNI L MOD SED  01/03/2021   IR ANGIO VERTEBRAL SEL VERTEBRAL UNI R MOD SED  01/03/2021   IR RADIOLOGIST EVAL & MGMT  01/13/2021   IR US GUIDE VASC ACCESS RIGHT  01/03/2021   NECK SURGERY     SPINE SURGERY     Cervical spine x 2; Vear Clock; Critzer.   Patient Active Problem List   Diagnosis Date Noted   Benign essential hypertension 11/20/2022   Hypomagnesemia 11/20/2022   Falls 11/20/2022   Obstructive sleep apnea (adult) (pediatric) 11/20/2022   Syncope and collapse 11/20/2022   Uncontrolled type 2 diabetes mellitus with hypoglycemia, with long-term current use of insulin (HCC) 11/20/2022   Acute encephalopathy 11/20/2022   Hypoglycemia 11/20/2022   TIA (transient ischemic attack) 03/10/2020   CKD (chronic kidney disease) 08/28/2018   Retinopathy of both eyes 08/28/2018   Stroke due to embolism of left vertebral artery (HCC) 08/12/2018   Stroke (HCC) 08/08/2018    Diabetes mellitus (HCC) 07/25/2015   Neck injury 06/22/2015   Hearing loss in right ear 10/14/2011   Agent orange exposure 10/14/2011   ADD (attention deficit disorder) 10/14/2011   Hyperlipidemia 10/14/2011   BMI 28.0-28.9,adult 10/14/2011    ONSET DATE: 03/04/2023 (date of referral)  REFERRING DIAG: G81.91 (ICD-10-CM) - Right hemiparesis (HCC) R26.9 (ICD-10-CM) - Gait abnormality  THERAPY DIAG:  Muscle weakness (generalized)  Other lack of coordination  Hemiplegia and hemiparesis following cerebral infarction affecting right dominant side (HCC)  Rationale for Evaluation and Treatment: Rehabilitation  SUBJECTIVE:   SUBJECTIVE STATEMENT: Pt reported recent loss of a close friend. Pt reported blood sugar over 400 mg/dL on Tuesday and therefore appointments were cancelled earlier in the week. Pt's blood sugar today is 193 mg/dL per pt's personal glucose monitor. Pt reported seizure at church on Sunday which lasted approx. 5 minutes though pt feels okay since then.  Pt reports not using shower chair and not using long-handled sponge. Pt reports having bath mats.  Pt reports standing in corner in shower and using grab bars to improve balance during balance exercises assigned by PT.  Pt reported increased strength of RUE. Pt's spouse reported pt rarely uses theraputty for exercises though completes other R hand strengthening exercises (e.g. clothes pins).  Pt  accompanied by: significant other - [name]  PERTINENT HISTORY: PMH: hypertension, hyperlipidemia, diabetes mellitus, TIA, previous stroke (March 2020), h/o cervical fusion C5-7 (2012), h/o neck injury 2016, stage 3 CKD, seizures   Right sided weakness - decreased right hand dexterity, c/o numbness  PRECAUTIONS: Fall and Other: seizure  WEIGHT BEARING RESTRICTIONS: No  PAIN:  Are you having pain? No  FALLS: Has patient fallen in last 6 months? Yes. Number of falls "many" ~6 . Pt reported recent fall on 03/30/23  attempting to don pants while seated and slipped off side of bed.  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  HAND DOMINANCE: Right  ADLs: Overall ADLs: mod I Transfers/ambulation related to ADLs: UB Dressing: difficulty with buttons on sleeves and cufflinks LB Dressing: assistance with belt management to reach behind back  IADLs:  Community mobility: dependent Medication management: min A  Handwriting:  doesn't write a lot  MOBILITY STATUS: Independent  ACTIVITY TOLERANCE: Activity tolerance: Good to fair  FUNCTIONAL OUTCOME MEASURES: Quick Dash: 29.5 % disability with use of R hand   UPPER EXTREMITY ROM and MMT:    BUE - WFL  HAND FUNCTION: Grip strength: Right: 19.4 lbs; Left: 69.6 lbs  COORDINATION: 03/26/2023: 9 -hole peg Right 37; Left 32  SENSATION: Reports severe paresthesias in R hand  EDEMA: none reported or observed  MUSCLE TONE: WFL  COGNITION: Overall cognitive status: Within functional limits for tasks assessed  PERCEPTION: Not tested  PRAXIS: Not tested  OBSERVATIONS: Pt appears well-kept. He ambulates without use of AD. No LOB.   TODAY'S TREATMENT:                                                                                                                               Therapeutic Activities OT educated pt on shower safety including shower chair and  reiterated using long-handled sponge to increase ind. Pt acknowledged understanding though appeared resistant to use long-handled sponge or shower chair.  Pt reported continued numbness of R hand. Therefore, OT reviewed sensory precautions with pt. Pt verbalized 3 out of 5 safety precautions. Pt identified remaining 2 out of 5 with min v/c (heavy, breakable). Goal updated below.  TherEx Flex  bar (red then decreased to tan flex bar d/t pt c/o pain at B hands after first 'twist' set using red flex bar) - to increase RUE strengthening, coordination - 2 sets, x10 reps each of pronation, supination, twist -Pt  benefited from v/c to keep elbow tucked in at sides.  OT initiated red theraband HEP to increase RUE strengthening, coordination, proprioception, sequencing of unfamiliar motor movements: Handout provided. Access Code: [email or web address] URL: [email or web address] Date: 04/11/2023 Prepared by: Carilyn Goodpasture  Exercises - Seated Elbow Flexion with Self-Anchored Resistance  - 5 reps  - Seated Bilateral Shoulder External Rotation with Resistance  - 5 reps - Pt benefited from v/c to keep elbows tucked in at sides.  - Seated Shoulder Flexion with Self-Anchored Resistance and seated shoulder horizontal ABD with resistance  - After 1 rep of each exercise, pt c/o pain at R shoulder d/t previous injury. Therefore, shoulder exercises terminated to prevent increase in pain.   PATIENT EDUCATION: Education details: see today's treatment above Person educated: Patient and Spouse Education method: Explanation, Demonstration, and Handouts Education comprehension: verbalized understanding, returned demonstration, verbal cues required, tactile cues required, and needs further education  HOME EXERCISE PROGRAM: 03/26/2023: red putty and coordination HEPs 04/02/23 - sensory safety considerations (handout provided, see pt instructions)  04/11/23 - Red Theraband HEP - Access Code: [email or web address] URL:  [email or web address] Date: 04/11/2023 Prepared by: Carilyn Goodpasture  Exercises - Seated Elbow Flexion with Self-Anchored Resistance  - 2 x daily - 2 sets - 10 reps - Seated Bilateral Shoulder External Rotation with Resistance  - 2 x daily - 2 sets - 10 reps  GOALS:  SHORT TERM GOALS: Target date: 04/19/2023    Patient will demonstrate independence with initial RUE HEP. Baseline: Goal status: INITIAL  2.  Pt will report no more than moderate difficulty washing back with RUE using adaptive strategies or AD as needed.  Baseline: severe difficulty Goal status: INITIAL  3.  Pt will report RUE as primary hand for completion of at least 3 daily activities. Baseline: only uses R hand with TV remote Goal status: INITIAL  4.  Pt will independently recall the 5 main sensory precautions (cold, heat, sharp, chemical, and heavy) as needed to prevent injury/harm secondary to impairments.   Baseline: new to outpt OT 04/11/23 - Pt verbalized  3 out of 5 precautions ind, and remaining 2 out of 5 with min v/c. Goal status: In progress   LONG TERM GOALS: Target date: 05/03/2023  Patient will demonstrate updated RUE HEP with 25% verbal cues or less for proper execution. Baseline:  Goal status: INITIAL  2.  Pt will report use of RUE >50% of time vs non-dominant LUE. Baseline: less than 50% of the time Goal status: INITIAL  3.  Patient will demonstrate at least 30 lbs R grip strength as needed to open jars and other containers. Baseline: 19.4 lbs 04/04/23 - 44.3 lbs Goal status: 04/04/23 - MET  4.  Pt will be able to open a tight or new jar with no more than moderate difficulty reported.  Baseline: unable Goal status: INITIAL  ASSESSMENT:  CLINICAL IMPRESSION: Pt tolerated tasks well with RUE shoulder strengthening limited by pain secondary to previous shoulder injury. Pt would benefit from continuing education for safety during ADL/IADL tasks. Pt would benefit from skilled OT  services to address deficits, increase overall independence, and return patient to PLOF as able.    PERFORMANCE DEFICITS: in functional skills including ADLs, IADLs, coordination, sensation, strength, Fine motor control, and UE functional use.   IMPAIRMENTS: are limiting patient from ADLs, IADLs, and leisure.   CO-MORBIDITIES: may have co-morbidities  that affects occupational performance. Patient will benefit from skilled OT to address above impairments and improve overall function.  REHAB POTENTIAL:  Fair given chronicity of symptoms  PLAN:  OT FREQUENCY: 2x/week  OT DURATION: 6 weeks  PLANNED INTERVENTIONS: 97168 OT Re-evaluation, 97535 self care/ADL training, 97110 therapeutic exercise, 97530 therapeutic activity, 97112 neuromuscular re-education, 97140 manual therapy, 97035 ultrasound, 97018 paraffin, 97039 fluidotherapy, 97010 moist heat, 97032 electrical stimulation (manual), 97760 Orthotics management and training, 97760 Splinting (initial encounter), [id number] Subsequent splinting/medication, functional mobility training, energy conservation, coping strategies training, patient/family education, and DME and/or AE instructions  RECOMMENDED OTHER SERVICES: none at this time  CONSULTED AND AGREED WITH PLAN OF CARE: Patient and family member/caregiver  PLAN FOR NEXT SESSION:  Review RUE red putty and coordination HEP PRN Review sensory precautions and stimulation PRN Bathing tasks - reiterate safety and long-handled sponge PRN  Assess progress towards STG  Coordination tasks - nuts/bolts, Blaze Pods UE strengthening tasks - progress theraband PRN, grip strengthening with grip and large pegs    Wynetta Emery, OT 04/11/2023, 2:44 PM

## 2023-04-11 NOTE — Therapy (Signed)
OUTPATIENT PHYSICAL THERAPY NEURO TREATMENT   Patient Name: Jeff [name] MRN: [id number] DOB:[birth year], 72 y.o., male Today's Date: 04/11/2023   PCP: Anson Fret, MD  REFERRING PROVIDER: Ihor Austin, NP  END OF SESSION:  PT End of Session - 04/11/23 1318     Visit Number 4    Number of Visits 13   plus eval   Date for PT Re-Evaluation 05/17/23   to allow for scheduling delays   Authorization Type [employer]IC[employer] CHOICE PPO    Authorization Time Period 10-17 - 05-17-23    Authorization - Number of Visits 13    PT Start Time 1318    PT Stop Time 1401    PT Time Calculation (min) 43 min    Activity Tolerance Patient tolerated treatment well    Behavior During Therapy WFL for tasks assessed/performed                Past Medical History:  Diagnosis Date   ADHD (attention deficit hyperactivity disorder)    Diabetes mellitus    Hyperlipidemia    Hypertension    Renal disorder    Seizures (HCC)    Stroke [facility])    Past Surgical History:  Procedure Laterality Date   IR ANGIO INTRA EXTRACRAN SEL COM CAROTID INNOMINATE BILAT MOD SED  01/03/2021   IR ANGIO VERTEBRAL SEL SUBCLAVIAN INNOMINATE UNI L MOD SED  01/03/2021   IR ANGIO VERTEBRAL SEL VERTEBRAL UNI R MOD SED  01/03/2021   IR RADIOLOGIST EVAL & MGMT  01/13/2021   IR US GUIDE VASC ACCESS RIGHT  01/03/2021   NECK SURGERY     SPINE SURGERY     Cervical spine x 2; Vear Clock; Critzer.   Patient Active Problem List   Diagnosis Date Noted   Benign essential hypertension 11/20/2022   Hypomagnesemia 11/20/2022   Falls 11/20/2022   Obstructive sleep apnea (adult) (pediatric) 11/20/2022   Syncope and collapse 11/20/2022   Uncontrolled type 2 diabetes mellitus with hypoglycemia, with long-term current use of insulin (HCC) 11/20/2022   Acute encephalopathy 11/20/2022   Hypoglycemia 11/20/2022   TIA (transient ischemic attack) 03/10/2020   CKD (chronic kidney disease) 08/28/2018   Retinopathy of both eyes 08/28/2018    Stroke due to embolism of left vertebral artery (HCC) 08/12/2018   Stroke (HCC) 08/08/2018   Diabetes mellitus (HCC) 07/25/2015   Neck injury 06/22/2015   Hearing loss in right ear 10/14/2011   Agent orange exposure 10/14/2011   ADD (attention deficit disorder) 10/14/2011   Hyperlipidemia 10/14/2011   BMI 28.0-28.9,adult 10/14/2011    ONSET DATE: 03/04/2023 (referral date)  REFERRING DIAG: G81.91 (ICD-10-CM) - Right hemiparesis (HCC)  THERAPY DIAG:  Muscle weakness (generalized)  Hemiplegia and hemiparesis following cerebral infarction affecting right dominant side (HCC)  Unsteadiness on feet  Abnormal posture  Other abnormalities of gait and mobility  Rationale for Evaluation and Treatment: Rehabilitation  SUBJECTIVE:  SUBJECTIVE STATEMENT:   Pt called his Dr. the other day and they put him back on Jardiance, sugar was >400 on Tuesday. Reports no falls since last visit. Had a seizure on Sunday at church "I guess Jesus was trying to get my attention", his blood sugar was high; this seizure was similar in appearance and duration to prior seizures. Did not go to hospital for the seizure, would have if he had a second one. Did eat lunch before coming today. Regarding HEP: "I've been working on therapy, it's been going good"  Pt accompanied by: family member: wife, [name]  PERTINENT HISTORY: hx of left cerebellum stroke (March 2020) with residual imbalance/gait impairment and right-sided ataxia, TIA (July 2022), transient AMS possibly in setting of hypoglycemia, left-sided headaches, hypertension, hyperlipidemia, diabetes mellitus, h/o cervical fusion C5-7 (2012), h/o neck injury 2016, stage 3 CKD, and intracranial stenosis   PAIN:  Are you having pain? No  PRECAUTIONS: Fall  RED FLAGS: Bowel  or bladder incontinence: No   WEIGHT BEARING RESTRICTIONS: No  FALLS: Has patient fallen in last 6 months? Yes. Number of falls "Too many to remember": "in the last 2 weeks I fell once", has not gone to hospital for any of these falls, has hit head with one- "fell out of bed and hit head on nightstand" needed help from wife to get away from the bed but stood on own. Had fall in bathroom. Not typically falling when walking, but getting up from sitting. No reports of dizziness or lightheadedness.   LIVING ENVIRONMENT: Lives with: lives with their spouse Lives in: House/apartment Stairs: Yes: External: 1 to porch, 1 to foyer steps; none can use ramp at back of house Has following equipment at home:  "I've got it all"  PLOF: Needs assistance with ADLs- some dressing like little buttons on shirt sleeves or cufflinks, has not driven in over 2 years  PATIENT GOALS: "walk good", "get back to as normal as I can"  OBJECTIVE:  Note: Objective measures were completed at Evaluation unless otherwise noted.  DIAGNOSTIC FINDINGS:   MRI HEAD WITHOUT AND WITH CONTRAST (11/21/2022) IMPRESSION: 1. No acute intracranial abnormality or mass. No specific findings to explain seizures. 2. Unchanged moderate chronic small-vessel disease and central pattern of volume loss.  04/08/2023  5:20 PM repeat MRI WO Contrast   SENSATION: R hand "has gone to sleep", feels like this all the time, pt feels that it is colder than the L , impaired sensation on the top of both feet  EDEMA:  "Feet will swell every once in a while"  LOWER EXTREMITY MMT:    MMT Right Eval Left Eval  Hip flexion 3 4-  Hip extension    Hip abduction    Hip adduction    Hip internal rotation    Hip external rotation    Knee flexion 3 5  Knee extension 3 5  Ankle dorsiflexion 3+ 5  Ankle plantarflexion    Ankle inversion    Ankle eversion    (Blank rows = not tested)   STAIRS: Level of Assistance: CGA and Min A Stair  Negotiation Technique: Step to Pattern with Bilateral Rails Number of Stairs: 4  Height of Stairs: standard  Comments: pt w/ increased difficulty descending requiring Min A for balance "I'm not sure I could have done that without your help"  GAIT: Gait pattern: step through pattern, decreased arm swing- Right, decreased arm swing- Left, decreased step length- Right, decreased step length- Left, decreased hip/knee flexion- Right,  decreased ankle dorsiflexion- Right, decreased ankle dorsiflexion- Left, trunk flexed, poor foot clearance- Right, and poor foot clearance- Left Distance walked: various clinical distances Assistive device utilized: None Level of assistance: SBA Comments: consider assessing w/ ADs   TODAY'S TREATMENT:                                                                                                                              TherAct Blood Sugars: 1:22 pm Blood Sugar: 196 1:51 pm Blood Sugar: 189  Short Term Goal Assessment:  [facility] PT Assessment - 04/11/23 1349       Ambulation/Gait   Gait velocity 32.8 ft over 15.6 sec      Functional Gait  Assessment   Gait assessed  Yes    Gait Level Surface Walks 20 ft, slow speed, abnormal gait pattern, evidence for imbalance or deviates 10-15 in outside of the 12 in walkway width. Requires more than 7 sec to ambulate 20 ft.    Change in Gait Speed Makes only minor adjustments to walking speed, or accomplishes a change in speed with significant gait deviations, deviates 10-15 in outside the 12 in walkway width, or changes speed but loses balance but is able to recover and continue walking.    Gait with Horizontal Head Turns Performs head turns smoothly with slight change in gait velocity (eg, minor disruption to smooth gait path), deviates 6-10 in outside 12 in walkway width, or uses an assistive device.    Gait with Vertical Head Turns Performs task with slight change in gait velocity (eg, minor disruption to smooth gait  path), deviates 6 - 10 in outside 12 in walkway width or uses assistive device    Gait and Pivot Turn Pivot turns safely in greater than 3 sec and stops with no loss of balance, or pivot turns safely within 3 sec and stops with mild imbalance, requires small steps to catch balance.    Step Over Obstacle Is able to step over one shoe box (4.5 in total height) but must slow down and adjust steps to clear box safely. May require verbal cueing.    Gait with Narrow Base of Support Ambulates less than 4 steps heel to toe or cannot perform without assistance.    Gait with Eyes Closed Cannot walk 20 ft without assistance, severe gait deviations or imbalance, deviates greater than 15 in outside 12 in walkway width or will not attempt task.    Ambulating Backwards Walks 20 ft, slow speed, abnormal gait pattern, evidence for imbalance, deviates 10-15 in outside 12 in walkway width.    Steps Alternating feet, must use rail.    Total Score 12    FGA comment: high fall risk              PATIENT EDUCATION:  Education details: POC, continue HEP, outcome measures, STG results Person educated: Patient and Spouse Education method: Explanation Education comprehension: verbalized understanding and needs further education  HOME  EXERCISE PROGRAM:  To be initiated Access Code: [email or web address] URL: [email or web address] Date: 03/29/2023 Prepared by: Beverely Low  Exercises - Standing Balance in Corner  - 1 x daily - 7 x weekly - 3 sets - 10 reps - Standing Balance in Corner with Eyes Closed  - 1 x daily - 7 x weekly - 3 sets - 10 reps  GOALS: Goals reviewed with patient? No  SHORT TERM GOALS: Target date: 04/11/2023   Pt will improve FGA to at least 10/30 (MCID 4, Fall risk <22) for improved dynamic balance and safety.   Baseline: 6/30 (10/17), 12/30 (04/11/23) Goal status: MET  2.  Pt will be independent with initial HEP for improved strength, balance, transfers and gait.  Baseline:  Goal  status: MET  3.  Pt will improve gait velocity to at least 2.50 ft/sec for improved gait efficiency and safety in the home Baseline: 1.93 ft/sec (03/21/23), 2.10 ft/sec (04/11/23) Goal status: PROGRESSING  4.  Pt will hold romberg stance for 30 seconds with eyes open on solid ground with no LOB and SBA. Baseline: could not perform, met (04/11/23) Goal status: MET   LONG TERM GOALS: Target date: 05/02/2023  Pt will improve FGA to at least 14/30 (MCID 4, Fall risk <22) for improved dynamic balance and safety.   Baseline: 6/30 (03/21/23), 12/30 (04/11/23) Goal status: INITIAL  2.  Pt will be independent with final HEP for improved strength, balance, transfers and gait.  Baseline:  Goal status: INITIAL  3.  Pt will improve gait velocity to at least 2.50 ft/sec (norm) for improved gait efficiency and safety in the community  Baseline: 1.93 ft/sec (03/21/23), 2.10 ft/sec (04/11/23) Goal status: REVISED  4.  Pt will hold romberg stance for 30 seconds with eyes closed on solid ground with no LOB and SBA. Baseline: could hold eyes open (04/11/23) Goal status: INITIAL   ASSESSMENT:  CLINICAL IMPRESSION:  Emphasis of skilled PT session on assessment of STGs. This date, pt has met 3/4 STGs and is progressing on 1/4 STGs. Pt improved FGA from 6/30 to 12/30 this date, with notable improvements in gait with horizontal and vertical head turns, pivot turn, backwards ambulation, and stairs. Pt was able to maintain romberg stance on solid ground with SBA for 30 seconds with no LOB, demonstrating increased standing balance. Pt reports good adherence to initial HEP. Pt's gait speed improved to 2.10 ft/sec, but not meeting 2.50 ft/sec goal. Continue POC.   OBJECTIVE IMPAIRMENTS: Abnormal gait, decreased activity tolerance, decreased balance, decreased endurance, decreased knowledge of use of DME, decreased mobility, difficulty walking, decreased ROM, decreased strength, decreased safety awareness, dizziness,  hypomobility, impaired flexibility, and impaired sensation.   ACTIVITY LIMITATIONS: lifting, bending, squatting, stairs, transfers, bathing, dressing, and hygiene/grooming  PARTICIPATION LIMITATIONS: meal prep, cleaning, laundry, driving, shopping, community activity, occupation, and yard work  PERSONAL FACTORS: Age, Time since onset of injury/illness/exacerbation, and 3+ comorbidities:    hx left cerebellum stroke, TIA (July 2022), transient AMS possibly in setting of hypoglycemia, left-sided headaches, hypertension, hyperlipidemia, diabetes mellitus, h/o cervical fusion C5-7 (2012), h/o neck injury 2016, stage 3 CKD, and intracranial stenosis are also affecting patient's functional outcome.   REHAB POTENTIAL: Fair original stroke was in 2020, potentially had a new stroke, balance and gait have declined since last seen at this clinic  CLINICAL DECISION MAKING: Evolving/moderate complexity  EVALUATION COMPLEXITY: Moderate  PLAN:   PT FREQUENCY: 2x/week  PT DURATION: 6 weeks  PLANNED INTERVENTIONS: 97164- PT Re-evaluation, 97110-Therapeutic exercises, 97530- Therapeutic activity,  21308- Neuromuscular re-education, [phone]- Self Care, 69629- Manual therapy, [phone]- Gait training, [phone]- Canalith repositioning, 97014- Electrical stimulation (unattended), [phone]- Electrical stimulation (manual), Patient/Family education, Balance training, Stair training, Taping, Dry Needling, Joint mobilization, Joint manipulation, Spinal manipulation, Spinal mobilization, Vestibular training, DME instructions, Cryotherapy, and Moist heat  PLAN FOR NEXT SESSION: Review exercises for HEP for RLE & core strengthening, balance, gait training, endurance; assess if RW improves gait,   very unsafe w/ most dynamic gait challenges: guard closely, clamshells for R, posterior pelvic tilt+bridge, marching, bent knee fall outs, sit-to-stand variations, counter support- marches, extension, abduction, heel raises, standing balance  progress towards tandem stance, per pt "I need to build up my endurance" Hx of seizures, no blaze pods, ask about blood glucose (low)  Beverely Low, Student-PT 04/11/2023, 2:04 PM

## 2023-04-11 NOTE — Telephone Encounter (Signed)
Noted! Thank you

## 2023-04-11 NOTE — Telephone Encounter (Signed)
Called patient and informed him of his MRI Results. Pt verbalized understanding. Patient state he went to his kidney Dr. And that have put him back on Jardiance, pt wanted you to know FYI!

## 2023-04-11 NOTE — Telephone Encounter (Signed)
-----   Message from Ihor Austin sent at 04/10/2023  4:45 PM EST ----- Patient requested telephone call with results - please advise recent MRI brain did not show any new strokes or new concerning findings which is great news! Continue with current treatment plan at this time. Thank you.

## 2023-04-16 ENCOUNTER — Ambulatory Visit: Payer: Medicare PPO | Admitting: Physical Therapy

## 2023-04-16 ENCOUNTER — Ambulatory Visit: Payer: Medicare PPO | Admitting: Occupational Therapy

## 2023-04-16 ENCOUNTER — Encounter: Payer: Self-pay | Admitting: Physical Therapy

## 2023-04-16 VITALS — BP 97/63 | HR 100

## 2023-04-16 DIAGNOSIS — R278 Other lack of coordination: Secondary | ICD-10-CM

## 2023-04-16 DIAGNOSIS — R2689 Other abnormalities of gait and mobility: Secondary | ICD-10-CM | POA: Diagnosis not present

## 2023-04-16 DIAGNOSIS — R208 Other disturbances of skin sensation: Secondary | ICD-10-CM | POA: Diagnosis not present

## 2023-04-16 DIAGNOSIS — M6281 Muscle weakness (generalized): Secondary | ICD-10-CM

## 2023-04-16 DIAGNOSIS — I69351 Hemiplegia and hemiparesis following cerebral infarction affecting right dominant side: Secondary | ICD-10-CM | POA: Diagnosis not present

## 2023-04-16 DIAGNOSIS — R29818 Other symptoms and signs involving the nervous system: Secondary | ICD-10-CM | POA: Diagnosis not present

## 2023-04-16 DIAGNOSIS — R2681 Unsteadiness on feet: Secondary | ICD-10-CM

## 2023-04-16 DIAGNOSIS — R293 Abnormal posture: Secondary | ICD-10-CM | POA: Diagnosis not present

## 2023-04-16 NOTE — Therapy (Signed)
OUTPATIENT PHYSICAL THERAPY NEURO TREATMENT   Patient Name: Jeff [name] MRN: [id number] DOB:1951-03-22, 72 y.o., male Today's Date: 04/16/2023   PCP: Anson Fret, MD  REFERRING PROVIDER: Ihor Austin, NP  END OF SESSION:  PT End of Session - 04/16/23 1938     Visit Number 5    Number of Visits 13   plus eval   Date for PT Re-Evaluation 05/17/23   to allow for scheduling delays   Authorization Type [employer] CHOICE PPO    Authorization Time Period 10-17 - 05-17-23    Authorization - Visit Number 5    Authorization - Number of Visits 13    PT Start Time 1402    PT Stop Time 1442    PT Time Calculation (min) 40 min    Equipment Utilized During Treatment Gait belt    Activity Tolerance Patient tolerated treatment well;Patient limited by fatigue;Other (comment)   mild dyspnea experienced   Behavior During Therapy WFL for tasks assessed/performed                 Past Medical History:  Diagnosis Date   ADHD (attention deficit hyperactivity disorder)    Diabetes mellitus    Hyperlipidemia    Hypertension    Renal disorder    Seizures (HCC)    Stroke [facility])    Past Surgical History:  Procedure Laterality Date   IR ANGIO INTRA EXTRACRAN SEL COM CAROTID INNOMINATE BILAT MOD SED  01/03/2021   IR ANGIO VERTEBRAL SEL SUBCLAVIAN INNOMINATE UNI L MOD SED  01/03/2021   IR ANGIO VERTEBRAL SEL VERTEBRAL UNI R MOD SED  01/03/2021   IR RADIOLOGIST EVAL & MGMT  01/13/2021   IR US GUIDE VASC ACCESS RIGHT  01/03/2021   NECK SURGERY     SPINE SURGERY     Cervical spine x 2; Vear Clock; Critzer.   Patient Active Problem List   Diagnosis Date Noted   Benign essential hypertension 11/20/2022   Hypomagnesemia 11/20/2022   Falls 11/20/2022   Obstructive sleep apnea (adult) (pediatric) 11/20/2022   Syncope and collapse 11/20/2022   Uncontrolled type 2 diabetes mellitus with hypoglycemia, with long-term current use of insulin (HCC) 11/20/2022   Acute encephalopathy 11/20/2022    Hypoglycemia 11/20/2022   TIA (transient ischemic attack) 03/10/2020   CKD (chronic kidney disease) 08/28/2018   Retinopathy of both eyes 08/28/2018   Stroke due to embolism of left vertebral artery (HCC) 08/12/2018   Stroke (HCC) 08/08/2018   Diabetes mellitus (HCC) 07/25/2015   Neck injury 06/22/2015   Hearing loss in right ear 10/14/2011   Agent orange exposure 10/14/2011   ADD (attention deficit disorder) 10/14/2011   Hyperlipidemia 10/14/2011   BMI 28.0-28.9,adult 10/14/2011    ONSET DATE: 03/04/2023 (referral date)  REFERRING DIAG: G81.91 (ICD-10-CM) - Right hemiparesis (HCC)  THERAPY DIAG:  Muscle weakness (generalized)  Unsteadiness on feet  Rationale for Evaluation and Treatment: Rehabilitation  SUBJECTIVE:  SUBJECTIVE STATEMENT:   Pt reports he developed a headache on way to PT appt - pt was given Tylenol by his wife at start of session.  Pt states he has been doing exercises in his large bathroom at home because he has a bar in there that he can hold onto for safety.  Pt accompanied by: family member: wife, [name]  PERTINENT HISTORY: hx of left cerebellum stroke (March 2020) with residual imbalance/gait impairment and right-sided ataxia, TIA (July 2022), transient AMS possibly in setting of hypoglycemia, left-sided headaches, hypertension, hyperlipidemia, diabetes mellitus, h/o cervical fusion C5-7 (2012), h/o neck injury 2016, stage 3 CKD, and intracranial stenosis   PAIN:  Are you having pain? Yes - headache - moderate intensity at start of   PRECAUTIONS: Fall  RED FLAGS: Bowel or bladder incontinence: No   WEIGHT BEARING RESTRICTIONS: No  FALLS: Has patient fallen in last 6 months? Yes. Number of falls "Too many to remember": "in the last 2 weeks I fell once", has not  gone to hospital for any of these falls, has hit head with one- "fell out of bed and hit head on nightstand" needed help from wife to get away from the bed but stood on own. Had fall in bathroom. Not typically falling when walking, but getting up from sitting. No reports of dizziness or lightheadedness.   LIVING ENVIRONMENT: Lives with: lives with their spouse Lives in: House/apartment Stairs: Yes: External: 1 to porch, 1 to foyer steps; none can use ramp at back of house Has following equipment at home:  "I've got it all"  PLOF: Needs assistance with ADLs- some dressing like little buttons on shirt sleeves or cufflinks, has not driven in over 2 years  PATIENT GOALS: "walk good", "get back to as normal as I can"  OBJECTIVE:  Note: Objective measures were completed at Evaluation unless otherwise noted.  DIAGNOSTIC FINDINGS:   MRI HEAD WITHOUT AND WITH CONTRAST (11/21/2022) IMPRESSION: 1. No acute intracranial abnormality or mass. No specific findings to explain seizures. 2. Unchanged moderate chronic small-vessel disease and central pattern of volume loss.  04/08/2023  5:20 PM repeat MRI WO Contrast   SENSATION: R hand "has gone to sleep", feels like this all the time, pt feels that it is colder than the L , impaired sensation on the top of both feet  EDEMA:  "Feet will swell every once in a while"  LOWER EXTREMITY MMT:    MMT Right Eval Left Eval  Hip flexion 3 4-  Hip extension    Hip abduction    Hip adduction    Hip internal rotation    Hip external rotation    Knee flexion 3 5  Knee extension 3 5  Ankle dorsiflexion 3+ 5  Ankle plantarflexion    Ankle inversion    Ankle eversion    (Blank rows = not tested)   STAIRS: Level of Assistance: CGA and Min A Stair Negotiation Technique: Step to Pattern with Bilateral Rails Number of Stairs: 4  Height of Stairs: standard  Comments: pt w/ increased difficulty descending requiring Min A for balance "I'm not sure I  could have done that without your help"  GAIT: Gait pattern: step through pattern, decreased arm swing- Right, decreased arm swing- Left, decreased step length- Right, decreased step length- Left, decreased hip/knee flexion- Right, decreased ankle dorsiflexion- Right, decreased ankle dorsiflexion- Left, trunk flexed, poor foot clearance- Right, and poor foot clearance- Left Distance walked: various clinical distances Assistive device utilized: None Level of  assistance: SBA Comments: consider assessing w/ ADs   TODAY'S TREATMENT:                                                                                                                               Blood Sugar:  206 at start of session  Vitals:   04/16/23 1433  BP: 97/63  Pulse: 100   Pulse 102, O2 96% after approx. 15" activity in session - pt had mild dyspnea   TherEx: Sit to stand from mat 3 reps without UE support  Pt performed Rt hip flexion with 2# weight on leg for strengthening - 5 reps to 1st step, 10 reps to 2nd step and 5 reps to 3rd step with UE support on hand rails  Step up exercise - RLE - 10 reps without UE support with CGA  Pt performed stepping over/back of black balance beam 10 reps with RLE with 2# weight on leg; stepping laterally over balance beam with 2# weight on RLE 10 reps with CGA  NeuroRe-ed:    Pt performed cone taps to 3 cones - with CGA to min assist for improved SLS on each leg  Alternate tap ups to 1st step without UE support with CGA 10 reps each leg  Pt performed sit to stand/ step Rt and pivot turn to Lt side 3 reps;  then performed step with LLE/pivot turn to Rt side 3 reps to improve dynamic balance with turning;  CGA for safety  Pt performed kicking 3 bean bags to improve feedforward balance reactions and SLS on each leg - pt had no LOB with this activity   PATIENT EDUCATION:  Education details: POC, continue HEP, outcome measures, STG results Person educated: Patient and  Spouse Education method: Explanation Education comprehension: verbalized understanding and needs further education  HOME EXERCISE PROGRAM:  To be initiated Access Code: [email or web address] URL: [email or web address] Date: 03/29/2023 Prepared by: Beverely Low  Exercises - Standing Balance in Corner  - 1 x daily - 7 x weekly - 3 sets - 10 reps - Standing Balance in Corner with Eyes Closed  - 1 x daily - 7 x weekly - 3 sets - 10 reps  GOALS: Goals reviewed with patient? No  SHORT TERM GOALS: Target date: 04/11/2023   Pt will improve FGA to at least 10/30 (MCID 4, Fall risk <22) for improved dynamic balance and safety.   Baseline: 6/30 (10/17), 12/30 (04/11/23) Goal status: MET  2.  Pt will be independent with initial HEP for improved strength, balance, transfers and gait.  Baseline:  Goal status: MET  3.  Pt will improve gait velocity to at least 2.50 ft/sec for improved gait efficiency and safety in the home Baseline: 1.93 ft/sec (03/21/23), 2.10 ft/sec (04/11/23) Goal status: PROGRESSING  4.  Pt will hold romberg stance for 30 seconds with eyes open on solid ground with no LOB and SBA. Baseline: could not perform, met (04/11/23) Goal status: MET  LONG TERM GOALS: Target date: 05/02/2023  Pt will improve FGA to at least 14/30 (MCID 4, Fall risk <22) for improved dynamic balance and safety.   Baseline: 6/30 (03/21/23), 12/30 (04/11/23) Goal status: INITIAL  2.  Pt will be independent with final HEP for improved strength, balance, transfers and gait.  Baseline:  Goal status: INITIAL  3.  Pt will improve gait velocity to at least 2.50 ft/sec (norm) for improved gait efficiency and safety in the community  Baseline: 1.93 ft/sec (03/21/23), 2.10 ft/sec (04/11/23) Goal status: REVISED  4.  Pt will hold romberg stance for 30 seconds with eyes closed on solid ground with no LOB and SBA. Baseline: could hold eyes open (04/11/23) Goal status: INITIAL   ASSESSMENT:  CLINICAL  IMPRESSION:  PT session focused on RLE strengthening with 2# weight used for hip flexor strengthening and balance activities to improve SLS on each leg.  Pt's balance is improving but pt continues to have gait deviations including decreased Rt hip and knee flexion in swing phase of gait with lateral trunk lean.  Pt declined gait training (for 1 lap around track) due to having some mild dyspnea with activity during session.  Pt did report that headache had subsided at end of session.  Continue POC.   OBJECTIVE IMPAIRMENTS: Abnormal gait, decreased activity tolerance, decreased balance, decreased endurance, decreased knowledge of use of DME, decreased mobility, difficulty walking, decreased ROM, decreased strength, decreased safety awareness, dizziness, hypomobility, impaired flexibility, and impaired sensation.   ACTIVITY LIMITATIONS: lifting, bending, squatting, stairs, transfers, bathing, dressing, and hygiene/grooming  PARTICIPATION LIMITATIONS: meal prep, cleaning, laundry, driving, shopping, community activity, occupation, and yard work  PERSONAL FACTORS: Age, Time since onset of injury/illness/exacerbation, and 3+ comorbidities:    hx left cerebellum stroke, TIA (July 2022), transient AMS possibly in setting of hypoglycemia, left-sided headaches, hypertension, hyperlipidemia, diabetes mellitus, h/o cervical fusion C5-7 (2012), h/o neck injury 2016, stage 3 CKD, and intracranial stenosis are also affecting patient's functional outcome.   REHAB POTENTIAL: Fair original stroke was in 2020, potentially had a new stroke, balance and gait have declined since last seen at this clinic  CLINICAL DECISION MAKING: Evolving/moderate complexity  EVALUATION COMPLEXITY: Moderate  PLAN:   PT FREQUENCY: 2x/week  PT DURATION: 6 weeks  PLANNED INTERVENTIONS: 97164- PT Re-evaluation, 97110-Therapeutic exercises, 97530- Therapeutic activity, 97112- Neuromuscular re-education, 97535- Self Care, 16109- Manual  therapy, [phone]- Gait training, [phone]- Canalith repositioning, 97014- Electrical stimulation (unattended), [phone]- Electrical stimulation (manual), Patient/Family education, Balance training, Stair training, Taping, Dry Needling, Joint mobilization, Joint manipulation, Spinal manipulation, Spinal mobilization, Vestibular training, DME instructions, Cryotherapy, and Moist heat  PLAN FOR NEXT SESSION: Cont balance and gait training -- RLE strengthening  Review exercises for HEP for RLE & core strengthening, balance, gait training, endurance; assess if RW improves gait,   very unsafe w/ most dynamic gait challenges: guard closely, clamshells for R, posterior pelvic tilt+bridge, marching, bent knee fall outs, sit-to-stand variations, counter support- marches, extension, abduction, heel raises, standing balance progress towards tandem stance, per pt "I need to build up my endurance" Hx of seizures, no blaze pods, ask about blood glucose (low)  Liahm Grivas, Donavan Burnet, PT 04/16/2023, 7:45 PM

## 2023-04-16 NOTE — Therapy (Signed)
OUTPATIENT OCCUPATIONAL THERAPY NEURO TREATMENT  Patient Name: Jeff [name] MRN: [id number] DOB:09/05/1950, 72 y.o., male Today's Date: 04/16/2023  PCP: Anson Fret, MD  REFERRING PROVIDER: Ihor Austin, NP  END OF SESSION:  OT End of Session - 04/16/23 1450     Visit Number 6    Number of Visits 13    Date for OT Re-Evaluation 05/03/23    Authorization Type [employer] - requires auth    Progress Note Due on Visit 10    OT Start Time 1450    OT Stop Time 1530    OT Time Calculation (min) 40 min    Activity Tolerance Patient tolerated treatment well    Behavior During Therapy WFL for tasks assessed/performed             Past Medical History:  Diagnosis Date   ADHD (attention deficit hyperactivity disorder)    Diabetes mellitus    Hyperlipidemia    Hypertension    Renal disorder    Seizures (HCC)    Stroke [facility])    Past Surgical History:  Procedure Laterality Date   IR ANGIO INTRA EXTRACRAN SEL COM CAROTID INNOMINATE BILAT MOD SED  01/03/2021   IR ANGIO VERTEBRAL SEL SUBCLAVIAN INNOMINATE UNI L MOD SED  01/03/2021   IR ANGIO VERTEBRAL SEL VERTEBRAL UNI R MOD SED  01/03/2021   IR RADIOLOGIST EVAL & MGMT  01/13/2021   IR US GUIDE VASC ACCESS RIGHT  01/03/2021   NECK SURGERY     SPINE SURGERY     Cervical spine x 2; Vear Clock; Critzer.   Patient Active Problem List   Diagnosis Date Noted   Benign essential hypertension 11/20/2022   Hypomagnesemia 11/20/2022   Falls 11/20/2022   Obstructive sleep apnea (adult) (pediatric) 11/20/2022   Syncope and collapse 11/20/2022   Uncontrolled type 2 diabetes mellitus with hypoglycemia, with long-term current use of insulin (HCC) 11/20/2022   Acute encephalopathy 11/20/2022   Hypoglycemia 11/20/2022   TIA (transient ischemic attack) 03/10/2020   CKD (chronic kidney disease) 08/28/2018   Retinopathy of both eyes 08/28/2018   Stroke due to embolism of left vertebral artery (HCC) 08/12/2018   Stroke (HCC) 08/08/2018    Diabetes mellitus (HCC) 07/25/2015   Neck injury 06/22/2015   Hearing loss in right ear 10/14/2011   Agent orange exposure 10/14/2011   ADD (attention deficit disorder) 10/14/2011   Hyperlipidemia 10/14/2011   BMI 28.0-28.9,adult 10/14/2011    ONSET DATE: 03/04/2023 (date of referral)  REFERRING DIAG: G81.91 (ICD-10-CM) - Right hemiparesis (HCC) R26.9 (ICD-10-CM) - Gait abnormality  THERAPY DIAG:  Muscle weakness (generalized)  Other lack of coordination  Hemiplegia and hemiparesis following cerebral infarction affecting right dominant side (HCC)  Rationale for Evaluation and Treatment: Rehabilitation  SUBJECTIVE:   SUBJECTIVE STATEMENT: He has been using his R hand more. He did forget his putty today.   Pt accompanied by: significant other - [name]  PERTINENT HISTORY: PMH: hypertension, hyperlipidemia, diabetes mellitus, TIA, previous stroke (March 2020), h/o cervical fusion C5-7 (2012), h/o neck injury 2016, stage 3 CKD, seizures   Right sided weakness - decreased right hand dexterity, c/o numbness  PRECAUTIONS: Fall and Other: seizure  WEIGHT BEARING RESTRICTIONS: No  PAIN:  Are you having pain? No  FALLS: Has patient fallen in last 6 months? Yes. Number of falls "many" ~6 . Pt reported recent fall on 03/30/23 attempting to don pants while seated and slipped off side of bed.  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  HAND DOMINANCE: Right  ADLs: Overall ADLs: mod I Transfers/ambulation related to ADLs: UB Dressing: difficulty with buttons on sleeves and cufflinks LB Dressing: assistance with belt management to reach behind back  IADLs:  Community mobility: dependent Medication management: min A  Handwriting:  doesn't write a lot  MOBILITY STATUS: Independent  ACTIVITY TOLERANCE: Activity tolerance: Good to fair  FUNCTIONAL OUTCOME MEASURES: Quick Dash: 29.5 % disability with use of R hand   UPPER EXTREMITY ROM and MMT:    BUE - WFL  HAND FUNCTION: Grip strength: Right: 19.4 lbs; Left: 69.6 lbs  COORDINATION: 03/26/2023: 9 -hole peg Right 37; Left 32  SENSATION: Reports severe paresthesias in R hand  EDEMA: none reported or observed  MUSCLE TONE: WFL  COGNITION: Overall cognitive status: Within functional limits for tasks assessed  PERCEPTION: Not tested  PRAXIS: Not tested  OBSERVATIONS: Pt appears well-kept. He ambulates without use of AD. No LOB.   TODAY'S TREATMENT:                                                                                                                               Therapist reviewed goals with patient and updated patient progression.  No additional functional limitations identified. Objective measures assessed as noted in Goals section to determine progression towards goals. OT educated pt on towel stretches as noted in pt instructions for improved R shoulder ER and ability to use affected extremity with washing back  PATIENT EDUCATION: Education details: see today's treatment above Person educated: Patient and Spouse Education method: Explanation, Demonstration, and Handouts Education comprehension: verbalized understanding, returned demonstration, verbal cues required, tactile cues required, and needs further education  HOME EXERCISE PROGRAM: 03/26/2023: red putty and coordination HEPs 04/02/23 -  sensory safety considerations (handout provided, see pt instructions)  04/11/23 - Red Theraband HEP - Access Code: [email or web address] URL: [email or web address] Date: 04/11/2023 Prepared by: Carilyn Goodpasture  Exercises - Seated Elbow Flexion with Self-Anchored Resistance  - 2 x daily - 2 sets - 10 reps - Seated Bilateral Shoulder External Rotation with Resistance  - 2 x daily - 2 sets - 10 reps 04/16/2023: Towel stretches  GOALS:  SHORT TERM GOALS: Target date: 04/19/2023    Patient will demonstrate independence with initial RUE HEP. Baseline: Goal status: MET  2.  Pt will report no more than moderate difficulty washing back with RUE using adaptive strategies or AD as needed.  Baseline: severe difficulty Goal status: IN  PROGRESS  3.  Pt will report RUE as primary hand for completion of at least 3 daily activities. Baseline: only uses R hand with TV remote 04/16/2023: Uses hand to hold and open coke bottles, open mail, use remote, use vacuum Goal status: MET  4.  Pt will independently recall the 5 main sensory precautions (cold, heat, sharp, chemical, and heavy) as needed to prevent injury/harm secondary to impairments.   Baseline: new to outpt OT 04/11/23 - Pt verbalized  3 out of 5 precautions ind, and remaining 2 out of 5 with min v/c. Goal status: In progress   LONG TERM GOALS: Target date: 05/03/2023  Patient will demonstrate updated RUE HEP with 25% verbal cues or less for proper execution. Baseline:  Goal status: INITIAL  2.  Pt will report use of RUE >50% of time vs non-dominant LUE. Baseline: less than 50% of the time Goal status: IN PROGRESS  3.  Patient will demonstrate at least 30 lbs R grip strength as needed to open jars and other containers. Baseline: 19.4 lbs 04/04/23 - 44.3 lbs 04/16/2023: 70 lbs Goal status: 04/04/23 - MET  4.  Pt will be able to open a tight or new jar with no more than moderate difficulty reported.  Baseline: unable Goal status:  INITIAL  ASSESSMENT:  CLINICAL IMPRESSION: Pt demonstrating significant progress towards goals this date. Will continue to monitor progress and d/c when pt has met max rehab potential.   PERFORMANCE DEFICITS: in functional skills including ADLs, IADLs, coordination, sensation, strength, Fine motor control, and UE functional use.   IMPAIRMENTS: are limiting patient from ADLs, IADLs, and leisure.   CO-MORBIDITIES: may have co-morbidities  that affects occupational performance. Patient will benefit from skilled OT to address above impairments and improve overall function.  REHAB POTENTIAL: Fair given chronicity of symptoms  PLAN:  OT FREQUENCY: 2x/week  OT DURATION: 6 weeks  PLANNED INTERVENTIONS: 97168 OT Re-evaluation, 97535 self care/ADL training, 95621 therapeutic exercise, 97530 therapeutic activity, 97112 neuromuscular re-education, 97140 manual therapy, 97035 ultrasound, 97018 paraffin, 30865 fluidotherapy, 97010 moist heat, 97032 electrical stimulation (manual), 97760 Orthotics management and training, 78469 Splinting (initial encounter), [phone] Subsequent splinting/medication, functional mobility training, energy conservation, coping strategies training, patient/family education, and DME and/or AE instructions  RECOMMENDED OTHER SERVICES: none at this time  CONSULTED AND AGREED WITH PLAN OF CARE: Patient and family member/caregiver  PLAN FOR NEXT SESSION:  Review RUE red putty and coordination HEP PRN Review sensory precautions and stimulation PRN Bathing tasks - reiterate safety and long-handled sponge PRN  Continue to Assess goals  Coordination tasks - nuts/bolts, Blaze Pods UE strengthening tasks - progress theraband PRN, grip strengthening with grip and large pegs  Delana Meyer, OT 04/16/2023, 4:10 PM

## 2023-04-18 ENCOUNTER — Ambulatory Visit: Payer: No Typology Code available for payment source | Admitting: Physical Therapy

## 2023-04-18 ENCOUNTER — Encounter: Payer: No Typology Code available for payment source | Admitting: Occupational Therapy

## 2023-04-23 ENCOUNTER — Ambulatory Visit: Payer: Medicare PPO | Admitting: Occupational Therapy

## 2023-04-23 ENCOUNTER — Ambulatory Visit: Payer: Medicare PPO | Admitting: Physical Therapy

## 2023-04-23 DIAGNOSIS — R29818 Other symptoms and signs involving the nervous system: Secondary | ICD-10-CM

## 2023-04-23 DIAGNOSIS — M6281 Muscle weakness (generalized): Secondary | ICD-10-CM

## 2023-04-23 DIAGNOSIS — R2681 Unsteadiness on feet: Secondary | ICD-10-CM

## 2023-04-23 DIAGNOSIS — R278 Other lack of coordination: Secondary | ICD-10-CM

## 2023-04-23 DIAGNOSIS — R2689 Other abnormalities of gait and mobility: Secondary | ICD-10-CM

## 2023-04-23 DIAGNOSIS — I69351 Hemiplegia and hemiparesis following cerebral infarction affecting right dominant side: Secondary | ICD-10-CM | POA: Diagnosis not present

## 2023-04-23 DIAGNOSIS — R293 Abnormal posture: Secondary | ICD-10-CM | POA: Diagnosis not present

## 2023-04-23 DIAGNOSIS — R208 Other disturbances of skin sensation: Secondary | ICD-10-CM | POA: Diagnosis not present

## 2023-04-23 NOTE — Therapy (Unsigned)
OUTPATIENT OCCUPATIONAL THERAPY NEURO TREATMENT  Patient Name: Jeff [name] MRN: [id number] DOB:07/15/1950, 72 y.o., male Today's Date: 04/23/2023  PCP: Anson Fret, MD  REFERRING PROVIDER: Ihor Austin, NP  END OF SESSION:  OT End of Session - 04/23/23 1449     Visit Number 7    Number of Visits 13    Date for OT Re-Evaluation 05/03/23    Authorization Type [employer] - requires auth    Progress Note Due on Visit 10    OT Start Time 1449    OT Stop Time 1529    OT Time Calculation (min) 40 min    Activity Tolerance Patient tolerated treatment well    Behavior During Therapy WFL for tasks assessed/performed             Past Medical History:  Diagnosis Date   ADHD (attention deficit hyperactivity disorder)    Diabetes mellitus    Hyperlipidemia    Hypertension    Renal disorder    Seizures (HCC)    Stroke [facility])    Past Surgical History:  Procedure Laterality Date   IR ANGIO INTRA EXTRACRAN SEL COM CAROTID INNOMINATE BILAT MOD SED  01/03/2021   IR ANGIO VERTEBRAL SEL SUBCLAVIAN INNOMINATE UNI L MOD SED  01/03/2021   IR ANGIO VERTEBRAL SEL VERTEBRAL UNI R MOD SED  01/03/2021   IR RADIOLOGIST EVAL & MGMT  01/13/2021   IR US GUIDE VASC ACCESS RIGHT  01/03/2021   NECK SURGERY     SPINE SURGERY     Cervical spine x 2; Vear Clock; Critzer.   Patient Active Problem List   Diagnosis Date Noted   Benign essential hypertension 11/20/2022   Hypomagnesemia 11/20/2022   Falls 11/20/2022   Obstructive sleep apnea (adult) (pediatric) 11/20/2022   Syncope and collapse 11/20/2022   Uncontrolled type 2 diabetes mellitus with hypoglycemia, with long-term current use of insulin (HCC) 11/20/2022   Acute encephalopathy 11/20/2022   Hypoglycemia 11/20/2022   TIA (transient ischemic attack) 03/10/2020   CKD (chronic kidney disease) 08/28/2018   Retinopathy of both eyes 08/28/2018   Stroke due to embolism of left vertebral artery (HCC) 08/12/2018   Stroke (HCC) 08/08/2018    Diabetes mellitus (HCC) 07/25/2015   Neck injury 06/22/2015   Hearing loss in right ear 10/14/2011   Agent orange exposure 10/14/2011   ADD (attention deficit disorder) 10/14/2011   Hyperlipidemia 10/14/2011   BMI 28.0-28.9,adult 10/14/2011    ONSET DATE: 03/04/2023 (date of referral)  REFERRING DIAG: G81.91 (ICD-10-CM) - Right hemiparesis (HCC) R26.9 (ICD-10-CM) - Gait abnormality  THERAPY DIAG:  Other lack of coordination  Muscle weakness (generalized)  Other symptoms and signs involving the nervous system  Rationale for Evaluation and Treatment: Rehabilitation  SUBJECTIVE:   SUBJECTIVE STATEMENT: He had lots of great things to say about his OT in the hospital [facility] who helped start his recovery. He reports that he has continued to work on using his R hand more but did not his upper R arm has bothered him from an old injury as well.  He did forget his putty and bands today.   Pt accompanied by: significant other - [name]  PERTINENT HISTORY: PMH: hypertension, hyperlipidemia, diabetes mellitus, TIA, previous stroke (March 2020), h/o cervical fusion C5-7 (2012), h/o neck injury 2016, stage 3 CKD, seizures   Right sided weakness - decreased right hand dexterity, c/o numbness  PRECAUTIONS: Fall and Other: seizure  WEIGHT BEARING RESTRICTIONS: No  PAIN:  Are you having pain? No  FALLS: Has  patient fallen in last 6 months? Yes. Number of falls "many" ~6 . Pt reported recent fall on 03/30/23 attempting to don pants while seated and slipped off side of bed.  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  HAND DOMINANCE: Right  ADLs: Overall ADLs: mod I Transfers/ambulation related to ADLs: UB Dressing: difficulty with buttons on sleeves and cufflinks LB Dressing: assistance with belt management to reach behind back  IADLs:  Community mobility: dependent Medication management: min A  Handwriting:  doesn't write a lot  MOBILITY STATUS: Independent  ACTIVITY TOLERANCE: Activity tolerance: Good to fair  FUNCTIONAL OUTCOME MEASURES: Quick Dash: 29.5 % disability with use of R hand   UPPER EXTREMITY ROM and MMT:    BUE - WFL  HAND FUNCTION: Grip strength: Right: 19.4 lbs; Left: 69.6 lbs  COORDINATION: 03/26/2023: 9 -hole peg Right 37; Left 32 04/23/23 Right - 32  sec Left - 32 sec  SENSATION: Reports severe paresthesias in R hand  EDEMA: none reported or observed  MUSCLE TONE: WFL  COGNITION: Overall cognitive status: Within functional limits for tasks assessed  PERCEPTION: Not tested  PRAXIS: Not tested  OBSERVATIONS: Pt appears well-kept. He ambulates without use of AD. No LOB.   TODAY'S TREATMENT:                                                                                                                               Therapist reviewed goals with patient and updated patient progression.  No additional functional limitations identified and discussed probable upcoming discharge upon review with primary treating therapist Pt able to recall 3/5 sensory precautions ie) recalled heat (and 'stuff'), sharp, and chemical, on his own.  Cues provided to recall cold/heavy with cues 9 hole peg test completed with following improvement eval 03/26/23 Right 37 sec; Left 32sec Today: Right - 32  sec Left - 32 sec Reviewed coordination Activities explored with  RUE per previous handouts and as identified as areas of difficulty by pt.  Activities designed to work on RUE finger ROM, dexterity and isolated movements with demonstration and practice, as well as modification, and cues throughout to improve technique, digital isolation and ease of performing task   Rotate ball in fingertips clockwise and counter-clockwise as well as flipping forward and backwards to work individual fingers and thumb, as well as rotating golf balls in hand - supinated on table top for increased control and ease, then  pronated in hand for increased challenge. Pressing golf tee pegs into putty, placing marbles atop pegs, removing objects one at a time to get multiple in hand before releasing them via in hand manipulation, one at a time.   Patient not able to identify many UE activities he is doing at home and is encouraged to set out different activities around the home to increase participation in putty and coordination tasks for a few minutes when he is near the task ie) kitchen table, living room coffee table, bedside table, bathroom etc.  Pt encouraged to try something everyday throughout the week.   PATIENT EDUCATION: Education details: Psychiatrist Person educated: Patient and Spouse Education method: Explanation, Demonstration, and Handouts Education comprehension: verbalized understanding, returned demonstration, verbal cues required, tactile cues required, and needs further education  HOME EXERCISE PROGRAM: 03/26/2023: red putty and coordination HEPs 04/02/23 - sensory safety considerations (handout provided, see pt instructions 04/11/23 - Red Theraband HEP - Access Code: [email or web address] 04/16/2023: Towel stretches  GOALS:  SHORT TERM GOALS: Target date: 04/19/2023    Patient will demonstrate independence with initial RUE HEP. Baseline: Goal status: MET  2.  Pt will report no more than moderate difficulty washing back with RUE using adaptive strategies or AD as  needed.  Baseline: severe difficulty Goal status: MET  3.  Pt will report RUE as primary hand for completion of at least 3 daily activities. Baseline: only uses R hand with TV remote 04/16/2023: Uses hand to hold and open coke bottles, open mail, use remote, use vacuum Goal status: MET  4.  Pt will independently recall the 5 main sensory precautions (cold, heat, sharp, chemical, and heavy) as needed to prevent injury/harm secondary to impairments.   Baseline: new to outpt OT 04/11/23 - Pt verbalized  3 out of 5 precautions ind, and remaining 2 out of 5 with min v/c. Goal status: In progress   LONG TERM GOALS: Target date: 05/03/2023  Patient will demonstrate updated RUE HEP with 25% verbal cues or less for proper execution. Baseline:  Goal status: IN Progress  2.  Pt will report use of RUE >50% of time vs non-dominant LUE. Baseline: less than 50% of the time Goal status: MET  3.  Patient will demonstrate at least 30 lbs R grip strength as needed to open jars and other containers. Baseline: 19.4 lbs 04/04/23 - 44.3 lbs 04/16/2023: 70 lbs Goal status: 04/04/23 - MET  4.  Pt will be able to open a tight or new jar with no more than moderate difficulty reported.  Baseline: unable Goal status: MET  ASSESSMENT:  CLINICAL IMPRESSION: Pt demonstrating excellent progress towards established goals with possible d/c soon if primary OT in agreement.   PERFORMANCE DEFICITS: in functional skills including ADLs, IADLs, coordination, sensation, strength, Fine motor control, and UE functional use.   IMPAIRMENTS: are limiting patient from ADLs, IADLs, and leisure.   CO-MORBIDITIES: may have co-morbidities  that affects occupational performance. Patient will benefit from skilled OT to address above impairments and improve overall function.  REHAB POTENTIAL: Fair given chronicity of symptoms  PLAN:  OT FREQUENCY: 2x/week  OT DURATION: 6 weeks  PLANNED INTERVENTIONS: 97168 OT  Re-evaluation, 97535 self care/ADL training, 96045 therapeutic exercise, 97530 therapeutic activity, 97112 neuromuscular re-education, 97140 manual therapy, 97035 ultrasound, 97018 paraffin, 40981 fluidotherapy, 97010 moist heat, 97032 electrical stimulation (manual), [id number] Orthotics management and training, 19147 Splinting (initial encounter), [id number] Subsequent splinting/medication, functional mobility training, energy conservation, coping strategies training, patient/family education, and DME and/or  AE instructions  RECOMMENDED OTHER SERVICES: none at this time  CONSULTED AND AGREED WITH PLAN OF CARE: Patient and family member/caregiver  PLAN FOR NEXT SESSION:   DC considerations/Continue to Assess goals  Review RUE red putty and coordination HEP PRN Review sensory precautions and stimulation PRN  Coordination tasks - nuts/bolts, Blaze Pods UE strengthening tasks - progress theraband PRN, grip strengthening with grip and large pegs Bathing tasks - reiterate safety and long-handled sponge PRN  Victorino Sparrow, OT 04/23/2023, 4:55 PM

## 2023-04-23 NOTE — Therapy (Unsigned)
OUTPATIENT PHYSICAL THERAPY NEURO TREATMENT   Patient Name: Jeff [name] MRN: [id number] DOB:[birth year], 72 y.o., male Today's Date: 04/24/2023   PCP: Anson Fret, MD  REFERRING PROVIDER: Ihor Austin, NP  END OF SESSION:  PT End of Session - 04/24/23 0911     Visit Number 6    Number of Visits 13   plus eval   Date for PT Re-Evaluation 05/17/23   to allow for scheduling delays   Authorization Type [employer] CHOICE PPO    Authorization Time Period 10-17 - 05-17-23    Authorization - Number of Visits 13    PT Start Time 1403    PT Stop Time 1445    PT Time Calculation (min) 42 min    Equipment Utilized During Treatment Gait belt    Activity Tolerance Patient tolerated treatment well   mild dyspnea experienced   Behavior During Therapy WFL for tasks assessed/performed                  Past Medical History:  Diagnosis Date   ADHD (attention deficit hyperactivity disorder)    Diabetes mellitus    Hyperlipidemia    Hypertension    Renal disorder    Seizures (HCC)    Stroke [facility])    Past Surgical History:  Procedure Laterality Date   IR ANGIO INTRA EXTRACRAN SEL COM CAROTID INNOMINATE BILAT MOD SED  01/03/2021   IR ANGIO VERTEBRAL SEL SUBCLAVIAN INNOMINATE UNI L MOD SED  01/03/2021   IR ANGIO VERTEBRAL SEL VERTEBRAL UNI R MOD SED  01/03/2021   IR RADIOLOGIST EVAL & MGMT  01/13/2021   IR US GUIDE VASC ACCESS RIGHT  01/03/2021   NECK SURGERY     SPINE SURGERY     Cervical spine x 2; Vear Clock; Critzer.   Patient Active Problem List   Diagnosis Date Noted   Benign essential hypertension 11/20/2022   Hypomagnesemia 11/20/2022   Falls 11/20/2022   Obstructive sleep apnea (adult) (pediatric) 11/20/2022   Syncope and collapse 11/20/2022   Uncontrolled type 2 diabetes mellitus with hypoglycemia, with long-term current use of insulin (HCC) 11/20/2022   Acute encephalopathy 11/20/2022   Hypoglycemia 11/20/2022   TIA (transient ischemic attack) 03/10/2020    CKD (chronic kidney disease) 08/28/2018   Retinopathy of both eyes 08/28/2018   Stroke due to embolism of left vertebral artery (HCC) 08/12/2018   Stroke (HCC) 08/08/2018   Diabetes mellitus (HCC) 07/25/2015   Neck injury 06/22/2015   Hearing loss in right ear 10/14/2011   Agent orange exposure 10/14/2011   ADD (attention deficit disorder) 10/14/2011   Hyperlipidemia 10/14/2011   BMI 28.0-28.9,adult 10/14/2011    ONSET DATE: 03/04/2023 (referral date)  REFERRING DIAG: G81.91 (ICD-10-CM) - Right hemiparesis (HCC)  THERAPY DIAG:  Unsteadiness on feet  Other abnormalities of gait and mobility  Muscle weakness (generalized)  Rationale for Evaluation and Treatment: Rehabilitation  SUBJECTIVE:  SUBJECTIVE STATEMENT:  Pt reports he had another seizure in church on Sunday - happened near end of Sunday school - had someone go get [name] to help him.  Went home afterwards, didn't stay for church service.  Feels good today- brought bottle of water with him today.   Pt accompanied by: family member: wife, [name]  PERTINENT HISTORY: hx of left cerebellum stroke (March 2020) with residual imbalance/gait impairment and right-sided ataxia, TIA (July 2022), transient AMS possibly in setting of hypoglycemia, left-sided headaches, hypertension, hyperlipidemia, diabetes mellitus, h/o cervical fusion C5-7 (2012), h/o neck injury 2016, stage 3 CKD, and intracranial stenosis   PAIN:  Are you having pain? Yes - headache - moderate intensity at start of   PRECAUTIONS: Fall  RED FLAGS: Bowel or bladder incontinence: No   WEIGHT BEARING RESTRICTIONS: No  FALLS: Has patient fallen in last 6 months? Yes. Number of falls "Too many to remember": "in the last 2 weeks I fell once", has not gone to hospital for any of  these falls, has hit head with one- "fell out of bed and hit head on nightstand" needed help from wife to get away from the bed but stood on own. Had fall in bathroom. Not typically falling when walking, but getting up from sitting. No reports of dizziness or lightheadedness.   LIVING ENVIRONMENT: Lives with: lives with their spouse Lives in: House/apartment Stairs: Yes: External: 1 to porch, 1 to foyer steps; none can use ramp at back of house Has following equipment at home:  "I've got it all"  PLOF: Needs assistance with ADLs- some dressing like little buttons on shirt sleeves or cufflinks, has not driven in over 2 years  PATIENT GOALS: "walk good", "get back to as normal as I can"  OBJECTIVE:  Note: Objective measures were completed at Evaluation unless otherwise noted.  DIAGNOSTIC FINDINGS:   MRI HEAD WITHOUT AND WITH CONTRAST (11/21/2022) IMPRESSION: 1. No acute intracranial abnormality or mass. No specific findings to explain seizures. 2. Unchanged moderate chronic small-vessel disease and central pattern of volume loss.  04/08/2023  5:20 PM repeat MRI WO Contrast   SENSATION: R hand "has gone to sleep", feels like this all the time, pt feels that it is colder than the L , impaired sensation on the top of both feet  EDEMA:  "Feet will swell every once in a while"  LOWER EXTREMITY MMT:    MMT Right Eval Left Eval  Hip flexion 3 4-  Hip extension    Hip abduction    Hip adduction    Hip internal rotation    Hip external rotation    Knee flexion 3 5  Knee extension 3 5  Ankle dorsiflexion 3+ 5  Ankle plantarflexion    Ankle inversion    Ankle eversion    (Blank rows = not tested)   STAIRS: Level of Assistance: CGA and Min A Stair Negotiation Technique: Step to Pattern with Bilateral Rails Number of Stairs: 4  Height of Stairs: standard  Comments: pt w/ increased difficulty descending requiring Min A for balance "I'm not sure I could have done that without  your help"  GAIT: Gait pattern: step through pattern, decreased arm swing- Right, decreased arm swing- Left, decreased step length- Right, decreased step length- Left, decreased hip/knee flexion- Right, decreased ankle dorsiflexion- Right, decreased ankle dorsiflexion- Left, trunk flexed, poor foot clearance- Right, and poor foot clearance- Left Distance walked: various clinical distances Assistive device utilized: None Level of assistance: SBA Comments: consider assessing  w/ ADs   TODAY'S TREATMENT:                                                                                                                               Blood Sugar:  319 at start of session  There were no vitals filed for this visit.     TherEx: Sit to stand from mat 5 reps without UE support with feet on Airex - CGA  Step up exercise RLE onto 6" step for RLE strengthening 10 reps with pt holding onto both rails  Step up exercise with RLE leading, then stepping up with LLE onto 1st step without UE support to challenge balance - 10 reps with CGA to min assist for balance recovery  Bil. Heel raises 10 reps:  RLE unilateral heel raise 10 reps  Resisted ambulation with red theraband for hip musc. Strengthening and for improved dynamic balance with perturbations; sideways amb. Approx. 25' x 2 reps; forward resisted amb. Approx. 25' x 2 reps (pt fatigued) so hip extension with red theraband performed in standing position 10 reps each leg  NeuroRe-ed:    Pt performed taps to 3 colored discs to improve SLS on each leg - with CGA   Alternate tap ups to 1st step without UE support with CGA 5 reps each leg, tap ups to 2nd step 5 reps each leg with CGA  Pt performed forwards amb. Tossing and catching medium sized ball approx. 25' x 2 reps; backwards amb. 25' x 1 rep tossing & catching ball with CGA  Pt stood in Romberg position on floor with EC for 10 secs - SBA - pt had no LOB  Gait:      Pt performed ambulation with  horizontal head turns approx. 35' x 2 reps;  amb. With vertical head turns 35' x 1 rep Gait pattern: step through pattern, decreased arm swing- Right, decreased arm swing- Left, decreased step length- Right, decreased step length- Left, decreased hip/knee flexion- Right, decreased ankle dorsiflexion- Right, decreased ankle dorsiflexion- Left, trunk flexed, poor foot clearance- Right, and poor foot clearance- Left Distance walked: various clinical distances Assistive device utilized: None Level of assistance: SBA  PATIENT EDUCATION:  Education details: POC, continue HEP, outcome measures, STG results Person educated: Patient and Spouse Education method: Explanation Education comprehension: verbalized understanding and needs further education  HOME EXERCISE PROGRAM:  To be initiated Access Code: [email or web address] URL: [email or web address] Date: 03/29/2023 Prepared by: Beverely Low  Exercises - Standing Balance in Corner  - 1 x daily - 7 x weekly - 3 sets - 10 reps - Standing Balance in Corner with Eyes Closed  - 1 x daily - 7 x weekly - 3 sets - 10 reps  GOALS: Goals reviewed with patient? No  SHORT TERM GOALS: Target date: 04/11/2023   Pt will improve FGA to at least 10/30 (MCID 4, Fall risk <22) for improved dynamic balance and safety.   Baseline: 6/30 (10/17), 12/30 (  04/11/23) Goal status: MET  2.  Pt will be independent with initial HEP for improved strength, balance, transfers and gait.  Baseline:  Goal status: MET  3.  Pt will improve gait velocity to at least 2.50 ft/sec for improved gait efficiency and safety in the home Baseline: 1.93 ft/sec (03/21/23), 2.10 ft/sec (04/11/23) Goal status: PROGRESSING  4.  Pt will hold romberg stance for 30 seconds with eyes open on solid ground with no LOB and SBA. Baseline: could not perform, met (04/11/23) Goal status: MET   LONG TERM GOALS: Target date: 05/02/2023  Pt will improve FGA to at least 14/30 (MCID 4, Fall risk  <22) for improved dynamic balance and safety.   Baseline: 6/30 (03/21/23), 12/30 (04/11/23) Goal status: INITIAL  2.  Pt will be independent with final HEP for improved strength, balance, transfers and gait.  Baseline:  Goal status: INITIAL  3.  Pt will improve gait velocity to at least 2.50 ft/sec (norm) for improved gait efficiency and safety in the community  Baseline: 1.93 ft/sec (03/21/23), 2.10 ft/sec (04/11/23) Goal status: REVISED  4.  Pt will hold romberg stance for 30 seconds with eyes closed on solid ground with no LOB and SBA. Baseline: could hold eyes open (04/11/23) Goal status: INITIAL   ASSESSMENT:  CLINICAL IMPRESSION:  PT session focused on RLE strengthening with use of red theraband for resisted ambulation with pt ambulating sideways and forwards;  pt reported fatigue after this activity so backwards ambulation with red theraband was not performed - modified to performing hip extension in standing position only.  Pt demonstrated improved balance with ambulation with horizontal head turns and also with vertical head turns.  Pt continues to report that balance is more challenged with vertical head turns during amb. Compared to horizontal head turns.  Continue POC.   OBJECTIVE IMPAIRMENTS: Abnormal gait, decreased activity tolerance, decreased balance, decreased endurance, decreased knowledge of use of DME, decreased mobility, difficulty walking, decreased ROM, decreased strength, decreased safety awareness, dizziness, hypomobility, impaired flexibility, and impaired sensation.   ACTIVITY LIMITATIONS: lifting, bending, squatting, stairs, transfers, bathing, dressing, and hygiene/grooming  PARTICIPATION LIMITATIONS: meal prep, cleaning, laundry, driving, shopping, community activity, occupation, and yard work  PERSONAL FACTORS: Age, Time since onset of injury/illness/exacerbation, and 3+ comorbidities:    hx left cerebellum stroke, TIA (July 2022), transient AMS possibly in  setting of hypoglycemia, left-sided headaches, hypertension, hyperlipidemia, diabetes mellitus, h/o cervical fusion C5-7 (2012), h/o neck injury 2016, stage 3 CKD, and intracranial stenosis are also affecting patient's functional outcome.   REHAB POTENTIAL: Fair original stroke was in 2020, potentially had a new stroke, balance and gait have declined since last seen at this clinic  CLINICAL DECISION MAKING: Evolving/moderate complexity  EVALUATION COMPLEXITY: Moderate  PLAN:   PT FREQUENCY: 2x/week  PT DURATION: 6 weeks  PLANNED INTERVENTIONS: 97164- PT Re-evaluation, 97110-Therapeutic exercises, 97530- Therapeutic activity, 97112- Neuromuscular re-education, 97535- Self Care, 16109- Manual therapy, [phone]- Gait training, [phone]- Canalith repositioning, 97014- Electrical stimulation (unattended), [phone]- Electrical stimulation (manual), Patient/Family education, Balance training, Stair training, Taping, Dry Needling, Joint mobilization, Joint manipulation, Spinal manipulation, Spinal mobilization, Vestibular training, DME instructions, Cryotherapy, and Moist heat  PLAN FOR NEXT SESSION: Cont balance and gait training -- RLE strengthening  Review exercises for HEP for RLE & core strengthening, balance, gait training, endurance; assess if RW improves gait,   very unsafe w/ most dynamic gait challenges: guard closely, clamshells for R, posterior pelvic tilt+bridge, marching, bent knee fall outs, sit-to-stand variations, counter support- marches, extension,  abduction, heel raises, standing balance progress towards tandem stance, per pt "I need to build up my endurance" Hx of seizures, no blaze pods, ask about blood glucose (low)  Elion Hocker, Donavan Burnet, PT 04/24/2023, 9:13 AM

## 2023-04-24 ENCOUNTER — Encounter: Payer: Self-pay | Admitting: Physical Therapy

## 2023-04-25 ENCOUNTER — Ambulatory Visit: Payer: Medicare PPO | Admitting: Occupational Therapy

## 2023-04-25 ENCOUNTER — Encounter: Payer: Self-pay | Admitting: Physical Therapy

## 2023-04-25 ENCOUNTER — Ambulatory Visit: Payer: Medicare PPO | Admitting: Physical Therapy

## 2023-04-25 DIAGNOSIS — R208 Other disturbances of skin sensation: Secondary | ICD-10-CM

## 2023-04-25 DIAGNOSIS — R29818 Other symptoms and signs involving the nervous system: Secondary | ICD-10-CM | POA: Diagnosis not present

## 2023-04-25 DIAGNOSIS — R2689 Other abnormalities of gait and mobility: Secondary | ICD-10-CM

## 2023-04-25 DIAGNOSIS — I69351 Hemiplegia and hemiparesis following cerebral infarction affecting right dominant side: Secondary | ICD-10-CM | POA: Diagnosis not present

## 2023-04-25 DIAGNOSIS — R2681 Unsteadiness on feet: Secondary | ICD-10-CM | POA: Diagnosis not present

## 2023-04-25 DIAGNOSIS — R293 Abnormal posture: Secondary | ICD-10-CM | POA: Diagnosis not present

## 2023-04-25 DIAGNOSIS — R278 Other lack of coordination: Secondary | ICD-10-CM | POA: Diagnosis not present

## 2023-04-25 DIAGNOSIS — M6281 Muscle weakness (generalized): Secondary | ICD-10-CM

## 2023-04-25 NOTE — Therapy (Signed)
OUTPATIENT OCCUPATIONAL THERAPY NEURO TREATMENT & DISCHARGE SUMMARY  Patient Name: Jeff [name] MRN: [id number] DOB:Feb 22, 1951, 72 y.o., male Today's Date: 04/25/2023  PCP: Anson Fret, MD  REFERRING PROVIDER: Ihor Austin, NP  END OF SESSION:  OT End of Session - 04/25/23 1453     Visit Number 8    Number of Visits 13    Date for OT Re-Evaluation 05/03/23    Authorization Type [employer] - requires auth    Progress Note Due on Visit 10    OT Start Time 1452    OT Stop Time 1535    OT Time Calculation (min) 43 min    Equipment Utilized During Treatment Theraband    Activity Tolerance Patient tolerated treatment well    Behavior During Therapy WFL for tasks assessed/performed             Past Medical History:  Diagnosis Date   ADHD (attention deficit hyperactivity disorder)    Diabetes mellitus    Hyperlipidemia    Hypertension    Renal disorder    Seizures (HCC)    Stroke [facility])    Past Surgical History:  Procedure Laterality Date   IR ANGIO INTRA EXTRACRAN SEL COM CAROTID INNOMINATE BILAT MOD SED  01/03/2021   IR ANGIO VERTEBRAL SEL SUBCLAVIAN INNOMINATE UNI L MOD SED  01/03/2021   IR ANGIO VERTEBRAL SEL VERTEBRAL UNI R MOD SED  01/03/2021   IR RADIOLOGIST EVAL & MGMT  01/13/2021   IR US GUIDE VASC ACCESS RIGHT  01/03/2021   NECK SURGERY     SPINE SURGERY     Cervical spine x 2; Vear Clock; Critzer.   Patient Active Problem List   Diagnosis Date Noted   Benign essential hypertension 11/20/2022   Hypomagnesemia 11/20/2022   Falls 11/20/2022   Obstructive sleep apnea (adult) (pediatric) 11/20/2022   Syncope and collapse 11/20/2022   Uncontrolled type 2 diabetes mellitus with hypoglycemia, with long-term current use of insulin (HCC) 11/20/2022   Acute encephalopathy 11/20/2022   Hypoglycemia 11/20/2022   TIA (transient ischemic attack) 03/10/2020   CKD (chronic kidney disease) 08/28/2018   Retinopathy of both eyes 08/28/2018   Stroke due to embolism of  left vertebral artery (HCC) 08/12/2018   Stroke (HCC) 08/08/2018   Diabetes mellitus (HCC) 07/25/2015   Neck injury 06/22/2015   Hearing loss in right ear 10/14/2011   Agent orange exposure 10/14/2011   ADD (attention deficit disorder) 10/14/2011   Hyperlipidemia 10/14/2011   BMI 28.0-28.9,adult 10/14/2011    ONSET DATE: 03/04/2023 (date of referral)  REFERRING DIAG: G81.91 (ICD-10-CM) - Right hemiparesis (HCC) R26.9 (ICD-10-CM) - Gait abnormality  THERAPY DIAG:  Muscle weakness (generalized)  Other lack of coordination  Other disturbances of skin sensation  Rationale for Evaluation and Treatment: Rehabilitation  SUBJECTIVE:   SUBJECTIVE STATEMENT: Pt notices something different that he can use the R hand for every day.  Pt fixed his own lunch today - he opened a can of peaches himself and the cottage cheese, he used a spoon to cut up the entire can of peaches with his right hand and  mixed the whole can and whole container of cottage cheese himself.  Pt also reported he showered and dressed himself and used nail clippers on his own.  He remembered his putty and bands today.   Pt accompanied by: significant other - [name]  PERTINENT HISTORY: PMH: hypertension, hyperlipidemia, diabetes mellitus, TIA, previous stroke (March 2020), h/o cervical fusion C5-7 (2012), h/o neck injury 2016, stage 3 CKD,  seizures   Right sided weakness - decreased right hand dexterity, c/o numbness  PRECAUTIONS: Fall and Other: seizure  WEIGHT BEARING RESTRICTIONS: No  PAIN:  Are you having pain? No  FALLS: Has patient fallen in last 6 months? Yes. Number of falls "many" ~6 . Pt reported recent fall on 03/30/23 attempting to don pants while seated and slipped off side of bed.  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  HAND DOMINANCE: Right  ADLs: Overall ADLs: mod I Transfers/ambulation related to ADLs: UB Dressing: difficulty with buttons on sleeves and cufflinks LB Dressing: assistance with belt management to reach behind back  IADLs:  Community mobility: dependent Medication management: min A  Handwriting:  doesn't write a lot  MOBILITY STATUS: Independent  ACTIVITY TOLERANCE: Activity tolerance: Good to fair  FUNCTIONAL OUTCOME MEASURES: EVAL: Quick Dash: 29.5 % disability with use of R hand  DC: Quick Dash: 4.5  UPPER EXTREMITY ROM and MMT:   BUE - WFL  HAND FUNCTION: Grip strength: Right: 19.4 lbs; Left: 69.6 lbs  COORDINATION: 03/26/2023: 9 -hole peg Right 37; Left 32 04/23/23 Right - 32  sec Left - 32 sec  SENSATION: Reports severe paresthesias in R hand  EDEMA: none reported or observed  MUSCLE TONE: WFL  COGNITION: Overall cognitive status: Within functional limits for tasks assessed  PERCEPTION: Not tested  PRAXIS: Not tested  OBSERVATIONS: Pt appears well-kept. He ambulates without use of AD. No LOB.   TODAY'S TREATMENT:                                                                                                                               Therapist reviewed goals with patient no additional functional limitations identified and discussed discharge today with pt in agreement. Pt able to recall 4/5 sensory precautions ie) recalled heat, sharp, heavy and chemical, on his own.  Min cue provided to recall cold.  Reviewed putty activities  including searching for hidden objects in putty.  Emphasized ongoing HEP/functional activities at home for R UE coordination and strength.  Reviewed and updated theraband exercise - Single Arm Shoulder Extension with Anchored Resistance  - pt is given a longer piece of theraband to use over a door with increased comfort and tolerance to various motions.  Pt encouraged to find a door at home where he can set up band to work on a couple of times a day when he walks past  it. Practiced and progressed theraband exercises in standing at a closed door with red theraband with cues to work on max control for motions to work on shoulder flexion/extension, elbow extension/flexion etc.  Various positions used to to change motions ie) by turning body 90 degrees x 4 positions for  Standing shoulder Extension with Anchored Resistance at top of the door ie) pull down, PNF like patterns L to R and R to L as well as pushing out motions with back facing the door.   PATIENT EDUCATION: Education details: Theraband Exercise Person educated: Patient and Spouse Education method: Explanation, Demonstration, and Handouts Education comprehension: verbalized understanding, returned demonstration, and verbal cues required  HOME EXERCISE PROGRAM: 03/26/2023: red putty and coordination HEPs 04/02/23 - sensory safety considerations (handout provided, see pt instructions 04/11/23 - Red Theraband HEP - Access Code: [email or web address] 04/16/2023: Towel stretches 04/25/23: Theraband - Access Code: [email or web address]  GOALS:  SHORT TERM GOALS: Target date: 04/19/2023    Patient will demonstrate independence with initial RUE HEP. Baseline: Goal status: MET  2.  Pt will report no more than moderate difficulty washing back with RUE using adaptive strategies or AD as needed.  Baseline: severe difficulty Goal status: MET  3.  Pt will report RUE as primary hand for completion of at least 3 daily activities. Baseline: only uses R hand with TV  remote 04/16/2023: Uses hand to hold and open coke bottles, open mail, use remote, use vacuum Goal status: MET  4.  Pt will independently recall the 5 main sensory precautions (cold, heat, sharp, chemical, and heavy) as needed to prevent injury/harm secondary to impairments.   Baseline: new to outpt OT 04/11/23 - Pt verbalized  3 out of 5 precautions ind, and remaining 2 out of 5 with min v/c. Goal status: MET   LONG TERM GOALS: Target date: 05/03/2023  Patient will demonstrate updated RUE HEP with 25% verbal cues or less for proper execution. Baseline:  Goal status: MET  2.  Pt will report use of RUE >50% of time vs non-dominant LUE. Baseline: less than 50% of the time Goal status: MET  3.  Patient will demonstrate at least 30 lbs R grip strength as needed to open jars and other containers. Baseline: 19.4 lbs 04/04/23 - 44.3 lbs 04/16/2023: 70 lbs Goal status: 04/04/23 - MET  4.  Pt will be able to open a tight or new jar with no more than moderate difficulty reported.  Baseline: unable Goal status: MET  ASSESSMENT:  CLINICAL IMPRESSION: Pt demonstrated excellent progress towards established goals with d/c agreed upon today.  PERFORMANCE DEFICITS: in functional skills including ADLs, IADLs, coordination, sensation, strength, Fine motor control, and UE functional use.   IMPAIRMENTS: are limiting patient from ADLs, IADLs, and leisure.   CO-MORBIDITIES: may have co-morbidities  that affects occupational performance. Patient will benefit from skilled OT to address above impairments and improve overall function.  REHAB POTENTIAL: Good  PLAN:  OCCUPATIONAL THERAPY DISCHARGE SUMMARY  Visits from Start of Care: 8  Current functional level related to goals / functional outcomes: Pt has met all goals (4/4 short term and 4/4 long term goals) to satisfactory levels and is pleased with outcomes.   Remaining deficits: Pt has no more significant functional deficits or pain.    Education / Equipment: Pt has all needed materials and education. Pt understands how to continue on with self-management. See tx notes for more details.   Patient agrees to discharge due to max benefits received from outpatient occupational therapy /  hand therapy at this time.   Victorino Sparrow, OT 04/25/2023, 4:33 PM

## 2023-04-25 NOTE — Therapy (Signed)
OUTPATIENT PHYSICAL THERAPY NEURO TREATMENT   Patient Name: Jeff [name] MRN: [id number] DOB:[birth year], 72 y.o., male Today's Date: 04/25/2023   PCP: Anson Fret, MD  REFERRING PROVIDER: Ihor Austin, NP  END OF SESSION:  PT End of Session - 04/25/23 1847     Visit Number 7    Number of Visits 13   plus eval   Date for PT Re-Evaluation 05/17/23   to allow for scheduling delays   Authorization Type [employer]IC[employer] CHOICE PPO    Authorization Time Period 10-17 - 05-17-23    Authorization - Visit Number 7    Authorization - Number of Visits 13    PT Start Time 1403    PT Stop Time 1445    PT Time Calculation (min) 42 min    Equipment Utilized During Treatment Gait belt    Activity Tolerance Patient tolerated treatment well   mild dyspnea experienced   Behavior During Therapy WFL for tasks assessed/performed                   Past Medical History:  Diagnosis Date   ADHD (attention deficit hyperactivity disorder)    Diabetes mellitus    Hyperlipidemia    Hypertension    Renal disorder    Seizures (HCC)    Stroke [facility])    Past Surgical History:  Procedure Laterality Date   IR ANGIO INTRA EXTRACRAN SEL COM CAROTID INNOMINATE BILAT MOD SED  01/03/2021   IR ANGIO VERTEBRAL SEL SUBCLAVIAN INNOMINATE UNI L MOD SED  01/03/2021   IR ANGIO VERTEBRAL SEL VERTEBRAL UNI R MOD SED  01/03/2021   IR RADIOLOGIST EVAL & MGMT  01/13/2021   IR US GUIDE VASC ACCESS RIGHT  01/03/2021   NECK SURGERY     SPINE SURGERY     Cervical spine x 2; Vear Clock; Critzer.   Patient Active Problem List   Diagnosis Date Noted   Benign essential hypertension 11/20/2022   Hypomagnesemia 11/20/2022   Falls 11/20/2022   Obstructive sleep apnea (adult) (pediatric) 11/20/2022   Syncope and collapse 11/20/2022   Uncontrolled type 2 diabetes mellitus with hypoglycemia, with long-term current use of insulin (HCC) 11/20/2022   Acute encephalopathy 11/20/2022   Hypoglycemia 11/20/2022   TIA  (transient ischemic attack) 03/10/2020   CKD (chronic kidney disease) 08/28/2018   Retinopathy of both eyes 08/28/2018   Stroke due to embolism of left vertebral artery (HCC) 08/12/2018   Stroke (HCC) 08/08/2018   Diabetes mellitus (HCC) 07/25/2015   Neck injury 06/22/2015   Hearing loss in right ear 10/14/2011   Agent orange exposure 10/14/2011   ADD (attention deficit disorder) 10/14/2011   Hyperlipidemia 10/14/2011   BMI 28.0-28.9,adult 10/14/2011    ONSET DATE: 03/04/2023 (referral date)  REFERRING DIAG: G81.91 (ICD-10-CM) - Right hemiparesis (HCC)  THERAPY DIAG:  Unsteadiness on feet  Other abnormalities of gait and mobility  Muscle weakness (generalized)  Rationale for Evaluation and Treatment: Rehabilitation  SUBJECTIVE:  SUBJECTIVE STATEMENT:  Pt reports no changes or problems since Tuesday and no falls.  Pt does state that his blood sugar is elevated today (336 at start of session) - says he can't get it to go down- has brought bottle of water with him again today   Pt accompanied by: family member: wife, [name]  PERTINENT HISTORY: hx of left cerebellum stroke (March 2020) with residual imbalance/gait impairment and right-sided ataxia, TIA (July 2022), transient AMS possibly in setting of hypoglycemia, left-sided headaches, hypertension, hyperlipidemia, diabetes mellitus, h/o cervical fusion C5-7 (2012), h/o neck injury 2016, stage 3 CKD, and intracranial stenosis   PAIN:  Are you having pain? Yes - headache - moderate intensity at start of   PRECAUTIONS: Fall  RED FLAGS: Bowel or bladder incontinence: No   WEIGHT BEARING RESTRICTIONS: No  FALLS: Has patient fallen in last 6 months? Yes. Number of falls "Too many to remember": "in the last 2 weeks I fell once", has not gone to  hospital for any of these falls, has hit head with one- "fell out of bed and hit head on nightstand" needed help from wife to get away from the bed but stood on own. Had fall in bathroom. Not typically falling when walking, but getting up from sitting. No reports of dizziness or lightheadedness.   LIVING ENVIRONMENT: Lives with: lives with their spouse Lives in: House/apartment Stairs: Yes: External: 1 to porch, 1 to foyer steps; none can use ramp at back of house Has following equipment at home:  "I've got it all"  PLOF: Needs assistance with ADLs- some dressing like little buttons on shirt sleeves or cufflinks, has not driven in over 2 years  PATIENT GOALS: "walk good", "get back to as normal as I can"  OBJECTIVE:  Note: Objective measures were completed at Evaluation unless otherwise noted.  DIAGNOSTIC FINDINGS:   MRI HEAD WITHOUT AND WITH CONTRAST (11/21/2022) IMPRESSION: 1. No acute intracranial abnormality or mass. No specific findings to explain seizures. 2. Unchanged moderate chronic small-vessel disease and central pattern of volume loss.  04/08/2023  5:20 PM repeat MRI WO Contrast   SENSATION: R hand "has gone to sleep", feels like this all the time, pt feels that it is colder than the L , impaired sensation on the top of both feet  EDEMA:  "Feet will swell every once in a while"  LOWER EXTREMITY MMT:    MMT Right Eval Left Eval  Hip flexion 3 4-  Hip extension    Hip abduction    Hip adduction    Hip internal rotation    Hip external rotation    Knee flexion 3 5  Knee extension 3 5  Ankle dorsiflexion 3+ 5  Ankle plantarflexion    Ankle inversion    Ankle eversion    (Blank rows = not tested)   STAIRS: Level of Assistance: CGA and Min A Stair Negotiation Technique: Step to Pattern with Bilateral Rails Number of Stairs: 4  Height of Stairs: standard  Comments: pt w/ increased difficulty descending requiring Min A for balance "I'm not sure I could have  done that without your help"  GAIT: Gait pattern: step through pattern, decreased arm swing- Right, decreased arm swing- Left, decreased step length- Right, decreased step length- Left, decreased hip/knee flexion- Right, decreased ankle dorsiflexion- Right, decreased ankle dorsiflexion- Left, trunk flexed, poor foot clearance- Right, and poor foot clearance- Left Distance walked: various clinical distances Assistive device utilized: None Level of assistance: SBA Comments: consider assessing w/  ADs   TODAY'S TREATMENT:                                                                                                                               Blood Sugar:  336 at start of session  There were no vitals filed for this visit.     TherEx: Sit to stand from mat 5 reps with Lt foot on 4" step for increased weight bearing and increased use of RLE with sit to stand transfer for strengthening - CGA   Step up exercise RLE onto 4" step with 2# weight on RLE - step up with LLE - lift RLE up to 90 degree hip flexion and then return to floor - 10 reps  Mild dyspnea observed and reported - O2 96%, HR 96 beats/min;  pt given approx. 1" seated rest break - able to continue with exercises  Bil. Heel raises 10 reps with min. UE support on // bar  Lateral step up RLE onto 4" step with 2# weight on RLE - 10 reps with CGA for Rt hip abductor strengthening  TherAct:  Resisted ambulation with sports cord with perturbations to improve balance and balance recovery; performed forwards/backwards ambulation 10' x 4 reps inside // bars; performed sideways amb. Inside // bars 4 reps  with moderate perturbations   NeuroRe-ed:   Pt performed forwards amb. Tossing and catching medium sized ball approx. 40' x 2 reps; 1st rep - tossing and catching ball straight up; 2nd rep- tossing/catching ball on Rt side/Lt side approx. 25' only due to c/o dizziness with this activity - pt needed seated rest break afterward  Pt  performed stepping over/back of black balance beam inside // bars - 5 reps RLE and then LLE - standing on floor; performed lateral stepping over balance beam - 5 reps each leg standing on floor inside // bars without UE support with CGA Progressed to standing on Airex inside // bars - stepped over/back of black balance beam 5 reps RLE and LLE to improve SLS on compliant surface on each leg - intermittent UE support on // bar needed for balance recovery due to LOB with this activity  Pt performed kicking 3 bean bags - alternating feet - approx. 20' x 1 rep to improve SLS on each leg; amb. Forward on blue mat on floor with CGA x 2 reps  SLS activity - pt stood by mat but did not use for balance - placed Rt foot on bean bag on floor - made circles CW 5 reps, CCW 5 reps; performed same exercise with LLE 5 reps each direction; - CGA for safety but pt had no major LOB   Gait:      Pt performed ambulation with horizontal head turns approx. 35' x 2 reps;  amb. With vertical head turns 35' x 1 rep Gait pattern: step through pattern, decreased arm swing- Right, decreased arm swing- Left, decreased step length- Right, decreased step  length- Left, decreased hip/knee flexion- Right, decreased ankle dorsiflexion- Right, decreased ankle dorsiflexion- Left, trunk flexed, poor foot clearance- Right, and poor foot clearance- Left Distance walked: various clinical distances Assistive device utilized: None Level of assistance: SBA  PATIENT EDUCATION:  Education details: POC, continue HEP, outcome measures, STG results Person educated: Patient and Spouse Education method: Explanation Education comprehension: verbalized understanding and needs further education  HOME EXERCISE PROGRAM:  To be initiated Access Code: [email or web address] URL: [email or web address] Date: 03/29/2023 Prepared by: Beverely Low  Exercises - Standing Balance in Corner  - 1 x daily - 7 x weekly - 3 sets - 10 reps - Standing Balance  in Corner with Eyes Closed  - 1 x daily - 7 x weekly - 3 sets - 10 reps  GOALS: Goals reviewed with patient? No  SHORT TERM GOALS: Target date: 04/11/2023   Pt will improve FGA to at least 10/30 (MCID 4, Fall risk <22) for improved dynamic balance and safety.   Baseline: 6/30 (10/17), 12/30 (04/11/23) Goal status: MET  2.  Pt will be independent with initial HEP for improved strength, balance, transfers and gait.  Baseline:  Goal status: MET  3.  Pt will improve gait velocity to at least 2.50 ft/sec for improved gait efficiency and safety in the home Baseline: 1.93 ft/sec (03/21/23), 2.10 ft/sec (04/11/23) Goal status: PROGRESSING  4.  Pt will hold romberg stance for 30 seconds with eyes open on solid ground with no LOB and SBA. Baseline: could not perform, met (04/11/23) Goal status: MET   LONG TERM GOALS: Target date: 05/02/2023  Pt will improve FGA to at least 14/30 (MCID 4, Fall risk <22) for improved dynamic balance and safety.   Baseline: 6/30 (03/21/23), 12/30 (04/11/23) Goal status: INITIAL  2.  Pt will be independent with final HEP for improved strength, balance, transfers and gait.  Baseline:  Goal status: INITIAL  3.  Pt will improve gait velocity to at least 2.50 ft/sec (norm) for improved gait efficiency and safety in the community  Baseline: 1.93 ft/sec (03/21/23), 2.10 ft/sec (04/11/23) Goal status: REVISED  4.  Pt will hold romberg stance for 30 seconds with eyes closed on solid ground with no LOB and SBA. Baseline: could hold eyes open (04/11/23) Goal status: INITIAL   ASSESSMENT:  CLINICAL IMPRESSION:  PT session focused on RLE strengthening exercises with use of 2# weight for hip flexor & abductor strengthening.  Pt reported dyspnea with sit to stand exercise with Lt foot on 4" step, possibly due to this exercise being performed after resisted amb. With sports cord.  O2 was 96% and HR was 96%.  Pt able to resume exercises after short seated rest break.  Pt's  balance is improving overall - pt continues to have decreased Rt hip and knee flexion in swing phase of gait which worsens with fatigue.  Pt had no major LOB in today's PT session.  Continue POC.   OBJECTIVE IMPAIRMENTS: Abnormal gait, decreased activity tolerance, decreased balance, decreased endurance, decreased knowledge of use of DME, decreased mobility, difficulty walking, decreased ROM, decreased strength, decreased safety awareness, dizziness, hypomobility, impaired flexibility, and impaired sensation.   ACTIVITY LIMITATIONS: lifting, bending, squatting, stairs, transfers, bathing, dressing, and hygiene/grooming  PARTICIPATION LIMITATIONS: meal prep, cleaning, laundry, driving, shopping, community activity, occupation, and yard work  PERSONAL FACTORS: Age, Time since onset of injury/illness/exacerbation, and 3+ comorbidities:    hx left cerebellum stroke, TIA (July 2022), transient AMS possibly in setting of hypoglycemia, left-sided headaches, hypertension, hyperlipidemia, diabetes  mellitus, h/o cervical fusion C5-7 (2012), h/o neck injury 2016, stage 3 CKD, and intracranial stenosis are also affecting patient's functional outcome.   REHAB POTENTIAL: Fair original stroke was in 2020, potentially had a new stroke, balance and gait have declined since last seen at this clinic  CLINICAL DECISION MAKING: Evolving/moderate complexity  EVALUATION COMPLEXITY: Moderate  PLAN:   PT FREQUENCY: 2x/week  PT DURATION: 6 weeks  PLANNED INTERVENTIONS: 97164- PT Re-evaluation, 97110-Therapeutic exercises, 97530- Therapeutic activity, 97112- Neuromuscular re-education, 97535- Self Care, 96045- Manual therapy, [phone]- Gait training, [phone]- Canalith repositioning, 97014- Electrical stimulation (unattended), [phone]- Electrical stimulation (manual), Patient/Family education, Balance training, Stair training, Taping, Dry Needling, Joint mobilization, Joint manipulation, Spinal manipulation, Spinal mobilization,  Vestibular training, DME instructions, Cryotherapy, and Moist heat  PLAN FOR NEXT SESSION: Start checking LTG's (due date 11-28); pt requested to add on visits til end of cert period (thru 05-17-23) due to cancelled visits  - 4 visits added Cont balance and gait training -- RLE strengthening  Review exercises for HEP for RLE & core strengthening, balance, gait training, endurance; assess if RW improves gait,   very unsafe w/ most dynamic gait challenges: guard closely, clamshells for R, posterior pelvic tilt+bridge, marching, bent knee fall outs, sit-to-stand variations, counter support- marches, extension, abduction, heel raises, standing balance progress towards tandem stance, per pt "I need to build up my endurance" Hx of seizures, no blaze pods, ask about blood glucose (low)  Louana Fontenot, Donavan Burnet, PT 04/25/2023, 6:52 PM

## 2023-04-25 NOTE — Patient Instructions (Signed)
Access Code: [email or web address] URL: [email or web address] Date: 04/25/2023 Prepared by: Amada Kingfisher  Exercises - Single Arm Shoulder Extension with Anchored Resistance  - 1 x daily - 5-10 reps

## 2023-04-29 ENCOUNTER — Ambulatory Visit: Payer: Medicare PPO | Admitting: Physical Therapy

## 2023-04-29 ENCOUNTER — Encounter: Payer: No Typology Code available for payment source | Admitting: Occupational Therapy

## 2023-04-29 VITALS — BP 116/72 | HR 84

## 2023-04-29 DIAGNOSIS — R208 Other disturbances of skin sensation: Secondary | ICD-10-CM

## 2023-04-29 DIAGNOSIS — I69351 Hemiplegia and hemiparesis following cerebral infarction affecting right dominant side: Secondary | ICD-10-CM

## 2023-04-29 DIAGNOSIS — R29818 Other symptoms and signs involving the nervous system: Secondary | ICD-10-CM

## 2023-04-29 DIAGNOSIS — R2689 Other abnormalities of gait and mobility: Secondary | ICD-10-CM | POA: Diagnosis not present

## 2023-04-29 DIAGNOSIS — R2681 Unsteadiness on feet: Secondary | ICD-10-CM | POA: Diagnosis not present

## 2023-04-29 DIAGNOSIS — M6281 Muscle weakness (generalized): Secondary | ICD-10-CM

## 2023-04-29 DIAGNOSIS — R293 Abnormal posture: Secondary | ICD-10-CM | POA: Diagnosis not present

## 2023-04-29 DIAGNOSIS — R278 Other lack of coordination: Secondary | ICD-10-CM | POA: Diagnosis not present

## 2023-04-29 NOTE — Therapy (Signed)
OUTPATIENT PHYSICAL THERAPY NEURO TREATMENT   Patient Name: Jeff [name] MRN: [id number] DOB:01-Feb-1951, 72 y.o., male Today's Date: 04/29/2023   PCP: Anson Fret, MD  REFERRING PROVIDER: Ihor Austin, NP  END OF SESSION:  PT End of Session - 04/29/23 1450     Visit Number 8    Number of Visits 13   plus eval   Date for PT Re-Evaluation 05/17/23   to allow for scheduling delays   Authorization Type [employer]IC[employer] CHOICE PPO    Authorization Time Period 10-17 - 05-17-23    Authorization - Number of Visits 13    PT Start Time 1451    PT Stop Time 1528    PT Time Calculation (min) 37 min    Equipment Utilized During Treatment Gait belt    Activity Tolerance Patient tolerated treatment well    Behavior During Therapy WFL for tasks assessed/performed                    Past Medical History:  Diagnosis Date   ADHD (attention deficit hyperactivity disorder)    Diabetes mellitus    Hyperlipidemia    Hypertension    Renal disorder    Seizures (HCC)    Stroke (HCC)    Past Surgical History:  Procedure Laterality Date   IR ANGIO INTRA EXTRACRAN SEL COM CAROTID INNOMINATE BILAT MOD SED  01/03/2021   IR ANGIO VERTEBRAL SEL SUBCLAVIAN INNOMINATE UNI L MOD SED  01/03/2021   IR ANGIO VERTEBRAL SEL VERTEBRAL UNI R MOD SED  01/03/2021   IR RADIOLOGIST EVAL & MGMT  01/13/2021   IR US GUIDE VASC ACCESS RIGHT  01/03/2021   NECK SURGERY     SPINE SURGERY     Cervical spine x 2; Vear Clock; Critzer.   Patient Active Problem List   Diagnosis Date Noted   Benign essential hypertension 11/20/2022   Hypomagnesemia 11/20/2022   Falls 11/20/2022   Obstructive sleep apnea (adult) (pediatric) 11/20/2022   Syncope and collapse 11/20/2022   Uncontrolled type 2 diabetes mellitus with hypoglycemia, with long-term current use of insulin (HCC) 11/20/2022   Acute encephalopathy 11/20/2022   Hypoglycemia 11/20/2022   TIA (transient ischemic attack) 03/10/2020   CKD (chronic kidney  disease) 08/28/2018   Retinopathy of both eyes 08/28/2018   Stroke due to embolism of left vertebral artery (HCC) 08/12/2018   Stroke (HCC) 08/08/2018   Diabetes mellitus (HCC) 07/25/2015   Neck injury 06/22/2015   Hearing loss in right ear 10/14/2011   Agent orange exposure 10/14/2011   ADD (attention deficit disorder) 10/14/2011   Hyperlipidemia 10/14/2011   BMI 28.0-28.9,adult 10/14/2011    ONSET DATE: 03/04/2023 (referral date)  REFERRING DIAG: G81.91 (ICD-10-CM) - Right hemiparesis (HCC)  THERAPY DIAG:  Muscle weakness (generalized)  Other disturbances of skin sensation  Unsteadiness on feet  Other abnormalities of gait and mobility  Other symptoms and signs involving the nervous system  Hemiplegia and hemiparesis following cerebral infarction affecting right dominant side (HCC)  Abnormal posture  Rationale for Evaluation and Treatment: Rehabilitation  SUBJECTIVE:  SUBJECTIVE STATEMENT:  Denies falls or acute changes. "I've been doing good, I've been working out, [employer], chasing [name] around the house..." Pt ate before coming. Pt will be going to [facility] after therapy on Wednesday this week.   "My brothers a gamecock fan, I keep praying for him but nothing changes"   Pt accompanied by: family member: wife, [name]  PERTINENT HISTORY: hx of left cerebellum stroke (March 2020) with residual imbalance/gait impairment and right-sided ataxia, TIA (July 2022), transient AMS possibly in setting of hypoglycemia, left-sided headaches, hypertension, hyperlipidemia, diabetes mellitus, h/o cervical fusion C5-7 (2012), h/o neck injury 2016, stage 3 CKD, and intracranial stenosis   PAIN:  Are you having pain? No   PRECAUTIONS: Fall  RED FLAGS: Bowel or bladder incontinence:  No   WEIGHT BEARING RESTRICTIONS: No  FALLS: Has patient fallen in last 6 months? Yes. Number of falls "Too many to remember": "in the last 2 weeks I fell once", has not gone to hospital for any of these falls, has hit head with one- "fell out of bed and hit head on nightstand" needed help from wife to get away from the bed but stood on own. Had fall in bathroom. Not typically falling when walking, but getting up from sitting. No reports of dizziness or lightheadedness.   LIVING ENVIRONMENT: Lives with: lives with their spouse Lives in: House/apartment Stairs: Yes: External: 1 to porch, 1 to foyer steps; none can use ramp at back of house Has following equipment at home:  "I've got it all"  PLOF: Needs assistance with ADLs- some dressing like little buttons on shirt sleeves or cufflinks, has not driven in over 2 years  PATIENT GOALS: "walk good", "get back to as normal as I can"  OBJECTIVE:  Note: Objective measures were completed at Evaluation unless otherwise noted.  DIAGNOSTIC FINDINGS:   MRI HEAD WITHOUT AND WITH CONTRAST (11/21/2022) IMPRESSION: 1. No acute intracranial abnormality or mass. No specific findings to explain seizures. 2. Unchanged moderate chronic small-vessel disease and central pattern of volume loss.  04/08/2023  5:20 PM repeat MRI WO Contrast   SENSATION: R hand "has gone to sleep", feels like this all the time, pt feels that it is colder than the L , impaired sensation on the top of both feet  EDEMA:  "Feet will swell every once in a while"  LOWER EXTREMITY MMT:    MMT Right Eval Left Eval  Hip flexion 3 4-  Hip extension    Hip abduction    Hip adduction    Hip internal rotation    Hip external rotation    Knee flexion 3 5  Knee extension 3 5  Ankle dorsiflexion 3+ 5  Ankle plantarflexion    Ankle inversion    Ankle eversion    (Blank rows = not tested)   STAIRS: Level of Assistance: CGA and Min A Stair Negotiation Technique: Step to  Pattern with Bilateral Rails Number of Stairs: 4  Height of Stairs: standard  Comments: pt w/ increased difficulty descending requiring Min A for balance "I'm not sure I could have done that without your help"  GAIT: Gait pattern: step through pattern, decreased arm swing- Right, decreased arm swing- Left, decreased step length- Right, decreased step length- Left, decreased hip/knee flexion- Right, decreased ankle dorsiflexion- Right, decreased ankle dorsiflexion- Left, trunk flexed, poor foot clearance- Right, and poor foot clearance- Left Distance walked: various clinical distances Assistive device utilized: None Level of assistance: SBA Comments: consider assessing w/ ADs  TODAY'S TREATMENT:                                                                                                                    TherAct Blood Sugar:  202 at start of session    192 ~3:14pm  Vitals:   04/29/23 1456  BP: 116/72  Pulse: 84  RUE, seated   [facility] PT Assessment - 04/29/23 1458       Functional Gait  Assessment   Gait assessed  Yes    Gait Level Surface Walks 20 ft in less than 7 sec but greater than 5.5 sec, uses assistive device, slower speed, mild gait deviations, or deviates 6-10 in outside of the 12 in walkway width.   6.9   Change in Gait Speed Able to smoothly change walking speed without loss of balance or gait deviation. Deviate no more than 6 in outside of the 12 in walkway width.    Gait with Horizontal Head Turns Performs head turns smoothly with slight change in gait velocity (eg, minor disruption to smooth gait path), deviates 6-10 in outside 12 in walkway width, or uses an assistive device.    Gait with Vertical Head Turns Performs task with slight change in gait velocity (eg, minor disruption to smooth gait path), deviates 6 - 10 in outside 12 in walkway width or uses assistive device    Gait and Pivot Turn Pivot turns safely within 3 sec and stops quickly with no loss of balance.    1.5 sec   Step Over Obstacle Is able to step over 2 stacked shoe boxes taped together (9 in total height) without changing gait speed. No evidence of imbalance.    Gait with Narrow Base of Support Ambulates less than 4 steps heel to toe or cannot perform without assistance.   attained position, took ~3 steps w/ Min A   Gait with Eyes Closed Walks 20 ft, slow speed, abnormal gait pattern, evidence for imbalance, deviates 10-15 in outside 12 in walkway width. Requires more than 9 sec to ambulate 20 ft.    Ambulating Backwards Walks 20 ft, uses assistive device, slower speed, mild gait deviations, deviates 6-10 in outside 12 in walkway width.    Steps Alternating feet, must use rail.    Total Score 20    FGA comment: medium fall risk              Forwards ambulation w/ ball toss/catch from random directions x369ft, CGA throughout for safety  Forwards ambulation w/ sideways ball passing to PT/SPT R and L x~28ft, CGA throughout for safety Pt distractable throughout this activity  Pt requesting to end session early.  PATIENT EDUCATION:  Education details: POC, continue HEP, outcome measures, LTG results Person educated: Patient and Spouse Education method: Explanation Education comprehension: verbalized understanding and needs further education  HOME EXERCISE PROGRAM:  Access Code: [email or web address] URL: [email or web address] Date: 03/29/2023 Prepared by: Beverely Low  Exercises - Standing Balance in Corner  - 1 x daily - 7 x  weekly - 3 sets - 10 reps - Standing Balance in Corner with Eyes Closed  - 1 x daily - 7 x weekly - 3 sets - 10 reps  GOALS: Goals reviewed with patient? No  SHORT TERM GOALS: Target date: 04/11/2023   Pt will improve FGA to at least 10/30 (MCID 4, Fall risk <22) for improved dynamic balance and safety.   Baseline: 6/30 (10/17), 12/30 (04/11/23) Goal status: MET  2.  Pt will be independent with initial HEP for improved strength, balance, transfers  and gait.  Baseline:  Goal status: MET  3.  Pt will improve gait velocity to at least 2.50 ft/sec for improved gait efficiency and safety in the home Baseline: 1.93 ft/sec (03/21/23), 2.10 ft/sec (04/11/23) Goal status: PROGRESSING  4.  Pt will hold romberg stance for 30 seconds with eyes open on solid ground with no LOB and SBA. Baseline: could not perform, met (04/11/23) Goal status: MET   LONG TERM GOALS: Target date: 05/02/2023  Pt will improve FGA to at least 14/30 (MCID 4, Fall risk <22) for improved dynamic balance and safety.   Baseline: 6/30 (03/21/23), 12/30 (04/11/23), 20/30 (04/29/23) Goal status: MET  2.  Pt will be independent with final HEP for improved strength, balance, transfers and gait.  Baseline:  Goal status: INITIAL  3.  Pt will improve gait velocity to at least 2.50 ft/sec (norm) for improved gait efficiency and safety in the community  Baseline: 1.93 ft/sec (03/21/23), 2.10 ft/sec (04/11/23) Goal status: REVISED  4.  Pt will hold romberg stance for 30 seconds with eyes closed on solid ground with no LOB and SBA. Baseline: could hold eyes open (04/11/23) Goal status: INITIAL   ASSESSMENT:  CLINICAL IMPRESSION:  Emphasis of skilled PT session on assessing FGA and dynamic balance challenges. Pt's FGA score improved to 20/30, meeting 1/1 LTGs assessed this date and demonstrating a decreased fall risk from evaluation. Pt w/ good walking balance but distractable during ball toss activity. Pt will continue to benefit from skilled PT to address impairments in balance, strength, and mobility. Continue POC.   OBJECTIVE IMPAIRMENTS: Abnormal gait, decreased activity tolerance, decreased balance, decreased endurance, decreased knowledge of use of DME, decreased mobility, difficulty walking, decreased ROM, decreased strength, decreased safety awareness, dizziness, hypomobility, impaired flexibility, and impaired sensation.   ACTIVITY LIMITATIONS: lifting, bending,  squatting, stairs, transfers, bathing, dressing, and hygiene/grooming  PARTICIPATION LIMITATIONS: meal prep, cleaning, laundry, driving, shopping, community activity, occupation, and yard work  PERSONAL FACTORS: Age, Time since onset of injury/illness/exacerbation, and 3+ comorbidities:    hx left cerebellum stroke, TIA (July 2022), transient AMS possibly in setting of hypoglycemia, left-sided headaches, hypertension, hyperlipidemia, diabetes mellitus, h/o cervical fusion C5-7 (2012), h/o neck injury 2016, stage 3 CKD, and intracranial stenosis are also affecting patient's functional outcome.   REHAB POTENTIAL: Fair original stroke was in 2020, potentially had a new stroke, balance and gait have declined since last seen at this clinic  CLINICAL DECISION MAKING: Evolving/moderate complexity  EVALUATION COMPLEXITY: Moderate  PLAN:   PT FREQUENCY: 2x/week  PT DURATION: 6 weeks  PLANNED INTERVENTIONS: 97164- PT Re-evaluation, 97110-Therapeutic exercises, 97530- Therapeutic activity, 97112- Neuromuscular re-education, 97535- Self Care, [id number]- Manual therapy, [id number]- Gait training, [phone]- Canalith repositioning, 97014- Electrical stimulation (unattended), [phone]- Electrical stimulation (manual), Patient/Family education, Balance training, Stair training, Taping, Dry Needling, Joint mobilization, Joint manipulation, Spinal manipulation, Spinal mobilization, Vestibular training, DME instructions, Cryotherapy, and Moist heat  PLAN FOR NEXT SESSION: LTG's (due date 11-28); pt requested to add  on visits til end of cert period (thru 05-17-23) due to cancelled visits  - 4 visits added Cont balance and gait training -- RLE strengthening  Review exercises for HEP for RLE & core strengthening, balance, gait training, endurance; assess if RW improves gait,   very unsafe w/ most dynamic gait challenges: guard closely, clamshells for R, posterior pelvic tilt+bridge, marching, bent knee fall outs, sit-to-stand  variations, counter support- marches, extension, abduction, heel raises, standing balance progress towards tandem stance, per pt "I need to build up my endurance" Hx of seizures, no blaze pods, ask about blood glucose (low)    Beverely Low, Student-PT 04/29/2023, 5:32 PM

## 2023-05-01 ENCOUNTER — Ambulatory Visit: Payer: Medicare PPO | Admitting: Physical Therapy

## 2023-05-01 ENCOUNTER — Encounter: Payer: No Typology Code available for payment source | Admitting: Occupational Therapy

## 2023-05-01 VITALS — BP 109/69 | HR 79

## 2023-05-01 DIAGNOSIS — M6281 Muscle weakness (generalized): Secondary | ICD-10-CM | POA: Diagnosis not present

## 2023-05-01 DIAGNOSIS — I69351 Hemiplegia and hemiparesis following cerebral infarction affecting right dominant side: Secondary | ICD-10-CM | POA: Diagnosis not present

## 2023-05-01 DIAGNOSIS — R2681 Unsteadiness on feet: Secondary | ICD-10-CM | POA: Diagnosis not present

## 2023-05-01 DIAGNOSIS — R208 Other disturbances of skin sensation: Secondary | ICD-10-CM

## 2023-05-01 DIAGNOSIS — R278 Other lack of coordination: Secondary | ICD-10-CM | POA: Diagnosis not present

## 2023-05-01 DIAGNOSIS — R293 Abnormal posture: Secondary | ICD-10-CM

## 2023-05-01 DIAGNOSIS — R2689 Other abnormalities of gait and mobility: Secondary | ICD-10-CM

## 2023-05-01 DIAGNOSIS — R29818 Other symptoms and signs involving the nervous system: Secondary | ICD-10-CM | POA: Diagnosis not present

## 2023-05-01 NOTE — Therapy (Addendum)
OUTPATIENT PHYSICAL THERAPY NEURO TREATMENT   Patient Name: Jeff [name] MRN: [id number] DOB:1950/08/17, 72 y.o., male Today's Date: 05/01/2023   PCP: Anson Fret, MD  REFERRING PROVIDER: Ihor Austin, NP  END OF SESSION:  PT End of Session - 05/01/23 1315     Visit Number 9    Number of Visits 13   plus eval   Date for PT Re-Evaluation 05/17/23   to allow for scheduling delays   Authorization Type [employer]IC[employer] CHO[employer] PPO    Authorization Time Period 10-17 - 05-17-23    Authorization - Number of Visits 13    PT Start Time 1316    PT Stop Time 1356    PT Time Calculation (min) 40 min    Equipment Utilized During Treatment Gait belt    Activity Tolerance Treatment limited secondary to medical complications (Comment)   absent seizure towards end of session   Behavior During Therapy [facility] for tasks assessed/performed                     Past Medical History:  Diagnosis Date   ADHD (attention deficit hyperactivity disorder)    Diabetes mellitus    Hyperlipidemia    Hypertension    Renal disorder    Seizures (HCC)    Stroke [facility])    Past Surgical History:  Procedure Laterality Date   IR ANGIO INTRA EXTRACRAN SEL COM CAROTID INNOMINATE BILAT MOD SED  01/03/2021   IR ANGIO VERTEBRAL SEL SUBCLAVIAN INNOMINATE UNI L MOD SED  01/03/2021   IR ANGIO VERTEBRAL SEL VERTEBRAL UNI R MOD SED  01/03/2021   IR RADIOLOGIST EVAL & MGMT  01/13/2021   IR US GUIDE VASC ACCESS RIGHT  01/03/2021   NECK SURGERY     SPINE SURGERY     Cervical spine x 2; Vear Clock; Critzer.   Patient Active Problem List   Diagnosis Date Noted   Benign essential hypertension 11/20/2022   Hypomagnesemia 11/20/2022   Falls 11/20/2022   Obstructive sleep apnea (adult) (pediatric) 11/20/2022   Syncope and collapse 11/20/2022   Uncontrolled type 2 diabetes mellitus with hypoglycemia, with long-term current use of insulin (HCC) 11/20/2022   Acute encephalopathy 11/20/2022   Hypoglycemia 11/20/2022    TIA (transient ischemic attack) 03/10/2020   CKD (chronic kidney disease) 08/28/2018   Retinopathy of both eyes 08/28/2018   Stroke due to embolism of left vertebral artery (HCC) 08/12/2018   Stroke (HCC) 08/08/2018   Diabetes mellitus (HCC) 07/25/2015   Neck injury 06/22/2015   Hearing loss in right ear 10/14/2011   Agent orange exposure 10/14/2011   ADD (attention deficit disorder) 10/14/2011   Hyperlipidemia 10/14/2011   BMI 28.0-28.9,adult 10/14/2011    ONSET DATE: 03/04/2023 (referral date)  REFERRING DIAG: G81.91 (ICD-10-CM) - Right hemiparesis (HCC)  THERAPY DIAG:  Muscle weakness (generalized)  Other disturbances of skin sensation  Unsteadiness on feet  Other abnormalities of gait and mobility  Hemiplegia and hemiparesis following cerebral infarction affecting right dominant side (HCC)  Abnormal posture  Rationale for Evaluation and Treatment: Rehabilitation  SUBJECTIVE:  SUBJECTIVE STATEMENT:  "She's doing good and I'm good too" about him and his wife. Denies falls and acute changes.  Pt accompanied by: family member: wife, [name]  PERTINENT HISTORY: hx of left cerebellum stroke (March 2020) with residual imbalance/gait impairment and right-sided ataxia, TIA (July 2022), transient AMS possibly in setting of hypoglycemia, left-sided headaches, hypertension, hyperlipidemia, diabetes mellitus, h/o cervical fusion C5-7 (2012), h/o neck injury 2016, stage 3 CKD, and intracranial stenosis   PAIN:  Are you having pain? No   PRECAUTIONS: Fall  RED FLAGS: Bowel or bladder incontinence: No   WEIGHT BEARING RESTRICTIONS: No  FALLS: Has patient fallen in last 6 months? Yes. Number of falls "Too many to remember": "in the last 2 weeks I fell once", has not gone to hospital for any of  these falls, has hit head with one- "fell out of bed and hit head on nightstand" needed help from wife to get away from the bed but stood on own. Had fall in bathroom. Not typically falling when walking, but getting up from sitting. No reports of dizziness or lightheadedness.   LIVING ENVIRONMENT: Lives with: lives with their spouse Lives in: House/apartment Stairs: Yes: External: 1 to porch, 1 to foyer steps; none can use ramp at back of house Has following equipment at home:  "I've got it all"  PLOF: Needs assistance with ADLs- some dressing like little buttons on shirt sleeves or cufflinks, has not driven in over 2 years  PATIENT GOALS: "walk good", "get back to as normal as I can"  OBJECTIVE:  Note: Objective measures were completed at Evaluation unless otherwise noted.  DIAGNOSTIC FINDINGS:   MRI HEAD WITHOUT AND WITH CONTRAST (11/21/2022) IMPRESSION: 1. No acute intracranial abnormality or mass. No specific findings to explain seizures. 2. Unchanged moderate chronic small-vessel disease and central pattern of volume loss.  04/08/2023  5:20 PM repeat MRI WO Contrast   SENSATION: R hand "has gone to sleep", feels like this all the time, pt feels that it is colder than the L , impaired sensation on the top of both feet  EDEMA:  "Feet will swell every once in a while"  LOWER EXTREMITY MMT:    MMT Right Eval Left Eval  Hip flexion 3 4-  Hip extension    Hip abduction    Hip adduction    Hip internal rotation    Hip external rotation    Knee flexion 3 5  Knee extension 3 5  Ankle dorsiflexion 3+ 5  Ankle plantarflexion    Ankle inversion    Ankle eversion    (Blank rows = not tested)   STAIRS: Level of Assistance: CGA and Min A Stair Negotiation Technique: Step to Pattern with Bilateral Rails Number of Stairs: 4  Height of Stairs: standard  Comments: pt w/ increased difficulty descending requiring Min A for balance "I'm not sure I could have done that without  your help"  GAIT: Gait pattern: step through pattern, decreased arm swing- Right, decreased arm swing- Left, decreased step length- Right, decreased step length- Left, decreased hip/knee flexion- Right, decreased ankle dorsiflexion- Right, decreased ankle dorsiflexion- Left, trunk flexed, poor foot clearance- Right, and poor foot clearance- Left Distance walked: various clinical distances Assistive device utilized: None Level of assistance: SBA Comments: consider assessing w/ ADs   TODAY'S TREATMENT:  TherAct Blood Sugar: 273 via personal monitor 1:20pm   260 via personal monitor, but wife reads 307 w/ finger prick 1:48 pm  Vitals:   05/01/23 1321  BP: 109/69  Pulse: 79  RUE, seated, at beginning of session  LTG assessment Gait speed: 3.95 ft/sec Romberg stance eyes closed x30 seconds, close SBA   NMR Tandem stance eyes closed, performed bilaterally ~30 seconds, CGA Pt demonstrates increased sway to anterior R, bumping into wall several times  Tandem walk w/ counter support, verbally added to HEP, CGA Pt reports wanting to be able to return to this task from [employer] days  X2 laps of cone weaving, with cones spaced ~2 ft apart, CGA throughout for safety stepping forwards, to the right, backwards, to the right x10 cones, then forwards, to the left, backwards, to the left x10 cones  Verbal cues for large step size, especially forwards and backwards  Absent Seizure At completion of cone ambulation activity, during seated rest break pt reported lightheadedness Pt checked blood glucose using a personal monitor, which read at 260. Pt drank water while seated. Pt then began to experience ~1 minute long seizure episode characterized by unresponsiveness verbally and to tactile stimulus. Wife measured blood glucose at 307 using a finger prick during the episode PT and wife  provided CGA throughout the episode for safety No injury occurred during the event, pt remained safely seated on the edge of the mat table Reassured pt at completion of seizure that he is safe and informed him of what occurred during his absent state  [facility] PT Assessment - 05/01/23 1440       Ambulation/Gait   Gait velocity 3.95 ft/sec             PATIENT EDUCATION:  Education details: POC, continue HEP, outcome measures, LTG results Person educated: Patient and Spouse Education method: Explanation Education comprehension: verbalized understanding and needs further education  HOME EXERCISE PROGRAM:  Access Code: [email or web address] URL: [email or web address] Date: 03/29/2023 Prepared by: Beverely Low  Exercises - Standing Balance in Corner  - 1 x daily - 7 x weekly - 3 sets - 10 reps - Standing Balance in Corner with Eyes Closed  - 1 x daily - 7 x weekly - 3 sets - 10 reps  GOALS: Goals reviewed with patient? No  SHORT TERM GOALS: Target date: 04/11/2023   Pt will improve FGA to at least 10/30 (MCID 4, Fall risk <22) for improved dynamic balance and safety.   Baseline: 6/30 (10/17), 12/30 (04/11/23) Goal status: MET  2.  Pt will be independent with initial HEP for improved strength, balance, transfers and gait.  Baseline:  Goal status: MET  3.  Pt will improve gait velocity to at least 2.50 ft/sec for improved gait efficiency and safety in the home Baseline: 1.93 ft/sec (03/21/23), 2.10 ft/sec (04/11/23) Goal status: PROGRESSING  4.  Pt will hold romberg stance for 30 seconds with eyes open on solid ground with no LOB and SBA. Baseline: could not perform, met (04/11/23) Goal status: MET   LONG TERM GOALS: Target date: 05/02/2023   Pt will improve FGA to at least 14/30 (MCID 4, Fall risk <22) for improved dynamic balance and safety.   Baseline: 6/30 (03/21/23), 12/30 (04/11/23), 20/30 (04/29/23) Goal status: MET  2.  Pt will be independent with final HEP for  improved strength, balance, transfers and gait.  Baseline: "they're good, I didn't do any in the shower today because we were running low on time" Goal status: MET  3.  Pt will improve gait velocity to at least 2.50 ft/sec (norm) for improved gait efficiency and safety in the community  Baseline: 1.93 ft/sec (03/21/23), 2.10 ft/sec (04/11/23), 3.95 ft/sec Goal status: MET  4.  Pt will hold romberg stance for 30 seconds with eyes closed on solid ground with no LOB and SBA. Baseline: could hold eyes open (04/11/23), met w/ close SBA (05/01/23) Goal status: MET  NEW LONG TERM GOALS: Target date: 05/17/2023 (last day of POC)  Pt will improve FGA to at least 24/30 (MCID 4, Fall risk <22) for improved dynamic balance and safety.   Baseline: 6/30 (03/21/23), 12/30 (04/11/23), 20/30 (04/29/23) Goal status: INITIAL  2.  Trial MCTSIB conditions 3 and 4 and set goal as appropriate  Baseline:  Goal status: INITIAL   ASSESSMENT:  CLINICAL IMPRESSION:  Emphasis of skilled PT session on assessing LTGs, challenging static and dynamic balance. Pt has met 3/3 LTGs assessed this date. Pt demonstrates dramatically improved gait speed and ability to perform narrow stance w/ eyes closed; 10 MWT improved to 3.95 ft/sec and pt was able to maintain condition 2 of mCTSIB for 30 seconds. Pt reports good adherence to HEP. Pt did well with cone stepping activity, needing cues for larger forwards and backwards steps but not lateral. Pt limited this session by onset of absent-seizure episode, see above. Continue POC.   OBJECTIVE IMPAIRMENTS: Abnormal gait, decreased activity tolerance, decreased balance, decreased endurance, decreased knowledge of use of DME, decreased mobility, difficulty walking, decreased ROM, decreased strength, decreased safety awareness, dizziness, hypomobility, impaired flexibility, and impaired sensation.   ACTIVITY LIMITATIONS: lifting, bending, squatting, stairs, transfers, bathing, dressing,  and hygiene/grooming  PARTICIPATION LIMITATIONS: meal prep, cleaning, laundry, driving, shopping, community activity, occupation, and yard work  PERSONAL FACTORS: Age, Time since onset of injury/illness/exacerbation, and 3+ comorbidities:    hx left cerebellum stroke, TIA (July 2022), transient AMS possibly in setting of hypoglycemia, left-sided headaches, hypertension, hyperlipidemia, diabetes mellitus, h/o cervical fusion C5-7 (2012), h/o neck injury 2016, stage 3 CKD, and intracranial stenosis are also affecting patient's functional outcome.   REHAB POTENTIAL: Fair original stroke was in 2020, potentially had a new stroke, balance and gait have declined since last seen at this clinic  CLINICAL DECISION MAKING: Evolving/moderate complexity  EVALUATION COMPLEXITY: Moderate  PLAN:   PT FREQUENCY: 2x/week  PT DURATION: 6 weeks  PLANNED INTERVENTIONS: 97164- PT Re-evaluation, 97110-Therapeutic exercises, 97530- Therapeutic activity, 97112- Neuromuscular re-education, 97535- Self Care, 16109- Manual therapy, [id number]- Gait training, [phone]- Canalith repositioning, 97014- Electrical stimulation (unattended), [phone]- Electrical stimulation (manual), Patient/Family education, Balance training, Stair training, Taping, Dry Needling, Joint mobilization, Joint manipulation, Spinal manipulation, Spinal mobilization, Vestibular training, DME instructions, Cryotherapy, and Moist heat  PLAN FOR NEXT SESSION: MCTSIB conditions 3 and 4, new LTG as appropriate; pt requested to add on visits til end of cert period (thru 05-17-23) due to cancelled visits  - 4 visits added Cont balance and gait training -- RLE strengthening  Review exercises for HEP for RLE & core strengthening, balance, gait training, endurance Hx of seizures, no blaze pods, ask about blood glucose (low or >300)    Peter Congo, PT 05/01/2023, 3:42 PM

## 2023-05-07 ENCOUNTER — Encounter: Payer: Self-pay | Admitting: Physical Therapy

## 2023-05-07 ENCOUNTER — Ambulatory Visit: Payer: Medicare PPO | Attending: Adult Health | Admitting: Physical Therapy

## 2023-05-07 DIAGNOSIS — R293 Abnormal posture: Secondary | ICD-10-CM | POA: Insufficient documentation

## 2023-05-07 DIAGNOSIS — M6281 Muscle weakness (generalized): Secondary | ICD-10-CM | POA: Insufficient documentation

## 2023-05-07 DIAGNOSIS — R2689 Other abnormalities of gait and mobility: Secondary | ICD-10-CM | POA: Insufficient documentation

## 2023-05-07 DIAGNOSIS — R2681 Unsteadiness on feet: Secondary | ICD-10-CM | POA: Insufficient documentation

## 2023-05-07 NOTE — Therapy (Signed)
OUTPATIENT PHYSICAL THERAPY NEURO TREATMENT - ARRIVED/NO CHARGE   Patient Name: Jeff [name] MRN: [id number] DOB:04/03/1951, 72 y.o., male Today's Date: 05/07/2023   PCP: Anson Fret, MD  REFERRING PROVIDER: Ihor Austin, NP  END OF SESSION:  PT End of Session - 05/07/23 1435     Visit Number 9   No Charge due to elevated blood sugar level   Number of Visits 13   plus eval   Date for PT Re-Evaluation 05/17/23   to allow for scheduling delays   Authorization Type [employer] CHO[employer] PPO    Authorization Time Period 10-17 - 05-17-23    Authorization - Visit Number 9    Authorization - Number of Visits 13    PT Start Time 1405    PT Stop Time 1426    PT Time Calculation (min) 21 min    Activity Tolerance Treatment limited secondary to medical complications (Comment)   absent seizure towards end of session   Behavior During Therapy [facility] for tasks assessed/performed                     Past Medical History:  Diagnosis Date   ADHD (attention deficit hyperactivity disorder)    Diabetes mellitus    Hyperlipidemia    Hypertension    Renal disorder    Seizures (HCC)    Stroke [facility])    Past Surgical History:  Procedure Laterality Date   IR ANGIO INTRA EXTRACRAN SEL COM CAROTID INNOMINATE BILAT MOD SED  01/03/2021   IR ANGIO VERTEBRAL SEL SUBCLAVIAN INNOMINATE UNI L MOD SED  01/03/2021   IR ANGIO VERTEBRAL SEL VERTEBRAL UNI R MOD SED  01/03/2021   IR RADIOLOGIST EVAL & MGMT  01/13/2021   IR US GUIDE VASC ACCESS RIGHT  01/03/2021   NECK SURGERY     SPINE SURGERY     Cervical spine x 2; Vear Clock; Critzer.   Patient Active Problem List   Diagnosis Date Noted   Benign essential hypertension 11/20/2022   Hypomagnesemia 11/20/2022   Falls 11/20/2022   Obstructive sleep apnea (adult) (pediatric) 11/20/2022   Syncope and collapse 11/20/2022   Uncontrolled type 2 diabetes mellitus with hypoglycemia, with long-term current use of insulin (HCC) 11/20/2022   Acute  encephalopathy 11/20/2022   Hypoglycemia 11/20/2022   TIA (transient ischemic attack) 03/10/2020   CKD (chronic kidney disease) 08/28/2018   Retinopathy of both eyes 08/28/2018   Stroke due to embolism of left vertebral artery (HCC) 08/12/2018   Stroke (HCC) 08/08/2018   Diabetes mellitus (HCC) 07/25/2015   Neck injury 06/22/2015   Hearing loss in right ear 10/14/2011   Agent orange exposure 10/14/2011   ADD (attention deficit disorder) 10/14/2011   Hyperlipidemia 10/14/2011   BMI 28.0-28.9,adult 10/14/2011    ONSET DATE: 03/04/2023 (referral date)  REFERRING DIAG: G81.91 (ICD-10-CM) - Right hemiparesis (HCC)  THERAPY DIAG:  Other abnormalities of gait and mobility  Rationale for Evaluation and Treatment: Rehabilitation  SUBJECTIVE:  SUBJECTIVE STATEMENT:  Pt arrives to PT session accompanied by his wife - states his balance is not very good today due to elevated blood sugar level - reading is 361 on monitor.  Wife checked pt's blood sugar with finger prick to confirm/compare readings and reading was 361 - exact same as monitor reading.  Pt also reported having low energy feeling - stated he would like a referral to cardiology because he has had dyspnea and lack of energy.    Pt accompanied by: family member: wife, [name]  PERTINENT HISTORY: hx of left cerebellum stroke (March 2020) with residual imbalance/gait impairment and right-sided ataxia, TIA (July 2022), transient AMS possibly in setting of hypoglycemia, left-sided headaches, hypertension, hyperlipidemia, diabetes mellitus, h/o cervical fusion C5-7 (2012), h/o neck injury 2016, stage 3 CKD, and intracranial stenosis   PAIN:  Are you having pain? No   PRECAUTIONS: Fall  RED FLAGS: Bowel or bladder incontinence: No   WEIGHT BEARING  RESTRICTIONS: No  FALLS: Has patient fallen in last 6 months? Yes. Number of falls "Too many to remember": "in the last 2 weeks I fell once", has not gone to hospital for any of these falls, has hit head with one- "fell out of bed and hit head on nightstand" needed help from wife to get away from the bed but stood on own. Had fall in bathroom. Not typically falling when walking, but getting up from sitting. No reports of dizziness or lightheadedness.   LIVING ENVIRONMENT: Lives with: lives with their spouse Lives in: House/apartment Stairs: Yes: External: 1 to porch, 1 to foyer steps; none can use ramp at back of house Has following equipment at home:  "I've got it all"  PLOF: Needs assistance with ADLs- some dressing like little buttons on shirt sleeves or cufflinks, has not driven in over 2 years  PATIENT GOALS: "walk good", "get back to as normal as I can"  OBJECTIVE:  Note: Objective measures were completed at Evaluation unless otherwise noted.  DIAGNOSTIC FINDINGS:   MRI HEAD WITHOUT AND WITH CONTRAST (11/21/2022) IMPRESSION: 1. No acute intracranial abnormality or mass. No specific findings to explain seizures. 2. Unchanged moderate chronic small-vessel disease and central pattern of volume loss.  04/08/2023  5:20 PM repeat MRI WO Contrast   SENSATION: R hand "has gone to sleep", feels like this all the time, pt feels that it is colder than the L , impaired sensation on the top of both feet  EDEMA:  "Feet will swell every once in a while"  LOWER EXTREMITY MMT:    MMT Right Eval Left Eval  Hip flexion 3 4-  Hip extension    Hip abduction    Hip adduction    Hip internal rotation    Hip external rotation    Knee flexion 3 5  Knee extension 3 5  Ankle dorsiflexion 3+ 5  Ankle plantarflexion    Ankle inversion    Ankle eversion    (Blank rows = not tested)   STAIRS: Level of Assistance: CGA and Min A Stair Negotiation Technique: Step to Pattern with Bilateral  Rails Number of Stairs: 4  Height of Stairs: standard  Comments: pt w/ increased difficulty descending requiring Min A for balance "I'm not sure I could have done that without your help"  GAIT: Gait pattern: step through pattern, decreased arm swing- Right, decreased arm swing- Left, decreased step length- Right, decreased step length- Left, decreased hip/knee flexion- Right, decreased ankle dorsiflexion- Right, decreased ankle dorsiflexion- Left, trunk flexed,  poor foot clearance- Right, and poor foot clearance- Left Distance walked: various clinical distances Assistive device utilized: None Level of assistance: SBA Comments: consider assessing w/ ADs   TODAY'S TREATMENT:  05-07-23:  ARRIVED/ NO CHARGE                                                                                                                   Blood Sugar:  361 - monitor reading 2:05 pm                         361 - read with finger prick performed by wife (2:13 pm)  No treatment today due to pt not feeling well - feeling a lack of energy and due to high blood sugar level.  Pt had an absent seizure at end of previous PT session last Wed., 05-01-23.  Treatment withheld today due to high blood sugar reading.   Pt was informed that note from 05-01-23 (documented seizure activity) and today's note would be routed to endocrinologist, Dr. Dorisann Frames.  Pt and wife agree with this plan.  Pt to call and make appt with Dr. Talmage Nap for follow up for medical management of blood sugar levels.    PATIENT EDUCATION:  Education details: POC, continue HEP, outcome measures, LTG results Person educated: Patient and Spouse Education method: Explanation Education comprehension: verbalized understanding and needs further education  HOME EXERCISE PROGRAM:  Access Code: [email or web address] URL: [email or web address] Date: 03/29/2023 Prepared by: Beverely Low  Exercises - Standing Balance in Corner  - 1 x daily - 7 x weekly - 3  sets - 10 reps - Standing Balance in Corner with Eyes Closed  - 1 x daily - 7 x weekly - 3 sets - 10 reps  GOALS: Goals reviewed with patient? No  SHORT TERM GOALS: Target date: 04/11/2023   Pt will improve FGA to at least 10/30 (MCID 4, Fall risk <22) for improved dynamic balance and safety.   Baseline: 6/30 (10/17), 12/30 (04/11/23) Goal status: MET  2.  Pt will be independent with initial HEP for improved strength, balance, transfers and gait.  Baseline:  Goal status: MET  3.  Pt will improve gait velocity to at least 2.50 ft/sec for improved gait efficiency and safety in the home Baseline: 1.93 ft/sec (03/21/23), 2.10 ft/sec (04/11/23) Goal status: PROGRESSING  4.  Pt will hold romberg stance for 30 seconds with eyes open on solid ground with no LOB and SBA. Baseline: could not perform, met (04/11/23) Goal status: MET   LONG TERM GOALS: Target date: 05/02/2023   Pt will improve FGA to at least 14/30 (MCID 4, Fall risk <22) for improved dynamic balance and safety.   Baseline: 6/30 (03/21/23), 12/30 (04/11/23), 20/30 (04/29/23) Goal status: MET  2.  Pt will be independent with final HEP for improved strength, balance, transfers and gait.  Baseline: "they're good, I didn't do any in the shower today because we were running low on time" Goal status: MET  3.  Pt will improve gait velocity to at least 2.50 ft/sec (norm) for improved gait efficiency and safety in the community  Baseline: 1.93 ft/sec (03/21/23), 2.10 ft/sec (04/11/23), 3.95 ft/sec Goal status: MET  4.  Pt will hold romberg stance for 30 seconds with eyes closed on solid ground with no LOB and SBA. Baseline: could hold eyes open (04/11/23), met w/ close SBA (05/01/23) Goal status: MET  NEW LONG TERM GOALS: Target date: 05/17/2023 (last day of POC)  Pt will improve FGA to at least 24/30 (MCID 4, Fall risk <22) for improved dynamic balance and safety.   Baseline: 6/30 (03/21/23), 12/30 (04/11/23), 20/30  (04/29/23) Goal status: INITIAL  2.  Trial MCTSIB conditions 3 and 4 and set goal as appropriate  Baseline:  Goal status: INITIAL   ASSESSMENT:  CLINICAL IMPRESSION:  PT treatment withheld today due to elevated blood sugar reading of 361; pt also reported that he did not feel very well - feeling low energy.  Treatment note from 05-01-23 and today to be routed to Dr. Talmage Nap per pt and wife's request.  OBJECTIVE IMPAIRMENTS: Abnormal gait, decreased activity tolerance, decreased balance, decreased endurance, decreased knowledge of use of DME, decreased mobility, difficulty walking, decreased ROM, decreased strength, decreased safety awareness, dizziness, hypomobility, impaired flexibility, and impaired sensation.   ACTIVITY LIMITATIONS: lifting, bending, squatting, stairs, transfers, bathing, dressing, and hygiene/grooming  PARTICIPATION LIMITATIONS: meal prep, cleaning, laundry, driving, shopping, community activity, occupation, and yard work  PERSONAL FACTORS: Age, Time since onset of injury/illness/exacerbation, and 3+ comorbidities:    hx left cerebellum stroke, TIA (July 2022), transient AMS possibly in setting of hypoglycemia, left-sided headaches, hypertension, hyperlipidemia, diabetes mellitus, h/o cervical fusion C5-7 (2012), h/o neck injury 2016, stage 3 CKD, and intracranial stenosis are also affecting patient's functional outcome.   REHAB POTENTIAL: Fair original stroke was in 2020, potentially had a new stroke, balance and gait have declined since last seen at this clinic  CLINICAL DECISION MAKING: Evolving/moderate complexity  EVALUATION COMPLEXITY: Moderate  PLAN:   PT FREQUENCY: 2x/week  PT DURATION: 6 weeks  PLANNED INTERVENTIONS: 97164- PT Re-evaluation, 97110-Therapeutic exercises, 97530- Therapeutic activity, 97112- Neuromuscular re-education, 97535- Self Care, 40981- Manual therapy, [phone]- Gait training, [phone]- Canalith repositioning, 97014- Electrical stimulation  (unattended), [phone]- Electrical stimulation (manual), Patient/Family education, Balance training, Stair training, Taping, Dry Needling, Joint mobilization, Joint manipulation, Spinal manipulation, Spinal mobilization, Vestibular training, DME instructions, Cryotherapy, and Moist heat  PLAN FOR NEXT SESSION: 10th visit progress note due next session  MCTSIB conditions 3 and 4, new LTG as appropriate; pt requested to add on visits til end of cert period (thru 05-17-23) due to cancelled visits  - 4 visits added Cont balance and gait training -- RLE strengthening  Review exercises for HEP for RLE & core strengthening, balance, gait training, endurance Hx of seizures, no blaze pods, ask about blood glucose (low or >300)    Natalyn Szymanowski, Donavan Burnet, PT 05/07/2023, 2:38 PM

## 2023-05-09 ENCOUNTER — Ambulatory Visit: Payer: Medicare PPO | Admitting: Physical Therapy

## 2023-05-09 NOTE — Therapy (Incomplete)
OUTPATIENT PHYSICAL THERAPY NEURO TREATMENT - ARRIVED/NO CHARGE   Patient Name: MILEN TREECE MRN: [id number] DOB:10-21-1950, 72 y.o., male Today's Date: 05/09/2023   PCP: Anson Fret, MD  REFERRING PROVIDER: Ihor Austin, NP  END OF SESSION:            Past Medical History:  Diagnosis Date   ADHD (attention deficit hyperactivity disorder)    Diabetes mellitus    Hyperlipidemia    Hypertension    Renal disorder    Seizures (HCC)    Stroke [facility])    Past Surgical History:  Procedure Laterality Date   IR ANGIO INTRA EXTRACRAN SEL COM CAROTID INNOMINATE BILAT MOD SED  01/03/2021   IR ANGIO VERTEBRAL SEL SUBCLAVIAN INNOMINATE UNI L MOD SED  01/03/2021   IR ANGIO VERTEBRAL SEL VERTEBRAL UNI R MOD SED  01/03/2021   IR RADIOLOGIST EVAL & MGMT  01/13/2021   IR US GUIDE VASC ACCESS RIGHT  01/03/2021   NECK SURGERY     SPINE SURGERY     Cervical spine x 2; Vear Clock; Critzer.   Patient Active Problem List   Diagnosis Date Noted   Benign essential hypertension 11/20/2022   Hypomagnesemia 11/20/2022   Falls 11/20/2022   Obstructive sleep apnea (adult) (pediatric) 11/20/2022   Syncope and collapse 11/20/2022   Uncontrolled type 2 diabetes mellitus with hypoglycemia, with long-term current use of insulin (HCC) 11/20/2022   Acute encephalopathy 11/20/2022   Hypoglycemia 11/20/2022   TIA (transient ischemic attack) 03/10/2020   CKD (chronic kidney disease) 08/28/2018   Retinopathy of both eyes 08/28/2018   Stroke due to embolism of left vertebral artery (HCC) 08/12/2018   Stroke (HCC) 08/08/2018   Diabetes mellitus (HCC) 07/25/2015   Neck injury 06/22/2015   Hearing loss in right ear 10/14/2011   Agent orange exposure 10/14/2011   ADD (attention deficit disorder) 10/14/2011   Hyperlipidemia 10/14/2011   BMI 28.0-28.9,adult 10/14/2011    ONSET DATE: 03/04/2023 (referral date)  REFERRING DIAG: G81.91 (ICD-10-CM) - Right hemiparesis (HCC)  THERAPY DIAG:  No diagnosis  found.  Rationale for Evaluation and Treatment: Rehabilitation  SUBJECTIVE:                                                                                                                                                                                             SUBJECTIVE STATEMENT: *** Pt arrives to PT session accompanied by his wife - states his balance is not very good today due to elevated blood sugar level - reading is 361 on monitor.  Wife checked pt's blood sugar with finger prick to confirm/compare readings and reading was 361 - exact  same as monitor reading.  Pt also reported having low energy feeling - stated he would like a referral to cardiology because he has had dyspnea and lack of energy.    Pt accompanied by: family member: wife, [name]  PERTINENT HISTORY: hx of left cerebellum stroke (March 2020) with residual imbalance/gait impairment and right-sided ataxia, TIA (July 2022), transient AMS possibly in setting of hypoglycemia, left-sided headaches, hypertension, hyperlipidemia, diabetes mellitus, h/o cervical fusion C5-7 (2012), h/o neck injury 2016, stage 3 CKD, and intracranial stenosis   PAIN: *** Are you having pain? No   PRECAUTIONS: Fall  RED FLAGS: Bowel or bladder incontinence: No   WEIGHT BEARING RESTRICTIONS: No  FALLS: Has patient fallen in last 6 months? Yes. Number of falls "Too many to remember": "in the last 2 weeks I fell once", has not gone to hospital for any of these falls, has hit head with one- "fell out of bed and hit head on nightstand" needed help from wife to get away from the bed but stood on own. Had fall in bathroom. Not typically falling when walking, but getting up from sitting. No reports of dizziness or lightheadedness.   LIVING ENVIRONMENT: Lives with: lives with their spouse Lives in: House/apartment Stairs: Yes: External: 1 to porch, 1 to foyer steps; none can use ramp at back of house Has following equipment at home:  "I've got it  all"  PLOF: Needs assistance with ADLs- some dressing like little buttons on shirt sleeves or cufflinks, has not driven in over 2 years  PATIENT GOALS: "walk good", "get back to as normal as I can"  OBJECTIVE:  Note: Objective measures were completed at Evaluation unless otherwise noted.  DIAGNOSTIC FINDINGS:   MRI HEAD WITHOUT AND WITH CONTRAST (11/21/2022) IMPRESSION: 1. No acute intracranial abnormality or mass. No specific findings to explain seizures. 2. Unchanged moderate chronic small-vessel disease and central pattern of volume loss.  04/08/2023  5:20 PM repeat MRI WO Contrast   SENSATION: R hand "has gone to sleep", feels like this all the time, pt feels that it is colder than the L , impaired sensation on the top of both feet  EDEMA:  "Feet will swell every once in a while"  LOWER EXTREMITY MMT:    MMT Right Eval Left Eval  Hip flexion 3 4-  Hip extension    Hip abduction    Hip adduction    Hip internal rotation    Hip external rotation    Knee flexion 3 5  Knee extension 3 5  Ankle dorsiflexion 3+ 5  Ankle plantarflexion    Ankle inversion    Ankle eversion    (Blank rows = not tested)   STAIRS: Level of Assistance: CGA and Min A Stair Negotiation Technique: Step to Pattern with Bilateral Rails Number of Stairs: 4  Height of Stairs: standard  Comments: pt w/ increased difficulty descending requiring Min A for balance "I'm not sure I could have done that without your help"  GAIT: Gait pattern: step through pattern, decreased arm swing- Right, decreased arm swing- Left, decreased step length- Right, decreased step length- Left, decreased hip/knee flexion- Right, decreased ankle dorsiflexion- Right, decreased ankle dorsiflexion- Left, trunk flexed, poor foot clearance- Right, and poor foot clearance- Left Distance walked: various clinical distances Assistive device utilized: None Level of assistance: SBA Comments: consider assessing w/  ADs   TODAY'S TREATMENT:  05-07-23:  ARRIVED/ NO CHARGE                                                                                                                  ***  Blood Sugar:  361 - monitor reading 2:05 pm                         361 - read with finger prick performed by wife (2:13 pm)  No treatment today due to pt not feeling well - feeling a lack of energy and due to high blood sugar level.  Pt had an absent seizure at end of previous PT session last Wed., 05-01-23.  Treatment withheld today due to high blood sugar reading.   Pt was informed that note from 05-01-23 (documented seizure activity) and today's note would be routed to endocrinologist, Dr. Dorisann Frames.  Pt and wife agree with this plan.  Pt to call and make appt with Dr. Talmage Nap for follow up for medical management of blood sugar levels.    PATIENT EDUCATION: *** Education details: POC, continue HEP, outcome measures, LTG results Person educated: Patient and Spouse Education method: Explanation Education comprehension: verbalized understanding and needs further education  HOME EXERCISE PROGRAM: *** Access Code: [email or web address] URL: [email or web address] Date: 03/29/2023 Prepared by: Beverely Low  Exercises - Standing Balance in Corner  - 1 x daily - 7 x weekly - 3 sets - 10 reps - Standing Balance in Corner with Eyes Closed  - 1 x daily - 7 x weekly - 3 sets - 10 reps  GOALS: Goals reviewed with patient? No  SHORT TERM GOALS: Target date: 04/11/2023   Pt will improve FGA to at least 10/30 (MCID 4, Fall risk <22) for improved dynamic balance and safety.   Baseline: 6/30 (10/17), 12/30 (04/11/23) Goal status: MET  2.  Pt will be independent with initial HEP for improved strength, balance, transfers and gait.  Baseline:  Goal status: MET  3.  Pt will improve gait velocity to at least 2.50 ft/sec for improved gait efficiency and safety in the home Baseline: 1.93 ft/sec (03/21/23), 2.10 ft/sec  (04/11/23) Goal status: PROGRESSING  4.  Pt will hold romberg stance for 30 seconds with eyes open on solid ground with no LOB and SBA. Baseline: could not perform, met (04/11/23) Goal status: MET   LONG TERM GOALS: Target date: 05/02/2023   Pt will improve FGA to at least 14/30 (MCID 4, Fall risk <22) for improved dynamic balance and safety.   Baseline: 6/30 (03/21/23), 12/30 (04/11/23), 20/30 (04/29/23) Goal status: MET  2.  Pt will be independent with final HEP for improved strength, balance, transfers and gait.  Baseline: "they're good, I didn't do any in the shower today because we were running low on time" Goal status: MET  3.  Pt will improve gait velocity to at least 2.50 ft/sec (norm) for improved gait efficiency and safety in the community  Baseline: 1.93 ft/sec (03/21/23), 2.10 ft/sec (04/11/23), 3.95 ft/sec Goal status: MET  4.  Pt will hold romberg stance for 30 seconds with eyes closed on solid ground with no LOB and SBA. Baseline: could hold eyes open (04/11/23), met w/ close SBA (05/01/23) Goal status: MET  NEW LONG TERM GOALS: Target date: 05/17/2023 (last day of POC)  Pt will improve FGA to at least 24/30 (MCID 4, Fall risk <22) for improved dynamic balance and safety.   Baseline: 6/30 (03/21/23), 12/30 (04/11/23), 20/30 (04/29/23) Goal status: INITIAL  2.  Trial MCTSIB conditions 3 and 4 and set goal as appropriate  Baseline:  Goal status: INITIAL   ASSESSMENT:  CLINICAL IMPRESSION:  PT treatment withheld today due to elevated blood sugar reading  of 361; pt also reported that he did not feel very well - feeling low energy.  Treatment note from 05-01-23 and today to be routed to Dr. Talmage Nap per pt and wife's request. ***  OBJECTIVE IMPAIRMENTS: Abnormal gait, decreased activity tolerance, decreased balance, decreased endurance, decreased knowledge of use of DME, decreased mobility, difficulty walking, decreased ROM, decreased strength, decreased safety awareness,  dizziness, hypomobility, impaired flexibility, and impaired sensation.   ACTIVITY LIMITATIONS: lifting, bending, squatting, stairs, transfers, bathing, dressing, and hygiene/grooming  PARTICIPATION LIMITATIONS: meal prep, cleaning, laundry, driving, shopping, community activity, occupation, and yard work  PERSONAL FACTORS: Age, Time since onset of injury/illness/exacerbation, and 3+ comorbidities:    hx left cerebellum stroke, TIA (July 2022), transient AMS possibly in setting of hypoglycemia, left-sided headaches, hypertension, hyperlipidemia, diabetes mellitus, h/o cervical fusion C5-7 (2012), h/o neck injury 2016, stage 3 CKD, and intracranial stenosis are also affecting patient's functional outcome.   REHAB POTENTIAL: Fair original stroke was in 2020, potentially had a new stroke, balance and gait have declined since last seen at this clinic  CLINICAL DECISION MAKING: Evolving/moderate complexity  EVALUATION COMPLEXITY: Moderate  PLAN:   PT FREQUENCY: 2x/week  PT DURATION: 6 weeks  PLANNED INTERVENTIONS: 97164- PT Re-evaluation, 97110-Therapeutic exercises, 97530- Therapeutic activity, 97112- Neuromuscular re-education, 97535- Self Care, 16109- Manual therapy, [phone]- Gait training, [phone]- Canalith repositioning, 97014- Electrical stimulation (unattended), [phone]- Electrical stimulation (manual), Patient/Family education, Balance training, Stair training, Taping, Dry Needling, Joint mobilization, Joint manipulation, Spinal manipulation, Spinal mobilization, Vestibular training, DME instructions, Cryotherapy, and Moist heat  PLAN FOR NEXT SESSION: 10th visit progress note due next session  MCTSIB conditions 3 and 4, new LTG as appropriate; pt requested to add on visits til end of cert period (thru 05-17-23) due to cancelled visits  - 4 visits added Cont balance and gait training -- RLE strengthening  Review exercises for HEP for RLE & core strengthening, balance, gait training,  endurance Hx of seizures, no blaze pods, ask about blood glucose (low or >300)    Beverely Low, Student-PT 05/09/2023, 8:49 AM

## 2023-05-14 ENCOUNTER — Ambulatory Visit: Payer: Medicare PPO | Admitting: Physical Therapy

## 2023-05-14 DIAGNOSIS — R2689 Other abnormalities of gait and mobility: Secondary | ICD-10-CM

## 2023-05-14 DIAGNOSIS — R293 Abnormal posture: Secondary | ICD-10-CM | POA: Diagnosis not present

## 2023-05-14 DIAGNOSIS — M6281 Muscle weakness (generalized): Secondary | ICD-10-CM | POA: Diagnosis not present

## 2023-05-14 DIAGNOSIS — R2681 Unsteadiness on feet: Secondary | ICD-10-CM | POA: Diagnosis not present

## 2023-05-14 NOTE — Therapy (Signed)
OUTPATIENT PHYSICAL THERAPY NEURO TREATMENT - 10th VISIT PROGRESS NOTE   Patient Name: AJAI BURKLAND MRN: [id number] DOB:04/01/1951, 72 y.o., male Today's Date: 05/14/2023   PCP: Anson Fret, MD  REFERRING PROVIDER: Ihor Austin, NP  Physical Therapy Progress Note   Dates of Reporting Period: 03/21/2023 - 05/14/2023  See Note below for Objective Data and Assessment of Progress/Goals.  Thank you for the referral of this patient. Peter Congo, PT, DPT, CSRS   END OF SESSION:  PT End of Session - 05/14/23 1151     Visit Number 10    Number of Visits 13   plus eval   Date for PT Re-Evaluation 05/17/23   to allow for scheduling delays   Authorization Type [employer] CHO[employer] PPO    Authorization Time Period 10-17 - 05-17-23    Authorization - Number of Visits 13    PT Start Time 1150    PT Stop Time 1239    PT Time Calculation (min) 49 min    Activity Tolerance Treatment limited secondary to medical complications (Comment)   absent seizure towards end of session   Behavior During Therapy [facility] for tasks assessed/performed                      Past Medical History:  Diagnosis Date   ADHD (attention deficit hyperactivity disorder)    Diabetes mellitus    Hyperlipidemia    Hypertension    Renal disorder    Seizures (HCC)    Stroke [facility])    Past Surgical History:  Procedure Laterality Date   IR ANGIO INTRA EXTRACRAN SEL COM CAROTID INNOMINATE BILAT MOD SED  01/03/2021   IR ANGIO VERTEBRAL SEL SUBCLAVIAN INNOMINATE UNI L MOD SED  01/03/2021   IR ANGIO VERTEBRAL SEL VERTEBRAL UNI R MOD SED  01/03/2021   IR RADIOLOGIST EVAL & MGMT  01/13/2021   IR US GUIDE VASC ACCESS RIGHT  01/03/2021   NECK SURGERY     SPINE SURGERY     Cervical spine x 2; Vear Clock; Critzer.   Patient Active Problem List   Diagnosis Date Noted   Benign essential hypertension 11/20/2022   Hypomagnesemia 11/20/2022   Falls 11/20/2022   Obstructive sleep apnea (adult) (pediatric)  11/20/2022   Syncope and collapse 11/20/2022   Uncontrolled type 2 diabetes mellitus with hypoglycemia, with long-term current use of insulin (HCC) 11/20/2022   Acute encephalopathy 11/20/2022   Hypoglycemia 11/20/2022   TIA (transient ischemic attack) 03/10/2020   CKD (chronic kidney disease) 08/28/2018   Retinopathy of both eyes 08/28/2018   Stroke due to embolism of left vertebral artery (HCC) 08/12/2018   Stroke (HCC) 08/08/2018   Diabetes mellitus (HCC) 07/25/2015   Neck injury 06/22/2015   Hearing loss in right ear 10/14/2011   Agent orange exposure 10/14/2011   ADD (attention deficit disorder) 10/14/2011   Hyperlipidemia 10/14/2011   BMI 28.0-28.9,adult 10/14/2011    ONSET DATE: 03/04/2023 (referral date)  REFERRING DIAG: G81.91 (ICD-10-CM) - Right hemiparesis (HCC)  THERAPY DIAG:  Other abnormalities of gait and mobility  Muscle weakness (generalized)  Unsteadiness on feet  Abnormal posture  Rationale for Evaluation and Treatment: Rehabilitation  SUBJECTIVE:  SUBJECTIVE STATEMENT:  Pt's wife has been monitoring his BG and carb counting, treating BG on sliding scale. Per his personal monitor patient's BG is 214 today which is higher than it has been over the past few days. No other acute changes (no falls). Pt has been working on his HEP.    Pt still has the most difficulty with eyes closed, head turns, etc.  Pt accompanied by: family member: wife, [name]  PERTINENT HISTORY: hx of left cerebellum stroke (March 2020) with residual imbalance/gait impairment and right-sided ataxia, TIA (July 2022), transient AMS possibly in setting of hypoglycemia, left-sided headaches, hypertension, hyperlipidemia, diabetes mellitus, h/o cervical fusion C5-7 (2012), h/o neck injury 2016, stage 3 CKD,  and intracranial stenosis   PAIN:  Are you having pain? No   PRECAUTIONS: Fall  RED FLAGS: Bowel or bladder incontinence: No   WEIGHT BEARING RESTRICTIONS: No  FALLS: Has patient fallen in last 6 months? Yes. Number of falls "Too many to remember": "in the last 2 weeks I fell once", has not gone to hospital for any of these falls, has hit head with one- "fell out of bed and hit head on nightstand" needed help from wife to get away from the bed but stood on own. Had fall in bathroom. Not typically falling when walking, but getting up from sitting. No reports of dizziness or lightheadedness.   LIVING ENVIRONMENT: Lives with: lives with their spouse Lives in: House/apartment Stairs: Yes: External: 1 to porch, 1 to foyer steps; none can use ramp at back of house Has following equipment at home:  "I've got it all"  PLOF: Needs assistance with ADLs- some dressing like little buttons on shirt sleeves or cufflinks, has not driven in over 2 years  PATIENT GOALS: "walk good", "get back to as normal as I can"  OBJECTIVE:  Note: Objective measures were completed at Evaluation unless otherwise noted.  DIAGNOSTIC FINDINGS:   MRI HEAD WITHOUT AND WITH CONTRAST (11/21/2022) IMPRESSION: 1. No acute intracranial abnormality or mass. No specific findings to explain seizures. 2. Unchanged moderate chronic small-vessel disease and central pattern of volume loss.  04/08/2023  5:20 PM repeat MRI WO Contrast   SENSATION: R hand "has gone to sleep", feels like this all the time, pt feels that it is colder than the L , impaired sensation on the top of both feet  EDEMA:  "Feet will swell every once in a while"  LOWER EXTREMITY MMT:    MMT Right Eval Left Eval  Hip flexion 3 4-  Hip extension    Hip abduction    Hip adduction    Hip internal rotation    Hip external rotation    Knee flexion 3 5  Knee extension 3 5  Ankle dorsiflexion 3+ 5  Ankle plantarflexion    Ankle inversion     Ankle eversion    (Blank rows = not tested)   STAIRS: Level of Assistance: CGA and Min A Stair Negotiation Technique: Step to Pattern with Bilateral Rails Number of Stairs: 4  Height of Stairs: standard  Comments: pt w/ increased difficulty descending requiring Min A for balance "I'm not sure I could have done that without your help"  GAIT: Gait pattern: step through pattern, decreased arm swing- Right, decreased arm swing- Left, decreased step length- Right, decreased step length- Left, decreased hip/knee flexion- Right, decreased ankle dorsiflexion- Right, decreased ankle dorsiflexion- Left, trunk flexed, poor foot clearance- Right, and poor foot clearance- Left Distance walked: various clinical distances Assistive device  utilized: None Level of assistance: SBA Comments: consider assessing w/ ADs   TODAY'S TREATMENT:                                                                                                                 TherAct Corner balance to review/upgrade HEP Normal stance EO x 30 sec Normal stance EC x 30 sec, mild sway but no LOB Romberg stance EO x 30 sec Romberg stance EC x 30 sec, mild sway but no LOB +airex with increased sway and several LOB, able to hold x 15-20 sec x 2 reps, x 30 sec one rep Wide tandem stance (L/R) with EC x 30 sec each, mild sway with RLE back +airex with LOB posteriorly with both L and R  Revised/added to current HEP, see bolded below  Ambulation through obstacle course stepping over various height obstacles (2-6") with CGA to min A needed for balance. Pt exhibits good LE clearance overall, does occasionally circumduct his RLE but able to correct with cues.  Following obstacle course navigation pt reports that he feels lightheaded, below vitals obtained: BP: 127/87, HR 85 BG: 181 per monitor, 211 per finger prick  Pt has onset of absent seizure following vitals assessment. Pt absent for several minutes, emotionally labile once he  comes out of his seizure and is tearful/crying. This therapist and patient's wife provide emotional support as well as physical support as pt falls to his L side and posteriorly during his seizure. Triggers for seizures remain unclear as pt's BG WNL this session. Pt able to safely ambulate back to his car at end of appointment with his wife.   PATIENT EDUCATION:  Education details: continue HEP, added to HEP, will discuss PT POC with other primary PT Person educated: Patient and Spouse Education method: Explanation Education comprehension: verbalized understanding and needs further education  HOME EXERCISE PROGRAM:  Access Code: [email or web address] URL: [email or web address] Date: 05/14/2023 Prepared by: Peter Congo  Exercises - Standing Balance in Corner with Eyes Closed  - 1 x daily - 7 x weekly - 1 sets - 5 reps - 30 seconds hold - Romberg Stance with Eyes Closed  - 1 x daily - 7 x weekly - 1 sets - 5 reps - 30 seconds hold - Wide Tandem Stance with Eyes Closed  - 1 x daily - 7 x weekly - 1 sets - 5 reps - 30 seconds hold   GOALS: Goals reviewed with patient? No  SHORT TERM GOALS: Target date: 04/11/2023   Pt will improve FGA to at least 10/30 (MCID 4, Fall risk <22) for improved dynamic balance and safety.   Baseline: 6/30 (10/17), 12/30 (04/11/23) Goal status: MET  2.  Pt will be independent with initial HEP for improved strength, balance, transfers and gait.  Baseline:  Goal status: MET  3.  Pt will improve gait velocity to at least 2.50 ft/sec for improved gait efficiency and safety in the home Baseline: 1.93 ft/sec (03/21/23), 2.10 ft/sec (04/11/23) Goal status: PROGRESSING  4.  Pt will  hold romberg stance for 30 seconds with eyes open on solid ground with no LOB and SBA. Baseline: could not perform, met (04/11/23) Goal status: MET   LONG TERM GOALS: Target date: 05/02/2023   Pt will improve FGA to at least 14/30 (MCID 4, Fall risk <22) for improved dynamic  balance and safety.   Baseline: 6/30 (03/21/23), 12/30 (04/11/23), 20/30 (04/29/23) Goal status: MET  2.  Pt will be independent with final HEP for improved strength, balance, transfers and gait.  Baseline: "they're good, I didn't do any in the shower today because we were running low on time" Goal status: MET  3.  Pt will improve gait velocity to at least 2.50 ft/sec (norm) for improved gait efficiency and safety in the community  Baseline: 1.93 ft/sec (03/21/23), 2.10 ft/sec (04/11/23), 3.95 ft/sec Goal status: MET  4.  Pt will hold romberg stance for 30 seconds with eyes closed on solid ground with no LOB and SBA. Baseline: could hold eyes open (04/11/23), met w/ close SBA (05/01/23) Goal status: MET  NEW LONG TERM GOALS: Target date: 05/17/2023 (last day of POC)  Pt will improve FGA to at least 24/30 (MCID 4, Fall risk <22) for improved dynamic balance and safety.   Baseline: 6/30 (03/21/23), 12/30 (04/11/23), 20/30 (04/29/23) Goal status: INITIAL  2.  Trial MCTSIB conditions 3 and 4 and set goal as appropriate  Baseline:  Goal status: INITIAL   ASSESSMENT:  CLINICAL IMPRESSION:  Emphasis of skilled PT session on reviewing/upgrading patient's HEP, continuing to work on dynamic standing balance, and managing patient's absent seizure during session. Pt exhibits improved static standing balance with corner balance tasks this date so upgraded his HEP. Pt does well with obstacle course task but then has onset of lightheadedness and an absent seizure. Pt's seizures have typically been triggered by low BG (lower than 70) or higher BG (300+) but his readings were within a normal range this session. Pt may benefit from a pause on physical therapy until his seizures are better managed, will discuss with patient's other primary PT. Continue POC.   OBJECTIVE IMPAIRMENTS: Abnormal gait, decreased activity tolerance, decreased balance, decreased endurance, decreased knowledge of use of DME,  decreased mobility, difficulty walking, decreased ROM, decreased strength, decreased safety awareness, dizziness, hypomobility, impaired flexibility, and impaired sensation.   ACTIVITY LIMITATIONS: lifting, bending, squatting, stairs, transfers, bathing, dressing, and hygiene/grooming  PARTICIPATION LIMITATIONS: meal prep, cleaning, laundry, driving, shopping, community activity, occupation, and yard work  PERSONAL FACTORS: Age, Time since onset of injury/illness/exacerbation, and 3+ comorbidities:    hx left cerebellum stroke, TIA (July 2022), transient AMS possibly in setting of hypoglycemia, left-sided headaches, hypertension, hyperlipidemia, diabetes mellitus, h/o cervical fusion C5-7 (2012), h/o neck injury 2016, stage 3 CKD, and intracranial stenosis are also affecting patient's functional outcome.   REHAB POTENTIAL: Fair original stroke was in 2020, potentially had a new stroke, balance and gait have declined since last seen at this clinic  CLINICAL DECISION MAKING: Evolving/moderate complexity  EVALUATION COMPLEXITY: Moderate  PLAN:   PT FREQUENCY: 2x/week  PT DURATION: 6 weeks  PLANNED INTERVENTIONS: 97164- PT Re-evaluation, 97110-Therapeutic exercises, 97530- Therapeutic activity, 97112- Neuromuscular re-education, 97535- Self Care, 56213- Manual therapy, [id number]- Gait training, [id number]- Canalith repositioning, 97014- Electrical stimulation (unattended), [phone]- Electrical stimulation (manual), Patient/Family education, Balance training, Stair training, Taping, Dry Needling, Joint mobilization, Joint manipulation, Spinal manipulation, Spinal mobilization, Vestibular training, DME instructions, Cryotherapy, and Moist heat  PLAN FOR NEXT SESSION: assess LTG and d/c vs add visits?  MCTSIB conditions  3 and 4, new LTG as appropriate; Cont balance and gait training -- RLE strengthening  Review exercises for HEP for RLE & core strengthening, balance, gait training, endurance  Hx of  seizures, no blaze pods, ask about blood glucose (low or >300)    Peter Congo, PT Peter Congo, PT, DPT, CSRS  05/14/2023, 12:39 PM

## 2023-05-16 ENCOUNTER — Ambulatory Visit: Payer: Medicare PPO | Admitting: Physical Therapy

## 2023-05-16 DIAGNOSIS — M6281 Muscle weakness (generalized): Secondary | ICD-10-CM

## 2023-05-16 DIAGNOSIS — R293 Abnormal posture: Secondary | ICD-10-CM | POA: Diagnosis not present

## 2023-05-16 DIAGNOSIS — R2681 Unsteadiness on feet: Secondary | ICD-10-CM | POA: Diagnosis not present

## 2023-05-16 DIAGNOSIS — R2689 Other abnormalities of gait and mobility: Secondary | ICD-10-CM

## 2023-05-16 IMAGING — CT CT ABD-PELV W/ CM
2 of 5 series · 16 of 46 positions shown, 18 images · IV contrast (APPLIED)
Comparison: 09/04/2017

CLINICAL DATA: Left abdominal wall swelling and redness after
injecting insulin 6 days ago.

EXAM:
CT ABDOMEN AND PELVIS WITH CONTRAST
TECHNIQUE: Multidetector CT imaging of the abdomen and pelvis was performed
using the standard protocol following bolus administration of
intravenous contrast.
CONTRAST:  75mL OMNIPAQUE IOHEXOL 300 MG/ML  SOLN

[Series 2: abd pel w · axial · 0.86mm/px · z∈[+877,+1292]mm · 13 of 93 slices shown, 15 images]
[im 5/93  soft-tissue]
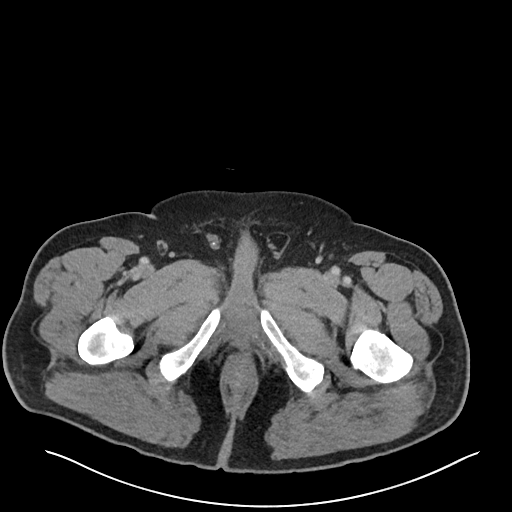
[im 5/93  bone]
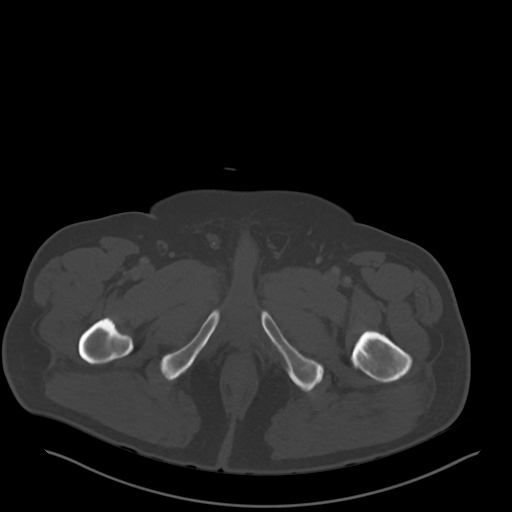
[im 15/93  soft-tissue]
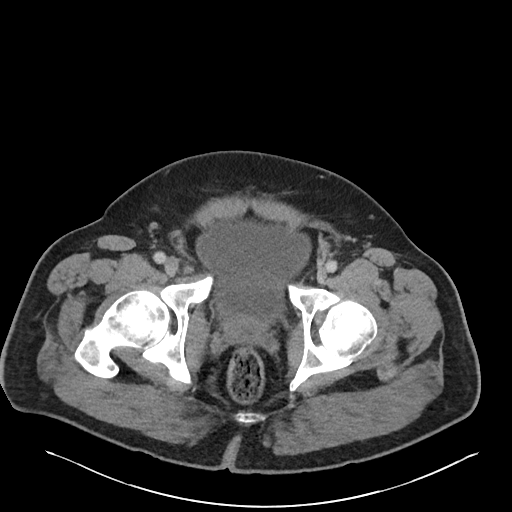
[im 20/93  soft-tissue]
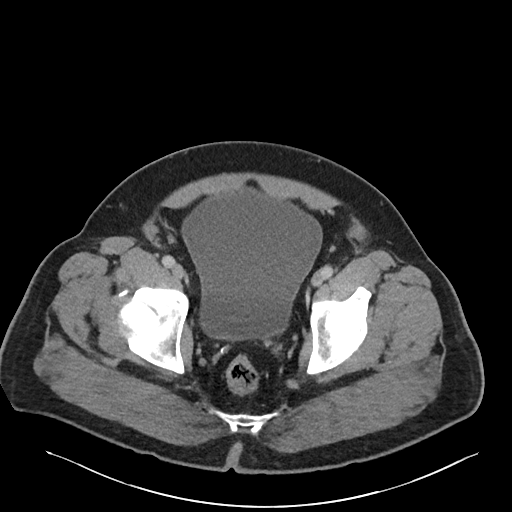
[im 25/93  soft-tissue]
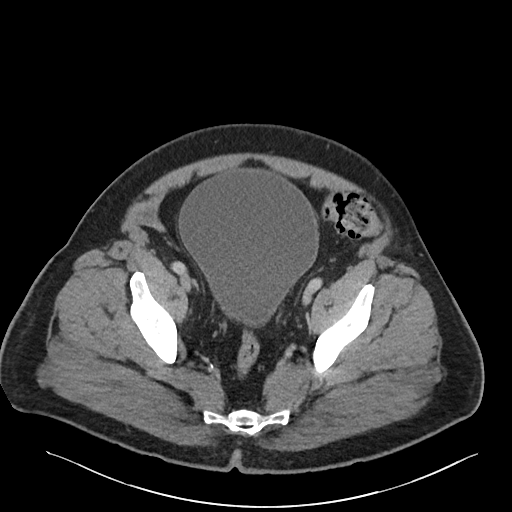
[im 34/93  soft-tissue]
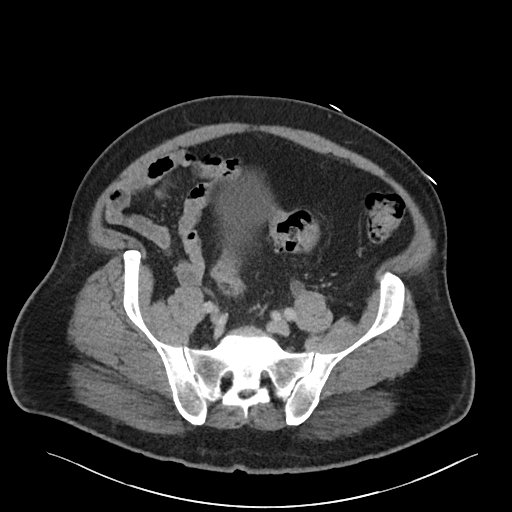
[im 39/93  soft-tissue]
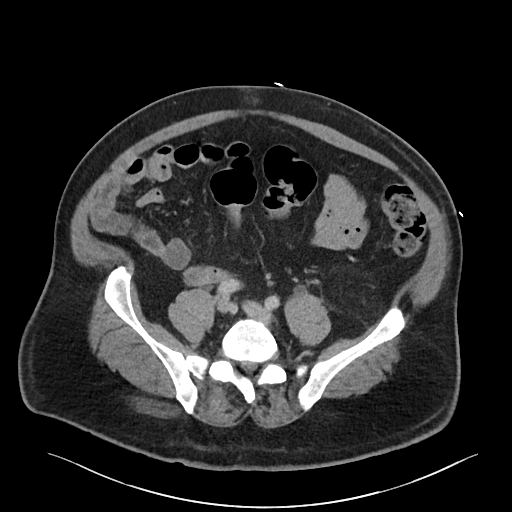
[im 49/93  soft-tissue]
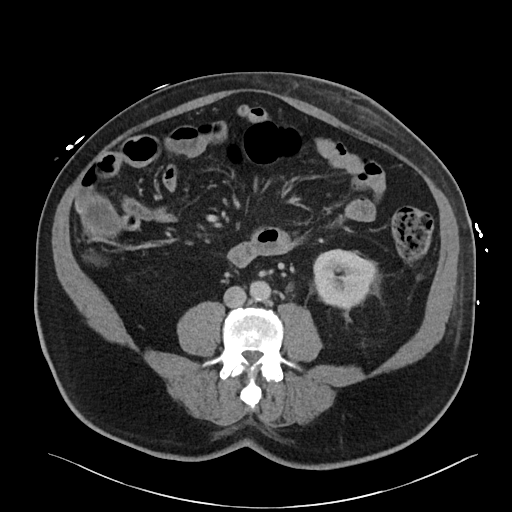
[im 54/93  soft-tissue]
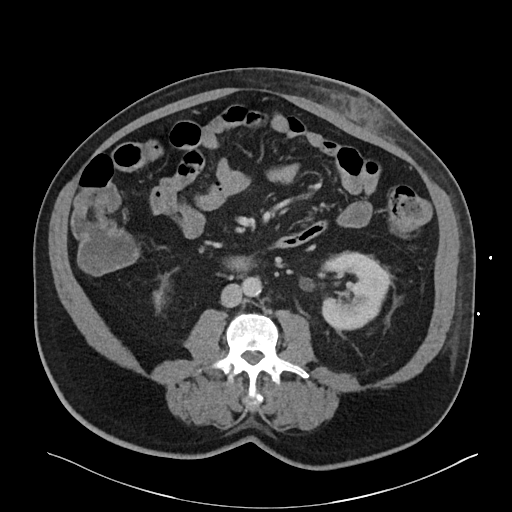
[im 59/93  soft-tissue]
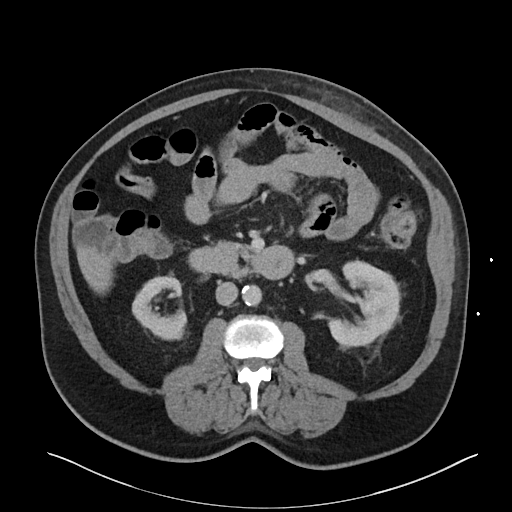
[im 59/93  bone]
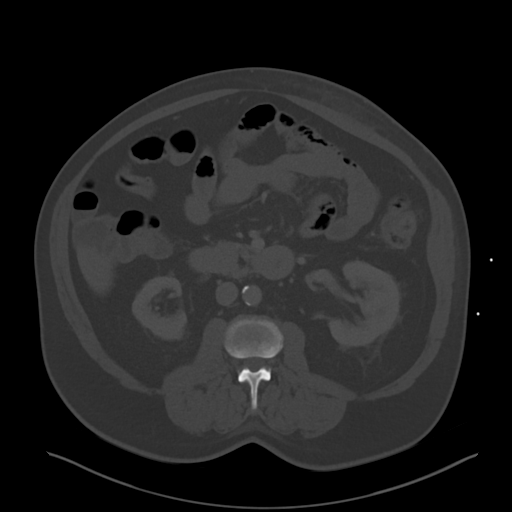
[im 68/93  soft-tissue]
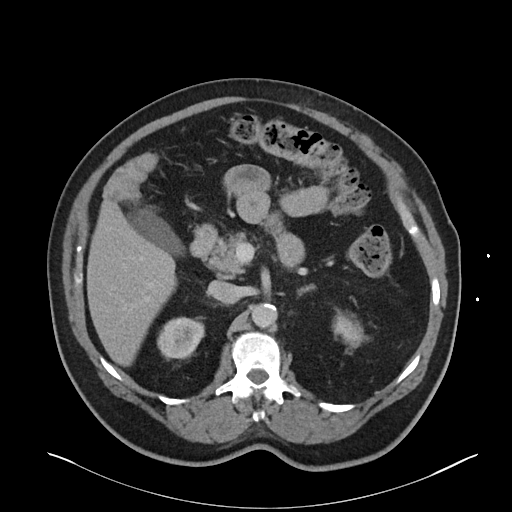
[im 73/93  soft-tissue]
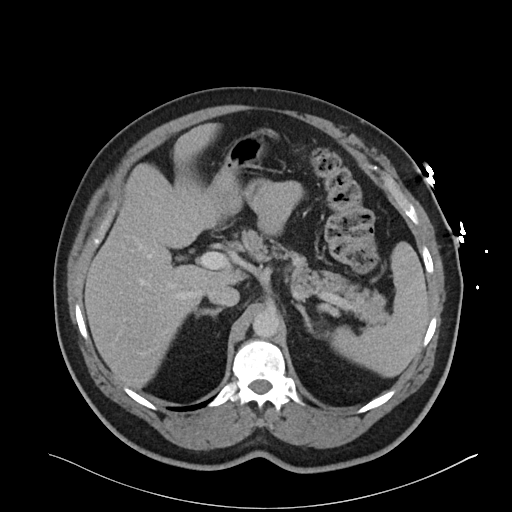
[im 78/93  soft-tissue]
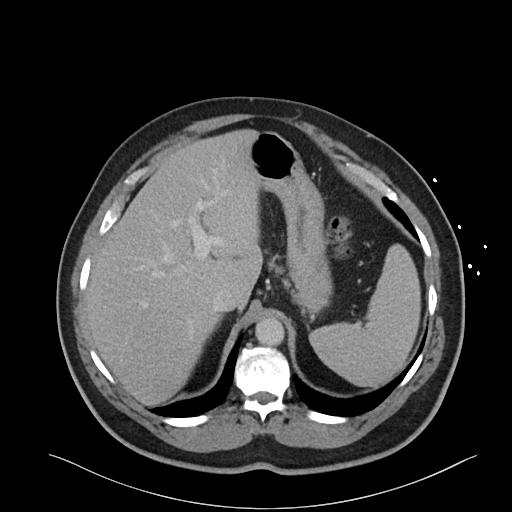
[im 88/93  soft-tissue]
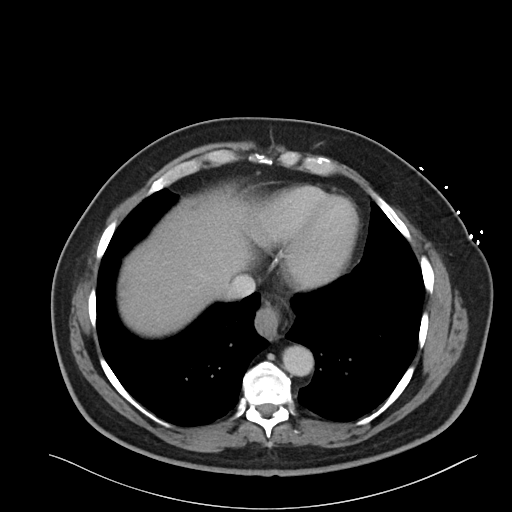

[Series 7: coronal · coronal · 0.82mm/px · 3 of 116 slices shown]
[im 39/116  soft-tissue]
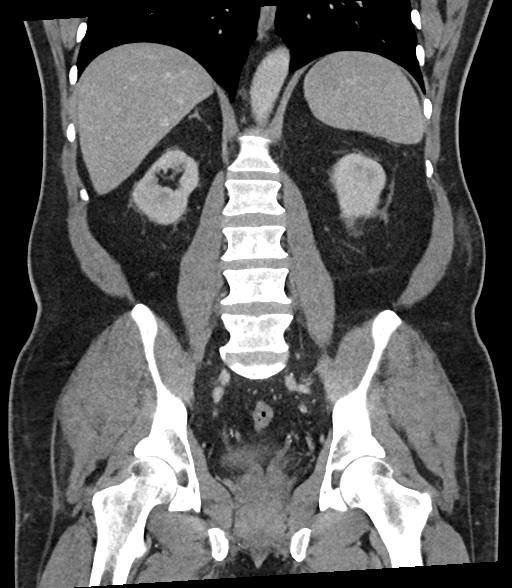
[im 52/116  soft-tissue]
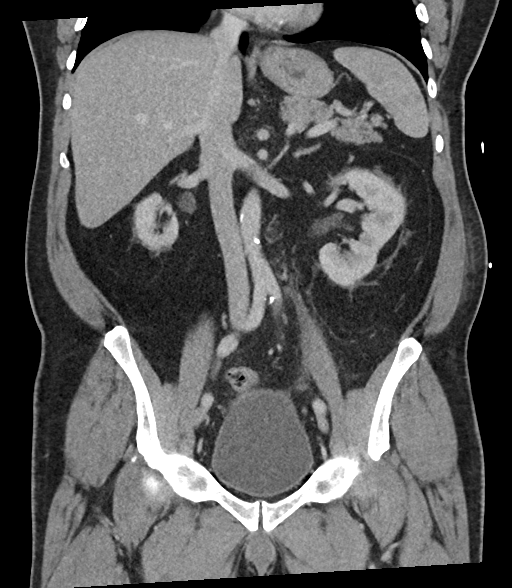
[im 64/116  soft-tissue]
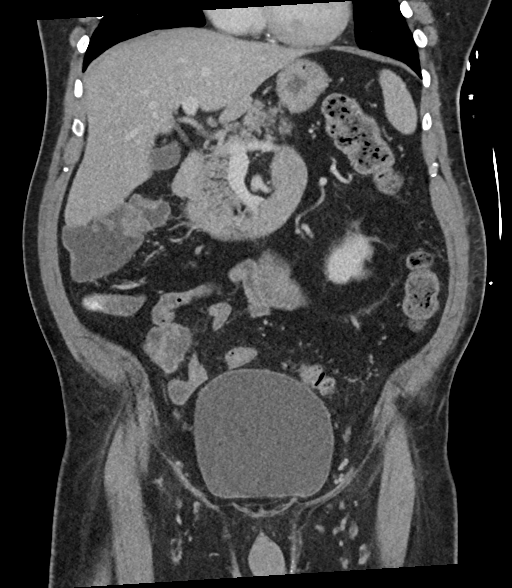

[16 of 46 positions shown; findings below may reference images not displayed]

FINDINGS: Lower chest: Clear lung bases. Normal heart size without pericardial
or pleural effusion. Tiny hiatal hernia.

Hepatobiliary: Normal liver. Normal gallbladder, without biliary
ductal dilatation.

Pancreas: Normal, without mass or ductal dilatation.

Spleen: Normal in size, without focal abnormality.

Adrenals/Urinary Tract: Normal adrenal glands. Right renal cortical
thinning. Normal left kidney. No hydronephrosis. Normal urinary
bladder.

Stomach/Bowel: Normal remainder of the stomach. Normal colon,
appendix, and terminal ileum. Normal small bowel.

Vascular/Lymphatic: Aortic atherosclerosis. No abdominopelvic
adenopathy.

Reproductive: Normal prostate.

Other: No significant free fluid.

Skin thickening and subcutaneous edema about the left anterior
abdominal wall, including on 40/2. No drainable fluid collection or
subcutaneous gas. No extension of inflammation into the
intra-abdominal/intraperitoneal space.

Musculoskeletal: No acute osseous abnormality.
IMPRESSION: 1. Skin thickening and subcutaneous edema about the left anterior
abdominal wall, consistent with cellulitis. No drainable fluid
collection or subcutaneous gas.
2. No acute process in the abdomen or pelvis.
3. Tiny hiatal hernia.
4.  Aortic Atherosclerosis ([id number]).

## 2023-05-16 NOTE — Therapy (Signed)
OUTPATIENT PHYSICAL THERAPY NEURO TREATMENT/Re-CERT    Patient Name: TYCHO VANDEWATER MRN: [id number] DOB:1950/06/24, 72 y.o., male Today's Date: 05/17/2023   PCP: Anson Fret, MD  REFERRING PROVIDER: Ihor Austin, NP   END OF SESSION:  PT End of Session - 05/17/23 1731     Visit Number 11    Number of Visits 15   4 additional visits - Renewal   Date for PT Re-Evaluation 05/17/23   to allow for scheduling delays   Authorization Type [employer] CHO[employer] PPO    Authorization Time Period 10-17 - 05-17-23    Authorization - Visit Number 11    Authorization - Number of Visits 13    PT Start Time 1405    PT Stop Time 1447    PT Time Calculation (min) 42 min    Equipment Utilized During Treatment Gait belt    Activity Tolerance Patient tolerated treatment well   absent seizure towards end of session   Behavior During Therapy WFL for tasks assessed/performed                       Past Medical History:  Diagnosis Date   ADHD (attention deficit hyperactivity disorder)    Diabetes mellitus    Hyperlipidemia    Hypertension    Renal disorder    Seizures (HCC)    Stroke (HCC)    Past Surgical History:  Procedure Laterality Date   IR ANGIO INTRA EXTRACRAN SEL COM CAROTID INNOMINATE BILAT MOD SED  01/03/2021   IR ANGIO VERTEBRAL SEL SUBCLAVIAN INNOMINATE UNI L MOD SED  01/03/2021   IR ANGIO VERTEBRAL SEL VERTEBRAL UNI R MOD SED  01/03/2021   IR RADIOLOGIST EVAL & MGMT  01/13/2021   IR US GUIDE VASC ACCESS RIGHT  01/03/2021   NECK SURGERY     SPINE SURGERY     Cervical spine x 2; Vear Clock; Critzer.   Patient Active Problem List   Diagnosis Date Noted   Benign essential hypertension 11/20/2022   Hypomagnesemia 11/20/2022   Falls 11/20/2022   Obstructive sleep apnea (adult) (pediatric) 11/20/2022   Syncope and collapse 11/20/2022   Uncontrolled type 2 diabetes mellitus with hypoglycemia, with long-term current use of insulin (HCC) 11/20/2022   Acute  encephalopathy 11/20/2022   Hypoglycemia 11/20/2022   TIA (transient ischemic attack) 03/10/2020   CKD (chronic kidney disease) 08/28/2018   Retinopathy of both eyes 08/28/2018   Stroke due to embolism of left vertebral artery (HCC) 08/12/2018   Stroke (HCC) 08/08/2018   Diabetes mellitus (HCC) 07/25/2015   Neck injury 06/22/2015   Hearing loss in right ear 10/14/2011   Agent orange exposure 10/14/2011   ADD (attention deficit disorder) 10/14/2011   Hyperlipidemia 10/14/2011   BMI 28.0-28.9,adult 10/14/2011    ONSET DATE: 03/04/2023 (referral date)  REFERRING DIAG: G81.91 (ICD-10-CM) - Right hemiparesis (HCC)  THERAPY DIAG:  Other abnormalities of gait and mobility  Unsteadiness on feet  Muscle weakness (generalized)  Rationale for Evaluation and Treatment: Rehabilitation  SUBJECTIVE:  SUBJECTIVE STATEMENT:  Pt reports no changes or problems - says blood sugar is 226 today; pt requests to continue with PT for a few more visits if he can (today is last scheduled appt);  continues to do exercises at home   Pt accompanied by: family member: wife, [name]  PERTINENT HISTORY: hx of left cerebellum stroke (March 2020) with residual imbalance/gait impairment and right-sided ataxia, TIA (July 2022), transient AMS possibly in setting of hypoglycemia, left-sided headaches, hypertension, hyperlipidemia, diabetes mellitus, h/o cervical fusion C5-7 (2012), h/o neck injury 2016, stage 3 CKD, and intracranial stenosis   PAIN:  Are you having pain? No   PRECAUTIONS: Fall  RED FLAGS: Bowel or bladder incontinence: No   WEIGHT BEARING RESTRICTIONS: No  FALLS: Has patient fallen in last 6 months? Yes. Number of falls "Too many to remember": "in the last 2 weeks I fell once", has not gone to hospital for  any of these falls, has hit head with one- "fell out of bed and hit head on nightstand" needed help from wife to get away from the bed but stood on own. Had fall in bathroom. Not typically falling when walking, but getting up from sitting. No reports of dizziness or lightheadedness.   LIVING ENVIRONMENT: Lives with: lives with their spouse Lives in: House/apartment Stairs: Yes: External: 1 to porch, 1 to foyer steps; none can use ramp at back of house Has following equipment at home:  "I've got it all"  PLOF: Needs assistance with ADLs- some dressing like little buttons on shirt sleeves or cufflinks, has not driven in over 2 years  PATIENT GOALS: "walk good", "get back to as normal as I can"  OBJECTIVE:  Note: Objective measures were completed at Evaluation unless otherwise noted.  DIAGNOSTIC FINDINGS:   MRI HEAD WITHOUT AND WITH CONTRAST (11/21/2022) IMPRESSION: 1. No acute intracranial abnormality or mass. No specific findings to explain seizures. 2. Unchanged moderate chronic small-vessel disease and central pattern of volume loss.  04/08/2023  5:20 PM repeat MRI WO Contrast   SENSATION: R hand "has gone to sleep", feels like this all the time, pt feels that it is colder than the L , impaired sensation on the top of both feet  EDEMA:  "Feet will swell every once in a while"  LOWER EXTREMITY MMT:    MMT Right Eval Left Eval  Hip flexion 3 4-  Hip extension    Hip abduction    Hip adduction    Hip internal rotation    Hip external rotation    Knee flexion 3 5  Knee extension 3 5  Ankle dorsiflexion 3+ 5  Ankle plantarflexion    Ankle inversion    Ankle eversion    (Blank rows = not tested)   STAIRS: Level of Assistance: CGA and Min A Stair Negotiation Technique: Step to Pattern with Bilateral Rails Number of Stairs: 4  Height of Stairs: standard  Comments: pt w/ increased difficulty descending requiring Min A for balance "I'm not sure I could have done that  without your help"  GAIT: Gait pattern: step through pattern, decreased arm swing- Right, decreased arm swing- Left, decreased step length- Right, decreased step length- Left, decreased hip/knee flexion- Right, decreased ankle dorsiflexion- Right, decreased ankle dorsiflexion- Left, trunk flexed, poor foot clearance- Right, and poor foot clearance- Left Distance walked: various clinical distances Assistive device utilized: None Level of assistance: SBA Comments: consider assessing w/ ADs   TODAY'S TREATMENT:   05-16-23  FGA:  score 19/30   05/16/23 0001  Functional Gait  Assessment  Gait Level Surface 2  Change in Gait Speed 3  Gait with Horizontal Head Turns 2  Gait with Vertical Head Turns 1  Gait and Pivot Turn 3  Step Over Obstacle 3  Gait with Narrow Base of Support 0  Gait with Eyes Closed 1  Ambulating Backwards 2  Steps 2  Total Score 19   Pt negotiated 4 steps x 2 reps with use of 1 rail using step over step sequence; pt able to ascend steps on 2nd rep without use of rail but slight unsteadiness noted and rail recommended for safety - step used with descension on both reps  Gait velocity = 10.16 secs without device = 3.23 ft/sec without device  NeuroRe-ed:  Pt stood in corner on [employer] - feet approx. 6" apart - EO - 30 secs - no LOB:  EC - 30 secs with min. To mod postural sway but no LOB  TUG score 9.22 secs without device  5x sit to stand score 11.03 secs without UE support from mat table  Self Care: Discusssed progress and LTG's; discussed pt's goals for recertification (4 visits); pt declines floor to stand transfer goal, stating he is able to get up off the floor  PATIENT EDUCATION:  Education details: discussed LTG's and progress and goals for renewal Person educated: Patient and Spouse Education method: Explanation Education comprehension: verbalized  understanding and needs further education  HOME EXERCISE PROGRAM:  Access Code: [email or web address] URL: [email or web address] Date: 05/14/2023 Prepared by: Peter Congo  Exercises - Standing Balance in Corner with Eyes Closed  - 1 x daily - 7 x weekly - 1 sets - 5 reps - 30 seconds hold - Romberg Stance with Eyes Closed  - 1 x daily - 7 x weekly - 1 sets - 5 reps - 30 seconds hold - Wide Tandem Stance with Eyes Closed  - 1 x daily - 7 x weekly - 1 sets - 5 reps - 30 seconds hold   GOALS: Goals reviewed with patient? No      LONG TERM GOALS: Target date: 05/02/2023   Pt will improve FGA to at least 14/30 (MCID 4, Fall risk <22) for improved dynamic balance and safety.   Baseline: 6/30 (03/21/23), 12/30 (04/11/23), 20/30 (04/29/23) Goal status: MET  2.  Pt will be independent with final HEP for improved strength, balance, transfers and gait.  Baseline: "they're good, I didn't do any in the shower today because we were running low on time" Goal status: MET  3.  Pt will improve gait velocity to at least 2.50 ft/sec (norm) for improved gait efficiency and safety in the community  Baseline: 1.93 ft/sec (03/21/23), 2.10 ft/sec (04/11/23), 3.95 ft/sec Goal status: MET  4.  Pt will hold romberg stance for 30 seconds with eyes closed on solid ground with no LOB and SBA. Baseline: could hold eyes open (04/11/23), met w/ close SBA (05/01/23) Goal status: MET  NEW LONG TERM GOALS: Target date: 05/17/2023 (last day of POC)  Pt will improve FGA to at least 24/30 (MCID 4, Fall risk <22) for improved dynamic balance and safety.   Baseline: 6/30 (03/21/23), 12/30 (04/11/23), 20/30 (04/29/23);  score 19/30 - 05-16-23  Goal status: Not met (ONGOING)  2.  Trial MCTSIB conditions 3 and 4 and set goal as appropriate  Baseline: 30 secs EO and EC Goal status: Goal met 05-16-23  NEW LONG TERM GOALS:  TARGET DATE 06-21-23  Pt will improve FGA to at least 24/30 (MCID 4, Fall risk <22) for  improved dynamic balance and safety.   Baseline: 6/30 (03/21/23), 12/30 (04/11/23), 20/30 (04/29/23);  score 19/30 - 05-16-23  Goal status: Not met (ONGOING)  2.   Pt will amb. 115' without device with upright posture, with minimal trunk flexion and minimal trunk lean to Rt side.    Baseline:    Goal status:  NEW    3.  Increase gait velocity from 3.23 ft/sec without device to >/= 3.6 ft/sec for increased gait efficiency.    Baseline:  3.23 ft/sec (10.16 secs)   ASSESSMENT:  CLINICAL IMPRESSION:  PT session focused on assessment of LTG's for recertification for 4 additional PT appts.  Pt's FGA score has decreased from 20/30 on 04-29-23 to 19/30 in today's session.  Pt continues to have unsteadiness with amb. With horizontal and with vertical head turns, provoking symptoms prior to occurrence of absent seizure in today's session.  Pt performed fast head shaking (horizontally) per his request "to try something" and pt became labile for approx. 5 secs as he does with onset of absent seizure, but then recovered and full seizure did not occur.  Pt's head movements appear to possibly be correlated to his absent seizure activity.  Pt will benefit from continued PT to address gait and balance deficits and RLE weakness.   Continue POC.   OBJECTIVE IMPAIRMENTS: Abnormal gait, decreased activity tolerance, decreased balance, decreased endurance, decreased knowledge of use of DME, decreased mobility, difficulty walking, decreased ROM, decreased strength, decreased safety awareness, dizziness, hypomobility, impaired flexibility, and impaired sensation.   ACTIVITY LIMITATIONS: lifting, bending, squatting, stairs, transfers, bathing, dressing, and hygiene/grooming  PARTICIPATION LIMITATIONS: meal prep, cleaning, laundry, driving, shopping, community activity, occupation, and yard work  PERSONAL FACTORS: Age, Time since onset of injury/illness/exacerbation, and 3+ comorbidities:    hx left cerebellum stroke,  TIA (July 2022), transient AMS possibly in setting of hypoglycemia, left-sided headaches, hypertension, hyperlipidemia, diabetes mellitus, h/o cervical fusion C5-7 (2012), h/o neck injury 2016, stage 3 CKD, and intracranial stenosis are also affecting patient's functional outcome.   REHAB POTENTIAL: Fair original stroke was in 2020, potentially had a new stroke, balance and gait have declined since last seen at this clinic  CLINICAL DECISION MAKING: Evolving/moderate complexity  EVALUATION COMPLEXITY: Moderate  PLAN:   PT FREQUENCY: 1x/week  PT DURATION: 4 weeks  PLANNED INTERVENTIONS: 97164- PT Re-evaluation, 97110-Therapeutic exercises, 97530- Therapeutic activity, 97112- Neuromuscular re-education, 97535- Self Care, 24580- Manual therapy, [phone]- Gait training, [phone]- Canalith repositioning, 97014- Electrical stimulation (unattended), [phone]- Electrical stimulation (manual), Patient/Family education, Balance training, Stair training, Taping, Dry Needling, Joint mobilization, Joint manipulation, Spinal manipulation, Spinal mobilization, Vestibular training, DME instructions, Cryotherapy, and Moist heat  PLAN FOR NEXT SESSION:   Cont balance and gait training -- RLE strengthening  Review exercises for HEP for RLE & core strengthening, balance, gait training, endurance  Hx of seizures, no blaze pods, ask about blood glucose (low or >300)    Marston Mccadden, Donavan Burnet, PT   05/17/2023, 5:39 PM

## 2023-05-17 ENCOUNTER — Encounter: Payer: Self-pay | Admitting: Physical Therapy

## 2023-05-17 NOTE — Progress Notes (Signed)
   05/16/23 0001  Functional Gait  Assessment  Gait Level Surface 2  Change in Gait Speed 3  Gait with Horizontal Head Turns 2  Gait with Vertical Head Turns 1  Gait and Pivot Turn 3  Step Over Obstacle 3  Gait with Narrow Base of Support 0  Gait with Eyes Closed 1  Ambulating Backwards 2  Steps 2  Total Score 19

## 2023-05-23 ENCOUNTER — Ambulatory Visit: Payer: Medicare PPO | Admitting: Physical Therapy

## 2023-05-23 VITALS — BP 116/69 | HR 75

## 2023-05-23 DIAGNOSIS — R2681 Unsteadiness on feet: Secondary | ICD-10-CM | POA: Diagnosis not present

## 2023-05-23 DIAGNOSIS — R2689 Other abnormalities of gait and mobility: Secondary | ICD-10-CM

## 2023-05-23 DIAGNOSIS — M6281 Muscle weakness (generalized): Secondary | ICD-10-CM | POA: Diagnosis not present

## 2023-05-23 DIAGNOSIS — R293 Abnormal posture: Secondary | ICD-10-CM | POA: Diagnosis not present

## 2023-05-23 NOTE — Therapy (Signed)
OUTPATIENT PHYSICAL THERAPY NEURO TREATMENT   Patient Name: Jeff [name] MRN: [id number] DOB:[birth year], 72 y.o., male Today's Date: 05/24/2023   PCP: Anson Fret, MD  REFERRING PROVIDER: Ihor Austin, NP   END OF SESSION:  PT End of Session - 05/24/23 1329     Visit Number 12    Number of Visits 15   4 additional visits - Renewal   Date for PT Re-Evaluation 05/17/23   to allow for scheduling delays   Authorization Type [facility] MEDICARE CHOICE PPO    Authorization Time Period 10-17 - 05-17-23; 05-16-23 - 07-05-23    Authorization - Number of Visits 13    PT Start Time 1316    PT Stop Time 1402    PT Time Calculation (min) 46 min    Equipment Utilized During Treatment Gait belt    Activity Tolerance Patient tolerated treatment well   absent seizure towards end of session   Behavior During Therapy WFL for tasks assessed/performed                        Past Medical History:  Diagnosis Date   ADHD (attention deficit hyperactivity disorder)    Diabetes mellitus    Hyperlipidemia    Hypertension    Renal disorder    Seizures (HCC)    Stroke (HCC)    Past Surgical History:  Procedure Laterality Date   IR ANGIO INTRA EXTRACRAN SEL COM CAROTID INNOMINATE BILAT MOD SED  01/03/2021   IR ANGIO VERTEBRAL SEL SUBCLAVIAN INNOMINATE UNI L MOD SED  01/03/2021   IR ANGIO VERTEBRAL SEL VERTEBRAL UNI R MOD SED  01/03/2021   IR RADIOLOGIST EVAL & MGMT  01/13/2021   IR US GUIDE VASC ACCESS RIGHT  01/03/2021   NECK SURGERY     SPINE SURGERY     Cervical spine x 2; Vear Clock; Critzer.   Patient Active Problem List   Diagnosis Date Noted   Benign essential hypertension 11/20/2022   Hypomagnesemia 11/20/2022   Falls 11/20/2022   Obstructive sleep apnea (adult) (pediatric) 11/20/2022   Syncope and collapse 11/20/2022   Uncontrolled type 2 diabetes mellitus with hypoglycemia, with long-term current use of insulin (HCC) 11/20/2022   Acute encephalopathy 11/20/2022    Hypoglycemia 11/20/2022   TIA (transient ischemic attack) 03/10/2020   CKD (chronic kidney disease) 08/28/2018   Retinopathy of both eyes 08/28/2018   Stroke due to embolism of left vertebral artery (HCC) 08/12/2018   Stroke (HCC) 08/08/2018   Diabetes mellitus (HCC) 07/25/2015   Neck injury 06/22/2015   Hearing loss in right ear 10/14/2011   Agent orange exposure 10/14/2011   ADD (attention deficit disorder) 10/14/2011   Hyperlipidemia 10/14/2011   BMI 28.0-28.9,adult 10/14/2011    ONSET DATE: 03/04/2023 (referral date)  REFERRING DIAG: G81.91 (ICD-10-CM) - Right hemiparesis (HCC)  THERAPY DIAG:  Other abnormalities of gait and mobility  Unsteadiness on feet  Muscle weakness (generalized)  Rationale for Evaluation and Treatment: Rehabilitation  SUBJECTIVE:  SUBJECTIVE STATEMENT:  Pt reports he felt the best yesterday that he has felt - also feels good today, but says he didn't feel well over the weekend and stayed in bed a lot; doesn't know why - just has days that he doesn't feel well and then recently has started having days that he does feel well - blood sugar 229  Pt accompanied by: family member: wife, [name]  PERTINENT HISTORY: hx of left cerebellum stroke (March 2020) with residual imbalance/gait impairment and right-sided ataxia, TIA (July 2022), transient AMS possibly in setting of hypoglycemia, left-sided headaches, hypertension, hyperlipidemia, diabetes mellitus, h/o cervical fusion C5-7 (2012), h/o neck injury 2016, stage 3 CKD, and intracranial stenosis   PAIN:  Are you having pain? No   PRECAUTIONS: Fall  RED FLAGS: Bowel or bladder incontinence: No   WEIGHT BEARING RESTRICTIONS: No  FALLS: Has patient fallen in last 6 months? Yes. Number of falls "Too many to remember":  "in the last 2 weeks I fell once", has not gone to hospital for any of these falls, has hit head with one- "fell out of bed and hit head on nightstand" needed help from wife to get away from the bed but stood on own. Had fall in bathroom. Not typically falling when walking, but getting up from sitting. No reports of dizziness or lightheadedness.   LIVING ENVIRONMENT: Lives with: lives with their spouse Lives in: House/apartment Stairs: Yes: External: 1 to porch, 1 to foyer steps; none can use ramp at back of house Has following equipment at home:  "I've got it all"  PLOF: Needs assistance with ADLs- some dressing like little buttons on shirt sleeves or cufflinks, has not driven in over 2 years  PATIENT GOALS: "walk good", "get back to as normal as I can"  OBJECTIVE:  Note: Objective measures were completed at Evaluation unless otherwise noted.  DIAGNOSTIC FINDINGS:   MRI HEAD WITHOUT AND WITH CONTRAST (11/21/2022) IMPRESSION: 1. No acute intracranial abnormality or mass. No specific findings to explain seizures. 2. Unchanged moderate chronic small-vessel disease and central pattern of volume loss.  04/08/2023  5:20 PM repeat MRI WO Contrast   SENSATION: R hand "has gone to sleep", feels like this all the time, pt feels that it is colder than the L , impaired sensation on the top of both feet  EDEMA:  "Feet will swell every once in a while"  LOWER EXTREMITY MMT:    MMT Right Eval Left Eval  Hip flexion 3 4-  Hip extension    Hip abduction    Hip adduction    Hip internal rotation    Hip external rotation    Knee flexion 3 5  Knee extension 3 5  Ankle dorsiflexion 3+ 5  Ankle plantarflexion    Ankle inversion    Ankle eversion    (Blank rows = not tested)   STAIRS: Level of Assistance: CGA and Min A Stair Negotiation Technique: Step to Pattern with Bilateral Rails Number of Stairs: 4  Height of Stairs: standard  Comments: pt w/ increased difficulty descending  requiring Min A for balance "I'm not sure I could have done that without your help"  GAIT: Gait pattern: step through pattern, decreased arm swing- Right, decreased arm swing- Left, decreased step length- Right, decreased step length- Left, decreased hip/knee flexion- Right, decreased ankle dorsiflexion- Right, decreased ankle dorsiflexion- Left, trunk flexed, poor foot clearance- Right, and poor foot clearance- Left Distance walked: various clinical distances Assistive device utilized: None Level of assistance:  SBA Comments: consider assessing w/ ADs   TODAY'S TREATMENT:   05-23-23                                                                                                            Gait pattern: step through pattern, decreased arm swing- Right, decreased arm swing- Left, decreased step length- Right, decreased step length- Left, decreased hip/knee flexion- Right, decreased ankle dorsiflexion- Right, decreased ankle dorsiflexion- Left, trunk flexed, poor foot clearance- Right, and poor foot clearance- Left Distance walked: 40' x 4 reps with use of mirror for visual feedback Assistive device utilized: None Level of assistance: SBA Comments: cues for upright, erect posture    NeuroRe-ed: Single limb stance activities - tap ups to 1st step (6") without UE support on rails - 10 reps each foot; to 2nd step 5 reps each foot with UE support prn Standing on inverted Bosu inside // bars with bil. UE support on // bars - anterior/posterior weight shift 10 reps; lateral weight shifts 10 reps with bil. UE support; squats 10 reps with bil. UE support Stepping over balance beam 10 reps each leg; stepping over laterally 10 reps each leg with UE support on // bars prn Sit to stand with feet on Airex 5 reps without UE support Cone taps to 3 cones with UE support prn on // bars  TherEx:  3# weight used on RLE - standing hip flexion, extension, abduction, 1/2 marching 10 reps each direction  Heel  raises 10 reps bil. LE's  Lifting RLE to 2nd step with 3# weight on RLE-  5 reps for Rt hip flexor strengthening  Vitals:   05/23/23 1407  BP: 116/69  Pulse: 75     PATIENT EDUCATION:  Education details: discussed LTG's and progress and goals for renewal Person educated: Patient and Spouse Education method: Explanation Education comprehension: verbalized understanding and needs further education  HOME EXERCISE PROGRAM:  Access Code: [email or web address] URL: [email or web address] Date: 05/14/2023 Prepared by: Peter Congo  Exercises - Standing Balance in Corner with Eyes Closed  - 1 x daily - 7 x weekly - 1 sets - 5 reps - 30 seconds hold - Romberg Stance with Eyes Closed  - 1 x daily - 7 x weekly - 1 sets - 5 reps - 30 seconds hold - Wide Tandem Stance with Eyes Closed  - 1 x daily - 7 x weekly - 1 sets - 5 reps - 30 seconds hold   GOALS: Goals reviewed with patient? No      LONG TERM GOALS: Target date: 05/02/2023   Pt will improve FGA to at least 14/30 (MCID 4, Fall risk <22) for improved dynamic balance and safety.   Baseline: 6/30 (03/21/23), 12/30 (04/11/23), 20/30 (04/29/23) Goal status: MET  2.  Pt will be independent with final HEP for improved strength, balance, transfers and gait.  Baseline: "they're good, I didn't do any in the shower today because we were running low on time" Goal status: MET  3.  Pt will improve gait velocity to at least 2.50  ft/sec (norm) for improved gait efficiency and safety in the community  Baseline: 1.93 ft/sec (03/21/23), 2.10 ft/sec (04/11/23), 3.95 ft/sec Goal status: MET  4.  Pt will hold romberg stance for 30 seconds with eyes closed on solid ground with no LOB and SBA. Baseline: could hold eyes open (04/11/23), met w/ close SBA (05/01/23) Goal status: MET  NEW LONG TERM GOALS: Target date: 05/17/2023 (last day of POC)  Pt will improve FGA to at least 24/30 (MCID 4, Fall risk <22) for improved dynamic balance and  safety.   Baseline: 6/30 (03/21/23), 12/30 (04/11/23), 20/30 (04/29/23);  score 19/30 - 05-16-23  Goal status: Not met (ONGOING)  2.  Trial MCTSIB conditions 3 and 4 and set goal as appropriate  Baseline: 30 secs EO and EC Goal status: Goal met 05-16-23  NEW LONG TERM GOALS:  TARGET DATE 06-21-23   Pt will improve FGA to at least 24/30 (MCID 4, Fall risk <22) for improved dynamic balance and safety.   Baseline: 6/30 (03/21/23), 12/30 (04/11/23), 20/30 (04/29/23);  score 19/30 - 05-16-23  Goal status: Not met (ONGOING)  2.   Pt will amb. 115' without device with upright posture, with minimal trunk flexion and minimal trunk lean to Rt side.    Baseline:    Goal status:  NEW    3.  Increase gait velocity from 3.23 ft/sec without device to >/= 3.6 ft/sec for increased gait efficiency.    Baseline:  3.23 ft/sec (10.16 secs)   ASSESSMENT:  CLINICAL IMPRESSION:  PT session focused on balance training to improve SLS on each leg and gait training without device with cues for upright posture and use of mirror for visual feedback.  Pt's posture during gait training had minimal deviations in today's session compared to that in previous PT sessions.  Pt had less forward lean with more erect trunk and normal BOS.  Pt tolerated exercises well.  Continue POC.   OBJECTIVE IMPAIRMENTS: Abnormal gait, decreased activity tolerance, decreased balance, decreased endurance, decreased knowledge of use of DME, decreased mobility, difficulty walking, decreased ROM, decreased strength, decreased safety awareness, dizziness, hypomobility, impaired flexibility, and impaired sensation.   ACTIVITY LIMITATIONS: lifting, bending, squatting, stairs, transfers, bathing, dressing, and hygiene/grooming  PARTICIPATION LIMITATIONS: meal prep, cleaning, laundry, driving, shopping, community activity, occupation, and yard work  PERSONAL FACTORS: Age, Time since onset of injury/illness/exacerbation, and 3+ comorbidities:    hx  left cerebellum stroke, TIA (July 2022), transient AMS possibly in setting of hypoglycemia, left-sided headaches, hypertension, hyperlipidemia, diabetes mellitus, h/o cervical fusion C5-7 (2012), h/o neck injury 2016, stage 3 CKD, and intracranial stenosis are also affecting patient's functional outcome.   REHAB POTENTIAL: Fair original stroke was in 2020, potentially had a new stroke, balance and gait have declined since last seen at this clinic  CLINICAL DECISION MAKING: Evolving/moderate complexity  EVALUATION COMPLEXITY: Moderate  PLAN:   PT FREQUENCY: 1x/week  PT DURATION: 4 weeks  PLANNED INTERVENTIONS: 97164- PT Re-evaluation, 97110-Therapeutic exercises, 97530- Therapeutic activity, 97112- Neuromuscular re-education, 97535- Self Care, 62952- Manual therapy, [id number]- Gait training, [phone]- Canalith repositioning, 97014- Electrical stimulation (unattended), [phone]- Electrical stimulation (manual), Patient/Family education, Balance training, Stair training, Taping, Dry Needling, Joint mobilization, Joint manipulation, Spinal manipulation, Spinal mobilization, Vestibular training, DME instructions, Cryotherapy, and Moist heat  PLAN FOR NEXT SESSION:   Cont balance and gait training -- RLE strengthening  Review exercises for HEP for RLE & core strengthening, balance, gait training, endurance  Hx of seizures, no blaze pods, ask about blood glucose (low  or >300)    Chelse Matas, Donavan Burnet, PT [facility] [address], [address] [place], [place] [address] [phone] 05/24/2023, 4:58 PM

## 2023-05-24 ENCOUNTER — Encounter: Payer: Self-pay | Admitting: Physical Therapy

## 2023-06-06 ENCOUNTER — Encounter: Payer: Self-pay | Admitting: Physical Therapy

## 2023-06-06 ENCOUNTER — Ambulatory Visit: Payer: Medicare PPO | Attending: Adult Health | Admitting: Physical Therapy

## 2023-06-06 DIAGNOSIS — M6281 Muscle weakness (generalized): Secondary | ICD-10-CM | POA: Insufficient documentation

## 2023-06-06 DIAGNOSIS — R2681 Unsteadiness on feet: Secondary | ICD-10-CM | POA: Diagnosis not present

## 2023-06-06 DIAGNOSIS — R2689 Other abnormalities of gait and mobility: Secondary | ICD-10-CM | POA: Diagnosis not present

## 2023-06-06 NOTE — Therapy (Signed)
 OUTPATIENT PHYSICAL THERAPY NEURO TREATMENT   Patient Name: Jeff [name] MRN: [id number] DOB:June 28, 1950, 73 y.o., male Today's Date: 06/06/2023   PCP: Joshua Bruckner, MD  REFERRING PROVIDER: Whitfield Raisin, NP   END OF SESSION:  PT End of Session - 06/06/23 2047     Visit Number 13    Number of Visits 15   4 additional visits - Renewal   Date for PT Re-Evaluation 07/05/23   to allow for scheduling delays   Authorization Type [facility] MEDICARE CHOICE PPO    Authorization Time Period 10-17 - 05-17-23; 05-16-23 - 07-05-23    Authorization - Visit Number 2    Authorization - Number of Visits 4   4 visits 05-20-23 -07-05-23   PT Start Time 1315    PT Stop Time 1402    PT Time Calculation (min) 47 min    Equipment Utilized During Treatment Gait belt    Activity Tolerance Patient tolerated treatment well   absent seizure towards end of session   Behavior During Therapy [facility] for tasks assessed/performed                         Past Medical History:  Diagnosis Date   ADHD (attention deficit hyperactivity disorder)    Diabetes mellitus    Hyperlipidemia    Hypertension    Renal disorder    Seizures (HCC)    Stroke [facility])    Past Surgical History:  Procedure Laterality Date   IR ANGIO INTRA EXTRACRAN SEL COM CAROTID INNOMINATE BILAT MOD SED  01/03/2021   IR ANGIO VERTEBRAL SEL SUBCLAVIAN INNOMINATE UNI L MOD SED  01/03/2021   IR ANGIO VERTEBRAL SEL VERTEBRAL UNI R MOD SED  01/03/2021   IR RADIOLOGIST EVAL & MGMT  01/13/2021   IR US  GUIDE VASC ACCESS RIGHT  01/03/2021   NECK SURGERY     SPINE SURGERY     Cervical spine x 2; [place]; [facility].   Patient Active Problem List   Diagnosis Date Noted   Benign essential hypertension 11/20/2022   Hypomagnesemia 11/20/2022   Falls 11/20/2022   Obstructive sleep apnea (adult) (pediatric) 11/20/2022   Syncope and collapse 11/20/2022   Uncontrolled type 2 diabetes mellitus with hypoglycemia, with long-term current use of insulin   (HCC) 11/20/2022   Acute encephalopathy 11/20/2022   Hypoglycemia 11/20/2022   TIA (transient ischemic attack) 03/10/2020   CKD (chronic kidney disease) 08/28/2018   Retinopathy of both eyes 08/28/2018   Stroke due to embolism of left vertebral artery (HCC) 08/12/2018   Stroke (HCC) 08/08/2018   Diabetes mellitus (HCC) 07/25/2015   Neck injury 06/22/2015   Hearing loss in right ear 10/14/2011   Agent orange exposure 10/14/2011   ADD (attention deficit disorder) 10/14/2011   Hyperlipidemia 10/14/2011   BMI 28.0-28.9,adult 10/14/2011    ONSET DATE: 03/04/2023 (referral date)  REFERRING DIAG: G81.91 (ICD-10-CM) - Right hemiparesis (HCC)  THERAPY DIAG:  Other abnormalities of gait and mobility  Unsteadiness on feet  Muscle weakness (generalized)  Rationale for Evaluation and Treatment: Rehabilitation  SUBJECTIVE:  SUBJECTIVE STATEMENT:  Pt reports he felt the best yesterday that he has felt - also feels good today, but says he didn't feel well over the weekend and stayed in bed a lot; doesn't know why - just has days that he doesn't feel well and then recently has started having days that he does feel well - blood sugar 229  Pt accompanied by: family member: wife, Devere  PERTINENT HISTORY: hx of left cerebellum stroke (March 2020) with residual imbalance/gait impairment and right-sided ataxia, TIA (July 2022), transient AMS possibly in setting of hypoglycemia, left-sided headaches, hypertension, hyperlipidemia, diabetes mellitus, h/o cervical fusion C5-7 (2012), h/o neck injury 2016, stage 3 CKD, and intracranial stenosis   PAIN:  Are you having pain? No   PRECAUTIONS: Fall  RED FLAGS: Bowel or bladder incontinence: No   WEIGHT BEARING RESTRICTIONS: No  FALLS: Has patient fallen in last 6  months? Yes. Number of falls Too many to remember: in the last 2 weeks I fell once, has not gone to hospital for any of these falls, has hit head with one- fell out of bed and hit head on nightstand needed help from wife to get away from the bed but stood on own. Had fall in bathroom. Not typically falling when walking, but getting up from sitting. No reports of dizziness or lightheadedness.   LIVING ENVIRONMENT: Lives with: lives with their spouse Lives in: House/apartment Stairs: Yes: External: 1 to porch, 1 to foyer steps; none can use ramp at back of house Has following equipment at home:  I've got it all  PLOF: Needs assistance with ADLs- some dressing like little buttons on shirt sleeves or cufflinks, has not driven in over 2 years  PATIENT GOALS: walk good, get back to as normal as I can  OBJECTIVE:  Note: Objective measures were completed at Evaluation unless otherwise noted.  DIAGNOSTIC FINDINGS:   MRI HEAD WITHOUT AND WITH CONTRAST (11/21/2022) IMPRESSION: 1. No acute intracranial abnormality or mass. No specific findings to explain seizures. 2. Unchanged moderate chronic small-vessel disease and central pattern of volume loss.  04/08/2023  5:20 PM repeat MRI WO Contrast   SENSATION: R hand has gone to sleep, feels like this all the time, pt feels that it is colder than the L , impaired sensation on the top of both feet  EDEMA:  Feet will swell every once in a while  LOWER EXTREMITY MMT:    MMT Right Eval Left Eval  Hip flexion 3 4-  Hip extension    Hip abduction    Hip adduction    Hip internal rotation    Hip external rotation    Knee flexion 3 5  Knee extension 3 5  Ankle dorsiflexion 3+ 5  Ankle plantarflexion    Ankle inversion    Ankle eversion    (Blank rows = not tested)   STAIRS: Level of Assistance: CGA and Min A Stair Negotiation Technique: Step to Pattern with Bilateral Rails Number of Stairs: 4  Height of Stairs:  standard  Comments: pt w/ increased difficulty descending requiring Min A for balance I'm not sure I could have done that without your help  GAIT: Gait pattern: step through pattern, decreased arm swing- Right, decreased arm swing- Left, decreased step length- Right, decreased step length- Left, decreased hip/knee flexion- Right, decreased ankle dorsiflexion- Right, decreased ankle dorsiflexion- Left, trunk flexed, poor foot clearance- Right, and poor foot clearance- Left Distance walked: various clinical distances Assistive device utilized: None Level of assistance:  SBA Comments: consider assessing w/ ADs   TODAY'S TREATMENT:   06-06-23                                                                                                            Gait pattern: step through pattern, decreased arm swing- Right, decreased arm swing- Left, decreased step length- Right, decreased step length- Left, decreased hip/knee flexion- Right, decreased ankle dorsiflexion- Right, decreased ankle dorsiflexion- Left, trunk flexed, poor foot clearance- Right, and poor foot clearance- Left Distance walked: 115' x 1, 40' x 4 reps with use of mirror for visual feedback Assistive device utilized: None Level of assistance: SBA Comments: cues for upright, erect posture; pt focusing on maintaining narrow BOS during gait    NeuroRe-ed: Single limb stance activities - tap ups to 1st step (6) without UE support on // bars - 3# weight used on RLE for strengthening- 10 reps   Stepping over balance beam 10 reps with RLE with 3# weight for strengthening:  stepping over laterally 10 reps RLE with 3# weight with UE support on // bars prn Cone taps to 2 cones with UE support prn on // bars Rolling purple ball (medium sized) forward/back with RLE for improved control and SLS on LLE:  performed with LLE for improved SLS on RLE Marching inside // bars 10' forward/backward x 2 reps Rockerboard inside // bars with minimal UE  support 10 reps x 3 sets  TherEx:  3# weight used on RLE - standing hip flexion, extension, abduction, 1/2 marching 10 reps each direction  Heel raises 10 reps bil. LE's    There were no vitals filed for this visit.    PATIENT EDUCATION:  Education details: discussed LTG's and progress and goals for renewal Person educated: Patient and Spouse Education method: Explanation Education comprehension: verbalized understanding and needs further education  HOME EXERCISE PROGRAM:  Access Code: [email or web address] URL: [email or web address] Date: 05/14/2023 Prepared by: Waddell Southgate  Exercises - Standing Balance in Corner with Eyes Closed  - 1 x daily - 7 x weekly - 1 sets - 5 reps - 30 seconds hold - Romberg Stance with Eyes Closed  - 1 x daily - 7 x weekly - 1 sets - 5 reps - 30 seconds hold - Wide Tandem Stance with Eyes Closed  - 1 x daily - 7 x weekly - 1 sets - 5 reps - 30 seconds hold   GOALS: Goals reviewed with patient? No      LONG TERM GOALS: Target date: 05/02/2023   Pt will improve FGA to at least 14/30 (MCID 4, Fall risk <22) for improved dynamic balance and safety.   Baseline: 6/30 (03/21/23), 12/30 (04/11/23), 20/30 (04/29/23) Goal status: MET  2.  Pt will be independent with final HEP for improved strength, balance, transfers and gait.  Baseline: they're good, I didn't do any in the shower today because we were running low on time Goal status: MET  3.  Pt will improve gait velocity to at least 2.50 ft/sec (norm) for improved gait efficiency and  safety in the community  Baseline: 1.93 ft/sec (03/21/23), 2.10 ft/sec (04/11/23), 3.95 ft/sec Goal status: MET  4.  Pt will hold romberg stance for 30 seconds with eyes closed on solid ground with no LOB and SBA. Baseline: could hold eyes open (04/11/23), met w/ close SBA (05/01/23) Goal status: MET  NEW LONG TERM GOALS: Target date: 05/17/2023 (last day of POC)  Pt will improve FGA to at least 24/30 (MCID 4,  Fall risk <22) for improved dynamic balance and safety.   Baseline: 6/30 (03/21/23), 12/30 (04/11/23), 20/30 (04/29/23);  score 19/30 - 05-16-23  Goal status: Not met (ONGOING)  2.  Trial MCTSIB conditions 3 and 4 and set goal as appropriate  Baseline: 30 secs EO and EC Goal status: Goal met 05-16-23  NEW LONG TERM GOALS:  TARGET DATE 06-21-23   Pt will improve FGA to at least 24/30 (MCID 4, Fall risk <22) for improved dynamic balance and safety.   Baseline: 6/30 (03/21/23), 12/30 (04/11/23), 20/30 (04/29/23);  score 19/30 - 05-16-23  Goal status: Not met (ONGOING)  2.   Pt will amb. 115' without device with upright posture, with minimal trunk flexion and minimal trunk lean to Rt side.    Baseline:    Goal status:  NEW    3.  Increase gait velocity from 3.23 ft/sec without device to >/= 3.6 ft/sec for increased gait efficiency.    Baseline:  3.23 ft/sec (10.16 secs)            Goal status:  NEW   ASSESSMENT:  CLINICAL IMPRESSION:  PT session focused on RLE strengthening, balance training to improve SLS and gait training without device with visual cues for more upright posture.  Pt's gait much improved with normal BOS and less trunk lean to Rt side with use of mirror for visual feedback and with focus and attention to task.  Pt did have dyspnea with strengthening exercises with use of 3# weight on RLE, requiring frequent seated rest periods.  Less dyspnea occurred with standing balance activities.  Pt is progressing well towards goals.  Continue POC.   OBJECTIVE IMPAIRMENTS: Abnormal gait, decreased activity tolerance, decreased balance, decreased endurance, decreased knowledge of use of DME, decreased mobility, difficulty walking, decreased ROM, decreased strength, decreased safety awareness, dizziness, hypomobility, impaired flexibility, and impaired sensation.   ACTIVITY LIMITATIONS: lifting, bending, squatting, stairs, transfers, bathing, dressing, and hygiene/grooming  PARTICIPATION  LIMITATIONS: meal prep, cleaning, laundry, driving, shopping, community activity, occupation, and yard work  PERSONAL FACTORS: Age, Time since onset of injury/illness/exacerbation, and 3+ comorbidities:    hx left cerebellum stroke, TIA (July 2022), transient AMS possibly in setting of hypoglycemia, left-sided headaches, hypertension, hyperlipidemia, diabetes mellitus, h/o cervical fusion C5-7 (2012), h/o neck injury 2016, stage 3 CKD, and intracranial stenosis are also affecting patient's functional outcome.   REHAB POTENTIAL: Fair original stroke was in 2020, potentially had a new stroke, balance and gait have declined since last seen at this clinic  CLINICAL DECISION MAKING: Evolving/moderate complexity  EVALUATION COMPLEXITY: Moderate  PLAN:   PT FREQUENCY: 1x/week  PT DURATION: 4 weeks  PLANNED INTERVENTIONS: 97164- PT Re-evaluation, 97110-Therapeutic exercises, 97530- Therapeutic activity, 97112- Neuromuscular re-education, 97535- Self Care, [id number]- Manual therapy, [id number]- Gait training, [id number]- Canalith repositioning, 97014- Electrical stimulation (unattended), [phone]- Electrical stimulation (manual), Patient/Family education, Balance training, Stair training, Taping, Dry Needling, Joint mobilization, Joint manipulation, Spinal manipulation, Spinal mobilization, Vestibular training, DME instructions, Cryotherapy, and Moist heat  PLAN FOR NEXT SESSION:   Cont balance and gait training --  RLE strengthening  Review exercises for HEP for RLE & core strengthening, balance, gait training, endurance  Hx of seizures, no blaze pods, ask about blood glucose (low or >300)    Vitoria Conyer, [place], PT [facility] [address] [place], [place] [address] [phone] 06/06/2023, 8:52 PM

## 2023-06-07 IMAGING — MR MR HEAD W/O CM
6 of 10 series · 27 of 48 positions shown · non-contrast
Comparison: Prior head CT examinations 01/01/2021 and earlier.
Brain MRI 03/10/2020. CT angiogram head/neck 03/10/2020.

CLINICAL DATA: Transient ischemic attack. Additional history
provided: Worsening right-sided leg weakness, fall, difficulty with
speech.

EXAM:
MRI HEAD WITHOUT CONTRAST
TECHNIQUE: Multiplanar, multiecho pulse sequences of the brain and surrounding
structures were obtained without intravenous contrast.

[Series 2: DWI · axial · 3.0mm · 0.94mm/px · z∈[-126,+26]mm · 8 of 106 slices shown (1 of 2)]
[im 1/106]
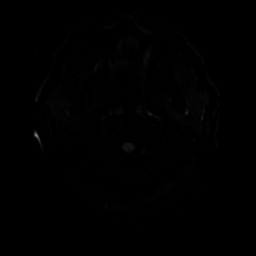
[im 12/106]
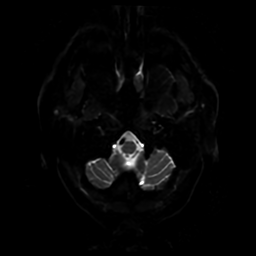
[im 36/106]
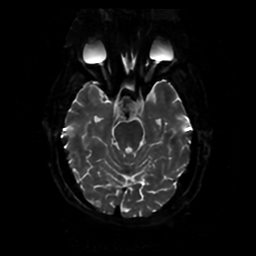
[im 47/106]
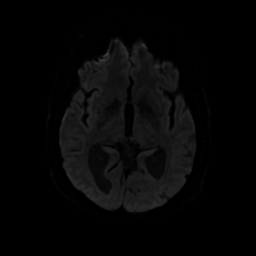
[im 59/106]
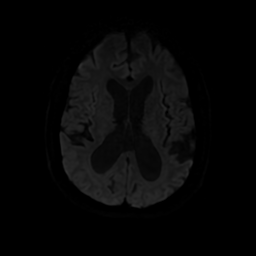
[im 71/106]
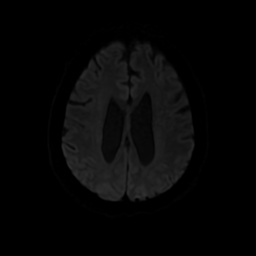
[im 94/106]
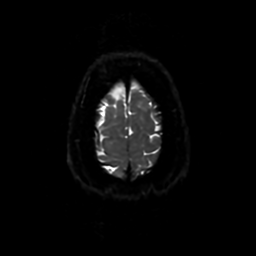
[im 106/106]
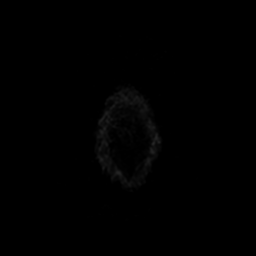

[Series 3: DWI · coronal · 4.0mm · 0.94mm/px · 6 of 74 slices shown (2 of 2)]
[im 1/74]
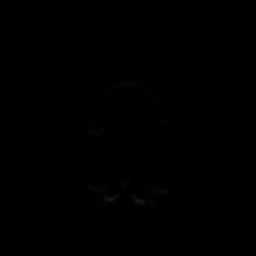
[im 15/74]
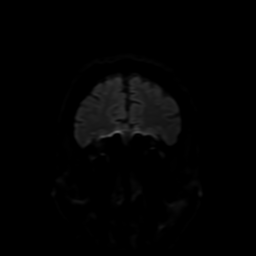
[im 30/74]
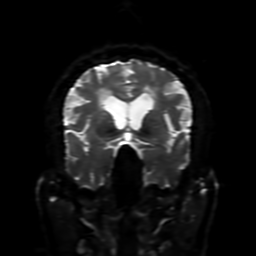
[im 44/74]
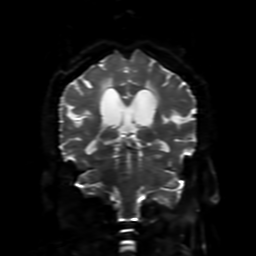
[im 59/74]
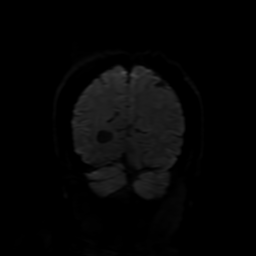
[im 74/74]
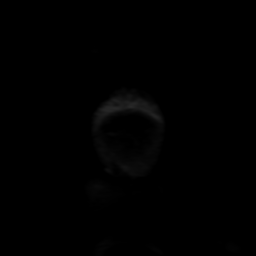

[Series 4: FLAIR · sagittal · 5.0mm · 0.23mm/px · 2 of 25 slices shown (1 of 2)]
[im 1/25]
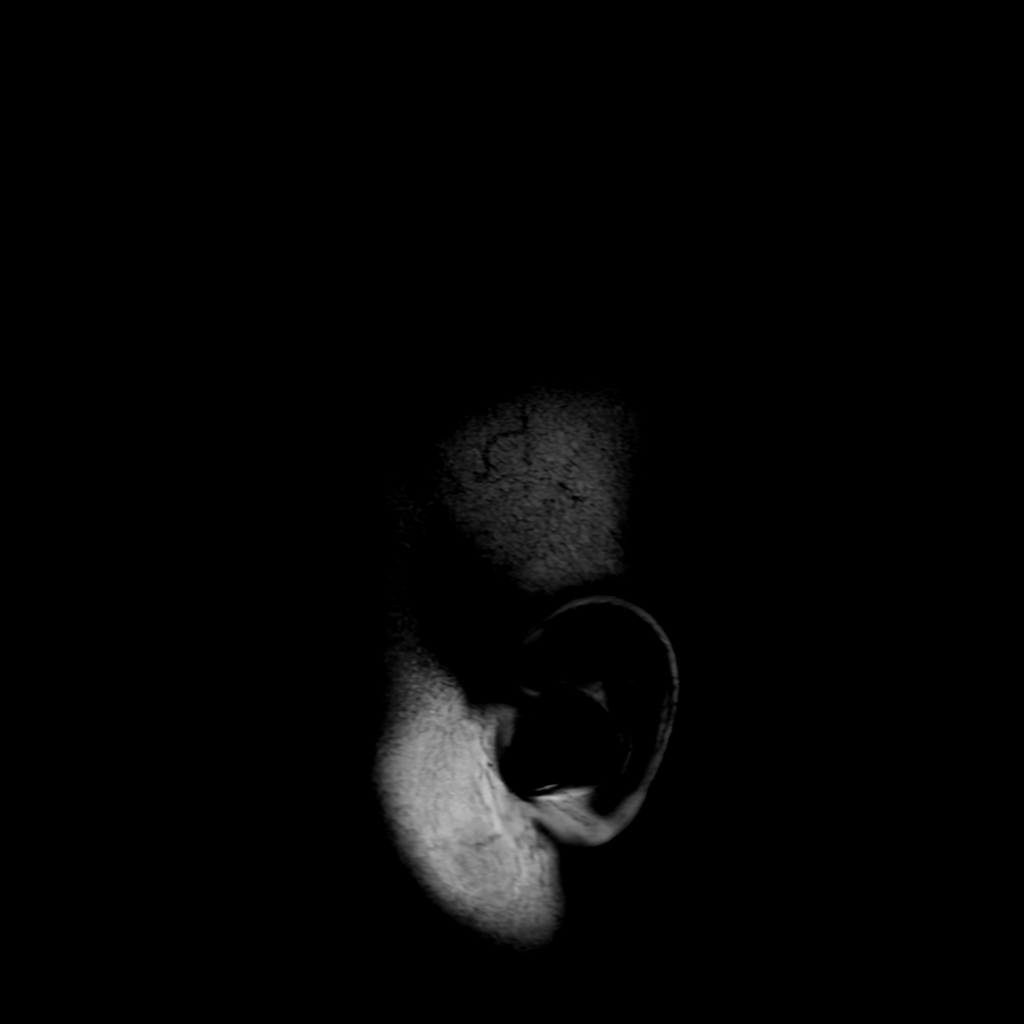
[im 25/25]
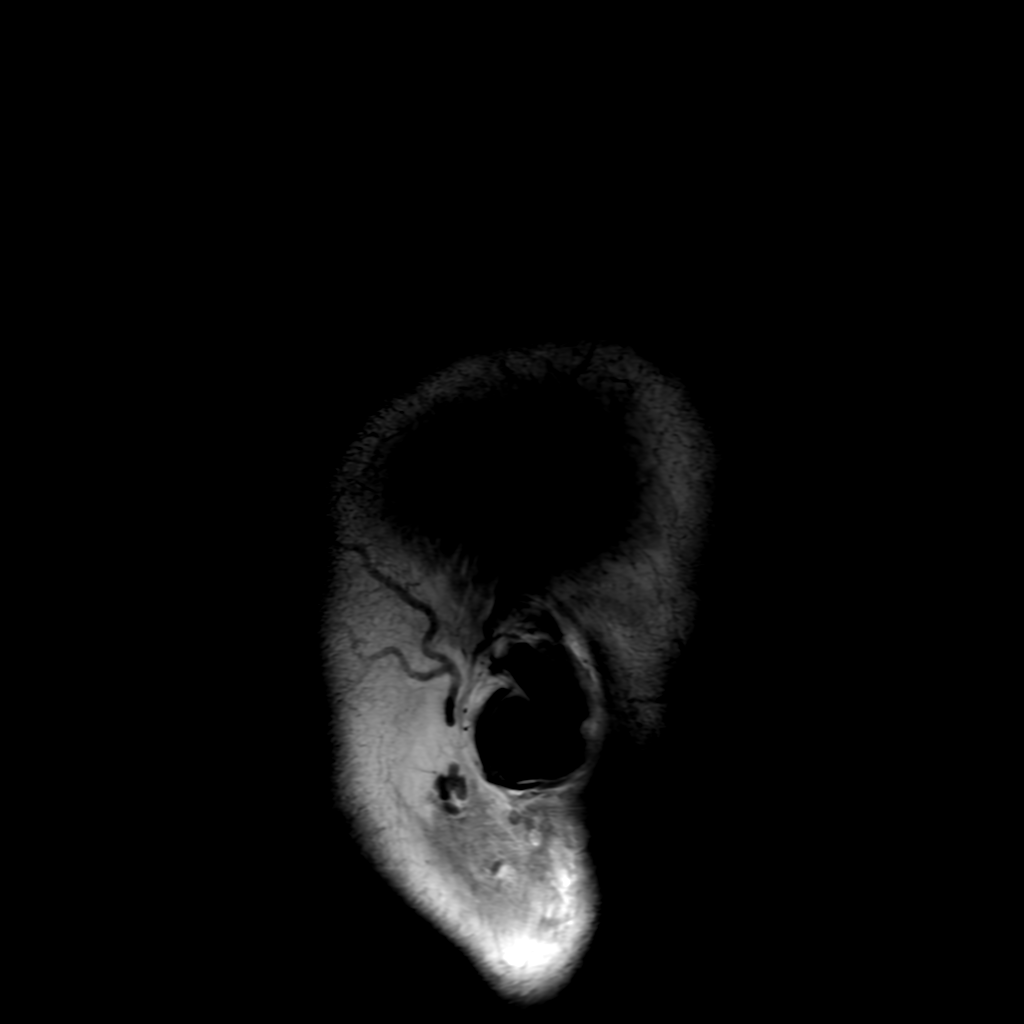

[Series 6: FLAIR · axial · 4.0mm · 0.45mm/px · z∈[-126,+28]mm · 3 of 37 slices shown (2 of 2)]
[im 1/37]
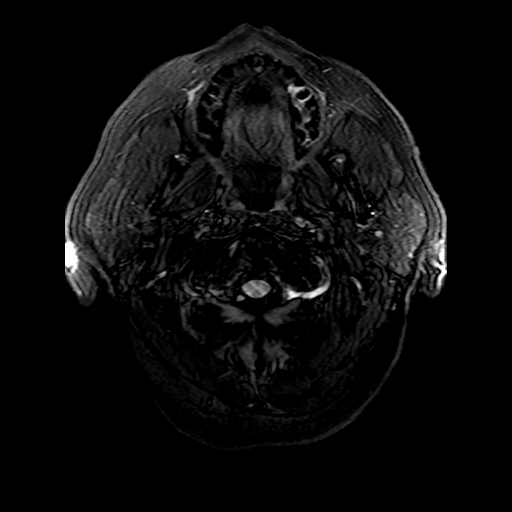
[im 19/37]
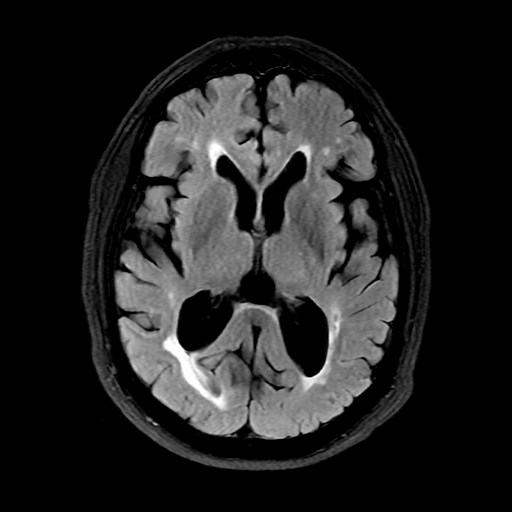
[im 37/37]
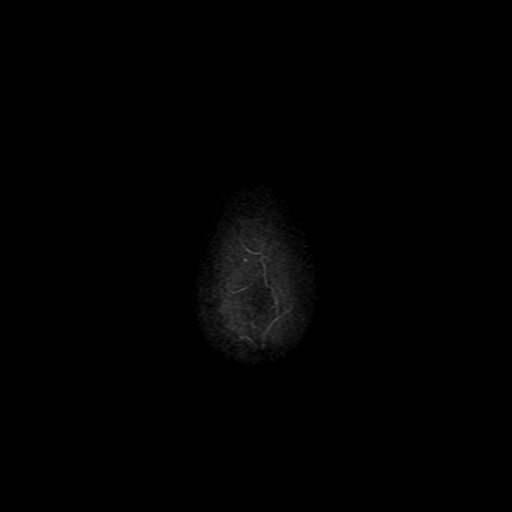

[Series 250: ADC · axial · 3.0mm · 0.94mm/px · z∈[-126,+26]mm · 5 of 53 slices shown (1 of 2)]
[im 1/53]
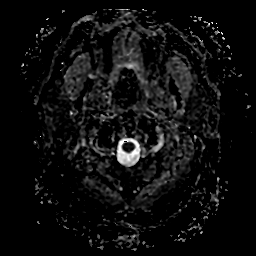
[im 14/53]
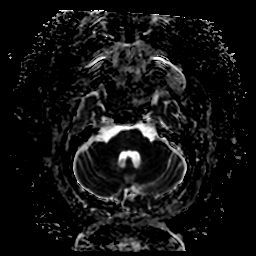
[im 27/53]
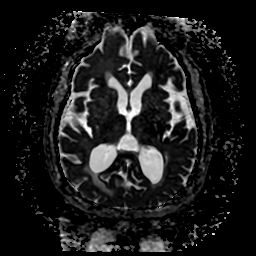
[im 40/53]
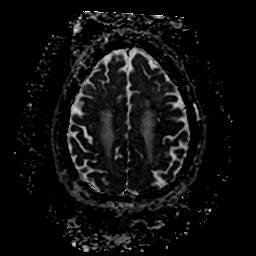
[im 53/53]
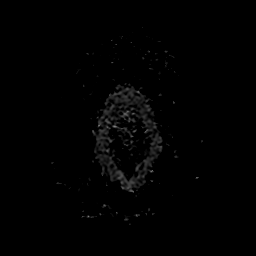

[Series 350: ADC · coronal · 4.0mm · 0.94mm/px · 3 of 37 slices shown (2 of 2)]
[im 1/37]
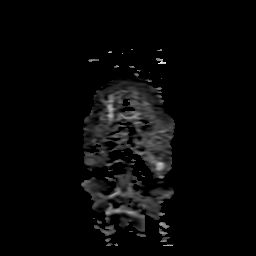
[im 19/37]
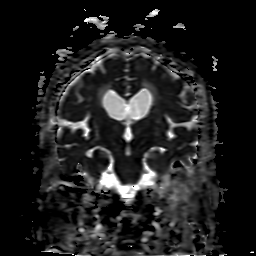
[im 37/37]
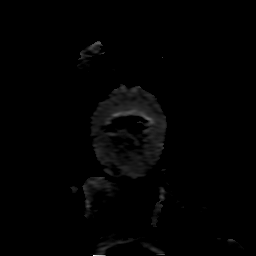

[27 of 48 positions shown; findings below may reference images not displayed]

FINDINGS: Brain:

Moderate central predominant cerebral atrophy.

Moderate multifocal T2/FLAIR hyperintensity within the cerebral
white matter and pons, nonspecific but compatible with chronic small
vessel ischemic disease. Redemonstrated chronic lacunar infarct
within the right basal ganglia.

There is no acute infarct.

No evidence of an intracranial mass.

No chronic intracranial blood products.

No extra-axial fluid collection.

No midline shift.

Vascular: Unchanged signal abnormality within the V4 left vertebral
artery compatible with known chronic occlusion of this vessel. Flow
voids are otherwise maintained within the proximal large arterial
vessels.

Skull and upper cervical spine: No focal marrow lesion.

Sinuses/Orbits: Visualized orbits show no acute finding. Trace
bilateral ethmoid, sphenoid and maxillary sinus mucosal thickening.
IMPRESSION: No evidence of acute intracranial abnormality.

Redemonstrated chronic lacunar infarct within the right basal
ganglia.

Moderate chronic small vessel ischemic changes within the cerebral
white matter and pons, stable as compared to the brain MRI of
03/10/2020.

Moderate central predominant cerebral atrophy, stable.

Known chronic occlusion of the V4 left vertebral artery.

Minimal paranasal sinus disease, as described.

## 2023-06-07 IMAGING — DX DG CHEST 1V PORT
1 series · 1 of 1 positions shown · non-contrast
Comparison: May 30, 2011

CLINICAL DATA: Weakness.  TIA.

EXAM:
PORTABLE CHEST 1 VIEW

[chest]
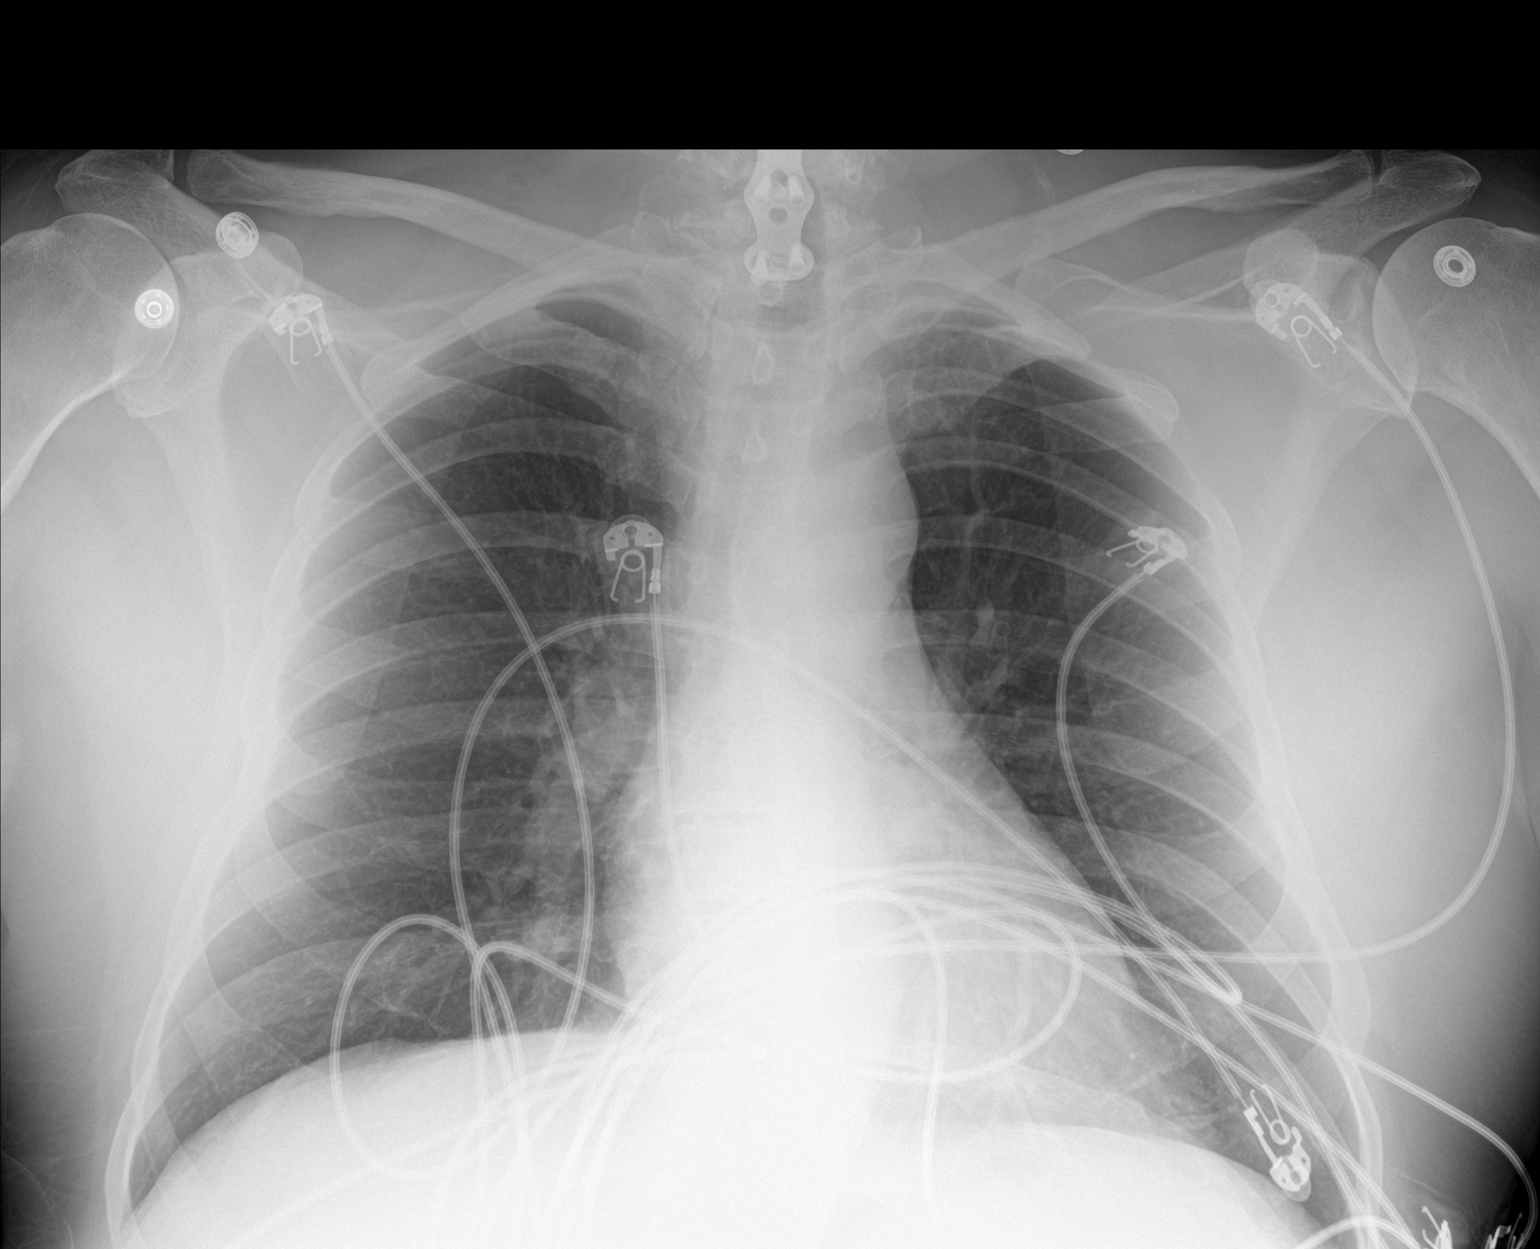

[1 of 1 positions shown; findings below may reference images not displayed]

FINDINGS: The heart size and mediastinal contours are within normal limits.
Both lungs are clear. The visualized skeletal structures are
unremarkable.
IMPRESSION: No active disease.

## 2023-06-13 ENCOUNTER — Ambulatory Visit: Payer: Medicare PPO | Admitting: Physical Therapy

## 2023-06-13 DIAGNOSIS — M6281 Muscle weakness (generalized): Secondary | ICD-10-CM

## 2023-06-13 DIAGNOSIS — R2681 Unsteadiness on feet: Secondary | ICD-10-CM

## 2023-06-13 DIAGNOSIS — R2689 Other abnormalities of gait and mobility: Secondary | ICD-10-CM | POA: Diagnosis not present

## 2023-06-13 NOTE — Therapy (Signed)
 OUTPATIENT PHYSICAL THERAPY NEURO TREATMENT   Patient Name: Jeff [name] MRN: [id number] DOB:[birth year], 73 y.o., male Today's Date: 06/14/2023   PCP: Joshua Bruckner, MD  REFERRING PROVIDER: Whitfield Raisin, NP   END OF SESSION:  PT End of Session - 06/14/23 1430     Visit Number 14    Number of Visits 15   4 additional visits - Renewal   Date for PT Re-Evaluation 07/05/23   to allow for scheduling delays   Authorization Type [facility] MEDICARE CHOICE PPO    Authorization Time Period 10-17 - 05-17-23; 05-16-23 - 07-05-23    Authorization - Visit Number 3    Authorization - Number of Visits 4   4 visits 05-20-23 -07-05-23   PT Start Time 1402    PT Stop Time 1448    PT Time Calculation (min) 46 min    Equipment Utilized During Treatment Gait belt    Activity Tolerance Patient tolerated treatment well   absent seizure towards end of session   Behavior During Therapy [facility] for tasks assessed/performed                          Past Medical History:  Diagnosis Date   ADHD (attention deficit hyperactivity disorder)    Diabetes mellitus    Hyperlipidemia    Hypertension    Renal disorder    Seizures (HCC)    Stroke [facility])    Past Surgical History:  Procedure Laterality Date   IR ANGIO INTRA EXTRACRAN SEL COM CAROTID INNOMINATE BILAT MOD SED  01/03/2021   IR ANGIO VERTEBRAL SEL SUBCLAVIAN INNOMINATE UNI L MOD SED  01/03/2021   IR ANGIO VERTEBRAL SEL VERTEBRAL UNI R MOD SED  01/03/2021   IR RADIOLOGIST EVAL & MGMT  01/13/2021   IR US  GUIDE VASC ACCESS RIGHT  01/03/2021   NECK SURGERY     SPINE SURGERY     Cervical spine x 2; [place]; [facility].   Patient Active Problem List   Diagnosis Date Noted   Benign essential hypertension 11/20/2022   Hypomagnesemia 11/20/2022   Falls 11/20/2022   Obstructive sleep apnea (adult) (pediatric) 11/20/2022   Syncope and collapse 11/20/2022   Uncontrolled type 2 diabetes mellitus with hypoglycemia, with long-term current use of  insulin  (HCC) 11/20/2022   Acute encephalopathy 11/20/2022   Hypoglycemia 11/20/2022   TIA (transient ischemic attack) 03/10/2020   CKD (chronic kidney disease) 08/28/2018   Retinopathy of both eyes 08/28/2018   Stroke due to embolism of left vertebral artery (HCC) 08/12/2018   Stroke (HCC) 08/08/2018   Diabetes mellitus (HCC) 07/25/2015   Neck injury 06/22/2015   Hearing loss in right ear 10/14/2011   Agent orange exposure 10/14/2011   ADD (attention deficit disorder) 10/14/2011   Hyperlipidemia 10/14/2011   BMI 28.0-28.9,adult 10/14/2011    ONSET DATE: 03/04/2023 (referral date)  REFERRING DIAG: G81.91 (ICD-10-CM) - Right hemiparesis (HCC)  THERAPY DIAG:  Other abnormalities of gait and mobility  Unsteadiness on feet  Muscle weakness (generalized)  Rationale for Evaluation and Treatment: Rehabilitation  SUBJECTIVE:  SUBJECTIVE STATEMENT:  Pt reports he is very sore today because he fell out of bed onto floor yesterday morning when phone rang (reminder call for today's PT appt) at 8:00 - states he was asleep and rolled over to answer the phone and didn't know he was so close to edge of bed - fell onto knees and elbows - both are very sore and knees are bruised.  Blood sugar reading 329 at start of session.  Pt accompanied by: family member: wife, Devere  PERTINENT HISTORY: hx of left cerebellum stroke (March 2020) with residual imbalance/gait impairment and right-sided ataxia, TIA (July 2022), transient AMS possibly in setting of hypoglycemia, left-sided headaches, hypertension, hyperlipidemia, diabetes mellitus, h/o cervical fusion C5-7 (2012), h/o neck injury 2016, stage 3 CKD, and intracranial stenosis   PAIN:  Are you having pain? Yes:  pt reports pain intensity 7/10 intensity in bil.  Knees and elbows due to fall onto floor yesterday morning  PRECAUTIONS: Fall  RED FLAGS: Bowel or bladder incontinence: No   WEIGHT BEARING RESTRICTIONS: No  FALLS: Has patient fallen in last 6 months? Yes. Number of falls Too many to remember: in the last 2 weeks I fell once, has not gone to hospital for any of these falls, has hit head with one- fell out of bed and hit head on nightstand needed help from wife to get away from the bed but stood on own. Had fall in bathroom. Not typically falling when walking, but getting up from sitting. No reports of dizziness or lightheadedness.   LIVING ENVIRONMENT: Lives with: lives with their spouse Lives in: House/apartment Stairs: Yes: External: 1 to porch, 1 to foyer steps; none can use ramp at back of house Has following equipment at home:  I've got it all  PLOF: Needs assistance with ADLs- some dressing like little buttons on shirt sleeves or cufflinks, has not driven in over 2 years  PATIENT GOALS: walk good, get back to as normal as I can  OBJECTIVE:  Note: Objective measures were completed at Evaluation unless otherwise noted.  DIAGNOSTIC FINDINGS:   MRI HEAD WITHOUT AND WITH CONTRAST (11/21/2022) IMPRESSION: 1. No acute intracranial abnormality or mass. No specific findings to explain seizures. 2. Unchanged moderate chronic small-vessel disease and central pattern of volume loss.  04/08/2023  5:20 PM repeat MRI WO Contrast   SENSATION: R hand has gone to sleep, feels like this all the time, pt feels that it is colder than the L , impaired sensation on the top of both feet  EDEMA:  Feet will swell every once in a while  LOWER EXTREMITY MMT:    MMT Right Eval Left Eval  Hip flexion 3 4-  Hip extension    Hip abduction    Hip adduction    Hip internal rotation    Hip external rotation    Knee flexion 3 5  Knee extension 3 5  Ankle dorsiflexion 3+ 5  Ankle plantarflexion    Ankle inversion    Ankle  eversion    (Blank rows = not tested)   STAIRS: Level of Assistance: CGA and Min A Stair Negotiation Technique: Step to Pattern with Bilateral Rails Number of Stairs: 4  Height of Stairs: standard  Comments: pt w/ increased difficulty descending requiring Min A for balance I'm not sure I could have done that without your help  GAIT: Gait pattern: step through pattern, decreased arm swing- Right, decreased arm swing- Left, decreased step length- Right, decreased step length- Left, decreased  hip/knee flexion- Right, decreased ankle dorsiflexion- Right, decreased ankle dorsiflexion- Left, trunk flexed, poor foot clearance- Right, and poor foot clearance- Left Distance walked: various clinical distances Assistive device utilized: None Level of assistance: SBA Comments: consider assessing w/ ADs   TODAY'S TREATMENT:   06-13-23                                                                                                            TherEx: Scifit level 2.0 x 5 with UE's and LE's for joint ROM and flexibility Heel raises 10 reps bil. LE's Sit to stand transfers from mat without UE support - 5 times  GAIT: Gait pattern: step through pattern, decreased arm swing- Right, decreased arm swing- Left, decreased step length- Right, decreased step length- Left, decreased hip/knee flexion- Right, decreased ankle dorsiflexion- Right, decreased ankle dorsiflexion- Left, trunk flexed, poor foot clearance- Right, and poor foot clearance- Left Distance walked: 115' x 2 reps - seated rest break between reps;  10' x 4 reps inside // bars (forwards/backwards x 2 reps) - no UE support on // bars used  Assistive device utilized: None Level of assistance: SBA Comments: cues for upright, erect posture; pt focusing on maintaining narrow BOS during gait    NeuroRe-ed: Single limb stance activities - tap ups to 1st step (6) without UE support on // bars - 3# weight used on RLE for strengthening- 10 reps   Alternate tap ups to 6 step placed inside // bars - 10 reps each leg with UE support prn Stepping over balance beam 10 reps with RLE Cone taps to 2 cones with UE support prn on // bars Marching inside // bars 10' forward/backward x 2 reps Rockerboard inside // bars with minimal UE support 10 reps x 2 sets  - rocking forward - stepping down to floor 5 reps with RLE and 5 reps with LLE       PATIENT EDUCATION:  Education details: discussed LTG's and progress and goals for renewal Person educated: Patient and Spouse Education method: Explanation Education comprehension: verbalized understanding and needs further education  HOME EXERCISE PROGRAM:  Access Code: [email or web address] URL: [email or web address] Date: 05/14/2023 Prepared by: Waddell Southgate  Exercises - Standing Balance in Corner with Eyes Closed  - 1 x daily - 7 x weekly - 1 sets - 5 reps - 30 seconds hold - Romberg Stance with Eyes Closed  - 1 x daily - 7 x weekly - 1 sets - 5 reps - 30 seconds hold - Wide Tandem Stance with Eyes Closed  - 1 x daily - 7 x weekly - 1 sets - 5 reps - 30 seconds hold   GOALS: Goals reviewed with patient? No      LONG TERM GOALS: Target date: 05/02/2023   Pt will improve FGA to at least 14/30 (MCID 4, Fall risk <22) for improved dynamic balance and safety.   Baseline: 6/30 (03/21/23), 12/30 (04/11/23), 20/30 (04/29/23) Goal status: MET  2.  Pt will be independent with final HEP for improved strength, balance, transfers and gait.  Baseline: they're good, I didn't do any in the shower today because we were running low on time Goal status: MET  3.  Pt will improve gait velocity to at least 2.50 ft/sec (norm) for improved gait efficiency and safety in the community  Baseline: 1.93 ft/sec (03/21/23), 2.10 ft/sec (04/11/23), 3.95 ft/sec Goal status: MET  4.  Pt will hold romberg stance for 30 seconds with eyes closed on solid ground with no LOB and SBA. Baseline: could hold eyes  open (04/11/23), met w/ close SBA (05/01/23) Goal status: MET  NEW LONG TERM GOALS: Target date: 05/17/2023 (last day of POC)  Pt will improve FGA to at least 24/30 (MCID 4, Fall risk <22) for improved dynamic balance and safety.   Baseline: 6/30 (03/21/23), 12/30 (04/11/23), 20/30 (04/29/23);  score 19/30 - 05-16-23  Goal status: Not met (ONGOING)  2.  Trial MCTSIB conditions 3 and 4 and set goal as appropriate  Baseline: 30 secs EO and EC Goal status: Goal met 05-16-23  NEW LONG TERM GOALS:  TARGET DATE 06-21-23   Pt will improve FGA to at least 24/30 (MCID 4, Fall risk <22) for improved dynamic balance and safety.   Baseline: 6/30 (03/21/23), 12/30 (04/11/23), 20/30 (04/29/23);  score 19/30 - 05-16-23  Goal status: Not met (ONGOING)  2.   Pt will amb. 115' without device with upright posture, with minimal trunk flexion and minimal trunk lean to Rt side.    Baseline:    Goal status:  NEW    3.  Increase gait velocity from 3.23 ft/sec without device to >/= 3.6 ft/sec for increased gait efficiency.    Baseline:  3.23 ft/sec (10.16 secs)            Goal status:  NEW   ASSESSMENT:  CLINICAL IMPRESSION:  PT session focused on gait and balance training.  Pt demonstrated significant gait and posture deviations at start of session due to soreness in bil. Elbows and knees due to falling out of bed and onto elbows and knees after rolling out of bed onto floor when turning over to answer phone while asleep on morning of 06-11-22.  Pt's gait and mobility improved at end of session with pt reporting less pain due to improved flexibility and less stiffness.  Plan D/C after next scheduled PT appt. Continue POC.   OBJECTIVE IMPAIRMENTS: Abnormal gait, decreased activity tolerance, decreased balance, decreased endurance, decreased knowledge of use of DME, decreased mobility, difficulty walking, decreased ROM, decreased strength, decreased safety awareness, dizziness, hypomobility, impaired flexibility,  and impaired sensation.   ACTIVITY LIMITATIONS: lifting, bending, squatting, stairs, transfers, bathing, dressing, and hygiene/grooming  PARTICIPATION LIMITATIONS: meal prep, cleaning, laundry, driving, shopping, community activity, occupation, and yard work  PERSONAL FACTORS: Age, Time since onset of injury/illness/exacerbation, and 3+ comorbidities:    hx left cerebellum stroke, TIA (July 2022), transient AMS possibly in setting of hypoglycemia, left-sided headaches, hypertension, hyperlipidemia, diabetes mellitus, h/o cervical fusion C5-7 (2012), h/o neck injury 2016, stage 3 CKD, and intracranial stenosis are also affecting patient's functional outcome.   REHAB POTENTIAL: Fair original stroke was in 2020, potentially had a new stroke, balance and gait have declined since last seen at this clinic  CLINICAL DECISION MAKING: Evolving/moderate complexity  EVALUATION COMPLEXITY: Moderate  PLAN:   PT FREQUENCY: 1x/week  PT DURATION: 4 weeks  PLANNED INTERVENTIONS: 97164- PT Re-evaluation, 97110-Therapeutic exercises, 97530- Therapeutic activity, 97112- Neuromuscular re-education, 97535- Self Care, [id number]- Manual therapy, [id number]- Gait training, [phone]- Canalith repositioning, 97014- Electrical stimulation (unattended), [id number]-  Electrical stimulation (manual), Patient/Family education, Balance training, Stair training, Taping, Dry Needling, Joint mobilization, Joint manipulation, Spinal manipulation, Spinal mobilization, Vestibular training, DME instructions, Cryotherapy, and Moist heat  PLAN FOR NEXT SESSION:   Check LTG's and D/C     Fatih Stalvey, [place], PT [facility] [address], [address] [place], [place] [address] [phone] 06/14/2023, 2:32 PM

## 2023-06-14 ENCOUNTER — Encounter: Payer: Self-pay | Admitting: Physical Therapy

## 2023-06-18 ENCOUNTER — Ambulatory Visit: Payer: Medicare PPO | Admitting: Physical Therapy

## 2023-06-18 DIAGNOSIS — M6281 Muscle weakness (generalized): Secondary | ICD-10-CM | POA: Diagnosis not present

## 2023-06-18 DIAGNOSIS — R2689 Other abnormalities of gait and mobility: Secondary | ICD-10-CM | POA: Diagnosis not present

## 2023-06-18 DIAGNOSIS — R2681 Unsteadiness on feet: Secondary | ICD-10-CM | POA: Diagnosis not present

## 2023-06-18 NOTE — Therapy (Signed)
 OUTPATIENT PHYSICAL THERAPY NEURO TREATMENT/DISCHARGE SUMMARY   Patient Name: Jeff [name] MRN: [id number] DOB:1950-07-31, 73 y.o., male Today's Date: 06/19/2023   PCP: Joshua Bruckner, MD  REFERRING PROVIDER: Whitfield Raisin, NP   END OF SESSION:  PT End of Session - 06/19/23 1930     Visit Number 15    Number of Visits 15   4 additional visits - Renewal   Date for PT Re-Evaluation 07/05/23   to allow for scheduling delays   Authorization Type [facility] MEDICARE CHOICE PPO    Authorization Time Period 10-17 - 05-17-23; 05-16-23 - 07-05-23    Authorization - Visit Number 4    Authorization - Number of Visits 4   4 visits 05-20-23 -07-05-23   PT Start Time 1528    PT Stop Time 1620    PT Time Calculation (min) 52 min    Equipment Utilized During Treatment Gait belt    Activity Tolerance Patient tolerated treatment well   absent seizure towards end of session   Behavior During Therapy [facility] for tasks assessed/performed                           Past Medical History:  Diagnosis Date   ADHD (attention deficit hyperactivity disorder)    Diabetes mellitus    Hyperlipidemia    Hypertension    Renal disorder    Seizures (HCC)    Stroke [facility])    Past Surgical History:  Procedure Laterality Date   IR ANGIO INTRA EXTRACRAN SEL COM CAROTID INNOMINATE BILAT MOD SED  01/03/2021   IR ANGIO VERTEBRAL SEL SUBCLAVIAN INNOMINATE UNI L MOD SED  01/03/2021   IR ANGIO VERTEBRAL SEL VERTEBRAL UNI R MOD SED  01/03/2021   IR RADIOLOGIST EVAL & MGMT  01/13/2021   IR US  GUIDE VASC ACCESS RIGHT  01/03/2021   NECK SURGERY     SPINE SURGERY     Cervical spine x 2; [place]; [facility].   Patient Active Problem List   Diagnosis Date Noted   Benign essential hypertension 11/20/2022   Hypomagnesemia 11/20/2022   Falls 11/20/2022   Obstructive sleep apnea (adult) (pediatric) 11/20/2022   Syncope and collapse 11/20/2022   Uncontrolled type 2 diabetes mellitus with hypoglycemia, with long-term  current use of insulin  (HCC) 11/20/2022   Acute encephalopathy 11/20/2022   Hypoglycemia 11/20/2022   TIA (transient ischemic attack) 03/10/2020   CKD (chronic kidney disease) 08/28/2018   Retinopathy of both eyes 08/28/2018   Stroke due to embolism of left vertebral artery (HCC) 08/12/2018   Stroke (HCC) 08/08/2018   Diabetes mellitus (HCC) 07/25/2015   Neck injury 06/22/2015   Hearing loss in right ear 10/14/2011   Agent orange exposure 10/14/2011   ADD (attention deficit disorder) 10/14/2011   Hyperlipidemia 10/14/2011   BMI 28.0-28.9,adult 10/14/2011    ONSET DATE: 03/04/2023 (referral date)  REFERRING DIAG: G81.91 (ICD-10-CM) - Right hemiparesis (HCC)  THERAPY DIAG:  Other abnormalities of gait and mobility  Unsteadiness on feet  Muscle weakness (generalized)  Rationale for Evaluation and Treatment: Rehabilitation  SUBJECTIVE:  SUBJECTIVE STATEMENT:  Pt reports he is doing well today - feels much better than he did last week (is no longer having the soreness he was having at previous visit after falling out of bed); pt ready for discharge.   Blood sugar level 219 at start of session  Pt accompanied by: family member: wife, Devere  PERTINENT HISTORY: hx of left cerebellum stroke (March 2020) with residual imbalance/gait impairment and right-sided ataxia, TIA (July 2022), transient AMS possibly in setting of hypoglycemia, left-sided headaches, hypertension, hyperlipidemia, diabetes mellitus, h/o cervical fusion C5-7 (2012), h/o neck injury 2016, stage 3 CKD, and intracranial stenosis   PAIN:  Are you having pain? Yes:  pt reports pain intensity 7/10 intensity in bil. Knees and elbows due to fall onto floor yesterday morning  PRECAUTIONS: Fall  RED FLAGS: Bowel or bladder incontinence:  No   WEIGHT BEARING RESTRICTIONS: No  FALLS: Has patient fallen in last 6 months? Yes. Number of falls Too many to remember: in the last 2 weeks I fell once, has not gone to hospital for any of these falls, has hit head with one- fell out of bed and hit head on nightstand needed help from wife to get away from the bed but stood on own. Had fall in bathroom. Not typically falling when walking, but getting up from sitting. No reports of dizziness or lightheadedness.   LIVING ENVIRONMENT: Lives with: lives with their spouse Lives in: House/apartment Stairs: Yes: External: 1 to porch, 1 to foyer steps; none can use ramp at back of house Has following equipment at home:  I've got it all  PLOF: Needs assistance with ADLs- some dressing like little buttons on shirt sleeves or cufflinks, has not driven in over 2 years  PATIENT GOALS: walk good, get back to as normal as I can  OBJECTIVE:  Note: Objective measures were completed at Evaluation unless otherwise noted.  DIAGNOSTIC FINDINGS:   MRI HEAD WITHOUT AND WITH CONTRAST (11/21/2022) IMPRESSION: 1. No acute intracranial abnormality or mass. No specific findings to explain seizures. 2. Unchanged moderate chronic small-vessel disease and central pattern of volume loss.  04/08/2023  5:20 PM repeat MRI WO Contrast   SENSATION: R hand has gone to sleep, feels like this all the time, pt feels that it is colder than the L , impaired sensation on the top of both feet  EDEMA:  Feet will swell every once in a while  LOWER EXTREMITY MMT:    MMT Right Eval Left Eval  Hip flexion 3 4-  Hip extension    Hip abduction    Hip adduction    Hip internal rotation    Hip external rotation    Knee flexion 3 5  Knee extension 3 5  Ankle dorsiflexion 3+ 5  Ankle plantarflexion    Ankle inversion    Ankle eversion    (Blank rows = not tested)   STAIRS: Level of Assistance: CGA and Min A Stair Negotiation Technique: Step to  Pattern with Bilateral Rails Number of Stairs: 4  Height of Stairs: standard  Comments: pt w/ increased difficulty descending requiring Min A for balance I'm not sure I could have done that without your help  GAIT: Gait pattern: step through pattern, decreased arm swing- Right, decreased arm swing- Left, decreased step length- Right, decreased step length- Left, decreased hip/knee flexion- Right, decreased ankle dorsiflexion- Right, decreased ankle dorsiflexion- Left, trunk flexed, poor foot clearance- Right, and poor foot clearance- Left Distance walked: various clinical distances Assistive  device utilized: None Level of assistance: SBA Comments: consider assessing w/ ADs   TODAY'S TREATMENT:   06-18-23                                                                                                            Self care: discussed LTG's and progress; scores from previous assessments compared and discussed with pt & wife; discussed D/C plan with option to return to PT in future (if needed) with new referral from MD    TherEx:  Sit to stand transfers from mat without UE support - 3 reps  GAIT: Gait pattern: step through pattern, decreased arm swing- Right, decreased arm swing- Left, decreased step length- Right, decreased step length- Left, decreased hip/knee flexion- Right, decreased ankle dorsiflexion- Right, decreased ankle dorsiflexion- Left, trunk flexed, poor foot clearance- Right, and poor foot clearance- Left Distance walked: 115' x 1 rep  Assistive device utilized: None Level of assistance: SBA Comments: cues for upright, erect posture; pt focusing on maintaining narrow BOS during gait  FGA:    06/18/23 0001  Functional Gait  Assessment  Gait Level Surface 3  Change in Gait Speed 3  Gait with Horizontal Head Turns 3  Gait with Vertical Head Turns 2  Gait and Pivot Turn 3  Step Over Obstacle 3  Gait with Narrow Base of Support 0  Gait with Eyes Closed 2  Ambulating  Backwards 3  Steps 2  Total Score 24   Gait velocity =10.28 secs, 9.87 secs =  3.32 ft/sec without device  NeuroRe-ed:  TUG score = 10.41 secs no device     PATIENT EDUCATION:  Education details: discussed LTG's and progress and goals for renewal Person educated: Patient and Spouse Education method: Explanation Education comprehension: verbalized understanding and needs further education  HOME EXERCISE PROGRAM:  Access Code: [email or web address] URL: [email or web address] Date: 05/14/2023 Prepared by: Waddell Devon-  NEW LONG TERM GOALS: Target date: 05/17/2023 (last day of POC)  Pt will improve FGA to at least 24/30 (MCID 4, Fall risk <22) for improved dynamic balance and safety.   Baseline: 6/30 (03/21/23), 12/30 (04/11/23), 20/30 (04/29/23);  score 19/30 - 05-16-23  Goal status: Not met (ONGOING)  2.  Trial MCTSIB conditions 3 and 4 and set goal as appropriate  Baseline: 30 secs EO and EC Goal status: Goal met 05-16-23  NEW LONG TERM GOALS:  TARGET DATE 06-21-23   Pt will improve FGA to at least 24/30 (MCID 4, Fall risk <22) for improved dynamic balance and safety.   Baseline: 6/30 (03/21/23), 12/30 (04/11/23), 20/30 (04/29/23);  score 19/30 - 05-16-23:  score 24/30 on 06-18-23  Goal status: Met 06-18-23  2.   Pt will amb. 115' without device with upright posture, with minimal trunk flexion and minimal trunk lean to Rt side.    Baseline:    Goal status:  Met 06-18-23    3.  Increase gait velocity from 3.23 ft/sec without device to >/= 3.6 ft/sec for increased gait efficiency.    Baseline:  3.23 ft/sec (10.16 secs) ;  10.28, 9.87 secs = 3.32 ft/sec without device           Goal status:  MET  06-18-23    ASSESSMENT:  CLINICAL IMPRESSION:  PT session focused on assessment of LTG's for discharge.  Pt has met 3/3 updated LTG's;  pt's FGA score has increased from 19/30 on 05-16-23 to 24/30 in today's session.  Pt's gait speed has increased from 3.23 ft/sec on 05-16-23  to 3.32 ft/sec without use of device in today's session.  Pt has completed all authorized PT visits at this time; pt has made excellent progress in improving gait and balance.  Plan D/C at this time due to no further needs identified.     OBJECTIVE IMPAIRMENTS: Abnormal gait, decreased activity tolerance, decreased balance, decreased endurance, decreased knowledge of use of DME, decreased mobility, difficulty walking, decreased ROM, decreased strength, decreased safety awareness, dizziness, hypomobility, impaired flexibility, and impaired sensation.   ACTIVITY LIMITATIONS: lifting, bending, squatting, stairs, transfers, bathing, dressing, and hygiene/grooming  PARTICIPATION LIMITATIONS: meal prep, cleaning, laundry, driving, shopping, community activity, occupation, and yard work  PERSONAL FACTORS: Age, Time since onset of injury/illness/exacerbation, and 3+ comorbidities:    hx left cerebellum stroke, TIA (July 2022), transient AMS possibly in setting of hypoglycemia, left-sided headaches, hypertension, hyperlipidemia, diabetes mellitus, h/o cervical fusion C5-7 (2012), h/o neck injury 2016, stage 3 CKD, and intracranial stenosis are also affecting patient's functional outcome.   REHAB POTENTIAL: Fair original stroke was in 2020, potentially had a new stroke, balance and gait have declined since last seen at this clinic  CLINICAL DECISION MAKING: Evolving/moderate complexity  EVALUATION COMPLEXITY: Moderate  PLAN:   PT FREQUENCY: 1x/week  PT DURATION: 4 weeks  PLANNED INTERVENTIONS: 97164- PT Re-evaluation, 97110-Therapeutic exercises, 97530- Therapeutic activity, 97112- Neuromuscular re-education, 97535- Self Care, 02859- Manual therapy, [phone]- Gait training, [phone]- Canalith repositioning, 97014- Electrical stimulation (unattended), [phone]- Electrical stimulation (manual), Patient/Family education, Balance training, Stair training, Taping, Dry Needling, Joint mobilization, Joint manipulation,  Spinal manipulation, Spinal mobilization, Vestibular training, DME instructions, Cryotherapy, and Moist heat  PLAN FOR NEXT SESSION:  D/C on 06-18-23   PHYSICAL THERAPY DISCHARGE SUMMARY  Visits from Start of Care: 15  Current functional level related to goals / functional outcomes: See above for progress towards goals - all goals met   Remaining deficits: Continued decreased high level balance skills and mild gait deviations but pt is able to reduce deviations and improve posture with verbal cues and/or focus & attention to task  Pt continues to have dyspnea with exertion - has appt for echocardiogram scheduled   Education / Equipment: Pt has been instructed in HEP for balance and for RLE strengthening exercises   Patient agrees to discharge. Patient goals were met. Patient is being discharged due to meeting the stated rehab goals.  No further needs identified at this time.    Deisi Salonga, [place], PT [facility] [address], [address] [place], [place] [address] [phone] 06/19/2023, 7:33 PM

## 2023-06-19 ENCOUNTER — Encounter: Payer: Self-pay | Admitting: Physical Therapy

## 2023-06-19 NOTE — Progress Notes (Signed)
   06/18/23 0001  Functional Gait  Assessment  Gait Level Surface 3  Change in Gait Speed 3  Gait with Horizontal Head Turns 3  Gait with Vertical Head Turns 2  Gait and Pivot Turn 3  Step Over Obstacle 3  Gait with Narrow Base of Support 0  Gait with Eyes Closed 2  Ambulating Backwards 3  Steps 2  Total Score 24

## 2023-06-20 ENCOUNTER — Ambulatory Visit: Payer: Medicare PPO | Admitting: Physical Therapy

## 2023-07-19 ENCOUNTER — Emergency Department (HOSPITAL_COMMUNITY)
Admission: EM | Admit: 2023-07-19 | Discharge: 2023-07-19 | Disposition: A | Discharge status: Home / Self Care | Payer: No Typology Code available for payment source | Attending: Emergency Medicine | Admitting: Emergency Medicine

## 2023-07-19 ENCOUNTER — Emergency Department (HOSPITAL_COMMUNITY): Payer: No Typology Code available for payment source

## 2023-07-19 ENCOUNTER — Other Ambulatory Visit: Payer: Self-pay

## 2023-07-19 ENCOUNTER — Encounter (HOSPITAL_COMMUNITY): Payer: Self-pay

## 2023-07-19 DIAGNOSIS — Z7982 Long term (current) use of aspirin: Secondary | ICD-10-CM | POA: Insufficient documentation

## 2023-07-19 DIAGNOSIS — Z79899 Other long term (current) drug therapy: Secondary | ICD-10-CM | POA: Diagnosis not present

## 2023-07-19 DIAGNOSIS — Z7984 Long term (current) use of oral hypoglycemic drugs: Secondary | ICD-10-CM | POA: Diagnosis not present

## 2023-07-19 DIAGNOSIS — I10 Essential (primary) hypertension: Secondary | ICD-10-CM | POA: Insufficient documentation

## 2023-07-19 DIAGNOSIS — E119 Type 2 diabetes mellitus without complications: Secondary | ICD-10-CM | POA: Insufficient documentation

## 2023-07-19 DIAGNOSIS — Z794 Long term (current) use of insulin: Secondary | ICD-10-CM | POA: Diagnosis not present

## 2023-07-19 DIAGNOSIS — R072 Precordial pain: Secondary | ICD-10-CM | POA: Insufficient documentation

## 2023-07-19 DIAGNOSIS — R079 Chest pain, unspecified: Secondary | ICD-10-CM | POA: Diagnosis present

## 2023-07-19 DIAGNOSIS — M7989 Other specified soft tissue disorders: Secondary | ICD-10-CM | POA: Insufficient documentation

## 2023-07-19 DIAGNOSIS — R0602 Shortness of breath: Secondary | ICD-10-CM | POA: Insufficient documentation

## 2023-07-19 LAB — COMPREHENSIVE METABOLIC PANEL
ALT: 33 U/L (ref 0–44)
AST: 25 U/L (ref 15–41)
Albumin: 3.8 g/dL (ref 3.5–5.0)
Alkaline Phosphatase: 57 U/L (ref 38–126)
Anion gap: 12 (ref 5–15)
BUN: 21 mg/dL (ref 8–23)
CO2: 21 mmol/L — ABNORMAL LOW (ref 22–32)
Calcium: 9.5 mg/dL (ref 8.9–10.3)
Chloride: 104 mmol/L (ref 98–111)
Creatinine, Ser: 1.69 mg/dL — ABNORMAL HIGH (ref 0.61–1.24)
GFR, Estimated: 42 mL/min — ABNORMAL LOW (ref >60)
Glucose, Bld: 175 mg/dL — ABNORMAL HIGH (ref 70–99)
Potassium: 3.6 mmol/L (ref 3.5–5.1)
Sodium: 137 mmol/L (ref 135–145)
Total Bilirubin: 0.6 mg/dL (ref 0.0–1.2)
Total Protein: 6.9 g/dL (ref 6.5–8.1)

## 2023-07-19 LAB — CBC WITH DIFFERENTIAL/PLATELET
Abs Immature Granulocytes: 0.02 10*3/uL (ref 0.00–0.07)
Basophils Absolute: 0.1 10*3/uL (ref 0.0–0.1)
Basophils Relative: 1 %
Eosinophils Absolute: 0.2 10*3/uL (ref 0.0–0.5)
Eosinophils Relative: 3 %
HCT: 46.6 % (ref 39.0–52.0)
Hemoglobin: 15.6 g/dL (ref 13.0–17.0)
Immature Granulocytes: 0 %
Lymphocytes Relative: 23 %
Lymphs Abs: 1.8 10*3/uL (ref 0.7–4.0)
MCH: 28.9 pg (ref 26.0–34.0)
MCHC: 33.5 g/dL (ref 30.0–36.0)
MCV: 86.3 fL (ref 80.0–100.0)
Monocytes Absolute: 0.8 10*3/uL (ref 0.1–1.0)
Monocytes Relative: 10 %
Neutro Abs: 4.9 10*3/uL (ref 1.7–7.7)
Neutrophils Relative %: 63 %
Platelets: 223 10*3/uL (ref 150–400)
RBC: 5.4 MIL/uL (ref 4.22–5.81)
RDW: 12.9 % (ref 11.5–15.5)
WBC: 7.7 10*3/uL (ref 4.0–10.5)
nRBC: 0 % (ref 0.0–0.2)

## 2023-07-19 LAB — TROPONIN I (HIGH SENSITIVITY)
Troponin I (High Sensitivity): 5 ng/L (ref <18)
Troponin I (High Sensitivity): 6 ng/L (ref <18)

## 2023-07-19 LAB — CBG MONITORING, ED: Glucose-Capillary: 201 mg/dL — ABNORMAL HIGH (ref 70–99)

## 2023-07-19 LAB — BRAIN NATRIURETIC PEPTIDE: B Natriuretic Peptide: 9.2 pg/mL (ref 0.0–100.0)

## 2023-07-19 MED ORDER — IOHEXOL 350 MG/ML SOLN
75.0000 mL | Freq: Once | INTRAVENOUS | Status: AC | PRN
  Administered 2023-07-19: 75 mL via INTRAVENOUS

## 2023-07-19 NOTE — ED Provider Notes (Addendum)
EMERGENCY DEPARTMENT AT [facility] Provider Note   CSN: [id number] Arrival date & time: 07/19/23  1312     History  Chief Complaint  Patient presents with   Chest Pain    Jeff [name] is a 73 y.o. male.  Patient with a complaint of chest pain substernal radiating to the left side of his neck for 3 days.  Is been constant.  Patient also with exertional shortness of breath has been getting worse.  But has been present for several weeks.  Patient was at the [facility] and was sent in here for additional eval.  Patient had an echocardiogram today done today and had no acute findings.  Patient has no known cardiac disease.  Patient has had some swelling to the legs but they are better because he has been on a diuretic.  Past medical history in for diabetes hypertension hyperlipidemia previous stroke history of seizures.  Patient is never used tobacco products.       Home Medications Prior to Admission medications   Medication Sig Start Date End Date Taking? Authorizing Provider  acetaminophen (TYLENOL) 500 MG tablet Take 500-1,000 mg by mouth every 6 (six) hours as needed for headache.    [provider]  aspirin EC 81 MG EC tablet Take 1 tablet (81 mg total) by mouth daily. Swallow whole. 01/04/21   Joseph Art, DO  Cholecalciferol (VITAMIN D) 50 MCG (2000 UT) tablet Take 2,000 Units by mouth daily.    [provider]  empagliflozin (JARDIANCE) 25 MG TABS tablet Take 12.5 mg by mouth daily. 02/09/21   [provider]  Glucagon 1 MG/0.2ML SOAJ Inject 1 mg into the skin as needed (low blood sugar). 11/22/22   Rhetta Mura, MD  glucose blood (ONETOUCH VERIO) test strip 1 each by Other route 4 (four) times daily -  before meals and at bedtime. dexcom 6 01/03/21   Joseph Art, DO  insulin aspart (NOVOLOG FLEXPEN) 100 UNIT/ML FlexPen Inject 0-20 Units into the skin See admin instructions. Sliding scale per endocrinologist. (3 times  with each meal) Less than 100 = 0  100 -150 = 5 units  150-200 = 8 units   200-250 =12 units 11/22/22   Rhetta Mura, MD  insulin glargine (LANTUS) 100 UNIT/ML injection Inject 0.1 mLs (10 Units total) into the skin 2 (two) times daily. Patient taking differently: Inject 20 Units into the skin 2 (two) times daily. 11/22/22   Rhetta Mura, MD  Insulin Syringe-Needle U-100 (B-D INS SYR ULTRAFINE 1CC/31G) 31G X 5/16" 1 ML MISC Used to inject insulin twice daily. 08/16/17   Romero Belling, MD  levothyroxine (SYNTHROID) 50 MCG tablet Take 50 mcg by mouth daily before breakfast.    [provider]  lisinopril (ZESTRIL) 2.5 MG tablet Take 1 tablet (2.5 mg total) by mouth daily. Patient not taking: Reported on 03/04/2023 01/09/21   Joseph Art, DO  Magnesium 250 MG TABS Take 250 mg by mouth 2 (two) times daily.    [provider]  Multiple Vitamins-Minerals (ICAPS AREDS 2 PO) Take 1 capsule by mouth in the morning and at bedtime.    [provider]  pantoprazole (PROTONIX) 40 MG tablet Take 1 tablet (40 mg total) by mouth daily. 01/03/21   Joseph Art, DO  rosuvastatin (CRESTOR) 40 MG tablet TAKE 1 TABLET BY MOUTH DAILY Patient taking differently: Take 40 mg by mouth every evening. 08/28/18   Noni Saupe, MD  sodium bicarbonate 650 MG tablet Take 650 mg by mouth 2 (two) times daily. 02/12/23   [provider]  tamsulosin (FLOMAX) 0.4 MG CAPS capsule Take 1 capsule (0.4 mg total) by mouth daily. 11/23/22   Rhetta Mura, MD  topiramate (TOPAMAX) 100 MG tablet Take 1 tablet (100 mg total) by mouth 2 (two) times daily. 03/04/23   Ihor Austin, NP      Allergies    Flexeril [cyclobenzaprine hcl], Lidocaine, and Novocain [procaine hcl]    Review of Systems   Review of Systems  Constitutional:  Negative for chills and fever.  HENT:  Negative for ear pain and sore throat.   Eyes:  Negative for pain and visual disturbance.  Respiratory:   Positive for shortness of breath. Negative for cough.   Cardiovascular:  Positive for chest pain. Negative for palpitations.  Gastrointestinal:  Negative for abdominal pain and vomiting.  Genitourinary:  Negative for dysuria and hematuria.  Musculoskeletal:  Negative for arthralgias and back pain.  Skin:  Negative for color change and rash.  Neurological:  Negative for seizures and syncope.  All other systems reviewed and are negative.   Physical Exam Updated Vital Signs BP (!) 129/59   Pulse 64   Temp 98.3 F (36.8 C) (Oral)   Resp 11   Ht 1.727 m (5\' 8" )   Wt 85.7 kg   SpO2 100%   BMI 28.74 kg/m  Physical Exam Vitals and nursing note reviewed.  Constitutional:      General: He is not in acute distress.    Appearance: Normal appearance. He is well-developed. He is not ill-appearing.  HENT:     Head: Normocephalic and atraumatic.  Eyes:     Extraocular Movements: Extraocular movements intact.     Conjunctiva/sclera: Conjunctivae normal.     Pupils: Pupils are equal, round, and reactive to light.  Cardiovascular:     Rate and Rhythm: Normal rate and regular rhythm.     Heart sounds: No murmur heard. Pulmonary:     Effort: Pulmonary effort is normal. No respiratory distress.     Breath sounds: Normal breath sounds. No wheezing or rales.  Abdominal:     Palpations: Abdomen is soft.     Tenderness: There is no abdominal tenderness.  Musculoskeletal:        General: No swelling.     Cervical back: Normal range of motion and neck supple.     Right lower leg: No edema.     Left lower leg: No edema.  Skin:    General: Skin is warm and dry.     Capillary Refill: Capillary refill takes less than 2 seconds.  Neurological:     General: No focal deficit present.     Mental Status: He is alert and oriented to person, place, and time.  Psychiatric:        Mood and Affect: Mood normal.     ED Results / Procedures / Treatments   Labs (all labs ordered are listed, but only  abnormal results are displayed) Labs Reviewed  COMPREHENSIVE METABOLIC PANEL - Abnormal; Notable for the following components:      Result Value   CO2 21 (*)    Glucose, Bld 175 (*)    Creatinine, Ser 1.69 (*)    GFR, Estimated 42 (*)    All other components within normal limits  CBG MONITORING, ED - Abnormal; Notable for the following components:   Glucose-Capillary 201 (*)    All other components within normal  limits  BRAIN NATRIURETIC PEPTIDE  CBC WITH DIFFERENTIAL/PLATELET  TROPONIN I (HIGH SENSITIVITY)    EKG EKG Interpretation Date/Time:  Friday July 19 2023 13:36:13 EST Ventricular Rate:  67 PR Interval:  162 QRS Duration:  90 QT Interval:  402 QTC Calculation: 424 R Axis:   14  Text Interpretation: Normal sinus rhythm Cannot rule out Anterior infarct , age undetermined Abnormal ECG When compared with ECG of 20-Nov-2022 11:51, PREVIOUS ECG IS PRESENT No significant change since last tracing Confirmed by Vanetta Mulders [phone]) on 07/19/2023 7:03:52 PM  Radiology DG Chest 1 View Result Date: 07/19/2023 CLINICAL DATA:  Shortness of breath. EXAM: CHEST  1 VIEW COMPARISON:  Chest radiograph dated 01/01/2021. FINDINGS: The heart size and mediastinal contours are within normal limits. No focal consolidation, sizeable pleural effusion, or pneumothorax. Unchanged appearance of anterior cervical fusion hardware with similar loosening of the right-sided C7 screw. No acute osseous abnormality. IMPRESSION: No acute cardiopulmonary findings. Electronically Signed   By: Hart Robinsons M.D.   On: 07/19/2023 16:40    Procedures Procedures    Medications Ordered in ED Medications - No data to display  ED Course/ Medical Decision Making/ A&P                                 Medical Decision Making Amount and/or Complexity of Data Reviewed Radiology: ordered.  Risk Prescription drug management.   Unfortunately patient never had troponins done.  We contacted the lab to  have some original blood.  They will run troponin on that.  Patient also will need a delta troponin but we can now run that now.  And will get probably both of them back around the same time.  Spent enough time that it has already been 2 hours.  Patient's lab workup here BNP 9.2 very reassuring complete metabolic panel normal GFR 42 which is baseline for him.  CBC with differential is normal 1 view chest had no acute findings.  EKG without any significant changes.  Patient will need to delta troponin along with the initial troponin and will go ahead and do CT angio chest to evaluate the lungs better and rule out pulmonary embolus.  Patient's troponins first 1 was 5 repeat was 6.  No significant change.  CT angio chest no pulmonary embolus no acute intrathoracic abnormality.  Nothing to explain patient's exertional shortness of breath.  May need to follow-up with pulmonary medicine.  Patient stable for discharge home.  No evidence of any acute cardiac event would recommend following up with cardiology as well as pulmonary medicine.   Final Clinical Impression(s) / ED Diagnoses Final diagnoses:  Precordial pain  Shortness of breath    Rx / DC Orders ED Discharge Orders     None         Vanetta Mulders, MD 07/19/23 Adelene Idler, MD 07/19/23 2256

## 2023-07-19 NOTE — ED Notes (Signed)
Thept has been in the ed since 1100  he has had lt sided neck pain for 3 days  [facility] [place]  they told him to come here to be seen  when he was triaged there was no tro order   the pt admits to sob for 2 months especially when he walks

## 2023-07-19 NOTE — ED Triage Notes (Signed)
Pt c/o chest pain radiating into neck with shortness of breath. Pt denies nausea or vomiting. Pt went to [place] and was sent for eval.

## 2023-07-19 NOTE — ED Notes (Signed)
Tro added to blood already in lab

## 2023-07-19 NOTE — Discharge Instructions (Signed)
Workup here today without any acute findings related to the chest pain but recommend you follow-up with cardiology.  Call and set up an appointment.  Also do not have a good explanation for the exertional shortness of breath.  No evidence of any blood clots or abnormalities in the lungs.  But recommend that she get pulmonary function test and this could be done by [facility] pulmonary medicine.  Make an appointment to follow-up with them.  Return for any new or worse symptoms.

## 2023-07-19 NOTE — ED Provider Triage Note (Signed)
Emergency Medicine Provider Triage Evaluation Note  Jeff [name] , a 73 y.o. male  was evaluated in triage.  Pt complains of dyspnea on exertion.  Patient reports worsening dyspnea on exertion for the past 2 weeks.  His primary care doctor at the [place] put him on a diuretic that has improved the swelling in his legs but still has worsening shortness of breath with movement.  He states that his doctors offices on the second floor he had to stop multiple times prior to getting to the office due to being short of breath.  Additionally, he reports intermittent pain in the neck.  Pain is not worse to touch.  Review of Systems  Positive: Shortness of breath, left-sided neck pain Negative: Chest pain  Physical Exam  BP 127/77 (BP Location: Right Arm)   Pulse 72   Temp 98 F (36.7 C) (Oral)   Resp 17   Ht 5\' 8"  (1.727 m)   Wt 85.7 kg   SpO2 94%   BMI 28.74 kg/m  Gen:   Awake, no distress   Resp:  Normal effort  MSK:   Moves extremities without difficulty  Other: No carotid bruits   Medical Decision Making  Medically screening exam initiated at 2:12 PM.  Appropriate orders placed.  PATTY LOPEZGARCIA was informed that the remainder of the evaluation will be completed by another provider, this initial triage assessment does not replace that evaluation, and the importance of remaining in the ED until their evaluation is complete.   Maxwell Marion, PA-C 07/19/23 1413

## 2023-07-24 ENCOUNTER — Encounter: Payer: Self-pay | Admitting: Internal Medicine

## 2023-07-24 ENCOUNTER — Ambulatory Visit: Payer: Medicare PPO | Admitting: Internal Medicine

## 2023-07-24 VITALS — BP 104/70 | HR 72 | Ht 68.0 in | Wt 188.6 lb

## 2023-07-24 DIAGNOSIS — R0609 Other forms of dyspnea: Secondary | ICD-10-CM

## 2023-07-24 NOTE — Progress Notes (Signed)
Jeff [name]    [id number]    1951/04/02  Primary Care Physician:System, Provider Not In  Referring Physician: No referring provider defined for this encounter. Reason for Consultation: shortness of breath Date of Consultation: 07/24/2023  Chief complaint:   Chief Complaint  Patient presents with   Hospitalization Follow-up    Pt complains of sob      HPI: Jeff [name] is a 73 y.o. with past medical history of left CVA(with residual right sided weakness and balance), CKD stage 3, DM2, who presents for new patient evaluation of shortness of breath. He was seeing his PCP at the [place] who saw him last week and was concerned he was having ischemic symptoms. Was seen in the ED last week and had CTPE study negative for PE, showing three vessel calcifications as well as aortic valve calcifications. Troponin was negative. Referred here for further follow up. A cardiology referral is also pending.   He is now having dyspnea with exertion for the last 2 months. Now it affects him with ADLs including walking around the house, from the lobby to the exam room. He also has to sit down now to take a shower. Denies coughing or wheezing. Just feels like he is gasping for breath. Always with exertion.   He denies dyspnea which wakes him up from sleep. He does have OSA and doesn't wear it. (Through the [place].)  He was a football official for 31 years, did a lot running around so this level of dyspnea is difficult for him.   Social history:  Occupation: [place] war veteran, retired from the [employer]. 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  Exposures: lives at home with his wife Smoking history: never smoker, doesn't drink.    Social History   Occupational History   Occupation: Archivist  Tobacco Use   Smoking status: Never   Smokeless tobacco: Never  Vaping Use   Vaping status: Never Used  Substance and Sexual Activity   Alcohol use: No    Alcohol/week: 0.0 standard  drinks of alcohol   Drug use: No   Sexual activity: Yes    Relevant family history:  Family History  Problem Relation Age of Onset   Heart disease Mother        CHF   Diabetes Mother    Heart disease Father    Cancer Brother     Past Medical History:  Diagnosis Date   ADHD (attention deficit hyperactivity disorder)    CVA (cerebral vascular accident) (HCC) 2022   Diabetes mellitus    Hyperlipidemia    Hypertension    Renal disorder    Seizures (HCC)    Stroke [facility])     Past Surgical History:  Procedure Laterality Date   IR ANGIO INTRA EXTRACRAN SEL COM CAROTID INNOMINATE BILAT MOD SED  01/03/2021   IR ANGIO VERTEBRAL SEL SUBCLAVIAN INNOMINATE UNI L MOD SED  01/03/2021   IR ANGIO VERTEBRAL SEL VERTEBRAL UNI R MOD SED  01/03/2021   IR RADIOLOGIST EVAL & MGMT  01/13/2021   IR US GUIDE VASC ACCESS RIGHT  01/03/2021   NECK SURGERY     SPINE SURGERY     Cervical spine x 2; Vear Clock; Critzer.     Physical Exam: Blood pressure 104/70, pulse 72, height 5\' 8"  (1.727 m), weight 188 lb 9.6 oz (85.5 kg), SpO2 96%. Gen:      No acute distress, HOH ENT:  no nasal polyps,  mucus membranes moist Lungs:    No increased respiratory effort, symmetric chest wall excursion, clear to auscultation bilaterally, no wheezes or crackles CV:         Regular rate and rhythm; no murmurs, rubs, or gallops.  No pedal edema Abd:      + bowel sounds; soft, non-tender; no distension MSK: no acute synovitis of DIP or PIP joints, no mechanics hands.  Skin:      Warm and dry; no rashes Neuro: normal speech, no focal facial asymmetry Psych: alert and oriented x3, normal mood and affect   Data Reviewed/Medical Decision Making:  Independent interpretation of tests: Imaging:  Review of patient's CTPE images Feb 2025 revealed no PE, no pulmonary findings, coronary and aortic valve calcifications. The patient's images have been independently reviewed by me.    PFTs:  Echocardiogram August 2022 1. Left  ventricular ejection fraction, by estimation, is 60 to 65%. The  left ventricle has normal function. The left ventricle has no regional  wall motion abnormalities. Left ventricular diastolic parameters were  normal.   2. Right ventricular systolic function is normal. The right ventricular  size is normal. Tricuspid regurgitation signal is inadequate for assessing  PA pressure.   3. The mitral valve is normal in structure. No evidence of mitral valve  regurgitation.   4. The aortic valve was not well visualized. Aortic valve regurgitation  is trivial. Mild to moderate aortic valve sclerosis/calcification is  present, without any evidence of aortic stenosis.   5. The inferior vena cava is normal in size with greater than 50%  respiratory variability, suggesting right atrial pressure of 3 mmHg.   Labs:  Lab Results  Component Value Date   NA 137 07/19/2023   K 3.6 07/19/2023   CO2 21 (L) 07/19/2023   GLUCOSE 175 (H) 07/19/2023   BUN 21 07/19/2023   CREATININE 1.69 (H) 07/19/2023   CALCIUM 9.5 07/19/2023   GFRNONAA 42 (L) 07/19/2023   Lab Results  Component Value Date   WBC 7.7 07/19/2023   HGB 15.6 07/19/2023   HCT 46.6 07/19/2023   MCV 86.3 07/19/2023   PLT 223 07/19/2023     Immunization status:  Immunization History  Administered Date(s) Administered   Fluad Quad(high Dose 65+) 03/18/2019, 03/11/2020   Influenza, Seasonal, Injecte, Preservative Fre 03/09/2013   Influenza,inj,Quad PF,6+ Mos 05/31/2016, 02/25/2017   Influenza-Unspecified 06/30/2018   Pneumococcal Conjugate-13 10/02/2014   Pneumococcal Polysaccharide-23 05/31/2016   Tdap 03/09/2013   Zoster, Live 06/04/2014      I reviewed prior external note(s) from ED visit  I reviewed the result(s) of the labs and imaging as noted above.   I have ordered pft   Assessment:  Shortness of breath Coronary calcifications  Plan/Recommendations: It was a pleasure to see you today!  I think your lungs are likely  not the cause for your shortness of breath. I am ordering breathing testing to be sure.   I highly recommend your follow up next week with Dr. Jacinto Halim the cardiologist - I think you might need a stress test, a heart catheterization, or additional medications.    We discussed disease management and progression at length today.   Return to Care: Return if symptoms worsen or fail to improve.  Durel Salts, MD Pulmonary and Critical Care Medicine [facility] [phone]  CC: No ref. provider found

## 2023-07-24 NOTE — Patient Instructions (Signed)
It was a pleasure to see you today! I think your lungs are likely not the cause for your shortness of breath. I am ordering breathing testing to be sure.  I highly recommend your follow up next week with Dr. Jacinto Halim the cardiologist - I think you might need a stress test, a heart catheterization, or additional medications.    Please call [phone] f issues or concerns arise. You can also send [place] a message through MyChart, but but aware that this is not to be used for urgent issues and it may take up to 5-7 days to receive a reply. Please be aware that you will likely be able to view your results before I have a chance to respond to them. Please give [place] 5 business days to respond to any non-urgent results.

## 2023-07-29 ENCOUNTER — Encounter: Payer: Self-pay | Admitting: Cardiology

## 2023-07-29 ENCOUNTER — Encounter: Payer: Self-pay | Admitting: *Deleted

## 2023-07-29 ENCOUNTER — Ambulatory Visit: Payer: Medicare PPO | Attending: Cardiology | Admitting: Cardiology

## 2023-07-29 VITALS — BP 104/68 | HR 73 | Resp 16 | Ht 68.0 in | Wt 183.0 lb

## 2023-07-29 DIAGNOSIS — I10 Essential (primary) hypertension: Secondary | ICD-10-CM | POA: Diagnosis not present

## 2023-07-29 DIAGNOSIS — R0609 Other forms of dyspnea: Secondary | ICD-10-CM | POA: Diagnosis not present

## 2023-07-29 DIAGNOSIS — E1122 Type 2 diabetes mellitus with diabetic chronic kidney disease: Secondary | ICD-10-CM

## 2023-07-29 DIAGNOSIS — R600 Localized edema: Secondary | ICD-10-CM | POA: Diagnosis not present

## 2023-07-29 DIAGNOSIS — N1831 Chronic kidney disease, stage 3a: Secondary | ICD-10-CM

## 2023-07-29 DIAGNOSIS — I2089 Other forms of angina pectoris: Secondary | ICD-10-CM | POA: Diagnosis not present

## 2023-07-29 MED ORDER — AMLODIPINE BESYLATE 2.5 MG PO TABS: 2.5000 mg | ORAL_TABLET | Freq: Every day | ORAL | 2 refills | Status: DC

## 2023-07-29 MED ORDER — NITROGLYCERIN 0.4 MG SL SUBL: 0.4000 mg | SUBLINGUAL_TABLET | SUBLINGUAL | 3 refills | Status: AC | PRN

## 2023-07-29 MED ORDER — FUROSEMIDE 20 MG PO TABS: 10.0000 mg | ORAL_TABLET | Freq: Every day | ORAL | 1 refills | Status: DC | PRN

## 2023-07-29 NOTE — Patient Instructions (Addendum)
 Medication Instructions:  Your physician has recommended you make the following change in your medication: Start amlodipine 2.5 mg by mouth daily A prescription for nitroglycerin has been sent to your pharmacy to use as needed.   *If you need a refill on your cardiac medications before your next appointment, please call your pharmacy*   Lab Work: none If you have labs (blood work) drawn today and your tests are completely normal, you will receive your results only by: MyChart Message (if you have MyChart) OR A paper copy in the mail If you have any lab test that is abnormal or we need to change your treatment, we will call you to review the results.   Testing/Procedures: Your physician has requested that you have an exercise stress myoview. For further information please visit [email or web address] Please follow instruction sheet, as given.    Follow-Up: At [facility], you and your health needs are our priority.  As part of our continuing mission to provide you with exceptional heart care, we have created designated Provider Care Teams.  These Care Teams include your primary Cardiologist (physician) and Advanced Practice Providers (APPs -  Physician Assistants and Nurse Practitioners) who all work together to provide you with the care you need, when you need it.  We recommend signing up for the patient portal called "MyChart".  Sign up information is provided on this After Visit Summary.  MyChart is used to connect with patients for Virtual Visits (Telemedicine).  Patients are able to view lab/test results, encounter notes, upcoming appointments, etc.  Non-urgent messages can be sent to your provider as well.   To learn more about what you can do with MyChart, go to [email or web address].    Your next appointment:   3/25 at 2:20  Provider:   Yates Decamp, MD     Other Instructions

## 2023-07-29 NOTE — Progress Notes (Signed)
 Cardiology Office Note:  .   Date:  07/29/2023  ID:  Jeff [name], DOB 10/28/1950, MRN [id number] PCP: Anson Fret, MD  [facility] Providers Cardiologist:  Yates Decamp, MD   History of Present Illness: .   Jeff [name] is a 73 y.o. 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    He was again seen in the emergency room on 07/19/2023 with substernal chest pain and dyspnea on exertion and hence referred for further cardiac risk evaluation.  Symptoms of exertional left-sided neck pain/chest pain and also shortness of breath started about 2 months ago and has been gradually worsening.  Discussed the use of AI scribe software for clinical note transcription with the patient, who gave verbal consent to proceed.  History of Present Illness   The patient, with a history of high blood pressure, diabetes mellitus, mild kidney problems, high cholesterol, and a left vertebral artery embolic stroke, presents with chest discomfort, shortness of breath, and neck pain. The symptoms have been ongoing for a couple of months, gradually worsening over time. The patient reports experiencing shortness of breath during physical activities, such as taking a shower or walking from one room to another. The patient also experiences neck pain during these episodes of shortness of breath. The patient's activity level has been decreasing due to these symptoms. The patient was previously on Lisinopril for high blood pressure, but it was discontinued in the hospital due to a decline in kidney function. The patient is currently on a  water pill, Furosemide, which is taken as needed.     Labs   Lab Results  Component Value Date   NA 137 07/19/2023   K 3.6 07/19/2023   CO2 21 (L) 07/19/2023   GLUCOSE 175 (H) 07/19/2023   BUN 21 07/19/2023   CREATININE 1.69 (H) 07/19/2023   CALCIUM 9.5 07/19/2023   GFRNONAA 42 (L) 07/19/2023      Latest Ref Rng & Units 07/19/2023    2:26 PM 11/22/2022    3:57 AM 11/21/2022    2:15 AM  BMP  Glucose 70 - 99 mg/dL 469  629  528   BUN 8 - 23 mg/dL 21  24  24    Creatinine 0.61 - 1.24 mg/dL 4.13  2.44  0.10   Sodium 135 - 145 mmol/L 137  139  137   Potassium 3.5 - 5.1 mmol/L 3.6  3.8  3.5   Chloride 98 - 111 mmol/L 104  106  108   CO2 22 - 32 mmol/L 21  22  21    Calcium 8.9 - 10.3 mg/dL 9.5  9.2  8.8       Latest Ref Rng & Units 07/19/2023    2:26 PM 11/20/2022    2:38 PM 11/20/2022   11:47 AM  CBC  WBC 4.0 - 10.5 K/uL 7.7   6.8   Hemoglobin 13.0 - 17.0 g/dL 27.2  53.6  64.4   Hematocrit 39.0 - 52.0 % 46.6  42.0  44.7   Platelets 150 - 400 K/uL 223   199    Lab Results  Component Value Date   HGBA1C 6.7 (  H) 11/20/2022    Lab Results  Component Value Date   TSH 1.915 02/27/2014    External Labs:  Care everywhere labs 05/17/2023:  Total cholesterol 06[birth year]04/1928, triglycerides 135, HDL 40.8, LDL 69.  BNP 11  Review of Systems  Cardiovascular:  Positive for chest pain (left neck pain on exertion) and dyspnea on exertion. Negative for leg swelling.   Physical Exam:   VS:  BP 104/68 (BP Location: Left Arm, Patient Position: Sitting, Cuff Size: Normal)   Pulse 73   Resp 16   Ht 5\' 8"  (1.727 m)   Wt 183 lb 0.3 oz (83 kg)   SpO2 97%   BMI 27.83 kg/m    Wt Readings from Last 3 Encounters:  07/29/23 183 lb 0.3 oz (83 kg)  07/24/23 188 lb 9.6 oz (85.5 kg)  07/19/23 189 lb (85.7 kg)    Physical Exam Neck:     Vascular: No carotid bruit or JVD.  Cardiovascular:     Rate and Rhythm: Normal rate and regular rhythm.     Pulses: Intact distal pulses.     Heart sounds:  Normal heart sounds. No murmur heard.    No gallop.  Pulmonary:     Effort: Pulmonary effort is normal.     Breath sounds: Normal breath sounds.  Abdominal:     General: Bowel sounds are normal.     Palpations: Abdomen is soft.  Musculoskeletal:     Right lower leg: No edema.     Left lower leg: No edema.    Studies Reviewed: .    Carotid artery duplex 09/12/2022: Bilateral ICA 1-39% stenosis with heterogenous plaque. Antegrade vertebral artery flow with high resistance on the left.  ECHOCARDIOGRAM COMPLETE 01/02/2021  1. Left ventricular ejection fraction, by estimation, is 60 to 65%. The left ventricle has normal function. The left ventricle has no regional wall motion abnormalities. Left ventricular diastolic parameters were normal. 2. Right ventricular systolic function is normal. The right ventricular size is normal. Tricuspid regurgitation signal is inadequate for assessing PA pressure. 3. The mitral valve is normal in structure. No evidence of mitral valve regurgitation. 4. The aortic valve was not well visualized. Aortic valve regurgitation is trivial. Mild to moderate aortic valve sclerosis/calcification is present, without any evidence of aortic stenosis.  CXR 06/26/2023: Cardiomediastinal silhouette is unremarkable. The pulmonary  vasculature is unremarkable. No focal consolidation, pleural  effusion, or pneumothorax is seen. No acute osseous abnormality  is evident. There is ACDF hardware.    EKG:    EKG Interpretation Date/Time:  Monday July 29 2023 10:58:58 EST Ventricular Rate:  73 PR Interval:  160 QRS Duration:  86 QT Interval:  372 QTC Calculation: 409 R Axis:   4  Text Interpretation: EKG 07/29/2023: Normal sinus rhythm at rate of 73 bpm, normal axis, no evidence of ischemia, normal EKG.  Compared to 07/19/2023, no significant change. Poor R wave progression not present. Confirmed by Delrae Rend [phone]) on 07/29/2023 11:04:35 AM    Medications and  allergies    Allergies  Allergen Reactions   Flexeril [Cyclobenzaprine Hcl] Other (See Comments)    Causes him to pass out and lowers his BP   Lidocaine Swelling    throat   Novocain [Procaine Hcl] Swelling     Current Outpatient Medications:    acetaminophen (TYLENOL) 500 MG tablet, Take 500-1,000 mg by mouth every 6 (six) hours as needed for headache., Disp: , Rfl:    amLODipine (NORVASC) 2.5 MG tablet, Take 1 tablet (  2.5 mg total) by mouth daily., Disp: 30 tablet, Rfl: 2   aspirin EC 81 MG EC tablet, Take 1 tablet (81 mg total) by mouth daily. Swallow whole., Disp: 30 tablet, Rfl: 11   Cholecalciferol (VITAMIN D) 50 MCG (2000 UT) tablet, Take 2,000 Units by mouth daily., Disp: , Rfl:    cycloSPORINE (RESTASIS) 0.05 % ophthalmic emulsion, Place 1 drop into both eyes 2 (two) times daily., Disp: , Rfl:    empagliflozin (JARDIANCE) 10 MG TABS tablet, Take 10 mg by mouth daily., Disp: , Rfl:    Glucagon 1 MG/0.2ML SOAJ, Inject 1 mg into the skin as needed (low blood sugar)., Disp: 0.2 mL, Rfl: 0   glucose blood (ONETOUCH VERIO) test strip, 1 each by Other route 4 (four) times daily -  before meals and at bedtime. dexcom 6, Disp: , Rfl:    insulin aspart (NOVOLOG FLEXPEN) 100 UNIT/ML FlexPen, Inject 0-20 Units into the skin See admin instructions. Sliding scale per endocrinologist. (3 times with each meal) Less than 100 = 0  100 -150 = 5 units  150-200 = 8 units   200-250 =12 units, Disp: 15 mL, Rfl: 11   insulin glargine (LANTUS) 100 UNIT/ML injection, Inject 0.1 mLs (10 Units total) into the skin 2 (two) times daily. (Patient taking differently: Inject 25-30 Units into the skin See admin instructions. Take 30 units sq in the morning and then take 25 units at night), Disp: , Rfl:    Insulin Syringe-Needle U-100 (B-D INS SYR ULTRAFINE 1CC/31G) 31G X 5/16" 1 ML MISC, Used to inject insulin twice daily., Disp: 90 each, Rfl: 2   levothyroxine (SYNTHROID) 50 MCG tablet, Take 50 mcg by mouth daily  before breakfast., Disp: , Rfl:    Magnesium Oxide 420 MG TABS, Take 1 tablet by mouth 2 (two) times daily., Disp: , Rfl:    Multiple Vitamins-Minerals (ICAPS AREDS 2 PO), Take 1 capsule by mouth in the morning and at bedtime., Disp: , Rfl:    nitroGLYCERIN (NITROSTAT) 0.4 MG SL tablet, Place 1 tablet (0.4 mg total) under the tongue every 5 (five) minutes as needed for chest pain., Disp: 25 tablet, Rfl: 3   pantoprazole (PROTONIX) 40 MG tablet, Take 1 tablet (40 mg total) by mouth daily., Disp: , Rfl:    rosuvastatin (CRESTOR) 40 MG tablet, TAKE 1 TABLET BY MOUTH DAILY (Patient taking differently: Take 40 mg by mouth every evening.), Disp: 90 tablet, Rfl: 0   sodium bicarbonate 650 MG tablet, Take 650 mg by mouth 2 (two) times daily., Disp: , Rfl:    tamsulosin (FLOMAX) 0.4 MG CAPS capsule, Take 1 capsule (0.4 mg total) by mouth daily., Disp: 30 capsule, Rfl: 0   topiramate (TOPAMAX) 100 MG tablet, Take 1 tablet (100 mg total) by mouth 2 (two) times daily., Disp: 60 tablet, Rfl: 5   furosemide (LASIX) 20 MG tablet, Take 0.5 tablets (10 mg total) by mouth daily as needed for edema (Leg swelling)., Disp: 30 tablet, Rfl: 1   Semaglutide, 1 MG/DOSE, (OZEMPIC, 1 MG/DOSE,) 2 MG/1.5ML SOPN, Inject 2 mg into the skin once a week. (Patient not taking: Reported on 07/29/2023), Disp: , Rfl:    ASSESSMENT AND PLAN: .      ICD-10-CM   1. Stable angina pectoris (HCC)  I20.89 EKG 12-Lead    amLODipine (NORVASC) 2.5 MG tablet    Cardiac Stress Test: Informed Consent Details: Physician/Practitioner Attestation; Transcribe to consent form and obtain patient signature    2. Dyspnea on exertion  R06.09 MYOCARDIAL PERFUSION IMAGING    3. Type 2 diabetes mellitus with stage 3a chronic kidney disease, without long-term current use of insulin (HCC)  E11.22    N18.31     4. Primary hypertension  I10     5. Leg edema  R60.0 furosemide (LASIX) 20 MG tablet     Assessment and Plan    Dyspnea and Chest  Pain Progressive dyspnea and chest pain over the past two months worsen with exertion, accompanied by shortness of breath and neck pain during activities. A cardiac etiology, possibly angina pectoris, is considered. Echocardiogram results from the [place] are pending. A stress test is recommended to evaluate cardiac function, considering risks like arrhythmias and myocardial infarction, and benefits such as accurate diagnosis and management. Order a treadmill exercise nuclear stress test, request echocardiogram results upload to MyChart, start amlodipine 2.5 mg daily, and refill furosemide.  In view of underlying stage IIIb chronic kidney disease, best option is to restart if I am forced by nuclear stress test and depending upon this we will consider catheterization.  S/L NTG was prescribed and explained how to and when to use it and to notify us if there is change in frequency of use.   Hypertension Blood pressure is well-controlled at 104/68 mmHg. Lisinopril was discontinued during hospitalization in June 2024 due to decreased kidney function.  He is presently on Lasix 10 mg daily, he has had complete resolution of leg edema.  His blood pressure is very soft, although the symptoms are suggestive of angina pectoris, extremely hard to uptitrate any medications, I started him on 2.5 mg of amlodipine.  Although he has diagnosis of hypertension, suspect the combination of low-dose furosemide, Jardiance may be contributing to his controlled blood pressure.  Change low-dose furosemide to as needed use for leg edema.  Emphasize regular monitoring.   Diabetes Mellitus Type 2 diabetes is managed with Jardiance, previously on Ozempic (now on hold for 2 weeks). Jardiance benefits heart and kidney protection and blood glucose management. Continue Jardiance.  Ozempic was discontinued during hospitalization in June and he had restarted this back however he has not taken it for the past 2 weeks.  Management is as per  primary care.  There was thoughts about his TIA-like presentation may have been related to severe hypoglycemia.  Hyperlipidemia Cholesterol is well-controlled with LDL at 69 mg/dL. Participation in a cholesterol study involves injections every six months. Maintaining LDL below 70 mg/dL is crucial to reduce cardiovascular risk. Continue the current cholesterol management regimen.  Chronic Kidney Disease Stage 3b Mild kidney impairment due to diabetes is well-managed. Regular monitoring is necessary to detect early changes. Monitor kidney function.  Follow-up Schedule a follow-up appointment in 3-4 weeks.  Patient's wife is present and all questions answered.   Signed,  Yates Decamp, MD, [facility] 07/29/2023, 5:37 PM [facility] [address] [place], [place] [address] Phone: [phone]. Fax:  [phone]

## 2023-08-02 ENCOUNTER — Telehealth (HOSPITAL_COMMUNITY): Payer: Self-pay | Admitting: *Deleted

## 2023-08-02 NOTE — Telephone Encounter (Signed)
 Patient given detailed instructions per Myocardial Perfusion Study Information Sheet for the test on 08/09/2023 at 10:30. Patient notified to arrive 15 minutes early and that it is imperative to arrive on time for appointment to keep from having the test rescheduled.  If you need to cancel or reschedule your appointment, please call the office within 24 hours of your appointment. . Patient verbalized understanding.Daneil Dolin

## 2023-08-04 ENCOUNTER — Encounter: Payer: Self-pay | Admitting: Cardiology

## 2023-08-09 ENCOUNTER — Ambulatory Visit (HOSPITAL_COMMUNITY): Payer: Medicare PPO | Attending: Cardiology

## 2023-08-09 DIAGNOSIS — R0609 Other forms of dyspnea: Secondary | ICD-10-CM | POA: Insufficient documentation

## 2023-08-09 LAB — MYOCARDIAL PERFUSION IMAGING
LV dias vol: 63 mL (ref 62–150)
LV sys vol: 23 mL
Nuc Stress EF: 63 %
Peak HR: 118 {beats}/min
Rest HR: 72 {beats}/min
Rest Nuclear Isotope Dose: 10.4 mCi
SDS: 0
SRS: 0
SSS: 0
ST Depression (mm): 0 mm
Stress Nuclear Isotope Dose: 32 mCi
TID: 1.02

## 2023-08-09 MED ORDER — TECHNETIUM TC 99M TETROFOSMIN IV KIT
10.4000 | PACK | Freq: Once | INTRAVENOUS | Status: AC | PRN
  Administered 2023-08-09: 10.4 via INTRAVENOUS

## 2023-08-09 MED ORDER — AMINOPHYLLINE 25 MG/ML IV SOLN
150.0000 mg | Freq: Once | INTRAVENOUS | Status: AC
  Administered 2023-08-09: 150 mg via INTRAVENOUS

## 2023-08-09 MED ORDER — REGADENOSON 0.4 MG/5ML IV SOLN
0.4000 mg | Freq: Once | INTRAVENOUS | Status: AC
  Administered 2023-08-09: 0.4 mg via INTRAVENOUS

## 2023-08-09 MED ORDER — TECHNETIUM TC 99M TETROFOSMIN IV KIT
32.0000 | PACK | Freq: Once | INTRAVENOUS | Status: AC | PRN
  Administered 2023-08-09: 32 via INTRAVENOUS

## 2023-08-11 ENCOUNTER — Encounter: Payer: Self-pay | Admitting: Cardiology

## 2023-08-11 NOTE — Progress Notes (Signed)
 Normal stress Test

## 2023-08-14 ENCOUNTER — Ambulatory Visit (HOSPITAL_BASED_OUTPATIENT_CLINIC_OR_DEPARTMENT_OTHER): Payer: Medicare PPO | Admitting: Internal Medicine

## 2023-08-14 DIAGNOSIS — R0609 Other forms of dyspnea: Secondary | ICD-10-CM | POA: Diagnosis not present

## 2023-08-14 LAB — PULMONARY FUNCTION TEST
DL/VA % pred: 118 %
DL/VA: 4.76 ml/min/mmHg/L
DLCO cor % pred: 110 %
DLCO cor: 25.65 ml/min/mmHg
DLCO unc % pred: 111 %
DLCO unc: 25.8 ml/min/mmHg
FEF 25-75 Post: 4.02 L/s
FEF 25-75 Pre: 3.56 L/s
FEF2575-%Change-Post: 12 %
FEF2575-%Pred-Post: 198 %
FEF2575-%Pred-Pre: 175 %
FEV1-%Change-Post: 3 %
FEV1-%Pred-Post: 109 %
FEV1-%Pred-Pre: 105 %
FEV1-Post: 3.02 L
FEV1-Pre: 2.93 L
FEV1FVC-%Change-Post: 3 %
FEV1FVC-%Pred-Pre: 114 %
FEV6-%Change-Post: 0 %
FEV6-%Pred-Post: 97 %
FEV6-%Pred-Pre: 98 %
FEV6-Post: 3.5 L
FEV6-Pre: 3.51 L
FEV6FVC-%Pred-Post: 106 %
FEV6FVC-%Pred-Pre: 106 %
FVC-%Change-Post: 0 %
FVC-%Pred-Post: 91 %
FVC-%Pred-Pre: 92 %
FVC-Post: 3.5 L
FVC-Pre: 3.51 L
Post FEV1/FVC ratio: 86 %
Post FEV6/FVC ratio: 100 %
Pre FEV1/FVC ratio: 83 %
Pre FEV6/FVC Ratio: 100 %
RV % pred: 122 %
RV: 2.92 L
TLC % pred: 98 %
TLC: 6.37 L

## 2023-08-14 NOTE — Progress Notes (Signed)
 Full PFT Performed Today

## 2023-08-14 NOTE — Patient Instructions (Signed)
 Full PFT Performed Today

## 2023-08-22 NOTE — Progress Notes (Signed)
 [facility] [address] [place]. [place] [address] [phone]       OFFICE FOLLOW-UP NOTE  Mr. [name] Date of Birth:  02/20/1951 Medical Record Number:  [id number]   Primary neurologist: Dr. Pearlean Brownie Reason for visit: hx of stroke, seizure-like episodes  Chief Complaint  Patient presents with   Follow-up    Patient in room #3 with his wife. Patient states he been having breathing issues. Patient wife states he had five seizure the past four months. Patient states he been having headache and even waking up with headaches.      HISTORY:  Jeff [name] is a 73 y.o. male with hx of left cerebellum DWI negative stroke on 08/09/2018 due to left VA occlusion with residual imbalance/gait impairment and right-sided ataxia, TIA 12/2020, transient AMS 08/2019 possibly in setting of hypoglycemia and recurrent episodes of "zoning out" in spring 2023, and left-sided headaches. Recurrent transient AMS 10/2022 in setting of hypoglycemia with repeat EEG negative and again in 11/2022 (see below)    HPI:   Update 08/26/2023 JM: Patient returns for 10-month follow-up visit accompanied by his wife.   Reports continued transient AMS episodes, wife reports 2 episodes (back to back) on 1/21 and 1/25 and additional episode on 3/7 after stress test. Reports glucose levels were within normal range but wife unsure exact level.  Episode similar to prior events.  Continues to have frequent headaches, Topamax dosage increased at prior visit to 100 mg twice daily but he is unsure if this provided any benefit.  He frequently wakes with morning headaches and can have headaches almost daily.  Use of Tylenol as needed about 2-3 times per week for more severe headaches. Wife is concerned about possible underlying depression, he sleeps majority of the day and feels he hasn't been his normal, outgoing self. He feels this more due to retiring and not having much to do during the day, he does not feel he  struggles with depression or anxiety.  He can have sleeping difficulty at times, previously tried a sleeping pill but caused significant daytime fatigue.  He does carry a diagnosis of OSA through [place] but intolerant to CPAP.  He continues to experience right hand numbness and gait abnormality.  Reports improvement of gait after working with PT, completed in January, he questions restarting PT as he found this helpful but still has gait difficulty.  He avoids ambulating on uneven surfaces such as walking in grass, use of cane as needed.  Reports great improvement of prior right hand weakness after working with OT. MRI brain 04/2023 no acute abnormalities and essentially unchanged since prior MRI in 11/2022  No new stroke/TIA symptoms since prior visit.  Remains on aspirin and Crestor.  Routinely follows with PCP for stroke risk factor management.  Is currently being closely followed by pulmonology and cardiology for persistent SOB with exertion, this has been significantly limiting his daily activity and functioning.       History provided for reference purposes only Update 03/04/2023 JM: Returns today accompanied by his wife.  Previously seen by Dr. Teresa Coombs after episode of transient alteration of awareness in setting of hypoglycemia, repeat EEG negative.  Hospitalized 6/18 with recurrent transient AMS and unresponsiveness while working with PT (mouth open, eyes open, unresponsive but did blink to loud noise), noted glucose 71, patient given candy and D10 and mentation recovered, during transport to ED patient found unresponsive and suspected seizure lasting 2 minutes with left-sided gaze preference, administered Ativan and  several rounds of D50. MRI brain negative for acute abnormality.  Recurrent event in ED with glucose 51. Noted recently starting on Ozempic with altered appetite and continued use of insulin.  Repeat EEG unremarkable, evaluated by neurology and felt recurrent events most likely in  setting of hypoglycemia.  Reports additional episodes since then, most recently on 9/22. Wife has noticed episodes are occurring with glucose levels now in the 90s. They have set his glucose meter to alert with levels lower than 100. Will return back to baseline after improvement of glucose levels.  Routinely followed by endocrinology, prior visit 8/30, remains on insulin, wife reports they recommended holding Jardiance and lisinopril, has f/u in December. Glucose levels have been ranging 250s-300s on average.   He also c/o occasional headaches, at times can be once a week and other weeks can occur 3-4x, reports continued use of topiramate 50 mg twice daily.   He also reports continued gait difficulty especially with use of stairs, feels this has worsened over the past couple of months. Wife notes patient dragging right leg.  He has not noticed any weakness, denies lower back or hip pain. Has had a couple falls but thankfully without significant injury. Upon further questioning, reports right hand feeling asleep over the past couple of months, denies weakness. Completed 8 sessions of PT but remainder of PT visits cancelled after hospitalization in June. He is interested in restarting.  Update 10/18/2022 Dr. Teresa Coombs: This is a 73 year old gentleman past medical history of hypertension, hyperlipidemia, diabetes mellitus, TIA, previous stroke who is presenting after being admitted to the hospital for an episode of altered mental status/unresponsiveness.  Patient reports being at home, sitting on recliner and his glucose monitor warned him for low blood sugar.  Wife gave him two sips of soda and then he became unresponsive.  Wife reports eyes were open, tongue rolled back, initially unresponsive to touch and he started flailing.  EMS was called, per chart review when EMS arrived he was still hypoglycemic and he received dextrose.  He was taken to the hospital.  His initial workup including head CT was negative.   Wife reports that he did have episodes similar but not this severe and those episodes were not related to low blood sugar.  He had an EEG a year ago which was negative for any epileptic epileptiform discharge but show mild diffuse slowing.  Since discharge from the hospital he is back to his normal self, no other complaints    Update 08/29/2022 JM: Patient returns for 43-month follow-up accompanied by his wife.  Headaches currently well-controlled on topiramate 50 mg twice daily, notes improvement since prior visit. Unable to say how many per month but per wife, he rarely complains of headaches. Will take Tylenol as needed with improvement.  He has not had any additional transient episodes of zoning out Does report episode of upper and lower extremity tremor back in February, noted significantly elevated glucose levels in the 400s, administered insulin and quickly drank 3 bottles of water, glucose levels dropped rapidly down to the 50s and then symptoms started which subsided quickly.  Did not lose consciousness or had altered awareness.  No other associated symptoms.  Has not had any additional symptoms since that time. Denies any further episodes of rapid changes of glucose levels or hypoglycemia.  Does complain of gait difficulty which is chronic, denies any worsening, does have bilateral lower extremity neuropathy which has been nonprogressive, had a near fall 2 weeks ago, apparently was trying  to kneel at church and went down too quickly and landed hard on his knee, he did not fall over or hit his head.  Denies any other falls.  Denies any new stroke/TIA symptoms.  Compliant on aspirin and Crestor.  Blood pressure well-controlled.  [facility]  Update 03/13/2022 JM: Patient returns for follow-up visit.  Accompanied by his wife.  Headaches: Reports some improvement of headaches not as severe or frequent but still present.  Currently prescribed topiramate 25 mg twice daily, per  wife does not take consistently, sometimes will take just as needed. He is willing to take twice daily as prescribed  Transient episodes: He has not had any additional episodes since prior visit.  At prior visit, reported 2 episodes in March and May of "zoning out", did not lose consciousness, able to hear people around him, able to follow commands but unable to speak.  Becomes emotional during event.  Last 3 to 4 minutes.  Had fatigue after.  Completed EEG which was negative.  Repeat MRI brain negative for acute findings or changes compared to prior imaging 1 year ago.  Stable from stroke standpoint.  No new stroke/TIA symptoms.  Compliant on aspirin and Crestor.  Blood pressure well controlled.  [facility] Has been working on diet, has being losing weight intentionally, cut down on red meats, eating more chicken, salad, seafood, veggies and fruits.  Is scheduled to see PCP at the [place] in December.  Update 11/30/2021 JM: Patient returns for follow-up visit after prior visit just about 6 months ago.  Accompanied by his wife.   Prior concerns of left-sided orbital headache have persisted, inflammatory markers negative. Seen by ophthalmologist Dr. Ashley Royalty who did not feel headache related to his retinopathy or any other eye condition. Currently, occurring 2-3x per week, previously using Tylenol with resolution but is unable to continue to use due to altering results of Dexcom meter.  Headaches will typically resolve after laying down and taking a nap.  Occasional photophobia, denies phonophobia or N/V.  Denies any prior history of headaches or migraines or any family history.  Also mentions 2 episodes in March and May of "zoning out", does not lose conscousness, able to hear people around him, able to follow commands but unable to speak. Does report sensation of dizziness and hazy vision prior to episodes. Wife notes he becomes emotional during episode which he is unable to control. Lasts  approx 3 to 4 minutes. Reports fatigue after episode. Does mention episode in May, noted hypoglycemic but unable to recall level.  Blood pressure not checked during these times.  No prior diagnosis of seizures or family history of seizures.  Has had prior EEG in 03/2020 which was normal after transient disorientation and speech difficulty in setting of hypoglycemia with resolution of symptoms after correction.  Compliant on aspirin and Crestor, denies side effects.  Blood pressure today 111/68.  [facility] with [facility] work which has been satisfactory (unable to view via epic).  No further concerns at this time   Update 06/07/2020 JM: Mr. Schmieder is here today for a stroke follow-up accompanied by his wife.  Denies new or reoccurring stroke/TIA symptoms. Does report left orbital headache 2-3x per day present past 2 months - quick sharp pain - only lasts for a few seconds. Will have occasional dull headache in same area relieved by Tylenol. No visual changes.  Denies photophobia, phonophobia or N/V. Does follow with Dr. Dione Booze and Dr. Jerolyn Center (  retina) - has f/u with Dr. Ashley Royalty 1/27.  No prior history of headaches or migraines.  Recently established care with endocrinologist Dr. Talmage Nap with improvement of glucose levels.  Prior A1c 7.8 (02/2021) - believes more recent level completed but unknown level -unable to view via epic. Remains on Aspirin and Crestor without side effects. More recent lipid panel 01/2021 LDL 41. BP today 128/82.  Does not routinely monitor at home.  No further concerns at this time   UPDATE 03/07/2021 PS: Mr. Tillett is a pleasant 73 year old Caucasian male seen today for initial office follow-up visit following hospital consultation for TIA.  History is obtained from the patient and and wife and have personally reviewed electronic medical records as well as pertinent available imaging films in PACS. Mr. Raben has past medical history of diabetes, pretension, hyperlipidemia,  obstructive sleep apnea and prior stroke with some residual right-sided weakness and gait difficulty who presented on 01/01/2021 with sudden onset of word finding difficulties, slurred speech and fall due to bilateral right greater than left leg weakness.  He was fine when he went to bed the night prior but when he woke up he could not remember the exact time but noticed that his legs were weak and he had a fall while trying to get up and when he tried to speak his speech was not right.  His speech was slurred and he had trouble getting the words out.  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  He had a prior history of a stroke in March 2020 which was a punctate left medullary infarct for which she was treated with tPA with modest improvement but with some residual right-sided deficits and gait imbalance.  MRI scan of the brain showed no acute abnormality and showed chronic lacunar infarcts in the right basal ganglia carotid ultrasound showed only 1-39% bilateral carotid stenosis.  2D echo showed ejection fraction of 60 to 65% without definite clot.  LDL cholesterol 41 mg percent and hemoglobin A1c was elevated 8.6.  Patient also had diagnosis of a catheter angiogram on 01/03/2021 which showed only moderate 50% bilateral cavernous carotid stenosis and 50 to 60% proximal right PCA stenosis.  Left vertebral artery was occluded in the distal segment.  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  There was felt to be high-grade stenosis of the superior division of the left middle cerebral artery but this was difficult to appreciate on the corresponding CT angio and MR angiogram images and states it was felt that patient should be  treated with maximal medical therapy and was discharged on aspirin and Brilinta for 3 months..   He states he is doing well is tolerating both medications without significant bruising or bleeding.  Blood pressures under good control today it is 109/72.  Is tolerating Crestor well without muscle aches and pains.  His sugars however fluctuate quite a bit.  He has an appointment to see endocrinologist to discuss better diabetes control.  Patient does have history of sleep apnea and snores a lot.  However he has tried 4 different CPAP mask and has not been able to tolerate them.  He sees a neurologist in the [place] system and plans to discuss alternative treatment options  at next visit.  He is currently doing outpatient physical occupational therapy.  He still has mild paresthesias in his feet from diabetic neuropathy.  He is also getting some outpatient occupational therapy for right frozen shoulder at the [place].  He has no new complaints.    ROS:   14 system review of systems is positive for those listed in HPI and all other systems negative  PMH:  Past Medical History:  Diagnosis Date   ADHD (attention deficit hyperactivity disorder)    CVA (cerebral vascular accident) (HCC) 2022   Diabetes mellitus    Hyperlipidemia    Hypertension    Renal disorder    Seizures (HCC)    Stroke (HCC)     Social History:  Social History   Socioeconomic History   Marital status: Married    Spouse name: [name]   Number of children: 2   Years of education: Not on file   Highest education level: Not on file  Occupational History   Occupation: Archivist  Tobacco Use   Smoking status: Never   Smokeless tobacco: Never  Vaping Use   Vaping status: Never Used  Substance and Sexual Activity   Alcohol use: No    Alcohol/week: 0.0 standard drinks of alcohol   Drug use: No   Sexual activity: Yes  Other Topics Concern   Not on file  Social History Narrative   Lives with wife   Social  Drivers of Health   Financial Resource Strain: Not on file  Food Insecurity: No Food Insecurity (11/20/2022)   Hunger Vital Sign    Worried About Running Out of Food in the Last Year: Never true    Ran Out of Food in the Last Year: Never true  Transportation Needs: No Transportation Needs (11/20/2022)   PRAPARE - Administrator, Civil Service (Medical): No    Lack of Transportation (Non-Medical): No  Physical Activity: Not on file  Stress: Not on file  Social Connections: Not on file  Intimate Partner Violence: Not At Risk (11/20/2022)   Humiliation, Afraid, Rape, and Kick questionnaire    Fear of Current or Ex-Partner: No    Emotionally Abused: No    Physically Abused: No    Sexually Abused: No    Medications:   Current Outpatient Medications on File Prior to Visit  Medication Sig Dispense Refill   acetaminophen (TYLENOL) 500 MG tablet Take 500-1,000 mg by mouth every 6 (six) hours as needed for headache.     amLODipine (NORVASC) 2.5 MG tablet Take 1 tablet (2.5 mg total) by mouth daily. 30 tablet 2   aspirin EC 81 MG EC tablet Take 1 tablet (81 mg total) by mouth daily. Swallow whole. 30 tablet 11   Cholecalciferol (VITAMIN D) 50 MCG (2000 UT) tablet Take 2,000 Units by mouth daily.     cycloSPORINE (RESTASIS) 0.05 % ophthalmic emulsion Place 1 drop into both eyes 2 (two) times daily.     empagliflozin (JARDIANCE) 10 MG TABS tablet Take 10 mg by mouth daily.     furosemide (LASIX) 20 MG tablet Take 0.5 tablets (10 mg total) by mouth daily as needed for edema (Leg swelling). 30 tablet 1   Glucagon 1 MG/0.2ML SOAJ Inject 1 mg into the skin as needed (low blood sugar). 0.2 mL 0   glucose blood (ONETOUCH VERIO) test strip 1 each by Other route 4 (four) times daily -  before meals and at bedtime. dexcom 6     insulin  aspart (NOVOLOG FLEXPEN) 100 UNIT/ML FlexPen Inject 0-20 Units into the skin See admin instructions. Sliding scale per endocrinologist. (3 times with each meal)  Less than 100 = 0  100 -150 = 5 units  150-200 = 8 units   200-250 =12 units 15 mL 11   insulin glargine (LANTUS) 100 UNIT/ML injection Inject 0.1 mLs (10 Units total) into the skin 2 (two) times daily. (Patient taking differently: Inject 25-30 Units into the skin See admin instructions. Take 30 units sq in the morning and then take 25 units at night)     Insulin Syringe-Needle U-100 (B-D INS SYR ULTRAFINE 1CC/31G) 31G X 5/16" 1 ML MISC Used to inject insulin twice daily. 90 each 2   levothyroxine (SYNTHROID) 50 MCG tablet Take 50 mcg by mouth daily before breakfast.     Magnesium Oxide 420 MG TABS Take 1 tablet by mouth 2 (two) times daily.     Multiple Vitamins-Minerals (ICAPS AREDS 2 PO) Take 1 capsule by mouth in the morning and at bedtime.     nitroGLYCERIN (NITROSTAT) 0.4 MG SL tablet Place 1 tablet (0.4 mg total) under the tongue every 5 (five) minutes as needed for chest pain. 25 tablet 3   pantoprazole (PROTONIX) 40 MG tablet Take 1 tablet (40 mg total) by mouth daily.     rosuvastatin (CRESTOR) 40 MG tablet TAKE 1 TABLET BY MOUTH DAILY (Patient taking differently: Take 40 mg by mouth every evening.) 90 tablet 0   Semaglutide, 1 MG/DOSE, (OZEMPIC, 1 MG/DOSE,) 2 MG/1.5ML SOPN Inject 2 mg into the skin once a week.     sodium bicarbonate 650 MG tablet Take 650 mg by mouth 2 (two) times daily.     tamsulosin (FLOMAX) 0.4 MG CAPS capsule Take 1 capsule (0.4 mg total) by mouth daily. 30 capsule 0   topiramate (TOPAMAX) 100 MG tablet Take 1 tablet (100 mg total) by mouth 2 (two) times daily. 60 tablet 5   No current facility-administered medications on file prior to visit.    Allergies:   Allergies  Allergen Reactions   Flexeril [Cyclobenzaprine Hcl] Other (See Comments)    Causes him to pass out and lowers his BP   Lidocaine Swelling    throat   Novocain [Procaine Hcl] Swelling    Physical Exam Today's Vitals   08/26/23 1318  BP: 111/66  Pulse: 74  Weight: 184 lb (83.5 kg)   Height: 5\' 7"  (1.702 m)   Body mass index is 28.82 kg/m.   General: well developed, well nourished very pleasant elderly Caucasian male, seated, in no evident distress  Neurologic Exam Mental Status: Awake and fully alert.  Fluent speech and language.  Oriented to place and time. Recent and remote memory intact. Attention span, concentration and fund of knowledge appropriate. Mood and affect appropriate.  Cranial Nerves: Pupils equal, briskly reactive to light. Extraocular movements full without nystagmus. Visual fields full to confrontation. Hearing intact. Facial sensation intact. Tongue, palate moves normally and symmetrically. Right lower facial weakness - new since prior visit Motor: Normal strength, bulk and tone left upper and lower extremity. RUE 4/5 deltoid (chronic from prior shoulder surgery), otherwise 5/5.  Possibly very slight RLE weakness but also with giveaway weakness. R>L postural tremor, intermittent slight RUE resting tremor.  No tremor in lower extremities or face.  Slightly decreased finger tapping RUE, mild rigidity. Sensory.: intact to touch ,pinprick but slightly impaired.position and vibratory sensation over both feet bilaterally..   Coordination: Rapid alternating movements decreased right hand.  Finger-to-nose and heel-to-shin mild right sided ataxia - chronic Gait and Station: Arises from chair without difficulty. Stance is slightly stooped.  Gait abnormality with decreased stride length and step height of RLE, can lift higher when walking but challenging, imbalance greater with turns.  Decreased arm swing bilaterally.  Tandem walk and heel toe not attempted Reflexes: 1+ and symmetric. Toes downgoing.        ASSESSMENT/PLAN: Jeff [name] is a 73 y.o. year old male with left cerebellum DWI negative infarct s/p TPA on 08/09/2018 secondary to left VA occlusion versus arthrosclerosis given risk factors and ICA siphon atherosclerosis and residual right-sided ataxia and gait  impairment, and left hemispheric TIA 12/2020.  Vascular risk factors include HTN, HLD, intracranial stenosis and DM. Onset of left orbital headaches starting around 04/2021 and recurrent transient altered awareness in setting of hypoglycemia 08/2021, 10/2021, 10/2022 and 11/2022 but more recent episodes in 06/2023 and 08/2023 not associated with hypoglycemia. 02/2023 evidence of right sided weakness and subjective right hand numbness with worsening gait and dragging of RLE and worsening headaches over the past 2 months of unclear etiology with MRI brain no acute abnormality.     Hx of stroke and TIA Right sided deficits and gait impairment. Some improvement since prior visit after completion of PT/OT. Patient requesting restart of PT due to continued gait difficulty.  Referral will be placed as requested.  Some features consistent with parkinsonism such as RUE resting tremor with bradykinesia and rigidity and gait impairment with postural instability and decreased arm swing. Will continue to monitor for now. No fm hx of PD.  MRI brain 04/2023 no acute abnormalities Continuation of aspirin and Crestor for secondary stroke prevention managed/prescribed by PCP Continue to follow with PCP and endocrinology for aggressive stroke risk factor management  Orbital headaches Persistent almost daily headaches and frequent morning headaches Initiate amitriptyline 10 mg nightly, also advised potential benefit with concern of underlying depression and insomnia, discussed potential side effects, advised to call after 2 to 3 weeks if no benefit for dosage increase or sooner if difficulty tolerating Continue topiramate to 100 mg twice daily Continue Tylenol as needed Negative inflammatory markers Does have hx of OSA with CPAP intolerance, he was encouraged [facility] to discuss other treatment options as untreated apnea may be contributing to persistent headaches and especially morning headaches.    Transient episodes Unknown etiology with recurrent EEGs negative More recent episodes not associated with hypoglycemia although wife unsure exact levels, she was advised to log glucose levels with episodes Advise close follow-up with endocrinology for adequate glucose control EEG 727/2023, 11/08/2022 and 11/21/2022 no definite epileptiform activity noted  MR brain 11/2022 no acute abnormality Currently on topiramate 100 mg twice daily (rx'd for HA prevention) Previously seen by Dr. Teresa Coombs for episodes, will reach out to see if he has any further recommendations  Carotid artery stenosis Left vertebral artery occlusion, chronic  Will plan on repeat CUS in 2026 for surveillance monitoring Carotid ultrasound 09/2022 bilateral ICA 1 to 39% stenosis, R VA antegrade flow, L VA high resistant flow Carotid ultrasound 01/2021 bilateral ICA 1 to 39% stenosis, R VA antegrade flow, L VA high resistant flow     Follow-up in 6 months or call earlier if needed    CC:  Anson Fret, MD   I spent 50 minutes of face-to-face and non-face-to-face time with patient and wife.  This included previsit chart review, lab review, study review, electronic health record documentation, patient  and wife education and discussion regarding above diagnoses and answered all other questions to patient and wife satisfaction  Ihor Austin, AGNP-BC  [facility] [address] [place], [place] [address]  Phone [phone] Fax [phone] Note: This document was prepared with digital dictation and possible smart phrase technology. Any transcriptional errors that result from this process are unintentional.

## 2023-08-26 ENCOUNTER — Encounter: Payer: Self-pay | Admitting: Adult Health

## 2023-08-26 ENCOUNTER — Ambulatory Visit: Payer: Medicare PPO | Admitting: Adult Health

## 2023-08-26 VITALS — BP 111/66 | HR 74 | Ht 67.0 in | Wt 184.0 lb

## 2023-08-26 DIAGNOSIS — R404 Transient alteration of awareness: Secondary | ICD-10-CM

## 2023-08-26 DIAGNOSIS — I6523 Occlusion and stenosis of bilateral carotid arteries: Secondary | ICD-10-CM | POA: Diagnosis not present

## 2023-08-26 DIAGNOSIS — Z8673 Personal history of transient ischemic attack (TIA), and cerebral infarction without residual deficits: Secondary | ICD-10-CM | POA: Diagnosis not present

## 2023-08-26 DIAGNOSIS — I6502 Occlusion and stenosis of left vertebral artery: Secondary | ICD-10-CM | POA: Diagnosis not present

## 2023-08-26 DIAGNOSIS — R269 Unspecified abnormalities of gait and mobility: Secondary | ICD-10-CM | POA: Diagnosis not present

## 2023-08-26 DIAGNOSIS — R519 Headache, unspecified: Secondary | ICD-10-CM | POA: Diagnosis not present

## 2023-08-26 MED ORDER — TOPIRAMATE 100 MG PO TABS: 100.0000 mg | ORAL_TABLET | Freq: Two times a day (BID) | ORAL | 5 refills | Status: DC

## 2023-08-26 MED ORDER — AMITRIPTYLINE HCL 10 MG PO TABS: 10.0000 mg | ORAL_TABLET | Freq: Every day | ORAL | 11 refills | Status: DC

## 2023-08-26 NOTE — Patient Instructions (Addendum)
 Your Plan:  Start amitriptyline (Elavil) 10mg  nightly to help with headaches, this can further help with sleep as well   Continue topamax 100mg  twice daily  I will follow up with Dr. Teresa Coombs regarding persistent episodes and will message you on MyChart regarding his recommendations   Referral placed to PT for gait impairment/imbalance   [facility] are [facility] as underlying sleep apnea may be contributing to your persistent headaches      Follow up in 6 months or call earlier if needed     Thank you for coming to see Jeff [name] at [facility]. I hope we have been able to provide you high quality care today.  You may receive a patient satisfaction survey over the next few weeks. We would appreciate your feedback and comments so that we may continue to improve ourselves and the health of our patients.    Amitriptyline Tablets What is this medication? AMITRIPTYLINE (a mee TRIP ti leen) treats depression. It increases the amount of serotonin and norepinephrine in the brain, hormones that help regulate mood. It belongs to a group of medications called tricyclic antidepressants (TCAs). This medicine may be used for other purposes; ask your health care provider or pharmacist if you have questions. COMMON BRAND NAME(S): Elavil, Vanatrip What should I tell my care team before I take this medication? They need to know if you have any of these conditions: Asthma, trouble breathing Bipolar disorder or schizophrenia Difficulty passing urine, prostate trouble Frequently drink alcohol Glaucoma Heart disease or previous heart attack Liver disease Seizures Suicidal thoughts, plans, or attempt by you or a family member Thyroid disease An unusual or allergic reaction to amitriptyline, other medications, foods, dyes, or preservatives Pregnant or trying to get pregnant Breastfeeding How should I use this medication? Take this  medication by mouth with a drink of water. Follow the directions on the prescription label. You can take the tablets with or without food. Take your medication at regular intervals. Do not take it more often than directed. Do not stop taking this medication suddenly except upon the advice of your care team. Stopping this medication too quickly may cause serious side effects or your condition may worsen. A special MedGuide will be given to you by the pharmacist with each prescription and refill. Be sure to read this information carefully each time. Talk to your care team regarding the use of this medication in children. Special care may be needed. Overdosage: If you think you have taken too much of this medicine contact a poison control center or emergency room at once. NOTE: This medicine is only for you. Do not share this medicine with others. What if I miss a dose? If you miss a dose, take it as soon as you can. If it is almost time for your next dose, take only that dose. Do not take double or extra doses. What may interact with this medication? Do not take this medication with any of the following: Arsenic trioxide Certain medications used to regulate abnormal heartbeat or to treat other heart conditions Cisapride Droperidol Halofantrine Linezolid MAOIs like Carbex, Eldepryl, Marplan, Nardil, and Parnate Methylene blue Other medications for mental depression Phenothiazines like perphenazine, thioridazine and chlorpromazine Pimozide Probucol Procarbazine Sparfloxacin St. John's Wort This medication may also interact with the following: Atropine and related medications like hyoscyamine, scopolamine, tolterodine and others Barbiturate medications for inducing sleep or treating seizures, like phenobarbital Cimetidine Disulfiram Ethchlorvynol Thyroid hormones  such as levothyroxine Ziprasidone This list may not describe all possible interactions. Give your health care provider a list of  all the medicines, herbs, non-prescription drugs, or dietary supplements you use. Also tell them if you smoke, drink alcohol, or use illegal drugs. Some items may interact with your medicine. What should I watch for while using this medication? Visit your care team for regular checks on your progress. It may take several weeks to see the full effects of this medication, and it is important to continue your treatment as prescribed by your care team. Tell your care team if your symptoms do not get better or if they get worse. Patients and their families should watch out for new or worsening thoughts of suicide or depression. Also watch out for sudden changes in feelings such as feeling anxious, agitated, panicky, irritable, hostile, aggressive, impulsive, severely restless, overly excited and hyperactive, or not being able to sleep. If this happens, especially at the beginning of treatment or after a change in dose, call your care team. This medication may affect your coordination, reaction time, or judgment. Do not drive or operate machinery until you know how this medication affects you. Sit up or stand slowly to reduce the risk of dizzy or fainting spells. Drinking alcohol with this medication can increase the risk of these side effects. Do not treat yourself for coughs, colds, or allergies while you are taking this medication without asking your care team for advice. Some ingredients can increase possible side effects. Your mouth may get dry. Chewing sugarless gum or sucking hard candy and drinking plenty of water may help. Contact your care team if the problem does not go away or is severe. This medication may cause dry eyes and blurred vision. If you wear contact lenses, you may feel some discomfort. Lubricating eye drops may help. See your care team if the problem does not go away or is severe. This medication can cause constipation. If you do not have a bowel movement for 3 days, call your care  team. This medication can make you more sensitive to the sun. Keep out of the sun. If you cannot avoid being in the sun, wear protective clothing and sunscreen. Do not use sun lamps, tanning beds, or tanning booths. What side effects may I notice from receiving this medication? Side effects that you should report to your care team as soon as possible: Allergic reactions--skin rash, itching, hives, swelling of the face, lips, tongue, or throat Heart rhythm changes-- fast or irregular heartbeat, dizziness, feeling faint or lightheaded, chest pain, trouble breathing Serotonin syndrome--irritability, confusion, fast or irregular heartbeat, muscle stiffness, twitching muscles, sweating, high fever, seizure, chills, vomiting, diarrhea Sudden eye pain or change in vision such as blurry vision, seeing halos around lights, vision loss Thoughts of suicide or self-harm, worsening mood, feelings of depression Side effects that usually do not require medical attention (report to your care team if they continue or are bothersome): Change in appetite or weight Change in sex drive or performance Constipation Dizziness Drowsiness Dry mouth Tremors This list may not describe all possible side effects. Call your doctor for medical advice about side effects. You may report side effects to FDA at 1-800-FDA-1088. Where should I keep my medication? Keep out of the reach of children and pets. Store at room temperature between 20 and 25 degrees C (68 and 77 degrees F). Throw away any unused medication after the expiration date. NOTE: This sheet is a summary. It may not cover all  possible information. If you have questions about this medicine, talk to your doctor, pharmacist, or health care provider.  2024 Elsevier/Gold Standard (2022-02-01 00:00:00)

## 2023-08-27 ENCOUNTER — Ambulatory Visit: Payer: Medicare PPO | Attending: Cardiology | Admitting: Cardiology

## 2023-08-27 ENCOUNTER — Encounter: Payer: Self-pay | Admitting: Cardiology

## 2023-08-27 VITALS — BP 122/74 | HR 76 | Resp 16 | Ht 67.0 in | Wt 196.4 lb

## 2023-08-27 DIAGNOSIS — I10 Essential (primary) hypertension: Secondary | ICD-10-CM

## 2023-08-27 DIAGNOSIS — E1122 Type 2 diabetes mellitus with diabetic chronic kidney disease: Secondary | ICD-10-CM

## 2023-08-27 DIAGNOSIS — R0609 Other forms of dyspnea: Secondary | ICD-10-CM | POA: Diagnosis not present

## 2023-08-27 DIAGNOSIS — N1832 Chronic kidney disease, stage 3b: Secondary | ICD-10-CM

## 2023-08-27 DIAGNOSIS — Z01812 Encounter for preprocedural laboratory examination: Secondary | ICD-10-CM

## 2023-08-27 DIAGNOSIS — I2089 Other forms of angina pectoris: Secondary | ICD-10-CM

## 2023-08-27 DIAGNOSIS — Z01810 Encounter for preprocedural cardiovascular examination: Secondary | ICD-10-CM | POA: Diagnosis not present

## 2023-08-27 NOTE — Patient Instructions (Addendum)
 Medication Instructions:  Your physician recommends that you continue on your current medications as directed. Please refer to the Current Medication list given to you today.  *If you need a refill on your cardiac medications before your next appointment, please call your pharmacy*  Lab Work: TODAY: CBC, BMET If you have labs (blood work) drawn today and your tests are completely normal, you will receive your results only by: MyChart Message (if you have MyChart) OR A paper copy in the mail If you have any lab test that is abnormal or we need to change your treatment, we will call you to review the results.  Testing/Procedures: Your physician has requested that you have a cardiac catheterization. Cardiac catheterization is used to diagnose and/or treat various heart conditions. Doctors may recommend this procedure for a number of different reasons. The most common reason is to evaluate chest pain. Chest pain can be a symptom of coronary artery disease (CAD), and cardiac catheterization can show whether plaque is narrowing or blocking your heart's arteries. This procedure is also used to evaluate the valves, as well as measure the blood flow and oxygen levels in different parts of your heart. For further information please visit [email or web address] Please follow instruction sheet, as given.  Follow-Up: At [facility], you and your health needs are our priority.  As part of our continuing mission to provide you with exceptional heart care, we have created designated Provider Care Teams.  These Care Teams include your primary Cardiologist (physician) and Advanced Practice Providers (APPs -  Physician Assistants and Nurse Practitioners) who all work together to provide you with the care you need, when you need it.  Your next appointment:   September 30, 2023 at 11:40 AM  The format for your next appointment:   In Person  Provider:   Yates Decamp, MD {  Other Instructions     Cardiac/Peripheral  Catheterization   You are scheduled for a Cardiac Catheterization on Wednesday, April 9 with Dr.  Yates Decamp .  1. Please arrive at the [facility] (Main Entrance A) at [facility]: [address] [place], [place] [address] at 8:00 AM (This time is 2 hour(s) before your procedure to ensure your preparation).   Free valet parking service is available. You will check in at ADMITTING. The support person will be asked to wait in the waiting room.  It is OK to have someone drop you off and come back when you are ready to be discharged.        Special note: Every effort is made to have your procedure done on time. Please understand that emergencies sometimes delay scheduled procedures.  2. Diet: Do not eat solid foods after midnight.  You may have clear liquids until 5 AM the day of the procedure.  3. Labs: TODAY (CBC, BMET)  4. Medication instructions in preparation for your procedure:   Contrast Allergy: No  Stop taking, Lasix (Furosemide)  Tuesday, April 8,  Take only 15 units of insulin the night before your procedure. Do not take any insulin on the day of the procedure. (Lantus--insulin glargine)  Stop taking Jardiance on Sunday April 5th  HOLD Ozempic starting April 7th  On the morning of your procedure, take Aspirin 81 mg and any morning medicines NOT listed above.  You may use sips of water.  5. Plan to go home the same day, you will only stay overnight if medically necessary. 6. You MUST have a responsible adult to drive you home.  7. An adult MUST be with you the first 24 hours after you arrive home. 8. Bring a current list of your medications, and the last time and date medication taken. 9. Bring ID and current insurance cards. 10.Please wear clothes that are easy to get on and off and wear slip-on shoes.  Thank you for allowing [place] to care for you!   --  Invasive Cardiovascular services

## 2023-08-27 NOTE — H&P (View-Only) (Signed)
 Cardiology Office Note:  .   Date:  08/28/2023  ID:  Jeff [name], DOB 03-Apr-1951, MRN [id number] PCP: Anson Fret, MD  [facility] Providers Cardiologist:  Yates Decamp, MD   History of Present Illness: .   Jeff [name] is a 73 y.o. 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     He was again seen in the emergency room on 07/19/2023 with substernal chest pain and dyspnea on exertion.  He continued to complain of exertional neck pain associated with dyspnea when I last saw him on 07/29/2023 and hence underwent stress testing and presents for follow-up.  He has had no further chest pain or neck pain since addition of amlodipine 2.5 mg daily but still continues to have severe lifestyle limiting dyspnea on exertion.  Discussed the use of AI scribe software for clinical note transcription with the patient, who gave verbal consent to proceed.  History of Present Illness   The patient, with a history of diabetes, kidney disease, and peripheral neuropathy, presents with worsening shortness of breath since January of this year. The shortness of breath has become so severe that it interferes with his activities of daily living, requiring assistance from his spouse for tasks such as showering. He also reports neck pain, which has resolved since starting amlodipine. Despite the resolution of the neck pain, the shortness of breath persists.  The patient was recently hospitalized for chest pain and shortness of breath. During this hospitalization, his lisinopril was discontinued due to worsening kidney function. He  was advised to follow up with outpatient cardiology and pulmonary doctors.  The patient's echocardiogram and stress test were both normal. A CT scan of the chest did not show any blood clots or scar tissue in the lungs. However, the CT scan did reveal some plaque buildup in the heart arteries. The patient also has peripheral neuropathy, likely due to diabetes, which causes instability when standing and walking.  The patient's BNP levels and chest x-ray did not suggest heart failure. The patient's cholesterol and diabetes are well controlled, with an LDL of 41 and an A1C of 6.7%.      Labs   Lab Results  Component Value Date   CHOL 96 01/02/2021   HDL 25 (L) 01/02/2021   LDLCALC 41 01/02/2021   TRIG 152 (H) 01/02/2021   CHOLHDL 3.8 01/02/2021   Lab Results  Component Value Date   NA 140 08/27/2023   K 3.7 08/27/2023   CO2 22 08/27/2023   GLUCOSE 136 (H) 08/27/2023   BUN 19 08/27/2023   CREATININE 1.56 (H) 08/27/2023   CALCIUM 9.1 08/27/2023   EGFR 46 (L) 08/27/2023   GFRNONAA 42 (L) 07/19/2023      Latest Ref Rng & Units 08/27/2023    3:50 PM 07/19/2023    2:26 PM 11/22/2022    3:57 AM  BMP  Glucose 70 - 99 mg/dL 621  308  657   BUN 8 - 27 mg/dL 19  21  24    Creatinine 0.76 - 1.27 mg/dL 8.46  9.62  9.52   BUN/Creat Ratio 10 - 24 12  Sodium 134 - 144 mmol/L 140  137  139   Potassium 3.5 - 5.2 mmol/L 3.7  3.6  3.8   Chloride 96 - 106 mmol/L 104  104  106   CO2 20 - 29 mmol/L 22  21  22    Calcium 8.6 - 10.2 mg/dL 9.1  9.5  9.2       Latest Ref Rng & Units 08/27/2023    3:50 PM 07/19/2023    2:26 PM 11/20/2022    2:38 PM  CBC  WBC 3.4 - 10.8 x10E3/uL 7.0  7.7    Hemoglobin 13.0 - 17.7 g/dL 16.1  09.6  04.5   Hematocrit 37.5 - 51.0 % 48.1  46.6  42.0   Platelets 150 - 450 x10E3/uL 199  223     Lab Results  Component Value Date   HGBA1C 6.7 (H) 11/20/2022    Lab Results  Component Value Date   TSH 1.915 02/27/2014    BNP (last 3 results) Recent Labs     07/19/23 1426  BNP 9.2   Review of Systems  Cardiovascular:  Positive for dyspnea on exertion. Negative for chest pain and leg swelling.   Physical Exam:   VS:  BP 122/74 (BP Location: Left Arm, Patient Position: Sitting, Cuff Size: Normal)   Pulse 76   Resp 16   Ht 5\' 7"  (1.702 m)   Wt 196 lb 6.4 oz (89.1 kg)   SpO2 97%   BMI 30.76 kg/m    Wt Readings from Last 3 Encounters:  08/27/23 196 lb 6.4 oz (89.1 kg)  08/26/23 184 lb (83.5 kg)  08/14/23 193 lb (87.5 kg)    Physical Exam Neck:     Vascular: No carotid bruit or JVD.  Cardiovascular:     Rate and Rhythm: Normal rate and regular rhythm.     Pulses: Intact distal pulses.     Heart sounds: Normal heart sounds. No murmur heard.    No gallop.  Pulmonary:     Effort: Pulmonary effort is normal.     Breath sounds: Normal breath sounds.  Abdominal:     General: Bowel sounds are normal.     Palpations: Abdomen is soft.  Musculoskeletal:     Right lower leg: No edema.     Left lower leg: No edema.    Studies Reviewed: .    Echocardiogram 07/15/2023: Left ventricular systolic function is normal. Ejection Fraction = >55% (Visual Estimation). No regional wall motion abnormalities noted. The right ventricular systolic function is normal. There is mild aortic sclerosis. The aortic valve opens well. No hemodynamically significant valvular aortic stenosis. The IVC is normal in size with an inspiratory collapse of greater then 50%, suggesting normal right atrial pressure.   MYOCARDIAL PERFUSION IMAGING 08/09/2023   Narrative   The study is normal. The study is low risk.   No ST deviation was noted.   LV perfusion is normal. There is no evidence of ischemia. There is no evidence of infarction.   Left ventricular function is normal. Nuclear stress EF: 63%. The left ventricular ejection fraction is normal (55-65%). End diastolic cavity size is normal. End systolic cavity size is normal.   Prior study not available for  comparison.   Normal stress nuclear study with no ischemia or infarction.  Gated ejection fraction 63% with normal wall motion.  EKG:    EKG Interpretation Date/Time:  Tuesday August 27 2023 15:05:33 EDT Ventricular Rate:  72 PR Interval:  160 QRS Duration:  94  QT Interval:  376 QTC Calculation: 411 R Axis:   -10  Text Interpretation: EKG 08/27/2023: Normal sinus rhythm at the rate of 72 bpm, incomplete right bundle branch block.  Borderline criteria for LVH.  Compared to 07/29/2023, no significant change. Confirmed by Delrae Rend [phone]) on 08/27/2023 3:08:36 PM    Medications and allergies    Allergies  Allergen Reactions   Flexeril [Cyclobenzaprine Hcl] Other (See Comments)    Causes him to pass out and lowers his BP   Lidocaine Swelling    throat   Novocain [Procaine Hcl] Swelling     Current Outpatient Medications:    acetaminophen (TYLENOL) 500 MG tablet, Take 500-1,000 mg by mouth every 6 (six) hours as needed for headache., Disp: , Rfl:    amitriptyline (ELAVIL) 10 MG tablet, Take 1 tablet (10 mg total) by mouth at bedtime., Disp: 30 tablet, Rfl: 11   amLODipine (NORVASC) 2.5 MG tablet, Take 1 tablet (2.5 mg total) by mouth daily., Disp: 30 tablet, Rfl: 2   aspirin EC 81 MG EC tablet, Take 1 tablet (81 mg total) by mouth daily. Swallow whole., Disp: 30 tablet, Rfl: 11   Cholecalciferol (VITAMIN D) 50 MCG (2000 UT) tablet, Take 2,000 Units by mouth daily., Disp: , Rfl:    cycloSPORINE (RESTASIS) 0.05 % ophthalmic emulsion, Place 1 drop into both eyes 2 (two) times daily., Disp: , Rfl:    empagliflozin (JARDIANCE) 10 MG TABS tablet, Take 10 mg by mouth daily., Disp: , Rfl:    furosemide (LASIX) 20 MG tablet, Take 0.5 tablets (10 mg total) by mouth daily as needed for edema (Leg swelling)., Disp: 30 tablet, Rfl: 1   Glucagon 1 MG/0.2ML SOAJ, Inject 1 mg into the skin as needed (low blood sugar)., Disp: 0.2 mL, Rfl: 0   glucose blood (ONETOUCH VERIO) test strip, 1 each by  Other route 4 (four) times daily -  before meals and at bedtime. dexcom 6, Disp: , Rfl:    insulin aspart (NOVOLOG FLEXPEN) 100 UNIT/ML FlexPen, Inject 0-20 Units into the skin See admin instructions. Sliding scale per endocrinologist. (3 times with each meal) Less than 100 = 0  100 -150 = 5 units  150-200 = 8 units   200-250 =12 units, Disp: 15 mL, Rfl: 11   insulin glargine (LANTUS) 100 UNIT/ML injection, Inject 0.1 mLs (10 Units total) into the skin 2 (two) times daily. (Patient taking differently: Inject 25-30 Units into the skin See admin instructions. Take 30 units sq in the morning and then take 25 units at night), Disp: , Rfl:    Insulin Syringe-Needle U-100 (B-D INS SYR ULTRAFINE 1CC/31G) 31G X 5/16" 1 ML MISC, Used to inject insulin twice daily., Disp: 90 each, Rfl: 2   levothyroxine (SYNTHROID) 50 MCG tablet, Take 50 mcg by mouth daily before breakfast., Disp: , Rfl:    Magnesium Oxide 420 MG TABS, Take 1 tablet by mouth 2 (two) times daily., Disp: , Rfl:    Multiple Vitamins-Minerals (ICAPS AREDS 2 PO), Take 1 capsule by mouth in the morning and at bedtime., Disp: , Rfl:    nitroGLYCERIN (NITROSTAT) 0.4 MG SL tablet, Place 1 tablet (0.4 mg total) under the tongue every 5 (five) minutes as needed for chest pain., Disp: 25 tablet, Rfl: 3   pantoprazole (PROTONIX) 40 MG tablet, Take 1 tablet (40 mg total) by mouth daily., Disp: , Rfl:    rosuvastatin (CRESTOR) 40 MG tablet, TAKE 1 TABLET BY MOUTH DAILY (Patient taking differently: Take 40  mg by mouth every evening.), Disp: 90 tablet, Rfl: 0   Semaglutide, 1 MG/DOSE, (OZEMPIC, 1 MG/DOSE,) 2 MG/1.5ML SOPN, Inject 2 mg into the skin once a week., Disp: , Rfl:    sodium bicarbonate 650 MG tablet, Take 650 mg by mouth 2 (two) times daily., Disp: , Rfl:    tamsulosin (FLOMAX) 0.4 MG CAPS capsule, Take 1 capsule (0.4 mg total) by mouth daily., Disp: 30 capsule, Rfl: 0   topiramate (TOPAMAX) 100 MG tablet, Take 1 tablet (100 mg total) by mouth 2 (two)  times daily., Disp: 60 tablet, Rfl: 5   ASSESSMENT AND PLAN: .      ICD-10-CM   1. Stable angina pectoris (HCC)  I20.89 EKG 12-Lead    2. Dyspnea on exertion  R06.09     3. Primary hypertension  I10     4. Type 2 diabetes mellitus with stage 3b chronic kidney disease, without long-term current use of insulin (HCC)  E11.22    N18.32     5. Pre-procedure lab exam  [phone] Basic metabolic panel    CBC      No orders of the defined types were placed in this encounter.   There are no discontinued medications.   Assessment and Plan    Shortness of breath   He experiences worsening shortness of breath since January 2025, significantly impacting daily activities. Initial evaluations, including echocardiogram and stress test, were normal. A CT scan ruled out pulmonary embolism and lung scarring. Differential diagnosis includes cardiac causes, pulmonary issues, or deconditioning due to diabetic peripheral neuropathy.  Pulmonary etiology was felt to be very low as per pulmonary evaluation.  PFTs are pending but preliminary report says normal study.  He has no COPD or history of smoking in the past.  Despite normal stress test results, undetected coronary artery disease is suspected in view of nearly class III-IV dyspnea and significant change in his lifestyle since December 2024. Right and left heart catheterization is planned to assess for coronary artery blockages. The procedure carries risks, including less than 1% risk of death, stroke, myocardial infarction, or urgent bypass, and a 1 in 1000 risk of cardiac arrest. Renal function will be monitored closely due to potential nephrotoxic effects of contrast dye. He should consume plenty of beets two days prior to the procedure to protect renal function. If blockages are found, consider stenting or discuss the need for bypass surgery.  Chronic kidney disease   Underlying renal issues require caution with contrast dye during cardiac procedures.  The amount of x-ray dye will be limited to minimize risk to renal function.  Acute renal failure and need for dialysis risk is <5%.  Primary hypertension Blood pressure was soft when I last saw him, fortunately is tolerating amlodipine 2.5 mg daily.  Not much room to wiggle with regard to anginal symptom management hence proceeding with cardiac catheterization is appropriate as well.  Hypotension leading to worsening renal function is also possibility.  Hence no ACE inhibitors or ARB at this time but will consider this in future.  Diabetic peripheral neuropathy   He shows signs of peripheral neuropathy, contributing to instability and leg weakness, possibly exacerbating deconditioning and shortness of breath.  Diabetes mellitus   Diabetes is well-controlled with an A1c of 6.7%.  Hyperlipidemia   Cholesterol levels are well-controlled with an LDL of 41.  Follow-up   Schedule a follow-up appointment in one to two months post-heart catheterization. Labs reviewed for precath eval, BMP, renal function stable and CBC  stable to proceed       Signed,  Yates Decamp, MD, [facility] 08/28/2023, 6:20 AM [facility] [address] [place], [place] [address] Phone: [phone]. Fax:  [phone]

## 2023-08-27 NOTE — Progress Notes (Signed)
 Cardiology Office Note:  .   Date:  08/28/2023  ID:  Jeff [name], DOB July 04, 1950, MRN [id number] PCP: Anson Fret, MD  [facility] Providers Cardiologist:  Yates Decamp, MD   History of Present Illness: .   Jeff [name] is a 73 y.o. 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     He was again seen in the emergency room on 07/19/2023 with substernal chest pain and dyspnea on exertion.  He continued to complain of exertional neck pain associated with dyspnea when I last saw him on 07/29/2023 and hence underwent stress testing and presents for follow-up.  He has had no further chest pain or neck pain since addition of amlodipine 2.5 mg daily but still continues to have severe lifestyle limiting dyspnea on exertion.  Discussed the use of AI scribe software for clinical note transcription with the patient, who gave verbal consent to proceed.  History of Present Illness   The patient, with a history of diabetes, kidney disease, and peripheral neuropathy, presents with worsening shortness of breath since January of this year. The shortness of breath has become so severe that it interferes with his activities of daily living, requiring assistance from his spouse for tasks such as showering. He also reports neck pain, which has resolved since starting amlodipine. Despite the resolution of the neck pain, the shortness of breath persists.  The patient was recently hospitalized for chest pain and shortness of breath. During this hospitalization, his lisinopril was discontinued due to worsening kidney function. He  was advised to follow up with outpatient cardiology and pulmonary doctors.  The patient's echocardiogram and stress test were both normal. A CT scan of the chest did not show any blood clots or scar tissue in the lungs. However, the CT scan did reveal some plaque buildup in the heart arteries. The patient also has peripheral neuropathy, likely due to diabetes, which causes instability when standing and walking.  The patient's BNP levels and chest x-ray did not suggest heart failure. The patient's cholesterol and diabetes are well controlled, with an LDL of 41 and an A1C of 6.7%.      Labs   Lab Results  Component Value Date   CHOL 96 01/02/2021   HDL 25 (L) 01/02/2021   LDLCALC 41 01/02/2021   TRIG 152 (H) 01/02/2021   CHOLHDL 3.8 01/02/2021   Lab Results  Component Value Date   NA 140 08/27/2023   K 3.7 08/27/2023   CO2 22 08/27/2023   GLUCOSE 136 (H) 08/27/2023   BUN 19 08/27/2023   CREATININE 1.56 (H) 08/27/2023   CALCIUM 9.1 08/27/2023   EGFR 46 (L) 08/27/2023   GFRNONAA 42 (L) 07/19/2023      Latest Ref Rng & Units 08/27/2023    3:50 PM 07/19/2023    2:26 PM 11/22/2022    3:57 AM  BMP  Glucose 70 - 99 mg/dL 782  956  213   BUN 8 - 27 mg/dL 19  21  24    Creatinine 0.76 - 1.27 mg/dL 0.86  5.78  4.69   BUN/Creat Ratio 10 - 24 12  Sodium 134 - 144 mmol/L 140  137  139   Potassium 3.5 - 5.2 mmol/L 3.7  3.6  3.8   Chloride 96 - 106 mmol/L 104  104  106   CO2 20 - 29 mmol/L 22  21  22    Calcium 8.6 - 10.2 mg/dL 9.1  9.5  9.2       Latest Ref Rng & Units 08/27/2023    3:50 PM 07/19/2023    2:26 PM 11/20/2022    2:38 PM  CBC  WBC 3.4 - 10.8 x10E3/uL 7.0  7.7    Hemoglobin 13.0 - 17.7 g/dL 40.9  81.1  91.4   Hematocrit 37.5 - 51.0 % 48.1  46.6  42.0   Platelets 150 - 450 x10E3/uL 199  223     Lab Results  Component Value Date   HGBA1C 6.7 (H) 11/20/2022    Lab Results  Component Value Date   TSH 1.915 02/27/2014    BNP (last 3 results) Recent Labs     07/19/23 1426  BNP 9.2   Review of Systems  Cardiovascular:  Positive for dyspnea on exertion. Negative for chest pain and leg swelling.   Physical Exam:   VS:  BP 122/74 (BP Location: Left Arm, Patient Position: Sitting, Cuff Size: Normal)   Pulse 76   Resp 16   Ht 5\' 7"  (1.702 m)   Wt 196 lb 6.4 oz (89.1 kg)   SpO2 97%   BMI 30.76 kg/m    Wt Readings from Last 3 Encounters:  08/27/23 196 lb 6.4 oz (89.1 kg)  08/26/23 184 lb (83.5 kg)  08/14/23 193 lb (87.5 kg)    Physical Exam Neck:     Vascular: No carotid bruit or JVD.  Cardiovascular:     Rate and Rhythm: Normal rate and regular rhythm.     Pulses: Intact distal pulses.     Heart sounds: Normal heart sounds. No murmur heard.    No gallop.  Pulmonary:     Effort: Pulmonary effort is normal.     Breath sounds: Normal breath sounds.  Abdominal:     General: Bowel sounds are normal.     Palpations: Abdomen is soft.  Musculoskeletal:     Right lower leg: No edema.     Left lower leg: No edema.    Studies Reviewed: .    Echocardiogram 07/15/2023: Left ventricular systolic function is normal. Ejection Fraction = >55% (Visual Estimation). No regional wall motion abnormalities noted. The right ventricular systolic function is normal. There is mild aortic sclerosis. The aortic valve opens well. No hemodynamically significant valvular aortic stenosis. The IVC is normal in size with an inspiratory collapse of greater then 50%, suggesting normal right atrial pressure.   MYOCARDIAL PERFUSION IMAGING 08/09/2023   Narrative   The study is normal. The study is low risk.   No ST deviation was noted.   LV perfusion is normal. There is no evidence of ischemia. There is no evidence of infarction.   Left ventricular function is normal. Nuclear stress EF: 63%. The left ventricular ejection fraction is normal (55-65%). End diastolic cavity size is normal. End systolic cavity size is normal.   Prior study not available for  comparison.   Normal stress nuclear study with no ischemia or infarction.  Gated ejection fraction 63% with normal wall motion.  EKG:    EKG Interpretation Date/Time:  Tuesday August 27 2023 15:05:33 EDT Ventricular Rate:  72 PR Interval:  160 QRS Duration:  94  QT Interval:  376 QTC Calculation: 411 R Axis:   -10  Text Interpretation: EKG 08/27/2023: Normal sinus rhythm at the rate of 72 bpm, incomplete right bundle branch block.  Borderline criteria for LVH.  Compared to 07/29/2023, no significant change. Confirmed by Delrae Rend [phone]) on 08/27/2023 3:08:36 PM    Medications and allergies    Allergies  Allergen Reactions   Flexeril [Cyclobenzaprine Hcl] Other (See Comments)    Causes him to pass out and lowers his BP   Lidocaine Swelling    throat   Novocain [Procaine Hcl] Swelling     Current Outpatient Medications:    acetaminophen (TYLENOL) 500 MG tablet, Take 500-1,000 mg by mouth every 6 (six) hours as needed for headache., Disp: , Rfl:    amitriptyline (ELAVIL) 10 MG tablet, Take 1 tablet (10 mg total) by mouth at bedtime., Disp: 30 tablet, Rfl: 11   amLODipine (NORVASC) 2.5 MG tablet, Take 1 tablet (2.5 mg total) by mouth daily., Disp: 30 tablet, Rfl: 2   aspirin EC 81 MG EC tablet, Take 1 tablet (81 mg total) by mouth daily. Swallow whole., Disp: 30 tablet, Rfl: 11   Cholecalciferol (VITAMIN D) 50 MCG (2000 UT) tablet, Take 2,000 Units by mouth daily., Disp: , Rfl:    cycloSPORINE (RESTASIS) 0.05 % ophthalmic emulsion, Place 1 drop into both eyes 2 (two) times daily., Disp: , Rfl:    empagliflozin (JARDIANCE) 10 MG TABS tablet, Take 10 mg by mouth daily., Disp: , Rfl:    furosemide (LASIX) 20 MG tablet, Take 0.5 tablets (10 mg total) by mouth daily as needed for edema (Leg swelling)., Disp: 30 tablet, Rfl: 1   Glucagon 1 MG/0.2ML SOAJ, Inject 1 mg into the skin as needed (low blood sugar)., Disp: 0.2 mL, Rfl: 0   glucose blood (ONETOUCH VERIO) test strip, 1 each by  Other route 4 (four) times daily -  before meals and at bedtime. dexcom 6, Disp: , Rfl:    insulin aspart (NOVOLOG FLEXPEN) 100 UNIT/ML FlexPen, Inject 0-20 Units into the skin See admin instructions. Sliding scale per endocrinologist. (3 times with each meal) Less than 100 = 0  100 -150 = 5 units  150-200 = 8 units   200-250 =12 units, Disp: 15 mL, Rfl: 11   insulin glargine (LANTUS) 100 UNIT/ML injection, Inject 0.1 mLs (10 Units total) into the skin 2 (two) times daily. (Patient taking differently: Inject 25-30 Units into the skin See admin instructions. Take 30 units sq in the morning and then take 25 units at night), Disp: , Rfl:    Insulin Syringe-Needle U-100 (B-D INS SYR ULTRAFINE 1CC/31G) 31G X 5/16" 1 ML MISC, Used to inject insulin twice daily., Disp: 90 each, Rfl: 2   levothyroxine (SYNTHROID) 50 MCG tablet, Take 50 mcg by mouth daily before breakfast., Disp: , Rfl:    Magnesium Oxide 420 MG TABS, Take 1 tablet by mouth 2 (two) times daily., Disp: , Rfl:    Multiple Vitamins-Minerals (ICAPS AREDS 2 PO), Take 1 capsule by mouth in the morning and at bedtime., Disp: , Rfl:    nitroGLYCERIN (NITROSTAT) 0.4 MG SL tablet, Place 1 tablet (0.4 mg total) under the tongue every 5 (five) minutes as needed for chest pain., Disp: 25 tablet, Rfl: 3   pantoprazole (PROTONIX) 40 MG tablet, Take 1 tablet (40 mg total) by mouth daily., Disp: , Rfl:    rosuvastatin (CRESTOR) 40 MG tablet, TAKE 1 TABLET BY MOUTH DAILY (Patient taking differently: Take 40  mg by mouth every evening.), Disp: 90 tablet, Rfl: 0   Semaglutide, 1 MG/DOSE, (OZEMPIC, 1 MG/DOSE,) 2 MG/1.5ML SOPN, Inject 2 mg into the skin once a week., Disp: , Rfl:    sodium bicarbonate 650 MG tablet, Take 650 mg by mouth 2 (two) times daily., Disp: , Rfl:    tamsulosin (FLOMAX) 0.4 MG CAPS capsule, Take 1 capsule (0.4 mg total) by mouth daily., Disp: 30 capsule, Rfl: 0   topiramate (TOPAMAX) 100 MG tablet, Take 1 tablet (100 mg total) by mouth 2 (two)  times daily., Disp: 60 tablet, Rfl: 5   ASSESSMENT AND PLAN: .      ICD-10-CM   1. Stable angina pectoris (HCC)  I20.89 EKG 12-Lead    2. Dyspnea on exertion  R06.09     3. Primary hypertension  I10     4. Type 2 diabetes mellitus with stage 3b chronic kidney disease, without long-term current use of insulin (HCC)  E11.22    N18.32     5. Pre-procedure lab exam  [phone] Basic metabolic panel    CBC      No orders of the defined types were placed in this encounter.   There are no discontinued medications.   Assessment and Plan    Shortness of breath   He experiences worsening shortness of breath since January 2025, significantly impacting daily activities. Initial evaluations, including echocardiogram and stress test, were normal. A CT scan ruled out pulmonary embolism and lung scarring. Differential diagnosis includes cardiac causes, pulmonary issues, or deconditioning due to diabetic peripheral neuropathy.  Pulmonary etiology was felt to be very low as per pulmonary evaluation.  PFTs are pending but preliminary report says normal study.  He has no COPD or history of smoking in the past.  Despite normal stress test results, undetected coronary artery disease is suspected in view of nearly class III-IV dyspnea and significant change in his lifestyle since December 2024. Right and left heart catheterization is planned to assess for coronary artery blockages. The procedure carries risks, including less than 1% risk of death, stroke, myocardial infarction, or urgent bypass, and a 1 in 1000 risk of cardiac arrest. Renal function will be monitored closely due to potential nephrotoxic effects of contrast dye. He should consume plenty of beets two days prior to the procedure to protect renal function. If blockages are found, consider stenting or discuss the need for bypass surgery.  Chronic kidney disease   Underlying renal issues require caution with contrast dye during cardiac procedures.  The amount of x-ray dye will be limited to minimize risk to renal function.  Acute renal failure and need for dialysis risk is <5%.  Primary hypertension Blood pressure was soft when I last saw him, fortunately is tolerating amlodipine 2.5 mg daily.  Not much room to wiggle with regard to anginal symptom management hence proceeding with cardiac catheterization is appropriate as well.  Hypotension leading to worsening renal function is also possibility.  Hence no ACE inhibitors or ARB at this time but will consider this in future.  Diabetic peripheral neuropathy   He shows signs of peripheral neuropathy, contributing to instability and leg weakness, possibly exacerbating deconditioning and shortness of breath.  Diabetes mellitus   Diabetes is well-controlled with an A1c of 6.7%.  Hyperlipidemia   Cholesterol levels are well-controlled with an LDL of 41.  Follow-up   Schedule a follow-up appointment in one to two months post-heart catheterization. Labs reviewed for precath eval, BMP, renal function stable and CBC  stable to proceed       Signed,  Yates Decamp, MD, [facility] 08/28/2023, 6:20 AM [facility] [address] [place], [place] [address] Phone: [phone]. Fax:  [phone]

## 2023-08-28 ENCOUNTER — Encounter: Payer: Self-pay | Admitting: Cardiology

## 2023-08-28 LAB — CBC
Hematocrit: 48.1 % (ref 37.5–51.0)
Hemoglobin: 15.9 g/dL (ref 13.0–17.7)
MCH: 28.3 pg (ref 26.6–33.0)
MCHC: 33.1 g/dL (ref 31.5–35.7)
MCV: 86 fL (ref 79–97)
Platelets: 199 10*3/uL (ref 150–450)
RBC: 5.61 x10E6/uL (ref 4.14–5.80)
RDW: 13.2 % (ref 11.6–15.4)
WBC: 7 10*3/uL (ref 3.4–10.8)

## 2023-08-28 LAB — BASIC METABOLIC PANEL
BUN/Creatinine Ratio: 12 (ref 10–24)
BUN: 19 mg/dL (ref 8–27)
CO2: 22 mmol/L (ref 20–29)
Calcium: 9.1 mg/dL (ref 8.6–10.2)
Chloride: 104 mmol/L (ref 96–106)
Creatinine, Ser: 1.56 mg/dL — ABNORMAL HIGH (ref 0.76–1.27)
Glucose: 136 mg/dL — ABNORMAL HIGH (ref 70–99)
Potassium: 3.7 mmol/L (ref 3.5–5.2)
Sodium: 140 mmol/L (ref 134–144)
eGFR: 46 mL/min/{1.73_m2} — ABNORMAL LOW (ref >59)

## 2023-08-28 NOTE — Progress Notes (Signed)
 Okay thank you

## 2023-08-28 NOTE — Progress Notes (Signed)
 Labs reviewed for precath eval, BMP, renal function stable and CBC stable to proceed

## 2023-08-29 ENCOUNTER — Other Ambulatory Visit: Payer: Self-pay | Admitting: Cardiology

## 2023-08-29 ENCOUNTER — Ambulatory Visit: Attending: Adult Health | Admitting: Physical Therapy

## 2023-09-03 ENCOUNTER — Encounter (INDEPENDENT_AMBULATORY_CARE_PROVIDER_SITE_OTHER): Payer: Medicare PPO | Admitting: Ophthalmology

## 2023-09-04 ENCOUNTER — Encounter: Payer: Self-pay | Admitting: Internal Medicine

## 2023-09-10 ENCOUNTER — Telehealth: Payer: Self-pay | Admitting: *Deleted

## 2023-09-10 NOTE — Telephone Encounter (Signed)
 Cardiac Catheterization scheduled at [facility] for: Wednesday September 11, 2023 10 AM Arrival time [facility] Main Entrance A at: 8 AM  Nothing to eat after midnight prior to procedure, clear liquids until 5 AM day of procedure.  Medication instructions: -Hold:  Jardiance-AM of procedure  Insulin-AM of procedure-1/2 usual Insulin dose HS prior to procedure  Lasix-day before and day of procedure-per protocol GFR < 60 (46)  Semaglutide-weekly on Mondays-last dose 09/02/23 -Other usual morning medications can be taken with sips of water including aspirin 81 mg.  Plan to go home the same day, you will only stay overnight if medically necessary.  You must have responsible adult to drive you home.  Someone must be with you the first 24 hours after you arrive home.  Reviewed procedure instructions with patient's wife (DPR).

## 2023-09-11 ENCOUNTER — Encounter (HOSPITAL_COMMUNITY)
Admission: RE | Disposition: A | Discharge status: Home / Self Care | Payer: Self-pay | Source: Home / Self Care | Attending: Cardiology

## 2023-09-11 ENCOUNTER — Other Ambulatory Visit: Payer: Self-pay

## 2023-09-11 ENCOUNTER — Ambulatory Visit (HOSPITAL_COMMUNITY)
Admission: RE | Admit: 2023-09-11 | Discharge: 2023-09-11 | Disposition: A | Discharge status: Home / Self Care | Attending: Cardiology | Admitting: Cardiology

## 2023-09-11 ENCOUNTER — Other Ambulatory Visit (HOSPITAL_COMMUNITY): Payer: Self-pay

## 2023-09-11 DIAGNOSIS — E1122 Type 2 diabetes mellitus with diabetic chronic kidney disease: Secondary | ICD-10-CM | POA: Insufficient documentation

## 2023-09-11 DIAGNOSIS — Z794 Long term (current) use of insulin: Secondary | ICD-10-CM | POA: Diagnosis not present

## 2023-09-11 DIAGNOSIS — R079 Chest pain, unspecified: Secondary | ICD-10-CM | POA: Diagnosis present

## 2023-09-11 DIAGNOSIS — I2510 Atherosclerotic heart disease of native coronary artery without angina pectoris: Secondary | ICD-10-CM

## 2023-09-11 DIAGNOSIS — Z955 Presence of coronary angioplasty implant and graft: Secondary | ICD-10-CM

## 2023-09-11 DIAGNOSIS — I25110 Atherosclerotic heart disease of native coronary artery with unstable angina pectoris: Secondary | ICD-10-CM | POA: Insufficient documentation

## 2023-09-11 DIAGNOSIS — Z7984 Long term (current) use of oral hypoglycemic drugs: Secondary | ICD-10-CM | POA: Insufficient documentation

## 2023-09-11 DIAGNOSIS — Z7985 Long-term (current) use of injectable non-insulin antidiabetic drugs: Secondary | ICD-10-CM | POA: Insufficient documentation

## 2023-09-11 DIAGNOSIS — E7800 Pure hypercholesterolemia, unspecified: Secondary | ICD-10-CM | POA: Diagnosis not present

## 2023-09-11 DIAGNOSIS — I129 Hypertensive chronic kidney disease with stage 1 through stage 4 chronic kidney disease, or unspecified chronic kidney disease: Secondary | ICD-10-CM | POA: Diagnosis not present

## 2023-09-11 DIAGNOSIS — N1832 Chronic kidney disease, stage 3b: Secondary | ICD-10-CM | POA: Insufficient documentation

## 2023-09-11 HISTORY — PX: RIGHT/LEFT HEART CATH AND CORONARY ANGIOGRAPHY: CATH118266

## 2023-09-11 HISTORY — PX: CORONARY LITHOTRIPSY: CATH118330

## 2023-09-11 HISTORY — PX: CORONARY STENT INTERVENTION: CATH118234

## 2023-09-11 LAB — POCT I-STAT EG7
Acid-base deficit: 6 mmol/L — ABNORMAL HIGH (ref 0.0–2.0)
Acid-base deficit: 6 mmol/L — ABNORMAL HIGH (ref 0.0–2.0)
Acid-base deficit: 6 mmol/L — ABNORMAL HIGH (ref 0.0–2.0)
Acid-base deficit: 7 mmol/L — ABNORMAL HIGH (ref 0.0–2.0)
Bicarbonate: 18.9 mmol/L — ABNORMAL LOW (ref 20.0–28.0)
Bicarbonate: 19.7 mmol/L — ABNORMAL LOW (ref 20.0–28.0)
Bicarbonate: 19.8 mmol/L — ABNORMAL LOW (ref 20.0–28.0)
Bicarbonate: 19.9 mmol/L — ABNORMAL LOW (ref 20.0–28.0)
Calcium, Ion: 1.11 mmol/L — ABNORMAL LOW (ref 1.15–1.40)
Calcium, Ion: 1.24 mmol/L (ref 1.15–1.40)
Calcium, Ion: 1.25 mmol/L (ref 1.15–1.40)
Calcium, Ion: 1.25 mmol/L (ref 1.15–1.40)
HCT: 41 % (ref 39.0–52.0)
HCT: 43 % (ref 39.0–52.0)
HCT: 43 % (ref 39.0–52.0)
HCT: 44 % (ref 39.0–52.0)
Hemoglobin: 13.9 g/dL (ref 13.0–17.0)
Hemoglobin: 14.6 g/dL (ref 13.0–17.0)
Hemoglobin: 14.6 g/dL (ref 13.0–17.0)
Hemoglobin: 15 g/dL (ref 13.0–17.0)
O2 Saturation: 68 %
O2 Saturation: 69 %
O2 Saturation: 70 %
O2 Saturation: 72 %
Potassium: 3.6 mmol/L (ref 3.5–5.1)
Potassium: 3.9 mmol/L (ref 3.5–5.1)
Potassium: 3.9 mmol/L (ref 3.5–5.1)
Potassium: 4 mmol/L (ref 3.5–5.1)
Sodium: 140 mmol/L (ref 135–145)
Sodium: 141 mmol/L (ref 135–145)
Sodium: 141 mmol/L (ref 135–145)
Sodium: 142 mmol/L (ref 135–145)
TCO2: 20 mmol/L — ABNORMAL LOW (ref 22–32)
TCO2: 21 mmol/L — ABNORMAL LOW (ref 22–32)
TCO2: 21 mmol/L — ABNORMAL LOW (ref 22–32)
TCO2: 21 mmol/L — ABNORMAL LOW (ref 22–32)
pCO2, Ven: 38.6 mmHg — ABNORMAL LOW (ref 44–60)
pCO2, Ven: 39 mmHg — ABNORMAL LOW (ref 44–60)
pCO2, Ven: 39.3 mmHg — ABNORMAL LOW (ref 44–60)
pCO2, Ven: 40.3 mmHg — ABNORMAL LOW (ref 44–60)
pH, Ven: 7.299 (ref 7.25–7.43)
pH, Ven: 7.302 (ref 7.25–7.43)
pH, Ven: 7.311 (ref 7.25–7.43)
pH, Ven: 7.311 (ref 7.25–7.43)
pO2, Ven: 39 mmHg (ref 32–45)
pO2, Ven: 39 mmHg (ref 32–45)
pO2, Ven: 40 mmHg (ref 32–45)
pO2, Ven: 42 mmHg (ref 32–45)

## 2023-09-11 LAB — GLUCOSE, CAPILLARY
Glucose-Capillary: 237 mg/dL — ABNORMAL HIGH (ref 70–99)
Glucose-Capillary: 204 mg/dL — ABNORMAL HIGH (ref 70–99)
Glucose-Capillary: 204 mg/dL — ABNORMAL HIGH (ref 70–99)

## 2023-09-11 LAB — POCT I-STAT 7, (LYTES, BLD GAS, ICA,H+H)
Acid-base deficit: 7 mmol/L — ABNORMAL HIGH (ref 0.0–2.0)
Bicarbonate: 18.4 mmol/L — ABNORMAL LOW (ref 20.0–28.0)
Calcium, Ion: 1.25 mmol/L (ref 1.15–1.40)
HCT: 43 % (ref 39.0–52.0)
Hemoglobin: 14.6 g/dL (ref 13.0–17.0)
O2 Saturation: 92 %
Potassium: 3.9 mmol/L (ref 3.5–5.1)
Sodium: 140 mmol/L (ref 135–145)
TCO2: 19 mmol/L — ABNORMAL LOW (ref 22–32)
pCO2 arterial: 36.5 mmHg (ref 32–48)
pH, Arterial: 7.309 — ABNORMAL LOW (ref 7.35–7.45)
pO2, Arterial: 69 mmHg — ABNORMAL LOW (ref 83–108)

## 2023-09-11 LAB — POCT ACTIVATED CLOTTING TIME
Activated Clotting Time: 308 s
Activated Clotting Time: 274 s
Activated Clotting Time: 354 s

## 2023-09-11 SURGERY — RIGHT/LEFT HEART CATH AND CORONARY ANGIOGRAPHY
Anesthesia: LOCAL

## 2023-09-11 MED ORDER — SODIUM CHLORIDE 0.9% FLUSH: 3.0000 mL | Freq: Two times a day (BID) | INTRAVENOUS | Status: DC

## 2023-09-11 MED ORDER — ACETAMINOPHEN 325 MG PO TABS: 650.0000 mg | ORAL_TABLET | ORAL | Status: DC | PRN

## 2023-09-11 MED ORDER — VERAPAMIL HCL 2.5 MG/ML IV SOLN
INTRAVENOUS | Status: DC | PRN
  Administered 2023-09-11: 6 mL via INTRA_ARTERIAL

## 2023-09-11 MED ORDER — HEPARIN SODIUM (PORCINE) 1000 UNIT/ML IJ SOLN
INTRAMUSCULAR | Status: AC
  Filled 2023-09-11: qty 10

## 2023-09-11 MED ORDER — SODIUM CHLORIDE 0.9 % WEIGHT BASED INFUSION
1.0000 mL/kg/h | INTRAVENOUS | Status: DC
  Administered 2023-09-11: 1 mL/kg/h via INTRAVENOUS

## 2023-09-11 MED ORDER — ONDANSETRON HCL 4 MG/2ML IJ SOLN: 4.0000 mg | Freq: Four times a day (QID) | INTRAMUSCULAR | Status: DC | PRN

## 2023-09-11 MED ORDER — NITROGLYCERIN 1 MG/10 ML FOR IR/CATH LAB
INTRA_ARTERIAL | Status: DC | PRN
  Administered 2023-09-11: 200 ug via INTRACORONARY

## 2023-09-11 MED ORDER — TETRACAINE HCL 1 % IJ SOLN
5.0000 mg | Freq: Once | INTRAMUSCULAR | Status: DC
  Filled 2023-09-11: qty 2

## 2023-09-11 MED ORDER — SODIUM CHLORIDE 0.9 % WEIGHT BASED INFUSION: 1.0000 mL/kg/h | INTRAVENOUS | Status: AC

## 2023-09-11 MED ORDER — FENTANYL CITRATE (PF) 100 MCG/2ML IJ SOLN
INTRAMUSCULAR | Status: DC | PRN
  Administered 2023-09-11 (×2): 50 ug via INTRAVENOUS
  Administered 2023-09-11 (×2): 25 ug via INTRAVENOUS

## 2023-09-11 MED ORDER — NITROGLYCERIN 1 MG/10 ML FOR IR/CATH LAB
INTRA_ARTERIAL | Status: AC
  Filled 2023-09-11: qty 10

## 2023-09-11 MED ORDER — CLOPIDOGREL BISULFATE 75 MG PO TABS
75.0000 mg | ORAL_TABLET | Freq: Every day | ORAL | 0 refills | Status: DC
  Filled 2023-09-11: qty 30, 30d supply, fill #0

## 2023-09-11 MED ORDER — TETRACAINE HCL 1 % IJ SOLN
INTRAMUSCULAR | Status: DC | PRN
  Administered 2023-09-11: 5 mg

## 2023-09-11 MED ORDER — CLOPIDOGREL BISULFATE 300 MG PO TABS
ORAL_TABLET | ORAL | Status: DC | PRN
  Administered 2023-09-11: 600 mg via ORAL

## 2023-09-11 MED ORDER — SODIUM CHLORIDE 0.9 % WEIGHT BASED INFUSION
3.0000 mL/kg/h | INTRAVENOUS | Status: AC
  Administered 2023-09-11: 3 mL/kg/h via INTRAVENOUS

## 2023-09-11 MED ORDER — VERAPAMIL HCL 2.5 MG/ML IV SOLN
INTRAVENOUS | Status: AC
  Filled 2023-09-11: qty 2

## 2023-09-11 MED ORDER — SODIUM CHLORIDE 0.9% FLUSH: 3.0000 mL | INTRAVENOUS | Status: DC | PRN

## 2023-09-11 MED ORDER — FENTANYL CITRATE (PF) 100 MCG/2ML IJ SOLN
INTRAMUSCULAR | Status: AC
  Filled 2023-09-11: qty 2

## 2023-09-11 MED ORDER — MIDAZOLAM HCL 2 MG/2ML IJ SOLN
INTRAMUSCULAR | Status: AC
  Filled 2023-09-11: qty 2

## 2023-09-11 MED ORDER — HEPARIN SODIUM (PORCINE) 1000 UNIT/ML IJ SOLN
INTRAMUSCULAR | Status: DC | PRN
  Administered 2023-09-11: 5000 [IU] via INTRAVENOUS
  Administered 2023-09-11: 3000 [IU] via INTRAVENOUS
  Administered 2023-09-11: 4000 [IU] via INTRAVENOUS

## 2023-09-11 MED ORDER — IOHEXOL 350 MG/ML SOLN
INTRAVENOUS | Status: DC | PRN
  Administered 2023-09-11: 135 mL

## 2023-09-11 MED ORDER — HEPARIN (PORCINE) IN NACL 1000-0.9 UT/500ML-% IV SOLN
INTRAVENOUS | Status: DC | PRN
  Administered 2023-09-11 (×2): 500 mL

## 2023-09-11 MED ORDER — SODIUM CHLORIDE 0.9 % IV SOLN: 250.0000 mL | INTRAVENOUS | Status: DC | PRN

## 2023-09-11 MED ORDER — CLOPIDOGREL BISULFATE 300 MG PO TABS
ORAL_TABLET | ORAL | Status: AC
  Filled 2023-09-11: qty 2

## 2023-09-11 MED ORDER — ASPIRIN 81 MG PO CHEW: 81.0000 mg | CHEWABLE_TABLET | ORAL | Status: DC

## 2023-09-11 MED ORDER — MIDAZOLAM HCL 2 MG/2ML IJ SOLN
INTRAMUSCULAR | Status: DC | PRN
  Administered 2023-09-11: 2 mg via INTRAVENOUS

## 2023-09-11 SURGICAL SUPPLY — 26 items
BALL SAPPHIRE NC24 3.0X18 (BALLOONS) ×1 IMPLANT
BALLN EMERGE MR 3.0X20 (BALLOONS) ×1 IMPLANT
BALLN ~~LOC~~ EMERGE MR 3.5X20 (BALLOONS) ×1 IMPLANT
BALLOON EMERGE MR 3.0X20 (BALLOONS) IMPLANT
BALLOON SAPPHIRE NC24 3.0X18 (BALLOONS) IMPLANT
BALLOON ~~LOC~~ EMERGE MR 3.5X20 (BALLOONS) IMPLANT
CATH BALLN WEDGE 5F 110CM (CATHETERS) IMPLANT
CATH GUIDELINER COAST (CATHETERS) IMPLANT
CATH INFINITI AMBI 5FR TG (CATHETERS) IMPLANT
CATH SHOCKWAVE C2 4.0X12 (CATHETERS) IMPLANT
CATH VISTA GUIDE 6FR XB3.5 EPK (CATHETERS) IMPLANT
DEVICE RAD COMP TR BAND LRG (VASCULAR PRODUCTS) IMPLANT
ELECT DEFIB PAD ADLT CADENCE (PAD) IMPLANT
GLIDESHEATH SLEND A-KIT 6F 22G (SHEATH) IMPLANT
GUIDEWIRE INQWIRE 1.5J.035X260 (WIRE) IMPLANT
INQWIRE 1.5J .035X260CM (WIRE) ×1 IMPLANT
KIT ENCORE 26 ADVANTAGE (KITS) IMPLANT
KIT HEMO VALVE WATCHDOG (MISCELLANEOUS) IMPLANT
PACK CARDIAC CATHETERIZATION (CUSTOM PROCEDURE TRAY) ×1 IMPLANT
SET ATX-X65L (MISCELLANEOUS) IMPLANT
SHEATH GLIDE SLENDER 4/5FR (SHEATH) IMPLANT
STENT SYNERGY XD 3.0X16 (Permanent Stent) IMPLANT
STENT SYNERGY XD 3.50X28 (Permanent Stent) IMPLANT
STENT SYS SYNERGY XD 3.0X16 (Permanent Stent) ×1 IMPLANT
SYNERGY XD 3.50X28 (Permanent Stent) ×1 IMPLANT
WIRE RUNTHROUGH .014X180CM (WIRE) IMPLANT

## 2023-09-11 NOTE — Progress Notes (Signed)
 Notified by nursing staff that patient had episode of chest pain this evening that resolved without intervention. Patient states around 6:25 he had felt a sensation across his chest which he described as mild, but with slight increase at one point to 6/10, that was on the left side that felt like indigestion. He had no associated SOB, n/v, diaphoresis. He had not had chest pain before cath (symptoms were more dyspnea, shoulder discomfort). This lasted maximum 10 minutes and resolved without intervention. EKG shows NSR without acute STT changes. VSS. Cath site is stable without complication. Patient ambulated earlier without complication and I also walked him again just now. He was able to ambulate two lengths of hallways and back without any angina or dyspnea. He remarks that before the stenting he could hardly walk a few feet without SOB and that is now gone. Discussed with Dr. Royann Shivers, [place] for DC. ER precautions relayed.

## 2023-09-11 NOTE — Progress Notes (Signed)
 Discussed with pt and wife stent, restrictions, Plavix, diet, exercise, NTG and CRPII. Pt receptive, very happy with results so far. Will refer to [place] CRPII.  [id number] Ethelda Chick BS, ACSM-CEP 09/11/2023 2:44 PM

## 2023-09-11 NOTE — Discharge Instructions (Signed)

## 2023-09-11 NOTE — Interval H&P Note (Signed)
 History and Physical Interval Note:  09/11/2023 9:29 AM  Jeff [name]  has presented today for surgery, with the diagnosis of chest pain.  The various methods of treatment have been discussed with the patient and family. After consideration of risks, benefits and other options for treatment, the patient has consented to  Procedure(s): RIGHT/LEFT HEART CATH AND CORONARY ANGIOGRAPHY (N/A) and possible coronary angioplasty as a surgical intervention.  The patient's history has been reviewed, patient examined, no change in status, stable for surgery.  I have reviewed the patient's chart and labs.  Questions were answered to the patient's satisfaction.   Cath Lab Visit (complete for each Cath Lab visit)  Clinical Evaluation Leading to the Procedure:   ACS: No.  Non-ACS:    Anginal Classification: CCS III  Anti-ischemic medical therapy: Minimal Therapy (1 class of medications)  Non-Invasive Test Results: Low-risk stress test findings: cardiac mortality <1%/year  Prior CABG: No previous CABG  Yates Decamp

## 2023-09-11 NOTE — Discharge Summary (Signed)
 Discharge Summary for Same Day PCI   Patient ID: Jeff [name] MRN: [id number]; DOB: September 21, 1950  Admit date: 09/11/2023 Discharge date: 09/11/2023  Primary Care Provider: [facility]  Primary Cardiologist: Yates Decamp, MD  Primary Electrophysiologist:  None   Discharge Diagnoses    Active Problems:   S/P primary angioplasty with coronary stent    Diagnostic Studies/Procedures    Right and Left Heart Catheterization 09/11/2023:  Right + Cardiac Catheterization 09/11/23: Hemodynamic data: RA 15/13, mean 12 mmHg, RA saturation 70%. RV 37/10, EDP 16 mmHg. PA 32/23, mean 28 mmHg.  PA saturation 69%. PW 24/22, mean 22 mmHg.  Aortic saturation 92%. QP/QS 0.96, no significant right-to-left shunting.  CO 5.47, CI 2.8.  Normal.  Mild pulm hypertension secondary to elevated EDP.   LVEDP 29 mmHg.  No pressure gradient across the aortic valve.   Angiographic data: LM: Large-caliber vessel, smooth and normal. LAD: Moderate calcification in the proximal to mid segment and a proximal to mid 90% stenosis.  Large D1 with proximal 60 to 70% stenosis.  LAD at the apex has a focal 90% stenosis.  Mild disease otherwise. RI: Moderate caliber vessel, mild disease. CX: Dominant.  Gives origin to large OM1 and a large distribution but moderate-sized OM 2.  There is a focal 90% stenosis in the mid OM1.  Proximal OM 2 has diffuse 80 to 85% stenosis and is a 2.5 mm vessel. RCA: Nondominant with a proximal 80 to 90% stenosis.   Intervention data: Successful lithotripsy with 4.0 x 12 mm shockwave balloon followed by stenting with a 3.5 x 28 mm Synergy XD DES into the proximal and mid LAD, stenosis reduced from 95% to 0% with TIMI-3 to TIMI-3 flow. Successful PTCA and stenting of the mid OM1 with 3.0 x 16 mm Synergy XD DES, stenosis reduced from 90% to 0% with TIMI-3 to TIMI-3 flow.      Impression and recommendations: He has OM 2 which is fairly large distribution and a 2.5 mm vessel, if he has  recurrence of angina could consider continue him back for angioplasty.  In view of renal insufficiency, did not proceed with angioplasty of OM 2 which is relatively a small vessel and can be treated medically.  Home today after hydration. _____________   History of Present Illness     Jeff [name] is a 73 y.o. 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 Patient is followed by Dr. Jacinto Halim  Patient was seen in the ED on 07/19/23 with substernal chest pain and dyspnea on exertion.  He continued to complain of exertional neck pain associated with dyspnea when he was seen by DR. Ganji on 07/29/2023. He underwent nuclear stress test 08/09/23 that was a normal study without ischemia or infarction. He was started on amlodipine 2.5 mg daily. He was seen by Dr. Jacinto Halim again in clinic on 08/27/23. At that time, he continued to complain of shortness of breath. A right and left heart catheterization was arranged for further evaluation.  Hospital Course     The patient underwent cardiac cath as noted above with successful lithotripsy with 4.0 x 12 mm shockwave balloon followed by stenting with a 3.5 x 28 mm Synergy XD DES into the proximal and mid LAD. Also underwent successful PTCA and stenting of the mid OM with DES. Plan for DAPT with ASA/plavix for at  least 6 months. The patient was seen by cardiac rehab while in short stay. There were no observed complications post cath.   PM call PA, Dayna Dunn, reevaluated patient's radial cath site prior to DC. See her note for details. Instructions/precautions regarding cath site care were given prior to discharge.  Jeff [name] was seen by Dr. Jacinto Halim and determined stable for discharge home. Follow up with our office has been arranged. Medications are listed below. Pertinent changes include addition of plavix 75 mg  daily.   _____________  Cath/PCI Registry Performance & Quality Measures: Aspirin prescribed? - Yes ADP Receptor Inhibitor (Plavix/Clopidogrel, Brilinta/Ticagrelor or Effient/Prasugrel) prescribed (includes medically managed patients)? - Yes High Intensity Statin (Lipitor 40-80mg  or Crestor 20-40mg ) prescribed? - Yes For EF <40%, was ACEI/ARB prescribed? - No - Outpatient Echocardiogram to assess EF will be scheduled. For EF <40%, Aldosterone Antagonist (Spironolactone or Eplerenone) prescribed? - No - Outpatient Echocardiogram to assess EF will be scheduled. Cardiac Rehab Phase II ordered (Included Medically managed Patients)? - Yes  _____________   Discharge Vitals Blood pressure (!) 156/75, pulse 69, temperature 98.9 F (37.2 C), temperature source Oral, resp. rate 16, height 5\' 8"  (1.727 m), weight 82.1 kg, SpO2 96%.  Filed Weights   09/11/23 0822  Weight: 82.1 kg    Last Labs & Radiologic Studies    CBC No results for input(s): "WBC", "NEUTROABS", "HGB", "HCT", "MCV", "PLT" in the last 72 hours. Basic Metabolic Panel No results for input(s): "NA", "K", "CL", "CO2", "GLUCOSE", "BUN", "CREATININE", "CALCIUM", "MG", "PHOS" in the last 72 hours. Liver Function Tests No results for input(s): "AST", "ALT", "ALKPHOS", "BILITOT", "PROT", "ALBUMIN" in the last 72 hours. No results for input(s): "LIPASE", "AMYLASE" in the last 72 hours. High Sensitivity Troponin:   No results for input(s): "TROPONINIHS" in the last 720 hours.  BNP Invalid input(s): "POCBNP" D-Dimer No results for input(s): "DDIMER" in the last 72 hours. Hemoglobin A1C No results for input(s): "HGBA1C" in the last 72 hours. Fasting Lipid Panel No results for input(s): "CHOL", "HDL", "LDLCALC", "TRIG", "CHOLHDL", "LDLDIRECT" in the last 72 hours. Thyroid Function Tests No results for input(s): "TSH", "T4TOTAL", "T3FREE", "THYROIDAB" in the last 72 hours.  Invalid input(s): "FREET3" _____________  CARDIAC  CATHETERIZATION Result Date: 09/11/2023 Images from the original result were not included. Right + Cardiac Catheterization 09/11/23: Hemodynamic data: RA 15/13, mean 12 mmHg, RA saturation 70%. RV 37/10, EDP 16 mmHg. PA 32/23, mean 28 mmHg.  PA saturation 69%. PW 24/22, mean 22 mmHg.  Aortic saturation 92%. QP/QS 0.96, no significant right-to-left shunting.  CO 5.47, CI 2.8.  Normal.  Mild pulm hypertension secondary to elevated EDP. LVEDP 29 mmHg.  No pressure gradient across the aortic valve. Angiographic data: LM: Large-caliber vessel, smooth and normal. LAD: Moderate calcification in the proximal to mid segment and a proximal to mid 90% stenosis.  Large D1 with proximal 60 to 70% stenosis.  LAD at the apex has a focal 90% stenosis.  Mild disease otherwise. RI: Moderate caliber vessel, mild disease. CX: Dominant.  Gives origin to large OM1 and a large distribution but moderate-sized OM 2.  There is a focal 90% stenosis in the mid OM1.  Proximal OM 2 has diffuse 80 to 85% stenosis and is a 2.5 mm vessel. RCA: Nondominant with a proximal 80 to 90% stenosis. Intervention data: Successful lithotripsy with 4.0 x 12 mm shockwave balloon followed by stenting with a 3.5 x 28 mm Synergy XD DES into the proximal and mid LAD, stenosis reduced  from 95% to 0% with TIMI-3 to TIMI-3 flow. Successful PTCA and stenting of the mid OM1 with 3.0 x 16 mm Synergy XD DES, stenosis reduced from 90% to 0% with TIMI-3 to TIMI-3 flow. Impression and recommendations: He has OM 2 which is fairly large distribution and a 2.5 mm vessel, if he has recurrence of angina could consider continue him back for angioplasty.  In view of renal insufficiency, did not proceed with angioplasty of OM 2 which is relatively a small vessel and can be treated medically.  Home today after hydration.    Disposition   Pt is being discharged home today in good condition.  Follow-up Plans & Appointments     Discharge Instructions     AMB Referral to  Cardiac Rehabilitation - Phase II   Complete by: As directed    Diagnosis: Coronary Stents   After initial evaluation and assessments completed: Virtual Based Care may be provided alone or in conjunction with Phase 2 Cardiac Rehab based on patient barriers.: Yes   Intensive Cardiac Rehabilitation (ICR) [facility] location only OR Traditional Cardiac Rehabilitation (TCR) *If criteria for ICR are not met will enroll in TCR [facility] only): Yes        Discharge Medications   Allergies as of 09/11/2023       Reactions   Flexeril [cyclobenzaprine Hcl] Other (See Comments)   Causes him to pass out and lowers his BP   Lidocaine Swelling   throat   Novocain [procaine Hcl] Swelling        Medication List     TAKE these medications    acetaminophen 500 MG tablet Commonly known as: TYLENOL Take 500-1,000 mg by mouth every 6 (six) hours as needed for headache.   amitriptyline 10 MG tablet Commonly known as: ELAVIL Take 1 tablet (10 mg total) by mouth at bedtime.   amLODipine 2.5 MG tablet Commonly known as: NORVASC Take 1 tablet (2.5 mg total) by mouth daily.   aspirin EC 81 MG tablet Take 1 tablet (81 mg total) by mouth daily. Swallow whole.   clopidogrel 75 MG tablet Commonly known as: Plavix Take 1 tablet (75 mg total) by mouth daily.   cycloSPORINE 0.05 % ophthalmic emulsion Commonly known as: RESTASIS Place 1 drop into both eyes 2 (two) times daily.   empagliflozin 10 MG Tabs tablet Commonly known as: JARDIANCE Take 10 mg by mouth daily.   furosemide 20 MG tablet Commonly known as: LASIX Take 0.5 tablets (10 mg total) by mouth daily as needed for edema (Leg swelling).   Glucagon 1 MG/0.2ML Soaj Inject 1 mg into the skin as needed (low blood sugar).   ICAPS AREDS 2 PO Take 1 capsule by mouth in the morning and at bedtime.   insulin glargine 100 UNIT/ML injection Commonly known as: LANTUS Inject 0.1 mLs (10 Units total) into the skin 2 (two) times daily. What changed:  how much to take   Insulin Syringe-Needle U-100 31G X 5/16" 1 ML Misc Commonly known as: B-D INS SYR ULTRAFINE 1CC/31G Used to inject insulin twice daily.   levothyroxine 50 MCG tablet Commonly known as: SYNTHROID Take 50 mcg by mouth daily before breakfast.   Magnesium Oxide 420 MG Tabs Take 420 mg by mouth 2 (two) times daily.   nitroGLYCERIN 0.4 MG SL tablet Commonly known as: NITROSTAT Place 1 tablet (0.4 mg total) under the tongue every 5 (five) minutes as needed for chest pain.   NovoLOG FlexPen 100 UNIT/ML FlexPen Generic drug: insulin aspart  Inject 0-20 Units into the skin See admin instructions. Sliding scale per endocrinologist. (3 times with each meal) Less than 100 = 0  100 -150 = 5 units  150-200 = 8 units   200-250 =12 units   OneTouch Verio test strip Generic drug: glucose blood 1 each by Other route 4 (four) times daily -  before meals and at bedtime. dexcom 6   Ozempic (1 MG/DOSE) 2 MG/1.5ML Sopn Generic drug: Semaglutide (1 MG/DOSE) Inject 2 mg into the skin every Monday.   pantoprazole 40 MG tablet Commonly known as: PROTONIX Take 1 tablet (40 mg total) by mouth daily.   PRESCRIPTION MEDICATION Medication  Study for cholesterol, injection on [address] with Dr. Every eight weeks Last visit 02/26- next appointment May   rosuvastatin 40 MG tablet Commonly known as: CRESTOR TAKE 1 TABLET BY MOUTH DAILY What changed: when to take this   sodium bicarbonate 650 MG tablet Take 650 mg by mouth 2 (two) times daily.   tamsulosin 0.4 MG Caps capsule Commonly known as: FLOMAX Take 1 capsule (0.4 mg total) by mouth daily.   topiramate 100 MG tablet Commonly known as: TOPAMAX Take 1 tablet (100 mg total) by mouth 2 (two) times daily.   Vitamin D 50 MCG (2000 UT) tablet Take 2,000 Units by mouth daily.           Allergies Allergies  Allergen Reactions   Flexeril [Cyclobenzaprine Hcl] Other (See Comments)    Causes him to pass out and lowers his  BP   Lidocaine Swelling    throat   Novocain [Procaine Hcl] Swelling    Outstanding Labs/Studies    Duration of Discharge Encounter- 15 minutes   Signed, Jonita Albee, PA-C 09/11/2023, 12:56 PM

## 2023-09-12 ENCOUNTER — Encounter (HOSPITAL_COMMUNITY): Payer: Self-pay | Admitting: Cardiology

## 2023-09-17 ENCOUNTER — Encounter: Payer: Self-pay | Admitting: Physical Therapy

## 2023-09-17 ENCOUNTER — Ambulatory Visit: Attending: Adult Health | Admitting: Physical Therapy

## 2023-09-17 DIAGNOSIS — I69351 Hemiplegia and hemiparesis following cerebral infarction affecting right dominant side: Secondary | ICD-10-CM | POA: Insufficient documentation

## 2023-09-17 DIAGNOSIS — R2681 Unsteadiness on feet: Secondary | ICD-10-CM | POA: Diagnosis not present

## 2023-09-17 DIAGNOSIS — R269 Unspecified abnormalities of gait and mobility: Secondary | ICD-10-CM | POA: Insufficient documentation

## 2023-09-17 DIAGNOSIS — M6281 Muscle weakness (generalized): Secondary | ICD-10-CM | POA: Insufficient documentation

## 2023-09-17 DIAGNOSIS — R293 Abnormal posture: Secondary | ICD-10-CM | POA: Insufficient documentation

## 2023-09-17 DIAGNOSIS — R2689 Other abnormalities of gait and mobility: Secondary | ICD-10-CM | POA: Diagnosis not present

## 2023-09-17 NOTE — Therapy (Unsigned)
 OUTPATIENT PHYSICAL THERAPY NEURO EVALUATION   Patient Name: Jeff [name] MRN: [id number] DOB:May 02, 1951, 73 y.o., male Today's Date: 09/18/2023   PCP: [facility] [place] REFERRING PROVIDER: Ihor Austin, NP  END OF SESSION:  PT End of Session - 09/18/23 1019     Visit Number 1    Number of Visits 7   2x/wk x 2 wks, then 1x/wk x 2 wks = 6 visits   Date for PT Re-Evaluation 10/18/23    Authorization Type [employer]IC[employer] CHO[employer] PPO    Authorization Time Period 09-17-23 - 11-17-23    PT Start Time 1316    PT Stop Time 1405    PT Time Calculation (min) 49 min    Activity Tolerance Patient tolerated treatment well    Behavior During Therapy [facility] for tasks assessed/performed             Past Medical History:  Diagnosis Date   ADHD (attention deficit hyperactivity disorder)    CVA (cerebral vascular accident) (HCC) 2022   Diabetes mellitus    Hyperlipidemia    Hypertension    Renal disorder    Seizures (HCC)    Stroke [facility])    Past Surgical History:  Procedure Laterality Date   CORONARY LITHOTRIPSY N/A 09/11/2023   Procedure: CORONARY LITHOTRIPSY;  Surgeon: Yates Decamp, MD;  Location: [facility] INVASIVE CV LAB;  Service: Cardiovascular;  Laterality: N/A;   CORONARY STENT INTERVENTION N/A 09/11/2023   Procedure: CORONARY STENT INTERVENTION;  Surgeon: Yates Decamp, MD;  Location: [facility] INVASIVE CV LAB;  Service: Cardiovascular;  Laterality: N/A;   IR ANGIO INTRA EXTRACRAN SEL COM CAROTID INNOMINATE BILAT MOD SED  01/03/2021   IR ANGIO VERTEBRAL SEL SUBCLAVIAN INNOMINATE UNI L MOD SED  01/03/2021   IR ANGIO VERTEBRAL SEL VERTEBRAL UNI R MOD SED  01/03/2021   IR RADIOLOGIST EVAL & MGMT  01/13/2021   IR US GUIDE VASC ACCESS RIGHT  01/03/2021   NECK SURGERY     RIGHT/LEFT HEART CATH AND CORONARY ANGIOGRAPHY N/A 09/11/2023   Procedure: RIGHT/LEFT HEART CATH AND CORONARY ANGIOGRAPHY;  Surgeon: Yates Decamp, MD;  Location: [facility] INVASIVE CV LAB;  Service: Cardiovascular;  Laterality: N/A;   SPINE SURGERY      Cervical spine x 2; Vear Clock; Critzer.   Patient Active Problem List   Diagnosis Date Noted   S/P primary angioplasty with coronary stent 09/11/2023   Benign essential hypertension 11/20/2022   Hypomagnesemia 11/20/2022   Falls 11/20/2022   Obstructive sleep apnea (adult) (pediatric) 11/20/2022   Syncope and collapse 11/20/2022   Uncontrolled type 2 diabetes mellitus with hypoglycemia, with long-term current use of insulin (HCC) 11/20/2022   Acute encephalopathy 11/20/2022   Hypoglycemia 11/20/2022   TIA (transient ischemic attack) 03/10/2020   CKD (chronic kidney disease) 08/28/2018   Retinopathy of both eyes 08/28/2018   Stroke due to embolism of left vertebral artery (HCC) 08/12/2018   Stroke (HCC) 08/08/2018   Diabetes mellitus (HCC) 07/25/2015   Neck injury 06/22/2015   Hearing loss in right ear 10/14/2011   Agent orange exposure 10/14/2011   ADD (attention deficit disorder) 10/14/2011   Hyperlipidemia 10/14/2011   BMI 28.0-28.9,adult 10/14/2011    ONSET DATE: Referral date 08-26-23:  09-11-23 for heart catheterization & coronary stent  REFERRING DIAG: R26.9 (ICD-10-CM) - Gait abnormality  THERAPY DIAG:  Other abnormalities of gait and mobility  Unsteadiness on feet  Muscle weakness (generalized)  Abnormal posture  Hemiplegia and hemiparesis following cerebral infarction affecting right dominant side (HCC)  Rationale for Evaluation  and Treatment: Rehabilitation  SUBJECTIVE:                                                                                                                                                                                             SUBJECTIVE STATEMENT: Pt presents to PT amb. Independently without use of device.  Pt is familiar to this clinic and this PT as he was most recently seen at this facility from 03-21-23 - 06-18-23 for 15 visits OP PT.  Pt reports he had difficulty breathing which progressively worsened after time of discharge in  mid Jan.  Pt reports he became unable to complete his ADL's independently due to inability to breathe.  Pt was referred back to PT on 08-26-23 but rescheduled this initial eval after heart catheterization was scheduled for 09-11-23.  Pt reports he is doing better now because "I can breathe".   Pt accompanied by:  wife, [name]  PERTINENT HISTORY: hx of seizures, hx of left cerebellum stroke (March 2020) with residual imbalance/gait impairment and right-sided ataxia, TIA (July 2022), transient AMS possibly in setting of hypoglycemia, left-sided headaches, hypertension, hyperlipidemia, diabetes mellitus, h/o cervical fusion C5-7 (2012), h/o neck injury 2016, stage 3 CKD, and intracranial stenosis   Right and Left Heart Catheterization 09/11/2023: S/P primary angioplasty with coronary stent 09-11-23 Plans to start cardiac rehab soon (waiting for this to be scheduled)  PAIN:  Are you having pain?  C/o soreness/discomfort due to surgery  last week  PRECAUTIONS: Fall and Other: on Plavix  RED FLAGS: None   WEIGHT BEARING RESTRICTIONS: No  FALLS: Has patient fallen in last 6 months? Yes. Number of falls 3  LIVING ENVIRONMENT:  Lives with: lives with their spouse Lives in: House/apartment Stairs: Yes: External: 1 to porch, 1 to foyer steps; none can use ramp at back of house Has following equipment at home:  "I've got it all"  PLOF: Independent with basic ADLs, Independent with household mobility without device, Independent with community mobility without device, and Independent with transfers  PATIENT GOALS: Improve balance and walking  OBJECTIVE:  Note: Objective measures were completed at Evaluation unless otherwise noted.  DIAGNOSTIC FINDINGS: CT Angio Chest: 07-19-23 IMPRESSION: 1. No pulmonary embolus. 2. No acute intrathoracic abnormality. 3. Aortic Atherosclerosis (ICD10-I70.0) including three-vessel coronary artery and aortic valve leaflet calcifications-correlate for aortic  stenosis. 4. Small hiatal hernia.  COGNITION: Overall cognitive status: Within functional limits for tasks assessed   SENSATION: WFL  COORDINATION: WNL's bil. LE's  POSTURE: rounded shoulders, forward head, increased thoracic kyphosis, and flexed trunk   LOWER EXTREMITY ROM:  WNL's bil. LE's    LOWER EXTREMITY  MMT:  WFL's   BED MOBILITY:  Findings: Independent   TRANSFERS: Sit to stand: Modified independence  Assistive device utilized: None      STAIRS: Used bil hand rails - step over step sequence  GAIT: Gait pattern: step through pattern, decreased arm swing- Right, decreased arm swing- Left, decreased step length- Right, decreased step length- Left, decreased hip/knee flexion- Right, decreased ankle dorsiflexion- Right, decreased ankle dorsiflexion- Left, trunk flexed, poor foot clearance- Right, and poor foot clearance- Left Distance walked: various clinical distances Assistive device utilized: None Level of assistance: SBA Comments:    FUNCTIONAL TESTS:  5 times sit to stand: 11.25 secs without UE from mat  Timed up and go (TUG): 10.43 secs without device 10 meter walk test: 11.65 secs = 2.82 ft/sec without device Functional gait assessment:  score 18/30          09/17/23 0001  Functional Gait  Assessment  Gait Level Surface 2 (6.54, 7.00 secs)  Change in Gait Speed 2  Gait with Horizontal Head Turns 2  Gait with Vertical Head Turns 1  Gait and Pivot Turn 3  Step Over Obstacle 3  Gait with Narrow Base of Support 0  Gait with Eyes Closed 1  Ambulating Backwards 2  Steps 2  Total Score 18     LLE SLS = 4.00 secs   RLE SLS = unable                                                                                                                    TREATMENT DATE: 09-16-23 Self care:  discussed eval findings and POC:  reviewed previous balance HEP as these exercises are still appropriate; emphasized need for SLS exercise   Compared today's FGA score with  previous scores (19/30, 24/30)  PATIENT EDUCATION: Education details: see above Person educated: Patient and Spouse Education method: Explanation Education comprehension: verbalized understanding  HOME EXERCISE PROGRAM: Reviewed previous HEP - will continue to modify and update as appropriate  GOALS: Goals reviewed with patient? Yes  SHORT TERM GOALS: SAME AS LTG'S AS ELOS = 4 WEEKS   LONG TERM GOALS: Target date: 10-18-23  Pt will improve FGA score to >/= 24/30 to demonstrate reduced fall risk (fall risk < 22/30) and improved dynamic balance.  Baseline: 18/30 Goal status: INITIAL  2.  Increase gait velocity to >/= 3.1 ft/sec for increased gait efficiency (no device). Baseline: 10 meter walk test: 11.65 secs = 2.82 ft/sec without device Goal status: INITIAL  3.  Pt will amb. 250' without device on flat, even surface demonstrating upright posture, and good RLE swing through (with hip and knee flexion) and arm swing during gait for less postural deviations.   Baseline: moderate postural deviations including trunk flexion, decreased Rt hip and knee flexion in swing and decreased Rt arm swing Goal status: INITIAL  4.  Pt will demonstrate improved balance by ability to stand on RLE for at least 2 secs to increase safety with stepping over objects. Baseline: unable  to perform SLS on RLE  Goal status: INITIAL  5.  Independent in HEP for balance and strengthening.   Baseline:  Goal status: INITIAL   ASSESSMENT:  CLINICAL IMPRESSION: Patient is a 73 y.o. gentleman who was seen today for physical therapy evaluation and treatment for gait and balance impairments due to h/o CVA in March 2020.  Pt had heart catheterization and coronary stent on 09-11-23.  Pt presents with gait and balance deficits with FGA score 18/30. Pt unable to amb. With vertical head turns due to c/o moderate dizziness provoked with this activity.  Pt's TUG score of 10.43 secs is not indicative of fall risk as < 13.5  secs.  Pt is currently amb. Without use of assistive device but does demonstrate postural deviations including mild trunk flexion, decreased Rt hip and knee flexion in swing phase of gait and decreased Rt arm swing.  Pt is unable to perform SLS on RLE; able to perform LLE SLS for 4.00 secs.  Pt has decreased endurance/activity tolerance due to recent cardiac surgery with heart catherization and coronary stent implant on 09-11-23.  Pt will benefit from PT to address balance and gait impaiments and decreased activity tolerance.    OBJECTIVE IMPAIRMENTS: Abnormal gait, decreased activity tolerance, decreased balance, decreased endurance, decreased strength, dizziness, and postural dysfunction.   ACTIVITY LIMITATIONS: carrying, lifting, bending, squatting, stairs, and locomotion level  PARTICIPATION LIMITATIONS: meal prep, cleaning, laundry, driving, shopping, community activity, and yard work  PERSONAL FACTORS: Past/current experiences and 1-2 comorbidities: h/o CVA, h/o seizures and recent heart catheterization   are also affecting patient's functional outcome.   REHAB POTENTIAL: Good  CLINICAL DECISION MAKING: Evolving/moderate complexity  EVALUATION COMPLEXITY: Moderate  PLAN:  PT FREQUENCY: 2x/week for 2 weeks, then 1x/wk for 2 weeks = 7 visits + eval  PT DURATION: 4 weeks   PLANNED INTERVENTIONS: 97110-Therapeutic exercises, 97530- Therapeutic activity, [id number]- Neuromuscular re-education, 97535- Self Care, and 16109- Gait training  PLAN FOR NEXT SESSION: review HEP; dynamic balance and gait training   Shantrice Rodenberg, Celeste Cola, PT 09/18/2023, 10:25 AM

## 2023-09-18 NOTE — Progress Notes (Signed)
   09/17/23 0001  Functional Gait  Assessment  Gait Level Surface 2  Change in Gait Speed 2 (6.54, 7.00)  Gait with Horizontal Head Turns 2  Gait with Vertical Head Turns 1  Gait and Pivot Turn 3  Step Over Obstacle 3  Gait with Narrow Base of Support 0  Gait with Eyes Closed 1  Ambulating Backwards 2  Steps 2  Total Score 18

## 2023-09-23 DIAGNOSIS — H16223 Keratoconjunctivitis sicca, not specified as Sjogren's, bilateral: Secondary | ICD-10-CM | POA: Diagnosis not present

## 2023-09-23 DIAGNOSIS — I679 Cerebrovascular disease, unspecified: Secondary | ICD-10-CM | POA: Diagnosis not present

## 2023-09-23 DIAGNOSIS — Z961 Presence of intraocular lens: Secondary | ICD-10-CM | POA: Diagnosis not present

## 2023-09-24 ENCOUNTER — Ambulatory Visit: Admitting: Physical Therapy

## 2023-09-24 VITALS — BP 115/76 | HR 81

## 2023-09-24 DIAGNOSIS — M6281 Muscle weakness (generalized): Secondary | ICD-10-CM | POA: Diagnosis not present

## 2023-09-24 DIAGNOSIS — R293 Abnormal posture: Secondary | ICD-10-CM

## 2023-09-24 DIAGNOSIS — I69351 Hemiplegia and hemiparesis following cerebral infarction affecting right dominant side: Secondary | ICD-10-CM | POA: Diagnosis not present

## 2023-09-24 DIAGNOSIS — R2681 Unsteadiness on feet: Secondary | ICD-10-CM

## 2023-09-24 DIAGNOSIS — R2689 Other abnormalities of gait and mobility: Secondary | ICD-10-CM

## 2023-09-24 DIAGNOSIS — R269 Unspecified abnormalities of gait and mobility: Secondary | ICD-10-CM | POA: Diagnosis not present

## 2023-09-24 NOTE — Therapy (Signed)
 OUTPATIENT PHYSICAL THERAPY NEURO TREATMENT   Patient Name: Jeff [name] MRN: [id number] DOB:1951/03/13, 73 y.o., male Today's Date: 09/25/2023   PCP: [facility] [place] REFERRING PROVIDER: Johny Nap, NP  END OF SESSION:  PT End of Session - 09/24/23 1524     Visit Number 2    Number of Visits 7   2x/wk x 2 wks, then 1x/wk x 2 wks = 6 visits   Date for PT Re-Evaluation 10/18/23    Authorization Type [employer]ICARE CHOICE PPO    Authorization Time Period 09-17-23 - 11-17-23    PT Start Time 1522    PT Stop Time 1610    PT Time Calculation (min) 48 min    Equipment Utilized During Treatment Gait belt    Activity Tolerance Other (comment)   pt with absent seizure during session   Behavior During Therapy [facility] for tasks assessed/performed              Past Medical History:  Diagnosis Date   ADHD (attention deficit hyperactivity disorder)    CVA (cerebral vascular accident) (HCC) 2022   Diabetes mellitus    Hyperlipidemia    Hypertension    Renal disorder    Seizures (HCC)    Stroke [facility])    Past Surgical History:  Procedure Laterality Date   CORONARY LITHOTRIPSY N/A 09/11/2023   Procedure: CORONARY LITHOTRIPSY;  Surgeon: Knox Perl, MD;  Location: [facility] INVASIVE CV LAB;  Service: Cardiovascular;  Laterality: N/A;   CORONARY STENT INTERVENTION N/A 09/11/2023   Procedure: CORONARY STENT INTERVENTION;  Surgeon: Knox Perl, MD;  Location: [facility] INVASIVE CV LAB;  Service: Cardiovascular;  Laterality: N/A;   IR ANGIO INTRA EXTRACRAN SEL COM CAROTID INNOMINATE BILAT MOD SED  01/03/2021   IR ANGIO VERTEBRAL SEL SUBCLAVIAN INNOMINATE UNI L MOD SED  01/03/2021   IR ANGIO VERTEBRAL SEL VERTEBRAL UNI R MOD SED  01/03/2021   IR RADIOLOGIST EVAL & MGMT  01/13/2021   IR US  GUIDE VASC ACCESS RIGHT  01/03/2021   NECK SURGERY     RIGHT/LEFT HEART CATH AND CORONARY ANGIOGRAPHY N/A 09/11/2023   Procedure: RIGHT/LEFT HEART CATH AND CORONARY ANGIOGRAPHY;  Surgeon: Knox Perl, MD;  Location: [facility]  INVASIVE CV LAB;  Service: Cardiovascular;  Laterality: N/A;   SPINE SURGERY     Cervical spine x 2; Valda Garnet; [facility].   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 (HCC) 08/12/2018   Stroke (HCC) 08/08/2018   Diabetes mellitus (HCC) 07/25/2015   Neck injury 06/22/2015   Hearing loss in right ear 10/14/2011   Agent orange exposure 10/14/2011   ADD (attention deficit disorder) 10/14/2011   Hyperlipidemia 10/14/2011   BMI 28.0-28.9,adult 10/14/2011    ONSET DATE: Referral date 08-26-23:  09-11-23 for heart catheterization & coronary stent  REFERRING DIAG: R26.9 (ICD-10-CM) - Gait abnormality  THERAPY DIAG:  Other abnormalities of gait and mobility  Unsteadiness on feet  Muscle weakness (generalized)  Abnormal posture  Rationale for Evaluation and Treatment: Rehabilitation  SUBJECTIVE:                                                                                                                                                                                             SUBJECTIVE STATEMENT: Pt sees Dr. Berry Bristol (his cardiologist) on Monday and then will be called about scheduling cardiac rehab after that visit.  Pt reports no falls since last visit. Pt did have one seizure last Tuesday after his PT Evaluation, he reports his BG had dropped to 55. Pt's wife did call EMS due to severity of  seizure.  Pt's BG 260 today at beginning of session. Pt reports he will be getting an insulin  pump as well as his BG monitor.  Pt accompanied by:  wife, [name]  PERTINENT HISTORY: hx of seizures, hx of left cerebellum stroke (March 2020) with residual imbalance/gait impairment and right-sided ataxia, TIA (July 2022), transient AMS possibly in setting of hypoglycemia, left-sided headaches, hypertension, hyperlipidemia, diabetes mellitus, h/o cervical fusion C5-7 (2012), h/o neck injury 2016, stage 3 CKD, and intracranial stenosis   Right and Left Heart Catheterization 09/11/2023: S/P primary angioplasty with coronary stent 09-11-23 Plans to start cardiac rehab soon (waiting for this to be scheduled)  PAIN:  Are you having pain?  C/o soreness/discomfort due to surgery  last week  PRECAUTIONS: Fall and Other: on Plavix   RED FLAGS: None   WEIGHT BEARING RESTRICTIONS: No  FALLS: Has patient fallen in last 6 months? Yes. Number of falls 3  LIVING ENVIRONMENT:  Lives with: lives with their spouse Lives in: House/apartment Stairs: Yes: External: 1 to porch, 1 to foyer steps; none can use ramp at back of house Has following equipment at home:  "I've got it all"  PLOF: Independent with basic ADLs, Independent with household mobility without device, Independent with community mobility without device, and Independent with transfers  PATIENT GOALS: Improve balance and walking  OBJECTIVE:  Note: Objective measures were completed at Evaluation unless otherwise noted.  DIAGNOSTIC FINDINGS: CT Angio Chest: 07-19-23 IMPRESSION: 1. No pulmonary embolus. 2. No acute intrathoracic abnormality. 3. Aortic Atherosclerosis (ICD10-I70.0) including three-vessel coronary artery and aortic valve leaflet calcifications-correlate for aortic stenosis. 4. Small hiatal hernia.  COGNITION: Overall cognitive status: Within functional limits for tasks assessed   SENSATION: WFL  COORDINATION: WNL's bil.  LE's  POSTURE: rounded shoulders, forward head, increased thoracic kyphosis, and flexed trunk   LOWER EXTREMITY ROM:  WNL's bil. LE's    LOWER EXTREMITY MMT:  WFL's   BED MOBILITY:  Findings: Independent   TRANSFERS: Sit to stand: Modified independence  Assistive device  utilized: None      STAIRS: Used bil hand rails - step over step sequence  GAIT: Gait pattern: step through pattern, decreased arm swing- Right, decreased arm swing- Left, decreased step length- Right, decreased step length- Left, decreased hip/knee flexion- Right, decreased ankle dorsiflexion- Right, decreased ankle dorsiflexion- Left, trunk flexed, poor foot clearance- Right, and poor foot clearance- Left Distance walked: various clinical distances Assistive device utilized: None Level of assistance: SBA Comments:    FUNCTIONAL TESTS:  5 times sit to stand: 11.25 secs without UE from mat  Timed up and go (TUG): 10.43 secs without device 10 meter walk test: 11.65 secs = 2.82 ft/sec without device Functional gait assessment:  score 18/30          09/17/23 0001  Functional Gait  Assessment  Gait Level Surface 2 (6.54, 7.00 secs)  Change in Gait Speed 2  Gait with Horizontal Head Turns 2  Gait with Vertical Head Turns 1  Gait and Pivot Turn 3  Step Over Obstacle 3  Gait with Narrow Base of Support 0  Gait with Eyes Closed 1  Ambulating Backwards 2  Steps 2  Total Score 18     LLE SLS = 4.00 secs   RLE SLS = unable                                                                                                                    TREATMENT:  TherAct Review of previous HEP: Static stance with EC x 30 sec, easy Static stance feet together x 30 sec, easy Static stance EC and feet together x 30 sec, challenging SLS x 10-15 sec B with intermittent fingertip support, very challenging  Updated HEP based on performance with balance exercises this date, pt declines a handout  Following standing balance  activities pt reports he is feeling dizzy, returned to sitting on mat table. Seated BP: 128/77, heart rate: 87.  Pt attempts to stand again to resume therapy but reports he still feels dizzy, returns to sitting. Pt then has an absent seizure that lasts less than a few minutes, no lability or tearfulness once he comes out of seizure this time. Reassessed vitals following seizure, BP: 115/76, HR 81. Pt agreeable to continue with PT session.   Vitals:   09/25/23 0837 09/25/23 0838  BP: 128/77 115/76  Pulse: 87 81    Gait to colored vinyl targets to increase step length with good carryover to gait around gym x 300 ft. Encouraged patient to continue to take large steps as he exits his appointment and returns to his car as well.  PATIENT EDUCATION: Education details: revised HEP Person educated: Patient and Spouse Education method: Medical illustrator Education comprehension: verbalized understanding and returned demonstration  HOME EXERCISE PROGRAM: Access Code: [email or web address] URL: [email or web address] Date: 09/24/2023 Prepared by: Lorita Rosa  Exercises - Romberg Stance with Eyes Closed  - 1 x daily - 7 x weekly - 1 sets - 5 reps - 30 seconds hold - Wide Tandem  Stance with Eyes Closed  - 1 x daily - 7 x weekly - 1 sets - 5 reps - 30 seconds hold - Single Leg Stance with Support  - 1 x daily - 7 x weekly - 1 sets - 5 reps - 30 sec hold   GOALS: Goals reviewed with patient? Yes  SHORT TERM GOALS: SAME AS LTG'S AS ELOS = 4 WEEKS   LONG TERM GOALS: Target date: 10-18-23  Pt will improve FGA score to >/= 24/30 to demonstrate reduced fall risk (fall risk < 22/30) and improved dynamic balance.  Baseline: 18/30 Goal status: INITIAL  2.  Increase gait velocity to >/= 3.1 ft/sec for increased gait efficiency (no device). Baseline: 10 meter walk test: 11.65 secs = 2.82 ft/sec without device Goal status: INITIAL  3.  Pt will amb. 250' without device on flat, even  surface demonstrating upright posture, and good RLE swing through (with hip and knee flexion) and arm swing during gait for less postural deviations.   Baseline: moderate postural deviations including trunk flexion, decreased Rt hip and knee flexion in swing and decreased Rt arm swing Goal status: INITIAL  4.  Pt will demonstrate improved balance by ability to stand on RLE for at least 2 secs to increase safety with stepping over objects. Baseline: unable to perform SLS on RLE  Goal status: INITIAL  5.  Independent in HEP for balance and strengthening.   Baseline:  Goal status: INITIAL   ASSESSMENT:  CLINICAL IMPRESSION: Emphasis of skilled PT session on reviewing HEP from previous POC, adjusting/revising HEP, monitoring patient following another absent seizure, and continuing to work on increasing step length. See above for updated HEP. Pt does experience an absent seizure again during PT this visit, seizure lasts less than a few minutes and patient is not emotionally labile following seizure like he has been before. Also, patient's BP remains above 200 during session and typically it is low when he experiences seizures. Pt able to resume participation in PT session following his seizure. Pt with good carryover of increased step length practice, does need cues and encouragement to continue this outside of PT session. Continue POC.     OBJECTIVE IMPAIRMENTS: Abnormal gait, decreased activity tolerance, decreased balance, decreased endurance, decreased strength, dizziness, and postural dysfunction.   ACTIVITY LIMITATIONS: carrying, lifting, bending, squatting, stairs, and locomotion level  PARTICIPATION LIMITATIONS: meal prep, cleaning, laundry, driving, shopping, community activity, and yard work  PERSONAL FACTORS: Past/current experiences and 1-2 comorbidities: h/o CVA, h/o seizures and recent heart catheterization   are also affecting patient's functional outcome.   REHAB POTENTIAL:  Good  CLINICAL DECISION MAKING: Evolving/moderate complexity  EVALUATION COMPLEXITY: Moderate  PLAN:  PT FREQUENCY: 2x/week for 2 weeks, then 1x/wk for 2 weeks = 7 visits + eval  PT DURATION: 4 weeks   PLANNED INTERVENTIONS: 97110-Therapeutic exercises, 97530- Therapeutic activity, 97112- Neuromuscular re-education, 97535- Self Care, and [address]109- Gait training  PLAN FOR NEXT SESSION: dynamic balance and gait training, increasing step length and step height, work on balance reactions, when does he start cardiac rehab?   Evangelos Paulino, PT Lorita Rosa, PT, DPT, CSRS  09/25/2023, 8:38 AM

## 2023-09-29 NOTE — Progress Notes (Unsigned)
 Cardiology Office Note:  .   Date:  09/30/2023  ID:  Jeff [name], DOB [birth year], MRN [id number] PCP: [facility]  [facility] Providers Cardiologist:  Knox Perl, MD   History of Present Illness: .   Jeff [name] is a 73 y.o. Male patient with primary hypertension, diabetes mellitus with stage IIIa chronic kidney disease, hypercholesterolemia, left vertebral artery embolic stroke SP tPA in 2020 and TIA in July 2022 felt to be due to intracranial left MCA stenosis, mild right-sided residual weakness who has had seizure-like activity x 3 with ED visits and latest on 11/22/2022 felt to be due to pseudoseizure from severe hypoglycemia and Ozempic  was discontinued.   I had seen him on 08/27/2023 and in spite of normal myocardial perfusion scan on 08/09/2023 and echocardiogram revealing normal LVEF on 01/02/2021, he underwent cardiac catheterization on 09/11/2023 and underwent angioplasty and stenting to proximal and mid LAD with a 3.5 x 28 mm Synergy DES and OM1 with a 3.0 x 16 mm Synergy XD DES and now presents for follow-up.  Discussed the use of AI scribe software for clinical note transcription with the patient, who gave verbal consent to proceed.  History of Present Illness The patient, with a history of coronary artery disease, presents for a follow-up after recent angioplasty. He reports feeling 'great' and 'already feel better' immediately after the procedure. He denies any discomfort or limitations in his daily activities. He expresses gratitude and satisfaction with the care received.  The patient also inquires about the status of his other arterial blockages and the potential impact on his kidney function due to the dye used during the angioplasty. He expresses a desire to participate in cardiac rehabilitation and is awaiting contact from the relevant team.  Labs   Lab Results  Component Value Date   CHOL 96 01/02/2021   HDL 25 (L) 01/02/2021   LDLCALC 41 01/02/2021   TRIG  152 (H) 01/02/2021   CHOLHDL 3.8 01/02/2021   Lab Results  Component Value Date   NA 140 09/11/2023   K 3.9 09/11/2023   CO2 22 08/27/2023   GLUCOSE 136 (H) 08/27/2023   BUN 19 08/27/2023   CREATININE 1.56 (H) 08/27/2023   CALCIUM  9.1 08/27/2023   EGFR 46 (L) 08/27/2023   GFRNONAA 42 (L) 07/19/2023      Latest Ref Rng & Units 09/11/2023    9:55 AM 09/11/2023    9:49 AM 09/11/2023    9:45 AM  BMP  Sodium 135 - 145 mmol/L 140  140    142  141    141   Potassium 3.5 - 5.1 mmol/L 3.9  3.9    3.6  3.9    4.0       Latest Ref Rng & Units 09/11/2023    9:55 AM 09/11/2023    9:49 AM 09/11/2023    9:45 AM  CBC  Hemoglobin 13.0 - 17.0 g/dL 82.9  56.2    13.0  86.5    14.6   Hematocrit 39.0 - 52.0 % 43.0  43.0    41.0  44.0    43.0    Lab Results  Component Value Date   HGBA1C 6.7 (H) 11/20/2022    Lab Results  Component Value Date   TSH 1.915 02/27/2014     Review of Systems  Cardiovascular:  Positive for palpitations (brief and at rest rare). Negative for chest pain, dyspnea on exertion and leg swelling.   Physical Exam:   VS:  BP 134/86   Pulse 72   Ht 5\' 8"  (1.727 m)   Wt 193 lb 3.2 oz (87.6 kg)   SpO2 96%   BMI 29.38 kg/m    Wt Readings from Last 3 Encounters:  09/30/23 193 lb 3.2 oz (87.6 kg)  09/11/23 181 lb (82.1 kg)  08/27/23 196 lb 6.4 oz (89.1 kg)    Physical Exam Neck:     Vascular: No carotid bruit or JVD.  Cardiovascular:     Rate and Rhythm: Normal rate and regular rhythm.     Pulses: Intact distal pulses.     Heart sounds: Normal heart sounds. No murmur heard.    No gallop.  Pulmonary:     Effort: Pulmonary effort is normal.     Breath sounds: Normal breath sounds.  Abdominal:     General: Bowel sounds are normal.     Palpations: Abdomen is soft.  Musculoskeletal:     Right lower leg: No edema.     Left lower leg: No edema.    Studies Reviewed: Aaron Aas    CARDIAC CATHETERIZATION 09/11/2023   3.5 x 28 mm Synergy XD DES proximal and mid LAD. 3.0  x 16 mm Synergy XD DES mid OM1 Residual 85% stenosis and small to moderate-sized OM 2 and 90% in apical LAD.     EKG:    EKG Interpretation Date/Time:  Monday September 30 2023 11:54:04 EDT Ventricular Rate:  68 PR Interval:  152 QRS Duration:  88 QT Interval:  408 QTC Calculation: 433 R Axis:   8  Text Interpretation: EKG 09/30/2023: Normal sinus rhythm at rate of 68 bpm, normal EKG.  Compared to 09/11/2023, no significant change. Confirmed by Astin Rape, Jagadeesh ([id number]) on 09/30/2023 11:58:15 AM   Medications and allergies    Allergies  Allergen Reactions   Flexeril [Cyclobenzaprine Hcl] Other (See Comments)    Causes him to pass out and lowers his BP   Lidocaine Swelling    throat   Novocain [Procaine Hcl] Swelling     Current Outpatient Medications:    acetaminophen  (TYLENOL ) 500 MG tablet, Take 500-1,000 mg by mouth every 6 (six) hours as needed for headache., Disp: , Rfl:    amitriptyline  (ELAVIL ) 10 MG tablet, Take 1 tablet (10 mg total) by mouth at bedtime., Disp: 30 tablet, Rfl: 11   amLODipine  (NORVASC ) 2.5 MG tablet, Take 1 tablet (2.5 mg total) by mouth daily., Disp: 30 tablet, Rfl: 2   aspirin  EC 81 MG EC tablet, Take 1 tablet (81 mg total) by mouth daily. Swallow whole., Disp: 30 tablet, Rfl: 11   Cholecalciferol (VITAMIN D) 50 MCG (2000 UT) tablet, Take 2,000 Units by mouth daily., Disp: , Rfl:    cycloSPORINE (RESTASIS) 0.05 % ophthalmic emulsion, Place 1 drop into both eyes 2 (two) times daily., Disp: , Rfl:    empagliflozin (JARDIANCE) 10 MG TABS tablet, Take 10 mg by mouth daily., Disp: , Rfl:    furosemide  (LASIX ) 20 MG tablet, Take 0.5 tablets (10 mg total) by mouth daily as needed for edema (Leg swelling)., Disp: 30 tablet, Rfl: 1   Glucagon  1 MG/0.2ML SOAJ, Inject 1 mg into the skin as needed (low blood sugar)., Disp: 0.2 mL, Rfl: 0   glucose blood (ONETOUCH VERIO) test strip, 1 each by Other route 4 (four) times daily -  before meals and at bedtime. dexcom 6,  Disp: , Rfl:    insulin  aspart (NOVOLOG  FLEXPEN) 100 UNIT/ML FlexPen, Inject 0-20 Units into the skin See admin  instructions. Sliding scale per endocrinologist. (3 times with each meal) Less than 100 = 0  100 -150 = 5 units  150-200 = 8 units   200-250 =12 units, Disp: 15 mL, Rfl: 11   insulin  glargine (LANTUS ) 100 UNIT/ML injection, Inject 0.1 mLs (10 Units total) into the skin 2 (two) times daily. (Patient taking differently: Inject 30 Units into the skin 2 (two) times daily.), Disp: , Rfl:    Insulin  Syringe-Needle U-100 (B-D INS SYR ULTRAFINE 1CC/31G) 31G X 5/16" 1 ML MISC, Used to inject insulin  twice daily., Disp: 90 each, Rfl: 2   levothyroxine  (SYNTHROID ) 50 MCG tablet, Take 50 mcg by mouth daily before breakfast., Disp: , Rfl:    Magnesium Oxide 420 MG TABS, Take 420 mg by mouth 2 (two) times daily., Disp: , Rfl:    Multiple Vitamins-Minerals (ICAPS AREDS 2 PO), Take 1 capsule by mouth in the morning and at bedtime., Disp: , Rfl:    nitroGLYCERIN  (NITROSTAT ) 0.4 MG SL tablet, Place 1 tablet (0.4 mg total) under the tongue every 5 (five) minutes as needed for chest pain., Disp: 25 tablet, Rfl: 3   pantoprazole  (PROTONIX ) 40 MG tablet, Take 1 tablet (40 mg total) by mouth daily., Disp: , Rfl:    PRESCRIPTION MEDICATION, Medication  Study for cholesterol, injection on [address] with Dr. Every eight weeks Last visit 02/26- next appointment May, Disp: , Rfl:    rosuvastatin  (CRESTOR ) 40 MG tablet, TAKE 1 TABLET BY MOUTH DAILY (Patient taking differently: Take 40 mg by mouth every evening.), Disp: 90 tablet, Rfl: 0   Semaglutide , 1 MG/DOSE, (OZEMPIC , 1 MG/DOSE,) 2 MG/1.5ML SOPN, Inject 2 mg into the skin every Monday., Disp: , Rfl:    sodium bicarbonate 650 MG tablet, Take 650 mg by mouth 2 (two) times daily., Disp: , Rfl:    tamsulosin  (FLOMAX ) 0.4 MG CAPS capsule, Take 1 capsule (0.4 mg total) by mouth daily., Disp: 30 capsule, Rfl: 0   topiramate  (TOPAMAX ) 100 MG tablet, Take 1 tablet (100 mg  total) by mouth 2 (two) times daily., Disp: 60 tablet, Rfl: 5   clopidogrel  (PLAVIX ) 75 MG tablet, Take 1 tablet (75 mg total) by mouth daily., Disp: 90 tablet, Rfl: 1   Meds ordered this encounter  Medications   clopidogrel  (PLAVIX ) 75 MG tablet    Sig: Take 1 tablet (75 mg total) by mouth daily.    Dispense:  90 tablet    Refill:  1     Medications Discontinued During This Encounter  Medication Reason   clopidogrel  (PLAVIX ) 75 MG tablet Reorder     ASSESSMENT AND PLAN: .      ICD-10-CM   1. Coronary artery disease of native artery of native heart with stable angina pectoris (HCC)  I25.118 EKG 12-Lead    clopidogrel  (PLAVIX ) 75 MG tablet    Lipid Profile    Basic Metabolic Panel (BMET)    2. Dyspnea on exertion  R06.09 Lipid Profile    Basic Metabolic Panel (BMET)    3. Primary hypertension  I10 Basic Metabolic Panel (BMET)    4. Mixed hyperlipidemia  E78.2 Lipid Profile      Assessment and Plan Assessment & Plan Coronary artery disease with stent placement   He has coronary artery disease with recent stent placement in major arteries, including the LAD and a marginal artery on 09/11/2023.  He has residual OM 2 small vessel disease and a nondominant RCA high-grade proximal stenosis and no angioplasty is indicated for this.  The decision not to intervene was based on the lack of clinical benefit and potential risks. He reports significant improvement in symptoms post-procedure. Recheck renal function to assess for any impact from contrast dye used during angioplasty. Send clopidogrel  prescription to [facility] clinic for a six-month supply. Encourage strict adherence to medications and lifestyle modifications, including exercise. Refer to a cardiac rehabilitation program to enhance recovery and provide confidence in physical activity.  Palpitations   He reports intermittent palpitations described as skipped beats occurring while at rest, such as during church service.  These are considered benign unless they become continuous or occur during exertion. He was reassured about the benign nature of these palpitations under the current circumstances. Monitor for any changes in the pattern or frequency of palpitations, especially if they become continuous or occur during physical activity.  Dyspnea on exertion Patient has noticed marked improvement in his dyspnea.  States that he felt well immediately after the angioplasty.  No clinical evidence of heart failure.  Primary hypertension Blood pressure is well-controlled on amlodipine  2.5 mg daily and furosemide  10 mg daily.  He is not on ACE inhibitors or ARB due to stage IIIb chronic kidney disease but is on Jardiance 10 mg daily, continue the same.  Mixed hypercholesterolemia Presently tolerating Crestor  40 mg daily, will check lipid profile testing today.  Will also do BMP today.  Otherwise I will see him back in 6 months for follow-up.  His right radial arterial access site has healed well.    Signed,  Knox Perl, MD, [facility] 09/30/2023, 12:19 PM [facility] [address] [place], [place] [address] Phone: [phone]. Fax:  [phone]

## 2023-09-30 ENCOUNTER — Encounter: Payer: Self-pay | Admitting: Cardiology

## 2023-09-30 ENCOUNTER — Ambulatory Visit: Attending: Cardiology | Admitting: Cardiology

## 2023-09-30 VITALS — BP 134/86 | HR 72 | Ht 68.0 in | Wt 193.2 lb

## 2023-09-30 DIAGNOSIS — R0609 Other forms of dyspnea: Secondary | ICD-10-CM

## 2023-09-30 DIAGNOSIS — I25118 Atherosclerotic heart disease of native coronary artery with other forms of angina pectoris: Secondary | ICD-10-CM | POA: Diagnosis not present

## 2023-09-30 DIAGNOSIS — E782 Mixed hyperlipidemia: Secondary | ICD-10-CM | POA: Diagnosis not present

## 2023-09-30 DIAGNOSIS — I10 Essential (primary) hypertension: Secondary | ICD-10-CM

## 2023-09-30 MED ORDER — CLOPIDOGREL BISULFATE 75 MG PO TABS: 75.0000 mg | ORAL_TABLET | Freq: Every day | ORAL | 1 refills | Status: DC

## 2023-09-30 NOTE — Patient Instructions (Addendum)
 Medication Instructions:  Your physician recommends that you continue on your current medications as directed. Please refer to the Current Medication list given to you today.  *If you need a refill on your cardiac medications before your next appointment, please call your pharmacy*  Lab Work: Have lab work (BMP and Lipids) checked today at [facility] on the first floor If you have labs (blood work) drawn today and your tests are completely normal, you will receive your results only by: MyChart Message (if you have MyChart) OR A paper copy in the mail If you have any lab test that is abnormal or we need to change your treatment, we will call you to review the results.  Testing/Procedures: none  Follow-Up: At [facility], you and your health needs are our priority.  As part of our continuing mission to provide you with exceptional heart care, our providers are all part of one team.  This team includes your primary Cardiologist (physician) and Advanced Practice Providers or APPs (Physician Assistants and Nurse Practitioners) who all work together to provide you with the care you need, when you need it.  Your next appointment:   6 month(s)  Provider:   Knox Perl, MD    We recommend signing up for the patient portal called "MyChart".  Sign up information is provided on this After Visit Summary.  MyChart is used to connect with patients for Virtual Visits (Telemedicine).  Patients are able to view lab/test results, encounter notes, upcoming appointments, etc.  Non-urgent messages can be sent to your provider as well.   To learn more about what you can do with MyChart, go to [email or web address].   Other Instructions

## 2023-10-01 ENCOUNTER — Encounter: Payer: Self-pay | Admitting: Cardiology

## 2023-10-01 ENCOUNTER — Encounter: Payer: Self-pay | Admitting: Physical Therapy

## 2023-10-01 ENCOUNTER — Ambulatory Visit: Admitting: Physical Therapy

## 2023-10-01 VITALS — HR 94

## 2023-10-01 DIAGNOSIS — R2681 Unsteadiness on feet: Secondary | ICD-10-CM

## 2023-10-01 DIAGNOSIS — R2689 Other abnormalities of gait and mobility: Secondary | ICD-10-CM

## 2023-10-01 DIAGNOSIS — R293 Abnormal posture: Secondary | ICD-10-CM | POA: Diagnosis not present

## 2023-10-01 DIAGNOSIS — M6281 Muscle weakness (generalized): Secondary | ICD-10-CM | POA: Diagnosis not present

## 2023-10-01 DIAGNOSIS — R269 Unspecified abnormalities of gait and mobility: Secondary | ICD-10-CM | POA: Diagnosis not present

## 2023-10-01 DIAGNOSIS — I69351 Hemiplegia and hemiparesis following cerebral infarction affecting right dominant side: Secondary | ICD-10-CM | POA: Diagnosis not present

## 2023-10-01 NOTE — Progress Notes (Signed)
 Normal lipids profile with mild elevation in triglycerides can improve with 5 lbs weight loss Normal lipids profile with mild elevation in triglycerides can improve with 5 lbs weight loss. Avoid fruit juices, fruit is okay. Avoid grapes and ripe banana. kIDNEY FUNCTION STABLE

## 2023-10-01 NOTE — Therapy (Signed)
 OUTPATIENT PHYSICAL THERAPY NEURO TREATMENT   Patient Name: Jeff [name] MRN: [id number] DOB:1951/03/04, 73 y.o., male Today's Date: 10/01/2023   PCP: Clinic, [place] REFERRING PROVIDER: Johny Nap, NP  END OF SESSION:  PT End of Session - 10/01/23 1417     Visit Number 3    Number of Visits 7   2x/wk x 2 wks, then 1x/wk x 2 wks = 6 visits   Date for PT Re-Evaluation 10/18/23    Authorization Type [employer]ICARE CHOICE PPO    Authorization Time Period 09-17-23 - 11-17-23    PT Start Time 1315    PT Stop Time 1346   session ended early due to pt not feeling well - c/o fatigue   PT Time Calculation (min) 31 min    Equipment Utilized During Treatment Gait belt    Activity Tolerance Patient limited by fatigue   pt with absent seizure during session   Behavior During Therapy [facility] for tasks assessed/performed               Past Medical History:  Diagnosis Date   ADHD (attention deficit hyperactivity disorder)    CVA (cerebral vascular accident) (HCC) 2022   Diabetes mellitus    Hyperlipidemia    Hypertension    Renal disorder    Seizures (HCC)    Stroke [facility])    Past Surgical History:  Procedure Laterality Date   CORONARY LITHOTRIPSY N/A 09/11/2023   Procedure: CORONARY LITHOTRIPSY;  Surgeon: Knox Perl, MD;  Location: [facility] INVASIVE CV LAB;  Service: Cardiovascular;  Laterality: N/A;   CORONARY STENT INTERVENTION N/A 09/11/2023   Procedure: CORONARY STENT INTERVENTION;  Surgeon: Knox Perl, MD;  Location: [facility] INVASIVE CV LAB;  Service: Cardiovascular;  Laterality: N/A;   IR ANGIO INTRA EXTRACRAN SEL COM CAROTID INNOMINATE BILAT MOD SED  01/03/2021   IR ANGIO VERTEBRAL SEL SUBCLAVIAN INNOMINATE UNI L MOD SED  01/03/2021   IR ANGIO VERTEBRAL SEL VERTEBRAL UNI R MOD SED  01/03/2021   IR RADIOLOGIST EVAL & MGMT  01/13/2021   IR US  GUIDE VASC ACCESS RIGHT  01/03/2021   NECK SURGERY     RIGHT/LEFT HEART CATH AND CORONARY ANGIOGRAPHY N/A 09/11/2023   Procedure: RIGHT/LEFT HEART  CATH AND CORONARY ANGIOGRAPHY;  Surgeon: Knox Perl, MD;  Location: [facility] INVASIVE CV LAB;  Service: Cardiovascular;  Laterality: N/A;   SPINE SURGERY     Cervical spine x 2; [facility]et; [facility].   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 (HCC) 08/12/2018   Stroke (HCC) 08/08/2018   Diabetes mellitus (HCC) 07/25/2015   Neck injury 06/22/2015   Hearing loss in right ear 10/14/2011   Agent orange exposure 10/14/2011   ADD (attention deficit disorder) 10/14/2011   Hyperlipidemia 10/14/2011   BMI 28.0-28.9,adult 10/14/2011    ONSET DATE: Referral date 08-26-23:  09-11-23 for heart catheterization & coronary stent  REFERRING DIAG: R26.9 (ICD-10-CM) - Gait abnormality  THERAPY DIAG:  Other abnormalities  of gait and mobility  Unsteadiness on feet  Muscle weakness (generalized)  Rationale for Evaluation and Treatment: Rehabilitation  SUBJECTIVE:                                                                                                                                                                                             SUBJECTIVE STATEMENT: Pt reports his blood sugar was low (below 100) this morning prior to PT appt - drank a Coke to increase it - blood sugar reading 136 at start of session Pt reports he has been doing the balance exercises issued in previous session last week by Lorita Rosa, PT.  Saw cardiologist yesterday (on 09-30-23) and states he was pleased with how he is doing; plans to start cardiac rehab in about 3 weeks but still doesn't know details yet   Pt accompanied by:  wife, [name]  PERTINENT HISTORY: hx of seizures, hx of left cerebellum stroke (March 2020) with residual imbalance/gait impairment and right-sided ataxia, TIA (July 2022), transient AMS possibly in setting of hypoglycemia, left-sided headaches, hypertension, hyperlipidemia, diabetes mellitus, h/o cervical fusion C5-7 (2012), h/o neck injury 2016, stage 3 CKD, and intracranial stenosis   Right and Left Heart Catheterization 09/11/2023: S/P primary angioplasty with coronary stent 09-11-23 Plans to start cardiac rehab soon (waiting for this to be scheduled)  PAIN:  Are you having pain?  C/o soreness/discomfort due to surgery  last week  PRECAUTIONS: Fall and Other: on Plavix   RED FLAGS: None   WEIGHT BEARING RESTRICTIONS: No  FALLS: Has patient fallen in last 6 months? Yes. Number of falls 3  LIVING ENVIRONMENT:  Lives with: lives with their spouse Lives in: House/apartment Stairs: Yes: External: 1 to porch, 1 to foyer steps; none can use ramp at back of house Has following equipment at home:  "I've got it all"  PLOF: Independent with basic ADLs, Independent with household mobility without device, Independent with community mobility without device, and Independent with transfers  PATIENT GOALS: Improve balance and walking  OBJECTIVE:  Note: Objective measures were completed at Evaluation unless otherwise noted.  DIAGNOSTIC FINDINGS: CT Angio Chest: 07-19-23 IMPRESSION: 1. No pulmonary embolus. 2. No acute intrathoracic abnormality. 3. Aortic Atherosclerosis (ICD10-I70.0) including three-vessel coronary artery and aortic valve leaflet calcifications-correlate for aortic stenosis. 4. Small hiatal hernia.  COGNITION: Overall cognitive status: Within functional limits for tasks  assessed   SENSATION: WFL  COORDINATION: WNL's bil. LE's  POSTURE: rounded shoulders, forward head, increased thoracic kyphosis, and flexed trunk   LOWER EXTREMITY ROM:  WNL's bil. LE's    LOWER EXTREMITY MMT:  WFL's   BED MOBILITY:  Findings: Independent   TRANSFERS: Sit to stand: Modified independence  Assistive device utilized: None      STAIRS: Used bil hand rails - step over step sequence  GAIT: Gait pattern: step through pattern, decreased arm swing- Right, decreased arm swing- Left, decreased step length- Right, decreased step length- Left, decreased hip/knee flexion- Right, decreased ankle dorsiflexion- Right, decreased ankle dorsiflexion- Left, trunk flexed, poor foot clearance- Right, and poor foot clearance- Left Distance walked: various clinical distances Assistive device utilized: None Level of assistance: SBA Comments:    FUNCTIONAL TESTS:  5 times sit to stand: 11.25 secs without UE from mat  Timed up and go (TUG): 10.43 secs without device 10 meter walk test: 11.65 secs = 2.82 ft/sec without device Functional gait assessment:  score 18/30          09/17/23 0001  Functional Gait  Assessment  Gait Level Surface 2 (6.54, 7.00 secs)  Change in Gait Speed 2  Gait with Horizontal Head Turns 2  Gait with Vertical Head Turns 1  Gait and Pivot Turn 3  Step Over Obstacle 3  Gait with Narrow Base of Support 0  Gait with Eyes Closed 1  Ambulating Backwards 2  Steps 2  Total Score 18     LLE SLS = 4.00 secs   RLE SLS = unable                                                                                                                    TREATMENT:  10-01-23     Vitals:   10/01/23 1342  Pulse: 94  SpO2: 96%   Gait:   115' x consecutive laps (230') without device - cues to increase Rt heel strike and increase step length; also cues more upright posture with increased Rt shoulder elevation and increase arm swing;  pt reports when he does swing his Rt  arm he feels he is losing his balance  TherEx:    3# weight for Rt hip flexor strengthening in standing; pt tapped Rt foot to 8" step placed inside // bars 10 reps, with bil. UE support Stepping over black balance beam with RLE with 3# weight 10 reps with cues for larger step length to clear beam  Step ups RLE 10 reps onto 1st step with use of handrails for assist with balance   NeuroRe-ed: Stepping strategy exercise by side of mat - no UE support used; pt performed forward stepping, sideways stepping and backwards stepping with LLE for improved SLS on RLE Rockerboard inside // bars - bil. UE support - 10 reps x 2 sets - 1st set with 2-3 finger support on // bars;  2nd set without UE support 10 reps with CGA Step down forward from rockerboard 5 reps each LE with bil. UE support on // bars  Pt reported not feeling well and feeling very fatigued after 30" activity; requested approx. 2-3 short seated rest periods during session; requested to end session early due to c/o fatigue and also  c/o onset of headache  PATIENT EDUCATION: Education details: revised HEP Person educated: Patient and Spouse Education method: Medical illustrator Education comprehension: verbalized understanding and returned demonstration  HOME EXERCISE PROGRAM: Access Code: [email or web address] URL: [email or web address] Date: 09/24/2023 Prepared by: Lorita Rosa  Exercises - Romberg Stance with Eyes Closed  - 1 x daily - 7 x weekly - 1 sets - 5 reps - 30 seconds hold - Wide Tandem Stance with Eyes Closed  - 1 x daily - 7 x weekly - 1 sets - 5 reps - 30 seconds hold - Single Leg Stance with Support  - 1 x daily - 7 x weekly - 1 sets - 5 reps - 30 sec hold   GOALS: Goals reviewed with patient? Yes  SHORT TERM GOALS: SAME AS LTG'S AS ELOS = 4 WEEKS   LONG TERM GOALS: Target date: 10-18-23  Pt will improve FGA score to >/= 24/30 to demonstrate reduced fall risk (fall risk < 22/30) and improved  dynamic balance.  Baseline: 18/30 Goal status: INITIAL  2.  Increase gait velocity to >/= 3.1 ft/sec for increased gait efficiency (no device). Baseline: 10 meter walk test: 11.65 secs = 2.82 ft/sec without device Goal status: INITIAL  3.  Pt will amb. 250' without device on flat, even surface demonstrating upright posture, and good RLE swing through (with hip and knee flexion) and arm swing during gait for less postural deviations.   Baseline: moderate postural deviations including trunk flexion, decreased Rt hip and knee flexion in swing and decreased Rt arm swing Goal status: INITIAL  4.  Pt will demonstrate improved balance by ability to stand on RLE for at least 2 secs to increase safety with stepping over objects. Baseline: unable to perform SLS on RLE  Goal status: INITIAL  5.  Independent in HEP for balance and strengthening.   Baseline:  Goal status: INITIAL   ASSESSMENT:  CLINICAL IMPRESSION: PT session focused on balance activities to improve SLS on RLE and strengthening exercises for Rt hip flexors with use of 3# weight.  Pt continues to have SLS deficits on RLE with need for UE support for safety.  Pt also continues to demonstrate gait deviations including Rt shoulder depression and decreased Rt arm swing with decreased Rt hip and knee flexion in swing phase of gait.  Pt had decreased activity tolerance in today's session with c/o fatigue and onset of HA with request to end session early after 30" activity.  Continue POC.     OBJECTIVE IMPAIRMENTS: Abnormal gait, decreased activity tolerance, decreased balance, decreased endurance, decreased strength, dizziness, and postural dysfunction.   ACTIVITY LIMITATIONS: carrying, lifting, bending, squatting, stairs, and locomotion level  PARTICIPATION LIMITATIONS: meal prep, cleaning, laundry, driving, shopping, community activity, and yard work  PERSONAL FACTORS: Past/current experiences and 1-2 comorbidities: h/o CVA, h/o  seizures and recent heart catheterization   are also affecting patient's functional outcome.   REHAB POTENTIAL: Good  CLINICAL DECISION MAKING: Evolving/moderate complexity  EVALUATION COMPLEXITY: Moderate  PLAN:  PT FREQUENCY: 2x/week for 2 weeks, then 1x/wk for 2 weeks = 7 visits + eval  PT DURATION: 4 weeks   PLANNED INTERVENTIONS: 97110-Therapeutic exercises, 97530- Therapeutic activity, 97112- Neuromuscular re-education, 97535- Self Care, and 16109- Gait training  PLAN FOR NEXT SESSION: dynamic balance and gait training, increasing step length and step height, work on balance reactions (cardiac rehab to start in approx. 3 weeks)   Serai Tukes Suzanne, PT   10/01/2023, 2:20 PM

## 2023-10-02 LAB — BASIC METABOLIC PANEL WITH GFR
BUN/Creatinine Ratio: 13 (ref 10–24)
BUN: 18 mg/dL (ref 8–27)
Calcium: 9.6 mg/dL (ref 8.6–10.2)
Chloride: 106 mmol/L (ref 96–106)
Creatinine, Ser: 1.44 mg/dL — ABNORMAL HIGH (ref 0.76–1.27)
Glucose: 61 mg/dL — ABNORMAL LOW (ref 70–99)
Potassium: 3.9 mmol/L (ref 3.5–5.2)
Sodium: 142 mmol/L (ref 134–144)
eGFR: 51 mL/min/{1.73_m2} — ABNORMAL LOW (ref >59)

## 2023-10-02 LAB — LIPID PANEL
Chol/HDL Ratio: 3.1 ratio (ref 0.0–5.0)
Cholesterol, Total: 134 mg/dL (ref 100–199)
HDL: 43 mg/dL (ref >39)
LDL Chol Calc (NIH): 62 mg/dL (ref 0–99)
Triglycerides: 170 mg/dL — ABNORMAL HIGH (ref 0–149)
VLDL Cholesterol Cal: 29 mg/dL (ref 5–40)

## 2023-10-03 ENCOUNTER — Ambulatory Visit: Admitting: Physical Therapy

## 2023-10-08 ENCOUNTER — Ambulatory Visit: Attending: Adult Health | Admitting: Physical Therapy

## 2023-10-08 DIAGNOSIS — R2689 Other abnormalities of gait and mobility: Secondary | ICD-10-CM | POA: Insufficient documentation

## 2023-10-08 DIAGNOSIS — M6281 Muscle weakness (generalized): Secondary | ICD-10-CM | POA: Diagnosis present

## 2023-10-08 DIAGNOSIS — R293 Abnormal posture: Secondary | ICD-10-CM | POA: Diagnosis not present

## 2023-10-08 DIAGNOSIS — R2681 Unsteadiness on feet: Secondary | ICD-10-CM | POA: Diagnosis present

## 2023-10-08 NOTE — Therapy (Unsigned)
 OUTPATIENT PHYSICAL THERAPY NEURO TREATMENT   Patient Name: Jeff [name] MRN: [id number] DOB:[birth year], 73 y.o., male Today's Date: 10/09/2023   PCP: [facility], [facility] REFERRING PROVIDER: Johny Nap, NP  END OF SESSION:  PT End of Session - 10/09/23 0858     Visit Number 4    Number of Visits 7   2x/wk x 2 wks, then 1x/wk x 2 wks = 6 visits   Date for PT Re-Evaluation 10/18/23    Authorization Type [employer] CHO[employer] PPO    Authorization Time Period 09-17-23 - 11-17-23    PT Start Time 1531    PT Stop Time 1615    PT Time Calculation (min) 44 min    Equipment Utilized During Treatment Gait belt    Activity Tolerance Treatment limited secondary to medical complications (Comment);Other (comment)   pt having swelling in bil. feet with c/o pain with walking   Behavior During Therapy WFL for tasks assessed/performed                Past Medical History:  Diagnosis Date   ADHD (attention deficit hyperactivity disorder)    CVA (cerebral vascular accident) (HCC) 2022   Diabetes mellitus    Hyperlipidemia    Hypertension    Renal disorder    Seizures (HCC)    Stroke [facility])    Past Surgical History:  Procedure Laterality Date   CORONARY LITHOTRIPSY N/A 09/11/2023   Procedure: CORONARY LITHOTRIPSY;  Surgeon: Knox Perl, MD;  Location: [facility] INVASIVE CV LAB;  Service: Cardiovascular;  Laterality: N/A;   CORONARY STENT INTERVENTION N/A 09/11/2023   Procedure: CORONARY STENT INTERVENTION;  Surgeon: Knox Perl, MD;  Location: [facility] INVASIVE CV LAB;  Service: Cardiovascular;  Laterality: N/A;   IR ANGIO INTRA EXTRACRAN SEL COM CAROTID INNOMINATE BILAT MOD SED  01/03/2021   IR ANGIO VERTEBRAL SEL SUBCLAVIAN INNOMINATE UNI L MOD SED  01/03/2021   IR ANGIO VERTEBRAL SEL VERTEBRAL UNI R MOD SED  01/03/2021   IR RADIOLOGIST EVAL & MGMT  01/13/2021   IR US  GUIDE VASC ACCESS RIGHT  01/03/2021   NECK SURGERY     RIGHT/LEFT HEART CATH AND CORONARY ANGIOGRAPHY N/A 09/11/2023   Procedure:  RIGHT/LEFT HEART CATH AND CORONARY ANGIOGRAPHY;  Surgeon: Knox Perl, MD;  Location: [facility] INVASIVE CV LAB;  Service: Cardiovascular;  Laterality: N/A;   SPINE SURGERY     Cervical spine x 2; [facility]; [facility].   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 (HCC) 08/12/2018   Stroke (HCC) 08/08/2018   Diabetes mellitus (HCC) 07/25/2015   Neck injury 06/22/2015   Hearing loss in right ear 10/14/2011   Agent orange exposure 10/14/2011   ADD (attention deficit disorder) 10/14/2011   Hyperlipidemia 10/14/2011   BMI 28.0-28.9,adult 10/14/2011    ONSET DATE: Referral date 08-26-23:  09-11-23 for heart catheterization & coronary stent  REFERRING DIAG: R26.9 (ICD-10-CM) - Gait abnormality  THERAPY DIAG:  Other abnormalities of gait and  mobility  Unsteadiness on feet  Rationale for Evaluation and Treatment: Rehabilitation  SUBJECTIVE:                                                                                                                                                                                             SUBJECTIVE STATEMENT: Pt cancelled last Thursday's PT appt due to having a migraine; went to [facility] earlier today due to having swelling in his bil. LE's; wife reports they increased Lasix  to one tablet a day (was previously 1/2 tablet prn);  pt reports he is having pain in his feet  when he walks - started to cancel today's appt but was afraid he could be discharged due to missing too many appts.  Requests not to do any walking activities in today's session  Pt accompanied by:  wife, [name]  PERTINENT HISTORY: hx of seizures, hx of left cerebellum stroke (March 2020) with residual imbalance/gait impairment and right-sided ataxia, TIA (July 2022), transient AMS possibly in setting of hypoglycemia, left-sided headaches, hypertension, hyperlipidemia, diabetes mellitus, h/o cervical fusion C5-7 (2012), h/o neck injury 2016, stage 3 CKD, and intracranial stenosis   Right and Left Heart Catheterization 09/11/2023: S/P primary angioplasty with coronary stent 09-11-23 Plans to start cardiac rehab soon (waiting for this to be scheduled)  PAIN:  Are you having pain?  C/o soreness/discomfort due to surgery  last week 2-3/10 in seated; 3-4  standing  PRECAUTIONS: Fall and Other: on Plavix   RED FLAGS: None   WEIGHT BEARING RESTRICTIONS: No  FALLS: Has patient fallen in last 6 months? Yes. Number of falls 3  LIVING ENVIRONMENT:  Lives with: lives with their spouse Lives in: House/apartment Stairs: Yes: External: 1 to porch, 1 to foyer steps; none can use ramp at back of house Has following equipment at home:  "I've got it all"  PLOF: Independent with basic ADLs, Independent with household mobility without device, Independent with community mobility without device, and Independent with transfers  PATIENT GOALS: Improve balance and walking  OBJECTIVE:  Note: Objective measures were completed at Evaluation unless otherwise noted.  DIAGNOSTIC FINDINGS: CT Angio Chest: 07-19-23 IMPRESSION: 1. No pulmonary embolus. 2. No acute intrathoracic abnormality. 3. Aortic Atherosclerosis (ICD10-I70.0) including three-vessel coronary artery and aortic valve leaflet calcifications-correlate for aortic stenosis. 4. Small hiatal hernia.  COGNITION: Overall cognitive status: Within  functional limits for tasks assessed   SENSATION: WFL  COORDINATION: WNL's bil. LE's  POSTURE: rounded shoulders, forward head, increased thoracic kyphosis, and flexed trunk   LOWER EXTREMITY ROM:  WNL's bil. LE's    LOWER EXTREMITY MMT:  WFL's   BED  MOBILITY:  Findings: Independent   TRANSFERS: Sit to stand: Modified independence  Assistive device utilized: None      STAIRS: Used bil hand rails - step over step sequence  GAIT: Gait pattern: step through pattern, decreased arm swing- Right, decreased arm swing- Left, decreased step length- Right, decreased step length- Left, decreased hip/knee flexion- Right, decreased ankle dorsiflexion- Right, decreased ankle dorsiflexion- Left, trunk flexed, poor foot clearance- Right, and poor foot clearance- Left Distance walked: various clinical distances Assistive device utilized: None Level of assistance: SBA Comments:    FUNCTIONAL TESTS:  5 times sit to stand: 11.25 secs without UE from mat  Timed up and go (TUG): 10.43 secs without device 10 meter walk test: 11.65 secs = 2.82 ft/sec without device Functional gait assessment:  score 18/30          09/17/23 0001  Functional Gait  Assessment  Gait Level Surface 2 (6.54, 7.00 secs)  Change in Gait Speed 2  Gait with Horizontal Head Turns 2  Gait with Vertical Head Turns 1  Gait and Pivot Turn 3  Step Over Obstacle 3  Gait with Narrow Base of Support 0  Gait with Eyes Closed 1  Ambulating Backwards 2  Steps 2  Total Score 18     LLE SLS = 4.00 secs   RLE SLS = unable                                                                                                                    TREATMENT:  10-08-23  TherEx: Pt instructed in ankle pumps in seated position to assist with increasing circulation in bil. LE's - pt performed 10 reps - instructed to perform 10-15 reps every 2-3 hours  Pt performed sit to stand transfers from mat table 5 reps without UE support - no LOB with  this activity and no c/o increased pain in feet  Heel raises at counter with min. bil. UE support - 10 reps bil. LE's;  10 reps RLE with increased bil. UE support due to difficulty due to plantarflexor weakness  NeuroRe-ed: Balance activities performed inside // bars for UE support prn:  Rockerboard inside // bars - bil. UE support - 10 reps x 2 sets - with UE support prn with CGA Step down forward from rockerboard 5 reps each LE with bil. UE support on // bars; stepped down backward from rockerboard 5 reps each leg with bil. UE support Alternate tap ups to 6" step placed inside // bars 10 reps each LE with CGA - attempted this activity without UE support  Standing on floor with feet together - EC - 10 secs - no head turns; progressed to performing slow horizontal head turns 5 reps - CGA  Pt performed sidestepping on blue foam balance beam inside // bars 2 laps (10' x 4 reps) with UE support on // bars prn Performed tandem walking on blue foam beam 1 lap with space between feet to decreased difficulty with this activity  Touching  targets (3 stepping stones) 5 reps with each foot in 1/2 circular pattern; progressed to using 2 stepping stones - performed diagonal touches 5 reps with each foot with UE support prn with CGA  Stepping over and back of black balance beam 5 reps each LE with CGA    PATIENT EDUCATION: Education details: revised HEP Person educated: Patient and Spouse Education method: Medical illustrator Education comprehension: verbalized understanding and returned demonstration  HOME EXERCISE PROGRAM: Access Code: [email or web address] URL: [email or web address] Date: 09/24/2023 Prepared by: Lorita Rosa  Exercises - Romberg Stance with Eyes Closed  - 1 x daily - 7 x weekly - 1 sets - 5 reps - 30 seconds hold - Wide Tandem Stance with Eyes Closed  - 1 x daily - 7 x weekly - 1 sets - 5 reps - 30 seconds hold - Single Leg Stance with Support  - 1 x daily - 7  x weekly - 1 sets - 5 reps - 30 sec hold   GOALS: Goals reviewed with patient? Yes  SHORT TERM GOALS: SAME AS LTG'S AS ELOS = 4 WEEKS   LONG TERM GOALS: Target date: 10-18-23  Pt will improve FGA score to >/= 24/30 to demonstrate reduced fall risk (fall risk < 22/30) and improved dynamic balance.  Baseline: 18/30 Goal status: INITIAL  2.  Increase gait velocity to >/= 3.1 ft/sec for increased gait efficiency (no device). Baseline: 10 meter walk test: 11.65 secs = 2.82 ft/sec without device Goal status: INITIAL  3.  Pt will amb. 250' without device on flat, even surface demonstrating upright posture, and good RLE swing through (with hip and knee flexion) and arm swing during gait for less postural deviations.   Baseline: moderate postural deviations including trunk flexion, decreased Rt hip and knee flexion in swing and decreased Rt arm swing Goal status: INITIAL  4.  Pt will demonstrate improved balance by ability to stand on RLE for at least 2 secs to increase safety with stepping over objects. Baseline: unable to perform SLS on RLE  Goal status: INITIAL  5.  Independent in HEP for balance and strengthening.   Baseline:  Goal status: INITIAL   ASSESSMENT:  CLINICAL IMPRESSION: PT session focused on static standing balance activities to improve SLS on each leg.  Pt requested to not perform walking activities in today's session due to swelling and pain in bil. Feet which he stated was much worse with walking and less with static standing. Pt continues to need intermittent UE support due to SLS deficits >RLE than LLE.  Pt tolerated standing exercises well with no increase in pain in feet reported and only 2 short seated rest breaks needed during session.  Continue POC.     OBJECTIVE IMPAIRMENTS: Abnormal gait, decreased activity tolerance, decreased balance, decreased endurance, decreased strength, dizziness, and postural dysfunction.   ACTIVITY LIMITATIONS: carrying, lifting,  bending, squatting, stairs, and locomotion level  PARTICIPATION LIMITATIONS: meal prep, cleaning, laundry, driving, shopping, community activity, and yard work  PERSONAL FACTORS: Past/current experiences and 1-2 comorbidities: h/o CVA, h/o seizures and recent heart catheterization   are also affecting patient's functional outcome.   REHAB POTENTIAL: Good  CLINICAL DECISION MAKING: Evolving/moderate complexity  EVALUATION COMPLEXITY: Moderate  PLAN:  PT FREQUENCY: 2x/week for 2 weeks, then 1x/wk for 2 weeks = 7 visits + eval  PT DURATION: 4 weeks   PLANNED INTERVENTIONS: 97110-Therapeutic exercises, 97530- Therapeutic activity, [id number]- Neuromuscular re-education, 97535- Self Care, and 96045- Gait training  PLAN FOR NEXT SESSION: dynamic balance  and gait training, increasing step length and step height, work on balance reactions (cardiac rehab to start in approx. 3 weeks)   Dorsie Gaunt, PT   10/09/2023, 9:00 AM

## 2023-10-09 ENCOUNTER — Encounter: Payer: Self-pay | Admitting: Physical Therapy

## 2023-10-15 ENCOUNTER — Ambulatory Visit: Admitting: Physical Therapy

## 2023-10-15 DIAGNOSIS — R2689 Other abnormalities of gait and mobility: Secondary | ICD-10-CM | POA: Diagnosis not present

## 2023-10-15 DIAGNOSIS — M6281 Muscle weakness (generalized): Secondary | ICD-10-CM | POA: Diagnosis not present

## 2023-10-15 DIAGNOSIS — R293 Abnormal posture: Secondary | ICD-10-CM | POA: Diagnosis not present

## 2023-10-15 DIAGNOSIS — R2681 Unsteadiness on feet: Secondary | ICD-10-CM | POA: Diagnosis not present

## 2023-10-15 NOTE — Therapy (Unsigned)
 OUTPATIENT PHYSICAL THERAPY NEURO TREATMENT   Patient Name: Jeff [name] MRN: [id number] DOB:10/15/1950, 73 y.o., male Today's Date: 10/16/2023   PCP: [facility], [facility] REFERRING PROVIDER: Johny Nap, NP  END OF SESSION:  PT End of Session - 10/16/23 1404     Visit Number 5    Number of Visits 7   2x/wk x 2 wks, then 1x/wk x 2 wks = 6 visits   Date for PT Re-Evaluation 10/18/23    Authorization Type [employer] CHO[employer] PPO    Authorization Time Period 09-17-23 - 11-17-23    Authorization - Visit Number 5    Authorization - Number of Visits 7   7 visits approved 4-15 - 6-15   PT Start Time 1530    PT Stop Time 1616    PT Time Calculation (min) 46 min    Equipment Utilized During Treatment Gait belt    Activity Tolerance Patient tolerated treatment well   pt having swelling in bil. feet with c/o pain with walking   Behavior During Therapy WFL for tasks assessed/performed                 Past Medical History:  Diagnosis Date   ADHD (attention deficit hyperactivity disorder)    CVA (cerebral vascular accident) (HCC) 2022   Diabetes mellitus    Hyperlipidemia    Hypertension    Renal disorder    Seizures (HCC)    Stroke [facility])    Past Surgical History:  Procedure Laterality Date   CORONARY LITHOTRIPSY N/A 09/11/2023   Procedure: CORONARY LITHOTRIPSY;  Surgeon: Knox Perl, MD;  Location: [facility] INVASIVE CV LAB;  Service: Cardiovascular;  Laterality: N/A;   CORONARY STENT INTERVENTION N/A 09/11/2023   Procedure: CORONARY STENT INTERVENTION;  Surgeon: Knox Perl, MD;  Location: [facility] INVASIVE CV LAB;  Service: Cardiovascular;  Laterality: N/A;   IR ANGIO INTRA EXTRACRAN SEL COM CAROTID INNOMINATE BILAT MOD SED  01/03/2021   IR ANGIO VERTEBRAL SEL SUBCLAVIAN INNOMINATE UNI L MOD SED  01/03/2021   IR ANGIO VERTEBRAL SEL VERTEBRAL UNI R MOD SED  01/03/2021   IR RADIOLOGIST EVAL & MGMT  01/13/2021   IR US  GUIDE VASC ACCESS RIGHT  01/03/2021   NECK SURGERY     RIGHT/LEFT HEART  CATH AND CORONARY ANGIOGRAPHY N/A 09/11/2023   Procedure: RIGHT/LEFT HEART CATH AND CORONARY ANGIOGRAPHY;  Surgeon: Knox Perl, MD;  Location: [facility] INVASIVE CV LAB;  Service: Cardiovascular;  Laterality: N/A;   SPINE SURGERY     Cervical spine x 2; [facility]et; [facility].   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 (HCC) 08/12/2018   Stroke (HCC) 08/08/2018   Diabetes mellitus (HCC) 07/25/2015   Neck injury 06/22/2015   Hearing loss in right ear 10/14/2011   Agent orange exposure 10/14/2011   ADD (attention deficit disorder) 10/14/2011   Hyperlipidemia 10/14/2011   BMI 28.0-28.9,adult 10/14/2011    ONSET DATE: Referral date 08-26-23:  09-11-23 for heart  catheterization & coronary stent  REFERRING DIAG: R26.9 (ICD-10-CM) - Gait abnormality  THERAPY DIAG:  Other abnormalities of gait and mobility  Unsteadiness on feet  Muscle weakness (generalized)  Rationale for Evaluation and Treatment: Rehabilitation  SUBJECTIVE:                                                                                                                                                                                             SUBJECTIVE STATEMENT: Pt reports he has been taking the increased Lasix  dosage as instructed and swelling in LE's is much improved;  pt states he feels the best today that he has felt in quite some time.   Still has not heard from cardiac rehab  Pt accompanied by: wife, [name]  PERTINENT HISTORY: hx of seizures, hx of left cerebellum stroke (March 2020) with residual imbalance/gait impairment and right-sided ataxia, TIA (July 2022), transient AMS possibly in setting of hypoglycemia, left-sided headaches, hypertension, hyperlipidemia, diabetes mellitus, h/o cervical fusion C5-7 (2012), h/o neck injury 2016, stage 3 CKD, and intracranial stenosis   Right and Left Heart Catheterization 09/11/2023: S/P primary angioplasty with coronary stent 09-11-23 Plans to start cardiac rehab soon (waiting for this to be scheduled)  PAIN:  Are you having pain? No   PRECAUTIONS: Fall and Other: on Plavix   RED FLAGS: None   WEIGHT BEARING RESTRICTIONS: No  FALLS: Has patient fallen in last 6 months? Yes. Number of falls 3  LIVING ENVIRONMENT:  Lives with: lives with their spouse Lives in: House/apartment Stairs: Yes: External: 1 to porch, 1 to foyer steps; none can use ramp at back of house Has following equipment at home:  "I've got it all"  PLOF: Independent with basic ADLs, Independent with household mobility without device, Independent with community mobility without device, and Independent with transfers  PATIENT GOALS: Improve balance and walking  OBJECTIVE:  Note: Objective measures were completed at Evaluation unless otherwise noted.  DIAGNOSTIC FINDINGS: CT Angio Chest: 07-19-23 IMPRESSION: 1. No pulmonary embolus. 2. No acute intrathoracic abnormality. 3. Aortic Atherosclerosis (ICD10-I70.0) including three-vessel coronary artery and aortic valve leaflet calcifications-correlate for aortic stenosis. 4. Small hiatal hernia.  COGNITION: Overall cognitive status: Within functional limits for tasks assessed   SENSATION: WFL  COORDINATION: WNL's bil. LE's  POSTURE: rounded shoulders, forward head, increased thoracic kyphosis, and flexed trunk   LOWER EXTREMITY ROM:  WNL's bil. LE's     LOWER EXTREMITY MMT:  WFL's   BED MOBILITY:  Findings: Independent   TRANSFERS: Sit to stand: Modified independence  Assistive device utilized: None      STAIRS: Used bil hand rails - step over  step sequence  GAIT: Gait pattern: step through pattern, decreased arm swing- Right, decreased arm swing- Left, decreased step length- Right, decreased step length- Left, decreased hip/knee flexion- Right, decreased ankle dorsiflexion- Right, decreased ankle dorsiflexion- Left, trunk flexed, poor foot clearance- Right, and poor foot clearance- Left Distance walked: various clinical distances Assistive device utilized: None Level of assistance: SBA Comments:    FUNCTIONAL TESTS:  5 times sit to stand: 11.25 secs without UE from mat  Timed up and go (TUG): 10.43 secs without device 10 meter walk test: 11.65 secs = 2.82 ft/sec without device Functional gait assessment:  score 18/30          09/17/23 0001  Functional Gait  Assessment  Gait Level Surface 2 (6.54, 7.00 secs)  Change in Gait Speed 2  Gait with Horizontal Head Turns 2  Gait with Vertical Head Turns 1  Gait and Pivot Turn 3  Step Over Obstacle 3  Gait with Narrow Base of Support 0  Gait with Eyes Closed 1  Ambulating Backwards 2  Steps 2  Total Score 18    LLE SLS = 4.00 secs   RLE SLS = unable                                                                                                                    TREATMENT:  10-15-23  TherEx: Sit to stand from mat table without UE support 2 reps  RLE Step up exercise onto 6" step with UE support prn for balance recovery - 10 reps - cues to place Rt foot fully on step; CGA intermittently for balance recovery with bringing foot off step and back down to floor  Squats 10 reps without UE support  NeuroRe-ed: Balance activities performed inside // bars for UE support prn:  Stepping over and back of black balance beam 5 reps each LE with CGA  Rockerboard inside // bars  - bil. UE support - 10 reps x 2 sets - with UE support prn with CGA  Step down forward from rockerboard 5 reps each LE with bil. UE support on // bars; stepped down backward from rockerboard 5 reps each leg with bil. UE support  Standing on rockerboard - pt touched 2 balance bubbles 5 reps with each foot - straight ahead (no diagonal touches)  Standing on floor with feet together - pt performed 3 horizontal head turns - then reported dizziness provoked so this exercise was discontinued   Pt performed sidestepping on blue foam balance beam inside // bars 2 laps (10' x 4 reps) with UE support on // bars prn  Performed tandem walking on blue foam beam 1 lap with space between feet to decreased difficulty with this activity  Cone taps to 2 cones - straight ahead 3 reps each foot; 3 reps diagonal touches with UE support prn  Pt amb. 35' x 2 reps tossing and catching ball - forwards/backwards -- with CGA for improved balance with multi-tasking with gait    PATIENT EDUCATION:  Education details: revised HEP Person educated: Patient and Spouse Education method: Medical illustrator Education comprehension: verbalized understanding and returned demonstration  HOME EXERCISE PROGRAM: Access Code: [email or web address] URL: [email or web address] Date: 09/24/2023 Prepared by: Lorita Rosa  Exercises - Romberg Stance with Eyes Closed  - 1 x daily - 7 x weekly - 1 sets - 5 reps - 30 seconds hold - Wide Tandem Stance with Eyes Closed  - 1 x daily - 7 x weekly - 1 sets - 5 reps - 30 seconds hold - Single Leg Stance with Support  - 1 x daily - 7 x weekly - 1 sets - 5 reps - 30 sec hold   GOALS: Goals reviewed with patient? Yes  SHORT TERM GOALS: SAME AS LTG'S AS ELOS = 4 WEEKS   LONG TERM GOALS: Target date: 10-18-23  Pt will improve FGA score to >/= 24/30 to demonstrate reduced fall risk (fall risk < 22/30) and improved dynamic balance.  Baseline: 18/30 Goal status:  INITIAL  2.  Increase gait velocity to >/= 3.1 ft/sec for increased gait efficiency (no device). Baseline: 10 meter walk test: 11.65 secs = 2.82 ft/sec without device Goal status: INITIAL  3.  Pt will amb. 250' without device on flat, even surface demonstrating upright posture, and good RLE swing through (with hip and knee flexion) and arm swing during gait for less postural deviations.   Baseline: moderate postural deviations including trunk flexion, decreased Rt hip and knee flexion in swing and decreased Rt arm swing Goal status: INITIAL  4.  Pt will demonstrate improved balance by ability to stand on RLE for at least 2 secs to increase safety with stepping over objects. Baseline: unable to perform SLS on RLE  Goal status: INITIAL  5.  Independent in HEP for balance and strengthening.   Baseline:  Goal status: INITIAL   ASSESSMENT:  CLINICAL IMPRESSION: PT session focused on static and dynamic standing balance activities to improve SLS on each leg and also on RLE strengthening exercises.  Pt reporting feeling much better overall in today's session compared to that in previous PT sessions.  Pt able to perform exercises and balance activities with few rest breaks in 1st half of session, with more frequent rest breaks needed in 2nd half of session due to c/o fatigue with activity.  Pt is progressing well towards LTG's.  Continue POC.     OBJECTIVE IMPAIRMENTS: Abnormal gait, decreased activity tolerance, decreased balance, decreased endurance, decreased strength, dizziness, and postural dysfunction.   ACTIVITY LIMITATIONS: carrying, lifting, bending, squatting, stairs, and locomotion level  PARTICIPATION LIMITATIONS: meal prep, cleaning, laundry, driving, shopping, community activity, and yard work  PERSONAL FACTORS: Past/current experiences and 1-2 comorbidities: h/o CVA, h/o seizures and recent heart catheterization  are also affecting patient's functional outcome.   REHAB  POTENTIAL: Good  CLINICAL DECISION MAKING: Evolving/moderate complexity  EVALUATION COMPLEXITY: Moderate  PLAN:  PT FREQUENCY: 2x/week for 2 weeks, then 1x/wk for 2 weeks = 7 visits + eval  PT DURATION: 4 weeks   PLANNED INTERVENTIONS: 97110-Therapeutic exercises, 97530- Therapeutic activity, [id number]- Neuromuscular re-education, 97535- Self Care, and [id number]- Gait training  PLAN FOR NEXT SESSION: Does pt want additional visit authorized?  I told him I did not think it was really needed due to his progress but if he wanted it he could schedule 1 more session.  D/C if pt agrees to finish up at next appt - if not, continue with dynamic balance and gait training, increasing step length and step height,  work on balance reactions (cardiac rehab to start in approx. 3 weeks)   Dorsie Gaunt, PT   10/16/2023, 2:06 PM

## 2023-10-16 ENCOUNTER — Encounter: Payer: Self-pay | Admitting: Physical Therapy

## 2023-10-22 ENCOUNTER — Ambulatory Visit: Admitting: Physical Therapy

## 2023-10-22 DIAGNOSIS — R2681 Unsteadiness on feet: Secondary | ICD-10-CM | POA: Diagnosis not present

## 2023-10-22 DIAGNOSIS — R2689 Other abnormalities of gait and mobility: Secondary | ICD-10-CM | POA: Diagnosis not present

## 2023-10-22 DIAGNOSIS — R293 Abnormal posture: Secondary | ICD-10-CM

## 2023-10-22 DIAGNOSIS — M6281 Muscle weakness (generalized): Secondary | ICD-10-CM | POA: Diagnosis not present

## 2023-10-22 NOTE — Therapy (Signed)
 OUTPATIENT PHYSICAL THERAPY NEURO TREATMENT - RECERTIFICATION AND DISCHARGE NOTE   Patient Name: Jeff [name] MRN: [id number] DOB:[birth year], 73 y.o., male Today's Date: 10/22/2023   PCP: Clinic, [place] REFERRING PROVIDER: Johny Nap, NP  PHYSICAL THERAPY DISCHARGE SUMMARY  Visits from Start of Care: 6  Current functional level related to goals / functional outcomes: Mod I   Remaining deficits: Ongoing balance impairments   Education / Equipment: Handout for final HEP   Patient agrees to discharge. Patient goals were partially met. Patient is being discharged due to maximized rehab potential.    END OF SESSION:  PT End of Session - 10/22/23 1535     Visit Number 6    Number of Visits 7   2x/wk x 2 wks, then 1x/wk x 2 wks = 6 visits   Date for PT Re-Evaluation 10/22/23   recert to cover last scheduled PT visit   Authorization Type [employer] MEDICARE CHOICE PPO    Authorization Time Period 09-17-23 - 11-17-23    Authorization - Number of Visits 7   7 visits approved 4-15 - 6-15   PT Start Time 1530    PT Stop Time 1619    PT Time Calculation (min) 49 min    Equipment Utilized During Treatment Gait belt    Activity Tolerance Patient tolerated treatment well   pt having swelling in bil. feet with c/o pain with walking   Behavior During Therapy WFL for tasks assessed/performed                  Past Medical History:  Diagnosis Date   ADHD (attention deficit hyperactivity disorder)    CVA (cerebral vascular accident) (HCC) 2022   Diabetes mellitus    Hyperlipidemia    Hypertension    Renal disorder    Seizures (HCC)    Stroke [facility])    Past Surgical History:  Procedure Laterality Date   CORONARY LITHOTRIPSY N/A 09/11/2023   Procedure: CORONARY LITHOTRIPSY;  Surgeon: Knox Perl, MD;  Location: [facility] INVASIVE CV LAB;  Service: Cardiovascular;  Laterality: N/A;   CORONARY STENT INTERVENTION N/A 09/11/2023   Procedure: CORONARY STENT INTERVENTION;  Surgeon:  Knox Perl, MD;  Location: [facility] INVASIVE CV LAB;  Service: Cardiovascular;  Laterality: N/A;   IR ANGIO INTRA EXTRACRAN SEL COM CAROTID INNOMINATE BILAT MOD SED  01/03/2021   IR ANGIO VERTEBRAL SEL SUBCLAVIAN INNOMINATE UNI L MOD SED  01/03/2021   IR ANGIO VERTEBRAL SEL VERTEBRAL UNI R MOD SED  01/03/2021   IR RADIOLOGIST EVAL & MGMT  01/13/2021   IR US  GUIDE VASC ACCESS RIGHT  01/03/2021   NECK SURGERY     RIGHT/LEFT HEART CATH AND CORONARY ANGIOGRAPHY N/A 09/11/2023   Procedure: RIGHT/LEFT HEART CATH AND CORONARY ANGIOGRAPHY;  Surgeon: Knox Perl, MD;  Location: [facility] INVASIVE CV LAB;  Service: Cardiovascular;  Laterality: N/A;   SPINE SURGERY     Cervical spine x 2; Jeff [name]; Jeff [name].   Patient Active Problem List   Diagnosis Date Noted   S/P primary angioplasty with coronary stent 09/11/2023   Benign essential hypertension 11/20/2022   Hypomagnesemia 11/20/2022   Falls 11/20/2022   Obstructive sleep apnea (adult) (pediatric) 11/20/2022   Syncope and collapse 11/20/2022   Uncontrolled type 2 diabetes mellitus with hypoglycemia, with long-term current use of insulin  (HCC) 11/20/2022   Acute encephalopathy 11/20/2022   Hypoglycemia 11/20/2022   TIA (transient ischemic attack) 03/10/2020   CKD (chronic kidney disease) 08/28/2018   Retinopathy of both eyes 08/28/2018  Stroke due to embolism of left vertebral artery (HCC) 08/12/2018   Stroke (HCC) 08/08/2018   Diabetes mellitus (HCC) 07/25/2015   Neck injury 06/22/2015   Hearing loss in right ear 10/14/2011   Agent orange exposure 10/14/2011   ADD (attention deficit disorder) 10/14/2011   Hyperlipidemia 10/14/2011   BMI 28.0-28.9,adult 10/14/2011    ONSET DATE: Referral date 08-26-23:  09-11-23 for heart catheterization & coronary stent  REFERRING DIAG: R26.9 (ICD-10-CM) - Gait abnormality  THERAPY DIAG:  Other abnormalities of gait and mobility  Unsteadiness on feet  Muscle weakness (generalized)  Abnormal posture  Rationale for  Evaluation and Treatment: Rehabilitation  SUBJECTIVE:                                                                                                                                                                                             SUBJECTIVE STATEMENT:  At beginning of session patient's BG is 103 and trending down, pt's wife able to provide snacks, drops to 96 then drinks coke and eventually comes back up to 140. Pt continues to work on his HEP in his shower at home.   Pt accompanied by: wife, Jeff [name]  PERTINENT HISTORY: hx of seizures, hx of left cerebellum stroke (March 2020) with residual imbalance/gait impairment and right-sided ataxia, TIA (July 2022), transient AMS possibly in setting of hypoglycemia, left-sided headaches, hypertension, hyperlipidemia, diabetes mellitus, h/o cervical fusion C5-7 (2012), h/o neck injury 2016, stage 3 CKD, and intracranial stenosis   Right and Left Heart Catheterization 09/11/2023: S/P primary angioplasty with coronary stent 09-11-23 Plans to start cardiac rehab soon (waiting for this to be scheduled)  PAIN:  Are you having pain? No   PRECAUTIONS: Fall and Other: on Plavix   RED FLAGS: None   WEIGHT BEARING RESTRICTIONS: No  FALLS: Has patient fallen in last 6 months? Yes. Number of falls 3  LIVING ENVIRONMENT:  Lives with: lives with their spouse Lives in: House/apartment Stairs: Yes: External: 1 to porch, 1 to foyer steps; none can use ramp at back of house Has following equipment at home:  "I've got it all"  PLOF: Independent with basic ADLs, Independent with household mobility without device, Independent with community mobility without device, and Independent with transfers  PATIENT GOALS: Improve balance and walking  OBJECTIVE:  Note: Objective measures were completed at Evaluation unless otherwise noted.  DIAGNOSTIC FINDINGS: CT Angio Chest: 07-19-23 IMPRESSION: 1. No pulmonary embolus. 2. No acute intrathoracic  abnormality. 3. Aortic Atherosclerosis (ICD10-I70.0) including three-vessel coronary artery and aortic valve leaflet calcifications-correlate for aortic stenosis. 4. Small hiatal hernia.  COGNITION: Overall cognitive status: Within functional limits for tasks  assessed   SENSATION: WFL  COORDINATION: WNL's bil. LE's  POSTURE: rounded shoulders, forward head, increased thoracic kyphosis, and flexed trunk   LOWER EXTREMITY ROM:  WNL's bil. LE's    LOWER EXTREMITY MMT:  WFL's   BED MOBILITY:  Findings: Independent   TRANSFERS: Sit to stand: Modified independence  Assistive device utilized: None      STAIRS: Used bil hand rails - step over step sequence  GAIT: Gait pattern: step through pattern, decreased arm swing- Right, decreased arm swing- Left, decreased step length- Right, decreased step length- Left, decreased hip/knee flexion- Right, decreased ankle dorsiflexion- Right, decreased ankle dorsiflexion- Left, trunk flexed, poor foot clearance- Right, and poor foot clearance- Left Distance walked: various clinical distances Assistive device utilized: None Level of assistance: SBA Comments:    FUNCTIONAL TESTS:  5 times sit to stand: 11.25 secs without UE from mat  Timed up and go (TUG): 10.43 secs without device 10 meter walk test: 11.65 secs = 2.82 ft/sec without device Functional gait assessment:  score 18/30          09/17/23 0001  Functional Gait  Assessment  Gait Level Surface 2 (6.54, 7.00 secs)  Change in Gait Speed 2  Gait with Horizontal Head Turns 2  Gait with Vertical Head Turns 1  Gait and Pivot Turn 3  Step Over Obstacle 3  Gait with Narrow Base of Support 0  Gait with Eyes Closed 1  Ambulating Backwards 2  Steps 2  Total Score 18    LLE SLS = 4.00 secs   RLE SLS = unable                                                                                                                    TREATMENT:     Physical Performance For LTG  assessment:  [facility] PT Assessment - 10/22/23 1554       Ambulation/Gait   Gait velocity 32.8 ft over 9.75 sec = 3.36 ft/sec      Functional Gait  Assessment   Gait assessed  Yes    Gait Level Surface Walks 20 ft in less than 7 sec but greater than 5.5 sec, uses assistive device, slower speed, mild gait deviations, or deviates 6-10 in outside of the 12 in walkway width.    Change in Gait Speed Able to smoothly change walking speed without loss of balance or gait deviation. Deviate no more than 6 in outside of the 12 in walkway width.    Gait with Horizontal Head Turns Performs head turns smoothly with slight change in gait velocity (eg, minor disruption to smooth gait path), deviates 6-10 in outside 12 in walkway width, or uses an assistive device.    Gait with Vertical Head Turns Performs task with slight change in gait velocity (eg, minor disruption to smooth gait path), deviates 6 - 10 in outside 12 in walkway width or uses assistive device    Gait and Pivot Turn Pivot turns safely within 3 sec and stops  quickly with no loss of balance.    Step Over Obstacle Is able to step over 2 stacked shoe boxes taped together (9 in total height) without changing gait speed. No evidence of imbalance.    Gait with Narrow Base of Support Ambulates less than 4 steps heel to toe or cannot perform without assistance.    Gait with Eyes Closed Walks 20 ft, slow speed, abnormal gait pattern, evidence for imbalance, deviates 10-15 in outside 12 in walkway width. Requires more than 9 sec to ambulate 20 ft.    Ambulating Backwards Walks 20 ft, uses assistive device, slower speed, mild gait deviations, deviates 6-10 in outside 12 in walkway width.    Steps Alternating feet, must use rail.    Total Score 20    FGA comment: 20/30   medium fall risk           RLE SLS: 10 sec  Gait Gait pattern: decreased hip/knee flexion- Right, decreased hip/knee flexion- Left, decreased ankle dorsiflexion- Right, decreased ankle  dorsiflexion- Left, and poor foot clearance- Right Distance walked: 345 ft Assistive device utilized: None Level of assistance: Modified independence Comments: ongoing gait/balance impairments   Upon initial attempt to perform gait assessment pt reports feeling dizzy after ambulation x 115 ft. Seated BP 125/79, HR 86. Symptoms resolve with seated rest break and no onset of seizures during this visit. Pt able to resume assessment.    PATIENT EDUCATION: Education details: continue HEP, results of OM and functional implications Person educated: Patient and Spouse Education method: Explanation and Demonstration Education comprehension: verbalized understanding and returned demonstration  HOME EXERCISE PROGRAM: Access Code: [email or web address] URL: [email or web address] Date: 09/24/2023 Prepared by: Lorita Rosa  Exercises - Romberg Stance with Eyes Closed  - 1 x daily - 7 x weekly - 1 sets - 5 reps - 30 seconds hold - Wide Tandem Stance with Eyes Closed  - 1 x daily - 7 x weekly - 1 sets - 5 reps - 30 seconds hold - Single Leg Stance with Support  - 1 x daily - 7 x weekly - 1 sets - 5 reps - 30 sec hold   GOALS: Goals reviewed with patient? Yes  SHORT TERM GOALS: SAME AS LTG'S AS ELOS = 4 WEEKS   LONG TERM GOALS: Target date: 10-18-23  Pt will improve FGA score to >/= 24/30 to demonstrate reduced fall risk (fall risk < 22/30) and improved dynamic balance.  Baseline: 18/30, 20/30 (5/20) Goal status: NOT MET  2.  Increase gait velocity to >/= 3.1 ft/sec for increased gait efficiency (no device). Baseline: 10 meter walk test: 11.65 secs = 2.82 ft/sec without device, 3.36 ft/sec no AD (5/20) Goal status: MET  3.  Pt will amb. 250' without device on flat, even surface demonstrating upright posture, and good RLE swing through (with hip and knee flexion) and arm swing during gait for less postural deviations.   Baseline: moderate postural deviations including trunk flexion,  decreased Rt hip and knee flexion in swing and decreased Rt arm swing; 345 ft but with ongoing RLE gait deviations as noted above Goal status: NOT MET  4.  Pt will demonstrate improved balance by ability to stand on RLE for at least 2 secs to increase safety with stepping over objects. Baseline: unable to perform SLS on RLE, 10 sec RLE (5/20) Goal status: MET  5.  Independent in HEP for balance and strengthening.   Baseline:  Goal status: MET   ASSESSMENT:  CLINICAL IMPRESSION: Emphasis of skilled  PT session on assessing LTG in preparation for d/c from OPPT services this date. Pt has met 3/5 LTG due to improving his gait speed to 3.36 ft/sec from 2.82 ft/sec initially, improving his ability to perform SLS on RLE to 10 sec from 0 sec initially, and with being independent with his final HEP. He did not improve his RLE gait impairments/deviations as noted above and remains a signicant fall risk based on his score of 20/30 on the FGA. Plan to d/c from OPPT services this date as he has maximized his current rehab potential and plans to start cardiac rehab soon.       OBJECTIVE IMPAIRMENTS: Abnormal gait, decreased activity tolerance, decreased balance, decreased endurance, decreased strength, dizziness, and postural dysfunction.   ACTIVITY LIMITATIONS: carrying, lifting, bending, squatting, stairs, and locomotion level  PARTICIPATION LIMITATIONS: meal prep, cleaning, laundry, driving, shopping, community activity, and yard work  PERSONAL FACTORS: Past/current experiences and 1-2 comorbidities: h/o CVA, h/o seizures and recent heart catheterization  are also affecting patient's functional outcome.   REHAB POTENTIAL: Good  CLINICAL DECISION MAKING: Evolving/moderate complexity  EVALUATION COMPLEXITY: Moderate  PLAN: discharge from PT   Lorita Rosa, PT Lorita Rosa, PT, DPT, CSRS    10/22/2023, 4:27 PM

## 2023-10-28 NOTE — Addendum Note (Signed)
 Addended by: Albino Hum on: 10/28/2023 05:21 PM   Modules accepted: Orders

## 2023-11-13 ENCOUNTER — Encounter (INDEPENDENT_AMBULATORY_CARE_PROVIDER_SITE_OTHER): Admitting: Ophthalmology

## 2023-11-13 DIAGNOSIS — E113391 Type 2 diabetes mellitus with moderate nonproliferative diabetic retinopathy without macular edema, right eye: Secondary | ICD-10-CM

## 2023-11-13 DIAGNOSIS — I10 Essential (primary) hypertension: Secondary | ICD-10-CM

## 2023-11-13 DIAGNOSIS — H35033 Hypertensive retinopathy, bilateral: Secondary | ICD-10-CM | POA: Diagnosis not present

## 2023-11-13 DIAGNOSIS — E113312 Type 2 diabetes mellitus with moderate nonproliferative diabetic retinopathy with macular edema, left eye: Secondary | ICD-10-CM

## 2023-11-13 DIAGNOSIS — H43813 Vitreous degeneration, bilateral: Secondary | ICD-10-CM | POA: Diagnosis not present

## 2023-11-13 DIAGNOSIS — H43822 Vitreomacular adhesion, left eye: Secondary | ICD-10-CM | POA: Diagnosis not present

## 2023-11-13 DIAGNOSIS — Z794 Long term (current) use of insulin: Secondary | ICD-10-CM | POA: Diagnosis not present

## 2023-11-13 DIAGNOSIS — Z7985 Long-term (current) use of injectable non-insulin antidiabetic drugs: Secondary | ICD-10-CM | POA: Diagnosis not present

## 2023-11-30 ENCOUNTER — Encounter (HOSPITAL_COMMUNITY): Payer: Self-pay | Admitting: Interventional Radiology

## 2023-12-02 DIAGNOSIS — E1165 Type 2 diabetes mellitus with hyperglycemia: Secondary | ICD-10-CM | POA: Diagnosis not present

## 2024-01-20 ENCOUNTER — Telehealth: Payer: Self-pay | Admitting: Cardiology

## 2024-01-20 NOTE — Telephone Encounter (Signed)
 Spoke with pt's wife per DPR regarding sob. Pt's wife stated Dr. Ladona told them to let him know if the pt started to have more shortness of breath. Pt's wife stated that the pt is becoming short of breath with activities such as walking across the house. Wife stated the pt is not having shortness of breath currently and does not have it at rest. An appointment was made with Jackee Alberts, NP on 9/4. Wife was told that if the pt begins to have shortness of breath at rest and or chest pain that he would need to go to the ED. Wife verbalized understanding. All questions if any were answered.

## 2024-01-20 NOTE — Telephone Encounter (Signed)
 I would have a low threshold to set him up for cardiac cath if symptoms are bad. Thanks for seeing him

## 2024-01-20 NOTE — Telephone Encounter (Signed)
 Patient wife came in to schedule apt due to new symptoms. Patient wife stated patient was told by [name] when new symptoms came up to reach out. Patient wife was upset due to being on hold for so long decided to come in. Wanted to speak to nurse to speak about symptoms.

## 2024-01-29 ENCOUNTER — Telehealth: Payer: Self-pay | Admitting: Cardiology

## 2024-01-29 ENCOUNTER — Ambulatory Visit: Attending: Cardiology | Admitting: Cardiology

## 2024-01-29 ENCOUNTER — Encounter: Payer: Self-pay | Admitting: Cardiology

## 2024-01-29 VITALS — BP 92/66 | HR 85 | Resp 16 | Ht 68.0 in | Wt 195.8 lb

## 2024-01-29 DIAGNOSIS — I10 Essential (primary) hypertension: Secondary | ICD-10-CM | POA: Diagnosis not present

## 2024-01-29 DIAGNOSIS — R0609 Other forms of dyspnea: Secondary | ICD-10-CM | POA: Diagnosis not present

## 2024-01-29 DIAGNOSIS — I25118 Atherosclerotic heart disease of native coronary artery with other forms of angina pectoris: Secondary | ICD-10-CM | POA: Diagnosis not present

## 2024-01-29 LAB — LAB REPORT - SCANNED
A1c: 7.7
Albumin/Creatinine Ratio, Urine, POC: 28
Creatinine, POC: 139 mg/dL
EGFR: 37
Microalbumin, Urine: 3.9

## 2024-01-29 NOTE — Patient Instructions (Signed)
 Medication Instructions:  Your physician recommends that you continue on your current medications as directed. Please refer to the Current Medication list given to you today.  *If you need a refill on your cardiac medications before your next appointment, please call your pharmacy*  Lab Work: None at this time  If you have labs (blood work) drawn today and your tests are completely normal, you will receive your results only by: MyChart Message (if you have MyChart) OR A paper copy in the mail If you have any lab test that is abnormal or we need to change your treatment, we will call you to review the results.  Testing/Procedures: Your physician has requested that you have a cardiac catheterization. Cardiac catheterization is used to diagnose and/or treat various heart conditions. Doctors may recommend this procedure for a number of different reasons. The most common reason is to evaluate chest pain. Chest pain can be a symptom of coronary artery disease (CAD), and cardiac catheterization can show whether plaque is narrowing or blocking your heart's arteries. This procedure is also used to evaluate the valves, as well as measure the blood flow and oxygen levels in different parts of your heart. For further information please visit [email or web address] Please follow instruction sheet, as given. Scheduled for September 3  Follow-Up: At [facility], you and your health needs are our priority.  As part of our continuing mission to provide you with exceptional heart care, our providers are all part of one team.  This team includes your primary Cardiologist (physician) and Advanced Practice Providers or APPs (Physician Assistants and Nurse Practitioners) who all work together to provide you with the care you need, when you need it.  Your next appointment:   October 14  Provider:   Gordy Bergamo, MD    We recommend signing up for the patient portal called MyChart.  Sign up information is  provided on this After Visit Summary.  MyChart is used to connect with patients for Virtual Visits (Telemedicine).  Patients are able to view lab/test results, encounter notes, upcoming appointments, etc.  Non-urgent messages can be sent to your provider as well.   To learn more about what you can do with MyChart, go to [email or web address].   Other Instructions  [facility] A DEPT OF [facility] AT [address] A DEPT OF THE [place] [address] [place] [address] Dept: [phone] Loc: [phone]  Jeff [name]  01/29/2024  You are scheduled for a Cardiac Catheterization on Wednesday, September 3 with Dr. Gordy [name].  1. Please arrive at the [facility] (Main Entrance A) at [facility]: [address] [place], [place] [address] at 8:00 AM (This time is 2 hour(s) before your procedure to ensure your preparation).   Free valet parking service is available. You will check in at ADMITTING. The support person will be asked to wait in the waiting room.  It is OK to have someone drop you off and come back when you are ready to be discharged.    Special note: Every effort is made to have your procedure done on time. Please understand that emergencies sometimes delay scheduled procedures.  2. Diet: Nothing to eat after midnight.   3. Hydration: You need to be well hydrated before your procedure. On September 3, you may drink approved liquids (see below) until 2 hours before the procedure, with 16 oz of water as your last intake.   List of  approved liquids water, clear juice, clear tea, black coffee, fruit juices, non-citric and without pulp, carbonated beverages, Gatorade, Kool -Aid, plain Jello-O and plain ice popsicles.  4. Labs: SABRA  We are going to get your most recent lab results.  Will let you know if updated lab work is needed  5. Medication instructions in preparation for your procedure: Do not take jardiance the morning  of the procedure Do not take any insulin  the morning of the procedure Do not take furosemide  the day before the procedure and morning of the procedure   Contrast Allergy: No    On the morning of your procedure, take your Aspirin  81 mg and Plavix /Clopidogrel  and any morning medicines NOT listed above.  You may use sips of water.  6. Plan to go home the same day, you will only stay overnight if medically necessary. 7. Bring a current list of your medications and current insurance cards. 8. You MUST have a responsible person to drive you home. 9. Someone MUST be with you the first 24 hours after you arrive home or your discharge will be delayed. 10. Please wear clothes that are easy to get on and off and wear slip-on shoes.  Thank you for allowing us  to care for you!   -- [facility]

## 2024-01-29 NOTE — H&P (View-Only) (Signed)
 Cardiology Office Note:  .   Date:  01/29/2024  ID:  Jeff [name], DOB July 06, 1950, MRN [id number] PCP: [facility] Providers Cardiologist:  Gordy Bergamo, MD   History of Present Illness: .   Jeff [name] is a 73 y.o. 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  He had been doing well until 1 month ago when he started noticing gradually worsening and progressive dyspnea on exertion and hence an urgent appointment was made for follow-up.  His wife is present.  There is no history to suggest PND or orthopnea.  He has not had any chest pain.  He has had occasional dizziness when he suddenly stands up but no syncope.  Cardiac Studies relevent.    CARDIAC CATHETERIZATION 09/11/2023  Normal right heart cath  3.5 x 28 mm Synergy XD DES proximal and mid LAD. 3.0 x 16 mm Synergy XD DES mid OM1 Residual 85% stenosis and small to moderate-sized OM 2 and 90% in apical LAD.        Discussed the use of AI scribe software for clinical note transcription with the patient, who gave verbal consent to proceed.  History of Present Illness Jeff [name] is a 73 year old male with a history of stroke who presents with worsening shortness of breath. He was referred by Dr. Johnie for evaluation of his symptoms.  Shortness of breath has been present for three to four weeks, worsening over the last two weeks. It occurs with minimal exertion, such as putting on socks or shoes and walking short distances, and gasping for  breath after showering.  He experiences orthopnea, using two pillows to sleep, and sometimes wakes up feeling short of breath. He takes long naps in the afternoon due to difficulty sleeping at night.  He feels lightheaded and dizzy, with a lack of energy. Blood pressure was recorded at 90/60 mmHg, lower than usual readings of over 110/70 mmHg. He recalls an instance of elevated blood pressure at 180/110 mmHg during difficulty breathing, without wheezing.  He participates in a cholesterol study and takes Ozempic  on Mondays, which has helped him lose weight, although there is a recent increase in weight over the past couple of weeks.   Labs   Lab Results  Component Value Date   CHOL 134 09/30/2023   HDL 43 09/30/2023   LDLCALC 62 09/30/2023   TRIG 170 (H) 09/30/2023   CHOLHDL 3.1 09/30/2023   No results found for: LIPOA  Recent Labs    07/19/23 1426 08/27/23 1550 09/11/23 0945 09/11/23 0949 09/11/23 0955 09/30/23 1229  NA 137 140   < > 142  140 140 142  K 3.6 3.7   < > 3.6  3.9 3.9 3.9  CL 104 104  --   --   --  106  CO2 21* 22  --   --   --  CANCELED  GLUCOSE 175* 136*  --   --   --  61*  BUN 21 19  --   --   --  18  CREATININE 1.69* 1.56*  --   --   --  1.44*  CALCIUM  9.5 9.1  --   --   --  9.6  GFRNONAA 42*  --   --   --   --   --    < > = values in this interval not displayed.    Lab Results  Component Value Date   ALT 33 07/19/2023   AST 25 07/19/2023   ALKPHOS 57 07/19/2023   BILITOT 0.6 07/19/2023      Latest Ref Rng & Units 09/11/2023    9:55 AM 09/11/2023    9:49 AM 09/11/2023    9:45 AM  CBC  Hemoglobin 13.0 - 17.0 g/dL 85.3  85.3    86.0  84.9    14.6   Hematocrit 39.0 - 52.0 % 43.0  43.0    41.0  44.0    43.0    Lab Results  Component Value Date   HGBA1C 6.7 (H) 11/20/2022    Lab Results  Component Value Date   TSH 1.915 02/27/2014    ROS  Review of Systems  Constitutional: Positive for malaise/fatigue.  Cardiovascular:  Positive for  dyspnea on exertion. Negative for chest pain, claudication, leg swelling, paroxysmal nocturnal dyspnea and syncope.   Physical Exam:   VS:  BP 92/66 (BP Location: Left Arm, Patient Position: Sitting, Cuff Size: Large)   Pulse 85   Resp 16   Ht 5' 8 (1.727 m)   Wt 195 lb 12.8 oz (88.8 kg)   SpO2 96%   BMI 29.77 kg/m    Wt Readings from Last 3 Encounters:  01/29/24 195 lb 12.8 oz (88.8 kg)  09/30/23 193 lb 3.2 oz (87.6 kg)  09/11/23 181 lb (82.1 kg)    BP Readings from Last 3 Encounters:  01/29/24 92/66  09/30/23 134/86  09/25/23 115/76   Physical Exam Neck:     Vascular: No carotid bruit or JVD.  Cardiovascular:     Rate and Rhythm: Normal rate and regular rhythm.     Pulses:          Popliteal pulses are 2+ on the right side and 0 on the left side.       Dorsalis pedis pulses are 2+ on the right side and 0 on the left side.       Posterior tibial pulses are 2+ on the right side and 0 on the left side.     Heart sounds: Normal heart sounds. No murmur heard.    No gallop.  Pulmonary:     Effort: Pulmonary effort is normal.     Breath sounds: Normal breath sounds.  Abdominal:     General: Bowel sounds are normal.     Palpations: Abdomen is soft.  Musculoskeletal:     Right lower leg: No edema.     Left lower leg: No edema.    EKG:    EKG Interpretation Date/Time:  Wednesday January 29 2024 15:44:31 EDT Ventricular Rate:  85 PR Interval:  162 QRS Duration:  92 QT Interval:  368 QTC Calculation: 437 R Axis:   -12  Text Interpretation: Normal sinus rhythm Normal ECG When compared with ECG of 30-Sep-2023 11:54, No significant change was found Confirmed by Anely Spiewak, Jagadeesh [phone]) on 01/29/2024 4:00:05 PM    ASSESSMENT AND PLAN: .      ICD-10-CM   1. Coronary artery disease involving native coronary artery of native heart with other form of angina pectoris (HCC)  I25.118 EKG  12-Lead    2. Dyspnea on exertion  R06.09     3. Primary hypertension  I10       Assessment & Plan Exertional dyspnea and shortness of breath/Anginal equavalent etiology under evaluation Exertional dyspnea and shortness of breath for the past three weeks, worsening over the last two weeks. Symptoms occur with minimal exertion such as putting on socks or walking. Nocturnal dyspnea is present but not frequent. Previous stress test did not show blockage. Previous right heart catheterization was normal. - Proceed with left heart catheterization to assess for coronary artery disease progression - Discussed risks of cardiac catheterization including less than 1% risk of death, stroke, myocardial infarction, urgent bypass, and renal failure but not limited to these.  Episodic hypotension Reported blood pressure of 90/60 mmHg at a recent visit, which is lower than usual home readings of 110s/70s.  - Except for occasional dizziness when he bends down no symptoms to suggest blood pressure is an issue hence continue low-dose amlodipine  2.5 daily for anginal equivalent dyspnea  Insomnia and disrupted sleep Difficulty sleeping at night, waking up in the middle of the night. Afternoon naps may be contributing to disrupted circadian rhythm. - Advise against taking long naps in the afternoon to improve nighttime sleep quality. - Consider sleep study.  Diabetes mellitus with stage IIIa-b chronic kidney disease Contrast used need to be kept in mind. Patient is enrolled in clinical research and is on additional lipid-lowering therapy, he had labs done this morning during his recent visit which will be faxed over to me for preprocedure evaluation. Patient's wife is present and all questions answered.  Schedule for cardiac catheterization, and possible angioplasty. We discussed regarding risks, benefits, alternatives to this including stress testing, CTA and continued medical therapy. Patient wants to proceed. Understands <1-2% risk of death, stroke, MI, urgent CABG, bleeding, infection,  2-3% renal failure but not limited to these.    Follow up: 4 to 6 weeks.  Signed,  Gordy Bergamo, MD, [facility] 01/29/2024, 6:31 PM [facility] [address] [place], [place] [address] Phone: [phone]. Fax:  [phone]

## 2024-01-29 NOTE — Progress Notes (Signed)
 Cardiology Office Note:  .   Date:  01/29/2024  ID:  [name], DOB July 06, 1950, MRN [id number] PCP: [facility] Providers Cardiologist:  Gordy Bergamo, MD   History of Present Illness: .   Jeff [name] is a 73 y.o. 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  He had been doing well until 1 month ago when he started noticing gradually worsening and progressive dyspnea on exertion and hence an urgent appointment was made for follow-up.  His wife is present.  There is no history to suggest PND or orthopnea.  He has not had any chest pain.  He has had occasional dizziness when he suddenly stands up but no syncope.  Cardiac Studies relevent.    CARDIAC CATHETERIZATION 09/11/2023  Normal right heart cath  3.5 x 28 mm Synergy XD DES proximal and mid LAD. 3.0 x 16 mm Synergy XD DES mid OM1 Residual 85% stenosis and small to moderate-sized OM 2 and 90% in apical LAD.        Discussed the use of AI scribe software for clinical note transcription with the patient, who gave verbal consent to proceed.  History of Present Illness Jeff [name] is a 73 year old male with a history of stroke who presents with worsening shortness of breath. He was referred by Dr. Johnie for evaluation of his symptoms.  Shortness of breath has been present for three to four weeks, worsening over the last two weeks. It occurs with minimal exertion, such as putting on socks or shoes and walking short distances, and gasping for  breath after showering.  He experiences orthopnea, using two pillows to sleep, and sometimes wakes up feeling short of breath. He takes long naps in the afternoon due to difficulty sleeping at night.  He feels lightheaded and dizzy, with a lack of energy. Blood pressure was recorded at 90/60 mmHg, lower than usual readings of over 110/70 mmHg. He recalls an instance of elevated blood pressure at 180/110 mmHg during difficulty breathing, without wheezing.  He participates in a cholesterol study and takes Ozempic  on Mondays, which has helped him lose weight, although there is a recent increase in weight over the past couple of weeks.   Labs   Lab Results  Component Value Date   CHOL 134 09/30/2023   HDL 43 09/30/2023   LDLCALC 62 09/30/2023   TRIG 170 (H) 09/30/2023   CHOLHDL 3.1 09/30/2023   No results found for: LIPOA  Recent Labs    07/19/23 1426 08/27/23 1550 09/11/23 0945 09/11/23 0949 09/11/23 0955 09/30/23 1229  NA 137 140   < > 142  140 140 142  K 3.6 3.7   < > 3.6  3.9 3.9 3.9  CL 104 104  --   --   --  106  CO2 21* 22  --   --   --  CANCELED  GLUCOSE 175* 136*  --   --   --  61*  BUN 21 19  --   --   --  18  CREATININE 1.69* 1.56*  --   --   --  1.44*  CALCIUM  9.5 9.1  --   --   --  9.6  GFRNONAA 42*  --   --   --   --   --    < > = values in this interval not displayed.    Lab Results  Component Value Date   ALT 33 07/19/2023   AST 25 07/19/2023   ALKPHOS 57 07/19/2023   BILITOT 0.6 07/19/2023      Latest Ref Rng & Units 09/11/2023    9:55 AM 09/11/2023    9:49 AM 09/11/2023    9:45 AM  CBC  Hemoglobin 13.0 - 17.0 g/dL 85.3  85.3    86.0  84.9    14.6   Hematocrit 39.0 - 52.0 % 43.0  43.0    41.0  44.0    43.0    Lab Results  Component Value Date   HGBA1C 6.7 (H) 11/20/2022    Lab Results  Component Value Date   TSH 1.915 02/27/2014    ROS  Review of Systems  Constitutional: Positive for malaise/fatigue.  Cardiovascular:  Positive for  dyspnea on exertion. Negative for chest pain, claudication, leg swelling, paroxysmal nocturnal dyspnea and syncope.   Physical Exam:   VS:  BP 92/66 (BP Location: Left Arm, Patient Position: Sitting, Cuff Size: Large)   Pulse 85   Resp 16   Ht 5' 8 (1.727 m)   Wt 195 lb 12.8 oz (88.8 kg)   SpO2 96%   BMI 29.77 kg/m    Wt Readings from Last 3 Encounters:  01/29/24 195 lb 12.8 oz (88.8 kg)  09/30/23 193 lb 3.2 oz (87.6 kg)  09/11/23 181 lb (82.1 kg)    BP Readings from Last 3 Encounters:  01/29/24 92/66  09/30/23 134/86  09/25/23 115/76   Physical Exam Neck:     Vascular: No carotid bruit or JVD.  Cardiovascular:     Rate and Rhythm: Normal rate and regular rhythm.     Pulses:          Popliteal pulses are 2+ on the right side and 0 on the left side.       Dorsalis pedis pulses are 2+ on the right side and 0 on the left side.       Posterior tibial pulses are 2+ on the right side and 0 on the left side.     Heart sounds: Normal heart sounds. No murmur heard.    No gallop.  Pulmonary:     Effort: Pulmonary effort is normal.     Breath sounds: Normal breath sounds.  Abdominal:     General: Bowel sounds are normal.     Palpations: Abdomen is soft.  Musculoskeletal:     Right lower leg: No edema.     Left lower leg: No edema.    EKG:    EKG Interpretation Date/Time:  Wednesday January 29 2024 15:44:31 EDT Ventricular Rate:  85 PR Interval:  162 QRS Duration:  92 QT Interval:  368 QTC Calculation: 437 R Axis:   -12  Text Interpretation: Normal sinus rhythm Normal ECG When compared with ECG of 30-Sep-2023 11:54, No significant change was found Confirmed by Anely Spiewak, Jagadeesh [phone]) on 01/29/2024 4:00:05 PM    ASSESSMENT AND PLAN: .      ICD-10-CM   1. Coronary artery disease involving native coronary artery of native heart with other form of angina pectoris (HCC)  I25.118 EKG  12-Lead    2. Dyspnea on exertion  R06.09     3. Primary hypertension  I10       Assessment & Plan Exertional dyspnea and shortness of breath/Anginal equavalent etiology under evaluation Exertional dyspnea and shortness of breath for the past three weeks, worsening over the last two weeks. Symptoms occur with minimal exertion such as putting on socks or walking. Nocturnal dyspnea is present but not frequent. Previous stress test did not show blockage. Previous right heart catheterization was normal. - Proceed with left heart catheterization to assess for coronary artery disease progression - Discussed risks of cardiac catheterization including less than 1% risk of death, stroke, myocardial infarction, urgent bypass, and renal failure but not limited to these.  Episodic hypotension Reported blood pressure of 90/60 mmHg at a recent visit, which is lower than usual home readings of 110s/70s.  - Except for occasional dizziness when he bends down no symptoms to suggest blood pressure is an issue hence continue low-dose amlodipine  2.5 daily for anginal equivalent dyspnea  Insomnia and disrupted sleep Difficulty sleeping at night, waking up in the middle of the night. Afternoon naps may be contributing to disrupted circadian rhythm. - Advise against taking long naps in the afternoon to improve nighttime sleep quality. - Consider sleep study.  Diabetes mellitus with stage IIIa-b chronic kidney disease Contrast used need to be kept in mind. Patient is enrolled in clinical research and is on additional lipid-lowering therapy, he had labs done this morning during his recent visit which will be faxed over to me for preprocedure evaluation. Patient's wife is present and all questions answered.  Schedule for cardiac catheterization, and possible angioplasty. We discussed regarding risks, benefits, alternatives to this including stress testing, CTA and continued medical therapy. Patient wants to proceed. Understands <1-2% risk of death, stroke, MI, urgent CABG, bleeding, infection,  2-3% renal failure but not limited to these.    Follow up: 4 to 6 weeks.  Signed,  Gordy Bergamo, MD, [facility] 01/29/2024, 6:31 PM [facility] [address] [place], [place] [address] Phone: [phone]. Fax:  [phone]

## 2024-01-29 NOTE — Telephone Encounter (Signed)
 Spoke with Francina regarding provider, Dr. Johnie on the line to discuss worsening shortness of breath. Pt of Dr. Ladona. Dr. Johnie would like for pt to be seen today if possible. Secure chat with Dr. Godfrey nurse, with ok to schedule on DOD schedule today at 3:20pm. Kierra will schedule pt and update Dr. Johnie.

## 2024-01-29 NOTE — Telephone Encounter (Signed)
 See previous encounter. Dr. Johnie following up with concerns of SOB. Worsening with minimal exertion--issues just putting on socks.  Requested to move up appointment to today.   Spoke with DOD 1 and 2 covering. Patient was added on today with Dr. Ladona at 3:20 PM. Dr. Johnie made patient aware. No further questions.

## 2024-02-02 ENCOUNTER — Ambulatory Visit: Payer: Self-pay | Admitting: Cardiology

## 2024-02-02 NOTE — Progress Notes (Signed)
 Labs 01/29/2024:  BUN 24, creatinine 1.89, potassium 4.1.  Serum glucose 164 mg.  ALT mildly elevated at 51, AST normal.  Hb 16.4/HCT 49.0, platelets 209.  Total cholesterol 156, triglycerides 197, HDL 47, LDL 70.  Non-HDL cholesterol 109.  Urinary albumin to creatinine ratio 28.  Labs are stable to proceed with cardiac catheterization and possible coronary angioplasty.

## 2024-02-04 ENCOUNTER — Telehealth: Payer: Self-pay | Admitting: *Deleted

## 2024-02-04 ENCOUNTER — Telehealth: Payer: Self-pay | Admitting: Cardiology

## 2024-02-04 NOTE — Telephone Encounter (Signed)
 Last office visit faxed through Epic to requested number

## 2024-02-04 NOTE — Telephone Encounter (Signed)
 Cardiac Catheterization scheduled at [facility] for: Wednesday February 05, 2024 1 PM Arrival time [facility] Main Entrance A at: 8 AM-pre-procedure hydration-(01/29/24 eGFR 37)  Diet: -May have light meal until 7 AM. (6 hours before procedure time) Approved light meal consists of plain toast, fruit, light soups, crackers.  Hydration: -May drink clear liquids until 2 hours before the procedure.  Approved liquids: Water , clear tea, black coffee, fruit juices-non-citric and without pulp,Gatorade, plain Jello/popsicles.  No PO hydration (bottle of water ) OTW to hospital-going in early for hydration  Medication instructions -Hold:  Insulin  pump-pt will manage  Jardiance-AM of procedure -Other usual morning medications can be taken including aspirin  81 mg and Plavix  75 mg.  Ozempic -weekly on Mondays-last dose 02/03/24  Plan to go home the same day, you will only stay overnight if medically necessary.  You must have responsible adult to drive you home.  Someone must be with you the first 24 hours after you arrive home.  Reviewed procedure instructions, pre-procedure hydration with patient's wife (Jeff [name]), Jeff [name].

## 2024-02-04 NOTE — Telephone Encounter (Signed)
 Wife called and stated that the [facility] needs [facility] last office visit notes Faxed to [phone] [facility]sp[facility] Bennett   Best number [phone] Wife number

## 2024-02-05 ENCOUNTER — Other Ambulatory Visit (HOSPITAL_COMMUNITY): Payer: Self-pay

## 2024-02-05 ENCOUNTER — Ambulatory Visit (HOSPITAL_COMMUNITY)
Admission: RE | Admit: 2024-02-05 | Discharge: 2024-02-05 | Disposition: A | Discharge status: Home / Self Care | Attending: Cardiology | Admitting: Cardiology

## 2024-02-05 ENCOUNTER — Other Ambulatory Visit: Payer: Self-pay

## 2024-02-05 ENCOUNTER — Ambulatory Visit (HOSPITAL_COMMUNITY)
Admission: RE | Disposition: A | Discharge status: Home / Self Care | Payer: Self-pay | Source: Home / Self Care | Attending: Cardiology

## 2024-02-05 DIAGNOSIS — Z7902 Long term (current) use of antithrombotics/antiplatelets: Secondary | ICD-10-CM | POA: Insufficient documentation

## 2024-02-05 DIAGNOSIS — Z955 Presence of coronary angioplasty implant and graft: Secondary | ICD-10-CM | POA: Insufficient documentation

## 2024-02-05 DIAGNOSIS — I25118 Atherosclerotic heart disease of native coronary artery with other forms of angina pectoris: Secondary | ICD-10-CM | POA: Insufficient documentation

## 2024-02-05 DIAGNOSIS — I2510 Atherosclerotic heart disease of native coronary artery without angina pectoris: Secondary | ICD-10-CM

## 2024-02-05 DIAGNOSIS — I2584 Coronary atherosclerosis due to calcified coronary lesion: Secondary | ICD-10-CM | POA: Insufficient documentation

## 2024-02-05 DIAGNOSIS — G4700 Insomnia, unspecified: Secondary | ICD-10-CM | POA: Insufficient documentation

## 2024-02-05 DIAGNOSIS — R0602 Shortness of breath: Secondary | ICD-10-CM | POA: Diagnosis present

## 2024-02-05 DIAGNOSIS — I129 Hypertensive chronic kidney disease with stage 1 through stage 4 chronic kidney disease, or unspecified chronic kidney disease: Secondary | ICD-10-CM | POA: Insufficient documentation

## 2024-02-05 DIAGNOSIS — I10 Essential (primary) hypertension: Secondary | ICD-10-CM | POA: Diagnosis present

## 2024-02-05 DIAGNOSIS — E785 Hyperlipidemia, unspecified: Secondary | ICD-10-CM | POA: Diagnosis present

## 2024-02-05 DIAGNOSIS — Z79899 Other long term (current) drug therapy: Secondary | ICD-10-CM | POA: Insufficient documentation

## 2024-02-05 DIAGNOSIS — E7800 Pure hypercholesterolemia, unspecified: Secondary | ICD-10-CM | POA: Insufficient documentation

## 2024-02-05 DIAGNOSIS — Z7982 Long term (current) use of aspirin: Secondary | ICD-10-CM | POA: Insufficient documentation

## 2024-02-05 DIAGNOSIS — Z8673 Personal history of transient ischemic attack (TIA), and cerebral infarction without residual deficits: Secondary | ICD-10-CM | POA: Insufficient documentation

## 2024-02-05 DIAGNOSIS — E1122 Type 2 diabetes mellitus with diabetic chronic kidney disease: Secondary | ICD-10-CM | POA: Diagnosis not present

## 2024-02-05 DIAGNOSIS — Z9189 Other specified personal risk factors, not elsewhere classified: Secondary | ICD-10-CM

## 2024-02-05 DIAGNOSIS — E119 Type 2 diabetes mellitus without complications: Secondary | ICD-10-CM

## 2024-02-05 DIAGNOSIS — N1831 Chronic kidney disease, stage 3a: Secondary | ICD-10-CM | POA: Insufficient documentation

## 2024-02-05 DIAGNOSIS — N189 Chronic kidney disease, unspecified: Secondary | ICD-10-CM | POA: Diagnosis present

## 2024-02-05 HISTORY — PX: CORONARY STENT INTERVENTION: CATH118234

## 2024-02-05 HISTORY — PX: CORONARY ANGIOGRAPHY: CATH118303

## 2024-02-05 LAB — POCT ACTIVATED CLOTTING TIME: Activated Clotting Time: 302 s

## 2024-02-05 LAB — GLUCOSE, CAPILLARY
Glucose-Capillary: 145 mg/dL — ABNORMAL HIGH (ref 70–99)
Glucose-Capillary: 98 mg/dL (ref 70–99)

## 2024-02-05 SURGERY — CORONARY STENT INTERVENTION
Anesthesia: LOCAL

## 2024-02-05 MED ORDER — HEPARIN SODIUM (PORCINE) 1000 UNIT/ML IJ SOLN
INTRAMUSCULAR | Status: AC
  Filled 2024-02-05: qty 10

## 2024-02-05 MED ORDER — VERAPAMIL HCL 2.5 MG/ML IV SOLN
INTRAVENOUS | Status: AC
  Filled 2024-02-05: qty 2

## 2024-02-05 MED ORDER — SODIUM CHLORIDE 0.9% FLUSH: 3.0000 mL | INTRAVENOUS | Status: DC | PRN

## 2024-02-05 MED ORDER — TETRACAINE HCL 1 % IJ SOLN
INTRAMUSCULAR | Status: DC | PRN
Start: 2024-02-05 — End: 2024-02-05
  Administered 2024-02-05: 2 mg

## 2024-02-05 MED ORDER — SODIUM CHLORIDE 0.9 % IV SOLN: 250.0000 mL | INTRAVENOUS | Status: DC | PRN

## 2024-02-05 MED ORDER — ASPIRIN 81 MG PO TBEC: 81.0000 mg | DELAYED_RELEASE_TABLET | Freq: Every day | ORAL | Status: AC

## 2024-02-05 MED ORDER — ROSUVASTATIN CALCIUM 40 MG PO TABS
40.0000 mg | ORAL_TABLET | Freq: Every day | ORAL | 3 refills | Status: AC
  Filled 2024-02-05: qty 90, 90d supply, fill #0

## 2024-02-05 MED ORDER — ONDANSETRON HCL 4 MG/2ML IJ SOLN: 4.0000 mg | Freq: Four times a day (QID) | INTRAMUSCULAR | Status: DC | PRN

## 2024-02-05 MED ORDER — FENTANYL CITRATE (PF) 100 MCG/2ML IJ SOLN
INTRAMUSCULAR | Status: DC | PRN
  Administered 2024-02-05: 25 ug via INTRAVENOUS

## 2024-02-05 MED ORDER — CLOPIDOGREL BISULFATE 75 MG PO TABS
75.0000 mg | ORAL_TABLET | Freq: Every day | ORAL | 1 refills | Status: AC
Start: 2024-02-05 — End: 2025-02-04
  Filled 2024-02-05: qty 90, 90d supply, fill #0

## 2024-02-05 MED ORDER — CLOPIDOGREL BISULFATE 75 MG PO TABS: 75.0000 mg | ORAL_TABLET | ORAL | Status: DC

## 2024-02-05 MED ORDER — SODIUM CHLORIDE 0.9% FLUSH: 3.0000 mL | Freq: Two times a day (BID) | INTRAVENOUS | Status: DC

## 2024-02-05 MED ORDER — ACETAMINOPHEN 325 MG PO TABS
650.0000 mg | ORAL_TABLET | ORAL | Status: DC | PRN
Start: 2024-02-05 — End: 2024-02-05

## 2024-02-05 MED ORDER — FENTANYL CITRATE (PF) 100 MCG/2ML IJ SOLN
INTRAMUSCULAR | Status: AC
  Filled 2024-02-05: qty 2

## 2024-02-05 MED ORDER — MIDAZOLAM HCL 2 MG/2ML IJ SOLN
INTRAMUSCULAR | Status: DC | PRN
  Administered 2024-02-05: 2 mg via INTRAVENOUS

## 2024-02-05 MED ORDER — IOHEXOL 350 MG/ML SOLN
INTRAVENOUS | Status: DC | PRN
  Administered 2024-02-05: 100 mL

## 2024-02-05 MED ORDER — SODIUM CHLORIDE 0.9 % WEIGHT BASED INFUSION
1.0000 mL/kg/h | INTRAVENOUS | Status: DC
Start: 2024-02-05 — End: 2024-02-05

## 2024-02-05 MED ORDER — ASPIRIN 81 MG PO CHEW
81.0000 mg | CHEWABLE_TABLET | ORAL | Status: DC
Start: 2024-02-05 — End: 2024-02-05

## 2024-02-05 MED ORDER — TETRACAINE HCL 1 % IJ SOLN
5.0000 mg | Freq: Once | INTRAMUSCULAR | Status: DC
  Filled 2024-02-05: qty 2

## 2024-02-05 MED ORDER — FREE WATER: 500.0000 mL | Freq: Once | Status: DC

## 2024-02-05 MED ORDER — SODIUM CHLORIDE 0.9 % WEIGHT BASED INFUSION
3.0000 mL/kg/h | INTRAVENOUS | Status: AC
  Administered 2024-02-05: 3 mL/kg/h via INTRAVENOUS

## 2024-02-05 MED ORDER — NITROGLYCERIN 1 MG/10 ML FOR IR/CATH LAB
INTRA_ARTERIAL | Status: AC
  Filled 2024-02-05: qty 10

## 2024-02-05 MED ORDER — MIDAZOLAM HCL 2 MG/2ML IJ SOLN
INTRAMUSCULAR | Status: AC
  Filled 2024-02-05: qty 2

## 2024-02-05 MED ORDER — HEPARIN (PORCINE) IN NACL 1000-0.9 UT/500ML-% IV SOLN
INTRAVENOUS | Status: DC | PRN
  Administered 2024-02-05 (×2): 500 mL

## 2024-02-05 MED ORDER — HEPARIN (PORCINE) IN NACL 2-0.9 UNITS/ML
INTRAMUSCULAR | Status: DC | PRN
  Administered 2024-02-05: 10 mL via INTRA_ARTERIAL

## 2024-02-05 MED ORDER — HEPARIN SODIUM (PORCINE) 1000 UNIT/ML IJ SOLN
INTRAMUSCULAR | Status: DC | PRN
Start: 2024-02-05 — End: 2024-02-05
  Administered 2024-02-05: 5000 [IU] via INTRAVENOUS
  Administered 2024-02-05: 3000 [IU] via INTRAVENOUS

## 2024-02-05 SURGICAL SUPPLY — 14 items
BALLOON SAPPHIRE NC24 3.25X10 (BALLOONS) IMPLANT
BALLOON SCOREFLEX 2.50X10 (BALLOONS) IMPLANT
CATH INFINITI AMBI 5FR TG (CATHETERS) IMPLANT
CATH LAUNCHER 6FR EBU3.5 (CATHETERS) IMPLANT
DEVICE RAD COMP TR BAND LRG (VASCULAR PRODUCTS) IMPLANT
GLIDESHEATH SLEND SS 6F .021 (SHEATH) IMPLANT
GUIDEWIRE INQWIRE 1.5J.035X260 (WIRE) IMPLANT
KIT ENCORE 26 ADVANTAGE (KITS) IMPLANT
KIT HEMO VALVE WATCHDOG (MISCELLANEOUS) IMPLANT
PACK CARDIAC CATHETERIZATION (CUSTOM PROCEDURE TRAY) ×1 IMPLANT
SET ATX-X65L (MISCELLANEOUS) IMPLANT
STENT SYNERGY XD 3.0X16 (Permanent Stent) IMPLANT
WIRE ASAHI PROWATER 180CM (WIRE) IMPLANT
WIRE RUNTHROUGH .014X180CM (WIRE) IMPLANT

## 2024-02-05 NOTE — Discharge Instructions (Addendum)

## 2024-02-05 NOTE — Interval H&P Note (Signed)
 History and Physical Interval Note:  02/05/2024 12:02 PM  Jeff [name]  has presented today for surgery, with the diagnosis of shortness of breath - cad.  The various methods of treatment have been discussed with the patient and family. After consideration of risks, benefits and other options for treatment, the patient has consented to  Procedure(s): LEFT HEART CATH AND CORONARY ANGIOGRAPHY (N/A) and possible coronary intervention as a surgical intervention.  The patient's history has been reviewed, patient examined, no change in status, stable for surgery.  I have reviewed the patient's chart and labs.  Questions were answered to the patient's satisfaction.     Gordy Bergamo

## 2024-02-05 NOTE — Progress Notes (Signed)
 Discussed with pt and wife stent, antiplatelet importance, restrictions, diet, exercise, NTG and CRPII. Pt receptive. He is eager to do CRPII. There was some trouble getting [facility] approval in April but he does not think this will be a problem now. Will refer to [facility].  [phone] Aliene Aris BS, ACSM-CEP 02/05/2024 3:27 PM

## 2024-02-05 NOTE — Progress Notes (Signed)
 Discharge instructions reviewed with patient and wife at bedside. Denies questions or concerns. PT seen by rehab, pharmacy, the MD. Dr. Johann gave verbal orders for four hour bedrest. TR Band removed with no S/S of complications. PT ambulated to the bathroom was able to void without difficulty. PT escorted from the unit via wheel chair to personal vehicle.

## 2024-02-05 NOTE — Discharge Summary (Signed)
 Discharge Summary for Same Day PCI   Patient ID: JIAN HODGMAN MRN: [id number]; DOB: 1951/02/01  Admit date: 02/05/2024 Discharge date: 02/05/2024  Primary Care Provider: [facility]  Primary Cardiologist: Gordy Bergamo, MD  Primary Electrophysiologist:  None   Discharge Diagnoses    Principal Problem:   CAD (coronary artery disease) Active Problems:   Hyperlipidemia   Diabetes mellitus (HCC)   CKD (chronic kidney disease)   Benign essential hypertension   S/P primary angioplasty with coronary stent    Diagnostic Studies/Procedures    Cardiac Catheterization 02/05/2024:  Angiographic data: LM: Large-caliber vessel, mild calcified. LAD: Large-caliber vessel.  The 3.5 x 28 mm Synergy XD DES placed for 09/11/2023 is widely patent with a stepup and stepdown.  There is mild diffuse disease in the mid to distal LAD. RI: Moderate caliber vessel, mild disease. LCx: Dominant vessel.  Gives origin to a very large OM1.  Previously placed 3.0 x 16 mm Synergy XD DES in the mid OM1 is widely patent with a stepup and stepdown.  Proximal OM1 has mild diffuse disease. Large mid to distal Cx, between OM1 and OM 2 the disease appears to have progressed from prior cardiac catheterization constituting 80% stenosis.  OM 2 is small to moderate-sized vessel with diffuse disease in the midsegment by about 80%. RCA: Nondominant with a proximal 90% stenosis.  Small vessel.   Intervention data: Successful PTCA and stenting of the mid CX between OM1 and OM 2 with implantation of a 3.0 x 16 mm Synergy XD DES deployed at 16 atmospheric pressure followed by postdilatation with a 3.25 mm [place] balloon at 20 atmospheric pressure, stenosis reduced from 80% to 0% with TIMI-3 to TIMI-3 flow.  Small to moderate-sized OM 2 with diffuse mid segment disease left alone, 2 mm vessel to at most 2.25 mm vessel.      Impression and recommendations: Patient presently on aspirin  and Plavix , continue the same.  Large dominant  CX, suspect this will certainly improve his symptoms of dyspnea. _____________   History of Present Illness     Jeff MULLANE is a 73 y.o. 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  He had been doing well until 1 month ago when he started noticing gradually worsening and progressive dyspnea on exertion and hence an urgent appointment was made for follow-up.   His wife is present.  There is no history to suggest PND or orthopnea.  He has not had any chest pain.  He has had occasional dizziness when he suddenly stands up but no syncope..   Cardiac catheterization was arranged for further evaluation.  Hospital Course     The patient underwent cardiac cath as noted above for staged intervention with PCI/DES to 80% mid LCX using 3.0 x 16 mm. Plan for DAPT with ASA/plavix . The patient was seen by cardiac rehab while in short stay. There were no observed complications post cath. Radial cath site was re-evaluated prior to discharge and found to be stable without any complications. Instructions/precautions regarding cath site care were given prior to discharge.  Gordy  W Huy was seen by Dr. Ladona and determined stable for discharge home. Follow up with our office has been arranged. Medications are listed below.    _____________  Cath/PCI Registry Performance & Quality Measures: Aspirin  prescribed? - Yes ADP Receptor Inhibitor (Plavix /Clopidogrel , Brilinta /Ticagrelor  or Effient/Prasugrel) prescribed (includes medically managed patients)? -  Yes High Intensity Statin (Lipitor 40-80mg  or Crestor  20-40mg ) prescribed? - Yes For EF <40%, was ACEI/ARB prescribed? - Not Applicable (EF >/= 40%) For EF <40%, Aldosterone Antagonist (Spironolactone or Eplerenone) prescribed? - Not Applicable (EF >/= 40%) Cardiac Rehab Phase II ordered (Included Medically managed Patients)? - Yes  _____________   Discharge Vitals Blood pressure 119/77, pulse 72, temperature 97.7 F (36.5 C), temperature source Oral, resp. rate 20, height 5' 8 (1.727 m), weight 85.7 kg, SpO2 95%.  Filed Weights   02/05/24 0836  Weight: 85.7 kg    Last Labs & Radiologic Studies    CBC No results for input(s): WBC, NEUTROABS, HGB, HCT, MCV, PLT in the last 72 hours. Basic Metabolic Panel No results for input(s): NA, K, CL, CO2, GLUCOSE, BUN, CREATININE, CALCIUM , MG, PHOS in the last 72 hours. Liver Function Tests No results for input(s): AST, ALT, ALKPHOS, BILITOT, PROT, ALBUMIN in the last 72 hours. No results for input(s): LIPASE, AMYLASE in the last 72 hours. High Sensitivity Troponin:   No results for input(s): TROPONINIHS in the last 720 hours.  BNP Invalid input(s): POCBNP D-Dimer No results for input(s): DDIMER in the last 72 hours. Hemoglobin A1C No results for input(s): HGBA1C in the last 72 hours. Fasting Lipid Panel No results for input(s): CHOL, HDL, LDLCALC, TRIG, CHOLHDL, LDLDIRECT in the last 72 hours. Thyroid Function Tests No results for input(s): TSH, T4TOTAL, T3FREE, THYROIDAB in the last 72 hours.  Invalid input(s): FREET3 _____________  CARDIAC CATHETERIZATION Result Date: 02/05/2024 Images from the original result were not included. Cardiac Catheterization 02/05/24: Angiographic data: LM: Large-caliber vessel, mild calcified. LAD: Large-caliber vessel.  The 3.5 x 28 mm Synergy XD DES placed for 09/11/2023 is widely patent with a stepup and stepdown.  There is  mild diffuse disease in the mid to distal LAD. RI: Moderate caliber vessel, mild disease. LCx: Dominant vessel.  Gives origin to a very large OM1.  Previously placed 3.0 x 16 mm Synergy XD DES in the mid OM1 is widely patent with a stepup and stepdown.  Proximal OM1 has mild diffuse disease. Large mid to distal Cx, between OM1 and OM 2 the disease appears to have progressed from prior cardiac catheterization constituting 80% stenosis.  OM 2 is small to moderate-sized vessel with diffuse disease in the midsegment by about 80%. RCA: Nondominant with a proximal 90% stenosis.  Small vessel. Intervention data: Successful PTCA and stenting of the mid CX between OM1 and OM 2 with implantation of a 3.0 x 16 mm Synergy XD DES deployed at 16 atmospheric pressure followed by postdilatation with a 3.25 mm [place] balloon at 20 atmospheric pressure, stenosis reduced from 80% to 0% with TIMI-3 to TIMI-3 flow.  Small to moderate-sized OM 2 with diffuse mid segment disease left alone, 2 mm vessel to at most 2.25 mm vessel. Impression and recommendations: Patient presently on aspirin  and Plavix , continue the same.  Large dominant CX, suspect this will certainly improve his symptoms of dyspnea.    Disposition   Pt is being discharged home today in good condition.  Follow-up Plans & Appointments     Discharge Instructions     AMB Referral to Cardiac Rehabilitation -  Phase II   Complete by: As directed    Diagnosis: Coronary Stents   After initial evaluation and assessments completed: Virtual Based Care may be provided alone or in conjunction with Phase 2 Cardiac Rehab based on patient barriers.: Yes   Intensive Cardiac Rehabilitation (ICR) [facility] location only OR Traditional Cardiac Rehabilitation (TCR) *If criteria for ICR are not met will enroll in TCR [facility] only): Yes        Discharge Medications   Allergies as of 02/05/2024       Reactions   Flexeril [cyclobenzaprine Hcl] Other (See Comments)   Causes him to pass  out and lowers his BP   Lidocaine Swelling   throat   Novocain [procaine Hcl] Swelling        Medication List     TAKE these medications    acetaminophen  500 MG tablet Commonly known as: TYLENOL  Take 500-1,000 mg by mouth every 6 (six) hours as needed for headache or moderate pain (pain score 4-6).   amitriptyline  10 MG tablet Commonly known as: ELAVIL  Take 1 tablet (10 mg total) by mouth at bedtime.   amLODipine  2.5 MG tablet Commonly known as: NORVASC  Take 1 tablet (2.5 mg total) by mouth daily.   aspirin  EC 81 MG tablet Take 1 tablet (81 mg total) by mouth daily. Swallow whole.   clopidogrel  75 MG tablet Commonly known as: Plavix  Take 1 tablet (75 mg total) by mouth daily.   cycloSPORINE 0.05 % ophthalmic emulsion Commonly known as: RESTASIS Place 1 drop into both eyes 2 (two) times daily.   empagliflozin 10 MG Tabs tablet Commonly known as: JARDIANCE Take 10 mg by mouth daily.   furosemide  20 MG tablet Commonly known as: LASIX  Take 0.5 tablets (10 mg total) by mouth daily as needed for edema (Leg swelling).   Glucagon  1 MG/0.2ML Soaj Inject 1 mg into the skin as needed (low blood sugar).   ICAPS AREDS 2 PO Take 1 capsule by mouth in the morning and at bedtime.   levothyroxine  50 MCG tablet Commonly known as: SYNTHROID  Take 50 mcg by mouth daily before breakfast.   Magnesium Oxide 420 MG Tabs Take 420 mg by mouth 2 (two) times daily.   nitroGLYCERIN  0.4 MG SL tablet Commonly known as: NITROSTAT  Place 1 tablet (0.4 mg total) under the tongue every 5 (five) minutes as needed for chest pain.   NovoLOG  FlexPen 100 UNIT/ML FlexPen Generic drug: insulin  aspart Inject 0-20 Units into the skin See admin instructions. Sliding scale per endocrinologist. (3 times with each meal) Less than 100 = 0  100 -150 = 5 units  150-200 = 8 units   200-250 =12 units What changed:  how to take this additional instructions   OneTouch Verio test strip Generic drug:  glucose blood 1 each by Other route 4 (four) times daily -  before meals and at bedtime. dexcom 6   Ozempic  (1 MG/DOSE) 2 MG/1.5ML Sopn Generic drug: Semaglutide  (1 MG/DOSE) Inject 2 mg into the skin every Monday.   pantoprazole  40 MG tablet Commonly known as: PROTONIX  Take 1 tablet (40 mg total) by mouth daily.   PRESCRIPTION MEDICATION Medication  Study for cholesterol, injection on [address] with Dr. Every eight weeks Last visit 02/26- next appointment [facility]   rosuvastatin  40 MG tablet Commonly known as: CRESTOR  TAKE 1 TABLET BY MOUTH DAILY What changed: when to take this   sodium bicarbonate 650 MG tablet Take 650 mg by mouth 2 (two) times daily.  tamsulosin  0.4 MG Caps capsule Commonly known as: FLOMAX  Take 1 capsule (0.4 mg total) by mouth daily.   topiramate  100 MG tablet Commonly known as: TOPAMAX  Take 1 tablet (100 mg total) by mouth 2 (two) times daily.   Vitamin D 50 MCG (2000 UT) tablet Take 2,000 Units by mouth daily.           Allergies Allergies  Allergen Reactions   Flexeril [Cyclobenzaprine Hcl] Other (See Comments)    Causes him to pass out and lowers his BP   Lidocaine Swelling    throat   Novocain [Procaine Hcl] Swelling    Outstanding Labs/Studies     Duration of Discharge Encounter   Greater than 30 minutes including physician time.  Signed, Jon Garre Devan Danzer, PA 02/05/2024, 2:05 PM

## 2024-02-06 ENCOUNTER — Encounter (HOSPITAL_COMMUNITY): Payer: Self-pay | Admitting: Cardiology

## 2024-02-06 ENCOUNTER — Ambulatory Visit: Admitting: Nurse Practitioner

## 2024-02-06 MED FILL — Nitroglycerin IV Soln 100 MCG/ML in D5W: INTRA_ARTERIAL | Qty: 10 | Status: AC

## 2024-02-10 ENCOUNTER — Telehealth (HOSPITAL_COMMUNITY): Payer: Self-pay

## 2024-02-10 NOTE — Telephone Encounter (Signed)
 Confirmed interest in cardiac rehab with patient, he is aware he needs to attend his f/u on 10/14 with Dr. Ladona. Patient states he will be requesting [facility] for his referral.

## 2024-03-11 ENCOUNTER — Encounter: Payer: Self-pay | Admitting: Adult Health

## 2024-03-11 ENCOUNTER — Ambulatory Visit: Admitting: Adult Health

## 2024-03-11 VITALS — BP 118/68 | HR 80 | Ht 68.0 in | Wt 190.0 lb

## 2024-03-11 DIAGNOSIS — G629 Polyneuropathy, unspecified: Secondary | ICD-10-CM | POA: Diagnosis not present

## 2024-03-11 DIAGNOSIS — R269 Unspecified abnormalities of gait and mobility: Secondary | ICD-10-CM | POA: Diagnosis not present

## 2024-03-11 DIAGNOSIS — I6523 Occlusion and stenosis of bilateral carotid arteries: Secondary | ICD-10-CM

## 2024-03-11 DIAGNOSIS — Z8673 Personal history of transient ischemic attack (TIA), and cerebral infarction without residual deficits: Secondary | ICD-10-CM | POA: Diagnosis not present

## 2024-03-11 DIAGNOSIS — R404 Transient alteration of awareness: Secondary | ICD-10-CM

## 2024-03-11 MED ORDER — AMITRIPTYLINE HCL 10 MG PO TABS: 10.0000 mg | ORAL_TABLET | Freq: Every day | ORAL | 3 refills | Status: DC

## 2024-03-11 MED ORDER — TOPIRAMATE 100 MG PO TABS: 100.0000 mg | ORAL_TABLET | Freq: Two times a day (BID) | ORAL | 3 refills | Status: DC

## 2024-03-11 NOTE — Progress Notes (Signed)
 [facility] [address] [place]. [place] [address] [phone]       OFFICE FOLLOW-UP NOTE  Jeff [name] Date of Birth:  [birth year] Medical Record Number:  [id number]   Primary neurologist: Dr. Rosemarie Reason for visit: hx of stroke, seizure-like episodes  Chief Complaint  Patient presents with   RM 3    History of TIA (transient ischemic attack) and stroke      HISTORY:  Jeff [name] is a 73 y.o. male with hx of left cerebellum DWI negative stroke on 08/09/2018 due to left VA occlusion with residual imbalance/gait impairment and right-sided ataxia, TIA 12/2020, transient AMS 08/2019 possibly in setting of hypoglycemia and recurrent episodes of zoning out in spring 2023, and left-sided headaches. Recurrent transient AMS 10/2022 in setting of hypoglycemia with repeat EEG negative and again in 11/2022 (see below)    HPI:   Update 03/11/2024 JM: Patient returns for follow-up visit.  Previously seen 6 months ago and was started on amitriptyline  in addition to topiramate  due to persistent almost daily headaches.  Reports great improvement of headaches since initiating amitriptyline  10 mg nightly, tolerating without side effects.  He does have occasional headaches but only mild.  He also continues on topiramate  100 mg twice daily.  He continues to have occasional right hand numbness which is chronic from his prior stroke.  No new stroke/TIA symptoms.  He does continue to experience bilateral lower extremity neuropathic pain and swelling.  This can interfere with his gait.  Continues to follow with endocrinology.  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  Of note, underwent cardiac cath and stenting in 09/2023 with improvement of dyspnea but had reoccurrence and had repeat cardiac cath with stenting 02/2024.  Remains on both aspirin  and Plavix , has f/u with cardiology next week.  Plans on starting cardiac rehab soon. Wife notes recurrent episodes of blank staring and nonresponsiveness during both cardiac cath procedures, one while doing a stress test and one in June. Did not have any in July or August.      History provided for reference purposes only Update 08/26/2023 JM: Patient returns for 19-month follow-up visit accompanied by his wife.   Reports continued transient AMS episodes, wife reports 2 episodes (back to back) on 1/21 and 1/25 and additional episode on 3/7 after stress test. Reports glucose levels were within normal range but wife unsure exact level.  Episode similar to prior events.  Continues to have frequent headaches, Topamax  dosage increased at prior visit to 100 mg twice daily but he is unsure if this provided any benefit.  He frequently wakes with morning headaches and can have headaches almost daily.  Use of Tylenol  as needed about 2-3 times per week for more severe headaches. Wife is concerned about possible underlying depression, he sleeps majority of the day and feels he hasn't been his normal, outgoing self. He feels this more due to retiring and not having much to do during the day, he does not feel he struggles with depression or anxiety.  He can have sleeping difficulty at times, previously tried a sleeping pill but caused significant daytime fatigue.  He does carry a diagnosis of OSA through [facility] but intolerant to CPAP.  He continues to experience right hand numbness and gait abnormality.  Reports improvement of gait after working with PT, completed in January, he questions restarting PT as he  found this helpful but still has gait difficulty.  He avoids ambulating on uneven surfaces such as walking in grass, use of cane as needed.  Reports great improvement of prior right hand weakness after working with OT. MRI brain 04/2023 no acute abnormalities and essentially unchanged since prior MRI in 11/2022  No new stroke/TIA symptoms since prior visit.   Remains on aspirin  and Crestor .  Routinely follows with PCP for stroke risk factor management.  Is currently being closely followed by pulmonology and cardiology for persistent SOB with exertion, this has been significantly limiting his daily activity and functioning.  Update 03/04/2023 JM: Returns today accompanied by his wife.  Previously seen by Dr. Gregg after episode of transient alteration of awareness in setting of hypoglycemia, repeat EEG negative.  Hospitalized 6/18 with recurrent transient AMS and unresponsiveness while working with PT (mouth open, eyes open, unresponsive but did blink to loud noise), noted glucose 71, patient given candy and D10 and mentation recovered, during transport to ED patient found unresponsive and suspected seizure lasting 2 minutes with left-sided gaze preference, administered Ativan  and several rounds of D50. MRI brain negative for acute abnormality.  Recurrent event in ED with glucose 51. Noted recently starting on Ozempic  with altered appetite and continued use of insulin .  Repeat EEG unremarkable, evaluated by neurology and felt recurrent events most likely in setting of hypoglycemia.  Reports additional episodes since then, most recently on 9/22. Wife has noticed episodes are occurring with glucose levels now in the 90s. They have set his glucose meter to alert with levels lower than 100. Will return back to baseline after improvement of glucose levels.  Routinely followed by endocrinology, prior visit 8/30, remains on insulin , wife reports they recommended holding Jardiance and lisinopril , has f/u in December. Glucose levels have been ranging 250s-300s on average.   He also c/o occasional headaches, at times can be once a week and other weeks can occur 3-4x, reports continued use of topiramate  50 mg twice daily.   He also reports continued gait difficulty especially with use of stairs, feels this has worsened over the past couple of months. Wife notes patient  dragging right leg.  He has not noticed any weakness, denies lower back or hip pain. Has had a couple falls but thankfully without significant injury. Upon further questioning, reports right hand feeling asleep over the past couple of months, denies weakness. Completed 8 sessions of PT but remainder of PT visits cancelled after hospitalization in June. He is interested in restarting.  Update 10/18/2022 Dr. Gregg: This is a 73 year old gentleman past medical history of hypertension, hyperlipidemia, diabetes mellitus, TIA, previous stroke who is presenting after being admitted to the hospital for an episode of altered mental status/unresponsiveness.  Patient reports being at home, sitting on recliner and his glucose monitor warned him for low blood sugar.  Wife gave him two sips of soda and then he became unresponsive.  Wife reports eyes were open, tongue rolled back, initially unresponsive to touch and he started flailing.  EMS was called, per chart review when EMS arrived he was still hypoglycemic and he received dextrose .  He was taken to the hospital.  His initial workup including head CT was negative.  Wife reports that he did have episodes similar but not this severe and those episodes were not related to low blood sugar.  He had an EEG a year ago which was negative for any epileptic epileptiform discharge but show mild diffuse slowing.  Since discharge from the hospital  he is back to his normal self, no other complaints    Update 08/29/2022 JM: Patient returns for 84-month follow-up accompanied by his wife.  Headaches currently well-controlled on topiramate  50 mg twice daily, notes improvement since prior visit. Unable to say how many per month but per wife, he rarely complains of headaches. Will take Tylenol  as needed with improvement.  He has not had any additional transient episodes of zoning out Does report episode of upper and lower extremity tremor back in February, noted significantly elevated  glucose levels in the 400s, administered insulin  and quickly drank 3 bottles of water , glucose levels dropped rapidly down to the 50s and then symptoms started which subsided quickly.  Did not lose consciousness or had altered awareness.  No other associated symptoms.  Has not had any additional symptoms since that time. Denies any further episodes of rapid changes of glucose levels or hypoglycemia.  Does complain of gait difficulty which is chronic, denies any worsening, does have bilateral lower extremity neuropathy which has been nonprogressive, had a near fall 2 weeks ago, apparently was trying to kneel at church and went down too quickly and landed hard on his knee, he did not fall over or hit his head.  Denies any other falls.  Denies any new stroke/TIA symptoms.  Compliant on aspirin  and Crestor .  Blood pressure well-controlled.  [facility]  Update 03/13/2022 JM: Patient returns for follow-up visit.  Accompanied by his wife.  Headaches: Reports some improvement of headaches not as severe or frequent but still present.  Currently prescribed topiramate  25 mg twice daily, per wife does not take consistently, sometimes will take just as needed. He is willing to take twice daily as prescribed  Transient episodes: He has not had any additional episodes since prior visit.  At prior visit, reported 2 episodes in March and May of zoning out, did not lose consciousness, able to hear people around him, able to follow commands but unable to speak.  Becomes emotional during event.  Last 3 to 4 minutes.  Had fatigue after.  Completed EEG which was negative.  Repeat MRI brain negative for acute findings or changes compared to prior imaging 1 year ago.  Stable from stroke standpoint.  No new stroke/TIA symptoms.  Compliant on aspirin  and Crestor .  Blood pressure well controlled.  [facility]. Has been working on diet, has being losing weight intentionally, cut down on red  meats, eating more chicken, salad, seafood, veggies and fruits.  Is scheduled to see PCP at the [facility] in December.  Update 11/30/2021 JM: Patient returns for follow-up visit after prior visit just about 6 months ago.  Accompanied by his wife.   Prior concerns of left-sided orbital headache have persisted, inflammatory markers negative. Seen by ophthalmologist Dr. Alvia who did not feel headache related to his retinopathy or any other eye condition. Currently, occurring 2-3x per week, previously using Tylenol  with resolution but is unable to continue to use due to altering results of Dexcom meter.  Headaches will typically resolve after laying down and taking a nap.  Occasional photophobia, denies phonophobia or N/V.  Denies any prior history of headaches or migraines or any family history.  Also mentions 2 episodes in March and May of zoning out, does not lose conscousness, able to hear people around him, able to follow commands but unable to speak. Does report sensation of dizziness and hazy vision prior to episodes. Wife notes he becomes emotional during episode which he  is unable to control. Lasts approx 3 to 4 minutes. Reports fatigue after episode. Does mention episode in May, noted hypoglycemic but unable to recall level.  Blood pressure not checked during these times.  No prior diagnosis of seizures or family history of seizures.  Has had prior EEG in 03/2020 which was normal after transient disorientation and speech difficulty in setting of hypoglycemia with resolution of symptoms after correction.  Compliant on aspirin  and Crestor , denies side effects.  Blood pressure today 111/68.  [facility] with [facility] work which has been satisfactory (unable to view via epic).  No further concerns at this time   Update 06/07/2020 JM: Mr. Coufal is here today for a stroke follow-up accompanied by his wife.  Denies new or reoccurring stroke/TIA symptoms. Does report left orbital headache 2-3x  per day present past 2 months - quick sharp pain - only lasts for a few seconds. Will have occasional dull headache in same area relieved by Tylenol . No visual changes.  Denies photophobia, phonophobia or N/V. Does follow with Dr. Octavia and Dr. Will (retina) - has f/u with Dr. Alvia 1/27.  No prior history of headaches or migraines.  Recently established care with endocrinologist Dr. Tommas with improvement of glucose levels.  Prior A1c 7.8 (02/2021) - believes more recent level completed but unknown level -unable to view via epic. Remains on Aspirin  and Crestor  without side effects. More recent lipid panel 01/2021 LDL 41. BP today 128/82.  Does not routinely monitor at home.  No further concerns at this time   UPDATE 03/07/2021 PS: Mr. Kienle is a pleasant 73 year old Caucasian male seen today for initial office follow-up visit following hospital consultation for TIA.  History is obtained from the patient and and wife and have personally reviewed electronic medical records as well as pertinent available imaging films in PACS. Mr. Baltimore has past medical history of diabetes, pretension, hyperlipidemia, obstructive sleep apnea and prior stroke with some residual right-sided weakness and gait difficulty who presented on 01/01/2021 with sudden onset of word finding difficulties, slurred speech and fall due to bilateral right greater than left leg weakness.  He was fine when he went to bed the night prior but when he woke up he could not remember the exact time but noticed that his legs were weak and he had a fall while trying to get up and when he tried to speak his speech was not right.  His speech was slurred and he had trouble getting the words out.  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  He had a prior history of a stroke in March 2020 which was a punctate left medullary infarct for which she was treated with tPA with modest improvement but with some residual right-sided deficits and gait imbalance.  MRI scan of the brain showed no acute abnormality and showed chronic lacunar infarcts in the right basal ganglia carotid ultrasound showed only 1-39% bilateral carotid stenosis.  2D echo showed ejection fraction of 60 to 65% without definite clot.  LDL cholesterol 41 mg percent and hemoglobin A1c was elevated 8.6.  Patient also had diagnosis of a catheter angiogram on 01/03/2021 which showed only moderate 50% bilateral cavernous carotid stenosis and 50  to 60% proximal right PCA stenosis.  Left vertebral artery was occluded in the distal segment.  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  There was felt to be high-grade stenosis of the superior division of the left middle cerebral artery but this was difficult to appreciate on the corresponding CT angio and MR angiogram images and states it was felt that patient should be treated with maximal medical therapy and was discharged on aspirin  and Brilinta  for 3 months..   He states he is doing well is tolerating both medications without significant bruising or bleeding.  Blood pressures under good control today it is 109/72.  Is tolerating Crestor  well without muscle aches and pains.  His sugars however fluctuate quite a bit.  He has an appointment to see endocrinologist to discuss better diabetes control.  Patient does have history of sleep apnea and snores a lot.  However he has tried 4 different CPAP mask and has not been able to tolerate them.  He sees a neurologist in the [facility] system and plans to discuss alternative treatment options at next visit.  He is currently doing outpatient physical occupational therapy.  He still has mild paresthesias in his feet from diabetic neuropathy.  He  is also getting some outpatient occupational therapy for right frozen shoulder at the [facility].  He has no new complaints.    ROS:   14 system review of systems is positive for those listed in HPI and all other systems negative  PMH:  Past Medical History:  Diagnosis Date   ADHD (attention deficit hyperactivity disorder)    CVA (cerebral vascular accident) (HCC) 2022   Diabetes mellitus    Hyperlipidemia    Hypertension    Renal disorder    Seizures (HCC)    Stroke (HCC)     Social History:  Social History   Socioeconomic History   Marital status: Married    Spouse name: Devere   Number of children: 2   Years of education: Not on file   Highest education level: Not on file  Occupational History   Occupation: Archivist  Tobacco Use   Smoking status: Never   Smokeless tobacco: Never  Vaping Use   Vaping status: Never Used  Substance and Sexual Activity   Alcohol use: Never   Drug use: No   Sexual activity: Yes  Other Topics Concern   Not on file  Social History Narrative   Lives with wife   Social Drivers of Health   Financial Resource Strain: Not on file  Food Insecurity: No Food Insecurity (11/20/2022)   Hunger Vital Sign    Worried About Running Out of Food in the Last Year: Never true    Ran Out of Food in the Last Year: Never true  Transportation Needs: No Transportation Needs (11/20/2022)   PRAPARE - Administrator, Civil Service (Medical): No    Lack of Transportation (Non-Medical): No  Physical Activity: Not on file  Stress: Not on file  Social Connections: Not on file  Intimate Partner Violence: Not At Risk (11/20/2022)   Humiliation, Afraid, Rape, and Kick questionnaire    Fear of Current or Ex-Partner: No    Emotionally Abused: No    Physically Abused: No    Sexually Abused: No    Medications:   Current Outpatient Medications on File Prior to Visit  Medication Sig Dispense Refill   Accu-Chek Softclix Lancets lancets  4 (four) times daily.     acetaminophen  (  TYLENOL ) 500 MG tablet Take 500-1,000 mg by mouth every 6 (six) hours as needed for headache or moderate pain (pain score 4-6).     amitriptyline  (ELAVIL ) 10 MG tablet Take 1 tablet (10 mg total) by mouth at bedtime. 30 tablet 11   amLODipine  (NORVASC ) 2.5 MG tablet Take 1 tablet (2.5 mg total) by mouth daily. 30 tablet 2   aspirin  EC 81 MG tablet Take 1 tablet (81 mg total) by mouth daily. Swallow whole.     Cholecalciferol (VITAMIN D) 50 MCG (2000 UT) tablet Take 2,000 Units by mouth daily.     clopidogrel  (PLAVIX ) 75 MG tablet Take 1 tablet (75 mg total) by mouth daily. 90 tablet 1   cycloSPORINE (RESTASIS) 0.05 % ophthalmic emulsion Place 1 drop into both eyes 2 (two) times daily.     empagliflozin (JARDIANCE) 10 MG TABS tablet Take 10 mg by mouth daily.     furosemide  (LASIX ) 20 MG tablet Take 0.5 tablets (10 mg total) by mouth daily as needed for edema (Leg swelling). 30 tablet 1   Glucagon  1 MG/0.2ML SOAJ Inject 1 mg into the skin as needed (low blood sugar). 0.2 mL 0   glucose blood (ONETOUCH VERIO) test strip 1 each by Other route 4 (four) times daily -  before meals and at bedtime. dexcom 6     insulin  aspart (NOVOLOG  FLEXPEN) 100 UNIT/ML FlexPen Inject 0-20 Units into the skin See admin instructions. Sliding scale per endocrinologist. (3 times with each meal) Less than 100 = 0  100 -150 = 5 units  150-200 = 8 units   200-250 =12 units (Patient taking differently: 0-20 Units See admin instructions. Pump) 15 mL 11   levothyroxine  (SYNTHROID ) 50 MCG tablet Take 50 mcg by mouth daily before breakfast.     Magnesium Oxide 420 MG TABS Take 420 mg by mouth 2 (two) times daily.     Multiple Vitamins-Minerals (ICAPS AREDS 2 PO) Take 1 capsule by mouth in the morning and at bedtime.     nitroGLYCERIN  (NITROSTAT ) 0.4 MG SL tablet Place 1 tablet (0.4 mg total) under the tongue every 5 (five) minutes as needed for chest pain. 25 tablet 3   pantoprazole   (PROTONIX ) 40 MG tablet Take 1 tablet (40 mg total) by mouth daily.     PRESCRIPTION MEDICATION Medication  Study for cholesterol, injection on [address] with Dr. Every eight weeks Last visit 02/26- next appointment [facility]     rosuvastatin  (CRESTOR ) 40 MG tablet Take 1 tablet (40 mg total) by mouth at bedtime. 90 tablet 3   Semaglutide , 1 MG/DOSE, (OZEMPIC , 1 MG/DOSE,) 2 MG/1.5ML SOPN Inject 2 mg into the skin every Monday.     sodium bicarbonate 650 MG tablet Take 650 mg by mouth 2 (two) times daily.     tamsulosin  (FLOMAX ) 0.4 MG CAPS capsule Take 1 capsule (0.4 mg total) by mouth daily. 30 capsule 0   topiramate  (TOPAMAX ) 100 MG tablet Take 1 tablet (100 mg total) by mouth 2 (two) times daily. 60 tablet 5   No current facility-administered medications on file prior to visit.    Allergies:   Allergies  Allergen Reactions   Flexeril [Cyclobenzaprine Hcl] Other (See Comments)    Causes him to pass out and lowers his BP   Lidocaine Swelling    throat   Novocain [Procaine Hcl] Swelling    Physical Exam Today's Vitals   03/11/24 1410  BP: 118/68  Pulse: 80  SpO2: 96%  Weight:  190 lb (86.2 kg)  Height: 5' 8 (1.727 m)    Body mass index is 28.89 kg/m.   General: well developed, well nourished very pleasant elderly Caucasian male, seated, in no evident distress  Neurologic Exam Mental Status: Awake and fully alert.  Fluent speech and language.  Oriented to place and time. Recent and remote memory intact. Attention span, concentration and fund of knowledge appropriate. Mood and affect appropriate.  Cranial Nerves: Pupils equal, briskly reactive to light. Extraocular movements full without nystagmus. Visual fields full to confrontation. Hearing intact. Facial sensation intact. Tongue, palate moves normally and symmetrically. Right lower facial weakness - new since prior visit Motor: Normal strength, bulk and tone left upper and lower extremity. RUE 4/5 deltoid  (chronic from prior shoulder surgery), otherwise 5/5.  Possibly very slight RLE weakness but also with giveaway weakness. R>L postural tremor, intermittent slight RUE resting tremor.  No tremor in lower extremities or face.  Slightly decreased finger tapping RUE, mild rigidity. Sensory.: intact to touch ,pinprick but slightly impaired.position and vibratory sensation over both feet bilaterally..   Coordination: Rapid alternating movements decreased right hand. Finger-to-nose and heel-to-shin mild right sided ataxia - chronic Gait and Station: Arises from chair without difficulty. Stance is slightly stooped.  Gait abnormality with decreased stride length and step height of RLE, can lift higher when walking but challenging, imbalance greater with turns.  Decreased arm swing bilaterally.  Tandem walk and heel toe not attempted Reflexes: 1+ and symmetric. Toes downgoing.        ASSESSMENT/PLAN: JADARIAN MCKAY is a 73 y.o. year old male with left cerebellum DWI negative infarct s/p TPA on 08/09/2018 secondary to left VA occlusion versus arthrosclerosis given risk factors and ICA siphon atherosclerosis and residual right-sided ataxia and gait impairment, and left hemispheric TIA 12/2020.  Vascular risk factors include HTN, HLD, CAD s/p cardiac cath 09/2023 and 02/2024, intracranial stenosis and DM. Onset of left orbital headaches starting around 04/2021 and recurrent transient altered awareness initially associated with hypoglycemia but since beginning of 2025, not associated with hypoglycemia. Onset in 02/2023 of right sided weakness and subjective right hand numbness with worsening gait and dragging of RLE and worsening headaches over the past 2 months of unclear etiology with MRI brain no acute abnormality.     Hx of stroke and TIA Right sided deficits and gait impairment. Stable.  MRI brain 04/2023 no acute abnormalities Continuation of aspirin  and Crestor  for secondary stroke prevention managed/prescribed by  PCP Continue to follow with PCP and endocrinology for aggressive stroke risk factor management  Orbital headaches Greatly improved over the past 6 months Continue amitriptyline  10 mg nightly Continue topiramate  100 mg twice daily Continue Tylenol  as needed Negative inflammatory markers Does have hx of OSA with CPAP intolerance, he was [facility] to [facility] other [facility] options as untreated apnea may be contributing to persistent headaches and especially morning headaches.   Transient episodes Question possible atypical migraine, previously evaluated by Dr. Gregg who witnessed one of the episodes and did not believe they are epileptic Most recent episodes occurring during cardiac procedures possibly triggering Advised wife to continue to monitor Advise close follow-up with endocrinology for adequate glucose control EEG 727/2023, 11/08/2022 and 11/21/2022 no definite epileptiform activity noted  MR brain 11/2022 no acute abnormality Continue on topiramate  100 mg twice daily (rx'd for HA prevention)  Diabetic neuropathy Gradual worsening Following with endocrinology Discussed increasing amitriptyline  which could potentially help nerve pain but declines interest  at this time Discussed trial of Nervive for possible benefit  Carotid artery stenosis Left vertebral artery occlusion, chronic  Will plan on repeat CUS in 2026 for surveillance monitoring Carotid ultrasound 09/2022 bilateral ICA 1 to 39% stenosis, R VA antegrade flow, L VA high resistant flow Carotid ultrasound 01/2021 bilateral ICA 1 to 39% stenosis, R VA antegrade flow, L VA high resistant flow     Follow-up in 6-9 months or call earlier if needed    CC:  [facility]   I personally spent a total of 45 minutes in the care of the patient today including preparing to see the patient, getting/reviewing separately obtained history, performing a medically appropriate exam/evaluation,  counseling and educating, placing orders, and documenting clinical information in the EHR.   Harlene Bogaert, AGNP-BC  [facility] [address] [place], [place] [address]  Phone [phone] Fax [phone] Note: This document was prepared with digital dictation and possible smart phrase technology. Any transcriptional errors that result from this process are unintentional.

## 2024-03-11 NOTE — Patient Instructions (Addendum)
 Your Plan:  Consider increasing amitriptyline  dosage to further help with neuropathy symptoms   Continue topamax  100mg  twice daily  Consider starting Nervive over the counter supplement for further help with nerve pain  Please continue to monitor seizure like episodes - please let me know if these continue to occur        Follow up in 6-9 months or call earlier if needed      Thank you for coming to see us  at [facility]. I hope we have been able to provide you high quality care today.  You may receive a patient satisfaction survey over the next few weeks. We would appreciate your feedback and comments so that we may continue to improve ourselves and the health of our patients.

## 2024-03-12 ENCOUNTER — Other Ambulatory Visit: Payer: Self-pay | Admitting: *Deleted

## 2024-03-12 MED ORDER — AMITRIPTYLINE HCL 10 MG PO TABS: 10.0000 mg | ORAL_TABLET | Freq: Every day | ORAL | 3 refills | Status: AC

## 2024-03-12 MED ORDER — TOPIRAMATE 100 MG PO TABS: 100.0000 mg | ORAL_TABLET | Freq: Two times a day (BID) | ORAL | 3 refills | Status: AC

## 2024-03-16 ENCOUNTER — Ambulatory Visit: Admitting: Adult Health

## 2024-03-17 ENCOUNTER — Ambulatory Visit: Attending: Cardiology | Admitting: Cardiology

## 2024-03-17 ENCOUNTER — Encounter: Payer: Self-pay | Admitting: Cardiology

## 2024-03-17 VITALS — BP 110/62 | HR 77 | Resp 16 | Ht 68.0 in | Wt 198.6 lb

## 2024-03-17 DIAGNOSIS — E1122 Type 2 diabetes mellitus with diabetic chronic kidney disease: Secondary | ICD-10-CM

## 2024-03-17 DIAGNOSIS — I25118 Atherosclerotic heart disease of native coronary artery with other forms of angina pectoris: Secondary | ICD-10-CM | POA: Diagnosis not present

## 2024-03-17 DIAGNOSIS — E782 Mixed hyperlipidemia: Secondary | ICD-10-CM

## 2024-03-17 DIAGNOSIS — N1832 Chronic kidney disease, stage 3b: Secondary | ICD-10-CM

## 2024-03-17 DIAGNOSIS — I10 Essential (primary) hypertension: Secondary | ICD-10-CM | POA: Diagnosis not present

## 2024-03-17 MED ORDER — LISINOPRIL 10 MG PO TABS: 10.0000 mg | ORAL_TABLET | Freq: Every day | ORAL | 3 refills | Status: AC

## 2024-03-17 NOTE — Progress Notes (Signed)
 Cardiology Office Note:  .   Date:  03/17/2024  ID:  Jeff [name], DOB 08/20/1950, MRN [id number] PCP: [facility]  [facility] Providers Cardiologist:  Gordy Bergamo, MD   History of Present Illness: .   Jeff [name] is a 73 y.o.  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  Due to recurrence of dyspnea, underwent repeat cardiac catheterization on 02/05/2024 and underwent successful stenting of the dominant proximal to mid CX with implantation of a 3.0 x 16 mm Synergy XD DES.  Now presents for follow-up and states that he has had complete resolution of his fatigue and dyspnea and is excited to start cardiac rehab.  Cardiac Studies relevent.    Cardiac Catheterization 02/05/24:  Mid CX between OM1 and OM 2 with implantation of a 3.0 x 16 mm Synergy XD DES  Mid LAD 3.5 x 28 mm Synergy XD DES placed for 09/11/2023 is widely patent     ECHOCARDIOGRAM COMPLETE 01/02/2021  Left ventricular ejection fraction, by estimation, is 60 to 65%. The left ventricle has normal function. The left ventricle has no regional wall motion abnormalities. Left ventricular diastolic parameters were normal.     Discussed the use of AI scribe software for clinical note transcription with the patient, who gave verbal consent to proceed.  History of Present Illness Jeff [name] is a 73 year old male with coronary artery disease who presents for follow-up after cardiac catheterization.  He feels well and is eager to start cardiac  rehabilitation to regain his previous activity level. Cardiac catheterization on February 05, 2024, showed open stents in the LAD and obtuse marginal arteries, with a lesion in the circumflex coronary artery.  He is on aspirin  81 mg daily and clopidogrel  4 mg daily. Rosuvastatin  40 mg daily is managing his cholesterol, with an LDL level of 62 mg/dL. He will resume lisinopril  10 mg daily for blood pressure management, having tolerated it well previously. Lasix  is taken as needed, infrequently.  He reports no chest pain, shortness of breath, or palpitations. He is planning a trip to [place]  and is considering using a scooter for mobility due to anticipated extensive walking.  Labs   Lab Results  Component Value Date   CHOL 134 09/30/2023   HDL 43 09/30/2023   LDLCALC 62 09/30/2023   TRIG 170 (H) 09/30/2023   CHOLHDL 3.1 09/30/2023   No results found for: LIPOA  Recent Labs    07/19/23 1426 08/27/23 1550 09/11/23 0945 09/11/23 0949 09/11/23 0955 09/30/23 1229  NA 137 140   < > 142  140 140 142  K 3.6 3.7   < > 3.6  3.9 3.9 3.9  CL 104 104  --   --   --  106  CO2 21* 22  --   --   --  CANCELED  GLUCOSE 175* 136*  --   --   --  61*  BUN 21 19  --   --   --  18  CREATININE 1.69* 1.56*  --   --   --  1.44*  CALCIUM  9.5 9.1  --   --   --  9.6  GFRNONAA 42*  --   --   --   --   --    < > = values in this interval not displayed.    Lab Results  Component Value Date   ALT 33 07/19/2023   AST 25 07/19/2023   ALKPHOS 57 07/19/2023   BILITOT 0.6 07/19/2023      Latest Ref Rng & Units 09/11/2023    9:55 AM 09/11/2023    9:49 AM 09/11/2023    9:45 AM  CBC  Hemoglobin 13.0 - 17.0 g/dL 85.3  85.3    86.0  84.9    14.6   Hematocrit 39.0 - 52.0 % 43.0  43.0    41.0  44.0    43.0    Lab Results  Component Value Date   HGBA1C 6.7 (H) 11/20/2022    Lab Results  Component Value Date   TSH 1.915 02/27/2014    ROS  Review of Systems  Cardiovascular:  Negative for chest pain,  dyspnea on exertion and leg swelling.   Physical Exam:   VS:  BP 110/62 (BP Location: Left Arm, Patient Position: Sitting, Cuff Size: Normal)   Pulse 77   Resp 16   Ht 5' 8 (1.727 m)   Wt 198 lb 9.6 oz (90.1 kg)   SpO2 97%   BMI 30.20 kg/m    Wt Readings from Last 3 Encounters:  03/17/24 198 lb 9.6 oz (90.1 kg)  03/11/24 190 lb (86.2 kg)  02/05/24 189 lb (85.7 kg)    BP Readings from Last 3 Encounters:  03/17/24 110/62  03/11/24 118/68  02/05/24 (!) 132/92   Physical Exam Neck:     Vascular: No carotid bruit or JVD.  Cardiovascular:     Rate and Rhythm: Normal rate and regular rhythm.     Pulses:          Dorsalis pedis pulses are 2+ on the right side and 0 on the left side.       Posterior tibial pulses are 2+ on the right side and 0 on the left side.     Heart sounds: Normal heart sounds. No murmur heard.    No gallop.  Pulmonary:     Effort: Pulmonary effort is normal.     Breath sounds: Normal breath sounds.  Abdominal:     General: Bowel sounds are normal.     Palpations: Abdomen is soft.  Musculoskeletal:     Right lower leg: No edema.     Left lower leg: No edema.    EKG:         ASSESSMENT AND PLAN: .      ICD-10-CM   1. Coronary artery disease involving native coronary artery of native heart with other form of angina pectoris  I25.118     2. Primary hypertension  I10 lisinopril  (ZESTRIL ) 10 MG tablet    3. Mixed hyperlipidemia  E78.2     4. Type 2 diabetes mellitus with stage 3b chronic kidney disease, without long-term current use of insulin  (HCC)  E11.22 lisinopril  (ZESTRIL ) 10 MG tablet   N18.32      Assessment & Plan Atherosclerotic heart disease with prior stenting and recent PCI Underwent cardiac catheterization on February 05, 2024, revealing open stents in the LAD and obtuse marginal artery. A lesion in the circumflex coronary artery was treated, resulting in well-vascularized arteries with no risk of blockages. Reports feeling  well and  is eager to start cardiac rehabilitation. - Continue aspirin  81 mg daily until March 2026 - Continue clopidogrel  75 mg daily indefinitely - Initiate cardiac rehabilitation - Communicate with cardiac rehab to confirm clearance for participation  Type 2 diabetes mellitus, well controlled Diabetes is well controlled with an A1c of 6.7%. Current medications include Ozempic  and Jardiance, which also provide renal protection.  Chronic kidney disease stage 3 (A-B) Chronic kidney disease remains stable. Current medications include Jardiance, which offers renal protection.  Mixed hyperlipidemia Cholesterol levels are well managed with rosuvastatin  40 mg daily. LDL is at 62, which is below the target of 70.  Hypertension, currently managed with amlodipine  2.5 mg daily Blood pressure is well controlled at 110/62 mmHg. Amlodipine  is being discontinued, and lisinopril  10 mg is being reintroduced to provide renal protection. - Discontinue amlodipine  - Re start lisinopril  10 mg daily  Follow up: 6 months for CAD, hypertension and hypercholesterolemia  Signed,  Gordy Bergamo, MD, [facility] 03/17/2024, 1:31 PM [facility] [address] [place], [place] [address] Phone: [phone]. Fax:  [phone]

## 2024-03-17 NOTE — Patient Instructions (Signed)
 Medication Instructions:  STOP Amlodipine    Start lisinopril  -  Take 1 tablet (10 mg total) by mouth daily  *If you need a refill on your cardiac medications before your next appointment, please call your pharmacy*  Lab Work: NONE If you have labs (blood work) drawn today and your tests are completely normal, you will receive your results only by: MyChart Message (if you have MyChart) OR A paper copy in the mail If you have any lab test that is abnormal or we need to change your treatment, we will call you to review the results.  Testing/Procedures: NONE  Follow-Up: At [facility], you and your health needs are our priority.  As part of our continuing mission to provide you with exceptional heart care, our providers are all part of one team.  This team includes your primary Cardiologist (physician) and Advanced Practice Providers or APPs (Physician Assistants and Nurse Practitioners) who all work together to provide you with the care you need, when you need it.  Your next appointment:   6 Months   Provider:   Gordy Bergamo, MD    We recommend signing up for the patient portal called MyChart.  Sign up information is provided on this After Visit Summary.  MyChart is used to connect with patients for Virtual Visits (Telemedicine).  Patients are able to view lab/test results, encounter notes, upcoming appointments, etc.  Non-urgent messages can be sent to your provider as well.   To learn more about what you can do with MyChart, go to [email or web address].

## 2024-03-31 ENCOUNTER — Telehealth (HOSPITAL_COMMUNITY): Payer: Self-pay

## 2024-03-31 NOTE — Telephone Encounter (Signed)
 Spoke to patient's wife (DPR on file) regarding us  still not having VA authorization for cardiac rehab. She states they will reach out to [place] and if they are not successful they will use his [facility] on file instead.

## 2024-03-31 NOTE — Telephone Encounter (Signed)
 Patient left message checking on status of referral. We have not received new auth from [place] for cardiac rehab. Attempted to call [facility], left message for [employer] regarding having a new auth sent that includes cardiac rehab.

## 2024-04-02 ENCOUNTER — Encounter: Payer: Self-pay | Admitting: Cardiology

## 2024-05-05 ENCOUNTER — Telehealth (HOSPITAL_COMMUNITY): Payer: Self-pay

## 2024-05-05 NOTE — Telephone Encounter (Signed)
 F/u regarding cardiac rehab, spoke with patient's wife. She stated that since the [place] has not sent an authorization for CR they will proceed with their [facility].   Patient will come in for orientation on 12/301 and will attend the 1:45 exercise class.  Pensions consultant.

## 2024-05-13 ENCOUNTER — Encounter (INDEPENDENT_AMBULATORY_CARE_PROVIDER_SITE_OTHER): Admitting: Ophthalmology

## 2024-05-13 DIAGNOSIS — Z794 Long term (current) use of insulin: Secondary | ICD-10-CM

## 2024-05-13 DIAGNOSIS — I10 Essential (primary) hypertension: Secondary | ICD-10-CM

## 2024-05-13 DIAGNOSIS — H35033 Hypertensive retinopathy, bilateral: Secondary | ICD-10-CM | POA: Diagnosis not present

## 2024-05-13 DIAGNOSIS — Z7985 Long-term (current) use of injectable non-insulin antidiabetic drugs: Secondary | ICD-10-CM | POA: Diagnosis not present

## 2024-05-13 DIAGNOSIS — H43813 Vitreous degeneration, bilateral: Secondary | ICD-10-CM | POA: Diagnosis not present

## 2024-05-13 DIAGNOSIS — E113393 Type 2 diabetes mellitus with moderate nonproliferative diabetic retinopathy without macular edema, bilateral: Secondary | ICD-10-CM

## 2024-05-25 ENCOUNTER — Telehealth (HOSPITAL_COMMUNITY): Payer: Self-pay

## 2024-05-25 NOTE — Telephone Encounter (Signed)
 Pt insurance is active and benefits verified through [facility]. Co-pay $20, DED $0/$0 met, out of pocket $4,000/$485.23 met, co-insurance 0%. No pre-authorization required. Passport, 05/25/2024 @ 1:14pm, REF# [id number].  TCR/ICR? ICR Visit(date of service)limitation? No Can multiple codes be used on the same date of service/visit?(IF ITS A LIMIT) N/A  Is this a lifetime maximum or an annual maximum? Annual Has the member used any of these services to date? No Is there a time limit (weeks/months) on start of program and/or program completion? No

## 2024-06-02 ENCOUNTER — Encounter (HOSPITAL_COMMUNITY)
Admission: RE | Admit: 2024-06-02 | Discharge: 2024-06-02 | Disposition: A | Discharge status: Home / Self Care | Source: Ambulatory Visit | Attending: Cardiology | Admitting: Cardiology

## 2024-06-02 VITALS — BP 110/66 | HR 72 | Ht 68.5 in | Wt 204.1 lb

## 2024-06-02 DIAGNOSIS — Z955 Presence of coronary angioplasty implant and graft: Secondary | ICD-10-CM | POA: Diagnosis present

## 2024-06-02 NOTE — Progress Notes (Signed)
 Patient was noted by Alm EP reported feeling lightheaded after completing Jeff 6 minute walk test. Patient was noted to have a blank stare and was unresponsive. Patient's [name] near by and said the patient was having an absent seizure. It took the patient a few minutes to come back around. Blood pressure initially 82/60. After a few minutes of verifying responsiveness. Once the patient was awake he cried for a few minutes. Patient was placed on stretcher and was taken to the treatment room. CBG 145. Recheck BP 125/107. Patient placed on zoll Sinus Rhythm rate 70.Oxygen saturation 98% on room air. Onsite provider [name] [facility] came in and evaluated the patient. Neuro exam performed. PEARL. Grips equal. Awake and oriented times 3. Jeff [name] and Jeff [name] said they do not want to go to the emergency room for evaluation at this time. Patient's [name] has obtained an appointment to see Jeff nephrologist on 06/23/23. Will hold off on proceeding with exercise at cardiac rehab until he is seen by [name] PAC in the office.I did attempt to call [name] to notify about today's event. Unable to get through. Will forward today's events to [name], [name] and [name]. Recheck exit vital signs are as follows. BP 118/70 heart rate 73. Oxygen saturation 96% on room. Patient was taken to Jeff car via transport chair. Patient does not drive [name]. Patient knows if he has another seizure to report to the ED and notify [facility] about any further seizures. Patient and Jeff [name] state understanding. No complaints or symptoms upon exit from cardiac rehab.Hadassah Elpidio Quan RN BSN

## 2024-06-02 NOTE — Progress Notes (Signed)
 Cardiac Individual Treatment Plan  Patient Details  Name: Jeff [name] MRN: [id number] Date of Birth: 01/15/1951 Referring Provider:   Flowsheet Row INTENSIVE CARDIAC REHAB ORIENT from 06/02/2024 in [facility]  Referring Provider Gordy Bergamo, MD    Initial Encounter Date:  Flowsheet Row INTENSIVE CARDIAC REHAB ORIENT from 06/02/2024 in [facility], [facility]  Date 06/02/24    Visit Diagnosis: 02/05/24 Status post coronary artery stent placement  Patient's Home Medications on Admission: Current Medications[1]  Past Medical History: Past Medical History:  Diagnosis Date   ADHD (attention deficit hyperactivity disorder)    CVA (cerebral vascular accident) (HCC) 2022   Diabetes mellitus    Hyperlipidemia    Hypertension    Renal disorder    Seizures (HCC)    Stroke (HCC)     Tobacco Use: Tobacco Use History[2]  Labs: Review Flowsheet  More data exists      Latest Ref Rng & Units 03/11/2020 01/02/2021 11/20/2022 09/11/2023 09/30/2023  Labs for ITP Cardiac and Pulmonary Rehab  Cholestrol 100 - 199 mg/dL 894  96  - - 865   LDL (calc) 0 - 99 mg/dL 45  41  - - 62   HDL-C >39 mg/dL 29  25  - - 43   Trlycerides 0 - 149 mg/dL 842  847  - - 829   Hemoglobin A1c 4.8 - 5.6 % 6.8  8.6  6.7  - -  PH, Arterial 7.35 - 7.45 - - - 7.309  -  PCO2 arterial 32 - 48 mmHg - - - 36.5  -  Bicarbonate 20.0 - 28.0 mmol/L - - 26.6  19.4  18.4  19.9  18.9  19.8  19.7  -  TCO2 22 - 32 mmol/L - - 20  19  21  20  21  21   -  Acid-base deficit 0.0 - 2.0 mmol/L - - 5.0  7.0  6.0  7.0  6.0  6.0  -  O2 Saturation % - - 42.3  93  92  72  70  69  68  -    Details       Multiple values from one day are sorted in reverse-chronological order         Capillary Blood Glucose: Lab Results  Component Value Date   GLUCAP 98 02/05/2024   GLUCAP 145 (H) 02/05/2024   GLUCAP 204 (H) 09/11/2023   GLUCAP 204  (H) 09/11/2023   GLUCAP 237 (H) 09/11/2023     Exercise Target Goals: Exercise Program Goal: Individual exercise prescription set using results from initial 6 min walk test and THRR while considering  patients activity barriers and safety.   Exercise Prescription Goal: Initial exercise prescription builds to 30-45 minutes a day of aerobic activity, 2-3 days per week.  Home exercise guidelines will be given to patient during program as part of exercise prescription that the participant will acknowledge.  Activity Barriers & Risk Stratification:  Activity Barriers & Cardiac Risk Stratification - 06/02/24 0938       Activity Barriers & Cardiac Risk Stratification   Activity Barriers Balance Concerns;History of Falls;Back Problems;Deconditioning;Shortness of Breath;Neck/Spine Problems;Assistive Device;Other (comment);Muscular Weakness    Comments Pt has h/o Absent Seziures    Cardiac Risk Stratification High          6 Minute Walk:  6 Minute Walk     Row Name 06/02/24 1146  6 Minute Walk   Phase Initial  Pt used Go-cart     Distance 914 feet     Walk Time 6 minutes     # of Rest Breaks 1  3:15-4:58     MPH 1.7     METS 1.5     RPE 11     Perceived Dyspnea  1     VO2 Peak 5.2     Symptoms Yes (comment)     Comments c/o 8/10 bilateral arm pain form leaning on the go-cart during the walk test. During cool-down, pt experienced an Absent seziure - subsequently taken to Tx room. see note in EPIC.     Resting HR 75 bpm     Resting BP 110/66     Resting Oxygen Saturation  95 %     Exercise Oxygen Saturation  during 6 min walk 96 %     Max Ex. HR 102 bpm     Max Ex. BP 118/70     2 Minute Post BP 108/60        Oxygen Initial Assessment:   Oxygen Re-Evaluation:   Oxygen Discharge (Final Oxygen Re-Evaluation):   Initial Exercise Prescription:  Initial Exercise Prescription - 06/02/24 0900       Date of Initial Exercise RX and Referring Provider   Date  06/02/24    Referring Provider Gordy Bergamo, MD    Expected Discharge Date 08/27/23      NuStep   Level 1    SPM 70    Minutes 20    METs 1.7      Prescription Details   Frequency (times per week) 3    Duration Progress to 30 minutes of continuous aerobic without signs/symptoms of physical distress      Intensity   THRR 40-80% of Max Heartrate 58-116    Ratings of Perceived Exertion 11-13    Perceived Dyspnea 0-4      Progression   Progression Continue progressive overload as per policy without signs/symptoms or physical distress.      Resistance Training   Training Prescription Yes    Weight 2 lbs    Reps 10-15          Perform Capillary Blood Glucose checks as needed.  Exercise Prescription Changes:   Exercise Comments:   Exercise Goals and Review:   Exercise Goals     Row Name 06/02/24 0947             Exercise Goals   Increase Physical Activity Yes       Intervention Provide advice, education, support and counseling about physical activity/exercise needs.;Develop an individualized exercise prescription for aerobic and resistive training based on initial evaluation findings, risk stratification, comorbidities and participant's personal goals.       Expected Outcomes Short Term: Attend rehab on a regular basis to increase amount of physical activity.;Long Term: Exercising regularly at least 3-5 days a week.;Long Term: Add in home exercise to make exercise part of routine and to increase amount of physical activity.       Increase Strength and Stamina Yes       Intervention Provide advice, education, support and counseling about physical activity/exercise needs.;Develop an individualized exercise prescription for aerobic and resistive training based on initial evaluation findings, risk stratification, comorbidities and participant's personal goals.       Expected Outcomes Short Term: Increase workloads from initial exercise prescription for resistance, speed, and  METs.;Short Term: Perform resistance training exercises routinely during rehab and  add in resistance training at home;Long Term: Improve cardiorespiratory fitness, muscular endurance and strength as measured by increased METs and functional capacity ( )       Able to understand and use rate of perceived exertion (RPE) scale Yes       Intervention Provide education and explanation on how to use RPE scale       Expected Outcomes Short Term: Able to use RPE daily in rehab to express subjective intensity level;Long Term:  Able to use RPE to guide intensity level when exercising independently       Knowledge and understanding of Target Heart Rate Range (THRR) Yes       Intervention Provide education and explanation of THRR including how the numbers were predicted and where they are located for reference       Expected Outcomes Short Term: Able to state/look up THRR;Long Term: Able to use THRR to govern intensity when exercising independently;Short Term: Able to use daily as guideline for intensity in rehab       Understanding of Exercise Prescription Yes       Intervention Provide education, explanation, and written materials on patient's individual exercise prescription       Expected Outcomes Short Term: Able to explain program exercise prescription;Long Term: Able to explain home exercise prescription to exercise independently          Exercise Goals Re-Evaluation :   Discharge Exercise Prescription (Final Exercise Prescription Changes):   Nutrition:  Target Goals: Understanding of nutrition guidelines, daily intake of sodium 1500mg , cholesterol 200mg , calories 30% from fat and 7% or less from saturated fats, daily to have 5 or more servings of fruits and vegetables.  Biometrics:  Pre Biometrics - 06/02/24 0850       Pre Biometrics   Waist Circumference 45.5 inches    Hip Circumference 46 inches    Waist to Hip Ratio 0.99 %    Triceps Skinfold 10 mm    % Body Fat 29.8 %    Grip  Strength 22 kg    Flexibility 0 in   could not reach   Single Leg Stand --   No performed, h/o falls and balance problems          Nutrition Therapy Plan and Nutrition Goals:   Nutrition Assessments:  MEDIFICTS Score Key: >=70 Need to make dietary changes  40-70 Heart Healthy Diet <= 40 Therapeutic Level Cholesterol Diet    Picture Your Plate Scores: <59 Unhealthy dietary pattern with much room for improvement. 41-50 Dietary pattern unlikely to meet recommendations for good health and room for improvement. 51-60 More healthful dietary pattern, with some room for improvement.  >60 Healthy dietary pattern, although there may be some specific behaviors that could be improved.    Nutrition Goals Re-Evaluation:   Nutrition Goals Re-Evaluation:   Nutrition Goals Discharge (Final Nutrition Goals Re-Evaluation):   Psychosocial: Target Goals: Acknowledge presence or absence of significant depression and/or stress, maximize coping skills, provide positive support system. Participant is able to verbalize types and ability to use techniques and skills needed for reducing stress and depression.  Initial Review & Psychosocial Screening:  Initial Psych Review & Screening - 06/02/24 0949       Initial Review   Current issues with Current Sleep Concerns      Family Dynamics   Good Support System? Yes   Pt has spouse, Devere for support     Barriers   Psychosocial barriers to participate in program There are no identifiable  barriers or psychosocial needs.      Screening Interventions   Interventions Encouraged to exercise    Expected Outcomes Short Term goal: Identification and review with participant of any Quality of Life or Depression concerns found by scoring the questionnaire.;Long Term goal: The participant improves quality of Life and PHQ9 Scores as seen by post scores and/or verbalization of changes          Quality of Life Scores:  Quality of Life - 06/02/24 1151        Quality of Life   Select Quality of Life      Quality of Life Scores   Health/Function Pre 25.46 %    Socioeconomic Pre 25 %    Psych/Spiritual Pre 29 %    Family Pre 29.5 %    GLOBAL Pre 26.83 %         Scores of 19 and below usually indicate a poorer quality of life in these areas.  A difference of  2-3 points is a clinically meaningful difference.  A difference of 2-3 points in the total score of the Quality of Life Index has been associated with significant improvement in overall quality of life, self-image, physical symptoms, and general health in studies assessing change in quality of life.  PHQ-9: Review Flowsheet  More data exists      06/02/2024 11/03/2018 10/30/2018 10/02/2018 12/30/2017  Depression screen PHQ 2/9  Decreased Interest 0 0 0 0 0  Down, Depressed, Hopeless 0 0 0 0 0  PHQ - 2 Score 0 0 0 0 0  Altered sleeping 2 - - 3 -  Tired, decreased energy 1 - - 3 -  Change in appetite 1 - - 0 -  Feeling bad or failure about yourself  0 - - 0 -  Trouble concentrating 0 - - 0 -  Moving slowly or fidgety/restless 0 - - 0 -  Suicidal thoughts 0 - - 0 -  PHQ-9 Score 4 - - 6  -  Difficult doing work/chores Not difficult at all - - - -    Details       Data saved with a previous flowsheet row definition        Interpretation of Total Score  Total Score Depression Severity:  1-4 = Minimal depression, 5-9 = Mild depression, 10-14 = Moderate depression, 15-19 = Moderately severe depression, 20-27 = Severe depression   Psychosocial Evaluation and Intervention:   Psychosocial Re-Evaluation:   Psychosocial Discharge (Final Psychosocial Re-Evaluation):   Vocational Rehabilitation: Provide vocational rehab assistance to qualifying candidates.   Vocational Rehab Evaluation & Intervention:  Vocational Rehab - 06/02/24 0951       Initial Vocational Rehab Evaluation & Intervention   Assessment shows need for Vocational Rehabilitation No      Vocational  Rehab Re-Evaulation   Comments Pt is retired          Education: Education Goals: Education classes will be provided on a weekly basis, covering required topics. Participant will state understanding/return demonstration of topics presented.     Core Videos: Exercise    Move It!  Clinical staff conducted group or individual video education with verbal and written material and guidebook.  Patient learns the recommended Pritikin exercise program. Exercise with the goal of living a long, healthy life. Some of the health benefits of exercise include controlled diabetes, healthier blood pressure levels, improved cholesterol levels, improved heart and lung capacity, improved sleep, and better body composition. Everyone should speak with their doctor before  starting or changing an exercise routine.  Biomechanical Limitations Clinical staff conducted group or individual video education with verbal and written material and guidebook.  Patient learns how biomechanical limitations can impact exercise and how we can mitigate and possibly overcome limitations to have an impactful and balanced exercise routine.  Body Composition Clinical staff conducted group or individual video education with verbal and written material and guidebook.  Patient learns that body composition (ratio of muscle mass to fat mass) is a key component to assessing overall fitness, rather than body weight alone. Increased fat mass, especially visceral belly fat, can put us  at increased risk for metabolic syndrome, type 2 diabetes, heart disease, and even death. It is recommended to combine diet and exercise (cardiovascular and resistance training) to improve your body composition. Seek guidance from your physician and exercise physiologist before implementing an exercise routine.  Exercise Action Plan Clinical staff conducted group or individual video education with verbal and written material and guidebook.  Patient learns the  recommended strategies to achieve and enjoy long-term exercise adherence, including variety, self-motivation, self-efficacy, and positive decision making. Benefits of exercise include fitness, good health, weight management, more energy, better sleep, less stress, and overall well-being.  Medical   Heart Disease Risk Reduction Clinical staff conducted group or individual video education with verbal and written material and guidebook.  Patient learns our heart is our most vital organ as it circulates oxygen, nutrients, white blood cells, and hormones throughout the entire body, and carries waste away. Data supports a plant-based eating plan like the Pritikin Program for its effectiveness in slowing progression of and reversing heart disease. The video provides a number of recommendations to address heart disease.   Metabolic Syndrome and Belly Fat  Clinical staff conducted group or individual video education with verbal and written material and guidebook.  Patient learns what metabolic syndrome is, how it leads to heart disease, and how one can reverse it and keep it from coming back. You have metabolic syndrome if you have 3 of the following 5 criteria: abdominal obesity, high blood pressure, high triglycerides, low HDL cholesterol, and high blood sugar.  Hypertension and Heart Disease Clinical staff conducted group or individual video education with verbal and written material and guidebook.  Patient learns that high blood pressure, or hypertension, is very common in the [place] . Hypertension is largely due to excessive salt intake, but other important risk factors include being overweight, physical inactivity, drinking too much alcohol, smoking, and not eating enough potassium from fruits and vegetables. High blood pressure is a leading risk factor for heart attack, stroke, congestive heart failure, dementia, kidney failure, and premature death. Long-term effects of excessive salt intake include  stiffening of the arteries and thickening of heart muscle and organ damage. Recommendations include ways to reduce hypertension and the risk of heart disease.  Diseases of Our Time - Focusing on Diabetes Clinical staff conducted group or individual video education with verbal and written material and guidebook.  Patient learns why the best way to stop diseases of our time is prevention, through food and other lifestyle changes. Medicine (such as prescription pills and surgeries) is often only a Band-Aid on the problem, not a long-term solution. Most common diseases of our time include obesity, type 2 diabetes, hypertension, heart disease, and cancer. The Pritikin Program is recommended and has been proven to help reduce, reverse, and/or prevent the damaging effects of metabolic syndrome.  Nutrition   Overview of the Pritikin Eating Plan  Clinical staff  conducted group or individual video education with verbal and written material and guidebook.  Patient learns about the Pritikin Eating Plan for disease risk reduction. The Pritikin Eating Plan emphasizes a wide variety of unrefined, minimally-processed carbohydrates, like fruits, vegetables, whole grains, and legumes. Go, Caution, and Stop food choices are explained. Plant-based and lean animal proteins are emphasized. Rationale provided for low sodium intake for blood pressure control, low added sugars for blood sugar stabilization, and low added fats and oils for coronary artery disease risk reduction and weight management.  Calorie Density  Clinical staff conducted group or individual video education with verbal and written material and guidebook.  Patient learns about calorie density and how it impacts the Pritikin Eating Plan. Knowing the characteristics of the food you choose will help you decide whether those foods will lead to weight gain or weight loss, and whether you want to consume more or less of them. Weight loss is usually a side effect of  the Pritikin Eating Plan because of its focus on low calorie-dense foods.  Label Reading  Clinical staff conducted group or individual video education with verbal and written material and guidebook.  Patient learns about the Pritikin recommended label reading guidelines and corresponding recommendations regarding calorie density, added sugars, sodium content, and whole grains.  Dining Out - Part 1  Clinical staff conducted group or individual video education with verbal and written material and guidebook.  Patient learns that restaurant meals can be sabotaging because they can be so high in calories, fat, sodium, and/or sugar. Patient learns recommended strategies on how to positively address this and avoid unhealthy pitfalls.  Facts on Fats  Clinical staff conducted group or individual video education with verbal and written material and guidebook.  Patient learns that lifestyle modifications can be just as effective, if not more so, as many medications for lowering your risk of heart disease. A Pritikin lifestyle can help to reduce your risk of inflammation and atherosclerosis (cholesterol build-up, or plaque, in the artery walls). Lifestyle interventions such as dietary choices and physical activity address the cause of atherosclerosis. A review of the types of fats and their impact on blood cholesterol levels, along with dietary recommendations to reduce fat intake is also included.  Nutrition Action Plan  Clinical staff conducted group or individual video education with verbal and written material and guidebook.  Patient learns how to incorporate Pritikin recommendations into their lifestyle. Recommendations include planning and keeping personal health goals in mind as an important part of their success.  Healthy Mind-Set    Healthy Minds, Bodies, Hearts  Clinical staff conducted group or individual video education with verbal and written material and guidebook.  Patient learns how to  identify when they are stressed. Video will discuss the impact of that stress, as well as the many benefits of stress management. Patient will also be introduced to stress management techniques. The way we think, act, and feel has an impact on our hearts.  How Our Thoughts Can Heal Our Hearts  Clinical staff conducted group or individual video education with verbal and written material and guidebook.  Patient learns that negative thoughts can cause depression and anxiety. This can result in negative lifestyle behavior and serious health problems. Cognitive behavioral therapy is an effective method to help control our thoughts in order to change and improve our emotional outlook.  Additional Videos:  Exercise    Improving Performance  Clinical staff conducted group or individual video education with verbal and written material and guidebook.  Patient learns to use a non-linear approach by alternating intensity levels and lengths of time spent exercising to help burn more calories and lose more body fat. Cardiovascular exercise helps improve heart health, metabolism, hormonal balance, blood sugar control, and recovery from fatigue. Resistance training improves strength, endurance, balance, coordination, reaction time, metabolism, and muscle mass. Flexibility exercise improves circulation, posture, and balance. Seek guidance from your physician and exercise physiologist before implementing an exercise routine and learn your capabilities and proper form for all exercise.  Introduction to Yoga  Clinical staff conducted group or individual video education with verbal and written material and guidebook.  Patient learns about yoga, a discipline of the coming together of mind, breath, and body. The benefits of yoga include improved flexibility, improved range of motion, better posture and core strength, increased lung function, weight loss, and positive self-image. Yogas heart health benefits include lowered  blood pressure, healthier heart rate, decreased cholesterol and triglyceride levels, improved immune function, and reduced stress. Seek guidance from your physician and exercise physiologist before implementing an exercise routine and learn your capabilities and proper form for all exercise.  Medical   Aging: Enhancing Your Quality of Life  Clinical staff conducted group or individual video education with verbal and written material and guidebook.  Patient learns key strategies and recommendations to stay in good physical health and enhance quality of life, such as prevention strategies, having an advocate, securing a Health Care Proxy and Power of Attorney, and keeping a list of medications and system for tracking them. It also discusses how to avoid risk for bone loss.  Biology of Weight Control  Clinical staff conducted group or individual video education with verbal and written material and guidebook.  Patient learns that weight gain occurs because we consume more calories than we burn (eating more, moving less). Even if your body weight is normal, you may have higher ratios of fat compared to muscle mass. Too much body fat puts you at increased risk for cardiovascular disease, heart attack, stroke, type 2 diabetes, and obesity-related cancers. In addition to exercise, following the Pritikin Eating Plan can help reduce your risk.  Decoding Lab Results  Clinical staff conducted group or individual video education with verbal and written material and guidebook.  Patient learns that lab test reflects one measurement whose values change over time and are influenced by many factors, including medication, stress, sleep, exercise, food, hydration, pre-existing medical conditions, and more. It is recommended to use the knowledge from this video to become more involved with your lab results and evaluate your numbers to speak with your doctor.   Diseases of Our Time - Overview  Clinical staff conducted  group or individual video education with verbal and written material and guidebook.  Patient learns that according to the CDC, 50% to 70% of chronic diseases (such as obesity, type 2 diabetes, elevated lipids, hypertension, and heart disease) are avoidable through lifestyle improvements including healthier food choices, listening to satiety cues, and increased physical activity.  Sleep Disorders Clinical staff conducted group or individual video education with verbal and written material and guidebook.  Patient learns how good quality and duration of sleep are important to overall health and well-being. Patient also learns about sleep disorders and how they impact health along with recommendations to address them, including discussing with a physician.  Nutrition  Dining Out - Part 2 Clinical staff conducted group or individual video education with verbal and written material and guidebook.  Patient learns how to plan ahead  and communicate in order to maximize their dining experience in a healthy and nutritious manner. Included are recommended food choices based on the type of restaurant the patient is visiting.   Fueling a Banker conducted group or individual video education with verbal and written material and guidebook.  There is a strong connection between our food choices and our health. Diseases like obesity and type 2 diabetes are very prevalent and are in large-part due to lifestyle choices. The Pritikin Eating Plan provides plenty of food and hunger-curbing satisfaction. It is easy to follow, affordable, and helps reduce health risks.  Menu Workshop  Clinical staff conducted group or individual video education with verbal and written material and guidebook.  Patient learns that restaurant meals can sabotage health goals because they are often packed with calories, fat, sodium, and sugar. Recommendations include strategies to plan ahead and to communicate with the  manager, chef, or server to help order a healthier meal.  Planning Your Eating Strategy  Clinical staff conducted group or individual video education with verbal and written material and guidebook.  Patient learns about the Pritikin Eating Plan and its benefit of reducing the risk of disease. The Pritikin Eating Plan does not focus on calories. Instead, it emphasizes high-quality, nutrient-rich foods. By knowing the characteristics of the foods, we choose, we can determine their calorie density and make informed decisions.  Targeting Your Nutrition Priorities  Clinical staff conducted group or individual video education with verbal and written material and guidebook.  Patient learns that lifestyle habits have a tremendous impact on disease risk and progression. This video provides eating and physical activity recommendations based on your personal health goals, such as reducing LDL cholesterol, losing weight, preventing or controlling type 2 diabetes, and reducing high blood pressure.  Vitamins and Minerals  Clinical staff conducted group or individual video education with verbal and written material and guidebook.  Patient learns different ways to obtain key vitamins and minerals, including through a recommended healthy diet. It is important to discuss all supplements you take with your doctor.   Healthy Mind-Set    Smoking Cessation  Clinical staff conducted group or individual video education with verbal and written material and guidebook.  Patient learns that cigarette smoking and tobacco addiction pose a serious health risk which affects millions of people. Stopping smoking will significantly reduce the risk of heart disease, lung disease, and many forms of cancer. Recommended strategies for quitting are covered, including working with your doctor to develop a successful plan.  Culinary   Becoming a Set Designer conducted group or individual video education with verbal and  written material and guidebook.  Patient learns that cooking at home can be healthy, cost-effective, quick, and puts them in control. Keys to cooking healthy recipes will include looking at your recipe, assessing your equipment needs, planning ahead, making it simple, choosing cost-effective seasonal ingredients, and limiting the use of added fats, salts, and sugars.  Cooking - Breakfast and Snacks  Clinical staff conducted group or individual video education with verbal and written material and guidebook.  Patient learns how important breakfast is to satiety and nutrition through the entire day. Recommendations include key foods to eat during breakfast to help stabilize blood sugar levels and to prevent overeating at meals later in the day. Planning ahead is also a key component.  Cooking - Educational Psychologist conducted group or individual video education with verbal and written material and guidebook.  Patient learns eating strategies to improve overall health, including an approach to cook more at home. Recommendations include thinking of animal protein as a side on your plate rather than center stage and focusing instead on lower calorie dense options like vegetables, fruits, whole grains, and plant-based proteins, such as beans. Making sauces in large quantities to freeze for later and leaving the skin on your vegetables are also recommended to maximize your experience.  Cooking - Healthy Salads and Dressing Clinical staff conducted group or individual video education with verbal and written material and guidebook.  Patient learns that vegetables, fruits, whole grains, and legumes are the foundations of the Pritikin Eating Plan. Recommendations include how to incorporate each of these in flavorful and healthy salads, and how to create homemade salad dressings. Proper handling of ingredients is also covered. Cooking - Soups and [employer] - Soups and Desserts Clinical staff  conducted group or individual video education with verbal and written material and guidebook.  Patient learns that Pritikin soups and desserts make for easy, nutritious, and delicious snacks and meal components that are low in sodium, fat, sugar, and calorie density, while high in vitamins, minerals, and filling fiber. Recommendations include simple and healthy ideas for soups and desserts.   Overview     The Pritikin Solution Program Overview Clinical staff conducted group or individual video education with verbal and written material and guidebook.  Patient learns that the results of the Pritikin Program have been documented in more than 100 articles published in peer-reviewed journals, and the benefits include reducing risk factors for (and, in some cases, even reversing) high cholesterol, high blood pressure, type 2 diabetes, obesity, and more! An overview of the three key pillars of the Pritikin Program will be covered: eating well, doing regular exercise, and having a healthy mind-set.  WORKSHOPS  Exercise: Exercise Basics: Building Your Action Plan Clinical staff led group instruction and group discussion with PowerPoint presentation and patient guidebook. To enhance the learning environment the use of posters, models and videos may be added. At the conclusion of this workshop, patients will comprehend the difference between physical activity and exercise, as well as the benefits of incorporating both, into their routine. Patients will understand the FITT (Frequency, Intensity, Time, and Type) principle and how to use it to build an exercise action plan. In addition, safety concerns and other considerations for exercise and cardiac rehab will be addressed by the presenter. The purpose of this lesson is to promote a comprehensive and effective weekly exercise routine in order to improve patients overall level of fitness.   Managing Heart Disease: Your Path to a Healthier Heart Clinical  staff led group instruction and group discussion with PowerPoint presentation and patient guidebook. To enhance the learning environment the use of posters, models and videos may be added.At the conclusion of this workshop, patients will understand the anatomy and physiology of the heart. Additionally, they will understand how Pritikins three pillars impact the risk factors, the progression, and the management of heart disease.  The purpose of this lesson is to provide a high-level overview of the heart, heart disease, and how the Pritikin lifestyle positively impacts risk factors.  Exercise Biomechanics Clinical staff led group instruction and group discussion with PowerPoint presentation and patient guidebook. To enhance the learning environment the use of posters, models and videos may be added. Patients will learn how the structural parts of their bodies function and how these functions impact their daily activities, movement, and exercise. Patients will  learn how to promote a neutral spine, learn how to manage pain, and identify ways to improve their physical movement in order to promote healthy living. The purpose of this lesson is to expose patients to common physical limitations that impact physical activity. Participants will learn practical ways to adapt and manage aches and pains, and to minimize their effect on regular exercise. Patients will learn how to maintain good posture while sitting, walking, and lifting.  Balance Training and Fall Prevention  Clinical staff led group instruction and group discussion with PowerPoint presentation and patient guidebook. To enhance the learning environment the use of posters, models and videos may be added. At the conclusion of this workshop, patients will understand the importance of their sensorimotor skills (vision, proprioception, and the vestibular system) in maintaining their ability to balance as they age. Patients will apply a variety of  balancing exercises that are appropriate for their current level of function. Patients will understand the common causes for poor balance, possible solutions to these problems, and ways to modify their physical environment in order to minimize their fall risk. The purpose of this lesson is to teach patients about the importance of maintaining balance as they age and ways to minimize their risk of falling.  WORKSHOPS   Nutrition:  Fueling a Ship Broker led group instruction and group discussion with PowerPoint presentation and patient guidebook. To enhance the learning environment the use of posters, models and videos may be added. Patients will review the foundational principles of the Pritikin Eating Plan and understand what constitutes a serving size in each of the food groups. Patients will also learn Pritikin-friendly foods that are better choices when away from home and review make-ahead meal and snack options. Calorie density will be reviewed and applied to three nutrition priorities: weight maintenance, weight loss, and weight gain. The purpose of this lesson is to reinforce (in a group setting) the key concepts around what patients are recommended to eat and how to apply these guidelines when away from home by planning and selecting Pritikin-friendly options. Patients will understand how calorie density may be adjusted for different weight management goals.  Mindful Eating  Clinical staff led group instruction and group discussion with PowerPoint presentation and patient guidebook. To enhance the learning environment the use of posters, models and videos may be added. Patients will briefly review the concepts of the Pritikin Eating Plan and the importance of low-calorie dense foods. The concept of mindful eating will be introduced as well as the importance of paying attention to internal hunger signals. Triggers for non-hunger eating and techniques for dealing with triggers will be  explored. The purpose of this lesson is to provide patients with the opportunity to review the basic principles of the Pritikin Eating Plan, discuss the value of eating mindfully and how to measure internal cues of hunger and fullness using the Hunger Scale. Patients will also discuss reasons for non-hunger eating and learn strategies to use for controlling emotional eating.  Targeting Your Nutrition Priorities Clinical staff led group instruction and group discussion with PowerPoint presentation and patient guidebook. To enhance the learning environment the use of posters, models and videos may be added. Patients will learn how to determine their genetic susceptibility to disease by reviewing their family history. Patients will gain insight into the importance of diet as part of an overall healthy lifestyle in mitigating the impact of genetics and other environmental insults. The purpose of this lesson is to provide patients with the opportunity  to assess their personal nutrition priorities by looking at their family history, their own health history and current risk factors. Patients will also be able to discuss ways of prioritizing and modifying the Pritikin Eating Plan for their highest risk areas  Menu  Clinical staff led group instruction and group discussion with PowerPoint presentation and patient guidebook. To enhance the learning environment the use of posters, models and videos may be added. Using menus brought in from [employer], or printed from [employer], patients will apply the Pritikin dining out guidelines that were presented in the [employer] video. Patients will also be able to practice these guidelines in a variety of provided scenarios. The purpose of this lesson is to provide patients with the opportunity to practice hands-on learning of the Pritikin Dining Out guidelines with actual menus and practice scenarios.  Label Reading Clinical staff led group  instruction and group discussion with PowerPoint presentation and patient guidebook. To enhance the learning environment the use of posters, models and videos may be added. Patients will review and discuss the Pritikin label reading guidelines presented in Pritikins Label Reading Educational series video. Using fool labels brought in from local grocery stores and markets, patients will apply the label reading guidelines and determine if the packaged food meet the Pritikin guidelines. The purpose of this lesson is to provide patients with the opportunity to review, discuss, and practice hands-on learning of the Pritikin Label Reading guidelines with actual packaged food labels. Cooking School  Pritikins [employer] are designed to teach patients ways to prepare quick, simple, and affordable recipes at home. The importance of nutritions role in chronic disease risk reduction is reflected in its emphasis in the overall Pritikin program. By learning how to prepare essential core Pritikin Eating Plan recipes, patients will increase control over what they eat; be able to customize the flavor of foods without the use of added salt, sugar, or fat; and improve the quality of the food they consume. By learning a set of core recipes which are easily assembled, quickly prepared, and affordable, patients are more likely to prepare more healthy foods at home. These workshops focus on convenient breakfasts, simple entres, side dishes, and desserts which can be prepared with minimal effort and are consistent with nutrition recommendations for cardiovascular risk reduction. Cooking [employer] are taught by a armed forces logistics/support/administrative officer (RD) who has been trained by the [employer]. The chef or RD has a clear understanding of the importance of minimizing - if not completely eliminating - added fat, sugar, and sodium in recipes. Throughout the series of Cooking School Workshop sessions, patients  will learn about healthy ingredients and efficient methods of cooking to build confidence in their capability to prepare    Cooking School weekly topics:  Adding Flavor- Sodium-Free  Fast and Healthy Breakfasts  Powerhouse Plant-Based Proteins  Satisfying Salads and Dressings  Simple Sides and Sauces  International Cuisine-Spotlight on the [employer] Zones  Delicious Desserts  Savory Soups  [employer] - Meals in a Astronomer Appetizers and Snacks  Comforting Weekend Breakfasts  One-Pot Wonders   Fast Evening Meals  Landscape Architect Your Pritikin Plate  WORKSHOPS   Healthy Mindset (Psychosocial):  Focused Goals, Sustainable Changes Clinical staff led group instruction and group discussion with PowerPoint presentation and patient guidebook. To enhance the learning environment the use of posters, models and videos may be added. Patients will be able to apply effective goal setting strategies  to establish at least one personal goal, and then take consistent, meaningful action toward that goal. They will learn to identify common barriers to achieving personal goals and develop strategies to overcome them. Patients will also gain an understanding of how our mind-set can impact our ability to achieve goals and the importance of cultivating a positive and growth-oriented mind-set. The purpose of this lesson is to provide patients with a deeper understanding of how to set and achieve personal goals, as well as the tools and strategies needed to overcome common obstacles which may arise along the way.  From Head to Heart: The Power of a Healthy Outlook  Clinical staff led group instruction and group discussion with PowerPoint presentation and patient guidebook. To enhance the learning environment the use of posters, models and videos may be added. Patients will be able to recognize and describe the impact of emotions and mood on physical health. They will discover the importance  of self-care and explore self-care practices which may work for them. Patients will also learn how to utilize the 4 Cs to cultivate a healthier outlook and better manage stress and challenges. The purpose of this lesson is to demonstrate to patients how a healthy outlook is an essential part of maintaining good health, especially as they continue their cardiac rehab journey.  Healthy Sleep for a Healthy Heart Clinical staff led group instruction and group discussion with PowerPoint presentation and patient guidebook. To enhance the learning environment the use of posters, models and videos may be added. At the conclusion of this workshop, patients will be able to demonstrate knowledge of the importance of sleep to overall health, well-being, and quality of life. They will understand the symptoms of, and treatments for, common sleep disorders. Patients will also be able to identify daytime and nighttime behaviors which impact sleep, and they will be able to apply these tools to help manage sleep-related challenges. The purpose of this lesson is to provide patients with a general overview of sleep and outline the importance of quality sleep. Patients will learn about a few of the most common sleep disorders. Patients will also be introduced to the concept of sleep hygiene, and discover ways to self-manage certain sleeping problems through simple daily behavior changes. Finally, the workshop will motivate patients by clarifying the links between quality sleep and their goals of heart-healthy living.   Recognizing and Reducing Stress Clinical staff led group instruction and group discussion with PowerPoint presentation and patient guidebook. To enhance the learning environment the use of posters, models and videos may be added. At the conclusion of this workshop, patients will be able to understand the types of stress reactions, differentiate between acute and chronic stress, and recognize the impact that  chronic stress has on their health. They will also be able to apply different coping mechanisms, such as reframing negative self-talk. Patients will have the opportunity to practice a variety of stress management techniques, such as deep abdominal breathing, progressive muscle relaxation, and/or guided imagery.  The purpose of this lesson is to educate patients on the role of stress in their lives and to provide healthy techniques for coping with it.  Learning Barriers/Preferences:  Learning Barriers/Preferences - 06/02/24 0951       Learning Barriers/Preferences   Learning Barriers Sight;Hearing   bilateral hearing aids   Learning Preferences Audio;Computer/Internet;Group Instruction;Individual Instruction;Pictoral;Skilled Demonstration;Verbal Instruction;Video;Written Material          Education Topics:  Knowledge Questionnaire Score:  Knowledge Questionnaire Score - 06/02/24 9048  Knowledge Questionnaire Score   Pre Score 24/24          Core Components/Risk Factors/Patient Goals at Admission:  Personal Goals and Risk Factors at Admission - 06/02/24 0951       Core Components/Risk Factors/Patient Goals on Admission    Weight Management Yes;Weight Loss    Intervention Weight Management: Develop a combined nutrition and exercise program designed to reach desired caloric intake, while maintaining appropriate intake of nutrient and fiber, sodium and fats, and appropriate energy expenditure required for the weight goal.;Weight Management: Provide education and appropriate resources to help participant work on and attain dietary goals.;Weight Management/Obesity: Establish reasonable short term and long term weight goals.    Expected Outcomes Short Term: Continue to assess and modify interventions until short term weight is achieved;Long Term: Adherence to nutrition and physical activity/exercise program aimed toward attainment of established weight goal;Weight Loss: Understanding  of general recommendations for a balanced deficit meal plan, which promotes 1-2 lb weight loss per week and includes a negative energy balance of [phone] kcal/d;Understanding recommendations for meals to include 15-35% energy as protein, 25-35% energy from fat, 35-60% energy from carbohydrates, less than 200mg  of dietary cholesterol, 20-35 gm of total fiber daily;Understanding of distribution of calorie intake throughout the day with the consumption of 4-5 meals/snacks    Diabetes Yes    Intervention Provide education about signs/symptoms and action to take for hypo/hyperglycemia.;Provide education about proper nutrition, including hydration, and aerobic/resistive exercise prescription along with prescribed medications to achieve blood glucose in normal ranges: Fasting glucose 65-99 mg/dL    Expected Outcomes Short Term: Participant verbalizes understanding of the signs/symptoms and immediate care of hyper/hypoglycemia, proper foot care and importance of medication, aerobic/resistive exercise and nutrition plan for blood glucose control.;Long Term: Attainment of HbA1C < 7%.    Hypertension Yes    Intervention Provide education on lifestyle modifcations including regular physical activity/exercise, weight management, moderate sodium restriction and increased consumption of fresh fruit, vegetables, and low fat dairy, alcohol moderation, and smoking cessation.;Monitor prescription use compliance.    Expected Outcomes Short Term: Continued assessment and intervention until BP is < 140/32mm HG in hypertensive participants. < 130/47mm HG in hypertensive participants with diabetes, heart failure or chronic kidney disease.;Long Term: Maintenance of blood pressure at goal levels.    Lipids Yes    Intervention Provide education and support for participant on nutrition & aerobic/resistive exercise along with prescribed medications to achieve LDL 70mg , HDL >40mg .    Expected Outcomes Short Term: Participant states  understanding of desired cholesterol values and is compliant with medications prescribed. Participant is following exercise prescription and nutrition guidelines.;Long Term: Cholesterol controlled with medications as prescribed, with individualized exercise RX and with personalized nutrition plan. Value goals: LDL < 70mg , HDL > 40 mg.          Core Components/Risk Factors/Patient Goals Review:    Core Components/Risk Factors/Patient Goals at Discharge (Final Review):    ITP Comments:  ITP Comments     Row Name 06/02/24 0935           ITP Comments Wilbert Bihari, MD: Medical Director.  Introduction to the Pritikin Education Program/Intensive Cardiac Rehab. Initial orientation packet reviewed with the patient.          Comments: Participant attended orientation for the cardiac rehabilitation program on  06/02/2024  to perform initial intake and exercise walk test. Patient introduced to the Pritikin Program education and orientation packet was reviewed. Completed 6-minute walk test, measurements, initial ITP, and exercise  prescription. Vital signs stable. Telemetry-normal sinus rhythm with occasional PVC's. Pt did experience an Absent Seizure after the walk test. Pt has history of this. Pt was seen by on-site provider; Tessa Conte-PA-C. Documentation of this event is noted in EPIC. Pt discharged stable with vitals WNL.   Service time was from 8:11 to 12:05.         [1]  Current Outpatient Medications:    Accu-Chek Softclix Lancets lancets, 4 (four) times daily., Disp: , Rfl:    acetaminophen  (TYLENOL ) 500 MG tablet, Take 500-1,000 mg by mouth every 6 (six) hours as needed for headache or moderate pain (pain score 4-6)., Disp: , Rfl:    amitriptyline  (ELAVIL ) 10 MG tablet, Take 1 tablet (10 mg total) by mouth at bedtime., Disp: 90 tablet, Rfl: 3   aspirin  EC 81 MG tablet, Take 1 tablet (81 mg total) by mouth daily. Swallow whole., Disp: , Rfl:    Cholecalciferol (VITAMIN D) 50 MCG  (2000 UT) tablet, Take 2,000 Units by mouth daily., Disp: , Rfl:    clopidogrel  (PLAVIX ) 75 MG tablet, Take 1 tablet (75 mg total) by mouth daily., Disp: 90 tablet, Rfl: 1   Continuous Glucose Sensor (GUARDIAN 4 GLUCOSE SENSOR) MISC, by Does not apply route., Disp: , Rfl:    cycloSPORINE (RESTASIS) 0.05 % ophthalmic emulsion, Place 1 drop into both eyes 2 (two) times daily., Disp: , Rfl:    empagliflozin (JARDIANCE) 10 MG TABS tablet, Take 10 mg by mouth daily., Disp: , Rfl:    furosemide  (LASIX ) 20 MG tablet, Take 0.5 tablets (10 mg total) by mouth daily as needed for edema (Leg swelling)., Disp: 30 tablet, Rfl: 1   Glucagon  1 MG/0.2ML SOAJ, Inject 1 mg into the skin as needed (low blood sugar)., Disp: 0.2 mL, Rfl: 0   glucose blood (ONETOUCH VERIO) test strip, 1 each by Other route 4 (four) times daily -  before meals and at bedtime. dexcom 6, Disp: , Rfl:    Insulin  Human (INSULIN  PUMP) SOLN, Inject into the skin., Disp: , Rfl:    levothyroxine  (SYNTHROID ) 50 MCG tablet, Take 50 mcg by mouth daily before breakfast., Disp: , Rfl:    lisinopril  (ZESTRIL ) 10 MG tablet, Take 1 tablet (10 mg total) by mouth daily., Disp: 90 tablet, Rfl: 3   Magnesium Oxide 420 MG TABS, Take 420 mg by mouth 2 (two) times daily., Disp: , Rfl:    Multiple Vitamins-Minerals (ICAPS AREDS 2 PO), Take 1 capsule by mouth in the morning and at bedtime., Disp: , Rfl:    nitroGLYCERIN  (NITROSTAT ) 0.4 MG SL tablet, Place 1 tablet (0.4 mg total) under the tongue every 5 (five) minutes as needed for chest pain., Disp: 25 tablet, Rfl: 3   pantoprazole  (PROTONIX ) 40 MG tablet, Take 1 tablet (40 mg total) by mouth daily., Disp: , Rfl:    PRESCRIPTION MEDICATION, Medication  Study for cholesterol, injection on [address] with Dr. Every eight weeks Last visit 02/26- next appointment [facility], Disp: , Rfl:    rosuvastatin  (CRESTOR ) 40 MG tablet, Take 1 tablet (40 mg total) by mouth at bedtime., Disp: 90 tablet, Rfl: 3    Semaglutide , 1 MG/DOSE, (OZEMPIC , 1 MG/DOSE,) 2 MG/1.5ML SOPN, Inject 2 mg into the skin every Monday., Disp: , Rfl:    sodium bicarbonate 650 MG tablet, Take 650 mg by mouth 2 (two) times daily., Disp: , Rfl:    tamsulosin  (FLOMAX ) 0.4 MG CAPS capsule, Take 1 capsule (0.4 mg total) by mouth daily.,  Disp: 30 capsule, Rfl: 0   topiramate  (TOPAMAX ) 100 MG tablet, Take 1 tablet (100 mg total) by mouth 2 (two) times daily., Disp: 180 tablet, Rfl: 3 [2]  Social History Tobacco Use  Smoking Status Never  Smokeless Tobacco Never

## 2024-06-02 NOTE — Progress Notes (Addendum)
 Patient here for cardiac rehab orientation. Having frequent PVC's asymptomatic. Blood pressure 110/66. Telemetry rhythm, Sinus rate in the 70's. Oxygen saturation 95% on room air. Lower extremity edema present 1+. Lung fields essentially clear upon auscultation. Patient reports having increased shortness of breath over the past 30 days has had to take furosemide  daily instead of as needed. Denies having chest pain today. Weight today 92.6 kg. Will consult with onsite provider [name] PAC. Discussed with onsite provider [name] PAC. Orren came in and spoke with the patient. Upon assessment findings. Tessa instructed Mr Gartrell to take 40 mg of furosemide  for 3 days and the back to previous dose. Patient instructed to weigh daily and to notify Dr Godfrey office of weight gain or increased shortness of breath. Patient and wife states understanding. Per [name] [facility] patient is okay to proceed with 6 minute walk test and exercise at cardiac rehab. Will forward today's documentation to [name] for review.Hadassah Elpidio Quan RN BSN

## 2024-06-02 NOTE — Progress Notes (Signed)
 Cardiac Rehab Medication Review   Does the patient  feel that his/her medications are working for him/her?  yes  Has the patient been experiencing any side effects to the medications prescribed?  yes  Does the patient measure his/her own blood pressure or blood glucose at home?  yes   Does the patient have any problems obtaining medications due to transportation or finances?   no  Understanding of regimen: good Understanding of indications: good Potential of compliance: good    Comments: Some loss of appetite after taking Ozempic ; usually lasts about 2 days. Pt relies on spouse for medications.    Jeff [name] 06/02/2024 9:24 AM

## 2024-06-03 ENCOUNTER — Telehealth (HOSPITAL_COMMUNITY): Payer: Self-pay | Admitting: *Deleted

## 2024-06-03 NOTE — Progress Notes (Signed)
 Patient previously had event that our epileptologist Dr. Gregg witnessed and did not believe these were epileptic.  With recent event, his blood pressure was quite low which raises concern for more hypotensive etiology vs epileptic. Will forward to Dr. Rosemarie and Dr. Ladona for input. Thank you.

## 2024-06-03 NOTE — Telephone Encounter (Signed)
 No noted needed duplicate

## 2024-06-05 ENCOUNTER — Other Ambulatory Visit: Payer: Self-pay | Admitting: Cardiology

## 2024-06-05 DIAGNOSIS — R600 Localized edema: Secondary | ICD-10-CM

## 2024-06-08 ENCOUNTER — Encounter (HOSPITAL_COMMUNITY)

## 2024-06-10 ENCOUNTER — Encounter (HOSPITAL_COMMUNITY)

## 2024-06-12 ENCOUNTER — Encounter (HOSPITAL_COMMUNITY)

## 2024-06-15 ENCOUNTER — Encounter (HOSPITAL_COMMUNITY)

## 2024-06-17 ENCOUNTER — Encounter (HOSPITAL_COMMUNITY)

## 2024-06-19 ENCOUNTER — Encounter (HOSPITAL_COMMUNITY)

## 2024-06-22 ENCOUNTER — Encounter (HOSPITAL_COMMUNITY)

## 2024-06-24 ENCOUNTER — Encounter (HOSPITAL_COMMUNITY)

## 2024-06-26 ENCOUNTER — Encounter (HOSPITAL_COMMUNITY)

## 2024-06-29 ENCOUNTER — Encounter (HOSPITAL_COMMUNITY)

## 2024-06-30 ENCOUNTER — Telehealth (HOSPITAL_COMMUNITY): Payer: Self-pay | Admitting: *Deleted

## 2024-06-30 NOTE — Telephone Encounter (Signed)
 Left message to call cardiac rehab. Exercise remains on hold until cleared by cardiology to return.Hadassah Elpidio Quan RN BSN

## 2024-07-01 ENCOUNTER — Encounter (HOSPITAL_COMMUNITY): Payer: Self-pay | Admitting: *Deleted

## 2024-07-01 ENCOUNTER — Encounter (HOSPITAL_COMMUNITY): Admission: RE | Admit: 2024-07-01 | Source: Ambulatory Visit

## 2024-07-01 DIAGNOSIS — Z955 Presence of coronary angioplasty implant and graft: Secondary | ICD-10-CM

## 2024-07-01 NOTE — Progress Notes (Addendum)
 Attempted to contact Jeff [name] in regards to attending cardiac rehab. Will discharge from cardiac rehab at this time. Jeff [name] attended orientation only on 06/02/24.Hadassah Elpidio Quan RN BSN

## 2024-07-03 ENCOUNTER — Encounter (HOSPITAL_COMMUNITY)

## 2024-07-06 ENCOUNTER — Encounter (HOSPITAL_COMMUNITY)

## 2024-07-08 ENCOUNTER — Encounter (HOSPITAL_COMMUNITY)

## 2024-07-10 ENCOUNTER — Encounter (HOSPITAL_COMMUNITY)

## 2024-07-13 ENCOUNTER — Encounter (HOSPITAL_COMMUNITY)

## 2024-07-15 ENCOUNTER — Encounter (HOSPITAL_COMMUNITY)

## 2024-07-17 ENCOUNTER — Encounter (HOSPITAL_COMMUNITY)

## 2024-07-20 ENCOUNTER — Encounter (HOSPITAL_COMMUNITY)

## 2024-07-22 ENCOUNTER — Encounter (HOSPITAL_COMMUNITY)

## 2024-07-24 ENCOUNTER — Encounter (HOSPITAL_COMMUNITY)

## 2024-07-27 ENCOUNTER — Encounter (HOSPITAL_COMMUNITY)

## 2024-07-29 ENCOUNTER — Encounter (HOSPITAL_COMMUNITY)

## 2024-07-31 ENCOUNTER — Encounter (HOSPITAL_COMMUNITY)

## 2024-08-03 ENCOUNTER — Encounter (HOSPITAL_COMMUNITY)

## 2024-08-05 ENCOUNTER — Encounter (HOSPITAL_COMMUNITY)

## 2024-08-07 ENCOUNTER — Encounter (HOSPITAL_COMMUNITY)

## 2024-08-10 ENCOUNTER — Encounter (HOSPITAL_COMMUNITY)

## 2024-08-12 ENCOUNTER — Encounter (HOSPITAL_COMMUNITY)

## 2024-08-14 ENCOUNTER — Encounter (HOSPITAL_COMMUNITY)

## 2024-08-17 ENCOUNTER — Encounter (HOSPITAL_COMMUNITY)

## 2024-08-19 ENCOUNTER — Encounter (HOSPITAL_COMMUNITY)

## 2024-08-21 ENCOUNTER — Encounter (HOSPITAL_COMMUNITY)

## 2024-08-24 ENCOUNTER — Encounter (HOSPITAL_COMMUNITY)

## 2024-08-26 ENCOUNTER — Encounter (HOSPITAL_COMMUNITY)

## 2024-09-15 ENCOUNTER — Ambulatory Visit: Admitting: Cardiology

## 2024-09-30 ENCOUNTER — Ambulatory Visit: Admitting: Adult Health

## 2024-11-11 ENCOUNTER — Encounter (INDEPENDENT_AMBULATORY_CARE_PROVIDER_SITE_OTHER): Admitting: Ophthalmology
